# Patient Record
Sex: Male | Born: 1975 | Race: White | Hispanic: No | Marital: Single | Smoking: Current every day smoker
Health system: Southern US, Community
[De-identification: ages and names within clinical notes are randomized; demographics above are authoritative.]

## PROBLEM LIST (undated history)

## (undated) DIAGNOSIS — F419 Anxiety disorder, unspecified: Secondary | ICD-10-CM

## (undated) DIAGNOSIS — S2231XA Fracture of one rib, right side, initial encounter for closed fracture: Secondary | ICD-10-CM

## (undated) DIAGNOSIS — M549 Dorsalgia, unspecified: Secondary | ICD-10-CM

## (undated) DIAGNOSIS — I10 Essential (primary) hypertension: Secondary | ICD-10-CM

## (undated) HISTORY — DX: Fracture of one rib, right side, initial encounter for closed fracture: S22.31XA

---

## 2003-01-15 ENCOUNTER — Emergency Department (HOSPITAL_COMMUNITY): Admission: EM | Admit: 2003-01-15 | Discharge: 2003-01-15 | Payer: Self-pay | Admitting: *Deleted

## 2003-06-19 ENCOUNTER — Ambulatory Visit (HOSPITAL_COMMUNITY): Admission: RE | Admit: 2003-06-19 | Discharge: 2003-06-19 | Payer: Self-pay | Admitting: Internal Medicine

## 2003-06-24 ENCOUNTER — Ambulatory Visit (HOSPITAL_COMMUNITY): Admission: RE | Admit: 2003-06-24 | Discharge: 2003-06-24 | Payer: Self-pay | Admitting: Internal Medicine

## 2003-07-15 ENCOUNTER — Ambulatory Visit (HOSPITAL_COMMUNITY): Admission: RE | Admit: 2003-07-15 | Discharge: 2003-07-15 | Payer: Self-pay | Admitting: Internal Medicine

## 2007-06-13 ENCOUNTER — Inpatient Hospital Stay (HOSPITAL_COMMUNITY): Admission: EM | Admit: 2007-06-13 | Discharge: 2007-06-18 | Payer: Self-pay | Admitting: Emergency Medicine

## 2007-06-18 ENCOUNTER — Ambulatory Visit: Payer: Self-pay | Admitting: Psychiatry

## 2007-06-18 ENCOUNTER — Inpatient Hospital Stay (HOSPITAL_COMMUNITY): Admission: AD | Admit: 2007-06-18 | Discharge: 2007-06-19 | Payer: Self-pay | Admitting: Psychiatry

## 2008-11-25 ENCOUNTER — Emergency Department (HOSPITAL_COMMUNITY): Admission: EM | Admit: 2008-11-25 | Discharge: 2008-11-25 | Payer: Self-pay | Admitting: Emergency Medicine

## 2010-09-20 LAB — CBC
Hemoglobin: 15.7 g/dL (ref 13.0–17.0)
MCHC: 35.4 g/dL (ref 30.0–36.0)
MCV: 99.6 fL (ref 78.0–100.0)
Platelets: 236 10*3/uL (ref 150–400)
RBC: 4.45 MIL/uL (ref 4.22–5.81)
WBC: 6.2 10*3/uL (ref 4.0–10.5)

## 2010-09-20 LAB — DIFFERENTIAL
Basophils Absolute: 0 10*3/uL (ref 0.0–0.1)
Basophils Relative: 0 % (ref 0–1)
Eosinophils Absolute: 0.1 10*3/uL (ref 0.0–0.7)
Eosinophils Relative: 2 % (ref 0–5)
Lymphocytes Relative: 21 % (ref 12–46)
Lymphs Abs: 1.3 10*3/uL (ref 0.7–4.0)
Monocytes Relative: 7 % (ref 3–12)
Neutro Abs: 4.3 10*3/uL (ref 1.7–7.7)
Neutrophils Relative %: 70 % (ref 43–77)

## 2010-09-20 LAB — COMPREHENSIVE METABOLIC PANEL
ALT: 28 U/L (ref 0–53)
AST: 24 U/L (ref 0–37)
Alkaline Phosphatase: 91 U/L (ref 39–117)
CO2: 25 mEq/L (ref 19–32)
Calcium: 9.6 mg/dL (ref 8.4–10.5)
Chloride: 104 mEq/L (ref 96–112)
Creatinine, Ser: 0.89 mg/dL (ref 0.4–1.5)
GFR calc non Af Amer: >60 mL/min (ref >60)
Glucose, Bld: 112 mg/dL — ABNORMAL HIGH (ref 70–99)
Total Bilirubin: 1 mg/dL (ref 0.3–1.2)
Total Protein: 6.4 g/dL (ref 6.0–8.3)

## 2010-09-20 LAB — POCT CARDIAC MARKERS
Myoglobin, poc: 34 ng/mL (ref 12–200)
Troponin i, poc: <0.05 ng/mL (ref 0.00–0.09)

## 2010-10-26 NOTE — H&P (Signed)
NAME:  [name] [name], [name] [name]              ACCOUNT NO.:  [id number]   MEDICAL RECORD NO.:  [id number]          PATIENT TYPE:  INP   LOCATION:  IC03                          FACILITY:  [facility]   PHYSICIAN:  Dorris Singh, DO    DATE OF BIRTH:  12/18/1975   DATE OF ADMISSION:  06/12/2007  DATE OF DISCHARGE:  LH                              HISTORY & PHYSICAL   HISTORY OF PRESENT ILLNESS:  The patient is a 35 year old male who  presented to the emergency room via EMS with the chief complaint of  altered mental status and possible drug overdose.  The patient's history  is that he has family members, particularly his father, who is dying of  cancer and may only have one week to live.  Basically, he has been  taking Xanax which he has been getting off the street to help with  anxiety several times a day.  His girlfriend/fiance states that she has  not seen him actually take any of the medication.  He would leave the  room and actually take some.  He has a history of taking a street Xanax  maybe once a month per her recollection.  When the patient arrived via  EMS, he had a Caveat of 5 because he was unresponsive and had altered  mental status.  While in the ICU, his girlfriend answered most of his  medical questions.   PAST MEDICAL HISTORY:  Unknown.   SOCIAL HISTORY:  As mentioned before in the HPI.   ALLERGIES:  Unknown.   MEDICATIONS:  Currently, other than taking Xanax, dose is unknown at  this point in time.   HOSPITAL COURSE:  While he was in the emergency room, the patient  started to vomit.  He was unable to protect his airway.  Rapid response  and resuscitation except for CPR was implemented.  He was then  intubated, and he came back with a positive toxicology screen of opiates  and benzodiazepines.  Per family, he has had problems with substance  abuse in the past.  He did not improve with the Narcan on route.  He was  then intubated to protect his airway.  He also aspirated.  He  had large  amounts of food in his stomach, and there is a great concern that he may  have aspirated a large amount of this into his lungs due to his  inability of protecting his airway.   PHYSICAL EXAMINATION:  VITAL SIGNS:  Heart rate 100, blood pressure  94/62, respirations on ventilator are 16.  GENERAL:  Well-developed, well-nourished, unresponsive, 35 year old  Caucasian male.  HEENT:  Normocephalic, atraumatic.  Eyes:  Pupils are not reactive.  There is no scleral icterus or conjunctival discharge.  Ears, Nose,  Mouth and Throat are within normal limits.  NECK:  Supple, no lymphadenopathy.  HEART:  Regular rate and rhythm.  No murmurs, rubs or gallops.  RESPIRATIONS:  Currently, the patient is on the ventilator.  ABDOMEN:  Soft, nontender, nondistended.  Bowel sounds present in all  four quadrants.  EXTREMITIES:  Full range of motion.  Pulses in all  four quadrants.  No  edema noted.  NEUROLOGICAL:  Unable to assess.  SKIN:  No visibile rash at this point in time.   STUDIES:  On the tests that were done, he had EKG which showed atrial  fibrillation with rapid ventricular response.  Axis was normal.  PQRS  was within normal limits.  There is no prior EKG.  CT of head study was  somewhat limited with motion artifact.  No intracranial abnormality.  His ABG:  A ph was 7.2, PCO2 68.2, PO2 65, bicarbonate 25.9.  He had two  of these done.  His pH initially was 7.1, so there was improvement in  his next one.  PCO2 80.7, PO2 123, bicarbonate 25.8.  His salicylate  level was less than 4, alcohol level less than 5.  Chemistry is  essentially normal except for his glucose which was 253 and his total  bilirubin which was 1.4.  His acetaminophen level was less than 10.  His  cardiac markers were within normal limits.  His drug screen was positive  for opiates and benzodiazepines.  His urine was within normal limits  except for an elevated glucose of 250.  White count 13.1, hemoglobin   15.5, hematocrit 46.1, platelets 290.   ASSESSMENT/PLAN:  1. Respiratory arrest secondary to benzodiazepines and opiate      overdose.  2. Hyperglycemia.  3. Possible aspiration pneumonia.   PLAN:  Admit the patient to the ICU to the service of [facility].  Will  obtain daily CBC, B-met and ABGs.  Will consult Dr. Juanetta Gosling to consult  and participate regarding possible aspiration pneumonia and respiratory  distress.  Will have the ACT Team come see the patient once he is  extubated.  Will place the patient on patient protocol.  Will also given  him breathing treatments q.4 h around the clock.  Will watch his blood  sugars.  Will keep him n.p.o. and may decide to use supplemental  nutrition if he is on the ventilator longer than 48 hours.  Will  continue to monitor the patient.  The patient currently has a fiance,  but she is not his wife.  His next of kin is his mother.  She is  available to reach by phone if there are any questions that need to be  answered, she is the best person at this point in time to answer them,  and also for any legal decisions that need to be made, she needs to be  contact.  Her number is [phone].      Dorris Singh, DO  Electronically Signed     CB/MEDQ  D:  06/13/2007  T:  06/13/2007  Job:  [id number]

## 2010-10-26 NOTE — Group Therapy Note (Signed)
NAME:  Nie, Ezeriah              ACCOUNT NO.:  [id number]   MEDICAL RECORD NO.:  [id number]          PATIENT TYPE:  INP   LOCATION:  IC03                          FACILITY:  [facility]   PHYSICIAN:  Edward L. Juanetta Gosling, M.D.DATE OF BIRTH:  12/31/1975   DATE OF PROCEDURE:  DATE OF DISCHARGE:                                 PROGRESS NOTE   Mr. Cantave was attempted to be weaned today but it did not work.  He  has no new complaints.  His physical examination shows that his chest is  still with some rhonchi.  His heart is regular.  He has a lot of  secretions.  His blood gas is adequate but he has so much secretions, I  do not think we can safely extubate him today and I have told him that.  So the plan is to continue with his meds and treatments and follow.      Edward L. Juanetta Gosling, M.D.  Electronically Signed     ELH/MEDQ  D:  06/14/2007  T:  06/14/2007  Job:  [id number]

## 2010-10-26 NOTE — Group Therapy Note (Signed)
NAME:  [name], [name]              ACCOUNT NO.:  [id number]   MEDICAL RECORD NO.:  [id number]          PATIENT TYPE:  INP   LOCATION:  IC03                          FACILITY:  [facility]   PHYSICIAN:  Edward L. Juanetta Gosling, M.D.DATE OF BIRTH:  1976-02-14   DATE OF PROCEDURE:  06/15/2007  DATE OF DISCHARGE:                                 PROGRESS NOTE   [name] [name] has done okay.  He is awake.  He still has significant  secretion which may be a problem.  His physical exam otherwise shows  that he is alert.  His O2 saturation is 96%.  Heart rate is 112, blood  pressure 143/72, respirations are 20.  His pupils are reactive.  His  chest with significant rhonchi bilaterally.  His white count is 13,000,  hemoglobin 11.7, platelets 192.  Comprehensive metabolic profile:  BUN  is 10, creatinine is 0.81, albumin is 3.  His blood gas on 40%, 600,  rate of 18, 5 of PEEP, pO2 is 68, pCO2 of 37, pH 7.42.  Chest x-ray  shows improving right lower lobe.   ASSESSMENT:  He has respiratory failure on the basis of a drug overdose  and he has almost certainly aspiration pneumonia.  He is being treated  and may be able to come off the ventilator today.      Edward L. Juanetta Gosling, M.D.  Electronically Signed     ELH/MEDQ  D:  06/15/2007  T:  06/15/2007  Job:  [id number]
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
## 2010-10-26 NOTE — H&P (Signed)
NAME:  [name] [name], [name] [name]              ACCOUNT NO.:  [id number]   MEDICAL RECORD NO.:  [id number]          PATIENT TYPE:  IPS   LOCATION:  0500                          FACILITY:  [facility]   PHYSICIAN:  Geoffery Lyons, M.D.      DATE OF BIRTH:  04-20-1976   DATE OF ADMISSION:  06/18/2007  DATE OF DISCHARGE:  06/19/2007                       PSYCHIATRIC ADMISSION ASSESSMENT   IDENTIFYING INFORMATION:  This is a 35 year old single white male.  This  is an involuntary admission.   HISTORY OF PRESENT ILLNESS:  This patient presented sedated with altered  mental status and respiratory compromise to the emergency room and was  admitted to the medical unit on June 12, 2007 to June 18, 2007.  He reports that he had been at a friend's house, had drank several  beers, developed a headache, took one pain pill, nothing happened and  took a few more pain pills.  Additionally that night he took possibly 3-  4 tablets of Xanax, dose unknown.  Remembers going home with his fiancee  and does not remember anything after that.  He was admitted on June 12, 2007 with respiratory arrest secondary to benzodiazepine and opiate  overdose.  Did require ventilator support and was also diagnosed with  some aspiration pneumonia.  The patient admits that he has been having  some grief over his father but reports that the overdose was entirely  accidental.  Does admit to some casual drug use typically using 2-3  peach colored Xanax per week, usually to get to sleep at night,  drinking beers once or twice a week.  Does not use opiates on a regular  basis.   While hospitalized the patient's father died 06-15-07.  The  patient does endorse quite a bit of grief over his father's death and  has been closely involved in the father's care since the father was  diagnosed with lung cancer in March 2008.  The patient is requesting  discharge to attend his father's funeral today.  Denies any active  suicidal  thoughts.  Denies homicidal thoughts and is willing to consider  counseling and has voiced his intention to remain off substances.   PAST PSYCHIATRIC HISTORY:  First psychiatric inpatient stay.  He has no  prior history of treatment for substance abuse or depression or mood  disorder.  No history of a previous history of blackouts, seizures or  brain injury.  Denies a history of learning disabilities.  Denies any  history of psychotropics.   SOCIAL HISTORY:  Single white male, never married.  No children.  Currently searching for work doing full-time employment.  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  Father deceased June 14, 2022 2009 after being diagnosed with lung cancer in March 2008.  Hospice has  been involved with the family.  The patient has lived at home with his  parents.  His brother also incidentally died a little more than a year  ago due to obesity and cardiac problems.  The patient has no legal  problems.   FAMILY HISTORY:  Denies family  history of mental illness or substance  abuse.   MEDICAL HISTORY:  The patient is currently followed by Dr. Regino Schultze, his  primary care physician, and during hospitalization was seen by Dr. Shaune Pollack, pulmonologist.   MEDICAL PROBLEMS:  Status post benzodiazepine and opiate overdose with  ventilator dependent respiratory failure, aspiration pneumonia.   MEDICATIONS:  Levaquin 750 mg for three more days.   DRUG ALLERGIES:  NONE.   POSITIVE PHYSICAL FINDINGS:  Physical exam done in the emergency room is  noted in the record.  Generally well-nourished, well-developed but at  the time of admission he was an unresponsive 35 year old male.  Notably  urine drug screen was positive for benzodiazepines and opiates.  His  salicylate level was less than 4, alcohol level less than 5 and glucose  was at the time of being checked 253 mg percent which normalized later.  Acetaminophen level was less than 10.  Platelets were  normal at 290,000.  Please refer to the history and physical done by Dr. Dorris Singh at  [facility].  On admission to our unit here he is  fully alert, afebrile with normal vital signs, up and ambulatory and  completely oriented.   MENTAL STATUS EXAM:  A fully alert male, cooperative, completely  oriented.  Affect is appropriate, somewhat blunted.  He is in full  contact with reality.  Cognition fully preserved.  Speech is normal in  pace, tone and amount, production, articulate.  Thinking is linear and  no signs of psychosis or confusion.  Gives a coherent history.  Has been  candid about his social issues with some casual use of Xanax and opiates  along with alcohol.  Mood is neutral.  He has no dangerous thoughts  today.  Regrets what happened.  Is pledging to abstain from illicit  substances.  Requesting to be discharged so that he can attend to his  father's funeral and assist his mother.  We have had a conference with  his mother participating here in person.  She reports that she has never  known him to have any suicidal thoughts.  He has been helpful at home,  never been dangerous in any way.  She would like him home and is in  agreement that she will ensure that he gets to counseling.   AXIS I: Benzodiazepine abuse.  Rule out dependence.  Polysubstance  abuse.  AXIS II: Deferred.  AXIS III: Status post benzodiazepine and opiate overdose, ventilator  dependent respiratory failure resolved and aspiration pneumonia.  AXIS IV: Severe grief issues.  AXIS V: Current 47, past year 39.   PLAN:  To discharge the patient today.  He will go home with his mother  and attend his father's funeral tomorrow.  We are making a referral to  hospice of [facility] for grief counseling and the patient is in  agreement with this plan. He has voiced his commitment to remain  abstinent from street drugs and we have also instructed him to follow up  with Dr. Regino Schultze for  any fever that may occur in the next couple of  weeks.   DISCHARGE MEDICATIONS:  Levaquin 750 mg daily for January 7th and  January 8th and no other medications at this time.   DISCHARGE DIAGNOSIS:  As noted above.      Margaret A. Scott, N.P.      Geoffery Lyons, M.D.  Electronically Signed    MAS/MEDQ  D:  06/19/2007  T:  06/19/2007  Job:  [phone] [id number]
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
## 2010-10-26 NOTE — Discharge Summary (Signed)
NAME:  [name] [name], [name] [name]              ACCOUNT NO.:  [id number]   MEDICAL RECORD NO.:  [id number]          PATIENT TYPE:  INP   LOCATION:  A211                          FACILITY:  [facility]   PHYSICIAN:  Osvaldo Shipper, MD     DATE OF BIRTH:  1976-03-09   DATE OF ADMISSION:  06/13/2007  DATE OF DISCHARGE:  01/05/2009LH                               DISCHARGE SUMMARY   ADDENDUM:  Basically patient was awaiting transfer to a psychiatric  facility to be examined and evaluated by a psychiatrist.  Please review  my dictated discharge summary from yesterday for further details.  Patient continued to do well for the one extra day that he stayed here  at [facility]. This morning his potassium was a little bit low, which we  replaced.  However, I was told by the nurse after the patient went that  this order was never taken off so she was going to attempt to reach the  providers at [facility] to see if they could give him the extra  dose of potassium there.  In any case, he did well, he was more amenable  to being transferred for evaluation.  His mother was also in his room,  and she was also calm and more collected this time when I saw her.  Examination was unremarkable.  He was continuing to saturate 95%, his  lungs were clear, and he continued to be medically stable for discharge.  He was given a prescription for Levaquin 750 mg for three more days.      Osvaldo Shipper, MD  Electronically Signed     GK/MEDQ  D:  06/18/2007  T:  06/19/2007  Job:  [id number]

## 2010-10-26 NOTE — Group Therapy Note (Signed)
NAME:  Mcandrew, Abdikadir              ACCOUNT NO.:  [id number]   MEDICAL RECORD NO.:  [id number]          PATIENT TYPE:  INP   LOCATION:  A211                          FACILITY:  [facility]   PHYSICIAN:  Edward L. Juanetta Gosling, M.D.DATE OF BIRTH:  09-07-1975   DATE OF PROCEDURE:  06/17/2007  DATE OF DISCHARGE:                                 PROGRESS NOTE   ASSESSMENT AND PLAN:  Mr. Jablonsky looks very good and I am going to  plan to sign off at this point.   Thanks for allowing me to see him with you.   Sincerely,      Edward L. Juanetta Gosling, M.D.  Electronically Signed     ELH/MEDQ  D:  06/17/2007  T:  06/17/2007  Job:  [id number]

## 2010-10-29 NOTE — Op Note (Signed)
NAME:  [name] [name], [name] [name]                        ACCOUNT NO.:  [id number]   MEDICAL RECORD NO.:  [id number]                   PATIENT TYPE:  AMB   LOCATION:  DAY                                  FACILITY:  [facility]   PHYSICIAN:  Lionel December, M.D.                 DATE OF BIRTH:  [birth year]   DATE OF PROCEDURE:  06/19/2003  DATE OF DISCHARGE:                                 OPERATIVE REPORT   PROCEDURE:  Total colonoscopy.   ENDOSCOPIST:  Lionel December, M.D.   INDICATIONS:  This patient is a 35 year old Caucasian male with chronic  constipation and left-sided abdominal pain and intermittent hematochezia who  was recently noted to have heme-positive stool.  He also has anorexia and a  25-pound weight loss. I feel that his weight loss is secondary to anxiety  and depression. He did have lab studies through [facility].  His  CBC and TSH were normal.  His ALT was 43 which is slightly elevated.  He is  undergoing diagnostic colonoscopy.   The procedure and risks were reviewed with the patient and informed consent  was obtained.   PREOPERATIVE MEDICATIONS:  Demerol 50 mg IV and Versed 8 mg IV in divided  dose.   FINDINGS:  Procedure performed in endoscopy suite.  The patient's vital  signs and O2 saturation were monitored during the procedure and remained  stable.  The patient was placed in the left lateral recumbent position and  rectal examination was performed.  No external abnormality noted, but on  digital exam he was quite tender, but there was no stricture.   Olympus videoscope was placed in the rectum and advanced under vision into  the sigmoid colon and beyond.  Preparation was satisfactory.  Scope was  passed to the cecum which was identified by appendiceal orifice and the  ileocecal valve.  As the scope was withdrawn, colonic mucosa was carefully  examined and was normal throughout.  Rectal mucosa similarly was normal.   The scope was retroflexed to examine the  anorectal junction and hemorrhoids  were noted below the dentate line, but there was no obvious tear. The  endoscope was straightened and withdrawn.  The patient tolerated the  procedure well.   FINAL DIAGNOSIS:  External hemorrhoids, otherwise normal colonoscopy.   RECOMMENDATIONS:  1. He should continue high fiber diet and Metamucil 1 tablespoonful daily.     He can titrate the dose of MiraLax to maybe 10 gm daily or every other     day.  The goal is for him to have at least 2 or 3 bowel movements a week.  2. Will check his LFTs today along with a CBC and sedimentation rate.  3. I would like for him to make an appointment to see Dr. Regino Schultze to address     the issue of his stress disorder and possibly depression.  I will be  contacting the patient with biopsy results and will plan to see him back     in 6-8 weeks.      ___________________________________________                                            Lionel December, M.D.   NR/MEDQ  D:  06/19/2003  T:  06/19/2003  Job:  [id number]   cc:   Kirk Ruths, M.D.  [address]  [place]  [place] [address]  Fax: [phone]   Barbaraann Barthel, M.D.  [address]  [place]  [place] [address]  Fax: [phone]
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
## 2010-10-29 NOTE — Group Therapy Note (Signed)
NAME:  [name] [name], [name] [name]              ACCOUNT NO.:  [id number]   MEDICAL RECORD NO.:  [id number]          PATIENT TYPE:  INP   LOCATION:  A211                          FACILITY:  [facility]   PHYSICIAN:  Edward L. Juanetta Gosling, M.D.DATE OF BIRTH:  [birth year]   DATE OF PROCEDURE:  06/15/2006  DATE OF DISCHARGE:                                 PROGRESS NOTE   SUBJECTIVE:  [name] [name] was able to be extubated yesterday and seems  to be doing well.  He has no new complaints.  He is still coughing and  still congested.   ASSESSMENT AND PLAN:  The situation with his depression may be getting  worse because his father did die in the last a day or 2.  There are  funeral plans underway, and so forth.  At this point, I think  considering his exam, and so forth, he is okay to move from the  intensive care unit.      Edward L. Juanetta Gosling, M.D.  Electronically Signed     ELH/MEDQ  D:  06/17/2007  T:  06/17/2007  Job:  [id number]

## 2011-03-02 LAB — BLOOD GAS, ARTERIAL
Acid-Base Excess: 0.1
Acid-Base Excess: 0.2
Acid-Base Excess: 0.8
Acid-Base Excess: 1
Bicarbonate: 24
Bicarbonate: 24.3 — ABNORMAL HIGH
Bicarbonate: 25.4 — ABNORMAL HIGH
Bicarbonate: 25.6 — ABNORMAL HIGH
FIO2: 40
FIO2: 40
FIO2: 40
MECHVT: 600
MECHVT: 650
Mode: POSITIVE
O2 Content: 3
O2 Content: 40
O2 Content: 40
O2 Saturation: 93.8
O2 Saturation: 95.1
O2 Saturation: 95.3
O2 Saturation: 96.6
PEEP: 5
PEEP: 5
PEEP: 5
Patient temperature: 37
Patient temperature: 37
Patient temperature: 37
Patient temperature: 37
RATE: 18
RATE: 18
TCO2: 21.7
TCO2: 21.8
TCO2: 22.9
TCO2: 23.2
pCO2 arterial: 37.5
pCO2 arterial: 39.4
pCO2 arterial: 42.7
pCO2 arterial: 45.7 — ABNORMAL HIGH
pH, Arterial: 7.366
pH, Arterial: 7.392
pH, Arterial: 7.407
pH, Arterial: 7.422
pO2, Arterial: 68.1 — ABNORMAL LOW
pO2, Arterial: 73.7 — ABNORMAL LOW
pO2, Arterial: 78.5 — ABNORMAL LOW
pO2, Arterial: 86

## 2011-03-02 LAB — DIFFERENTIAL
Basophils Absolute: 0
Basophils Absolute: 0
Basophils Absolute: 0
Basophils Relative: 0
Basophils Relative: 0
Basophils Relative: 0
Eosinophils Absolute: 0
Eosinophils Absolute: 0
Eosinophils Absolute: 0.2
Eosinophils Relative: 0
Eosinophils Relative: 0
Eosinophils Relative: 2
Lymphocytes Relative: 11 — ABNORMAL LOW
Lymphocytes Relative: 7 — ABNORMAL LOW
Lymphocytes Relative: 9 — ABNORMAL LOW
Lymphs Abs: 1
Lymphs Abs: 1.2
Lymphs Abs: 1.3
Monocytes Absolute: 1
Monocytes Absolute: 1
Monocytes Absolute: 1.1 — ABNORMAL HIGH
Monocytes Relative: 8
Monocytes Relative: 8
Monocytes Relative: 8
Neutro Abs: 10.8 — ABNORMAL HIGH
Neutro Abs: 11.8 — ABNORMAL HIGH
Neutro Abs: 9.2 — ABNORMAL HIGH
Neutrophils Relative %: 79 — ABNORMAL HIGH
Neutrophils Relative %: 83 — ABNORMAL HIGH
Neutrophils Relative %: 85 — ABNORMAL HIGH

## 2011-03-02 LAB — URINALYSIS, ROUTINE W REFLEX MICROSCOPIC
Bilirubin Urine: NEGATIVE
Ketones, ur: NEGATIVE
Protein, ur: NEGATIVE
Urobilinogen, UA: >8 — ABNORMAL HIGH

## 2011-03-02 LAB — BASIC METABOLIC PANEL
BUN: 11
BUN: 5 — ABNORMAL LOW
BUN: 6
BUN: 8
CO2: 25
CO2: 26
CO2: 26
CO2: 27
Calcium: 8.6
Calcium: 8.9
Calcium: 9
Calcium: 9.4
Chloride: 107
Chloride: 109
Chloride: 111
Chloride: 111
Creatinine, Ser: 0.75
Creatinine, Ser: 0.8
Creatinine, Ser: 0.86
Creatinine, Ser: 0.91
GFR calc Af Amer: >60
GFR calc Af Amer: >60
GFR calc Af Amer: >60
GFR calc Af Amer: >60
GFR calc non Af Amer: >60
GFR calc non Af Amer: >60
GFR calc non Af Amer: >60
GFR calc non Af Amer: >60
Glucose, Bld: 102 — ABNORMAL HIGH
Glucose, Bld: 109 — ABNORMAL HIGH
Glucose, Bld: 110 — ABNORMAL HIGH
Glucose, Bld: 114 — ABNORMAL HIGH
Potassium: 3.3 — ABNORMAL LOW
Potassium: 3.4 — ABNORMAL LOW
Potassium: 3.5
Potassium: 3.8
Sodium: 140
Sodium: 141
Sodium: 142
Sodium: 143

## 2011-03-02 LAB — COMPREHENSIVE METABOLIC PANEL
ALT: 21
AST: 18
Albumin: 3 — ABNORMAL LOW
Alkaline Phosphatase: 54
BUN: 10
CO2: 25
Calcium: 8.9
Chloride: 110
Creatinine, Ser: 0.81
GFR calc Af Amer: >60
GFR calc non Af Amer: >60
Glucose, Bld: 104 — ABNORMAL HIGH
Potassium: 3.6
Sodium: 142
Total Bilirubin: 3 — ABNORMAL HIGH
Total Protein: 5.6 — ABNORMAL LOW

## 2011-03-02 LAB — CBC
HCT: 34.5 — ABNORMAL LOW
HCT: 37.2 — ABNORMAL LOW
HCT: 38.2 — ABNORMAL LOW
Hemoglobin: 11.7 — ABNORMAL LOW
Hemoglobin: 12.4 — ABNORMAL LOW
Hemoglobin: 12.9 — ABNORMAL LOW
MCHC: 33.4
MCHC: 33.8
MCHC: 33.9
MCV: 98.1
MCV: 98.2
MCV: 98.3
Platelets: 192
Platelets: 209
Platelets: 231
RBC: 3.52 — ABNORMAL LOW
RBC: 3.78 — ABNORMAL LOW
RBC: 3.89 — ABNORMAL LOW
RDW: 13.3
RDW: 13.5
RDW: 13.5
WBC: 11.8 — ABNORMAL HIGH
WBC: 13 — ABNORMAL HIGH
WBC: 13.9 — ABNORMAL HIGH

## 2011-03-02 LAB — URINE MICROSCOPIC-ADD ON

## 2011-03-18 LAB — COMPREHENSIVE METABOLIC PANEL
ALT: 42
AST: 33
Albumin: 4.3
Alkaline Phosphatase: 63
CO2: 28
Calcium: 8.5
Chloride: 108
Creatinine, Ser: 1.04
Glucose, Bld: 146 — ABNORMAL HIGH
Glucose, Bld: 253 — ABNORMAL HIGH
Potassium: 3.7
Potassium: 4.5
Sodium: 140
Sodium: 140
Total Bilirubin: 1.4 — ABNORMAL HIGH
Total Protein: 5.6 — ABNORMAL LOW
Total Protein: 6.8

## 2011-03-18 LAB — URINE CULTURE
Colony Count: NO GROWTH
Culture: NO GROWTH

## 2011-03-18 LAB — POCT CARDIAC MARKERS
CKMB, poc: 1.2
Myoglobin, poc: 83.3
Operator id: 216221

## 2011-03-18 LAB — BLOOD GAS, ARTERIAL
Acid-Base Excess: 1.1
Acid-base deficit: 0
Acid-base deficit: 1.4
Acid-base deficit: 2.7 — ABNORMAL HIGH
Bicarbonate: 24.1 — ABNORMAL HIGH
Bicarbonate: 25.9 — ABNORMAL HIGH
Bicarbonate: 27.8 — ABNORMAL HIGH
FIO2: 0.7
FIO2: 100
FIO2: 100
FIO2: 100
O2 Saturation: 89.3
O2 Saturation: 99.5
PEEP: 5
PEEP: 5
PEEP: 5
RATE: 12
RATE: 18
TCO2: 21.5
pCO2 arterial: 44.9
pCO2 arterial: 80.7
pO2, Arterial: 122 — ABNORMAL HIGH
pO2, Arterial: 123 — ABNORMAL HIGH
pO2, Arterial: 261 — ABNORMAL HIGH
pO2, Arterial: 65.5 — ABNORMAL LOW

## 2011-03-18 LAB — DIFFERENTIAL
Eosinophils Relative: 3
Lymphocytes Relative: 31
Lymphs Abs: 4.1 — ABNORMAL HIGH
Monocytes Absolute: 1
Monocytes Relative: 8
Neutro Abs: 7.6

## 2011-03-18 LAB — URINALYSIS, ROUTINE W REFLEX MICROSCOPIC
Bilirubin Urine: NEGATIVE
Ketones, ur: NEGATIVE
Nitrite: NEGATIVE
Protein, ur: NEGATIVE
pH: 6.5

## 2011-03-18 LAB — CBC
HCT: 46.1
Hemoglobin: 15.5
MCHC: 33.5
RBC: 4.7

## 2011-03-18 LAB — RAPID URINE DRUG SCREEN, HOSP PERFORMED
Amphetamines: NOT DETECTED
Benzodiazepines: POSITIVE — AB
Cocaine: NOT DETECTED
Opiates: POSITIVE — AB
Tetrahydrocannabinol: NOT DETECTED

## 2011-03-18 LAB — APTT: aPTT: 26

## 2011-03-18 LAB — ACETAMINOPHEN LEVEL: Acetaminophen (Tylenol), Serum: <10 — ABNORMAL LOW

## 2011-04-16 ENCOUNTER — Emergency Department (HOSPITAL_COMMUNITY): Payer: Self-pay

## 2011-04-16 ENCOUNTER — Emergency Department (HOSPITAL_COMMUNITY)
Admission: EM | Admit: 2011-04-16 | Discharge: 2011-04-16 | Disposition: A | Discharge status: Home / Self Care | Payer: Self-pay | Attending: Emergency Medicine | Admitting: Emergency Medicine

## 2011-04-16 ENCOUNTER — Other Ambulatory Visit: Payer: Self-pay

## 2011-04-16 DIAGNOSIS — R110 Nausea: Secondary | ICD-10-CM | POA: Insufficient documentation

## 2011-04-16 DIAGNOSIS — R000 Tachycardia, unspecified: Secondary | ICD-10-CM | POA: Insufficient documentation

## 2011-04-16 DIAGNOSIS — F419 Anxiety disorder, unspecified: Secondary | ICD-10-CM

## 2011-04-16 DIAGNOSIS — R0602 Shortness of breath: Secondary | ICD-10-CM | POA: Insufficient documentation

## 2011-04-16 DIAGNOSIS — R259 Unspecified abnormal involuntary movements: Secondary | ICD-10-CM | POA: Insufficient documentation

## 2011-04-16 DIAGNOSIS — R079 Chest pain, unspecified: Secondary | ICD-10-CM | POA: Insufficient documentation

## 2011-04-16 DIAGNOSIS — I10 Essential (primary) hypertension: Secondary | ICD-10-CM | POA: Insufficient documentation

## 2011-04-16 DIAGNOSIS — F17200 Nicotine dependence, unspecified, uncomplicated: Secondary | ICD-10-CM | POA: Insufficient documentation

## 2011-04-16 DIAGNOSIS — F411 Generalized anxiety disorder: Secondary | ICD-10-CM | POA: Insufficient documentation

## 2011-04-16 HISTORY — DX: Essential (primary) hypertension: I10

## 2011-04-16 HISTORY — DX: Anxiety disorder, unspecified: F41.9

## 2011-04-16 LAB — CBC
HCT: 49.9 % (ref 39.0–52.0)
MCHC: 33.9 g/dL (ref 30.0–36.0)
MCV: 98.2 fL (ref 78.0–100.0)
RDW: 13.6 % (ref 11.5–15.5)
WBC: 6.8 10*3/uL (ref 4.0–10.5)

## 2011-04-16 LAB — BASIC METABOLIC PANEL
BUN: 10 mg/dL (ref 6–23)
Chloride: 99 mEq/L (ref 96–112)
Creatinine, Ser: 0.91 mg/dL (ref 0.50–1.35)
GFR calc Af Amer: >90 mL/min (ref >90)
Glucose, Bld: 121 mg/dL — ABNORMAL HIGH (ref 70–99)

## 2011-04-16 MED ORDER — LORAZEPAM 1 MG PO TABS: 1.0000 mg | ORAL_TABLET | Freq: Three times a day (TID) | ORAL | Status: AC | PRN

## 2011-04-16 MED ORDER — IOHEXOL 300 MG/ML  SOLN
100.0000 mL | Freq: Once | INTRAMUSCULAR | Status: AC | PRN
  Administered 2011-04-16: 100 mL via INTRAVENOUS

## 2011-04-16 MED ORDER — SODIUM CHLORIDE 0.9 % IV BOLUS (SEPSIS)
1000.0000 mL | Freq: Once | INTRAVENOUS | Status: AC
  Administered 2011-04-16: 1000 mL via INTRAVENOUS

## 2011-04-16 NOTE — ED Notes (Signed)
Pt currently denies CP. States his heart was racing earlier and that he could see his heart beat through his shirt. States his brother died of a heart attack close to the same age. NAD at this time. Pt appears anxious. NAD. SO at bedside.

## 2011-04-16 NOTE — ED Provider Notes (Signed)
History   This chart was scribed for Dayton Bailiff, MD by Clarita Crane. The patient was seen in room APA04/APA04 and the patient's care was started at 5:07PM.   CSN: [id number] Arrival date & time: 04/16/2011  4:48 PM   First MD Initiated Contact with Patient 04/16/11 1700      Chief Complaint  Patient presents with  . Chest Pain  . Tachycardia  . Shortness of Breath  . Dizziness  . Nausea  . Hypertension    HPI [name] [name] is a 35 y.o. male who presents to the Emergency Department complaining of constant moderate non-radaiting chest pain with associated SOB, palpitations, dizziness, nausea and tremor of bilateral hands onset several minutes just prior to arrival but currently resolved. Patient reports having h/o anxiety which is currently controlled with Xanax. States he took Xanax 30 mins ago with symptoms gradually improving since. Patient denies elicit drug use today but notes he injected oxycodone into left forearm several days ago. Patient is a current smoker and has a h/o hypertension.  Reports similar sx in past  PCP- [facility]  Past Medical History  Diagnosis Date  . Hypertension   . Anxiety     History reviewed. No pertinent past surgical history.  History reviewed. No pertinent family history.  History  Substance Use Topics  . Smoking status: Current Everyday Smoker -- 0.5 packs/day  . Smokeless tobacco: Not on file  . Alcohol Use: Yes     occ      Review of Systems 10 Systems reviewed and are negative for acute change except as noted in the HPI.  Allergies  Review of patient's allergies indicates no known allergies.  Home Medications   Current Outpatient Rx  Name Route Sig Dispense Refill  . ALPRAZOLAM 1 MG PO TABS Oral Take 1 mg by mouth daily.      . OXYCODONE HCL 5 MG PO TABS Oral Take 5 mg by mouth daily.      Marland Kitchen LORAZEPAM 1 MG PO TABS Oral Take 1 tablet (1 mg total) by mouth 3 (three) times daily as needed for anxiety. 15 tablet 0    BP  144/81  Pulse 71  Temp(Src) 97.3 F (36.3 C) (Oral)  Resp 16  Ht 6' (1.829 m)  Wt 190 lb (86.183 kg)  BMI 25.77 kg/m2  SpO2 100%  Physical Exam  Nursing note and vitals reviewed. Constitutional: He is oriented to person, place, and time. He appears well-developed and well-nourished. No distress.  HENT:  Head: Normocephalic and atraumatic.  Eyes: Conjunctivae and EOM are normal. Pupils are equal, round, and reactive to light.  Neck: Phonation normal. Neck supple. No tracheal deviation present.  Cardiovascular: Normal rate, regular rhythm and normal heart sounds.   Pulmonary/Chest: Effort normal and breath sounds normal. No respiratory distress. He exhibits no bony tenderness.  Abdominal: Soft. Normal appearance. He exhibits no distension.  Musculoskeletal: Normal range of motion. He exhibits no edema.       Bruise noted to left forearm from patient reported injection.   Neurological: He is alert and oriented to person, place, and time. He has normal strength. No sensory deficit.  Skin: Skin is warm, dry and intact.  Psychiatric: He has a normal mood and affect. His behavior is normal.    ED Course  Procedures (including critical care time)  DIAGNOSTIC STUDIES: Oxygen Saturation is 100% on room air, normal by my interpretation.    COORDINATION OF CARE: 6:11PM- Patient informed of current lab and imaging  results and intent to obtain additional imaging to rule out blood clot.    Date: 04/16/2011  Rate: 96  Rhythm: normal sinus rhythm and with sinus arrhythmia  QRS Axis: normal  Intervals: normal  ST/T Wave abnormalities: normal  Conduction Disutrbances:none  Narrative Interpretation:   Old EKG Reviewed: unchanged  Labs Reviewed  BASIC METABOLIC PANEL - Abnormal; Notable for the following:    Glucose, Bld 121 (*)    Calcium 11.0 (*)    All other components within normal limits  D-DIMER, QUANTITATIVE - Abnormal; Notable for the following:    D-Dimer, Quant 1.31 (*)     All other components within normal limits  CBC  POCT I-STAT TROPONIN I   Dg Chest 2 View  04/16/2011  *RADIOLOGY REPORT*  Clinical Data: Chest pain  CHEST - 2 VIEW  Comparison: 06/16/2007  Findings: Artifact overlies the chest.  Heart size is normal. Mediastinal shadows are normal.  Lungs are clear.  No effusions. No significant bony findings.  IMPRESSION: Normal chest  Original Report Authenticated By: Thomasenia Sales, M.D.   Ct Angio Chest W/cm &/or Wo Cm  04/16/2011  *RADIOLOGY REPORT*  Clinical Data:  Shortness of breath.  Positive D-dimer.  CT ANGIOGRAPHY CHEST WITH CONTRAST  Technique:  Multidetector CT imaging of the chest was performed using the standard protocol during bolus administration of intravenous contrast.  Multiplanar CT image reconstructions including MIPs were obtained to evaluate the vascular anatomy.  Contrast: OMNIPAQUE IOHEXOL 300 MG/ML IV SOLN  Comparison:  Chest radiography same day  Findings:  Pulmonary arterial opacification is excellent.  There are no filling defects.  No aortic pathology is seen.  No coronary artery calcification.  No mediastinal or hilar mass or adenopathy. No pleural or pericardial fluid.  No pulmonary infiltrate, mass or collapse.  There are a few small areas of emphysema.  No significant bony finding.  Review of the MIP images confirms the above findings.  IMPRESSION: No pulmonary emboli.  No active chest disease.  Few small areas of emphysema.  Original Report Authenticated By: Thomasenia Sales, M.D.     1. Chest pain   2. Anxiety       MDM  Patient's chest pain and overall symptom complex is consistent with anxiety or panic attack. I'm not concerned about ACS is completely atypical. Therefore a troponin was not sent. He was tachycardic on arrival with the complaint of shortness of breath and chest pain therefore I performed a d-dimer as I could not use the rule out criteria. A d-dimer was elevated therefore he received a CT angioma which was  negative for PE. Remainder of his workup was unremarkable. His symptoms resolved after taking his Xanax at home prior to arrival in the emergency department. Patient remained asymptomatic one emergency department. He has limited access to his primary care physician therefore I will prescribe a limited prescription of Ativan. This      I personally performed the services described in this documentation, which was scribed in my presence. The recorded information has been reviewed and considered.   Dayton Bailiff, MD 04/16/11 [phone]

## 2011-04-16 NOTE — ED Notes (Signed)
MD at bedside. 

## 2011-04-16 NOTE — ED Notes (Signed)
Pt presents with chest pain, SOB, tachycardia, headache, and nausea. Pt states symptoms started yesterday. Resp even and unlabored. NAD at this time.

## 2012-09-13 ENCOUNTER — Encounter (HOSPITAL_COMMUNITY): Payer: Self-pay | Admitting: *Deleted

## 2012-09-13 ENCOUNTER — Emergency Department (HOSPITAL_COMMUNITY)
Admission: EM | Admit: 2012-09-13 | Discharge: 2012-09-13 | Disposition: A | Discharge status: Home / Self Care | Payer: Self-pay | Attending: Emergency Medicine | Admitting: Emergency Medicine

## 2012-09-13 DIAGNOSIS — T401X1A Poisoning by heroin, accidental (unintentional), initial encounter: Secondary | ICD-10-CM

## 2012-09-13 DIAGNOSIS — I10 Essential (primary) hypertension: Secondary | ICD-10-CM | POA: Insufficient documentation

## 2012-09-13 DIAGNOSIS — F17200 Nicotine dependence, unspecified, uncomplicated: Secondary | ICD-10-CM | POA: Insufficient documentation

## 2012-09-13 DIAGNOSIS — T401X4A Poisoning by heroin, undetermined, initial encounter: Secondary | ICD-10-CM | POA: Insufficient documentation

## 2012-09-13 DIAGNOSIS — Z79899 Other long term (current) drug therapy: Secondary | ICD-10-CM | POA: Insufficient documentation

## 2012-09-13 DIAGNOSIS — Y939 Activity, unspecified: Secondary | ICD-10-CM | POA: Insufficient documentation

## 2012-09-13 DIAGNOSIS — Y929 Unspecified place or not applicable: Secondary | ICD-10-CM | POA: Insufficient documentation

## 2012-09-13 DIAGNOSIS — R000 Tachycardia, unspecified: Secondary | ICD-10-CM | POA: Insufficient documentation

## 2012-09-13 DIAGNOSIS — F411 Generalized anxiety disorder: Secondary | ICD-10-CM | POA: Insufficient documentation

## 2012-09-13 MED ORDER — SODIUM CHLORIDE 0.9 % IV BOLUS (SEPSIS)
500.0000 mL | Freq: Once | INTRAVENOUS | Status: AC
  Administered 2012-09-13: 500 mL via INTRAVENOUS

## 2012-09-13 MED ORDER — SODIUM CHLORIDE 0.9 % IV SOLN
INTRAVENOUS | Status: DC
  Administered 2012-09-13: 1000 mL via INTRAVENOUS

## 2012-09-13 NOTE — ED Notes (Signed)
Pt denies SI/HI 
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
## 2012-09-13 NOTE — ED Notes (Signed)
Pt states he has been using heroin today. Pt was initially naganol for ~ 5 min, needing ventilation with BVM. Pt woke up upon insertion of IO. Pt alert and oriented on arrival, able to answer questions appropriately during triage.

## 2012-09-13 NOTE — ED Notes (Signed)
Pt given urinal at this time 

## 2012-09-13 NOTE — ED Notes (Signed)
Pt alert & oriented x4, stable gait. Patient given discharge instructions, paperwork & prescription(s). Patient  instructed to stop at the registration desk to finish any additional paperwork. Patient verbalized understanding. Pt left department w/ no further questions. 

## 2012-09-13 NOTE — ED Notes (Signed)
Pt alert talking w/ family. Pt remains on cardiac monitor w/ NIBP monitoring. NAD noted.

## 2013-05-18 ENCOUNTER — Emergency Department (HOSPITAL_COMMUNITY): Payer: Self-pay

## 2013-05-18 ENCOUNTER — Inpatient Hospital Stay (HOSPITAL_COMMUNITY)
Admission: EM | Admit: 2013-05-18 | Discharge: 2013-05-19 | DRG: 195 | Disposition: A | Discharge status: Home / Self Care | Payer: Self-pay | Attending: Internal Medicine | Admitting: Internal Medicine

## 2013-05-18 ENCOUNTER — Encounter (HOSPITAL_COMMUNITY): Payer: Self-pay | Admitting: Emergency Medicine

## 2013-05-18 DIAGNOSIS — I10 Essential (primary) hypertension: Secondary | ICD-10-CM | POA: Diagnosis present

## 2013-05-18 DIAGNOSIS — R000 Tachycardia, unspecified: Secondary | ICD-10-CM | POA: Diagnosis present

## 2013-05-18 DIAGNOSIS — F17200 Nicotine dependence, unspecified, uncomplicated: Secondary | ICD-10-CM | POA: Diagnosis present

## 2013-05-18 DIAGNOSIS — J189 Pneumonia, unspecified organism: Principal | ICD-10-CM | POA: Diagnosis present

## 2013-05-18 DIAGNOSIS — R0600 Dyspnea, unspecified: Secondary | ICD-10-CM

## 2013-05-18 DIAGNOSIS — J40 Bronchitis, not specified as acute or chronic: Secondary | ICD-10-CM | POA: Diagnosis present

## 2013-05-18 DIAGNOSIS — F411 Generalized anxiety disorder: Secondary | ICD-10-CM | POA: Diagnosis present

## 2013-05-18 DIAGNOSIS — Z720 Tobacco use: Secondary | ICD-10-CM | POA: Diagnosis present

## 2013-05-18 DIAGNOSIS — I498 Other specified cardiac arrhythmias: Secondary | ICD-10-CM | POA: Diagnosis present

## 2013-05-18 LAB — CBC WITH DIFFERENTIAL/PLATELET
Basophils Absolute: 0 10*3/uL (ref 0.0–0.1)
Eosinophils Relative: 0 % (ref 0–5)
HCT: 45.3 % (ref 39.0–52.0)
Hemoglobin: 15.3 g/dL (ref 13.0–17.0)
Lymphocytes Relative: 6 % — ABNORMAL LOW (ref 12–46)
Lymphs Abs: 0.7 10*3/uL (ref 0.7–4.0)
MCV: 99.1 fL (ref 78.0–100.0)
Monocytes Absolute: 0.5 10*3/uL (ref 0.1–1.0)
Monocytes Relative: 4 % (ref 3–12)
RDW: 13.4 % (ref 11.5–15.5)
WBC: 11.7 10*3/uL — ABNORMAL HIGH (ref 4.0–10.5)

## 2013-05-18 LAB — BASIC METABOLIC PANEL
BUN: 6 mg/dL (ref 6–23)
CO2: 27 mEq/L (ref 19–32)
Calcium: 9.8 mg/dL (ref 8.4–10.5)
Chloride: 102 mEq/L (ref 96–112)
Creatinine, Ser: 0.87 mg/dL (ref 0.50–1.35)
Glucose, Bld: 131 mg/dL — ABNORMAL HIGH (ref 70–99)

## 2013-05-18 MED ORDER — ALBUTEROL SULFATE (5 MG/ML) 0.5% IN NEBU
5.0000 mg | INHALATION_SOLUTION | Freq: Once | RESPIRATORY_TRACT | Status: AC
Start: 2013-05-18 — End: 2013-05-18
  Administered 2013-05-18: 5 mg via RESPIRATORY_TRACT
  Filled 2013-05-18: qty 1

## 2013-05-18 MED ORDER — IOHEXOL 350 MG/ML SOLN
100.0000 mL | Freq: Once | INTRAVENOUS | Status: AC | PRN
  Administered 2013-05-18: 100 mL via INTRAVENOUS

## 2013-05-18 MED ORDER — AZITHROMYCIN 250 MG PO TABS
500.0000 mg | ORAL_TABLET | Freq: Once | ORAL | Status: AC
  Administered 2013-05-18: 500 mg via ORAL
  Filled 2013-05-18: qty 2

## 2013-05-18 MED ORDER — DEXTROSE 5 % IV SOLN
1.0000 g | Freq: Once | INTRAVENOUS | Status: AC
  Administered 2013-05-18: 1 g via INTRAVENOUS
  Filled 2013-05-18: qty 10

## 2013-05-18 MED ORDER — IPRATROPIUM BROMIDE 0.02 % IN SOLN
0.5000 mg | Freq: Once | RESPIRATORY_TRACT | Status: AC
  Administered 2013-05-18: 0.5 mg via RESPIRATORY_TRACT
  Filled 2013-05-18: qty 2.5

## 2013-05-18 MED ORDER — SODIUM CHLORIDE 0.9 % IV BOLUS (SEPSIS)
1000.0000 mL | Freq: Once | INTRAVENOUS | Status: AC
  Administered 2013-05-18: 1000 mL via INTRAVENOUS

## 2013-05-18 NOTE — ED Notes (Signed)
Upper resp illness for 2 days, feels sob, chills, fever

## 2013-05-18 NOTE — ED Provider Notes (Signed)
37 year old male who abuses tobacco, history of heroin abuse as well, presents with complaint of shortness of breath and coughing. He states that several days ago he developed sinus congestion and drainage, yesterday to history of nasal congestion and drainage and chest congestion. Today he states that he has been having shortness of breath, significant increased amount of coughing, on exam he has diffuse mild expiratory wheezing, speaks in full sentences but has a mild tachypnea, no focal rales with inspiration, no peripheral edema and a soft abdomen. He is slightly tachycardic to 110 beats per minute, oropharynx is clear and moist but erythematous, nasal passages with swollen turbinates and clear rhinorrhea. He has no focal sinus tenderness, very supple neck with no lymphadenopathy, no fever on vital signs. He will need albuterol nebulizer, chest x-ray, recheck vital signs, Rocephin, Zithromax.  Medical screening examination/treatment/procedure(s) were conducted as a shared visit with non-physician practitioner(s) and myself.  I personally evaluated the patient during the encounter.   CT scan confirms community-acquired pneumonia, patient required admission to the hospital secondary to ongoing tachycardia and dyspnea. Antibiotics given      Vida Roller, MD 05/21/13 0100

## 2013-05-18 NOTE — ED Provider Notes (Signed)
CSN: [id number]     Arrival date & time 05/18/13  2038 History   First MD Initiated Contact with Patient 05/18/13 2053     Chief Complaint  Patient presents with  . Shortness of Breath   (Consider location/radiation/quality/duration/timing/severity/associated sxs/prior Treatment) Patient is a 37 y.o. male presenting with shortness of breath.  Shortness of Breath Associated symptoms: cough, ear pain (with popping/crackling in ears bilaterally), headaches, vomiting and wheezing   Associated symptoms: no abdominal pain, no chest pain, no rash and no sore throat    37 yo male current smoker with 5 pack yr hx presents with shortness of breath x 1 day. Patient admits to 2days of productive cough with yellow sputum, congestion, sinus drainage. Reports subjective fever/chills. Patient taken ibuprofen and Tylenol cold/sinus with little symptom relief. Patient admits to recent sick contact. Denies chest pain but states he is sore from coughing.   Past Medical History  Diagnosis Date  . Hypertension   . Anxiety    History reviewed. No pertinent past surgical history. History reviewed. No pertinent family history. History  Substance Use Topics  . Smoking status: Current Every Day Smoker -- 0.50 packs/day  . Smokeless tobacco: Not on file  . Alcohol Use: Yes     Comment: occ    Review of Systems  HENT: Positive for ear pain (with popping/crackling in ears bilaterally), postnasal drip, rhinorrhea and sinus pressure. Negative for sore throat.   Respiratory: Positive for cough, shortness of breath and wheezing.   Cardiovascular: Negative for chest pain and palpitations.  Gastrointestinal: Positive for nausea and vomiting. Negative for abdominal pain.  Genitourinary: Negative for dysuria, hematuria and flank pain.  Skin: Negative for rash.  Neurological: Positive for headaches.  Psychiatric/Behavioral: The patient is nervous/anxious.     Allergies  Review of patient's allergies indicates no  known allergies.  Home Medications   No current outpatient prescriptions on file. BP 124/74  Pulse 103  Temp(Src) 99 F (37.2 C) (Oral)  Resp 20  Ht 6' (1.829 m)  Wt 180 lb 8.9 oz (81.9 kg)  BMI 24.48 kg/m2  SpO2 93% Physical Exam  Nursing note and vitals reviewed. Constitutional: He is oriented to person, place, and time. He appears well-developed and well-nourished. No distress.  HENT:  Head: Normocephalic and atraumatic.  Right Ear: Ear canal normal. Tympanic membrane is not injected, not erythematous and not bulging. A middle ear effusion (Clear) is present.  Left Ear: Ear canal normal. Tympanic membrane is not injected, not erythematous and not bulging. A middle ear effusion (Purulent) is present.  Nose: Mucosal edema present.  Mouth/Throat: Uvula is midline. Posterior oropharyngeal erythema present. No oropharyngeal exudate or posterior oropharyngeal edema.  Eyes: Conjunctivae and EOM are normal. Pupils are equal, round, and reactive to light.  Neck: Normal range of motion. Neck supple.  Cardiovascular: Regular rhythm.  Tachycardia present.  Exam reveals no gallop and no friction rub.   No murmur heard. Pulmonary/Chest: Tachypnea noted. He has wheezes. He has rales in the left lower field.  Musculoskeletal: Normal range of motion. He exhibits no edema.  Lymphadenopathy:    He has no cervical adenopathy.  Neurological: He is alert and oriented to person, place, and time.  Skin: Skin is warm and dry. He is not diaphoretic.  Psychiatric: His mood appears anxious.    ED Course  Procedures (including critical care time) Labs Review Labs Reviewed  CBC WITH DIFFERENTIAL - Abnormal; Notable for the following:    WBC 11.7 (*)  Neutrophils Relative % 90 (*)    Neutro Abs 10.5 (*)    Lymphocytes Relative 6 (*)    All other components within normal limits  BASIC METABOLIC PANEL - Abnormal; Notable for the following:    Glucose, Bld 131 (*)    All other components within  normal limits  INFLUENZA PANEL BY PCR - Abnormal; Notable for the following:    Influenza A By PCR NEGATIVE (*)    Influenza B By PCR NEGATIVE (*)    H1N1 flu by pcr NEGATIVE (*)    All other components within normal limits  COMPREHENSIVE METABOLIC PANEL - Abnormal; Notable for the following:    Glucose, Bld 114 (*)    Total Bilirubin 1.5 (*)    All other components within normal limits  CBC - Abnormal; Notable for the following:    WBC 12.6 (*)    RBC 4.13 (*)    All other components within normal limits  LEGIONELLA ANTIGEN, URINE  STREP PNEUMONIAE URINARY ANTIGEN  TSH  HIV ANTIBODY (ROUTINE TESTING)   Imaging Review Dg Chest 2 View  05/18/2013   CLINICAL DATA:  Cough and congestion.  EXAM: CHEST  2 VIEW  COMPARISON:  04/16/2011  FINDINGS: The heart size and mediastinal contours are within normal limits. Both lungs are clear. Mild degenerative endplate changes in the thoracic spine.  IMPRESSION: No active cardiopulmonary disease.   Electronically Signed   By: Richarda Overlie M.D.   On: 05/18/2013 21:39   Ct Angio Chest Pe W/cm &/or Wo Cm  05/18/2013   CLINICAL DATA:  Upper respiratory illness for 2 days, shortness of breath and chills and fever. History of deep vein thrombosis and tachycardia.  EXAM: CT ANGIOGRAPHY CHEST WITH CONTRAST  TECHNIQUE: Multidetector CT imaging of the chest was performed using the standard protocol during bolus administration of intravenous contrast. Multiplanar CT image reconstructions including MIPs were obtained to evaluate the vascular anatomy.  CONTRAST:  OMNIPAQUE IOHEXOL 350 MG/ML SOLN  COMPARISON:  Chest radiograph May 18, 2013 and CT of the chest April 16, 2011.  FINDINGS: Suboptimal opacification of the pulmonary arteries may limit evaluation. However, the main pulmonary artery is not enlarged, no definite pulmonary arterial filling defects the level at least the subsegmental branches.  Minimal ground-glass opacities in the left upper lobe,  associated with mild bronchial wall thickening. Patchy ground-glass opacities in the right middle lobe, and left lung base. No pleural effusions. No pulmonary masses. Mild unintended patient motion degrades sensitivity for pulmonary nodules. Tracheobronchial tree is patent, no pneumothorax.  Heart and pericardium are unremarkable. Thoracic aorta is normal course and caliber, unremarkable. No lymphadenopathy by CT size criteria. Thoracic esophagus is unremarkable. Included view of the liver is not suspicious ; mild fatty infiltration about the falciform ligament. 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 Scattered Schmorl's nodes.  Review of the MIP images confirms the above findings.  IMPRESSION: Suboptimal bolus timing with no convincing evidence of pulmonary embolism.  Mild bronchial wall thickening with patchy airspace opacities concerning for bronchitis and superimposed pneumonia.   Electronically Signed   By: Awilda Metro   On: 05/18/2013 23:42    EKG Interpretation    Date/Time:    Ventricular Rate:    PR Interval:    QRS Duration:   QT Interval:    QTC Calculation:   R Axis:     Text Interpretation:              MDM   1.  Dyspnea   2. Tachycardia   3. CAP (community acquired pneumonia)   4. Sinus tachycardia   5. Tobacco abuse    Xray negative for any acute abnormalites. Lung sounds improved after duoneb tx, though patient still has mild diffuse expiratory wheeze, no rales. Patient afebrile on exam. Patient given fluids and pain treated in ED. Patient remains tachycardic from 117-131 during his stay in ED, patient denies drug use. Mild leukocytosis at 11.7, Labs otherwise normal.   Patient with hx of positive D-Dimer in past. Will get Chest CT angio to rule out PE due to patient's consistent tachycardia.   CT shows bronchial thickening consistent with bronchitis in addition to overlying pneumonia. CT suboptimal for PE study, no apparent PE noted. Patient  started on IV antibiotics for CAP. Patient admitted to hospitalist group for observation due to his unstable vital signs. Discussed results and plan with patient/spouse. Both agree with plan. Patient discussed with Dr. Eber Hong.   Meds given in ED:  Medications  ALPRAZolam (XANAX) tablet 0.5 mg (not administered)  nicotine (NICODERM CQ - dosed in mg/24 hours) patch 14 mg (not administered)  enoxaparin (LOVENOX) injection 40 mg (40 mg Subcutaneous Given 05/19/13 1054)  sodium chloride 0.9 % injection 3 mL (3 mLs Intravenous Not Given 05/19/13 0145)  0.9 %  sodium chloride infusion ( Intravenous New Bag/Given 05/19/13 0155)  acetaminophen (TYLENOL) tablet 650 mg (650 mg Oral Given 05/19/13 1610)    Or  acetaminophen (TYLENOL) suppository 650 mg ( Rectal See Alternative 05/19/13 9604)  ondansetron (ZOFRAN) tablet 4 mg (not administered)    Or  ondansetron (ZOFRAN) injection 4 mg (not administered)  levalbuterol (XOPENEX) nebulizer solution 0.63 mg (not administered)  cefTRIAXone (ROCEPHIN) 1 g in dextrose 5 % 50 mL IVPB (not administered)  azithromycin (ZITHROMAX) tablet 500 mg (not administered)  ipratropium (ATROVENT) nebulizer solution 0.5 mg (0.5 mg Nebulization Given 05/18/13 2134)    And  albuterol (PROVENTIL) (5 MG/ML) 0.5% nebulizer solution 5 mg (5 mg Nebulization Given 05/18/13 2134)  sodium chloride 0.9 % bolus 1,000 mL (0 mLs Intravenous Stopped 05/19/13 0043)  cefTRIAXone (ROCEPHIN) 1 g in dextrose 5 % 50 mL IVPB (0 g Intravenous Stopped 05/18/13 2345)  azithromycin (ZITHROMAX) tablet 500 mg (500 mg Oral Given 05/18/13 2241)  iohexol (OMNIPAQUE) 350 MG/ML injection 100 mL (100 mLs Intravenous Contrast Given 05/18/13 2330)  acetaminophen (TYLENOL) tablet 1,000 mg (1,000 mg Oral Given 05/19/13 0040)  LORazepam (ATIVAN) tablet 0.5 mg (0.5 mg Oral Given 05/19/13 0041)    Current Discharge Medication List       Rudene Anda, PA-C 05/19/13 1129

## 2013-05-19 ENCOUNTER — Encounter (HOSPITAL_COMMUNITY): Payer: Self-pay | Admitting: Internal Medicine

## 2013-05-19 DIAGNOSIS — F17200 Nicotine dependence, unspecified, uncomplicated: Secondary | ICD-10-CM

## 2013-05-19 DIAGNOSIS — Z720 Tobacco use: Secondary | ICD-10-CM | POA: Diagnosis present

## 2013-05-19 DIAGNOSIS — I498 Other specified cardiac arrhythmias: Secondary | ICD-10-CM

## 2013-05-19 DIAGNOSIS — R000 Tachycardia, unspecified: Secondary | ICD-10-CM | POA: Diagnosis present

## 2013-05-19 DIAGNOSIS — R0609 Other forms of dyspnea: Secondary | ICD-10-CM

## 2013-05-19 DIAGNOSIS — J189 Pneumonia, unspecified organism: Secondary | ICD-10-CM | POA: Diagnosis present

## 2013-05-19 LAB — CBC
HCT: 40.9 % (ref 39.0–52.0)
Hemoglobin: 13.8 g/dL (ref 13.0–17.0)
MCH: 33.4 pg (ref 26.0–34.0)
MCV: 99 fL (ref 78.0–100.0)
RDW: 13.3 % (ref 11.5–15.5)
WBC: 12.6 10*3/uL — ABNORMAL HIGH (ref 4.0–10.5)

## 2013-05-19 LAB — COMPREHENSIVE METABOLIC PANEL
AST: 19 U/L (ref 0–37)
Albumin: 3.9 g/dL (ref 3.5–5.2)
Alkaline Phosphatase: 84 U/L (ref 39–117)
BUN: 6 mg/dL (ref 6–23)
Calcium: 9.4 mg/dL (ref 8.4–10.5)
GFR calc Af Amer: >90 mL/min (ref >90)
Glucose, Bld: 114 mg/dL — ABNORMAL HIGH (ref 70–99)
Potassium: 3.7 mEq/L (ref 3.5–5.1)
Sodium: 143 mEq/L (ref 135–145)
Total Protein: 6.8 g/dL (ref 6.0–8.3)

## 2013-05-19 LAB — TSH: TSH: 0.497 u[IU]/mL (ref 0.350–4.500)

## 2013-05-19 LAB — STREP PNEUMONIAE URINARY ANTIGEN: Strep Pneumo Urinary Antigen: NEGATIVE

## 2013-05-19 MED ORDER — ONDANSETRON HCL 4 MG PO TABS: 4.0000 mg | ORAL_TABLET | Freq: Four times a day (QID) | ORAL | Status: DC | PRN

## 2013-05-19 MED ORDER — ENOXAPARIN SODIUM 40 MG/0.4ML ~~LOC~~ SOLN
40.0000 mg | SUBCUTANEOUS | Status: DC
  Administered 2013-05-19: 40 mg via SUBCUTANEOUS
  Filled 2013-05-19: qty 0.4

## 2013-05-19 MED ORDER — AZITHROMYCIN 250 MG PO TABS: 500.0000 mg | ORAL_TABLET | ORAL | Status: DC

## 2013-05-19 MED ORDER — SODIUM CHLORIDE 0.9 % IJ SOLN: 3.0000 mL | Freq: Two times a day (BID) | INTRAMUSCULAR | Status: DC

## 2013-05-19 MED ORDER — DEXTROSE 5 % IV SOLN
1.0000 g | INTRAVENOUS | Status: DC
  Filled 2013-05-19: qty 10

## 2013-05-19 MED ORDER — ONDANSETRON HCL 4 MG/2ML IJ SOLN: 4.0000 mg | Freq: Four times a day (QID) | INTRAMUSCULAR | Status: DC | PRN

## 2013-05-19 MED ORDER — ACETAMINOPHEN 650 MG RE SUPP: 650.0000 mg | Freq: Four times a day (QID) | RECTAL | Status: DC | PRN

## 2013-05-19 MED ORDER — SODIUM CHLORIDE 0.9 % IV SOLN
INTRAVENOUS | Status: AC
  Administered 2013-05-19: 02:00:00 via INTRAVENOUS

## 2013-05-19 MED ORDER — ALPRAZOLAM 0.5 MG PO TABS: 0.5000 mg | ORAL_TABLET | Freq: Three times a day (TID) | ORAL | Status: DC | PRN

## 2013-05-19 MED ORDER — LEVALBUTEROL HCL 0.63 MG/3ML IN NEBU: 0.6300 mg | INHALATION_SOLUTION | Freq: Four times a day (QID) | RESPIRATORY_TRACT | Status: DC | PRN

## 2013-05-19 MED ORDER — LORAZEPAM 1 MG PO TABS
0.5000 mg | ORAL_TABLET | Freq: Once | ORAL | Status: AC
  Administered 2013-05-19: 0.5 mg via ORAL
  Filled 2013-05-19: qty 1

## 2013-05-19 MED ORDER — ACETAMINOPHEN 325 MG PO TABS
650.0000 mg | ORAL_TABLET | Freq: Four times a day (QID) | ORAL | Status: DC | PRN
  Administered 2013-05-19: 650 mg via ORAL
  Filled 2013-05-19: qty 2

## 2013-05-19 MED ORDER — LORAZEPAM 1 MG PO TABS: 1.0000 mg | ORAL_TABLET | Freq: Once | ORAL | Status: DC

## 2013-05-19 MED ORDER — ACETAMINOPHEN 500 MG PO TABS
1000.0000 mg | ORAL_TABLET | Freq: Once | ORAL | Status: AC
  Administered 2013-05-19: 1000 mg via ORAL
  Filled 2013-05-19: qty 2

## 2013-05-19 MED ORDER — NICOTINE 14 MG/24HR TD PT24: 14.0000 mg | MEDICATED_PATCH | Freq: Every day | TRANSDERMAL | Status: DC

## 2013-05-19 MED ORDER — LEVOFLOXACIN 750 MG PO TABS: 750.0000 mg | ORAL_TABLET | Freq: Every day | ORAL | Status: DC

## 2013-05-19 NOTE — Progress Notes (Signed)
AVS reviewed with patient.  Prescription provided to patient.  Patient verbalized understanding of physician follow-up visit, discharge instructions and medications.  Patient's IV removed.  Site WNL.  Patient reports all belongings intact and in possession at time of discharge.  Patient transported by NT via w/c to main entrance for discharge.  Patient stable at time of discharge.

## 2013-05-19 NOTE — H&P (Signed)
[facility] History and Physical  [name] [name] QIO:[id number] DOB: [birth year] DOA: 05/18/2013   PCP: Doesn't have a PCP Specialists: None  Chief Complaint: Shortness of breath, cold like symptoms, and chills for the last 3 days  HPI: [name] [name] is a 37 y.o. 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 He had sinus drainage and then for the last 2 days he has noted shortness of breath more so with exertion. He's felt hot and states that he could have had a fever although he did not check his temperature. He had chills. He had cough with yellowish expectoration. Denies any blood in the sputum. And he would have sharp, pleuritic pain whenever he would cough. Denies any sick contacts. No recent travel. Has not received a flu shot this year. No nausea, vomiting, or diarrhea. His symptoms were not improving and so, he decided to come in to the hospital. He was found to be quite tachycardic in the emergency department.  Home Medications: Prior to Admission medications   Medication Sig Start Date End Date Taking? Authorizing Provider  ALPRAZolam Prudy Feeler) 1 MG tablet Take 1 mg by mouth 3 (three) times daily.    Yes Historical Provider, MD  oxyCODONE (OXY IR/ROXICODONE) 5 MG immediate release tablet Take 5 mg by mouth 3 (three) times daily.    Yes Historical Provider, MD    Allergies: No Known Allergies  Past Medical History: Past Medical History  Diagnosis Date  . Hypertension   . Anxiety     Denies any surgeries in the past.  Social History: Lives in [place]. Smokes one pack of cigarettes on a daily basis. No alcohol use. No illicit drug use currently. 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 He is currently unemployed. Independent with daily activities.  Family History: Denies any health problems in his family  Review of Systems - History  obtained from the patient General ROS: positive for  - chills and fatigue Psychological ROS: negative Ophthalmic ROS: negative ENT ROS: positive for - sinus drainage Allergy and Immunology ROS: negative Hematological and Lymphatic ROS: negative Endocrine ROS: negative Respiratory ROS: as in hpi Cardiovascular ROS: as in hpi Gastrointestinal ROS: no abdominal pain, change in bowel habits, or black or bloody stools Genito-Urinary ROS: no dysuria, trouble voiding, or hematuria Musculoskeletal ROS: negative Neurological ROS: no TIA or stroke symptoms Dermatological ROS: negative  Physical Examination  Filed Vitals:   05/18/13 2046 05/18/13 2140 05/18/13 2144 05/18/13 2200  BP: 145/90   131/84  Pulse: 131   127  Temp: 99.7 F (37.6 C)     TempSrc: Oral     Resp: 36     Height: 6' (1.829 m)     Weight: 81.647 kg (180 lb)     SpO2: 93% 94% 94% 94%    General appearance: alert, cooperative, appears stated age and no distress Head: Normocephalic, without obvious abnormality, atraumatic Eyes: conjunctivae/corneas clear. PERRL, EOM's intact Throat: Dry Mucus membranes Neck: no adenopathy, no carotid bruit, no JVD, supple, symmetrical, trachea midline and thyroid not enlarged, symmetric, no tenderness/mass/nodules Back: symmetric, no curvature. ROM normal. No CVA tenderness. Resp: Coarse breath sounds bilaterally, with diminished air entry at the bases. No wheezing. No definite crackles. Cardio: S1-S2 is tachycardic. Regular. No S3, S4. No rubs, murmurs, or bruits. No pedal edema. No JVD GI: soft, non-tender; bowel sounds  normal; no masses,  no organomegaly Extremities: extremities normal, atraumatic, no cyanosis or edema Pulses: 2+ and symmetric Skin: Skin color, texture, turgor normal. No rashes or lesions Neurologic: Alert and oriented x 3. No focal deficits.  Laboratory Data: Results for orders placed during the hospital encounter of 05/18/13 (from the past 48 hour(s))  CBC  WITH DIFFERENTIAL     Status: Abnormal   Collection Time    05/18/13  9:29 PM      Result Value Range   WBC 11.7 (*) 4.0 - 10.5 K/uL   RBC 4.57  4.22 - 5.81 MIL/uL   Hemoglobin 15.3  13.0 - 17.0 g/dL   HCT 16.1  09.6 - 04.5 %   MCV 99.1  78.0 - 100.0 fL   MCH 33.5  26.0 - 34.0 pg   MCHC 33.8  30.0 - 36.0 g/dL   RDW 40.9  81.1 - 91.4 %   Platelets 193  150 - 400 K/uL   Neutrophils Relative % 90 (*) 43 - 77 %   Neutro Abs 10.5 (*) 1.7 - 7.7 K/uL   Lymphocytes Relative 6 (*) 12 - 46 %   Lymphs Abs 0.7  0.7 - 4.0 K/uL   Monocytes Relative 4  3 - 12 %   Monocytes Absolute 0.5  0.1 - 1.0 K/uL   Eosinophils Relative 0  0 - 5 %   Eosinophils Absolute 0.0  0.0 - 0.7 K/uL   Basophils Relative 0  0 - 1 %   Basophils Absolute 0.0  0.0 - 0.1 K/uL  BASIC METABOLIC PANEL     Status: Abnormal   Collection Time    05/18/13  9:29 PM      Result Value Range   Sodium 141  135 - 145 mEq/L   Potassium 3.7  3.5 - 5.1 mEq/L   Chloride 102  96 - 112 mEq/L   CO2 27  19 - 32 mEq/L   Glucose, Bld 131 (*) 70 - 99 mg/dL   BUN 6  6 - 23 mg/dL   Creatinine, Ser 7.82  0.50 - 1.35 mg/dL   Calcium 9.8  8.4 - 95.6 mg/dL   GFR calc non Af Amer >90  >90 mL/min   GFR calc Af Amer >90  >90 mL/min   Comment: (NOTE)     The eGFR has been calculated using the CKD EPI equation.     This calculation has not been validated in all clinical situations.     eGFR's persistently <90 mL/min signify possible Chronic Kidney     Disease.    Radiology Reports: Dg Chest 2 View  05/18/2013   CLINICAL DATA:  Cough and congestion.  EXAM: CHEST  2 VIEW  COMPARISON:  04/16/2011  FINDINGS: The heart size and mediastinal contours are within normal limits. Both lungs are clear. Mild degenerative endplate changes in the thoracic spine.  IMPRESSION: No active cardiopulmonary disease.   Electronically Signed   By: Richarda Overlie M.D.   On: 05/18/2013 21:39   Ct Angio Chest Pe W/cm &/or Wo Cm  05/18/2013   CLINICAL DATA:  Upper respiratory  illness for 2 days, shortness of breath and chills and fever. History of deep vein thrombosis and tachycardia.  EXAM: CT ANGIOGRAPHY CHEST WITH CONTRAST  TECHNIQUE: Multidetector CT imaging of the chest was performed using the standard protocol during bolus administration of intravenous contrast. Multiplanar CT image reconstructions including MIPs were obtained to evaluate the vascular anatomy.  CONTRAST:  OMNIPAQUE IOHEXOL 350  MG/ML SOLN  COMPARISON:  Chest radiograph May 18, 2013 and CT of the chest April 16, 2011.  FINDINGS: Suboptimal opacification of the pulmonary arteries may limit evaluation. However, the main pulmonary artery is not enlarged, no definite pulmonary arterial filling defects the level at least the subsegmental branches.  Minimal ground-glass opacities in the left upper lobe, associated with mild bronchial wall thickening. Patchy ground-glass opacities in the right middle lobe, and left lung base. No pleural effusions. No pulmonary masses. Mild unintended patient motion degrades sensitivity for pulmonary nodules. Tracheobronchial tree is patent, no pneumothorax.  Heart and pericardium are unremarkable. Thoracic aorta is normal course and caliber, unremarkable. No lymphadenopathy by CT size criteria. Thoracic esophagus is unremarkable. Included view of the liver is not suspicious ; mild fatty infiltration about the falciform ligament. 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 Scattered Schmorl's nodes.  Review of the MIP images confirms the above findings.  IMPRESSION: Suboptimal bolus timing with no convincing evidence of pulmonary embolism.  Mild bronchial wall thickening with patchy airspace opacities concerning for bronchitis and superimposed pneumonia.   Electronically Signed   By: Awilda Metro   On: 05/18/2013 23:42    Electrocardiogram: Pending  Problem List  Principal Problem:   CAP (community acquired pneumonia) Active Problems:   Sinus  tachycardia   Assessment: This is a 37 year old, Caucasian male who presents with three-day history of chills, shortness of breath. Chest x-ray did not show anything concerning, but CT scan raised the possibility of pneumonia. This will considered community acquired pneumonia. Influenza is another possibility  Plan: #1 community-acquired pneumonia: He'll be treated with ceftriaxone and azithromycin. Nebulizer treatments as needed. He's not really wheezing at this time.  #2 sinus tachycardia: Most likely due to fever and shortness of breath. We will get an EKG. We'll check TSH level. Give IV fluids. Heart rate should improve as his respiratory status improves.  #3 tobacco abuse: Counseling was provided. Nicotine patch will be prescribed.   DVT Prophylaxis: Lovenox Code Status: Full code Family Communication: Discussed with the patient  Disposition Plan: Observe on telemetry   Further management decisions will depend on results of further testing and patient's response to treatment.  [facility]  [facility] Pager [phone]  If 7PM-7AM, please contact night-coverage [email or web address] Password [email or web address]  05/19/2013, 1:08 AM

## 2013-05-19 NOTE — Discharge Summary (Signed)
Physician Discharge Summary  [name] NWG:[id number] DOB: 07/02/1975 DOA: 05/18/2013  PCP: No primary provider on file.  Admit date: 05/18/2013 Discharge date: 05/19/2013  Time spent: 45 minutes  Recommendations for Outpatient Follow-up:  -Will be discharged home today. -Advised to follow up with his PCP in 2 weeks.   Discharge Diagnoses:  Principal Problem:   CAP (community acquired pneumonia) Active Problems:   Sinus tachycardia   Tobacco abuse   Discharge Condition: Stable and improved  Filed Weights   05/18/13 2046 05/19/13 0139 05/19/13 0147  Weight: 81.647 kg (180 lb) 81.647 kg (180 lb) 81.9 kg (180 lb 8.9 oz)    History of present illness:  [name] [name] is a 37 y.o. 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 He had sinus drainage and then for the last 2 days he has noted shortness of breath more so with exertion. He's felt hot and states that he could have had a fever although he did not check his temperature. He had chills. He had cough with yellowish expectoration. Denies any blood in the sputum. And he would have sharp, pleuritic pain whenever he would cough. Denies any sick contacts. No recent travel. Has not received a flu shot this year. No nausea, vomiting, or diarrhea. His symptoms were not improving and so, he decided to come in to the hospital. He was found to be quite tachycardic in the emergency department. We were asked to admit him for further evaluation and management.   Hospital Course:   CAP -Not hypoxic. -Tolerating POs. -No nausea. -Has been afebrile. -Will DC home on 10 days of Levaquin.  Sinus Tachycardia -Resolved. -Suspect related to ongoing PNA and decreased PO intake leading to volume contraction.  Tobacco/Drug Abuse -Counseled on cessation.  Procedures:  None    Consultations:  None  Discharge Instructions  Discharge Orders   Future Orders Complete By Expires   Discontinue IV  As directed    Increase activity slowly  As directed        Medication List    STOP taking these medications       oxyCODONE 5 MG immediate release tablet  Commonly known as:  Oxy IR/ROXICODONE      TAKE these medications       ALPRAZolam 1 MG tablet  Commonly known as:  XANAX  Take 1 mg by mouth 3 (three) times daily.     levofloxacin 750 MG tablet  Commonly known as:  LEVAQUIN  Take 1 tablet (750 mg total) by mouth daily. For 10 days       No Known Allergies     Follow-up Information   Schedule an appointment as soon as possible for a visit in 2 weeks to follow up. (With your regular doctor)        The results of significant diagnostics from this hospitalization (including imaging, microbiology, ancillary and laboratory) are listed below for reference.    Significant Diagnostic Studies: Dg Chest 2 View  05/18/2013   CLINICAL DATA:  Cough and congestion.  EXAM: CHEST  2 VIEW  COMPARISON:  04/16/2011  FINDINGS: The heart size and mediastinal contours are within normal limits. Both lungs are clear. Mild degenerative endplate changes in the thoracic spine.  IMPRESSION: No active cardiopulmonary disease.   Electronically Signed   By: Richarda Overlie M.D.   On:  05/18/2013 21:39   Ct Angio Chest Pe W/cm &/or Wo Cm  05/18/2013   CLINICAL DATA:  Upper respiratory illness for 2 days, shortness of breath and chills and fever. History of deep vein thrombosis and tachycardia.  EXAM: CT ANGIOGRAPHY CHEST WITH CONTRAST  TECHNIQUE: Multidetector CT imaging of the chest was performed using the standard protocol during bolus administration of intravenous contrast. Multiplanar CT image reconstructions including MIPs were obtained to evaluate the vascular anatomy.  CONTRAST:  OMNIPAQUE IOHEXOL 350 MG/ML SOLN  COMPARISON:  Chest radiograph May 18, 2013 and CT of  the chest April 16, 2011.  FINDINGS: Suboptimal opacification of the pulmonary arteries may limit evaluation. However, the main pulmonary artery is not enlarged, no definite pulmonary arterial filling defects the level at least the subsegmental branches.  Minimal ground-glass opacities in the left upper lobe, associated with mild bronchial wall thickening. Patchy ground-glass opacities in the right middle lobe, and left lung base. No pleural effusions. No pulmonary masses. Mild unintended patient motion degrades sensitivity for pulmonary nodules. Tracheobronchial tree is patent, no pneumothorax.  Heart and pericardium are unremarkable. Thoracic aorta is normal course and caliber, unremarkable. No lymphadenopathy by CT size criteria. Thoracic esophagus is unremarkable. Included view of the liver is not suspicious ; mild fatty infiltration about the falciform ligament. 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 Scattered Schmorl's nodes.  Review of the MIP images confirms the above findings.  IMPRESSION: Suboptimal bolus timing with no convincing evidence of pulmonary embolism.  Mild bronchial wall thickening with patchy airspace opacities concerning for bronchitis and superimposed pneumonia.   Electronically Signed   By: Awilda Metro   On: 05/18/2013 23:42    Microbiology: No results found for this or any previous visit (from the past 240 hour(s)).   Labs: Basic Metabolic Panel:  Recent Labs Lab 05/18/13 2129 05/19/13 0536  NA 141 143  K 3.7 3.7  CL 102 106  CO2 27 24  GLUCOSE 131* 114*  BUN 6 6  CREATININE 0.87 0.83  CALCIUM 9.8 9.4   Liver Function Tests:  Recent Labs Lab 05/19/13 0536  AST 19  ALT 16  ALKPHOS 84  BILITOT 1.5*  PROT 6.8  ALBUMIN 3.9   No results found for this basename: LIPASE, AMYLASE,  in the last 168 hours No results found for this basename: AMMONIA,  in the last 168 hours CBC:  Recent Labs Lab 05/18/13 2129 05/19/13 0536  WBC 11.7*  12.6*  NEUTROABS 10.5*  --   HGB 15.3 13.8  HCT 45.3 40.9  MCV 99.1 99.0  PLT 193 174   Cardiac Enzymes: No results found for this basename: CKTOTAL, CKMB, CKMBINDEX, TROPONINI,  in the last 168 hours BNP: BNP (last 3 results) No results found for this basename: PROBNP,  in the last 8760 hours CBG: No results found for this basename: GLUCAP,  in the last 168 hours     Signed:  Chaya Jan  [facility] Pager: [phone] 05/19/2013, 3:39 PM

## 2013-05-19 NOTE — Progress Notes (Signed)
Utilization Review completed.  

## 2013-05-20 LAB — LEGIONELLA ANTIGEN, URINE: Legionella Antigen, Urine: NEGATIVE

## 2013-05-21 NOTE — ED Provider Notes (Signed)
37 year old male who abuses tobacco, history of heroin abuse as well, presents with complaint of shortness of breath and coughing. He states that several days ago he developed sinus congestion and drainage, yesterday to history of nasal congestion and drainage and chest congestion. Today he states that he has been having shortness of breath, significant increased amount of coughing, on exam he has diffuse mild expiratory wheezing, speaks in full sentences but has a mild tachypnea, no focal rales with inspiration, no peripheral edema and a soft abdomen. He is slightly tachycardic to 110 beats per minute, oropharynx is clear and moist but erythematous, nasal passages with swollen turbinates and clear rhinorrhea. He has no focal sinus tenderness, very supple neck with no lymphadenopathy, no fever on vital signs. He will need albuterol nebulizer, chest x-ray, recheck vital signs, Zithromax.   The patient remained tachycardic, required admission to the hospital.  Medical screening examination/treatment/procedure(s) were conducted as a shared visit with non-physician practitioner(s) and myself. I personally evaluated the patient during the encounter.     Vida Roller, MD 05/21/13 0100

## 2014-01-16 ENCOUNTER — Emergency Department (HOSPITAL_COMMUNITY): Payer: Self-pay

## 2014-01-16 ENCOUNTER — Encounter (HOSPITAL_COMMUNITY): Payer: Self-pay | Admitting: Emergency Medicine

## 2014-01-16 ENCOUNTER — Emergency Department (HOSPITAL_COMMUNITY)
Admission: EM | Admit: 2014-01-16 | Discharge: 2014-01-16 | Disposition: A | Discharge status: Home / Self Care | Payer: Self-pay | Attending: Emergency Medicine | Admitting: Emergency Medicine

## 2014-01-16 DIAGNOSIS — M545 Low back pain, unspecified: Secondary | ICD-10-CM | POA: Insufficient documentation

## 2014-01-16 DIAGNOSIS — M549 Dorsalgia, unspecified: Secondary | ICD-10-CM | POA: Insufficient documentation

## 2014-01-16 DIAGNOSIS — R509 Fever, unspecified: Secondary | ICD-10-CM | POA: Insufficient documentation

## 2014-01-16 DIAGNOSIS — F17200 Nicotine dependence, unspecified, uncomplicated: Secondary | ICD-10-CM | POA: Insufficient documentation

## 2014-01-16 DIAGNOSIS — F411 Generalized anxiety disorder: Secondary | ICD-10-CM | POA: Insufficient documentation

## 2014-01-16 DIAGNOSIS — R339 Retention of urine, unspecified: Secondary | ICD-10-CM | POA: Insufficient documentation

## 2014-01-16 DIAGNOSIS — I10 Essential (primary) hypertension: Secondary | ICD-10-CM | POA: Insufficient documentation

## 2014-01-16 DIAGNOSIS — Z79899 Other long term (current) drug therapy: Secondary | ICD-10-CM | POA: Insufficient documentation

## 2014-01-16 LAB — CBC WITH DIFFERENTIAL/PLATELET
BASOS PCT: 0 % (ref 0–1)
Basophils Absolute: 0 10*3/uL (ref 0.0–0.1)
EOS ABS: 0 10*3/uL (ref 0.0–0.7)
Eosinophils Relative: 0 % (ref 0–5)
HCT: 43.2 % (ref 39.0–52.0)
Hemoglobin: 14.7 g/dL (ref 13.0–17.0)
Lymphocytes Relative: 9 % — ABNORMAL LOW (ref 12–46)
Lymphs Abs: 0.8 10*3/uL (ref 0.7–4.0)
MCH: 33.4 pg (ref 26.0–34.0)
MCHC: 34 g/dL (ref 30.0–36.0)
MCV: 98.2 fL (ref 78.0–100.0)
Monocytes Absolute: 1.2 10*3/uL — ABNORMAL HIGH (ref 0.1–1.0)
Monocytes Relative: 12 % (ref 3–12)
NEUTROS PCT: 79 % — AB (ref 43–77)
Neutro Abs: 7.6 10*3/uL (ref 1.7–7.7)
Platelets: 199 10*3/uL (ref 150–400)
RBC: 4.4 MIL/uL (ref 4.22–5.81)
RDW: 13.1 % (ref 11.5–15.5)
WBC: 9.6 10*3/uL (ref 4.0–10.5)

## 2014-01-16 LAB — URINALYSIS, ROUTINE W REFLEX MICROSCOPIC
Bilirubin Urine: NEGATIVE
Glucose, UA: NEGATIVE mg/dL
Hgb urine dipstick: NEGATIVE
KETONES UR: 15 mg/dL — AB
Leukocytes, UA: NEGATIVE
NITRITE: NEGATIVE
PROTEIN: NEGATIVE mg/dL
Specific Gravity, Urine: 1.01 (ref 1.005–1.030)
Urobilinogen, UA: 0.2 mg/dL (ref 0.0–1.0)
pH: 6.5 (ref 5.0–8.0)

## 2014-01-16 LAB — BASIC METABOLIC PANEL
Anion gap: 11 (ref 5–15)
BUN: 6 mg/dL (ref 6–23)
CO2: 27 mEq/L (ref 19–32)
Calcium: 9.4 mg/dL (ref 8.4–10.5)
Chloride: 99 mEq/L (ref 96–112)
Creatinine, Ser: 0.84 mg/dL (ref 0.50–1.35)
Glucose, Bld: 181 mg/dL — ABNORMAL HIGH (ref 70–99)
Potassium: 4.2 mEq/L (ref 3.7–5.3)
SODIUM: 137 meq/L (ref 137–147)

## 2014-01-16 LAB — LACTIC ACID, PLASMA: LACTIC ACID, VENOUS: 0.9 mmol/L (ref 0.5–2.2)

## 2014-01-16 MED ORDER — MORPHINE SULFATE 4 MG/ML IJ SOLN
4.0000 mg | Freq: Once | INTRAMUSCULAR | Status: AC
  Administered 2014-01-16: 4 mg via INTRAVENOUS
  Filled 2014-01-16: qty 1

## 2014-01-16 MED ORDER — DIAZEPAM 5 MG PO TABS: 5.0000 mg | ORAL_TABLET | Freq: Three times a day (TID) | ORAL | Status: DC | PRN

## 2014-01-16 MED ORDER — KETOROLAC TROMETHAMINE 30 MG/ML IJ SOLN
30.0000 mg | Freq: Once | INTRAMUSCULAR | Status: AC
  Administered 2014-01-16: 30 mg via INTRAVENOUS
  Filled 2014-01-16: qty 1

## 2014-01-16 MED ORDER — HYDROMORPHONE HCL PF 1 MG/ML IJ SOLN
1.0000 mg | Freq: Once | INTRAMUSCULAR | Status: AC
  Administered 2014-01-16: 1 mg via INTRAVENOUS
  Filled 2014-01-16: qty 1

## 2014-01-16 MED ORDER — OXYCODONE-ACETAMINOPHEN 5-325 MG PO TABS: 1.0000 | ORAL_TABLET | Freq: Four times a day (QID) | ORAL | Status: DC | PRN

## 2014-01-16 MED ORDER — ACETAMINOPHEN 500 MG PO TABS
1000.0000 mg | ORAL_TABLET | Freq: Once | ORAL | Status: AC
  Administered 2014-01-16: 1000 mg via ORAL
  Filled 2014-01-16: qty 2

## 2014-01-16 MED ORDER — IBUPROFEN 800 MG PO TABS: 800.0000 mg | ORAL_TABLET | Freq: Three times a day (TID) | ORAL | Status: DC

## 2014-01-16 MED ORDER — DOXYCYCLINE HYCLATE 50 MG PO CAPS: 50.0000 mg | ORAL_CAPSULE | Freq: Two times a day (BID) | ORAL | Status: DC

## 2014-01-16 MED ORDER — SODIUM CHLORIDE 0.9 % IJ SOLN
INTRAMUSCULAR | Status: AC
  Filled 2014-01-16: qty 60

## 2014-01-16 MED ORDER — GADOBENATE DIMEGLUMINE 529 MG/ML IV SOLN
15.0000 mL | Freq: Once | INTRAVENOUS | Status: AC | PRN
  Administered 2014-01-16: 15 mL via INTRAVENOUS

## 2014-01-16 MED ORDER — SODIUM CHLORIDE 0.9 % IV BOLUS (SEPSIS)
1000.0000 mL | Freq: Once | INTRAVENOUS | Status: AC
  Administered 2014-01-16: 1000 mL via INTRAVENOUS

## 2014-01-16 MED ORDER — ONDANSETRON HCL 4 MG/2ML IJ SOLN
4.0000 mg | Freq: Once | INTRAMUSCULAR | Status: AC
  Administered 2014-01-16: 4 mg via INTRAVENOUS
  Filled 2014-01-16: qty 2

## 2014-01-16 NOTE — ED Notes (Signed)
Pt aware we are waiting on blood cultures to be drawn prior to d/c

## 2014-01-16 NOTE — ED Notes (Signed)
Pt remains out of room in mri

## 2014-01-16 NOTE — Discharge Instructions (Signed)
Lumbosacral Strain Lumbosacral strain is a strain of any of the parts that make up your lumbosacral vertebrae. Your lumbosacral vertebrae are the bones that make up the lower third of your backbone. Your lumbosacral vertebrae are held together by muscles and tough, fibrous tissue (ligaments).  CAUSES  A sudden blow to your back can cause lumbosacral strain. Also, anything that causes an excessive stretch of the muscles in the low back can cause this strain. This is typically seen when people exert themselves strenuously, fall, lift heavy objects, bend, or crouch repeatedly. RISK FACTORS  Physically demanding work.  Participation in pushing or pulling sports or sports that require a sudden twist of the back (tennis, golf, baseball).  Weight lifting.  Excessive lower back curvature.  Forward-tilted pelvis.  Weak back or abdominal muscles or both.  Tight hamstrings. SIGNS AND SYMPTOMS  Lumbosacral strain may cause pain in the area of your injury or pain that moves (radiates) down your leg.  DIAGNOSIS Your health care provider can often diagnose lumbosacral strain through a physical exam. In some cases, you may need tests such as X-ray exams.  TREATMENT  Treatment for your lower back injury depends on many factors that your clinician will have to evaluate. However, most treatment will include the use of anti-inflammatory medicines. HOME CARE INSTRUCTIONS   Avoid hard physical activities (tennis, racquetball, waterskiing) if you are not in proper physical condition for it. This may aggravate or create problems.  If you have a back problem, avoid sports requiring sudden body movements. Swimming and walking are generally safer activities.  Maintain good posture.  Maintain a healthy weight.  For acute conditions, you may put ice on the injured area.  Put ice in a plastic bag.  Place a towel between your skin and the bag.  Leave the ice on for 20 minutes, 2-3 times a day.  When the  low back starts healing, stretching and strengthening exercises may be recommended. SEEK MEDICAL CARE IF:  Your back pain is getting worse.  You experience severe back pain not relieved with medicines. SEEK IMMEDIATE MEDICAL CARE IF:   You have numbness, tingling, weakness, or problems with the use of your arms or legs.  There is a change in bowel or bladder control.  You have increasing pain in any area of the body, including your belly (abdomen).  You notice shortness of breath, dizziness, or feel faint.  You feel sick to your stomach (nauseous), are throwing up (vomiting), or become sweaty.  You notice discoloration of your toes or legs, or your feet get very cold. MAKE SURE YOU:   Understand these instructions.  Will watch your condition.  Will get help right away if you are not doing well or get worse. Document Released: 03/09/2005 Document Revised: 06/04/2013 Document Reviewed: 01/16/2013 [facility] Patient Information 2015 [place], [place]. This information is not intended to replace advice given to you by your health care provider. Make sure you discuss any questions you have with your health care provider.    Acute Urinary Retention Acute urinary retention is the temporary inability to urinate. This is a common problem in older men. As men age their prostates become larger and block the flow of urine from the bladder. This is usually a problem that has come on gradually.  HOME CARE INSTRUCTIONS If you are sent home with a Foley catheter and a drainage system, you will need to discuss the best course of action with your health care provider. While the catheter is in,  maintain a good intake of fluids. Keep the drainage bag emptied and lower than your catheter. This is so that contaminated urine will not flow back into your bladder, which could lead to a urinary tract infection. There are two main types of drainage bags. One is a large bag that usually is used at night. It has  a good capacity that will allow you to sleep through the night without having to empty it. The second type is called a leg bag. It has a smaller capacity, so it needs to be emptied more frequently. However, the main advantage is that it can be attached by a leg strap and can go underneath your clothing, allowing you the freedom to move about or leave your home. Only take over-the-counter or prescription medicines for pain, discomfort, or fever as directed by your health care provider.  SEEK MEDICAL CARE IF:  You develop a low-grade fever.  You experience spasms or leakage of urine with the spasms. SEEK IMMEDIATE MEDICAL CARE IF:   You develop chills or fever.  Your catheter stops draining urine.  Your catheter falls out.  You start to develop increased bleeding that does not respond to rest and increased fluid intake. MAKE SURE YOU:  Understand these instructions.  Will watch your condition.  Will get help right away if you are not doing well or get worse. Document Released: 09/05/2000 Document Revised: 06/04/2013 Document Reviewed: 11/08/2012 [facility] Patient Information 2015 [place], [place]. This information is not intended to replace advice given to you by your health care provider. Make sure you discuss any questions you have with your health care provider.   Tick Bite Information Ticks are insects that attach themselves to the skin and draw blood for food. There are various types of ticks. Common types include wood ticks and deer ticks. Most ticks live in shrubs and grassy areas. Ticks can climb onto your body when you make contact with leaves or grass where the tick is waiting. The most common places on the body for ticks to attach themselves are the scalp, neck, armpits, waist, and groin. Most tick bites are harmless, but sometimes ticks carry germs that cause diseases. These germs can be spread to a person during the tick's feeding process. The chance of a disease spreading  through a tick bite depends on:   The type of tick.  Time of year.   How long the tick is attached.   Geographic location.  HOW CAN YOU PREVENT TICK BITES? Take these steps to help prevent tick bites when you are outdoors:  Wear protective clothing. Long sleeves and long pants are best.   Wear white clothes so you can see ticks more easily.  Tuck your pant legs into your socks.   If walking on a trail, stay in the middle of the trail to avoid brushing against bushes.  Avoid walking through areas with long grass.  Put insect repellent on all exposed skin and along boot tops, pant legs, and sleeve cuffs.   Check clothing, hair, and skin repeatedly and before going inside.   Brush off any ticks that are not attached.  Take a shower or bath as soon as possible after being outdoors.  WHAT IS THE PROPER WAY TO REMOVE A TICK? Ticks should be removed as soon as possible to help prevent diseases caused by tick bites. 1. If latex gloves are available, put them on before trying to remove a tick.  2. Using fine-point tweezers, grasp the tick as close  to the skin as possible. You may also use curved forceps or a tick removal tool. Grasp the tick as close to its head as possible. Avoid grasping the tick on its body. 3. Pull gently with steady upward pressure until the tick lets go. Do not twist the tick or jerk it suddenly. This may break off the tick's head or mouth parts. 4. Do not squeeze or crush the tick's body. This could force disease-carrying fluids from the tick into your body.  5. After the tick is removed, wash the bite area and your hands with soap and water or other disinfectant such as alcohol. 6. Apply a small amount of antiseptic cream or ointment to the bite site.  7. Wash and disinfect any instruments that were used.  Do not try to remove a tick by applying a hot match, petroleum jelly, or fingernail polish to the tick. These methods do not work and may  increase the chances of disease being spread from the tick bite.  WHEN SHOULD YOU SEEK MEDICAL CARE? Contact your health care provider if you are unable to remove a tick from your skin or if a part of the tick breaks off and is stuck in the skin.  After a tick bite, you need to be aware of signs and symptoms that could be related to diseases spread by ticks. Contact your health care provider if you develop any of the following in the days or weeks after the tick bite:  Unexplained fever.  Rash. A circular rash that appears days or weeks after the tick bite may indicate the possibility of Lyme disease. The rash may resemble a target with a bull's-eye and may occur at a different part of your body than the tick bite.  Redness and swelling in the area of the tick bite.   Tender, swollen lymph glands.   Diarrhea.   Weight loss.   Cough.   Fatigue.   Muscle, joint, or bone pain.   Abdominal pain.   Headache.   Lethargy or a change in your level of consciousness.  Difficulty walking or moving your legs.   Numbness in the legs.   Paralysis.  Shortness of breath.   Confusion.   Repeated vomiting.  Document Released: 05/27/2000 Document Revised: 03/20/2013 Document Reviewed: 11/07/2012 [facility] Patient Information 2015 [place]. This information is not intended to replace advice given to you by your health care provider. Make sure you discuss any questions you have with your health care provider.

## 2014-01-16 NOTE — ED Notes (Signed)
Pt states lower back pain along with bilateral flank pain. Pt states he has been running a fever. Pt is concerned of kidney stones but states he has no history of such. Pt states pain is shooting down right leg, into right knee. NAD>

## 2014-01-16 NOTE — ED Provider Notes (Addendum)
TIME SEEN: 1:40 PM  CHIEF COMPLAINT: Lower back pain, fever, difficulty urinating  HPI: Patient is a 38 y.o. M with history of hypertension and anxiety who presents to the emergency department with 3 days of lower back pain, fever of 100.6, difficulty urinating. He denies any history of injury to his back. Denies any numbness, tingling or focal weakness. He states he does have "aching" in his right knee. No bowel or bladder incontinence. He denies IV drug use but hesitates when I asked this question; review of his records from 2014 show a history of heroin abuse. Denies a history of back surgeries or epidural injections. He states that he feels he may have a kidney stone but has no flank pain on exam and denies a history of prior renal stone.  Denies headache, neck pain, cough, abdominal pain, vomiting or diarrhea, rash. No tick bite. No sick contacts or recent travel. Denies a history of sexually transmitted diseases. He is sexually active with one partner. No testicular pain or swelling, scrotal masses, penile discharge.  denies dysuria, hematuria, urinary urgency or frequency. States he has had a prior urinary tract infection but states this feels different.  ROS: See HPI Constitutional: no fever  Eyes: no drainage  ENT: no runny nose   Cardiovascular:  no chest pain  Resp: no SOB  GI: no vomiting GU: no dysuria Integumentary: no rash  Allergy: no hives  Musculoskeletal: no leg swelling  Neurological: no slurred speech ROS otherwise negative  PAST MEDICAL HISTORY/PAST SURGICAL HISTORY:  Past Medical History  Diagnosis Date  . Hypertension   . Anxiety     MEDICATIONS:  Prior to Admission medications   Medication Sig Start Date End Date Taking? Authorizing Provider  ALPRAZolam Prudy Feeler) 1 MG tablet Take 1 mg by mouth 3 (three) times daily.    Yes Historical Provider, MD  Oxycodone-Acetaminophen (PERCOCET PO) Take 1 tablet by mouth as needed (pain).   Yes Historical Provider, MD     ALLERGIES:  No Known Allergies  SOCIAL HISTORY:  History  Substance Use Topics  . Smoking status: Current Every Day Smoker -- 0.50 packs/day  . Smokeless tobacco: Not on file  . Alcohol Use: Yes     Comment: occ    FAMILY HISTORY: No family history on file.  EXAM: BP 137/84  Pulse 106  Temp(Src) 99.2 F (37.3 C)  Resp 18  Ht 6' (1.829 m)  Wt 180 lb (81.647 kg)  BMI 24.41 kg/m2  SpO2 98% CONSTITUTIONAL: Alert and oriented and responds appropriately to questions. Well-appearing; well-nourished HEAD: Normocephalic EYES: Conjunctivae clear, PERRL ENT: normal nose; no rhinorrhea; moist mucous membranes; pharynx without lesions noted NECK: Supple, no meningismus, no LAD  CARD: RRR; S1 and S2 appreciated; no murmurs, no clicks, no rubs, no gallops RESP: Normal chest excursion without splinting or tachypnea; breath sounds clear and equal bilaterally; no wheezes, no rhonchi, no rales,  ABD/GI: Normal bowel sounds; non-distended; soft, non-tender, no rebound, no guarding GU: No penile discharge, no testicular pain or swelling, no scrotal masses, no perineal erythema or warmth or induration or crepitus RECTAL:  Normal rectal tone, no gross blood or melena, soft stool in the rectal vault, nontender and nondistended prostate BACK:  The back appears normal and is tender to palpation in the lumbar spine with midline spinal tenderness, no CVA tenderness, no lesions EXT: Normal ROM in all joints; non-tender to palpation; no edema; normal capillary refill; no cyanosis    SKIN: Normal color for age and race;  warm, no rash or lesions; no areas of cellulitis or abscess NEURO: Moves all extremities equally, sensation to light touch intact diffusely, normal gait, 2+ bilateral patellar and Achilles deep tendon reflexes, no clonus, normal Babinski, negative straight leg raise PSYCH: The patient's mood and manner are appropriate. Grooming and personal hygiene are appropriate.  MEDICAL DECISION  MAKING: Patient here with urinary retention, fever and lower back pain. Concern for possible cauda equina, epidural abscess, discitis, UTI. Will obtain labs, urine, postvoid residual and give pain and nausea medicine. I feel he would need an MRI of his lumbar spine.  ED PROGRESS: Patient's labs are unremarkable. No leukocytosis. Patient was able to urinate a small amount but has a postvoid residual greater than 300 mL's. [address] Foley catheter. Urine MRI pending.   3:45 PM  Labs are unremarkable. Urine shows no hematuria or sign of infection. MRI pending.   4:47 PM  Pt's MRI shows a small L4-L5 disc protrusion which contacts the left L5 nerve root without evidence of significant mass effect. There is no sign of cauda equina or epidural abscess or discitis.  Discussed again with Dr. Mosetta PuttGrady with radiology.  On reevaluation, patient is still having back pain but denies any current radiation of his pain is still neurologically intact. He has a Foley catheter in place. He denies again any headache, neck pain or neck stiffness, cough, abdominal pain or dysuria, chest pain or shortness of breath. He has chronic diarrhea that is unchanged. He states he is sexually active with one partner for the past 8 years and has never had a STD. He is not concerned that he has been exposed to any STDs. He states he did walk through the woods with his son last week but has no known tick bite. His GU and rectal exam are normal. He has a history of prior drug abuse but denies this currently. No chest pain or shortness of breath. Unlikely endocarditis. Blood cultures are pending. We'll discharge home with prescription for doxycycline given fever with possible tick exposure. Will also discharge him with pain medication for his back pain. Suspect his back pain may be secondary to muscle strain, spasm. Less likely radiculopathy. We'll discharge him with Foley catheter in place and give urology followup for urinary retention. Have  discussed strict return precautions. Patient verbalizes understanding and is comfortable with plan.  Layla MawKristen N Lavi Sheehan, DO 01/16/14 1535  Tuesday Terlecki N Fuad Forget, DO 01/16/14 1650  Layla MawKristen N Danielly Ackerley, DO 01/16/14 1655  Keerthana Vanrossum N Troyce Gieske, DO 01/16/14 1701

## 2014-01-16 NOTE — ED Notes (Signed)
Blood cutlures drawn by tech prior to d/c

## 2014-01-17 ENCOUNTER — Telehealth (HOSPITAL_BASED_OUTPATIENT_CLINIC_OR_DEPARTMENT_OTHER): Payer: Self-pay | Admitting: Emergency Medicine

## 2014-01-17 ENCOUNTER — Telehealth (HOSPITAL_COMMUNITY): Payer: Self-pay

## 2014-01-18 ENCOUNTER — Telehealth (HOSPITAL_BASED_OUTPATIENT_CLINIC_OR_DEPARTMENT_OTHER): Payer: Self-pay | Admitting: Emergency Medicine

## 2014-01-18 ENCOUNTER — Encounter (HOSPITAL_COMMUNITY): Payer: Self-pay | Admitting: Emergency Medicine

## 2014-01-18 ENCOUNTER — Inpatient Hospital Stay (HOSPITAL_COMMUNITY)
Admission: EM | Admit: 2014-01-18 | Discharge: 2014-01-21 | DRG: 872 | Disposition: A | Discharge status: Home Health | Payer: Self-pay | Attending: Internal Medicine | Admitting: Internal Medicine

## 2014-01-18 DIAGNOSIS — N320 Bladder-neck obstruction: Secondary | ICD-10-CM | POA: Diagnosis present

## 2014-01-18 DIAGNOSIS — A4901 Methicillin susceptible Staphylococcus aureus infection, unspecified site: Secondary | ICD-10-CM | POA: Diagnosis present

## 2014-01-18 DIAGNOSIS — R509 Fever, unspecified: Secondary | ICD-10-CM

## 2014-01-18 DIAGNOSIS — K5900 Constipation, unspecified: Secondary | ICD-10-CM | POA: Diagnosis present

## 2014-01-18 DIAGNOSIS — F17200 Nicotine dependence, unspecified, uncomplicated: Secondary | ICD-10-CM | POA: Diagnosis present

## 2014-01-18 DIAGNOSIS — F1110 Opioid abuse, uncomplicated: Secondary | ICD-10-CM

## 2014-01-18 DIAGNOSIS — R7881 Bacteremia: Principal | ICD-10-CM

## 2014-01-18 DIAGNOSIS — B9561 Methicillin susceptible Staphylococcus aureus infection as the cause of diseases classified elsewhere: Secondary | ICD-10-CM

## 2014-01-18 DIAGNOSIS — I10 Essential (primary) hypertension: Secondary | ICD-10-CM | POA: Diagnosis present

## 2014-01-18 DIAGNOSIS — Z720 Tobacco use: Secondary | ICD-10-CM

## 2014-01-18 LAB — CREATININE, SERUM
CREATININE: 0.73 mg/dL (ref 0.50–1.35)
GFR calc Af Amer: >90 mL/min (ref >90)
GFR calc non Af Amer: >90 mL/min (ref >90)

## 2014-01-18 LAB — CBC
HCT: 43 % (ref 39.0–52.0)
HEMOGLOBIN: 14.7 g/dL (ref 13.0–17.0)
MCH: 33.1 pg (ref 26.0–34.0)
MCHC: 34.2 g/dL (ref 30.0–36.0)
MCV: 96.8 fL (ref 78.0–100.0)
Platelets: 281 10*3/uL (ref 150–400)
RBC: 4.44 MIL/uL (ref 4.22–5.81)
RDW: 13 % (ref 11.5–15.5)
WBC: 8.6 10*3/uL (ref 4.0–10.5)

## 2014-01-18 MED ORDER — CEFAZOLIN SODIUM-DEXTROSE 2-3 GM-% IV SOLR
INTRAVENOUS | Status: AC
  Filled 2014-01-18: qty 100

## 2014-01-18 MED ORDER — MORPHINE SULFATE 2 MG/ML IJ SOLN
2.0000 mg | INTRAMUSCULAR | Status: DC | PRN
Start: 2014-01-18 — End: 2014-01-18
  Administered 2014-01-18 (×2): 2 mg via INTRAVENOUS
  Filled 2014-01-18 (×2): qty 1

## 2014-01-18 MED ORDER — PNEUMOCOCCAL VAC POLYVALENT 25 MCG/0.5ML IJ INJ
0.5000 mL | INJECTION | INTRAMUSCULAR | Status: AC
  Administered 2014-01-19: 0.5 mL via INTRAMUSCULAR
  Filled 2014-01-18: qty 0.5

## 2014-01-18 MED ORDER — HEPARIN SODIUM (PORCINE) 5000 UNIT/ML IJ SOLN
5000.0000 [IU] | Freq: Three times a day (TID) | INTRAMUSCULAR | Status: DC
Start: 2014-01-18 — End: 2014-01-21
  Administered 2014-01-18 – 2014-01-21 (×10): 5000 [IU] via SUBCUTANEOUS
  Filled 2014-01-18 (×10): qty 1

## 2014-01-18 MED ORDER — ALPRAZOLAM 1 MG PO TABS
1.0000 mg | ORAL_TABLET | Freq: Three times a day (TID) | ORAL | Status: DC
  Administered 2014-01-18 – 2014-01-21 (×9): 1 mg via ORAL
  Filled 2014-01-18 (×9): qty 1

## 2014-01-18 MED ORDER — ACETAMINOPHEN 650 MG RE SUPP: 650.0000 mg | Freq: Four times a day (QID) | RECTAL | Status: DC | PRN

## 2014-01-18 MED ORDER — VANCOMYCIN HCL IN DEXTROSE 1-5 GM/200ML-% IV SOLN
1000.0000 mg | Freq: Three times a day (TID) | INTRAVENOUS | Status: DC
  Administered 2014-01-18 – 2014-01-19 (×3): 1000 mg via INTRAVENOUS
  Filled 2014-01-18 (×8): qty 200

## 2014-01-18 MED ORDER — ONDANSETRON HCL 4 MG/2ML IJ SOLN: 4.0000 mg | Freq: Four times a day (QID) | INTRAMUSCULAR | Status: DC | PRN

## 2014-01-18 MED ORDER — HYDROMORPHONE HCL PF 1 MG/ML IJ SOLN
0.5000 mg | INTRAMUSCULAR | Status: DC | PRN
  Administered 2014-01-18 – 2014-01-21 (×14): 1 mg via INTRAVENOUS
  Filled 2014-01-18 (×14): qty 1

## 2014-01-18 MED ORDER — CEFAZOLIN SODIUM-DEXTROSE 2-3 GM-% IV SOLR
INTRAVENOUS | Status: AC
  Filled 2014-01-18: qty 50

## 2014-01-18 MED ORDER — ONDANSETRON HCL 4 MG PO TABS: 4.0000 mg | ORAL_TABLET | Freq: Four times a day (QID) | ORAL | Status: DC | PRN

## 2014-01-18 MED ORDER — ASPIRIN EC 81 MG PO TBEC
81.0000 mg | DELAYED_RELEASE_TABLET | Freq: Every day | ORAL | Status: DC
  Administered 2014-01-18 – 2014-01-21 (×4): 81 mg via ORAL
  Filled 2014-01-18 (×4): qty 1

## 2014-01-18 MED ORDER — VANCOMYCIN HCL IN DEXTROSE 1-5 GM/200ML-% IV SOLN: 1000.0000 mg | Freq: Once | INTRAVENOUS | Status: DC

## 2014-01-18 MED ORDER — METAXALONE 800 MG PO TABS
400.0000 mg | ORAL_TABLET | Freq: Three times a day (TID) | ORAL | Status: DC
  Administered 2014-01-18 – 2014-01-20 (×8): 400 mg via ORAL
  Filled 2014-01-18 (×8): qty 1

## 2014-01-18 MED ORDER — ACETAMINOPHEN 325 MG PO TABS: 650.0000 mg | ORAL_TABLET | Freq: Four times a day (QID) | ORAL | Status: DC | PRN

## 2014-01-18 MED ORDER — CEFAZOLIN SODIUM-DEXTROSE 2-3 GM-% IV SOLR
2.0000 g | Freq: Three times a day (TID) | INTRAVENOUS | Status: DC
  Administered 2014-01-18 – 2014-01-21 (×9): 2 g via INTRAVENOUS
  Filled 2014-01-18 (×13): qty 50

## 2014-01-18 NOTE — Telephone Encounter (Signed)
Lab called positive blood cultures staph aureus in all four bottles. Dr Elesa MassedWard notified who instructed RN to call patient and have them return ASAP. Attempt to contact patient, left voicemail message to urgently call flow managers #

## 2014-01-18 NOTE — ED Notes (Signed)
Pt was called to return to ED for positive blood cultures that were drawn on Thursday.

## 2014-01-18 NOTE — Telephone Encounter (Signed)
Patient returned call, ID verified. Notified of blood cultures now positive for staph aureus. Pt confirmed speaking with Richarda Osmondoni F. RN yesterday regarding positive blood cultures and need to return to ED, but states he hasn't seen MD or returned to ED. Instructed patient to return to ED and stressed importance. Patient verbalized understanding.

## 2014-01-18 NOTE — Progress Notes (Signed)
Pt. Requested to have foley catheter removed.  Foley was removed with no difficulty.  Patient instructed to save urine so we can measure.  Will continue to monitor patient.

## 2014-01-18 NOTE — H&P (Addendum)
[facility] History and Physical  [name] [name]:[id number] DOB: 1976/04/07 DOA: 01/18/2014  Referring physician: Emergency Department PCP: No primary provider on file.  Specialists:   Chief Complaint: Back Pain  HPI: [name] [name] is a 38 y.o. male  Who presents to the ED for follow up for positive blood cultures. Pt initially presented to ED two days prior to admit with complaints of acute lower back pain and fevers. Pan cultures were obtained at that time. MRI of the lower back revealed a small L4-5 disc protrusion with no evidence of infection. Pt was discharged home. Blood cultures later returned pos for staph aureus, and the patient was asked to return to ED. In ED, pt was afebrile w/o leukocytosis. On further questioning, pt initially denied any illicit drug use, however pt eventually admitted to IV heroin, last use 10 days ago. Pt was started on empiric vanc and the hospitalist consuted for admission.  Review of Systems:   Per above, the remainder of the 10pt ros reviewed and are neg  Past Medical History  Diagnosis Date  . Hypertension   . Anxiety    History reviewed. No pertinent past surgical history. Social History:  reports that he has been smoking.  He does not have any smokeless tobacco history on file. He reports that he drinks alcohol. He reports that he does not use illicit drugs.  where does patient live--home, ALF, SNF? and with whom if at home?  Can patient participate in ADLs?  No Known Allergies  History reviewed. No pertinent family history. reviewed and is noncontributory to this particular case (be sure to complete)  Prior to Admission medications   Medication Sig Start Date End Date Taking? Authorizing Provider  ALPRAZolam Prudy Feeler) 1 MG tablet Take 1 mg by mouth 3 (three) times daily.    Yes Historical Provider, MD  aspirin EC 81 MG tablet Take 81 mg by mouth daily.   Yes Historical Provider, MD  ibuprofen (ADVIL,MOTRIN) 800 MG tablet Take 1  tablet (800 mg total) by mouth 3 (three) times daily. 01/16/14  Yes Kristen N Ward, DO  oxyCODONE-acetaminophen (PERCOCET/ROXICET) 5-325 MG per tablet Take 1-2 tablets by mouth every 6 (six) hours as needed. 01/16/14  Yes Layla Maw Ward, DO   Physical Exam: Filed Vitals:   01/18/14 1331  BP: 141/80  Pulse: 86  Temp: 97.9 F (36.6 C)  TempSrc: Oral  Height: 6' (1.829 m)  Weight: 81.647 kg (180 lb)  SpO2: 99%     General:  Awake, in nad  Eyes: PERRL B  ENT: membranes moist, dentition poor, no oral lesions  Neck: supple, trachea midline  Cardiovascular: regular, no murmurs, s1, s2  Respiratory: normal resp effort, no wheezing  Abdomen: soft, nondistended  Skin: normal skin turgor, no abnormal skin lesions seen  Musculoskeletal: perfused, no clubbing  Psychiatric: mood/affect normal// no auditory/visual hallucinations  Neurologic: cn2-12 grossly intact, strength/sensation intact  Labs on Admission:  Basic Metabolic Panel:  Recent Labs Lab 01/16/14 1341 01/18/14 1345  NA 137  --   K 4.2  --   CL 99  --   CO2 27  --   GLUCOSE 181*  --   BUN 6  --   CREATININE 0.84 0.73  CALCIUM 9.4  --    Liver Function Tests: No results found for this basename: AST, ALT, ALKPHOS, BILITOT, PROT, ALBUMIN,  in the last 168 hours No results found for this basename: LIPASE, AMYLASE,  in the last 168 hours No results  found for this basename: AMMONIA,  in the last 168 hours CBC:  Recent Labs Lab 01/16/14 1341 01/18/14 1345  WBC 9.6 8.6  NEUTROABS 7.6  --   HGB 14.7 14.7  HCT 43.2 43.0  MCV 98.2 96.8  PLT 199 281   Cardiac Enzymes: No results found for this basename: CKTOTAL, CKMB, CKMBINDEX, TROPONINI,  in the last 168 hours  BNP (last 3 results) No results found for this basename: PROBNP,  in the last 8760 hours CBG: No results found for this basename: GLUCAP,  in the last 168 hours  Radiological Exams on Admission: Mr Lumbar Spine W Wo Contrast  01/16/2014   CLINICAL  DATA:  Fever, low back pain, and urinary retention.  EXAM: MRI LUMBAR SPINE WITHOUT AND WITH CONTRAST  TECHNIQUE: Multiplanar and multiecho pulse sequences of the lumbar spine were obtained without and with intravenous contrast.  CONTRAST:  15mL MULTIHANCE GADOBENATE DIMEGLUMINE 529 MG/ML IV SOLN  COMPARISON:  None.  FINDINGS: There is transitional lumbosacral anatomy. For this dictation, the lowest well-formed intervertebral disc space will be labeled L5-S1, with a right-sided assimilation joint present at this level.  Vertebral alignment is normal. Vertebral body heights are preserved. Vertebral bone marrow signal is within normal limits. Disc desiccation and mild disc space narrowing are present at L4-5. Other intervertebral discs are well hydrated and maintained in height. The conus medullaris is normal in signal and terminates at the superior aspect of L1. No epidural abscess or other fluid collection is seen. Paraspinal soft tissues are unremarkable.  L1-2 through L3-4:  Negative.  L4-5: Small left central disc protrusion results in narrowing of the left-greater-than-right lateral recesses and contacts the left L5 nerve root in the lateral recess. There is a small amount of enhancement at the left aspect of the herniated disc. No spinal canal or neural foraminal stenosis. Mild facet arthrosis.  L5-S1:  Negative.  IMPRESSION: Small L4-5 disc protrusion, which contacts the left L5 nerve root in the lateral recess without evidence of significant mass effect. No evidence of infection in the lumbar spine.   Electronically Signed   By: Sebastian AcheAllen  Grady   On: 01/16/2014 16:14     Assessment/Plan Principal Problem:   Bacteremia Active Problems:   Tobacco abuse   Fever   Staphylococcus aureus bacteremia  1. Staph bacteremia 1. Will cont on empiric vancomycin 2. With recent fevers and admitted IV drug use, endocarditis is a concern 3. Will obtain 2d echo, may ultimately require TTE 4. Admit to  med-surg 2. Back pain 1. No signs of infection on recent MRI 2. Disc protrusion noted on MRI instead 3. Per sig other at bedside, the patient engaged in vigorous sexual activity roughly five hours prior to onset of acute back pain 4. Sx improved with heat, per pt 5. Will cont with analgesics and will try muscle relaxant 3. Tobacco abuse 1. Cessation done at bedside 2. Pt declines nicotine patch at this time 4. Heroin abuse 1. Addressed at bedside 2. Pt knows to avoid in future 5. DVT prophylaxis 1. Heparin subQ  Code Status: Full (must indicate code status--if unknown or must be presumed, indicate so) Family Communication: Pt in room (indicate person spoken with, if applicable, with phone number if by telephone) Disposition Plan: Pending (indicate anticipated LOS)  Time spent: 40min  Lyon Dumont K [facility] Pager [phone]  If 7PM-7AM, please contact night-coverage [email or web address] Password [email or web address] 01/18/2014, 2:42 PM

## 2014-01-18 NOTE — ED Notes (Signed)
Dr. Rhona Leavenshiu paged to [phone]

## 2014-01-18 NOTE — Progress Notes (Addendum)
ANTIBIOTIC CONSULT NOTE - INITIAL  Pharmacy Consult for Vancomycin & Cefazolin  Indication: Bacteremia  No Known Allergies  Patient Measurements: Height: 6' (182.9 cm) Weight: 180 lb (81.647 kg) IBW/kg (Calculated) : 77.6 Adjusted Body Weight:   Vital Signs: Temp: 97.9 F (36.6 C) (08/08 1331) Temp src: Oral (08/08 1331) BP: 141/80 mmHg (08/08 1331) Pulse Rate: 86 (08/08 1331) Intake/Output from previous day:   Intake/Output from this shift:    Labs:  Recent Labs  01/16/14 1341  WBC 9.6  HGB 14.7  PLT 199  CREATININE 0.84   Estimated Creatinine Clearance: 133.4 ml/min (by C-G formula based on Cr of 0.84). No results found for this basename: VANCOTROUGH, Leodis BinetVANCOPEAK, VANCORANDOM, GENTTROUGH, GENTPEAK, GENTRANDOM, TOBRATROUGH, TOBRAPEAK, TOBRARND, AMIKACINPEAK, AMIKACINTROU, AMIKACIN,  in the last 72 hours   Microbiology: Recent Results (from the past 720 hour(s))  CULTURE, BLOOD (ROUTINE X 2)     Status: None   Collection Time    01/16/14  5:33 PM      Result Value Ref Range Status   Specimen Description BLOOD RIGHT ARM   Final   Special Requests BOTTLES DRAWN AEROBIC AND ANAEROBIC [facility]   Final   Culture  Setup Time     Final   Value: 01/17/2014 20:08     Performed at [facility]   Culture     Final   Value: STAPHYLOCOCCUS AUREUS     Note: RIFAMPIN AND GENTAMICIN SHOULD NOT BE USED AS SINGLE DRUGS FOR TREATMENT OF STAPH INFECTIONS.     Note: Gram Stain Report Called to,Read Back By and Verified With: L MILLER AT 1410 ON 01/17/14 BY S VANHOORNE     Performed at [facility]   Report Status PENDING   Incomplete  CULTURE, BLOOD (ROUTINE X 2)     Status: None   Collection Time    01/16/14  5:38 PM      Result Value Ref Range Status   Specimen Description BLOOD LEFT ARM   Final   Special Requests BOTTLES DRAWN AEROBIC AND ANAEROBIC [facility]7CC   Final   Culture  Setup Time     Final   Value: 01/17/2014 15:17     Performed at [facility]   Culture      Final   Value: STAPHYLOCOCCUS AUREUS     Note: CRITICAL RESULT CALLED TO, READ BACK BY AND VERIFIED WITH: SHANNON GAMMONS 01/18/14 @ 9:48AM BY RUSCOE A.     Performed at [facility]   Report Status PENDING   Incomplete    Medical History: Past Medical History  Diagnosis Date  . Hypertension   . Anxiety     Medications:  Scheduled:  . heparin  5,000 Units Subcutaneous 3 times per day   Assessment: Patient returns to ED due to positive blood culture, staph aureus Pharmacy consult for Vancomycin Excellent renal function  Goal of Therapy:  Vancomycin trough level 15-20 mcg/ml  Plan:  Cefazolin 2 GM IV every 8 hours Vancomycin 1 GM IV every 8 hours Vancomycin trough at steady state Monitor renal function Labs per protocol  [name] [name], [name] [name] 01/18/2014,2:19 PM

## 2014-01-18 NOTE — ED Provider Notes (Signed)
CSN: [id number]     Arrival date & time 01/18/14  1320 History  This chart was scribed for Geoffery Lyonsouglas Therron Sells, MD by Leone PayorSonum Patel, ED Scribe. This patient was seen in room APA04/APA04 and the patient's care was started 1:54 PM.    Chief Complaint  Patient presents with  . Follow-up    The history is provided by the patient. No language interpreter was used.    HPI Comments: [name] [name] is a 38 y.o. male who presents to the Emergency Department for follow up after positive blood cultures that were drawn 2 days ago. He was seen on 8/6 for constant, unchanged lower back pain that radiates to the right knee. He had an MRI performed at that time which showed a herniated disc. Patient states he has been taking ibuprofen; last dose this morning. He denies recent dental work. He denies fever, difficulty ambulating.    Past Medical History  Diagnosis Date  . Hypertension   . Anxiety    History reviewed. No pertinent past surgical history. History reviewed. No pertinent family history. History  Substance Use Topics  . Smoking status: Current Every Day Smoker -- 0.50 packs/day  . Smokeless tobacco: Not on file  . Alcohol Use: Yes     Comment: occ    Review of Systems  A complete 10 system review of systems was obtained and all systems are negative except as noted in the HPI and PMH.    Allergies  Review of patient's allergies indicates no known allergies.  Home Medications   Prior to Admission medications   Medication Sig Start Date End Date Taking? Authorizing Provider  ALPRAZolam Prudy Feeler(XANAX) 1 MG tablet Take 1 mg by mouth 3 (three) times daily.     Historical Provider, MD  diazepam (VALIUM) 5 MG tablet Take 1 tablet (5 mg total) by mouth every 8 (eight) hours as needed for muscle spasms. 01/16/14   Kristen N Ward, DO  doxycycline (VIBRAMYCIN) 50 MG capsule Take 1 capsule (50 mg total) by mouth 2 (two) times daily. 01/16/14   Kristen N Ward, DO  ibuprofen (ADVIL,MOTRIN) 800 MG tablet Take 1  tablet (800 mg total) by mouth 3 (three) times daily. 01/16/14   Kristen N Ward, DO  Oxycodone-Acetaminophen (PERCOCET PO) Take 1 tablet by mouth as needed (pain).    Historical Provider, MD  oxyCODONE-acetaminophen (PERCOCET/ROXICET) 5-325 MG per tablet Take 1-2 tablets by mouth every 6 (six) hours as needed. 01/16/14   Kristen N Ward, DO   BP 141/80  Pulse 86  Temp(Src) 97.9 F (36.6 C) (Oral)  Ht 6' (1.829 m)  Wt 180 lb (81.647 kg)  BMI 24.41 kg/m2  SpO2 99% Physical Exam  Nursing note and vitals reviewed. Constitutional: He is oriented to person, place, and time. He appears well-developed and well-nourished.  HENT:  Head: Normocephalic and atraumatic.  Cardiovascular: Normal rate, regular rhythm and normal heart sounds.   Pulmonary/Chest: Effort normal and breath sounds normal. No respiratory distress. He has no wheezes. He has no rales.  Abdominal: Soft. He exhibits no distension. There is no tenderness.  Neurological: He is alert and oriented to person, place, and time. He has normal strength. No cranial nerve deficit. He exhibits normal muscle tone. Coordination normal.  Skin: Skin is warm and dry.  Psychiatric: He has a normal mood and affect.    ED Course  Procedures (including critical care time)  DIAGNOSTIC STUDIES: Oxygen Saturation is 99% on RA, normal by my interpretation.    COORDINATION  OF CARE: 1:59 PM Discussed treatment plan with pt at bedside and pt agreed to plan.   Labs Review Labs Reviewed - No data to display  Imaging Review Mr Lumbar Spine W Wo Contrast  01/16/2014   CLINICAL DATA:  Fever, low back pain, and urinary retention.  EXAM: MRI LUMBAR SPINE WITHOUT AND WITH CONTRAST  TECHNIQUE: Multiplanar and multiecho pulse sequences of the lumbar spine were obtained without and with intravenous contrast.  CONTRAST:  15mL MULTIHANCE GADOBENATE DIMEGLUMINE 529 MG/ML IV SOLN  COMPARISON:  None.  FINDINGS: There is transitional lumbosacral anatomy. For this  dictation, the lowest well-formed intervertebral disc space will be labeled L5-S1, with a right-sided assimilation joint present at this level.  Vertebral alignment is normal. Vertebral body heights are preserved. Vertebral bone marrow signal is within normal limits. Disc desiccation and mild disc space narrowing are present at L4-5. Other intervertebral discs are well hydrated and maintained in height. The conus medullaris is normal in signal and terminates at the superior aspect of L1. No epidural abscess or other fluid collection is seen. Paraspinal soft tissues are unremarkable.  L1-2 through L3-4:  Negative.  L4-5: Small left central disc protrusion results in narrowing of the left-greater-than-right lateral recesses and contacts the left L5 nerve root in the lateral recess. There is a small amount of enhancement at the left aspect of the herniated disc. No spinal canal or neural foraminal stenosis. Mild facet arthrosis.  L5-S1:  Negative.  IMPRESSION: Small L4-5 disc protrusion, which contacts the left L5 nerve root in the lateral recess without evidence of significant mass effect. No evidence of infection in the lumbar spine.   Electronically Signed   By: Sebastian Ache   On: 01/16/2014 16:14     EKG Interpretation None      MDM   Final diagnoses:  None    Patient presents at the request of the flow manager do to positive blood cultures. He was seen for fever and back pain 2 days ago and had workup performed. His MRI was negative. His blood cultures apparently grew staph aureus. From reviewing his chart, it appears as though he has a history of heroin abuse and I suspect is positive blood cultures are related to IV drug use. I've spoken with Dr. Rhona Leavens from the hospitalist service who agrees to admit the patient.  I personally performed the services described in this documentation, which was scribed in my presence. The recorded information has been reviewed and is accurate.       Geoffery Lyons, MD 01/18/14 [phone]

## 2014-01-18 NOTE — ED Notes (Signed)
Patient would like some pain medicine RN aware.

## 2014-01-19 DIAGNOSIS — I517 Cardiomegaly: Secondary | ICD-10-CM

## 2014-01-19 LAB — COMPREHENSIVE METABOLIC PANEL
ALBUMIN: 3.5 g/dL (ref 3.5–5.2)
ALK PHOS: 142 U/L — AB (ref 39–117)
ALT: 13 U/L (ref 0–53)
ANION GAP: 14 (ref 5–15)
AST: 15 U/L (ref 0–37)
BUN: 7 mg/dL (ref 6–23)
CHLORIDE: 100 meq/L (ref 96–112)
CO2: 25 mEq/L (ref 19–32)
Calcium: 9.2 mg/dL (ref 8.4–10.5)
Creatinine, Ser: 0.65 mg/dL (ref 0.50–1.35)
GFR calc Af Amer: >90 mL/min (ref >90)
GFR calc non Af Amer: >90 mL/min (ref >90)
Glucose, Bld: 108 mg/dL — ABNORMAL HIGH (ref 70–99)
POTASSIUM: 3.5 meq/L — AB (ref 3.7–5.3)
Sodium: 139 mEq/L (ref 137–147)
Total Bilirubin: 0.8 mg/dL (ref 0.3–1.2)
Total Protein: 7 g/dL (ref 6.0–8.3)

## 2014-01-19 LAB — CULTURE, BLOOD (ROUTINE X 2)

## 2014-01-19 LAB — RAPID URINE DRUG SCREEN, HOSP PERFORMED
AMPHETAMINES: NOT DETECTED
BARBITURATES: NOT DETECTED
Benzodiazepines: POSITIVE — AB
COCAINE: NOT DETECTED
OPIATES: POSITIVE — AB
Tetrahydrocannabinol: NOT DETECTED

## 2014-01-19 LAB — CBC
HCT: 38 % — ABNORMAL LOW (ref 39.0–52.0)
HEMOGLOBIN: 12.7 g/dL — AB (ref 13.0–17.0)
MCH: 32.3 pg (ref 26.0–34.0)
MCHC: 33.4 g/dL (ref 30.0–36.0)
MCV: 96.7 fL (ref 78.0–100.0)
Platelets: 274 10*3/uL (ref 150–400)
RBC: 3.93 MIL/uL — ABNORMAL LOW (ref 4.22–5.81)
RDW: 12.8 % (ref 11.5–15.5)
WBC: 7.6 10*3/uL (ref 4.0–10.5)

## 2014-01-19 LAB — VANCOMYCIN, TROUGH: Vancomycin Tr: 7.5 ug/mL — ABNORMAL LOW (ref 10.0–20.0)

## 2014-01-19 LAB — HIV ANTIBODY (ROUTINE TESTING W REFLEX): HIV 1&2 Ab, 4th Generation: NONREACTIVE

## 2014-01-19 MED ORDER — VANCOMYCIN HCL 10 G IV SOLR
1250.0000 mg | Freq: Three times a day (TID) | INTRAVENOUS | Status: DC
  Administered 2014-01-19: 1250 mg via INTRAVENOUS
  Filled 2014-01-19 (×3): qty 1250

## 2014-01-19 MED ORDER — KETOROLAC TROMETHAMINE 15 MG/ML IJ SOLN
15.0000 mg | Freq: Four times a day (QID) | INTRAMUSCULAR | Status: DC | PRN
  Administered 2014-01-19: 15 mg via INTRAVENOUS
  Filled 2014-01-19: qty 1

## 2014-01-19 MED ORDER — TAMSULOSIN HCL 0.4 MG PO CAPS
0.4000 mg | ORAL_CAPSULE | Freq: Every day | ORAL | Status: DC
  Administered 2014-01-19 – 2014-01-21 (×3): 0.4 mg via ORAL
  Filled 2014-01-19 (×3): qty 1

## 2014-01-19 MED ORDER — MAGNESIUM HYDROXIDE 400 MG/5ML PO SUSP
960.0000 mL | Freq: Once | ORAL | Status: DC
  Filled 2014-01-19: qty 240

## 2014-01-19 MED ORDER — SORBITOL 70 % SOLN
30.0000 mL | Status: AC
  Administered 2014-01-19: 30 mL via ORAL
  Filled 2014-01-19: qty 30

## 2014-01-19 MED ORDER — BISACODYL 10 MG RE SUPP
10.0000 mg | Freq: Once | RECTAL | Status: AC
  Administered 2014-01-19: 10 mg via RECTAL
  Filled 2014-01-19: qty 1

## 2014-01-19 MED ORDER — LACTULOSE 10 GM/15ML PO SOLN
30.0000 g | ORAL | Status: AC
  Administered 2014-01-19: 30 g via ORAL
  Filled 2014-01-19: qty 60

## 2014-01-19 MED ORDER — POLYETHYLENE GLYCOL 3350 17 G PO PACK
17.0000 g | PACK | Freq: Every day | ORAL | Status: DC
  Administered 2014-01-19: 17 g via ORAL
  Filled 2014-01-19 (×2): qty 1

## 2014-01-19 NOTE — Progress Notes (Signed)
Utilization review Completed Oshua Mcconaha RN BSN   

## 2014-01-19 NOTE — Progress Notes (Signed)
[facility] PROGRESS NOTE  [name] LimboDoyle T Burgeson ZOX:[id number] DOB: 09-12-1975 DOA: 01/18/2014 PCP: No primary provider on file.  Assessment/Plan:  1. Staph bacteremia  1. Cont on empiric vancomycin 2. With recent fevers and admitted IV drug use, endocarditis is a strong concern 3. 2d echo done this AM, follow results 4. Likely will require TTE 2. Back pain  1. No signs of infection on recent MRI 2. Disc protrusion noted on MRI instead 3. Per sig other at bedside, the patient engaged in vigorous sexual activity roughly five hours prior to onset of acute back pain 4. Sx improved with heat, per pt 5. Cont with analgesics and will try muscle relaxant 3. Tobacco abuse  1. Cessation done at bedside 2. Pt declines nicotine patch at this time 4. Heroin abuse  1. Addressed at bedside 2. Pt knows to avoid in future 5. DVT prophylaxis  1. Heparin subQ 6. Constipation 1. No BM in 4-5 days 2. Will order daily miralax with cathartics as needed  Code Status: Full Family Communication: Pt and significant other in room Disposition Plan: Pending   Consultants:    Procedures:    Antibiotics:  Vancomycin 8/8>>>  HPI/Subjective: Reports marked low back pain  Objective: Filed Vitals:   01/18/14 1511 01/18/14 1551 01/18/14 2110 01/19/14 0542  BP: 140/90 132/84 129/84 135/96  Pulse: 73 84 80 91  Temp:  98 F (36.7 C) 98.4 F (36.9 C) 99.5 F (37.5 C)  TempSrc:  Oral Oral Oral  Resp: 14 16 18 16   Height:  6' (1.829 m)    Weight:  77.792 kg (171 lb 8 oz)    SpO2: 98% 100% 100% 99%    Intake/Output Summary (Last 24 hours) at 01/19/14 0936 Last data filed at 01/19/14 0600  Gross per 24 hour  Intake   1540 ml  Output    825 ml  Net    715 ml   Filed Weights   01/18/14 1331 01/18/14 1551  Weight: 81.647 kg (180 lb) 77.792 kg (171 lb 8 oz)    Exam:   General:  Awake, in nad  Cardiovascular: regular, s1, s2  Respiratory: normal resp effort, no  wheezing  Abdomen: soft, nondistended  Musculoskeletal: perfused, no clubbing   Data Reviewed: Basic Metabolic Panel:  Recent Labs Lab 01/16/14 1341 01/18/14 1345 01/19/14 0545  NA 137  --  139  K 4.2  --  3.5*  CL 99  --  100  CO2 27  --  25  GLUCOSE 181*  --  108*  BUN 6  --  7  CREATININE 0.84 0.73 0.65  CALCIUM 9.4  --  9.2   Liver Function Tests:  Recent Labs Lab 01/19/14 0545  AST 15  ALT 13  ALKPHOS 142*  BILITOT 0.8  PROT 7.0  ALBUMIN 3.5   No results found for this basename: LIPASE, AMYLASE,  in the last 168 hours No results found for this basename: AMMONIA,  in the last 168 hours CBC:  Recent Labs Lab 01/16/14 1341 01/18/14 1345 01/19/14 0545  WBC 9.6 8.6 7.6  NEUTROABS 7.6  --   --   HGB 14.7 14.7 12.7*  HCT 43.2 43.0 38.0*  MCV 98.2 96.8 96.7  PLT 199 281 274   Cardiac Enzymes: No results found for this basename: CKTOTAL, CKMB, CKMBINDEX, TROPONINI,  in the last 168 hours BNP (last 3 results) No results found for this basename: PROBNP,  in the last 8760 hours CBG: No results found for this  basename: GLUCAP,  in the last 168 hours  Recent Results (from the past 240 hour(s))  CULTURE, BLOOD (ROUTINE X 2)     Status: None   Collection Time    01/16/14  5:33 PM      Result Value Ref Range Status   Specimen Description BLOOD RIGHT ARM   Final   Special Requests BOTTLES DRAWN AEROBIC AND ANAEROBIC [facility]   Final   Culture  Setup Time     Final   Value: 01/17/2014 20:08     Performed at [facility]   Culture     Final   Value: STAPHYLOCOCCUS AUREUS     Note: RIFAMPIN AND GENTAMICIN SHOULD NOT BE USED AS SINGLE DRUGS FOR TREATMENT OF STAPH INFECTIONS.     Note: Gram Stain Report Called to,Read Back By and Verified With: L MILLER AT 1410 ON 01/17/14 BY S VANHOORNE     Performed at [facility]   Report Status PENDING   Incomplete  CULTURE, BLOOD (ROUTINE X 2)     Status: None   Collection Time    01/16/14  5:38 PM      Result  Value Ref Range Status   Specimen Description BLOOD LEFT ARM   Final   Special Requests BOTTLES DRAWN AEROBIC AND ANAEROBIC [facility]   Final   Culture  Setup Time     Final   Value: 01/17/2014 15:17     Performed at [facility]   Culture     Final   Value: STAPHYLOCOCCUS AUREUS     Note: CRITICAL RESULT CALLED TO, READ BACK BY AND VERIFIED WITH: SHANNON GAMMONS 01/18/14 @ 9:48AM BY RUSCOE A.     Performed at [facility]   Report Status PENDING   Incomplete     Studies: No results found.  Scheduled Meds: . ALPRAZolam  1 mg Oral TID  . aspirin EC  81 mg Oral Daily  .  ceFAZolin (ANCEF) IV  2 g Intravenous Q8H  . heparin  5,000 Units Subcutaneous 3 times per day  . metaxalone  400 mg Oral TID  . pneumococcal 23 valent vaccine  0.5 mL Intramuscular Tomorrow-1000  . tamsulosin  0.4 mg Oral Daily  . vancomycin  1,000 mg Intravenous Q8H   Continuous Infusions:   Principal Problem:   Bacteremia Active Problems:   Tobacco abuse   Fever   Staphylococcus aureus bacteremia  Time spent:  CHIU, STEPHEN K  [facility] Pager [phone]. If 7PM-7AM, please contact night-coverage at [email or web address], password [facility] 01/19/2014, 9:36 AM  LOS: 1 day

## 2014-01-19 NOTE — Consult Note (Signed)
    [facility]        [place] Antimicrobial Management Team Staphylococcus aureus bacteremia   Staphylococcus aureus bacteremia (SAB) is associated with a high rate of complications and mortality.  Specific aspects of clinical management are critical to optimizing the outcome of patients with SAB.  Therefore, the [facility] Antimicrobial Management Team [facility]) has initiated an intervention aimed at improving the management of SAB at [facility].  To do so, Infectious Diseases physicians are providing an evidence-based consult for the management of all patients with SAB.     Yes No Comments  Perform follow-up blood cultures (even if the patient is afebrile) to ensure clearance of bacteremia [x]  []  REPEAT BLOOD CULTURES ORDERED  Remove vascular catheter and obtain follow-up blood cultures after the removal of the catheter []  []  DO NOT PLACE PICC AND NOT SURE GOOD IDEA REGARDLESS LONGTERM GIVEN IVDU HX  Perform echocardiography to evaluate for endocarditis (transthoracic ECHO is 40-50% sensitive, TEE is > 90% sensitive) []  []  Please keep in mind, that neither test can definitively EXCLUDE endocarditis, and that should clinical suspicion remain high for endocarditis the patient should then still be treated with an "endocarditis" duration of therapy = 6 weeks   NEED TEE  Consult electrophysiologist to evaluate implanted cardiac device (pacemaker, ICD) []  []  NA  Ensure source control []  []  Have all abscesses been drained effectively? Have deep seeded infections (septic joints or osteomyelitis) had appropriate surgical debridement?  Investigate for "metastatic" sites of infection []  []  Does the patient have ANY symptom or physical exam finding that would suggest a deeper infection (back or neck pain that may be suggestive of vertebral osteomyelitis or epidural abscess, muscle pain that could be a symptom of pyomyositis)?  Keep in mind that for deep seeded infections MRI  imaging with contrast is preferred rather than other often insensitive tests such as plain x-rays, especially early in a patient's presentation. MRI SPINE WAS WITHOUT DISKITIS  Change antibiotic therapy to ANCEF 2 GRAMS IV Q 8 HOURS []  []  Beta-lactam antibiotics are preferred for MSSA due to higher cure rates.   If on Vancomycin, goal trough should be 15 - 20 mcg/mL  Estimated duration of IV antibiotic therapy:    4-6 WEEKS IN SNF. DEPENDING UPON TEE, MAY ALSO CONSIDER LONGER ACTING DRUG SUCH AS ORITOVANCIN THOUHT NOT STUDIED FOR ENDOCARDITIS OR BACTEREMIA    []  []  Consult case management for probably prolonged outpatient IV antibiotic therapy   CASE DISCUSSED WITH DR. Rhona LeavensHIU

## 2014-01-19 NOTE — Progress Notes (Signed)
Echo Lab  2D Echocardiogram completed.  Lilliane Sposito L Richmond Coldren, RDCS 01/19/2014 8:13 AM

## 2014-01-19 NOTE — Progress Notes (Signed)
ED/CM noted patient did not have health insurance and/or PCP listed in the computer.  Patient was given the [facility] resources handout with information on the clinics, food pantries, and the handout for new health insurance sign-up.  Also provided drug discount card.  Will need to see a CM once ready for DC from unit [facility]. Patient expressed appreciation for information received.

## 2014-01-19 NOTE — Progress Notes (Signed)
ANTIBIOTIC CONSULT NOTE  Pharmacy Consult for Vancomycin & Cefazolin  Indication: Bacteremia  No Known Allergies  Patient Measurements: Height: 6' (182.9 cm) Weight: 171 lb 8 oz (77.792 kg) IBW/kg (Calculated) : 77.6 Adjusted Body Weight:   Vital Signs: Temp: 99.8 F (37.7 C) (08/09 1414) Temp src: Oral (08/09 1414) BP: 136/83 mmHg (08/09 1414) Pulse Rate: 91 (08/09 1414) Intake/Output from previous day: 08/08 0701 - 08/09 0700 In: 1540 [P.O.:840; IV Piggyback:700] Out: 825 [Urine:825] Intake/Output from this shift:    Labs:  Recent Labs  01/18/14 1345 01/19/14 0545  WBC 8.6 7.6  HGB 14.7 12.7*  PLT 281 274  CREATININE 0.73 0.65   Estimated Creatinine Clearance: 140.1 ml/min (by C-G formula based on Cr of 0.65).  Recent Labs  01/19/14 1343  VANCOTROUGH 7.5*     Microbiology: Recent Results (from the past 720 hour(s))  CULTURE, BLOOD (ROUTINE X 2)     Status: None   Collection Time    01/16/14  5:33 PM      Result Value Ref Range Status   Specimen Description BLOOD RIGHT ARM   Final   Special Requests BOTTLES DRAWN AEROBIC AND ANAEROBIC [facility]   Final   Culture  Setup Time     Final   Value: 01/17/2014 20:08     Performed at [facility]   Culture     Final   Value: STAPHYLOCOCCUS AUREUS     Note: RIFAMPIN AND GENTAMICIN SHOULD NOT BE USED AS SINGLE DRUGS FOR TREATMENT OF STAPH INFECTIONS. This organism is presumed to be Clindamycin resistant based on detection of inducible Clindamycin resistance.     Note: Gram Stain Report Called to,Read Back By and Verified With: L MILLER AT 1410 ON 01/17/14 BY S VANHOORNE     Performed at [facility]   Report Status 01/19/2014 FINAL   Final   Organism ID, Bacteria STAPHYLOCOCCUS AUREUS   Final  CULTURE, BLOOD (ROUTINE X 2)     Status: None   Collection Time    01/16/14  5:38 PM      Result Value Ref Range Status   Specimen Description BLOOD LEFT ARM   Final   Special Requests BOTTLES DRAWN AEROBIC  AND ANAEROBIC [facility]   Final   Culture  Setup Time     Final   Value: 01/17/2014 15:17     Performed at [facility]   Culture     Final   Value: STAPHYLOCOCCUS AUREUS     Note: SUSCEPTIBILITIES PERFORMED ON PREVIOUS CULTURE WITHIN THE LAST 5 DAYS.     Note: CRITICAL RESULT CALLED TO, READ BACK BY AND VERIFIED WITH: SHANNON GAMMONS 01/18/14 @ 9:48AM BY RUSCOE A.     Performed at [facility]   Report Status 01/19/2014 FINAL   Final    Medical History: Past Medical History  Diagnosis Date  . Hypertension   . Anxiety     Medications:  Scheduled:  . ALPRAZolam  1 mg Oral TID  . aspirin EC  81 mg Oral Daily  .  ceFAZolin (ANCEF) IV  2 g Intravenous Q8H  . heparin  5,000 Units Subcutaneous 3 times per day  . metaxalone  400 mg Oral TID  . polyethylene glycol  17 g Oral Daily  . tamsulosin  0.4 mg Oral Daily  . vancomycin  1,250 mg Intravenous Q8H   Assessment: Patient returns to ED due to positive blood culture, staph aureus Pharmacy consult for Vancomycin Excellent renal function Vancomycin  trough below goal  Goal of Therapy:  Vancomycin trough level 15-20 mcg/ml  Plan:  Cefazolin 2 GM IV every 8 hours Increase Vancomycin to 1250 mg IV every 8 hours Vancomycin trough at steady state Monitor renal function Labs per protocol  Raquel JamesPittman, Waino Mounsey Bennett 01/19/2014,2:26 PM

## 2014-01-20 LAB — CBC
HCT: 38.8 % — ABNORMAL LOW (ref 39.0–52.0)
Hemoglobin: 12.9 g/dL — ABNORMAL LOW (ref 13.0–17.0)
MCH: 32.3 pg (ref 26.0–34.0)
MCHC: 33.2 g/dL (ref 30.0–36.0)
MCV: 97 fL (ref 78.0–100.0)
PLATELETS: 258 10*3/uL (ref 150–400)
RBC: 4 MIL/uL — AB (ref 4.22–5.81)
RDW: 12.9 % (ref 11.5–15.5)
WBC: 7.1 10*3/uL (ref 4.0–10.5)

## 2014-01-20 NOTE — Progress Notes (Addendum)
Nutrition Brief Note  Patient identified on the Malnutrition Screening Tool (MST) Report  Wt Readings from Last 15 Encounters:  01/18/14 171 lb 8 oz (77.792 kg)  01/16/14 180 lb (81.647 kg)  05/19/13 180 lb 8.9 oz (81.9 kg)  09/13/12 190 lb (86.183 kg)  04/16/11 190 lb (86.183 kg)   Bernerd LimboDoyle T Feinberg is a 38 y.o. male who presents to the ED for follow up for positive blood cultures. Pt initially presented to ED two days prior to admit with complaints of acute lower back pain and fevers. Pan cultures were obtained at that time. MRI of the lower back revealed a small L4-5 disc protrusion with no evidence of infection. Pt was discharged home. Blood cultures later returned pos for staph aureus, and the patient was asked to return to ED. In ED, pt was afebrile w/o leukocytosis. On further questioning, pt initially denied any illicit drug use, however pt eventually admitted to IV heroin, last use 10 days ago. Pt was started on empiric vanc and the hospitalist consuted for admission.  Spoke with pt and significant other at bedside. Pt reports UBW around 180#. He suspects weight loss is from lack of air conditioning. He reports that [facility] and he has been without air conditioning due to inability to afford repairs.   He reports his appetite has been coming back, due to recent BM. Pt reports he was constipated PTA and complained of early satiety due to this. Additionally, he reports he hurt his back last week and experienced decreased appetite due to pain, but continued to eat well ("I forced myself to eat to make her (significant other) happy"). Home diet is regular, which consists of proteins and a lot of fresh garden vegetables, due to having a garden on their property. Pt reports he generally eats 3 meals per day, but considers himself a "night owl" and does not eat meals at traditional times.   Briefly reviewed components of a healthy diet (which includes vegetables,  fruits, lean proteins, low fat dairy, and whole grains most often. Also encouraged pt to continue to eat well to support healing process. Pt and significant other very grateful for RD visit.   Body mass index is 23.25 kg/(m^2). Patient meets criteria for normal weight range based on current BMI.   Current diet order is regular, patient is consuming approximately 100% of meals at this time. Labs and medications reviewed.   No nutrition interventions warranted at this time. If nutrition issues arise, please consult RD.   Shakema Surita A. Mayford KnifeWilliams, RD, LDN Pager: [phone]

## 2014-01-20 NOTE — Care Management Note (Addendum)
    Page 1 of 2   01/23/2014     7:55:15 AM CARE MANAGEMENT NOTE 01/23/2014  Patient:  [name] [name],[name] [name]   Account Number:  [id number]  Date Initiated:  01/20/2014  Documentation initiated by:  Anibal HendersonBOLDEN,GENEVA  Subjective/Objective Assessment:   Admitted with sepsis. Pt is from home with sign. other, and will return home at D/C.     Action/Plan:   Pt recommended rolling walker, and HHPT, but pt unsure that he can pay for this, but willing for CM to find [facility] who will [facility] this under the indegent plan- will check on this for the pt   Anticipated DC Date:  01/21/2014   Anticipated DC Plan:  HOME/SELF CARE      DC Planning Services  CM consult  MATCH Program      [facility]  HOME HEALTH   Choice offered to / List presented to:  C-1 Patient        HH arranged  HH-2 PT      HH agency  [facility]   Status of service:  Completed, signed off Medicare Important Message given?   (If response is "NO", the following Medicare IM given date fields will be blank) Date Medicare IM given:   Medicare IM given by:   Date Additional Medicare IM given:   Additional Medicare IM given by:    Discharge Disposition:  HOME W HOME HEALTH SERVICES  Per UR Regulation:  Reviewed for med. necessity/level of care/duration of stay  If discussed at Long Length of Stay Meetings, dates discussed:    Comments:  01/23/14 0750 [name], RN BSN CM Late entry for 01/22/14 @ 1230: Pt [facility]/5 [facility]ox. Pt called floor RN ([name] and notified RN that pt was unable to get medication due to cost even with voucher. CM spoke with Dr. Kerry HoughMemon and explained case. MD called ID to determine if another po AB could be used to treat pt that was cheaper than zyvox. ID recommended that pt return to hospital and have PICC inserted for IV AB. MD called pt at home with number pt left with floor RN and explained to pt what treatment needed to happen. Pt  was instructed to hospital. Pt verbalized understanding per MD. CSW is aware that pt man need SNF to ensure compliance with medication and to abstain from drug use until IV AB completed and PICC removed.  01/21/14 1540 Arlyss Queenammy Corri Delapaz, RN BSN CM Pt [facility] (pt does not have insurance). [name] [facility] is aware and will collect pts information from the chart. HH services to start within 48 hours of discharge. No DME needs noted. Pt given MATCH voucher for medication assistance. [facility] and appt documented on AVS. Pt and pts nurse aware of discharge arrangements.  01/20/14 1130 Anibal HendersonGeneva Bolden RN/CM

## 2014-01-20 NOTE — Progress Notes (Signed)
Pt informed me about 1400 that he had fallen in the bathroom; fall was not witnessed. Pt was unaware as to the time he fell, he just knew it was earlier in the day. He said he fell right after the floor had been mopped even though he was wearing slipper socks at the time. MD paged and responded. Pt Denies any injury; VS WNL; pt made a high fall risk, bed alarm currently being utilized. Continue to monitor.

## 2014-01-20 NOTE — Evaluation (Signed)
Physical Therapy Evaluation Patient Details Name: [name] [name] MRN: [id number] DOB: [birth year] Today's Date: 01/20/2014   History of Present Illness  Pt is a 38 year old male Who presents to the ED for follow up for positive blood cultures. Pt initially presented to ED two days prior to admit with complaints of acute lower back pain and fevers. Pan cultures were obtained at that time. MRI of the lower back revealed a small L4-5 disc protrusion with no evidence of infection. Pt was discharged home. Blood cultures later returned pos for staph aureus, and the patient was asked to return to ED. In ED, pt was afebrile w/o leukocytosis. On further questioning, pt initially denied any illicit drug use, however pt eventually admitted to IV heroin, last use 10 days ago. Pt was started on empiric vanc and the hospitalist consuted for admission.  Clinical Impression  Pt presents to PT for assessment of functional mobility skills.   Pt reports onset of back pain over the past couple of days with MRI revealing L4/5 disc protrusion.  Pt reports inability to stand upright secondary to back pain.  No complaints of radicular pain into (B) legs at this time, however pt reports some instances of pain down into his legs; current pain score 9/10 in the low back at this time.  During evaluation, pt was able to complete transfers with min assist/min guard and use of std cane, and was able to amb 150 feet with min guard and std cane.  Noted flexed trunk during gait with decreased step length/cadence secondary to complaints of low back pain.  Gait transitioned to RW with improvements in upright posturing.  Recommend continued PT to address strengthening, pain management, and activity tolerance for improved functional mobility skills, with transition to HHPT at discharge for back/core strengthening secondary to LBP.  Pt would benefit from use of RW for improved gait patterning.      Follow Up Recommendations Home health PT     Equipment Recommendations  Rolling walker with 5" wheels       Precautions / Restrictions Precautions Precautions: Fall Restrictions Weight Bearing Restrictions: No      Mobility  Bed Mobility               General bed mobility comments: Not assessed as pt up in chair prior to PT evaluation  Transfers Overall transfer level: Needs assistance Equipment used: Rolling walker (2 wheeled) Transfers: Sit to/from [employer] Sit to Stand: Min assist Stand pivot transfers: Min guard          Ambulation/Gait Ambulation/Gait assistance: Min guard Ambulation Distance (Feet): 150 Feet Assistive device: Rolling walker (2 wheeled);Straight cane Gait Pattern/deviations: Trunk flexed   Gait velocity interpretation: Below normal speed for age/gender General Gait Details: Gait assessed with std cane for 120 feet, and RW for 30 feet. Noted improved posture/upright standing with gait on RW.       Balance Overall balance assessment: No apparent balance deficits (not formally assessed)                                           Pertinent Vitals/Pain Pain Assessment: 0-10 Pain Score: 9  Pain Location: Low back pain, no complaints of pain into LE at this time (though pt reports radicular pain earlier) Pain Descriptors / Indicators: Tightness;Aching Pain Intervention(s): Patient requesting pain meds-RN notified;Repositioned    Home Living  Family/patient expects to be discharged to:: Private residence Living Arrangements: Spouse/significant other;Parent;Children (Lives with wife, son, mother) Available Help at Discharge: Family;Available 24 hours/day Type of Home: House Home Access: Stairs to enter Entrance Stairs-Rails: None Entrance Stairs-Number of Steps: 1 Home Layout: One level Home Equipment: Grab bars - tub/shower (Walking stick) Additional Comments: Tub shower    Prior Function Level of Independence: Independent                Hand Dominance   Dominant Hand: Right (Rt for writing, Lt for reaching/throwing)    Extremity/Trunk Assessment               Lower Extremity Assessment: Generalized weakness;RLE deficits/detail;LLE deficits/detail RLE Deficits / Details: Pain with hip flexion during MMT testing LLE Deficits / Details: Pain with hip flexion during MMT testing  Cervical / Trunk Assessment: Other exceptions  Communication   Communication: No difficulties  Cognition Arousal/Alertness: Awake/alert Behavior During Therapy: WFL for tasks assessed/performed Overall Cognitive Status: Within Functional Limits for tasks assessed                               Assessment/Plan    PT Assessment Patient needs continued PT services  PT Diagnosis Difficulty walking;Acute pain;Generalized weakness   PT Problem List Decreased strength;Decreased knowledge of use of DME;Decreased activity tolerance;Decreased safety awareness;Pain;Decreased mobility  PT Treatment Interventions Balance training;DME instruction;Gait training;Neuromuscular re-education;Functional mobility training;Therapeutic activities;Therapeutic exercise;Patient/family education   PT Goals (Current goals can be found in the Care Plan section) Acute Rehab PT Goals Patient Stated Goal: walking with straight posture PT Goal Formulation: With patient/family Time For Goal Achievement: 02/03/14 Potential to Achieve Goals: Good    Frequency Min 3X/week    End of Session Equipment Utilized During Treatment: Gait belt Activity Tolerance: Patient limited by pain Patient left: in chair;with family/visitor present           Time: [id number] PT Time Calculation (min): 34 min   Charges:   PT Evaluation $Initial PT Evaluation Tier I: 1 Procedure      Carianna Lague 01/20/2014, 12:32 PM

## 2014-01-20 NOTE — Progress Notes (Addendum)
[facility] PROGRESS NOTE  [name] [name] PPI:[id number] DOB: Apr 16, 1976 DOA: 01/18/2014 PCP: No primary provider on file.  Off Service Summary: 38yo with a hx of IV drug abuse who presents to the hospital after being found to have 2/2 staph aureus bacteremia. ID is following. Currently on ancef per ID. TTE was unremarkable. TEE for 8/11.  Assessment/Plan: 1. Staph bacteremia  1. Initially on empiric vancomycin 2. Appreciate ID input 3. ID recs for transition to Ancef 4. Repeat blood cx pending (antibiotics were already initiated prior to repeat blood cultures) 5. With recent fevers and admitted IV drug use, endocarditis is a strong concern 6. 2d echo done with no evidence of vegetations 7. Consulted Cardiology for TEE 2. Back pain  1. No signs of paraspinal infection on recent lumbar MRI 2. Disc protrusion noted on MRI instead 3. Per sig other at bedside, the patient engaged in vigorous sexual activity roughly five hours prior to onset of acute back pain 4. Sx improved with heat, per pt 5. Cont with analgesics and will try muscle relaxant 3. Tobacco abuse  1. Cessation done at bedside 2. Pt declines nicotine patch at this time 4. Heroin abuse  1. Addressed at bedside 2. Pt knows to avoid in future 5. DVT prophylaxis  1. Heparin subQ 6. Constipation 1. Ordered daily miralax  2. Cont with cathartics as needed 3. Large multiple BM noted overnight  Code Status: Full Family Communication: Pt in room Disposition Plan: Pending   Consultants:  Cardiology  ID  Procedures:  TTE 8/9  TEE pending  Antibiotics:  Vancomycin 8/8>>>8/9  Ancef 8/9>>>  HPI/Subjective: Large BM overnight with cathartics and enemas. No other issues overnight  Objective: Filed Vitals:   01/19/14 0542 01/19/14 1414 01/19/14 2122 01/20/14 0510  BP: 135/96 136/83 132/93 126/75  Pulse: 91 91 101 81  Temp: 99.5 F (37.5 C) 99.8 F (37.7 C) 99.1 F (37.3 C) 98.6 F (37 C)  TempSrc:  Oral Oral Oral Oral  Resp: 16 16 18 16   Height:      Weight:      SpO2: 99% 98% 98% 98%    Intake/Output Summary (Last 24 hours) at 01/20/14 0907 Last data filed at 01/20/14 0500  Gross per 24 hour  Intake    150 ml  Output   2625 ml  Net  -2475 ml   Filed Weights   01/18/14 1331 01/18/14 1551  Weight: 81.647 kg (180 lb) 77.792 kg (171 lb 8 oz)    Exam:   General:  Awake, in nad  Cardiovascular: regular, s1, s2  Respiratory: normal resp effort, no wheezing  Abdomen: soft, nondistended  Musculoskeletal: perfused, no clubbing   Data Reviewed: Basic Metabolic Panel:  Recent Labs Lab 01/16/14 1341 01/18/14 1345 01/19/14 0545  NA 137  --  139  K 4.2  --  3.5*  CL 99  --  100  CO2 27  --  25  GLUCOSE 181*  --  108*  BUN 6  --  7  CREATININE 0.84 0.73 0.65  CALCIUM 9.4  --  9.2   Liver Function Tests:  Recent Labs Lab 01/19/14 0545  AST 15  ALT 13  ALKPHOS 142*  BILITOT 0.8  PROT 7.0  ALBUMIN 3.5   No results found for this basename: LIPASE, AMYLASE,  in the last 168 hours No results found for this basename: AMMONIA,  in the last 168 hours CBC:  Recent Labs Lab 01/16/14 1341 01/18/14 1345 01/19/14 0545 01/20/14 0630  WBC 9.6 8.6 7.6 7.1  NEUTROABS 7.6  --   --   --   HGB 14.7 14.7 12.7* 12.9*  HCT 43.2 43.0 38.0* 38.8*  MCV 98.2 96.8 96.7 97.0  PLT 199 281 274 258   Cardiac Enzymes: No results found for this basename: CKTOTAL, CKMB, CKMBINDEX, TROPONINI,  in the last 168 hours BNP (last 3 results) No results found for this basename: PROBNP,  in the last 8760 hours CBG: No results found for this basename: GLUCAP,  in the last 168 hours  Recent Results (from the past 240 hour(s))  CULTURE, BLOOD (ROUTINE X 2)     Status: None   Collection Time    01/16/14  5:33 PM      Result Value Ref Range Status   Specimen Description BLOOD RIGHT ARM   Final   Special Requests BOTTLES DRAWN AEROBIC AND ANAEROBIC [facility]   Final   Culture  Setup Time      Final   Value: 01/17/2014 20:08     Performed at [facility]   Culture     Final   Value: STAPHYLOCOCCUS AUREUS     Note: RIFAMPIN AND GENTAMICIN SHOULD NOT BE USED AS SINGLE DRUGS FOR TREATMENT OF STAPH INFECTIONS. This organism is presumed to be Clindamycin resistant based on detection of inducible Clindamycin resistance.     Note: Gram Stain Report Called to,Read Back By and Verified With: L MILLER AT 1410 ON 01/17/14 BY S VANHOORNE     Performed at [facility]   Report Status 01/19/2014 FINAL   Final   Organism ID, Bacteria STAPHYLOCOCCUS AUREUS   Final  CULTURE, BLOOD (ROUTINE X 2)     Status: None   Collection Time    01/16/14  5:38 PM      Result Value Ref Range Status   Specimen Description BLOOD LEFT ARM   Final   Special Requests BOTTLES DRAWN AEROBIC AND ANAEROBIC [facility]   Final   Culture  Setup Time     Final   Value: 01/17/2014 15:17     Performed at [facility]   Culture     Final   Value: STAPHYLOCOCCUS AUREUS     Note: SUSCEPTIBILITIES PERFORMED ON PREVIOUS CULTURE WITHIN THE LAST 5 DAYS.     Note: CRITICAL RESULT CALLED TO, READ BACK BY AND VERIFIED WITH: SHANNON GAMMONS 01/18/14 @ 9:48AM BY RUSCOE A.     Performed at [facility]   Report Status 01/19/2014 FINAL   Final  CULTURE, BLOOD (ROUTINE X 2)     Status: None   Collection Time    01/19/14  5:51 PM      Result Value Ref Range Status   Specimen Description Blood LEFT ANTECUBITAL   Final   Special Requests BOTTLES DRAWN AEROBIC AND ANAEROBIC 7CC   Final   Culture PENDING   Incomplete   Report Status PENDING   Incomplete  CULTURE, BLOOD (ROUTINE X 2)     Status: None   Collection Time    01/19/14  5:59 PM      Result Value Ref Range Status   Specimen Description Blood BLOOD LEFT HAND   Final   Special Requests BOTTLES DRAWN AEROBIC AND ANAEROBIC 6CC   Final   Culture PENDING   Incomplete   Report Status PENDING   Incomplete     Studies: No results found.  Scheduled  Meds: . ALPRAZolam  1 mg Oral TID  . aspirin EC  81  mg Oral Daily  .  ceFAZolin (ANCEF) IV  2 g Intravenous Q8H  . heparin  5,000 Units Subcutaneous 3 times per day  . metaxalone  400 mg Oral TID  . polyethylene glycol  17 g Oral Daily  . sorbitol, milk of mag, mineral oil, glycerin (SMOG) enema  960 mL Rectal Once  . tamsulosin  0.4 mg Oral Daily   Continuous Infusions:   Principal Problem:   Bacteremia Active Problems:   Tobacco abuse   Fever   Staphylococcus aureus bacteremia  Time spent: 35min  CHIU, STEPHEN K  [facility] Pager [phone]. If 7PM-7AM, please contact night-coverage at [email or web address], password [email or web address] 01/20/2014, 9:07 AM  LOS: 2 days

## 2014-01-20 NOTE — Progress Notes (Addendum)
Patient ID: [name] [name], male   DOB: 05-28-1976, 38 y.o.   MRN: [id number]   Request for TEE received. Patient is 38 yo male with history of IV drug abuse admitted with fevers and bacteremia, cultures + for staph aureus. TTE with normal LVEF 60-65%, no significant valvular pathology noted. With staph bacteremia in IV drug user high risk of endocarditis that may not be visualized with TTE, TEE with high sensitivity. Will discuss with endoscopy if times available for today, if not plan for tomorrow. Keep NPO for now.   Dina RichJonathan Clarity Ciszek MD   Addendum 1007 AM  Patient history of opiate abuse, will need anesthesia to help with his sedation for procedure. Will find out their availability possibly today or tomorrow.   Dina RichJonathan Chlora Mcbain MD   Addendum [phone] Anesthesia availability is 1030AM tomorrow, plan for procedure at that time. Ok to resume diet today from our standpoint, make NPO at midnight.    Dina RichJonathan Aara Jacquot MD

## 2014-01-20 NOTE — Progress Notes (Signed)
    [facility]       [place] [facility] Staphylococcus aureus bacteremia   Staphylococcus aureus bacteremia (SAB) is associated with a high rate of complications and mortality.  Specific aspects of clinical management are critical to optimizing the outcome of patients with SAB.  Therefore, the [facility] Team [facility]) has initiated an intervention aimed at improving the management of SAB at [facility].  To do so, Infectious Diseases physicians are providing an evidence-based consult for the management of all patients with SAB.     Yes No Comments  Perform follow-up blood cultures (even if the patient is afebrile) to ensure clearance of bacteremia [x]  []    Remove vascular catheter and obtain follow-up blood cultures after the removal of the catheter []  []  DO NOT PLACE PICC AND NOT SURE GOOD IDEA REGARDLESS LONGTERM GIVEN IVDU HX    Perform echocardiography to evaluate for endocarditis (transthoracic ECHO is 40-50% sensitive, TEE is > 90% sensitive) []  []  Please keep in mind, that neither test can definitively EXCLUDE endocarditis, and that should clinical suspicion remain high for endocarditis the patient should then still be treated with an "endocarditis" duration of therapy = 6 weeks  TEE pending:  Consult electrophysiologist to evaluate implanted cardiac device (pacemaker, ICD) []  []    Ensure source control []  []  Have all abscesses been drained effectively? Have deep seeded infections (septic joints or osteomyelitis) had appropriate surgical debridement?  Investigate for "metastatic" sites of infection []  []  Does the patient have ANY symptom or physical exam finding that would suggest a deeper infection (back or neck pain that may be suggestive of vertebral osteomyelitis or epidural abscess, muscle pain that could be a symptom of pyomyositis)?  Keep in mind that for deep seeded infections MRI imaging with contrast is  preferred rather than other often insensitive tests such as plain x-rays, especially early in a patient's presentation. MRI SPINE WAS WITHOUT DISKITIS    Change antibiotic therapy to ANCEF 2 GRAMS IV Q 8 HOURS   [x]  []  Beta-lactam antibiotics are preferred for MSSA due to higher cure rates.   If on Vancomycin, goal trough should be 15 - 20 mcg/mL  Estimated duration of IV antibiotic therapy:  4-6 WEEKS IN SNF. DEPENDING UPON TEE, MAY ALSO CONSIDER LONGER ACTING DRUG SUCH AS ORITOVANCIN THOUHT NOT STUDIED FOR ENDOCARDITIS OR BACTEREMIA   []  []  Consult case management for probably prolonged outpatient IV antibiotic therapy

## 2014-01-21 ENCOUNTER — Inpatient Hospital Stay (HOSPITAL_COMMUNITY): Payer: Self-pay | Admitting: Anesthesiology

## 2014-01-21 ENCOUNTER — Encounter (HOSPITAL_COMMUNITY)
Admission: EM | Disposition: A | Discharge status: Home Health | Payer: Self-pay | Source: Home / Self Care | Attending: Internal Medicine

## 2014-01-21 ENCOUNTER — Inpatient Hospital Stay (HOSPITAL_COMMUNITY): Payer: Self-pay

## 2014-01-21 ENCOUNTER — Encounter (HOSPITAL_COMMUNITY): Payer: Self-pay | Admitting: *Deleted

## 2014-01-21 ENCOUNTER — Encounter (HOSPITAL_COMMUNITY): Payer: Self-pay | Admitting: Anesthesiology

## 2014-01-21 DIAGNOSIS — R7881 Bacteremia: Secondary | ICD-10-CM

## 2014-01-21 HISTORY — PX: TEE WITHOUT CARDIOVERSION: SHX5443

## 2014-01-21 LAB — CBC
HCT: 37.3 % — ABNORMAL LOW (ref 39.0–52.0)
HEMOGLOBIN: 12.4 g/dL — AB (ref 13.0–17.0)
MCH: 32 pg (ref 26.0–34.0)
MCHC: 33.2 g/dL (ref 30.0–36.0)
MCV: 96.4 fL (ref 78.0–100.0)
Platelets: 287 10*3/uL (ref 150–400)
RBC: 3.87 MIL/uL — ABNORMAL LOW (ref 4.22–5.81)
RDW: 12.9 % (ref 11.5–15.5)
WBC: 6.6 10*3/uL (ref 4.0–10.5)

## 2014-01-21 LAB — COMPREHENSIVE METABOLIC PANEL
ALT: 9 U/L (ref 0–53)
ANION GAP: 13 (ref 5–15)
AST: 19 U/L (ref 0–37)
Albumin: 3.3 g/dL — ABNORMAL LOW (ref 3.5–5.2)
Alkaline Phosphatase: 133 U/L — ABNORMAL HIGH (ref 39–117)
BILIRUBIN TOTAL: 0.4 mg/dL (ref 0.3–1.2)
BUN: 11 mg/dL (ref 6–23)
CHLORIDE: 103 meq/L (ref 96–112)
CO2: 26 mEq/L (ref 19–32)
Calcium: 9.3 mg/dL (ref 8.4–10.5)
Creatinine, Ser: 0.71 mg/dL (ref 0.50–1.35)
GFR calc non Af Amer: >90 mL/min (ref >90)
GLUCOSE: 116 mg/dL — AB (ref 70–99)
POTASSIUM: 4 meq/L (ref 3.7–5.3)
Sodium: 142 mEq/L (ref 137–147)
Total Protein: 7.2 g/dL (ref 6.0–8.3)

## 2014-01-21 SURGERY — ECHOCARDIOGRAM, TRANSESOPHAGEAL
Anesthesia: Monitor Anesthesia Care

## 2014-01-21 MED ORDER — SODIUM CHLORIDE 0.9 % IJ SOLN
INTRAMUSCULAR | Status: AC
Start: 2014-01-21 — End: 2014-01-21
  Filled 2014-01-21: qty 10

## 2014-01-21 MED ORDER — PROPOFOL INFUSION 10 MG/ML OPTIME
INTRAVENOUS | Status: DC | PRN
  Administered 2014-01-21: 11:00:00 via INTRAVENOUS
  Administered 2014-01-21: 125 ug/kg/min via INTRAVENOUS

## 2014-01-21 MED ORDER — MIDAZOLAM HCL 2 MG/2ML IJ SOLN
INTRAMUSCULAR | Status: AC
  Filled 2014-01-21: qty 2

## 2014-01-21 MED ORDER — OXYCODONE-ACETAMINOPHEN 5-325 MG PO TABS: 1.0000 | ORAL_TABLET | Freq: Four times a day (QID) | ORAL | Status: DC | PRN

## 2014-01-21 MED ORDER — SUCCINYLCHOLINE CHLORIDE 20 MG/ML IJ SOLN
INTRAMUSCULAR | Status: AC
  Filled 2014-01-21: qty 1

## 2014-01-21 MED ORDER — MIDAZOLAM HCL 2 MG/2ML IJ SOLN
1.0000 mg | INTRAMUSCULAR | Status: DC | PRN
  Administered 2014-01-21 (×2): 2 mg via INTRAVENOUS
  Filled 2014-01-21 (×2): qty 2

## 2014-01-21 MED ORDER — ONDANSETRON HCL 4 MG/2ML IJ SOLN: 4.0000 mg | Freq: Once | INTRAMUSCULAR | Status: DC | PRN

## 2014-01-21 MED ORDER — PROPOFOL 10 MG/ML IV BOLUS
INTRAVENOUS | Status: AC
  Filled 2014-01-21: qty 20

## 2014-01-21 MED ORDER — FENTANYL CITRATE 0.05 MG/ML IJ SOLN
25.0000 ug | INTRAMUSCULAR | Status: AC
  Administered 2014-01-21 (×2): 25 ug via INTRAVENOUS
  Filled 2014-01-21: qty 2

## 2014-01-21 MED ORDER — SODIUM CHLORIDE 0.9 % IV SOLN
INTRAVENOUS | Status: DC
  Administered 2014-01-21: 05:00:00 via INTRAVENOUS

## 2014-01-21 MED ORDER — FENTANYL CITRATE 0.05 MG/ML IJ SOLN
INTRAMUSCULAR | Status: DC | PRN
Start: 2014-01-21 — End: 2014-01-21
  Administered 2014-01-21 (×4): 50 ug via INTRAVENOUS

## 2014-01-21 MED ORDER — FENTANYL CITRATE 0.05 MG/ML IJ SOLN
INTRAMUSCULAR | Status: AC
  Filled 2014-01-21: qty 2

## 2014-01-21 MED ORDER — SODIUM CHLORIDE BACTERIOSTATIC 0.9 % IJ SOLN
INTRAMUSCULAR | Status: AC
  Filled 2014-01-21: qty 20

## 2014-01-21 MED ORDER — LACTATED RINGERS IV SOLN
INTRAVENOUS | Status: DC
  Administered 2014-01-21: 10:00:00 via INTRAVENOUS

## 2014-01-21 MED ORDER — LACTATED RINGERS IV SOLN
INTRAVENOUS | Status: DC | PRN
  Administered 2014-01-21: 10:00:00 via INTRAVENOUS

## 2014-01-21 MED ORDER — EPHEDRINE SULFATE 50 MG/ML IJ SOLN
INTRAMUSCULAR | Status: AC
  Filled 2014-01-21: qty 1

## 2014-01-21 MED ORDER — FENTANYL CITRATE 0.05 MG/ML IJ SOLN: 25.0000 ug | INTRAMUSCULAR | Status: DC | PRN

## 2014-01-21 MED ORDER — LIDOCAINE VISCOUS 2 % MT SOLN
15.0000 mL | Freq: Once | OROMUCOSAL | Status: AC
  Administered 2014-01-21: 5 mL via OROMUCOSAL

## 2014-01-21 MED ORDER — LINEZOLID 600 MG PO TABS: 600.0000 mg | ORAL_TABLET | Freq: Two times a day (BID) | ORAL | Status: DC

## 2014-01-21 MED ORDER — MIDAZOLAM HCL 5 MG/5ML IJ SOLN
INTRAMUSCULAR | Status: DC | PRN
  Administered 2014-01-21: 2 mg via INTRAVENOUS
  Administered 2014-01-21 (×2): 1 mg via INTRAVENOUS

## 2014-01-21 MED ORDER — LIDOCAINE HCL (PF) 1 % IJ SOLN
INTRAMUSCULAR | Status: AC
  Filled 2014-01-21: qty 5

## 2014-01-21 NOTE — H&P (Signed)
Procedure H&P  Admission H&P from admitting team reviewed as indicated below. 38 yo male hx of IV drug abuse admitted with fevers, found to have staph bacteremia. Plan for TEE today. Will ask anesthesia to help with sedation given his history of opiate abuse.    Dina Rich MD  Admission H&P  Chief Complaint: Back Pain  HPI: [name] [name] is a 38 y.o. male  Who presents to the ED for follow up for positive blood cultures. Pt initially presented to ED two days prior to admit with complaints of acute lower back pain and fevers. Pan cultures were obtained at that time. MRI of the lower back revealed a small L4-5 disc protrusion with no evidence of infection. Pt was discharged home. Blood cultures later returned pos for staph aureus, and the patient was asked to return to ED. In ED, pt was afebrile w/o leukocytosis. On further questioning, pt initially denied any illicit drug use, however pt eventually admitted to IV heroin, last use 10 days ago. Pt was started on empiric vanc and the hospitalist consuted for admission.  Review of Systems:  Per above, the remainder of the 10pt ros reviewed and are neg  Past Medical History   Diagnosis  Date   .  Hypertension    .  Anxiety    History reviewed. No pertinent past surgical history.  Social History: reports that he has been smoking. He does not have any smokeless tobacco history on file. He reports that he drinks alcohol. He reports that he does not use illicit drugs.  where does patient live--home, ALF, SNF? and with whom if at home?  Can patient participate in ADLs?  No Known Allergies  History reviewed. No pertinent family history. reviewed and is noncontributory to this particular case (be sure to complete)  Prior to Admission medications   Medication  Sig  Start Date  End Date  Taking?  Authorizing Provider   ALPRAZolam Prudy Feeler) 1 MG tablet  Take 1 mg by mouth 3 (three) times daily.    Yes  Historical Provider, MD   aspirin EC 81 MG  tablet  Take 81 mg by mouth daily.    Yes  Historical Provider, MD   ibuprofen (ADVIL,MOTRIN) 800 MG tablet  Take 1 tablet (800 mg total) by mouth 3 (three) times daily.  01/16/14   Yes  Kristen N Ward, DO   oxyCODONE-acetaminophen (PERCOCET/ROXICET) 5-325 MG per tablet  Take 1-2 tablets by mouth every 6 (six) hours as needed.  01/16/14   Yes  Layla Maw Ward, DO   Physical Exam:  Filed Vitals:    01/18/14 1331   BP:  141/80   Pulse:  86   Temp:  97.9 F (36.6 C)   TempSrc:  Oral   Height:  6' (1.829 m)   Weight:  81.647 kg (180 lb)   SpO2:  99%   General: Awake, in nad  Eyes: PERRL B  ENT: membranes moist, dentition poor, no oral lesions  Neck: supple, trachea midline  Cardiovascular: regular, no murmurs, s1, s2  Respiratory: normal resp effort, no wheezing  Abdomen: soft, nondistended  Skin: normal skin turgor, no abnormal skin lesions seen  Musculoskeletal: perfused, no clubbing  Psychiatric: mood/affect normal// no auditory/visual hallucinations  Neurologic: cn2-12 grossly intact, strength/sensation intact Labs on Admission:  Basic Metabolic Panel:  Recent Labs  Lab  01/16/14 1341  01/18/14 1345   NA  137  --   K  4.2  --   CL  99  --   CO2  27  --   GLUCOSE  181*  --   BUN  6  --   CREATININE  0.84  0.73   CALCIUM  9.4  --   Liver Function Tests:  No results found for this basename: AST, ALT, ALKPHOS, BILITOT, PROT, ALBUMIN, in the last 168 hours  No results found for this basename: LIPASE, AMYLASE, in the last 168 hours  No results found for this basename: AMMONIA, in the last 168 hours  CBC:  Recent Labs  Lab  01/16/14 1341  01/18/14 1345   WBC  9.6  8.6   NEUTROABS  7.6  --   HGB  14.7  14.7   HCT  43.2  43.0   MCV  98.2  96.8   PLT  199  281   Cardiac Enzymes:  No results found for this basename: CKTOTAL, CKMB, CKMBINDEX, TROPONINI, in the last 168 hours  BNP (last 3 results)  No results found for this basename: PROBNP, in the last 8760 hours  CBG:  No  results found for this basename: GLUCAP, in the last 168 hours  Radiological Exams on Admission:  Mr Lumbar Spine W Wo Contrast  01/16/2014 CLINICAL DATA: Fever, low back pain, and urinary retention. EXAM: MRI LUMBAR SPINE WITHOUT AND WITH CONTRAST TECHNIQUE: Multiplanar and multiecho pulse sequences of the lumbar spine were obtained without and with intravenous contrast. CONTRAST: 15mL MULTIHANCE GADOBENATE DIMEGLUMINE 529 MG/ML IV SOLN COMPARISON: None. FINDINGS: There is transitional lumbosacral anatomy. For this dictation, the lowest well-formed intervertebral disc space will be labeled L5-S1, with a right-sided assimilation joint present at this level. Vertebral alignment is normal. Vertebral body heights are preserved. Vertebral bone marrow signal is within normal limits. Disc desiccation and mild disc space narrowing are present at L4-5. Other intervertebral discs are well hydrated and maintained in height. The conus medullaris is normal in signal and terminates at the superior aspect of L1. No epidural abscess or other fluid collection is seen. Paraspinal soft tissues are unremarkable. L1-2 through L3-4: Negative. L4-5: Small left central disc protrusion results in narrowing of the left-greater-than-right lateral recesses and contacts the left L5 nerve root in the lateral recess. There is a small amount of enhancement at the left aspect of the herniated disc. No spinal canal or neural foraminal stenosis. Mild facet arthrosis. L5-S1: Negative. IMPRESSION: Small L4-5 disc protrusion, which contacts the left L5 nerve root in the lateral recess without evidence of significant mass effect. No evidence of infection in the lumbar spine. Electronically Signed By: Sebastian AcheAllen Grady On: 01/16/2014 16:14  Assessment/Plan  Principal Problem:  Bacteremia  Active Problems:  Tobacco abuse  Fever  Staphylococcus aureus bacteremia  1. Staph bacteremia  1. Will cont on empiric vancomycin 2. With recent fevers and  admitted IV drug use, endocarditis is a concern 3. Will obtain 2d echo, may ultimately require TTE 4. Admit to med-surg 2. Back pain  1. No signs of infection on recent MRI 2. Disc protrusion noted on MRI instead 3. Per sig other at bedside, the patient engaged in vigorous sexual activity roughly five hours prior to onset of acute back pain 4. Sx improved with heat, per pt 5. Will cont with analgesics and will try muscle relaxant 3. Tobacco abuse  1. Cessation done at bedside 2. Pt declines nicotine patch at this time 4. Heroin abuse  1. Addressed at bedside 2. Pt knows to avoid in future 5. DVT prophylaxis  1. Heparin subQ  Code Status: Full (must indicate code status--if unknown or must be presumed, indicate so)  Family Communication: Pt in room (indicate person spoken with, if applicable, with phone number if by telephone)  Disposition Plan: Pending (indicate anticipated LOS)  Time spent:  CHIU, STEPHEN K  [facility]  Pager [phone]  If 7PM-7AM, please contact night-coverage  [email or web address]  Password [email or web address]  01/18/2014, 2:42 PM

## 2014-01-21 NOTE — Transfer of Care (Signed)
Immediate Anesthesia Transfer of Care Note  Patient: [name] [name]  Procedure(s) Performed: Procedure(s): TRANSESOPHAGEAL ECHOCARDIOGRAM (TEE) with propofol (N/A)  Patient Location: PACU  Anesthesia Type:MAC  Level of Consciousness: awake, alert , oriented and patient cooperative  Airway & Oxygen Therapy: Patient Spontanous Breathing  Post-op Assessment: Report given to PACU RN and Post -op Vital signs reviewed and stable  Post vital signs: Reviewed and stable  Complications: No apparent anesthesia complications

## 2014-01-21 NOTE — Anesthesia Preprocedure Evaluation (Signed)
Anesthesia Evaluation  Patient identified by MRN, date of birth, ID band Patient awake    Reviewed: Allergy & Precautions, H&P , NPO status , Patient's Chart, lab work & pertinent test results  Airway Mallampati: II TM Distance: >3 FB     Dental  (+) Poor Dentition, Chipped, Missing, Dental Advisory Given   Pulmonary pneumonia -, Current Smoker,  breath sounds clear to auscultation        Cardiovascular hypertension, Rhythm:Regular Rate:Normal     Neuro/Psych PSYCHIATRIC DISORDERS Anxiety    GI/Hepatic (+)     substance abuse  IV drug use,   Endo/Other    Renal/GU      Musculoskeletal   Abdominal   Peds  Hematology   Anesthesia Other Findings Bacteremia probably from IV drug use.  Reproductive/Obstetrics                           Anesthesia Physical Anesthesia Plan  ASA: III  Anesthesia Plan: MAC   Post-op Pain Management:    Induction: Intravenous  Airway Management Planned: Simple Face Mask  Additional Equipment:   Intra-op Plan:   Post-operative Plan:   Informed Consent: I have reviewed the patients History and Physical, chart, labs and discussed the procedure including the risks, benefits and alternatives for the proposed anesthesia with the patient or authorized representative who has indicated his/her understanding and acceptance.     Plan Discussed with:   Anesthesia Plan Comments:         Anesthesia Quick Evaluation

## 2014-01-21 NOTE — Procedures (Signed)
Procedure: TEE Indication: Bacteremia, evaluate for endocarditis Performing physician: Dr Dina RichJonathan Keyara Ent   Patient was brought to the procedure suite after appropriate consent was obtained. The oropharynx was anesthesized with viscious 2% lidocaine and cetacaine spray. Bite block put in place. Sedation was managed by anesthesiology, please refer to there procedure note. Once adequate sedation was achieved, the TEE probe was intubated into the esophagus without difficulty. The patient tolerated the procedure without complications.   Conclusions Please see full TEE report for specifics. Overall he has normal valves with no evidence of dysfunction or endocarditis. Normal LV systolic function.    Dina RichJonathan Damareon Lanni MD

## 2014-01-21 NOTE — Addendum Note (Signed)
Addendum created 01/21/14 1432 by Earleen NewportAmy A Hilman Kissling, CRNA   Modules edited: Charges VN

## 2014-01-21 NOTE — Progress Notes (Signed)
[facility] PROGRESS NOTE  [name] [name] ZOX:[id number] DOB: 1975-08-28 DOA: 01/18/2014 PCP: No primary provider on file.  Off Service Summary: 38yo with a hx of IV drug abuse who presents to the hospital after being found to have 2/2 staph aureus bacteremia. ID is following. Currently on ancef per ID. TTE was unremarkable. TEE for 8/11.  Assessment/Plan: 1. Staph bacteremia  1. On admission, pt was initially started on empiric vancomycin 2. Appreciate ID input 3. ID recs for transition to Ancef 4. Repeat blood cx pending (antibiotics were already initiated prior to repeat blood cultures) 5. With recent fevers and admitted IV drug use, endocarditis is a strong concern 6. 2d echo done with no evidence of vegetations 7. TEE on 8/11 with prelim results of no vegetations or evidence of endocarditis 2. Back pain  1. No signs of paraspinal infection on recent lumbar MRI 2. Disc protrusion noted on MRI instead 3. Per sig other at bedside, the patient engaged in vigorous sexual activity roughly five hours prior to onset of acute back pain 4. Sx improved with heat, per pt 5. Cont with analgesics and will try muscle relaxant 3. Tobacco abuse  1. Cessation done at bedside 2. Pt declines nicotine patch at this time 4. Heroin abuse  1. Addressed at bedside 2. Pt knows to avoid in future 5. DVT prophylaxis  1. Heparin subQ 6. Constipation 1. Ordered daily miralax  2. Cont with cathartics as needed 3. Large multiple BM noted overnight  Code Status: Full Family Communication: Pt and family in room Disposition Plan: Pending   Consultants:  Cardiology  ID  Procedures:  TTE 8/9  TEE pending  Antibiotics:  Vancomycin 8/8>>>8/9  Ancef 8/9>>>  HPI/Subjective: Pt eager to go home. No acute events noted overnight  Objective: Filed Vitals:   01/20/14 0510 01/20/14 1438 01/20/14 2047 01/21/14 0643  BP: 126/75 133/84 123/75 119/78  Pulse: 81 101 94 91  Temp: 98.6 F (37  C) 98.4 F (36.9 C) 99 F (37.2 C) 99.1 F (37.3 C)  TempSrc: Oral Oral Oral Oral  Resp: 16 18 20 20   Height:      Weight:      SpO2: 98% 98% 97% 97%    Intake/Output Summary (Last 24 hours) at 01/21/14 0828 Last data filed at 01/21/14 0500  Gross per 24 hour  Intake     50 ml  Output   2900 ml  Net  -2850 ml   Filed Weights   01/18/14 1331 01/18/14 1551  Weight: 81.647 kg (180 lb) 77.792 kg (171 lb 8 oz)    Exam:   General:  Awake, in nad  Cardiovascular: regular, s1, s2  Respiratory: normal resp effort, no wheezing  Abdomen: soft, nondistended  Musculoskeletal: perfused, no clubbing   Data Reviewed: Basic Metabolic Panel:  Recent Labs Lab 01/16/14 1341 01/18/14 1345 01/19/14 0545 01/21/14 0538  NA 137  --  139 142  K 4.2  --  3.5* 4.0  CL 99  --  100 103  CO2 27  --  25 26  GLUCOSE 181*  --  108* 116*  BUN 6  --  7 11  CREATININE 0.84 0.73 0.65 0.71  CALCIUM 9.4  --  9.2 9.3   Liver Function Tests:  Recent Labs Lab 01/19/14 0545 01/21/14 0538  AST 15 19  ALT 13 9  ALKPHOS 142* 133*  BILITOT 0.8 0.4  PROT 7.0 7.2  ALBUMIN 3.5 3.3*   No results found for this basename:  LIPASE, AMYLASE,  in the last 168 hours No results found for this basename: AMMONIA,  in the last 168 hours CBC:  Recent Labs Lab 01/16/14 1341 01/18/14 1345 01/19/14 0545 01/20/14 0613 01/21/14 0538  WBC 9.6 8.6 7.6 7.1 6.6  NEUTROABS 7.6  --   --   --   --   HGB 14.7 14.7 12.7* 12.9* 12.4*  HCT 43.2 43.0 38.0* 38.8* 37.3*  MCV 98.2 96.8 96.7 97.0 96.4  PLT 199 281 274 258 287   Cardiac Enzymes: No results found for this basename: CKTOTAL, CKMB, CKMBINDEX, TROPONINI,  in the last 168 hours BNP (last 3 results) No results found for this basename: PROBNP,  in the last 8760 hours CBG: No results found for this basename: GLUCAP,  in the last 168 hours  Recent Results (from the past 240 hour(s))  CULTURE, BLOOD (ROUTINE X 2)     Status: None   Collection Time     01/16/14  5:33 PM      Result Value Ref Range Status   Specimen Description BLOOD RIGHT ARM   Final   Special Requests BOTTLES DRAWN AEROBIC AND ANAEROBIC [facility]   Final   Culture  Setup Time     Final   Value: 01/17/2014 20:08     Performed at [facility]   Culture     Final   Value: STAPHYLOCOCCUS AUREUS     Note: RIFAMPIN AND GENTAMICIN SHOULD NOT BE USED AS SINGLE DRUGS FOR TREATMENT OF STAPH INFECTIONS. This organism is presumed to be Clindamycin resistant based on detection of inducible Clindamycin resistance.     Note: Gram Stain Report Called to,Read Back By and Verified With: L MILLER AT 1410 ON 01/17/14 BY S VANHOORNE     Performed at [facility]   Report Status 01/19/2014 FINAL   Final   Organism ID, Bacteria STAPHYLOCOCCUS AUREUS   Final  CULTURE, BLOOD (ROUTINE X 2)     Status: None   Collection Time    01/16/14  5:38 PM      Result Value Ref Range Status   Specimen Description BLOOD LEFT ARM   Final   Special Requests BOTTLES DRAWN AEROBIC AND ANAEROBIC [facility]7CC   Final   Culture  Setup Time     Final   Value: 01/17/2014 15:17     Performed at [facility]   Culture     Final   Value: STAPHYLOCOCCUS AUREUS     Note: SUSCEPTIBILITIES PERFORMED ON PREVIOUS CULTURE WITHIN THE LAST 5 DAYS.     Note: CRITICAL RESULT CALLED TO, READ BACK BY AND VERIFIED WITH: SHANNON GAMMONS 01/18/14 @ 9:48AM BY RUSCOE A.     Performed at [facility]   Report Status 01/19/2014 FINAL   Final  CULTURE, BLOOD (ROUTINE X 2)     Status: None   Collection Time    01/19/14  5:51 PM      Result Value Ref Range Status   Specimen Description Blood LEFT ANTECUBITAL   Final   Special Requests BOTTLES DRAWN AEROBIC AND ANAEROBIC 7CC   Final   Culture NO GROWTH 1 DAY   Final   Report Status PENDING   Incomplete  CULTURE, BLOOD (ROUTINE X 2)     Status: None   Collection Time    01/19/14  5:59 PM      Result Value Ref Range Status   Specimen Description Blood BLOOD LEFT HAND    Final   Special Requests  BOTTLES DRAWN AEROBIC AND ANAEROBIC 6CC   Final   Culture NO GROWTH 1 DAY   Final   Report Status PENDING   Incomplete     Studies: No results found.  Scheduled Meds: . ALPRAZolam  1 mg Oral TID  . aspirin EC  81 mg Oral Daily  .  ceFAZolin (ANCEF) IV  2 g Intravenous Q8H  . heparin  5,000 Units Subcutaneous 3 times per day  . metaxalone  400 mg Oral TID  . polyethylene glycol  17 g Oral Daily  . sodium chloride bacteriostatic      . sorbitol, milk of mag, mineral oil, glycerin (SMOG) enema  960 mL Rectal Once  . tamsulosin  0.4 mg Oral Daily   Continuous Infusions: . sodium chloride 20 mL/hr at 01/21/14 4098    Principal Problem:   Bacteremia Active Problems:   Tobacco abuse   Fever   Staphylococcus aureus bacteremia  Time spent:  CHIU, STEPHEN K  [facility] Pager [phone]. If 7PM-7AM, please contact night-coverage at [email or web address], password [facility] 01/21/2014, 8:28 AM  LOS: 3 days

## 2014-01-21 NOTE — Discharge Summary (Signed)
Physician Discharge Summary  [name] [name] ZOX:[id number] DOB: 14-Jan-1976 DOA: 01/18/2014  PCP: No primary provider on file.  Admit date: 01/18/2014 Discharge date: 01/21/2014  Time spent: 35 minutes  Recommendations for Outpatient Follow-up:  1. Follow up with PCP in 1-2 weeks 2. Follow up with Urology as originally planned through ED  Discharge Diagnoses:  Principal Problem:   Bacteremia Active Problems:   Tobacco abuse   Fever   Staphylococcus aureus bacteremia   Discharge Condition: Stable  Diet recommendation: Regular  Filed Weights   01/18/14 1331 01/18/14 1551  Weight: 81.647 kg (180 lb) 77.792 kg (171 lb 8 oz)    History of present illness:  See admit h and p from 8/8 for details. Briefly, pt is a 38yo IV drug abuser who presents to the ED with staph bacteremia. Pt was admitted for further work up.  Hospital Course:  Staph bacteremia  1. On admission, pt was initially started on empiric vancomycin 2. Appreciated ID input 3. ID recs for transition to Ancef 4. Repeat blood cx obtained 8/9 were neg for growth thus far 5. 2d echo done with no evidence of vegetations 6. TEE on 8/11 with no vegetations or evidence of endocarditis 7. Discussed case with ID. Per ID, recs for 2 weeks of zyvox starting after neg blood cx (8/9) Back pain  1. No signs of paraspinal infection on recent lumbar MRI 2. Disc protrusion noted on MRI instead 3. Per sig other at bedside, the patient engaged in vigorous sexual activity roughly five hours prior to onset of acute back pain 4. Sx improved with heat, per pt 5. Cont with analgesics 6. PT with recs for home health PT Tobacco abuse  1. Cessation done at bedside 2. Pt declined nicotine patch Heroin abuse  1. Addressed at bedside 2. Pt knows to avoid in future DVT prophylaxis  1. Heparin subQ Constipation  1. Ordered daily miralax  2. Large BM noted with cathartics and enemas Bladder Outlet Obstruction       1.   This was  addressed at recent ED visit       2.    Pt is already scheduled to follow up with Urology as an outpatient       3.    Will be discharged with foley cath for now until seen by Urology as outpt   Procedures:  TTE  TEE  Consultations:  Cardiology  ID  Discharge Exam: Filed Vitals:   01/21/14 0947 01/21/14 1030 01/21/14 1130 01/21/14 1145  BP: 143/107 145/86 154/88 142/92  Pulse: 89 95 86 93  Temp: 99.2 F (37.3 C)  98 F (36.7 C)   TempSrc: Oral     Resp: 14 16 18 14   Height:      Weight:      SpO2: 96% 100% 100% 97%    General: Awake, in nad Cardiovascular: regular, s1, s2 Respiratory: normal resp effort, no wheezing  Discharge Instructions     Medication List         ALPRAZolam 1 MG tablet  Commonly known as:  XANAX  Take 1 mg by mouth 3 (three) times daily.     aspirin EC 81 MG tablet  Take 81 mg by mouth daily.     ibuprofen 800 MG tablet  Commonly known as:  ADVIL,MOTRIN  Take 1 tablet (800 mg total) by mouth 3 (three) times daily.     linezolid 600 MG tablet  Commonly known as:  ZYVOX  Take 1 tablet (  600 mg total) by mouth 2 (two) times daily.     oxyCODONE-acetaminophen 5-325 MG per tablet  Commonly known as:  PERCOCET/ROXICET  Take 1-2 tablets by mouth every 6 (six) hours as needed.       No Known Allergies Follow-up Information   Follow up with Follow up with Urology as originally planned.       The results of significant diagnostics from this hospitalization (including imaging, microbiology, ancillary and laboratory) are listed below for reference.    Significant Diagnostic Studies: Mr Lumbar Spine W Wo Contrast  01/16/2014   CLINICAL DATA:  Fever, low back pain, and urinary retention.  EXAM: MRI LUMBAR SPINE WITHOUT AND WITH CONTRAST  TECHNIQUE: Multiplanar and multiecho pulse sequences of the lumbar spine were obtained without and with intravenous contrast.  CONTRAST:  15mL MULTIHANCE GADOBENATE DIMEGLUMINE 529 MG/ML IV SOLN   COMPARISON:  None.  FINDINGS: There is transitional lumbosacral anatomy. For this dictation, the lowest well-formed intervertebral disc space will be labeled L5-S1, with a right-sided assimilation joint present at this level.  Vertebral alignment is normal. Vertebral body heights are preserved. Vertebral bone marrow signal is within normal limits. Disc desiccation and mild disc space narrowing are present at L4-5. Other intervertebral discs are well hydrated and maintained in height. The conus medullaris is normal in signal and terminates at the superior aspect of L1. No epidural abscess or other fluid collection is seen. Paraspinal soft tissues are unremarkable.  L1-2 through L3-4:  Negative.  L4-5: Small left central disc protrusion results in narrowing of the left-greater-than-right lateral recesses and contacts the left L5 nerve root in the lateral recess. There is a small amount of enhancement at the left aspect of the herniated disc. No spinal canal or neural foraminal stenosis. Mild facet arthrosis.  L5-S1:  Negative.  IMPRESSION: Small L4-5 disc protrusion, which contacts the left L5 nerve root in the lateral recess without evidence of significant mass effect. No evidence of infection in the lumbar spine.   Electronically Signed   By: Sebastian Ache   On: 01/16/2014 16:14    Microbiology: Recent Results (from the past 240 hour(s))  CULTURE, BLOOD (ROUTINE X 2)     Status: None   Collection Time    01/16/14  5:33 PM      Result Value Ref Range Status   Specimen Description BLOOD RIGHT ARM   Final   Special Requests BOTTLES DRAWN AEROBIC AND ANAEROBIC [facility]   Final   Culture  Setup Time     Final   Value: 01/17/2014 20:08     Performed at [facility]   Culture     Final   Value: STAPHYLOCOCCUS AUREUS     Note: RIFAMPIN AND GENTAMICIN SHOULD NOT BE USED AS SINGLE DRUGS FOR TREATMENT OF STAPH INFECTIONS. This organism is presumed to be Clindamycin resistant based on detection of inducible  Clindamycin resistance.     Note: Gram Stain Report Called to,Read Back By and Verified With: L MILLER AT 1410 ON 01/17/14 BY S VANHOORNE     Performed at [facility]   Report Status 01/19/2014 FINAL   Final   Organism ID, Bacteria STAPHYLOCOCCUS AUREUS   Final  CULTURE, BLOOD (ROUTINE X 2)     Status: None   Collection Time    01/16/14  5:38 PM      Result Value Ref Range Status   Specimen Description BLOOD LEFT ARM   Final   Special Requests BOTTLES DRAWN AEROBIC  AND ANAEROBIC [facility]   Final   Culture  Setup Time     Final   Value: 01/17/2014 15:17     Performed at [facility]   Culture     Final   Value: STAPHYLOCOCCUS AUREUS     Note: SUSCEPTIBILITIES PERFORMED ON PREVIOUS CULTURE WITHIN THE LAST 5 DAYS.     Note: CRITICAL RESULT CALLED TO, READ BACK BY AND VERIFIED WITH: SHANNON GAMMONS 01/18/14 @ 9:48AM BY RUSCOE A.     Performed at [facility]   Report Status 01/19/2014 FINAL   Final  CULTURE, BLOOD (ROUTINE X 2)     Status: None   Collection Time    01/19/14  5:51 PM      Result Value Ref Range Status   Specimen Description BLOOD LEFT ANTECUBITAL   Final   Special Requests BOTTLES DRAWN AEROBIC AND ANAEROBIC 7CC   Final   Culture NO GROWTH 2 DAYS   Final   Report Status PENDING   Incomplete  CULTURE, BLOOD (ROUTINE X 2)     Status: None   Collection Time    01/19/14  5:59 PM      Result Value Ref Range Status   Specimen Description BLOOD LEFT HAND   Final   Special Requests BOTTLES DRAWN AEROBIC AND ANAEROBIC 6CC   Final   Culture NO GROWTH 2 DAYS   Final   Report Status PENDING   Incomplete     Labs: Basic Metabolic Panel:  Recent Labs Lab 01/16/14 1341 01/18/14 1345 01/19/14 0545 01/21/14 0538  NA 137  --  139 142  K 4.2  --  3.5* 4.0  CL 99  --  100 103  CO2 27  --  25 26  GLUCOSE 181*  --  108* 116*  BUN 6  --  7 11  CREATININE 0.84 0.73 0.65 0.71  CALCIUM 9.4  --  9.2 9.3   Liver Function Tests:  Recent Labs Lab  01/19/14 0545 01/21/14 0538  AST 15 19  ALT 13 9  ALKPHOS 142* 133*  BILITOT 0.8 0.4  PROT 7.0 7.2  ALBUMIN 3.5 3.3*   No results found for this basename: LIPASE, AMYLASE,  in the last 168 hours No results found for this basename: AMMONIA,  in the last 168 hours CBC:  Recent Labs Lab 01/16/14 1341 01/18/14 1345 01/19/14 0545 01/20/14 0613 01/21/14 0538  WBC 9.6 8.6 7.6 7.1 6.6  NEUTROABS 7.6  --   --   --   --   HGB 14.7 14.7 12.7* 12.9* 12.4*  HCT 43.2 43.0 38.0* 38.8* 37.3*  MCV 98.2 96.8 96.7 97.0 96.4  PLT 199 281 274 258 287   Cardiac Enzymes: No results found for this basename: CKTOTAL, CKMB, CKMBINDEX, TROPONINI,  in the last 168 hours BNP: BNP (last 3 results) No results found for this basename: PROBNP,  in the last 8760 hours CBG: No results found for this basename: GLUCAP,  in the last 168 hours  Signed:  CHIU, STEPHEN K  [facility] 01/21/2014, 2:57 PM

## 2014-01-21 NOTE — Anesthesia Postprocedure Evaluation (Signed)
  Anesthesia Post-op Note  Patient: Bernerd LimboDoyle T Masi  Procedure(s) Performed: Procedure(s): TRANSESOPHAGEAL ECHOCARDIOGRAM (TEE) with propofol (N/A)  Patient Location: PACU  Anesthesia Type:MAC  Level of Consciousness: awake, alert , oriented and patient cooperative  Airway and Oxygen Therapy: Patient Spontanous Breathing and Patient connected to face mask oxygen  Post-op Pain: none  Post-op Assessment: Post-op Vital signs reviewed, Patient's Cardiovascular Status Stable, Respiratory Function Stable, Patent Airway, No signs of Nausea or vomiting and Pain level controlled  Post-op Vital Signs: Reviewed and stable  Last Vitals:  Filed Vitals:   01/21/14 1030  BP: 145/86  Pulse: 95  Temp:   Resp: 16    Complications: No apparent anesthesia complications

## 2014-01-21 NOTE — Progress Notes (Signed)
  Echocardiogram Echocardiogram Transesophageal has been performed.  Manroop Jakubowicz 01/21/2014, 11:43 AM

## 2014-01-21 NOTE — Progress Notes (Signed)
Patient's IV was removed and was clean, dry, and intact at removal.  Patient's urinary catheter remained in place per order and standard drainage bag was replaced with a leg bag.  Patient was instructed on how to empty and clean bag.  Patient received discharge instructions and was able to teach back about importance for follow-up appointment and medications.  Patient was escorted to Emergency Room waiting area, via wheel chair by nurse, where he met his family member.  Patient requested to be taken to Emergency room to wait on his family.

## 2014-01-21 NOTE — Progress Notes (Signed)
    [facility]       [place] [facility] Management Team Staphylococcus aureus bacteremia   Staphylococcus aureus bacteremia (SAB) is associated with a high rate of complications and mortality.  Specific aspects of clinical management are critical to optimizing the outcome of patients with SAB.  Therefore, the [facility] Management Team [facility]) has initiated an intervention aimed at improving the management of SAB at [facility].  To do so, Infectious Diseases physicians are providing an evidence-based consult for the management of all patients with SAB.     Yes No Comments  Perform follow-up blood cultures (even if the patient is afebrile) to ensure clearance of bacteremia [x]  []  BLOOD CULTURES NO GROWTH TO DATE FROM 01/19/14  Remove vascular catheter and obtain follow-up blood cultures after the removal of the catheter []  []  NA  Perform echocardiography to evaluate for endocarditis (transthoracic ECHO is 40-50% sensitive, TEE is > 90% sensitive) [x]  []  Please keep in mind, that neither test can definitively EXCLUDE endocarditis, and that should clinical suspicion remain high for endocarditis the patient should then still be treated with an "endocarditis" duration of therapy = 6 weeks  TEE CLEAN GREAT THANKS TO CARDIOLOGY  Consult electrophysiologist to evaluate implanted cardiac device (pacemaker, ICD) []  []   NA  Ensure source control [x]  []  Have all abscesses been drained effectively? Have deep seeded infections (septic joints or osteomyelitis) had appropriate surgical debridement?  Investigate for "metastatic" sites of infection []  []  Does the patient have ANY symptom or physical exam finding that would suggest a deeper infection (back or neck pain that may be suggestive of vertebral osteomyelitis or epidural abscess, muscle pain that could be a symptom of pyomyositis)?  Keep in mind that for deep seeded infections MRI imaging with contrast is  preferred rather than other often insensitive tests such as plain x-rays, especially early in a patient's presentation.  Change antibiotic therapy to ANCEF 2 GRAMS IV Q 8 BUT BECAUSE NOT A GOOD CANDIDATE FOR IV THERAPY AT HOME AND WANTS TO GO HOME INSTEAD OF TO SNF WITH IV ABX WOULD FINISH HIS COURSE WITH ORAL ZYVOX 600MG  Q 12 HOURS []  []  Beta-lactam antibiotics are preferred for MSSA due to higher cure rates.   If on Vancomycin, goal trough should be 15 - 20 mcg/mL  Estimated duration of IV antibiotic therapy:  NEEDS THERAPY THRU AUGUST 22ND [x]  []  Consult case management for probably prolonged outpatient IV antibiotic therapy

## 2014-01-22 ENCOUNTER — Observation Stay (HOSPITAL_COMMUNITY): Admission: AD | Admit: 2014-01-22 | Payer: MEDICAID | Source: Ambulatory Visit | Admitting: Internal Medicine

## 2014-01-22 ENCOUNTER — Encounter (HOSPITAL_COMMUNITY): Payer: Self-pay | Admitting: Cardiology

## 2014-01-22 NOTE — Progress Notes (Signed)
This patient was discharged from any hospital on 8/11 after being treated for MSSA bacteremia. I was*U. his chart it issues with his medications. The patient has a history of intravenous drug abuse with last use of intravenous heroin approximately 10 days prior to his admission per chart. He was found to have MSSA bacteremia. TEE was done which did not show any signs of vegetations. MRI of his lumbar spine was done which did not show any evidence of discitis. Plan was to treat for 2 weeks of antibiotics. It was not felt that he would be a good candidate for home intravenous antibiotics via PICC line his history of drug use. At that time, case was discussed with Dr. Algis LimingVanDam on call for infectious disease and recommendations were to discharge the patient on oral linezolid. The patient received a voucher to assist in obtaining his medications since he does not have any insurance. Due to the cost of linezolid, this would not be covered by medication voucher. I discussed the case with infectious disease on call, Dr. Luciana Axeomer, who recommended that the patient will be better served with a 2 week course of intravenous antibiotics. It was recommended that he would likely need readmission, placement of PICC line, and likely discharge to a skilled nursing facility to continue his antibiotic course. I did call the patient at home and explained to him the importance of treatment with intravenous antibiotics for his MSSA bacteremia. I also described the potential risks of under treating/not treating this infection. The patient reported that he would return to the hospital for direct admission and placement of PICC line. The patient did not come to the hospital for direct admission. Multiple attempts were made to contact the patient at home via phone by calling all available phone numbers on chart. I have left a voice for the patient asking him to call nursing unit. At this time, it does not appear that we are able to reach the  patient. We'll be available to treat the patient if he decides to come to the hospital.  [facility],[name] Pavao

## 2014-01-22 NOTE — Progress Notes (Signed)
Ur review complete.  

## 2014-01-23 ENCOUNTER — Encounter (HOSPITAL_COMMUNITY): Payer: Self-pay | Admitting: Emergency Medicine

## 2014-01-23 ENCOUNTER — Inpatient Hospital Stay (HOSPITAL_COMMUNITY)
Admission: EM | Admit: 2014-01-23 | Discharge: 2014-01-24 | DRG: 872 | Disposition: A | Discharge status: Home / Self Care | Payer: Self-pay | Attending: Internal Medicine | Admitting: Internal Medicine

## 2014-01-23 DIAGNOSIS — N320 Bladder-neck obstruction: Secondary | ICD-10-CM | POA: Diagnosis present

## 2014-01-23 DIAGNOSIS — A4901 Methicillin susceptible Staphylococcus aureus infection, unspecified site: Secondary | ICD-10-CM | POA: Diagnosis present

## 2014-01-23 DIAGNOSIS — R7881 Bacteremia: Principal | ICD-10-CM | POA: Diagnosis present

## 2014-01-23 DIAGNOSIS — F17200 Nicotine dependence, unspecified, uncomplicated: Secondary | ICD-10-CM | POA: Diagnosis present

## 2014-01-23 DIAGNOSIS — Z720 Tobacco use: Secondary | ICD-10-CM | POA: Diagnosis present

## 2014-01-23 DIAGNOSIS — B9561 Methicillin susceptible Staphylococcus aureus infection as the cause of diseases classified elsewhere: Secondary | ICD-10-CM | POA: Diagnosis present

## 2014-01-23 DIAGNOSIS — I10 Essential (primary) hypertension: Secondary | ICD-10-CM | POA: Diagnosis present

## 2014-01-23 LAB — COMPREHENSIVE METABOLIC PANEL
ALBUMIN: 3.8 g/dL (ref 3.5–5.2)
ALK PHOS: 110 U/L (ref 39–117)
ALT: 7 U/L (ref 0–53)
ANION GAP: 14 (ref 5–15)
AST: 13 U/L (ref 0–37)
BUN: 7 mg/dL (ref 6–23)
CALCIUM: 9.5 mg/dL (ref 8.4–10.5)
CO2: 28 mEq/L (ref 19–32)
CREATININE: 0.76 mg/dL (ref 0.50–1.35)
Chloride: 99 mEq/L (ref 96–112)
GFR calc non Af Amer: >90 mL/min (ref >90)
GLUCOSE: 91 mg/dL (ref 70–99)
POTASSIUM: 3.6 meq/L — AB (ref 3.7–5.3)
Sodium: 141 mEq/L (ref 137–147)
Total Bilirubin: 0.5 mg/dL (ref 0.3–1.2)
Total Protein: 7.8 g/dL (ref 6.0–8.3)

## 2014-01-23 LAB — URINALYSIS, ROUTINE W REFLEX MICROSCOPIC
BILIRUBIN URINE: NEGATIVE
Glucose, UA: NEGATIVE mg/dL
Ketones, ur: NEGATIVE mg/dL
Leukocytes, UA: NEGATIVE
Nitrite: NEGATIVE
Protein, ur: NEGATIVE mg/dL
Specific Gravity, Urine: <1.005 — ABNORMAL LOW (ref 1.005–1.030)
UROBILINOGEN UA: 0.2 mg/dL (ref 0.0–1.0)
pH: 5.5 (ref 5.0–8.0)

## 2014-01-23 LAB — CBC WITH DIFFERENTIAL/PLATELET
BASOS PCT: 0 % (ref 0–1)
Basophils Absolute: 0 10*3/uL (ref 0.0–0.1)
EOS ABS: 0.1 10*3/uL (ref 0.0–0.7)
Eosinophils Relative: 1 % (ref 0–5)
HCT: 38.5 % — ABNORMAL LOW (ref 39.0–52.0)
Hemoglobin: 13 g/dL (ref 13.0–17.0)
LYMPHS ABS: 1.1 10*3/uL (ref 0.7–4.0)
Lymphocytes Relative: 18 % (ref 12–46)
MCH: 32.7 pg (ref 26.0–34.0)
MCHC: 33.8 g/dL (ref 30.0–36.0)
MCV: 97 fL (ref 78.0–100.0)
MONO ABS: 0.8 10*3/uL (ref 0.1–1.0)
Monocytes Relative: 13 % — ABNORMAL HIGH (ref 3–12)
NEUTROS PCT: 68 % (ref 43–77)
Neutro Abs: 4.3 10*3/uL (ref 1.7–7.7)
Platelets: 397 10*3/uL (ref 150–400)
RBC: 3.97 MIL/uL — ABNORMAL LOW (ref 4.22–5.81)
RDW: 12.6 % (ref 11.5–15.5)
WBC: 6.3 10*3/uL (ref 4.0–10.5)

## 2014-01-23 LAB — URINE MICROSCOPIC-ADD ON

## 2014-01-23 MED ORDER — ADULT MULTIVITAMIN W/MINERALS CH
1.0000 | ORAL_TABLET | Freq: Every day | ORAL | Status: DC
  Administered 2014-01-24: 1 via ORAL
  Filled 2014-01-23: qty 1

## 2014-01-23 MED ORDER — ACETAMINOPHEN 325 MG PO TABS: 650.0000 mg | ORAL_TABLET | Freq: Four times a day (QID) | ORAL | Status: DC | PRN

## 2014-01-23 MED ORDER — HEPARIN SODIUM (PORCINE) 5000 UNIT/ML IJ SOLN: 5000.0000 [IU] | Freq: Three times a day (TID) | INTRAMUSCULAR | Status: DC

## 2014-01-23 MED ORDER — ONDANSETRON HCL 4 MG PO TABS: 4.0000 mg | ORAL_TABLET | Freq: Four times a day (QID) | ORAL | Status: DC | PRN

## 2014-01-23 MED ORDER — VITAMIN B-1 100 MG PO TABS
100.0000 mg | ORAL_TABLET | Freq: Every day | ORAL | Status: DC
  Administered 2014-01-24: 100 mg via ORAL
  Filled 2014-01-23: qty 1

## 2014-01-23 MED ORDER — ACETAMINOPHEN 650 MG RE SUPP: 650.0000 mg | Freq: Four times a day (QID) | RECTAL | Status: DC | PRN

## 2014-01-23 MED ORDER — ALPRAZOLAM 1 MG PO TABS
1.0000 mg | ORAL_TABLET | Freq: Three times a day (TID) | ORAL | Status: DC
  Administered 2014-01-23 – 2014-01-24 (×2): 1 mg via ORAL
  Filled 2014-01-23 (×2): qty 1

## 2014-01-23 MED ORDER — ASPIRIN EC 81 MG PO TBEC
81.0000 mg | DELAYED_RELEASE_TABLET | Freq: Every day | ORAL | Status: DC
  Administered 2014-01-24: 81 mg via ORAL
  Filled 2014-01-23: qty 1

## 2014-01-23 MED ORDER — VANCOMYCIN HCL 10 G IV SOLR
1250.0000 mg | Freq: Once | INTRAVENOUS | Status: AC
  Administered 2014-01-24: 1250 mg via INTRAVENOUS
  Filled 2014-01-23: qty 1250

## 2014-01-23 MED ORDER — ZOLPIDEM TARTRATE 5 MG PO TABS: 5.0000 mg | ORAL_TABLET | Freq: Every evening | ORAL | Status: DC | PRN

## 2014-01-23 MED ORDER — OXYCODONE-ACETAMINOPHEN 5-325 MG PO TABS
1.0000 | ORAL_TABLET | Freq: Four times a day (QID) | ORAL | Status: DC | PRN
  Administered 2014-01-23 – 2014-01-24 (×2): 2 via ORAL
  Filled 2014-01-23 (×2): qty 2

## 2014-01-23 MED ORDER — ONDANSETRON HCL 4 MG/2ML IJ SOLN: 4.0000 mg | Freq: Four times a day (QID) | INTRAMUSCULAR | Status: DC | PRN

## 2014-01-23 MED ORDER — FOLIC ACID 1 MG PO TABS
1.0000 mg | ORAL_TABLET | Freq: Every day | ORAL | Status: DC
  Administered 2014-01-24: 1 mg via ORAL
  Filled 2014-01-23: qty 1

## 2014-01-23 MED ORDER — SODIUM CHLORIDE 0.9 % IV SOLN
INTRAVENOUS | Status: DC
  Administered 2014-01-23: 23:00:00 via INTRAVENOUS

## 2014-01-23 NOTE — ED Provider Notes (Signed)
CSN: [id number]     Arrival date & time 01/23/14  1745 History   First MD Initiated Contact with Patient 01/23/14 1823   This chart was scribed for [name] Lennert, MD by Gwenevere Abbot, ED scribe. This patient was seen in room APA12/APA12 and the patient's care was started at 6:30 PM.    Chief Complaint  Patient presents with  . Blood Infection   The history is provided by the patient. No language interpreter was used.   HPI Comments:  TREV BOLEY is a 38 y.o. male who presents to the Emergency Department complaining of difficulty urinating. Pt states that his catheter cord was pulled and now his urine is darker in color. Pt also reports that he noticed blood in his urine. Pt states that he was recently discharged form the hospital and was supposed to take antibiotic for staph infection of the blood, but he was unable to afford it. Pt reports that he was advised to return to hospital for admittance.    Past Medical History  Diagnosis Date  . Hypertension   . Anxiety    Past Surgical History  Procedure Laterality Date  . Tee without cardioversion N/A 01/21/2014    Procedure: TRANSESOPHAGEAL ECHOCARDIOGRAM (TEE) with propofol;  Surgeon: Antoine Poche, MD;  Location: [facility];  Service: Endoscopy;  Laterality: N/A;   History reviewed. No pertinent family history. History  Substance Use Topics  . Smoking status: Current Every Day Smoker -- 0.50 packs/day  . Smokeless tobacco: Not on file  . Alcohol Use: Yes     Comment: occ    Review of Systems  Constitutional: Negative for appetite change and fatigue.  HENT: Negative for congestion, ear discharge and sinus pressure.   Eyes: Negative for discharge.  Respiratory: Negative for cough.   Cardiovascular: Negative for chest pain.  Gastrointestinal: Negative for abdominal pain and diarrhea.  Genitourinary: Negative for frequency and hematuria.  Musculoskeletal: Negative for back pain.  Skin: Negative for rash.  Neurological:  Negative for seizures and headaches.  Psychiatric/Behavioral: Negative for hallucinations.      Allergies  Review of patient's allergies indicates no known allergies.  Home Medications   Prior to Admission medications   Medication Sig Start Date End Date Taking? Authorizing Provider  ALPRAZolam Prudy Feeler) 1 MG tablet Take 1 mg by mouth 3 (three) times daily.    Yes Historical Provider, MD  aspirin EC 81 MG tablet Take 81 mg by mouth daily.   Yes Historical Provider, MD  ibuprofen (ADVIL,MOTRIN) 800 MG tablet Take 1 tablet (800 mg total) by mouth 3 (three) times daily. 01/16/14  Yes Kristen N Ward, DO  ibuprofen (ADVIL,MOTRIN) 800 MG tablet Take 400-800 mg by mouth every 8 (eight) hours as needed.   Yes Historical Provider, MD  oxyCODONE-acetaminophen (PERCOCET/ROXICET) 5-325 MG per tablet Take 1-2 tablets by mouth every 6 (six) hours as needed. 01/21/14  Yes Jerald Kief, MD  linezolid (ZYVOX) 600 MG tablet Take 1 tablet (600 mg total) by mouth 2 (two) times daily. 01/21/14   Jerald Kief, MD   BP 130/89  Pulse 105  Temp(Src) 98.4 F (36.9 C) (Oral)  Resp 20  Ht 6' (1.829 m)  Wt 180 lb (81.647 kg)  BMI 24.41 kg/m2  SpO2 99% Physical Exam  Constitutional: He is oriented to person, place, and time. He appears well-developed.  HENT:  Head: Normocephalic.  Eyes: Conjunctivae and EOM are normal. No scleral icterus.  Neck: Neck supple. No thyromegaly present.  Cardiovascular: Normal rate and regular rhythm.  Exam reveals no gallop and no friction rub.   No murmur heard. Pulmonary/Chest: No stridor. He has no wheezes. He has no rales. He exhibits no tenderness.  Abdominal: He exhibits no distension. There is no tenderness. There is no rebound.  Genitourinary:  Folley catheter  Musculoskeletal: Normal range of motion. He exhibits no edema.  Lymphadenopathy:    He has no cervical adenopathy.  Neurological: He is oriented to person, place, and time. He exhibits normal muscle tone.  Coordination normal.  Skin: No rash noted. No erythema.  Psychiatric: He has a normal mood and affect. His behavior is normal.    ED Course  Procedures DIAGNOSTIC STUDIES: Oxygen Saturation is 99% on RA, normal by my interpretation.  COORDINATION OF CARE: 6:35 PM-Discussed treatment plan with pt at bedside and pt agreed to plan.  Labs Review Labs Reviewed  CBC WITH DIFFERENTIAL - Abnormal; Notable for the following:    RBC 3.97 (*)    HCT 38.5 (*)    Monocytes Relative 13 (*)    All other components within normal limits  COMPREHENSIVE METABOLIC PANEL - Abnormal; Notable for the following:    Potassium 3.6 (*)    All other components within normal limits  URINALYSIS, ROUTINE W REFLEX MICROSCOPIC    Imaging Review No results found.   EKG Interpretation None      MDM   Final diagnoses:  None    The chart was scribed for me under my direct supervision.  I personally performed the history, physical, and medical decision making and all procedures in the evaluation of this patient.[name] [name].    Madalene Mickler L Cidney Kirkwood, MD 01/23/14 2121

## 2014-01-23 NOTE — H&P (Signed)
[facility] History and Physical  [name] LimboDoyle T Quillin WUJ:[id number] DOB: 11-Sep-1975 DOA: 01/23/2014  Referring physician: Bethann BerkshireJoseph Zammit, MD PCP: No primary provider on file.   Chief Complaint: Bacteremia  HPI: [name] LimboDoyle T Bedingfield is a 38 y.o. male presents as a direct admit for bacteremia. He was recently in the hospital with a MSSA bactermia and was discharged home with instructions to get a script for zyvox filled. Unfortunately he was not able to afford the medication and therefore did not get it filled. He was told to come back to the hospital for admission and placement of a PICC line. It is planned that he will be transferred to SNF for ongoing iV antibiotics.   Review of Systems:  Constitutional:  No weight loss, night sweats, Fevers, chills HEENT:  No headaches  Cardio-vascular:  No chest pain, Orthopnea, palpitations  GI:  No heartburn, indigestion, abdominal pain, nausea, vomiting, diarrhea Resp:  No shortness of breath with exertion or at rest. No excess mucus, No wheezing  Skin:  no rash or lesions.  GU:  no dysuria, change in color of urine Musculoskeletal:  No joint pain or swelling.  Psych:  No change in mood or affect  Past Medical History  Diagnosis Date  . Hypertension   . Anxiety    Past Surgical History  Procedure Laterality Date  . Tee without cardioversion N/A 01/21/2014    Procedure: TRANSESOPHAGEAL ECHOCARDIOGRAM (TEE) with propofol;  Surgeon: Antoine PocheJonathan F Branch, MD;  Location: [facility];  Service: Endoscopy;  Laterality: N/A;   Social History:  reports that he has been smoking.  He does not have any smokeless tobacco history on file. He reports that he drinks alcohol. He reports that he does not use illicit drugs.  No Known Allergies  History reviewed. No pertinent family history.   Prior to Admission medications   Medication Sig Start Date End Date Taking? Authorizing Provider  ALPRAZolam Prudy Feeler(XANAX) 1 MG tablet Take 1 mg by mouth 3 (three) times  daily.    Yes Historical Provider, MD  aspirin EC 81 MG tablet Take 81 mg by mouth daily.   Yes Historical Provider, MD  ibuprofen (ADVIL,MOTRIN) 800 MG tablet Take 1 tablet (800 mg total) by mouth 3 (three) times daily. 01/16/14  Yes Kristen N Ward, DO  ibuprofen (ADVIL,MOTRIN) 800 MG tablet Take 400-800 mg by mouth every 8 (eight) hours as needed.   Yes Historical Provider, MD  oxyCODONE-acetaminophen (PERCOCET/ROXICET) 5-325 MG per tablet Take 1-2 tablets by mouth every 6 (six) hours as needed. 01/21/14  Yes Jerald KiefStephen K Chiu, MD  linezolid (ZYVOX) 600 MG tablet Take 1 tablet (600 mg total) by mouth 2 (two) times daily. 01/21/14   Jerald KiefStephen K Chiu, MD   Physical Exam: Filed Vitals:   01/23/14 1755  BP: 130/89  Pulse: 105  Temp: 98.4 F (36.9 C)  TempSrc: Oral  Resp: 20  Height: 6' (1.829 m)  Weight: 81.647 kg (180 lb)  SpO2: 99%    Wt Readings from Last 3 Encounters:  01/23/14 81.647 kg (180 lb)  01/18/14 77.792 kg (171 lb 8 oz)  01/18/14 77.792 kg (171 lb 8 oz)    General:  Appears calm and comfortable Eyes: PERRL, normal lids, irises & conjunctiva ENT: grossly normal hearing, extremely poor oral hygiene Neck: no LAD, masses or thyromegaly Cardiovascular: RRR, no m/r/g. No LE edema. Respiratory: CTA bilaterally, no w/r/r. Normal respiratory effort. Abdomen: soft, ntnd Skin: no rash or induration seen on limited exam Musculoskeletal: grossly normal tone BUE/BLE  Psychiatric: grossly normal mood and affect, speech fluent and appropriate Neurologic: grossly non-focal.          Labs on Admission:  Basic Metabolic Panel:  Recent Labs Lab 01/18/14 1345 01/19/14 0545 01/21/14 0538 01/23/14 1858  NA  --  139 142 141  K  --  3.5* 4.0 3.6*  CL  --  100 103 99  CO2  --  25 26 28   GLUCOSE  --  108* 116* 91  BUN  --  7 11 7   CREATININE 0.73 0.65 0.71 0.76  CALCIUM  --  9.2 9.3 9.5   Liver Function Tests:  Recent Labs Lab 01/19/14 0545 01/21/14 0538 01/23/14 1858  AST 15  19 13   ALT 13 9 7   ALKPHOS 142* 133* 110  BILITOT 0.8 0.4 0.5  PROT 7.0 7.2 7.8  ALBUMIN 3.5 3.3* 3.8   No results found for this basename: LIPASE, AMYLASE,  in the last 168 hours No results found for this basename: AMMONIA,  in the last 168 hours CBC:  Recent Labs Lab 01/18/14 1345 01/19/14 0545 01/20/14 0613 01/21/14 0538 01/23/14 1858  WBC 8.6 7.6 7.1 6.6 6.3  NEUTROABS  --   --   --   --  4.3  HGB 14.7 12.7* 12.9* 12.4* 13.0  HCT 43.0 38.0* 38.8* 37.3* 38.5*  MCV 96.8 96.7 97.0 96.4 97.0  PLT 281 274 258 287 397   Cardiac Enzymes: No results found for this basename: CKTOTAL, CKMB, CKMBINDEX, TROPONINI,  in the last 168 hours  BNP (last 3 results) No results found for this basename: PROBNP,  in the last 8760 hours CBG: No results found for this basename: GLUCAP,  in the last 168 hours  Radiological Exams on Admission: No results found.   Assessment/Plan Active Problems:   Tobacco abuse   Staphylococcus aureus bacteremia   Staph aureus infection   1. MSSA Bacteremia -he was not able to get his medications filled due to cost -will admit for IV vancocin -he will need to be placed for ongoing treatment -has history of IVDU and therefore is high risk for outpatient IV therapy -will need PICC line and then hopefully be discharged  2. Tobacco Use -patient was counseled on tobacco cessation  Code Status: Full Code (must indicate code status--if unknown or must be presumed, indicate so) DVT Prophylaxis:Heparin Family Communication: Wife (indicate person spoken with, if applicable, with phone number if by telephone) Disposition Plan: SNF (indicate anticipated LOS)  Time spent:  [facility] Pager [phone]  **Disclaimer: This note may have been dictated with voice recognition software. Similar sounding words can inadvertently be transcribed and this note may contain transcription errors which may not have been corrected upon  publication of note.**

## 2014-01-23 NOTE — ED Notes (Addendum)
Pt reports was released from hospital with abx px for staph infection x2 days ago. Pt reports could not afford px and reports that is pending for a direct admit but reports could not wait any longer. Pt reports intermittent fevers, malasie. Pt also reports has foley that was placed during admission and reports urine was clear x2 days ago. Urine in drainage bag in triage is tea colored. Pt alert and oriented, skin pale.

## 2014-01-23 NOTE — Progress Notes (Signed)
ANTIBIOTIC CONSULT NOTE-Preliminary  Pharmacy Consult for Vancomycin Indication: Bacteremia  No Known Allergies  Patient Measurements: Height: 6' (182.9 cm) Weight: 180 lb (81.647 kg) IBW/kg (Calculated) : 77.6   Vital Signs: Temp: 98.7 F (37.1 C) (08/13 2300) Temp src: Oral (08/13 2300) BP: 141/80 mmHg (08/13 2300) Pulse Rate: 85 (08/13 2300)  Labs:  Recent Labs  01/21/14 0538 01/23/14 1858  WBC 6.6 6.3  HGB 12.4* 13.0  PLT 287 397  CREATININE 0.71 0.76    Estimated Creatinine Clearance: 140.1 ml/min (by C-G formula based on Cr of 0.76).  No results found for this basename: VANCOTROUGH, [name] [name], VANCORANDOM, GENTTROUGH, GENTPEAK, GENTRANDOM, TOBRATROUGH, TOBRAPEAK, TOBRARND, AMIKACINPEAK, AMIKACINTROU, AMIKACIN,  in the last 72 hours   Microbiology: Recent Results (from the past 720 hour(s))  CULTURE, BLOOD (ROUTINE X 2)     Status: None   Collection Time    01/16/14  5:33 PM      Result Value Ref Range Status   Specimen Description BLOOD RIGHT ARM   Final   Special Requests BOTTLES DRAWN AEROBIC AND ANAEROBIC [facility]   Final   Culture  Setup Time     Final   Value: 01/17/2014 20:08     Performed at [facility]   Culture     Final   Value: STAPHYLOCOCCUS AUREUS     Note: RIFAMPIN AND GENTAMICIN SHOULD NOT BE USED AS SINGLE DRUGS FOR TREATMENT OF STAPH INFECTIONS. This organism is presumed to be Clindamycin resistant based on detection of inducible Clindamycin resistance.     Note: Gram Stain Report Called to,Read Back By and Verified With: L MILLER AT 1410 ON 01/17/14 BY S VANHOORNE     Performed at [facility]   Report Status 01/19/2014 FINAL   Final   Organism ID, Bacteria STAPHYLOCOCCUS AUREUS   Final  CULTURE, BLOOD (ROUTINE X 2)     Status: None   Collection Time    01/16/14  5:38 PM      Result Value Ref Range Status   Specimen Description BLOOD LEFT ARM   Final   Special Requests BOTTLES DRAWN AEROBIC AND ANAEROBIC [facility]   Final   Culture  Setup Time     Final   Value: 01/17/2014 15:17     Performed at [facility]   Culture     Final   Value: STAPHYLOCOCCUS AUREUS     Note: SUSCEPTIBILITIES PERFORMED ON PREVIOUS CULTURE WITHIN THE LAST 5 DAYS.     Note: CRITICAL RESULT CALLED TO, READ BACK BY AND VERIFIED WITH: SHANNON GAMMONS 01/18/14 @ 9:48AM BY RUSCOE A.     Performed at [facility]   Report Status 01/19/2014 FINAL   Final  CULTURE, BLOOD (ROUTINE X 2)     Status: None   Collection Time    01/19/14  5:51 PM      Result Value Ref Range Status   Specimen Description BLOOD LEFT ANTECUBITAL   Final   Special Requests BOTTLES DRAWN AEROBIC AND ANAEROBIC 7CC   Final   Culture NO GROWTH 4 DAYS   Final   Report Status PENDING   Incomplete  CULTURE, BLOOD (ROUTINE X 2)     Status: None   Collection Time    01/19/14  5:59 PM      Result Value Ref Range Status   Specimen Description BLOOD LEFT HAND   Final   Special Requests BOTTLES DRAWN AEROBIC AND ANAEROBIC 6CC   Final   Culture NO GROWTH 4  DAYS   Final   Report Status PENDING   Incomplete    Medical History: Past Medical History  Diagnosis Date  . Hypertension   . Anxiety     Medications:   Assessment: 38 yo male admitted for IV Vancomycin for Staph aureus bacteremia. Pt recently hospitalized and received Vancomycin 8/9-8/11 per pharmacist dosing protocol. Will restart Vancomycin with previous recommended dose.    Goal of Therapy:  Vancomycin troughs 15-20 mcg/ml  Plan:  Preliminary review of pertinent patient information completed.  Protocol will be initiated with a one-time dose of Vancomycin 1250 mg IV.  [name] HawkingAnnie [name] clinical pharmacist will complete review during morning rounds to assess patient and finalize treatment regimen.  [name] [name], [name] [name], [facility] 01/23/2014,11:43 PM

## 2014-01-24 ENCOUNTER — Encounter (HOSPITAL_COMMUNITY): Admit: 2014-01-24 | Payer: Self-pay

## 2014-01-24 ENCOUNTER — Encounter (HOSPITAL_COMMUNITY): Payer: Self-pay | Admitting: *Deleted

## 2014-01-24 DIAGNOSIS — A4901 Methicillin susceptible Staphylococcus aureus infection, unspecified site: Secondary | ICD-10-CM

## 2014-01-24 LAB — COMPREHENSIVE METABOLIC PANEL
ALBUMIN: 3.2 g/dL — AB (ref 3.5–5.2)
ALT: 7 U/L (ref 0–53)
AST: 11 U/L (ref 0–37)
Alkaline Phosphatase: 95 U/L (ref 39–117)
Anion gap: 10 (ref 5–15)
BUN: 9 mg/dL (ref 6–23)
CALCIUM: 9.5 mg/dL (ref 8.4–10.5)
CO2: 31 mEq/L (ref 19–32)
CREATININE: 0.82 mg/dL (ref 0.50–1.35)
Chloride: 102 mEq/L (ref 96–112)
GFR calc Af Amer: >90 mL/min (ref >90)
GFR calc non Af Amer: >90 mL/min (ref >90)
GLUCOSE: 105 mg/dL — AB (ref 70–99)
Potassium: 3.8 mEq/L (ref 3.7–5.3)
Sodium: 143 mEq/L (ref 137–147)
TOTAL PROTEIN: 6.9 g/dL (ref 6.0–8.3)
Total Bilirubin: 0.5 mg/dL (ref 0.3–1.2)

## 2014-01-24 LAB — CBC
HEMATOCRIT: 39 % (ref 39.0–52.0)
HEMOGLOBIN: 12.8 g/dL — AB (ref 13.0–17.0)
MCH: 32.3 pg (ref 26.0–34.0)
MCHC: 32.8 g/dL (ref 30.0–36.0)
MCV: 98.5 fL (ref 78.0–100.0)
Platelets: 370 10*3/uL (ref 150–400)
RBC: 3.96 MIL/uL — ABNORMAL LOW (ref 4.22–5.81)
RDW: 12.8 % (ref 11.5–15.5)
WBC: 5.7 10*3/uL (ref 4.0–10.5)

## 2014-01-24 LAB — CULTURE, BLOOD (ROUTINE X 2)
Culture: NO GROWTH
Culture: NO GROWTH

## 2014-01-24 LAB — TSH: TSH: 1.2 u[IU]/mL (ref 0.350–4.500)

## 2014-01-24 MED ORDER — TAMSULOSIN HCL 0.4 MG PO CAPS: 0.4000 mg | ORAL_CAPSULE | Freq: Every day | ORAL | Status: DC

## 2014-01-24 MED ORDER — VANCOMYCIN HCL 10 G IV SOLR: 1500.0000 mg | Freq: Two times a day (BID) | INTRAVENOUS | Status: DC

## 2014-01-24 MED ORDER — OXYCODONE-ACETAMINOPHEN 5-325 MG PO TABS: 1.0000 | ORAL_TABLET | Freq: Four times a day (QID) | ORAL | Status: DC | PRN

## 2014-01-24 MED ORDER — VANCOMYCIN HCL 10 G IV SOLR
1250.0000 mg | Freq: Three times a day (TID) | INTRAVENOUS | Status: DC
  Administered 2014-01-24: 1250 mg via INTRAVENOUS
  Filled 2014-01-24 (×10): qty 1250

## 2014-01-24 NOTE — Discharge Instructions (Signed)
Bacteremia °Bacteremia occurs when bacteria get in your blood. Normal blood does not usually have bacteria. Bacteremia is one way infections can spread from one part of the body to another. °CAUSES  °· Causes may include anything that allows bacteria to get into the body. Examples are: °¨ Catheters. °¨ Intravenous (IV) access tubes. °¨ Cuts or scrapes of the skin. °· Temporary bacteremia may occur during dental procedures, while brushing your teeth, or during a bowel movement. This rarely causes any symptoms or medical problems. °· Bacteria may also get in the bloodstream as a complication of a bacterial infection elsewhere. This includes infected wounds and bacterial infections of the: °¨ Lungs (pneumonia). °¨ Kidneys (pyelonephritis). °¨ Intestines (enteritis, colitis). °¨ Organs in the abdomen (appendicitis, cholecystitis, diverticulitis). °SYMPTOMS  °The body is usually able to clear small numbers of bacteria out of the blood quickly. Brief bacteremia usually does not cause problems.  °· Problems can occur if the bacteria start to grow in number or spread to other parts of the body. If the bacteria start growing, you may develop: °¨ Chills. °¨ Fever. °¨ Nausea. °¨ Vomiting. °¨ Sweating. °¨ Lightheadedness and low blood pressure. °¨ Pain. °· If bacteria start to grow in the linings around the brain, it is called meningitis. This can cause severe headaches, many other problems, and even death. °· If bacteria start to grow in a joint, it causes arthritis with painful joints. If bacteria start to grow in a bone, it is called osteomyelitis. °· Bacteria from the blood can also cause sores (abscesses) in many organs, such as the muscle, liver, spleen, lungs, brain, and kidneys. °DIAGNOSIS  °· This condition is diagnosed by cultures of the blood. °· Cultures may also be taken from other parts of the body that are thought to be causing the bacteremia. A small piece of tissue, fluid, or other product of the body is  sampled. The sample is then put on a growth plate to see if any bacteria grows. °· Other lab tests may be done and the results may be abnormal. °TREATMENT  °Treatment requires a stay in the hospital. You will be given antibiotic medicine through an IV access tube. °PREVENTION  °People with an increased risk of developing bacteremia or complications may be given antibiotics before certain procedures. Examples are: °· A person with a heart murmur or artificial heart valve, before having his or her teeth cleaned. °· Before having a surgical or other invasive procedure. °· Before having a bowel procedure. °Document Released: 03/13/2006 Document Revised: 08/22/2011 Document Reviewed: 12/23/2010 °ExitCare® Patient Information ©2015 [employer]. This information is not intended to replace advice given to you by your health care provider. Make sure you discuss any questions you have with your health care provider. ° °

## 2014-01-24 NOTE — Progress Notes (Signed)
ANTIBIOTIC CONSULT NOTE  Pharmacy Consult for Vancomycin  Indication: Bacteremia  No Known Allergies  Patient Measurements: Height: 6' (182.9 cm) Weight: 180 lb (81.647 kg) IBW/kg (Calculated) : 77.6  Vital Signs: Temp: 97.8 F (36.6 C) (08/14 0616) Temp src: Oral (08/14 0616) BP: 110/64 mmHg (08/14 0616) Pulse Rate: 64 (08/14 0616) Intake/Output from previous day: 08/13 0701 - 08/14 0700 In: -  Out: 525 [Urine:525] Intake/Output from this shift:    Labs:  Recent Labs  01/23/14 1858 01/24/14 0627  WBC 6.3 5.7  HGB 13.0 12.8*  PLT 397 370  CREATININE 0.76 0.82   Estimated Creatinine Clearance: 136.7 ml/min (by C-G formula based on Cr of 0.82). No results found for this basename: VANCOTROUGH, Leodis BinetVANCOPEAK, VANCORANDOM, GENTTROUGH, GENTPEAK, GENTRANDOM, TOBRATROUGH, TOBRAPEAK, TOBRARND, AMIKACINPEAK, AMIKACINTROU, AMIKACIN,  in the last 72 hours   Microbiology: Recent Results (from the past 720 hour(s))  CULTURE, BLOOD (ROUTINE X 2)     Status: None   Collection Time    01/16/14  5:33 PM      Result Value Ref Range Status   Specimen Description BLOOD RIGHT ARM   Final   Special Requests BOTTLES DRAWN AEROBIC AND ANAEROBIC [facility]   Final   Culture  Setup Time     Final   Value: 01/17/2014 20:08     [facility]   Culture     Final   Value: STAPHYLOCOCCUS AUREUS     Note: RIFAMPIN AND GENTAMICIN SHOULD NOT BE USED AS SINGLE DRUGS FOR TREATMENT OF STAPH INFECTIONS. This organism is presumed to be Clindamycin resistant based on detection of inducible Clindamycin resistance.     Note: Gram Stain Report Called to,Read Back By and Verified With: L MILLER AT 1410 ON 01/17/14 BY S VANHOORNE     [facility]   Report Status 01/19/2014 FINAL   Final   Organism ID, Bacteria STAPHYLOCOCCUS AUREUS   Final  CULTURE, BLOOD (ROUTINE X 2)     Status: None   Collection Time    01/16/14  5:38 PM      Result Value Ref Range Status   Specimen  Description BLOOD LEFT ARM   Final   Special Requests BOTTLES DRAWN AEROBIC AND ANAEROBIC [facility]   Final   Culture  Setup Time     Final   Value: 01/17/2014 15:17     [facility]   Culture     Final   Value: STAPHYLOCOCCUS AUREUS     Note: SUSCEPTIBILITIES PERFORMED ON PREVIOUS CULTURE WITHIN THE LAST 5 DAYS.     Note: CRITICAL RESULT CALLED TO, READ BACK BY AND VERIFIED WITH: SHANNON GAMMONS 01/18/14 @ 9:48AM BY RUSCOE A.     [facility]   Report Status 01/19/2014 FINAL   Final  CULTURE, BLOOD (ROUTINE X 2)     Status: None   Collection Time    01/19/14  5:51 PM      Result Value Ref Range Status   Specimen Description BLOOD LEFT ANTECUBITAL   Final   Special Requests BOTTLES DRAWN AEROBIC AND ANAEROBIC 7[facility]   Final   Culture NO GROWTH 4 DAYS   Final   Report Status PENDING   Incomplete  CULTURE, BLOOD (ROUTINE X 2)     Status: None   Collection Time    01/19/14  5:59 PM      Result Value Ref Range Status   Specimen Description BLOOD LEFT HAND  Final   Special Requests BOTTLES DRAWN AEROBIC AND ANAEROBIC 6CC   Final   Culture NO GROWTH 4 DAYS   Final   Report Status PENDING   Incomplete   Medical History: Past Medical History  Diagnosis Date  . Hypertension   . Anxiety    Medications:  Scheduled:  . ALPRAZolam  1 mg Oral TID  . aspirin EC  81 mg Oral Daily  . folic acid  1 mg Oral Daily  . heparin  5,000 Units Subcutaneous 3 times per day  . multivitamin with minerals  1 tablet Oral Daily  . thiamine  100 mg Oral Daily  . vancomycin  1,250 mg Intravenous Q8H   Assessment: Patient returns to ED due to positive blood culture, staph aureus.  Pharmacy consult for Vancomycin Excellent renal function.  Pt was discharged with Zyvox to finish up abx therapy but could not afford it and was readmitted. ID note reviewed from 01/21/14:   Change antibiotic therapy to ANCEF 2 GRAMS IV Q 8 BUT BECAUSE NOT A GOOD CANDIDATE FOR IV THERAPY AT HOME  AND WANTS TO GO HOME INSTEAD OF TO SNF WITH IV ABX WOULD FINISH HIS COURSE WITH ORAL ZYVOX 600MG  Q 12 HOURS   Goal of Therapy:  Vancomycin trough level 15-20 mcg/ml  Plan:  Vancomycin 1250 mg IV every 8 hours for now Vancomycin trough at steady state Consider d/c Vanc and resume Ancef or reconsult ID Monitor renal function, progress, and cultures Labs per protocol  Valrie Hart A 01/24/2014,7:55 AM

## 2014-01-24 NOTE — Care Management Note (Signed)
    Page 1 of 1   01/24/2014     4:38:46 PM CARE MANAGEMENT NOTE 01/24/2014  Patient:  [name] [name],[name] [name]   Account Number:  [id number]  Date Initiated:  01/24/2014  Documentation initiated by:  Anibal HendersonBOLDEN,Angeline Trick  Subjective/Objective Assessment:   Admitted with bacteremia. Pt was D/C home with a voucher for po ABX, but was $4000, still, and pt could not afford this. He has come back in for IV per ID recommendation- pt refused SNF with a PICC so will do Out pt with a peripherial IV     Action/Plan:   each time---Set up with out patient clinic for IV Vancomycin BID through 8/22--8am and 7 pm and pt and girlfriend aware   Anticipated DC Date:  01/24/2014   Anticipated DC Plan:  HOME/SELF CARE  In-house referral  Clinical Social Worker      DC Planning Services  CM consult      Choice offered to / List presented to:             Status of service:  Completed, signed off Medicare Important Message given?   (If response is "NO", the following Medicare IM given date fields will be blank) Date Medicare IM given:   Medicare IM given by:   Date Additional Medicare IM given:   Additional Medicare IM given by:    Discharge Disposition:  HOME/SELF CARE  Per UR Regulation:  Reviewed for med. necessity/level of care/duration of stay  If discussed at Long Length of Stay Meetings, dates discussed:    Comments:  01/24/14 1300 Anibal HendersonGeneva Mellonie Guess RN/CM Spoke with Selena BattenKim in Day Surgery and IV ABX set up, and pt  and girlfriend made aware of times and place to come for medication by Selena BattenKim and by this CM

## 2014-01-24 NOTE — Discharge Summary (Signed)
Physician Discharge Summary  [name] [name]:[id number] DOB: 1975/08/25 DOA: 01/23/2014  PCP: No primary provider on file.  Admit date: 01/23/2014 Discharge date: 01/24/2014  Time spent: 40 minutes  Recommendations for Outpatient Follow-up:  1. Patient will return to [facility] twice a day to receive IV vancomycin through 8/22 2. He'll followup with urology as previously scheduled to evaluate for bladder outlet obstruction. Foley catheter will be kept in place  Discharge Diagnoses:  Active Problems:   Tobacco abuse   Staphylococcus aureus bacteremia   Staph aureus infection  bladder outlet obstruction Low back pain  Discharge Condition: stable  Diet recommendation: regular diet  Filed Weights   01/23/14 1755  Weight: 81.647 kg (180 lb)    History of present illness and hospital course:  This is a 38 year old patient with a history of intravenous drug abuse who initially presented to the hospital on his last admission with complaints of back pain. He was found to have fevers and blood cultures were positive for methicillin sensitive Staphylococcus aureus. He was started on appropriate antibiotics. MRI lumbar spine did not indicate any signs of infection. He also underwent transthoracic echocardiogram as well as transesophageal echocardiogram which did not show any signs of endocarditis. He also had repeat cultures drawn during that visit that showed no growth. With his history of drug abuse, he was not felt to be a safe candidate to be discharged with a PICC line in place for outpatient antibiotics. Initially plans were to do outpatient oral linezolid. Unfortunately this was not covered by the medication voucher that he received due to cost. Case was discussed with infectious disease and recommendations were for the patient to be readmitted to arrange outpatient intravenous antibiotics. Patient was readmitted to the hospital and was initially offered PICC line placement with  transfer to nursing home to complete his course of antibiotics. Patient declined nursing home placement and wish to pursue his antibiotics as an outpatient. Arrangements have been made for the patient to return to [facility] twice a day and receive vancomycin which should appropriately cover his staphylococcal infection. Arrangements have been made for him to receive antibiotics through 8/22. The patient also has Foley catheter which was placed in the emergency room on his previous visit. It was felt that he may have bladder outlet obstruction. He was started on Flomax. He already has an appointment set up with outpatient urology.  Procedures:    Consultations:    Discharge Exam: Filed Vitals:   01/24/14 0616  BP: 110/64  Pulse: 64  Temp: 97.8 F (36.6 C)  Resp: 18    General: NAD Cardiovascular: S1, S2 RRR Respiratory: CTA B  Discharge Instructions You were cared for by a hospitalist during your hospital stay. If you have any questions about your discharge medications or the care you received while you were in the hospital after you are discharged, you can call the unit and asked to speak with the hospitalist on call if the hospitalist that took care of you is not available. Once you are discharged, your primary care physician will handle any further medical issues. Please note that NO REFILLS for any discharge medications will be authorized once you are discharged, as it is imperative that you return to your primary care physician (or establish a relationship with a primary care physician if you do not have one) for your aftercare needs so that they can reassess your need for medications and monitor your lab values.  Discharge Instructions  Call MD for:  redness, tenderness, or signs of infection (pain, swelling, redness, odor or green/yellow discharge around incision site)    Complete by:  As directed      Call MD for:  temperature >100.4    Complete by:  As directed       Diet general    Complete by:  As directed      Increase activity slowly    Complete by:  As directed             Medication List    STOP taking these medications       linezolid 600 MG tablet  Commonly known as:  ZYVOX      TAKE these medications       ALPRAZolam 1 MG tablet  Commonly known as:  XANAX  Take 1 mg by mouth 3 (three) times daily.     aspirin EC 81 MG tablet  Take 81 mg by mouth daily.     ibuprofen 800 MG tablet  Commonly known as:  ADVIL,MOTRIN  Take 400-800 mg by mouth every 8 (eight) hours as needed.     ibuprofen 800 MG tablet  Commonly known as:  ADVIL,MOTRIN  Take 1 tablet (800 mg total) by mouth 3 (three) times daily.     oxyCODONE-acetaminophen 5-325 MG per tablet  Commonly known as:  PERCOCET/ROXICET  Take 1-2 tablets by mouth every 6 (six) hours as needed.     tamsulosin 0.4 MG Caps capsule  Commonly known as:  FLOMAX  Take 1 capsule (0.4 mg total) by mouth daily after supper.     vancomycin 1,500 mg in sodium chloride 0.9 % 250 mL  Inject 1,500 mg into the vein every 12 (twelve) hours. Until 02/02/14       No Known Allergies     Follow-up Information   Follow up with follow up with urology as scheduled for foley catheter.      Follow up with [facility] a day for antibiotics until 8/23.       The results of significant diagnostics from this hospitalization (including imaging, microbiology, ancillary and laboratory) are listed below for reference.    Significant Diagnostic Studies: Mr Lumbar Spine W Wo Contrast  01/16/2014   CLINICAL DATA:  Fever, low back pain, and urinary retention.  EXAM: MRI LUMBAR SPINE WITHOUT AND WITH CONTRAST  TECHNIQUE: Multiplanar and multiecho pulse sequences of the lumbar spine were obtained without and with intravenous contrast.  CONTRAST:  15mL MULTIHANCE GADOBENATE DIMEGLUMINE 529 MG/ML IV SOLN  COMPARISON:  None.  FINDINGS: There is transitional lumbosacral anatomy. For this  dictation, the lowest well-formed intervertebral disc space will be labeled L5-S1, with a right-sided assimilation joint present at this level.  Vertebral alignment is normal. Vertebral body heights are preserved. Vertebral bone marrow signal is within normal limits. Disc desiccation and mild disc space narrowing are present at L4-5. Other intervertebral discs are well hydrated and maintained in height. The conus medullaris is normal in signal and terminates at the superior aspect of L1. No epidural abscess or other fluid collection is seen. Paraspinal soft tissues are unremarkable.  L1-2 through L3-4:  Negative.  L4-5: Small left central disc protrusion results in narrowing of the left-greater-than-right lateral recesses and contacts the left L5 nerve root in the lateral recess. There is a small amount of enhancement at the left aspect of the herniated disc. No spinal canal or neural foraminal stenosis. Mild facet arthrosis.  L5-S1:  Negative.  IMPRESSION: Small L4-5 disc protrusion, which contacts the left L5 nerve root in the lateral recess without evidence of significant mass effect. No evidence of infection in the lumbar spine.   Electronically Signed   By: Sebastian Ache   On: 01/16/2014 16:14    Microbiology: Recent Results (from the past 240 hour(s))  CULTURE, BLOOD (ROUTINE X 2)     Status: None   Collection Time    01/16/14  5:33 PM      Result Value Ref Range Status   Specimen Description BLOOD RIGHT ARM   Final   Special Requests BOTTLES DRAWN AEROBIC AND ANAEROBIC [facility]   Final   Culture  Setup Time     Final   Value: 01/17/2014 20:08     Performed at [facility]   Culture     Final   Value: STAPHYLOCOCCUS AUREUS     Note: RIFAMPIN AND GENTAMICIN SHOULD NOT BE USED AS SINGLE DRUGS FOR TREATMENT OF STAPH INFECTIONS. This organism is presumed to be Clindamycin resistant based on detection of inducible Clindamycin resistance.     Note: Gram Stain Report Called to,Read Back By and  Verified With: L MILLER AT 1410 ON 01/17/14 BY S VANHOORNE     Performed at [facility]   Report Status 01/19/2014 FINAL   Final   Organism ID, Bacteria STAPHYLOCOCCUS AUREUS   Final  CULTURE, BLOOD (ROUTINE X 2)     Status: None   Collection Time    01/16/14  5:38 PM      Result Value Ref Range Status   Specimen Description BLOOD LEFT ARM   Final   Special Requests BOTTLES DRAWN AEROBIC AND ANAEROBIC [facility] [address]   Final   Culture  Setup Time     Final   Value: 01/17/2014 15:17     Performed at [facility]   Culture     Final   Value: STAPHYLOCOCCUS AUREUS     Note: SUSCEPTIBILITIES PERFORMED ON PREVIOUS CULTURE WITHIN THE LAST 5 DAYS.     Note: CRITICAL RESULT CALLED TO, READ BACK BY AND VERIFIED WITH: SHANNON GAMMONS 01/18/14 @ 9:48AM BY RUSCOE A.     Performed at [facility]   Report Status 01/19/2014 FINAL   Final  CULTURE, BLOOD (ROUTINE X 2)     Status: None   Collection Time    01/19/14  5:51 PM      Result Value Ref Range Status   Specimen Description BLOOD LEFT ANTECUBITAL   Final   Special Requests BOTTLES DRAWN AEROBIC AND ANAEROBIC 7CC   Final   Culture NO GROWTH 5 DAYS   Final   Report Status 01/24/2014 FINAL   Final  CULTURE, BLOOD (ROUTINE X 2)     Status: None   Collection Time    01/19/14  5:59 PM      Result Value Ref Range Status   Specimen Description BLOOD LEFT HAND   Final   Special Requests BOTTLES DRAWN AEROBIC AND ANAEROBIC 6CC   Final   Culture NO GROWTH 5 DAYS   Final   Report Status 01/24/2014 FINAL   Final     Labs: Basic Metabolic Panel:  Recent Labs Lab 01/18/14 1345 01/19/14 0545 01/21/14 0538 01/23/14 1858 01/24/14 0627  NA  --  139 142 141 143  K  --  3.5* 4.0 3.6* 3.8  CL  --  100 103 99 102  CO2  --  25 26 28 31   GLUCOSE  --  108* 116* 91 105*  BUN  --  7 11 7 9   CREATININE 0.73 0.65 0.71 0.76 0.82  CALCIUM  --  9.2 9.3 9.5 9.5   Liver Function Tests:  Recent Labs Lab 01/19/14 0545 01/21/14 0538  01/23/14 1858 01/24/14 0627  AST 15 19 13 11   ALT 13 9 7 7   ALKPHOS 142* 133* 110 95  BILITOT 0.8 0.4 0.5 0.5  PROT 7.0 7.2 7.8 6.9  ALBUMIN 3.5 3.3* 3.8 3.2*   No results found for this basename: LIPASE, AMYLASE,  in the last 168 hours No results found for this basename: AMMONIA,  in the last 168 hours CBC:  Recent Labs Lab 01/19/14 0545 01/20/14 0613 01/21/14 0538 01/23/14 1858 01/24/14 0627  WBC 7.6 7.1 6.6 6.3 5.7  NEUTROABS  --   --   --  4.3  --   HGB 12.7* 12.9* 12.4* 13.0 12.8*  HCT 38.0* 38.8* 37.3* 38.5* 39.0  MCV 96.7 97.0 96.4 97.0 98.5  PLT 274 258 287 397 370   Cardiac Enzymes: No results found for this basename: CKTOTAL, CKMB, CKMBINDEX, TROPONINI,  in the last 168 hours BNP: BNP (last 3 results) No results found for this basename: PROBNP,  in the last 8760 hours CBG: No results found for this basename: GLUCAP,  in the last 168 hours     Signed:  MEMON,JEHANZEB  [facility] 01/24/2014, 5:54 PM

## 2014-01-24 NOTE — Care Management Utilization Note (Signed)
UR completed 

## 2014-01-24 NOTE — Clinical Social Work Note (Signed)
Pt referred for SNF as PICC line was recommended for IV antibiotics. Pt has admitted to IV drug use. CSW explained need for SNF until antibiotics completed. Pt adamantly refuses to consider this option and states that he had told MD this as well. CSW tried to initiate conversation about substance use, but pt refused to discuss this as well. He states he is happy to come to hospital however many times he needs to in order to complete antibiotics, but will not go to nursing facility. CSW notified MD and CM and will sign off but can be reconsulted if he changes his mind.  Derenda FennelKara Samuell Knoble, [place]CS[place] [phone]

## 2014-01-25 ENCOUNTER — Encounter (HOSPITAL_COMMUNITY)
Admit: 2014-01-25 | Discharge: 2014-01-25 | Disposition: A | Discharge status: Home / Self Care | Payer: Self-pay | Attending: Internal Medicine | Admitting: Internal Medicine

## 2014-01-25 ENCOUNTER — Encounter (HOSPITAL_COMMUNITY)
Admit: 2014-01-25 | Discharge: 2014-01-25 | Disposition: A | Discharge status: Home / Self Care | Payer: Self-pay | Source: Ambulatory Visit | Attending: Internal Medicine | Admitting: Internal Medicine

## 2014-01-25 DIAGNOSIS — R7881 Bacteremia: Secondary | ICD-10-CM | POA: Insufficient documentation

## 2014-01-25 DIAGNOSIS — A4901 Methicillin susceptible Staphylococcus aureus infection, unspecified site: Secondary | ICD-10-CM | POA: Insufficient documentation

## 2014-01-25 MED ORDER — VANCOMYCIN HCL 10 G IV SOLR
1500.0000 mg | Freq: Once | INTRAVENOUS | Status: AC
  Administered 2014-01-25: 1500 mg via INTRAVENOUS
  Filled 2014-01-25: qty 1500

## 2014-01-25 MED ORDER — VANCOMYCIN HCL 10 G IV SOLR
1500.0000 mg | Freq: Two times a day (BID) | INTRAVENOUS | Status: DC
  Administered 2014-01-25: 1500 mg via INTRAVENOUS
  Filled 2014-01-25 (×3): qty 1500

## 2014-01-25 NOTE — Progress Notes (Signed)
Patient finished IV antibiotic at 2020. Vitals were stable and patient left with girlfriend after IV was removed.

## 2014-01-25 NOTE — Progress Notes (Addendum)
Pt arrived at facility with family to receive 1500mg  of vancomycin via intervenous infusion. Pt is alert and oriented upon arrival with no complaints other than lower back pain from previous injury. VS on arrival are BP: 125/87, HR: 82, Temp: 99.4, and SPO2: 98% on RA. IV accessed in pt's right anterior forearm. Pt tolerated access well and now has a #22G IV, good blood return was noted, and IV flushes well. Pt identified appropriately by name and DOB.Vancomycin infusion started.   1035: Pt dislodged IV while attempting to use the bathroom. I attempted to gain IV access and was unsuccessful times 1 attempt. Administrative supervisor was called to gain IV access.  1055: After two attempts, AC was able to gain a #22G IV in pt's left antecubital. Pt tolerated IV access well, restated  vancomycin infusion.   1120: IV infusion completed. IV removed with tip intact. Site clean and dry. VS at end of infusion are BP: 138/85, HR: 90, temp: 98.4, SPO2: 100 on RA. Pt tolerated infusion well and has no new complaints at this time. Pt still alert and oriented. He elected to ambulate outside and refused staff assistance in leaving.

## 2014-01-25 NOTE — Progress Notes (Signed)
Pt arrived to hospital to receive 1500mg  of IV vancomycin. Pt is alert and oriented. VS upon arrival are BP 124/81, HR 67, temp 98.1, SPO2 98% on RA. IV access obtained in left hand. Pt tolerated procedure well, he now has a #22G. Pt identified with name and date of birth. Vancomycin infusion begun. Pt has no complaints at this time.

## 2014-01-26 ENCOUNTER — Encounter (HOSPITAL_COMMUNITY)
Admit: 2014-01-26 | Discharge: 2014-01-26 | Disposition: A | Discharge status: Home / Self Care | Payer: Self-pay | Attending: Internal Medicine | Admitting: Internal Medicine

## 2014-01-26 ENCOUNTER — Encounter (HOSPITAL_COMMUNITY): Payer: Self-pay

## 2014-01-26 NOTE — Progress Notes (Addendum)
Pt came to unit to receive dose of 1500mg  of IV vancomycin. Pt is alert and oriented upon arrival with no complaints. VS are stable BP 136/86, HR 71, Temp 98.4, SPO2 98% on RA. [facility] with [facility] #22G needle. Pt tolerated IV access well. Vancomycin infusion began at 0735. Will continue to monitor.   0940: Infusion complete. Pt has no new complaints. VS are stable; BP 144/96, HR 71, temp 97.9, SPO2 100% on RA. IV removed, pt tolerated removal well. Site is clean and dry. Pt elected to ambulate outside of the hospital without staff assistance. Pt alert and oriented.

## 2014-01-27 ENCOUNTER — Encounter (HOSPITAL_COMMUNITY)
Admit: 2014-01-27 | Discharge: 2014-01-27 | Disposition: A | Discharge status: Home / Self Care | Payer: Self-pay | Attending: Internal Medicine | Admitting: Internal Medicine

## 2014-01-27 MED ORDER — VANCOMYCIN HCL 10 G IV SOLR
1500.0000 mg | Freq: Two times a day (BID) | INTRAVENOUS | Status: DC
  Administered 2014-01-27 (×2): 1500 mg via INTRAVENOUS
  Filled 2014-01-27 (×2): qty 1500

## 2014-01-27 MED ORDER — VANCOMYCIN HCL 10 G IV SOLR
1500.0000 mg | Freq: Two times a day (BID) | INTRAVENOUS | Status: DC
  Filled 2014-01-27 (×2): qty 1500

## 2014-01-27 MED ORDER — SODIUM CHLORIDE 0.9 % IV SOLN
Freq: Once | INTRAVENOUS | Status: AC
  Administered 2014-01-27: 08:00:00 via INTRAVENOUS

## 2014-01-28 ENCOUNTER — Encounter (HOSPITAL_COMMUNITY)
Admit: 2014-01-28 | Discharge: 2014-01-28 | Disposition: A | Discharge status: Home / Self Care | Payer: Self-pay | Attending: Internal Medicine | Admitting: Internal Medicine

## 2014-01-28 LAB — VANCOMYCIN, TROUGH: VANCOMYCIN TR: 10.7 ug/mL (ref 10.0–20.0)

## 2014-01-28 MED ORDER — VANCOMYCIN HCL 10 G IV SOLR
1500.0000 mg | Freq: Two times a day (BID) | INTRAVENOUS | Status: DC
  Administered 2014-01-28: 1500 mg via INTRAVENOUS
  Filled 2014-01-28: qty 1500

## 2014-01-28 MED ORDER — SODIUM CHLORIDE 0.9 % IV SOLN: INTRAVENOUS | Status: DC

## 2014-01-28 NOTE — Progress Notes (Addendum)
Pt completed am dose vancomycin. Questioning need of urinary catheter. States he has no money to see urologist as ordered. Instructed to return to ED to ask if catheter can be removed.  Vancomycin trough results faxed to Dr Kerry HoughMemon.

## 2014-01-29 ENCOUNTER — Encounter (HOSPITAL_COMMUNITY)
Admit: 2014-01-29 | Discharge: 2014-01-29 | Disposition: A | Discharge status: Home / Self Care | Payer: Self-pay | Attending: Internal Medicine | Admitting: Internal Medicine

## 2014-01-29 MED ORDER — VANCOMYCIN HCL 10 G IV SOLR
1500.0000 mg | Freq: Once | INTRAVENOUS | Status: DC
  Administered 2014-01-29: 1500 mg via INTRAVENOUS
  Filled 2014-01-29: qty 1500

## 2014-01-29 MED ORDER — VANCOMYCIN HCL 500 MG IV SOLR
250.0000 mg | Freq: Once | INTRAVENOUS | Status: DC
  Filled 2014-01-29: qty 250

## 2014-01-29 MED ORDER — SODIUM CHLORIDE 0.9 % IV SOLN
INTRAVENOUS | Status: DC
  Administered 2014-01-29: 07:00:00 via INTRAVENOUS

## 2014-01-29 MED ORDER — VANCOMYCIN HCL 10 G IV SOLR
1500.0000 mg | Freq: Once | INTRAVENOUS | Status: DC
  Filled 2014-01-29: qty 1500

## 2014-01-29 MED ORDER — VANCOMYCIN HCL 10 G IV SOLR
1750.0000 mg | Freq: Two times a day (BID) | INTRAVENOUS | Status: DC
  Filled 2014-01-29 (×2): qty 1750

## 2014-01-29 MED FILL — Vancomycin HCl For IV Soln 10 GM (Base Equivalent): INTRAVENOUS | Qty: 1500 | Status: CN

## 2014-01-29 MED FILL — Vancomycin HCl For IV Soln 10 GM (Base Equivalent): INTRAVENOUS | Qty: 250 | Status: AC

## 2014-01-29 NOTE — Progress Notes (Signed)
ANTIBIOTIC CONSULT NOTE - FOLLOW UP  Pharmacy Consult for Vancomycin Indication: Bacteremia  No Known Allergies  Patient Measurements:   Adjusted Body Weight: 86Kg  Vital Signs:   Intake/Output from previous day:   Intake/Output from this shift:    Labs: No results found for this basename: WBC, HGB, PLT, LABCREA, CREATININE,  in the last 72 hours The CrCl is unknown because both a height and weight (above a minimum accepted value) are required for this calculation.  Recent Labs  01/28/14 0725  VANCOTROUGH 10.7    Microbiology: Recent Results (from the past 720 hour(s))  CULTURE, BLOOD (ROUTINE X 2)     Status: None   Collection Time    01/16/14  5:33 PM      Result Value Ref Range Status   Specimen Description BLOOD RIGHT ARM   Final   Special Requests BOTTLES DRAWN AEROBIC AND ANAEROBIC [facility]   Final   Culture  Setup Time     Final   Value: 01/17/2014 20:08     Performed at [facility]   Culture     Final   Value: STAPHYLOCOCCUS AUREUS     Note: RIFAMPIN AND GENTAMICIN SHOULD NOT BE USED AS SINGLE DRUGS FOR TREATMENT OF STAPH INFECTIONS. This organism is presumed to be Clindamycin resistant based on detection of inducible Clindamycin resistance.     Note: Gram Stain Report Called to,Read Back By and Verified With: L MILLER AT 1410 ON 01/17/14 BY S VANHOORNE     Performed at [facility]   Report Status 01/19/2014 FINAL   Final   Organism ID, Bacteria STAPHYLOCOCCUS AUREUS   Final  CULTURE, BLOOD (ROUTINE X 2)     Status: None   Collection Time    01/16/14  5:38 PM      Result Value Ref Range Status   Specimen Description BLOOD LEFT ARM   Final   Special Requests BOTTLES DRAWN AEROBIC AND ANAEROBIC [facility]   Final   Culture  Setup Time     Final   Value: 01/17/2014 15:17     Performed at [facility]   Culture     Final   Value: STAPHYLOCOCCUS AUREUS     Note: SUSCEPTIBILITIES PERFORMED ON PREVIOUS CULTURE WITHIN THE LAST 5 DAYS.     Note:  CRITICAL RESULT CALLED TO, READ BACK BY AND VERIFIED WITH: SHANNON GAMMONS 01/18/14 @ 9:48AM BY RUSCOE A.     Performed at [facility]   Report Status 01/19/2014 FINAL   Final  CULTURE, BLOOD (ROUTINE X 2)     Status: None   Collection Time    01/19/14  5:51 PM      Result Value Ref Range Status   Specimen Description BLOOD LEFT ANTECUBITAL   Final   Special Requests BOTTLES DRAWN AEROBIC AND ANAEROBIC 7CC   Final   Culture NO GROWTH 5 DAYS   Final   Report Status 01/24/2014 FINAL   Final  CULTURE, BLOOD (ROUTINE X 2)     Status: None   Collection Time    01/19/14  5:59 PM      Result Value Ref Range Status   Specimen Description BLOOD LEFT HAND   Final   Special Requests BOTTLES DRAWN AEROBIC AND ANAEROBIC 6CC   Final   Culture NO GROWTH 5 DAYS   Final   Report Status 01/24/2014 FINAL   Final    Anti-infectives   Start     Dose/Rate Route Frequency Ordered Stop  01/29/14 1800  vancomycin (VANCOCIN) 1,750 mg in sodium chloride 0.9 % 500 mL IVPB     1,750 mg 250 mL/hr over 120 Minutes Intravenous Every 12 hours 01/29/14 0748 02/02/14 0559   01/29/14 0900  vancomycin (VANCOCIN) 250 mg in sodium chloride 0.9 % 100 mL IVPB     250 mg 100 mL/hr over 60 Minutes Intravenous  Once 01/29/14 0747     01/29/14 0745  vancomycin (VANCOCIN) 1,500 mg in sodium chloride 0.9 % 500 mL IVPB  Status:  Discontinued     1,500 mg 250 mL/hr over 120 Minutes Intravenous  Once 01/29/14 0737 01/29/14 0744     Assessment: [name] [name] being treated as outpatient with Vancomycin for bacteremia.  Trough level is below target.  Pt has good renal fxn and excellent Vancomycin clearance  Goal of Therapy:  Vancomycin trough level 15-20 mcg/ml Eradicate infection.  Plan:  Increase Vancomycin to 1750mg  IV q12hrs starting today (Vancomycin scheduled to end on 8/22 and no additional levels needed)  Margo AyeHall, Alaja Goldinger A 01/29/2014,7:49 AM

## 2014-01-30 ENCOUNTER — Encounter (HOSPITAL_COMMUNITY)
Admit: 2014-01-30 | Discharge: 2014-01-30 | Disposition: A | Discharge status: Home / Self Care | Payer: Self-pay | Attending: Internal Medicine | Admitting: Internal Medicine

## 2014-01-30 MED ORDER — VANCOMYCIN HCL 10 G IV SOLR
1750.0000 mg | Freq: Once | INTRAVENOUS | Status: AC
  Administered 2014-01-30: 1750 mg via INTRAVENOUS
  Filled 2014-01-30 (×3): qty 1750

## 2014-01-30 MED FILL — Vancomycin HCl For IV Soln 10 GM (Base Equivalent): INTRAVENOUS | Qty: 1750 | Status: AC

## 2014-01-30 NOTE — Progress Notes (Signed)
Consulted Dr. Irene LimboGoodrich regarding continuation/necessity of urinary catheter. Order received to discontinue foley. If pt is unable to void on his own then [facility] or be [facility] when he comes back for his infusion this evening. Pt verbalized understanding. Pt voided 300 cc of pink tinged urine without difficulty but with a burning sensation. Pt called 2 hours after discharge and reports no difficulty but burning with urination.

## 2014-01-31 ENCOUNTER — Encounter (HOSPITAL_COMMUNITY)
Admit: 2014-01-31 | Discharge: 2014-01-31 | Disposition: A | Discharge status: Home / Self Care | Payer: Self-pay | Attending: Internal Medicine | Admitting: Internal Medicine

## 2014-01-31 ENCOUNTER — Emergency Department (HOSPITAL_COMMUNITY)
Admission: EM | Admit: 2014-01-31 | Discharge: 2014-01-31 | Disposition: A | Discharge status: Home / Self Care | Payer: MEDICAID | Attending: Emergency Medicine | Admitting: Emergency Medicine

## 2014-01-31 ENCOUNTER — Encounter (HOSPITAL_COMMUNITY): Payer: Self-pay | Admitting: Emergency Medicine

## 2014-01-31 DIAGNOSIS — M5430 Sciatica, unspecified side: Secondary | ICD-10-CM | POA: Insufficient documentation

## 2014-01-31 DIAGNOSIS — Z7982 Long term (current) use of aspirin: Secondary | ICD-10-CM | POA: Insufficient documentation

## 2014-01-31 DIAGNOSIS — Z792 Long term (current) use of antibiotics: Secondary | ICD-10-CM | POA: Insufficient documentation

## 2014-01-31 DIAGNOSIS — M545 Low back pain, unspecified: Secondary | ICD-10-CM | POA: Insufficient documentation

## 2014-01-31 DIAGNOSIS — F17200 Nicotine dependence, unspecified, uncomplicated: Secondary | ICD-10-CM | POA: Insufficient documentation

## 2014-01-31 DIAGNOSIS — M5441 Lumbago with sciatica, right side: Secondary | ICD-10-CM

## 2014-01-31 DIAGNOSIS — Z79899 Other long term (current) drug therapy: Secondary | ICD-10-CM | POA: Insufficient documentation

## 2014-01-31 DIAGNOSIS — I10 Essential (primary) hypertension: Secondary | ICD-10-CM | POA: Insufficient documentation

## 2014-01-31 DIAGNOSIS — R000 Tachycardia, unspecified: Secondary | ICD-10-CM | POA: Insufficient documentation

## 2014-01-31 DIAGNOSIS — F411 Generalized anxiety disorder: Secondary | ICD-10-CM | POA: Insufficient documentation

## 2014-01-31 DIAGNOSIS — Z791 Long term (current) use of non-steroidal anti-inflammatories (NSAID): Secondary | ICD-10-CM | POA: Insufficient documentation

## 2014-01-31 MED ORDER — VANCOMYCIN HCL 10 G IV SOLR
1750.0000 mg | Freq: Once | INTRAVENOUS | Status: AC
  Administered 2014-01-31: 1750 mg via INTRAVENOUS
  Filled 2014-01-31: qty 1750

## 2014-01-31 MED ORDER — OXYCODONE-ACETAMINOPHEN 5-325 MG PO TABS: 1.0000 | ORAL_TABLET | ORAL | Status: DC | PRN

## 2014-01-31 MED ORDER — ONDANSETRON 8 MG PO TBDP
8.0000 mg | ORAL_TABLET | Freq: Once | ORAL | Status: AC
  Administered 2014-01-31: 8 mg via ORAL
  Filled 2014-01-31: qty 1

## 2014-01-31 MED ORDER — OXYCODONE-ACETAMINOPHEN 5-325 MG PO TABS
2.0000 | ORAL_TABLET | Freq: Once | ORAL | Status: AC
  Administered 2014-01-31: 2 via ORAL
  Filled 2014-01-31: qty 2

## 2014-01-31 NOTE — ED Notes (Signed)
Hx of chronic back pain - picked up son yesterday and had worsening lower back pain since.

## 2014-01-31 NOTE — Discharge Instructions (Signed)
Back Pain, Adult °Back pain is very common. The pain often gets better over time. The cause of back pain is usually not dangerous. Most people can learn to manage their back pain on their own.  °HOME CARE  °· Stay active. Start with short walks on flat ground if you can. Try to walk farther each day. °· Do not sit, drive, or stand in one place for more than 30 minutes. Do not stay in bed. °· Do not avoid exercise or work. Activity can help your back heal faster. °· Be careful when you bend or lift an object. Bend at your knees, keep the object close to you, and do not twist. °· Sleep on a firm mattress. Lie on your side, and bend your knees. If you lie on your back, put a pillow under your knees. °· Only take medicines as told by your doctor. °· Put ice on the injured area. °¨ Put ice in a plastic bag. °¨ Place a towel between your skin and the bag. °¨ Leave the ice on for 15-20 minutes, 03-04 times a day for the first 2 to 3 days. After that, you can switch between ice and heat packs. °· Ask your doctor about back exercises or massage. °· Avoid feeling anxious or stressed. Find good ways to deal with stress, such as exercise. °GET HELP RIGHT AWAY IF:  °· Your pain does not go away with rest or medicine. °· Your pain does not go away in 1 week. °· You have new problems. °· You do not feel well. °· The pain spreads into your legs. °· You cannot control when you poop (bowel movement) or pee (urinate). °· Your arms or legs feel weak or lose feeling (numbness). °· You feel sick to your stomach (nauseous) or throw up (vomit). °· You have belly (abdominal) pain. °· You feel like you may pass out (faint). °MAKE SURE YOU:  °· Understand these instructions. °· Will watch your condition. °· Will get help right away if you are not doing well or get worse. °Document Released: 11/16/2007 Document Revised: 08/22/2011 Document Reviewed: 10/01/2013 °ExitCare® Patient Information ©2015 [employer]. This information is not intended  to replace advice given to you by your health care provider. Make sure you discuss any questions you have with your health care provider. ° ° °Emergency Department Resource Guide °1) Find a Doctor and Pay Out of Pocket °Although you won't have to find out who is covered by your insurance plan, it is a good idea to ask around and get recommendations. You will then need to call the office and see if the doctor you have chosen will accept you as a new patient and what types of options they offer for patients who are self-pay. Some doctors offer discounts or will set up payment plans for their patients who do not have insurance, but you will need to ask so you aren't surprised when you get to your appointment. ° °2) Contact Your Local Health Department °Not all health departments have doctors that can see patients for sick visits, but many do, so it is worth a call to see if yours does. If you don't know where your local health department is, you can check in your phone book. The CDC also has a tool to help you locate your state's health department, and many state websites also have listings of all of their local health departments. ° °3) Find a Walk-in Clinic °If your illness is not likely to be   very severe or complicated, you may want to try a walk in clinic. These are popping up all over the country in pharmacies, drugstores, and shopping centers. They're usually staffed by nurse practitioners or physician assistants that have been trained to treat common illnesses and complaints. They're usually fairly quick and inexpensive. However, if you have serious medical issues or chronic medical problems, these are probably not your best option. ° °No Primary Care Doctor: °- Call [facility] at  832-8000 - they can help you locate a primary care doctor that  accepts your insurance, provides certain services, etc. °- Physician Referral Service- 1-800-533-3463 ° °Chronic Pain Problems: °Organization         Address  Phone    Notes  °[facility]  (336) 297-2271 Patients need to be referred by their primary care doctor.  ° °Medication Assistance: °Organization         Address  Phone   Notes  °[place] [facility] [address] °[place], [place] [address] (336) 641-8030 --Must be a resident of [place] °-- Must have NO insurance coverage whatsoever (no Medicaid/ Medicare, etc.) °-- The pt. MUST have a primary care doctor that directs their care regularly and follows them in the community °  °[facility]  (866) 331-1348   °[employer]  (888) 892-1162   ° °Agencies that provide inexpensive medical care: °Organization         Address  Phone   Notes  °[facility]  (336) 832-8035   °[facility]    (336) 832-7272   °[facility] [address] °[place], [place] [address] (336) 832-4777   °[facility] [address], °[place] (336) 271-4999   °[facility]    (336) 373-0678   °[facility]    (336) 272-1050   °[facility] ° [address], [place] Phone:  (336) 832-4444, Fax:  (336) 832-4440 Hours of Operation:  9 am - 6 pm, M-F.  Also accepts Medicaid/Medicare and self-pay.  °[facility] ° [address], [place] Phone: (336) 832-3150, Fax: (336) 832-3151. Hours of Operation:  8:30 am - 5:30 pm, M-F.  Also accepts Medicaid and self-pay.  °[facility] [place] [address], [place] Phone: (336) 878-6027   °[facility] [address], [place], [place] (336)723-1848, Ext. 123 Mondays & Thursdays: 7-9 AM.  First 15 patients are seen on a first come, first serve basis. °  ° °Medicaid-accepting [place] Providers: ° °Organization         Address  Phone   Notes  °[facility] [address], [place] (336) 641-2100 Also accepts self-pay patients.  °[facility]  [address], [place] ° (336) 856-9996   °[facility] [address], [place] (336) 288-8857   °[facility] [address] [place] [address], [place] (336) 299-7000   °Veita Bland [address], [place]  ° (336) 373-1557 Only accepts [place] Access Medicaid patients after they have their name applied to their card.  ° °Self-Pay (no insurance) in [place]: ° °Organization         Address  Phone   Notes  °Sickle Cell Patients, [facility] [address],  (336) 832-1970   °[facility]   [address], [place] (336) 832-4400   °[facility] ° [address], [place] (336) 992-4800   °[facility]/Dr. Osei-Bonsu ° [address] [place] [address], [place] or [address], [place] (336) 841-8500 Phone number for both [place] and [place] locations is the same.  °[facility] [address], [place] (336) 299-0000   °[facility] [address] [place] [address], [place] or [address] (336) 852-7530 °(336) 878-2260   °[facility] [address], [place] (336) 350-1642, phone; (336) 294-5005, fax Sees patients 1st and 3rd Saturday of every month.  Must not qualify for public or private insurance (i.e. Medicaid, Medicare, [place] Health Choice, Veterans' Benefits) • Household income should be no more than 200% of the poverty level •The clinic cannot treat you if you are pregnant or think you are pregnant • Sexually transmitted diseases are not treated at the clinic.  ° ° °Dental Care: °Organization         Address  Phone  Notes  °[facility] [address], [place] (336) 641-6152 Accepts children up to age 21 who are enrolled in Medicaid or [place] Health Choice; pregnant women with a Medicaid card; and children who have  applied for Medicaid or [place] Health Choice, but were declined, whose parents can pay a reduced fee at time of service.  °[facility] [place]  [address], [place] (336) 641-7733 Accepts children up to age 21 who are enrolled in Medicaid or [place] Health Choice; pregnant women with a Medicaid card; and children who have applied for Medicaid or [place] Health Choice, but were declined, whose parents can pay a reduced fee at time of service.  °[facility] ° [address], [place] (336) 641-4533 Patients are seen by appointment only. Walk-ins are not accepted. [facility] will see patients 18 years of age and older. °Monday - Tuesday (8am-5pm) °Most Wednesdays (8:30-5pm) °$30 per visit, cash only  °[facility] ° [address], [place] (336) 641-4533 Patients are seen by appointment only. Walk-ins are not accepted. [facility] will see patients 18 years of age and older. °One Wednesday Evening (Monthly: Volunteer Based).  $30 per visit, cash only  °[facility]  (919) 537-3737 for adults; Children under age 4, call Graduate Pediatric Dentistry at (919) [phone]. Children aged 4-14, please call (919) [phone] to request a pediatric application. ° Dental services are provided in all areas of dental care including fillings, crowns and bridges, complete and partial dentures, implants, gum treatment, root canals, and extractions. Preventive care is also provided. Treatment is provided to both adults and children. °Patients are selected via a lottery and there is often a waiting list. °  °[facility] [address], °[place] ° (336) 763-8833 [email or web address] °  °[facility] [address], [place], [place] (336)723-1848, Ext. 123 Second and Fourth Thursday of each month, opens at 6:30 AM; Clinic ends at 9 AM.  Patients are seen on a first-come first-served basis, and a  limited number are seen during each clinic.  ° °[facility] ° [address], [place], [place] (336) 723-7904   Eligibility Requirements °You must have lived in [place], [place], or [place] counties for at least the last three months. °    You cannot be eligible for state or federal sponsored healthcare insurance, including Veterans Administration, Medicaid, or Medicare. °  You generally cannot be eligible for healthcare insurance through your employer.  °  How to apply: °Eligibility screenings are held every Tuesday and Wednesday afternoon from 1:00 pm until 4:00 pm. You do not need an appointment for the interview!  °[facility] [address], [place], [place] 336-631-2330   °[facility]  336-342-8273   °[facility]  336-703-3100   °[facility]  336-570-6415   ° °Behavioral Health Resources in the Community: °Intensive Outpatient Programs °Organization         Address  Phone  Notes  °[facility] [address], [place], [place] 336-878-6098   °[facility] [address], [place], [place] 336-832-9800   °[facility] [address], [place], [place] ° 336-882-2125   °[facility] [address],  °[place], [place] 1-800-853-5163 or 336-641-4981   °Substance Abuse Resources °Organization         Address  Phone  Notes  °[facility]  336-882-2125   °[facility]  336-784-9470   °[facility]  336-285-9073   °[facility]  336-845-3988   °Residential & Outpatient Substance Abuse Program  1-800-659-3381   °Psychological Services °Organization         Address  Phone  Notes  °[facility]  336- 832-9600   °[facility]  336- 378-7881   °[facility] [address], [place] 1-800-853-5163 or 336-641-4981   ° °Mobile Crisis Teams °Organization          Address  Phone  Notes  °[facility], Mobile Crisis Care Unit  1-877-626-1772   °Assertive °[facility] ° [address] [place], [place] 336-834-9664   °Sharon DeEsch [address] °[place] 336-554-5454   ° °Self-Help/Support Groups °Organization         Address  Phone             Notes  °[facility] - variety of support groups  336- 373-1402 Call for more information  °Narcotics Anonymous (NA), [facility] [address], °[place]  2 meetings at this location  ° °Residential Treatment Programs °Organization         Address  Phone  Notes  °[facility] [address],    °[place]  1-866-801-8205   °[facility] ° [address], [place], [place] 704-293-8524   °[facility] [address], [place] 336-845-3988 Admissions: 8am-3pm M-F  °[facility] [address]    °[place], [place] 336-841-1104   °[facility] [address], [place], [place] 336-379-7146   °[facility] [address]  °[place], [place] 336-285-9073   °[facility] Intensive Outpatient [address], [place], [place] 336-852-3033   °[facility] [address]  °[place], [place] 1-877-615-2722 or 336-784-9470   °[facility] [address] [place],  336-227-7417 Accepts Medicaid  °[facility] [address]  °  1-800-659-3381 Substance Abuse/Addiction Treatment  ° °[place] Behavioral Health Resources °Organization         Address  Phone  Notes  °[facility]  (888) 581-9988   °Julie Brannon, PhD [address] [place], [place]   (336) 349-5553 or (336) 951-0000   °[facility]   [address] °[place], [place] (336) 349-4454   °[facility] [address], [place], [place] (336) 342-8316 Insurance/Medicaid/sponsorship  through [facility]  °[facility] [address]                                    [place], [place] (336) 342-8316 Therapy/tele-psych/case  °[facility] [address]  ° [place], [place] (336) 349-2233    °Dr. Arfeen  (336) 349-4544   °[facility] [place]  [employer] [place] [facility] 1) [address], [place] °2) [address], [place] °3)  [address], [place] (336) 349-3220 °(336) 342-7768 ° °(336) 342-8140   °[place] [facility] (336) 342-1394 or (336) 342-3537 (After Hours)    ° ° ° ° °

## 2014-01-31 NOTE — ED Provider Notes (Signed)
CSN: [id number]     Arrival date & time 01/31/14  1023 History   First MD Initiated Contact with Patient 01/31/14 1053     Chief Complaint  Patient presents with  . Back Pain     (Consider location/radiation/quality/duration/timing/severity/associated sxs/prior Treatment) HPI Comments: Patient is a 38 year old male history of hypertension and anxiety who presents to the emergency department today with worsening low back pain. He reports that he picked up his son yesterday and immediately felt a significant amount of pain. The pain radiates down his right leg. He describes the pain as constant. Walking makes his pain worse. He has run out of his home pain medicines. He had a recent admission for bacteremia. He initially came in with back pain and urinary retention. An MRI was done which shows small disc protrusion which contacts the left L5 nerve root without evidence of significant mass effect.  No cauda equina, discitis, epidural abscess. during this admission a Foley catheter was placed for his urinary retention. The catheter was removed yesterday. He has been receiving doses of vancomycin twice daily since he's been released from the hospital. He has been compliant with this. He has history of IV drug abuse. He denies any current urinary retention, bowel or bladder incontinence. He is not currently using IV drugs. No fevers. He otherwise feels well.  Patient is a 38 y.o. male presenting with back pain. The history is provided by the patient. No language interpreter was used.  Back Pain Associated symptoms: no abdominal pain, no chest pain, no dysuria and no fever     Past Medical History  Diagnosis Date  . Hypertension   . Anxiety    Past Surgical History  Procedure Laterality Date  . Tee without cardioversion N/A 01/21/2014    Procedure: TRANSESOPHAGEAL ECHOCARDIOGRAM (TEE) with propofol;  Surgeon: Antoine Poche, MD;  Location: [facility];  Service: Endoscopy;  Laterality: N/A;   No  family history on file. History  Substance Use Topics  . Smoking status: Current Every Day Smoker -- 0.50 packs/day    Types: Cigarettes  . Smokeless tobacco: Not on file  . Alcohol Use: Yes     Comment: occ    Review of Systems  Constitutional: Negative for fever and chills.  Respiratory: Negative for shortness of breath.   Cardiovascular: Negative for chest pain.  Gastrointestinal: Negative for nausea, vomiting and abdominal pain.  Genitourinary: Negative for dysuria and frequency.  Musculoskeletal: Positive for back pain and gait problem.  All other systems reviewed and are negative.     Allergies  Review of patient's allergies indicates no known allergies.  Home Medications   Prior to Admission medications   Medication Sig Start Date End Date Taking? Authorizing Provider  ALPRAZolam Prudy Feeler) 1 MG tablet Take 1 mg by mouth 3 (three) times daily.    Yes Historical Provider, MD  aspirin EC 81 MG tablet Take 81 mg by mouth daily.   Yes Historical Provider, MD  ibuprofen (ADVIL,MOTRIN) 800 MG tablet Take 400-800 mg by mouth every 8 (eight) hours as needed.   Yes Historical Provider, MD  oxyCODONE-acetaminophen (PERCOCET/ROXICET) 5-325 MG per tablet Take 1-2 tablets by mouth every 6 (six) hours as needed. 01/24/14  Yes Erick Blinks, MD  tamsulosin (FLOMAX) 0.4 MG CAPS capsule Take 1 capsule (0.4 mg total) by mouth daily after supper. 01/24/14  Yes Erick Blinks, MD  vancomycin 1,500 mg in sodium chloride 0.9 % 250 mL Inject 1,500 mg into the vein every 12 (twelve) hours.  Until 02/02/14 01/24/14  Yes Erick BlinksJehanzeb Memon, MD   BP 166/106  Pulse 109  Temp(Src) 97.7 F (36.5 C) (Oral)  Resp 16  Ht 6' (1.829 m)  Wt 180 lb (81.647 kg)  BMI 24.41 kg/m2  SpO2 100% Physical Exam  Nursing note and vitals reviewed. Constitutional: He is oriented to person, place, and time. He appears well-developed and well-nourished. He appears distressed.  Patient in fetal position on the bed. This is  position of comfort  HENT:  Head: Normocephalic and atraumatic.  Right Ear: External ear normal.  Left Ear: External ear normal.  Nose: Nose normal.  Eyes: Conjunctivae are normal.  Neck: Normal range of motion. No tracheal deviation present.  Cardiovascular: Regular rhythm, normal heart sounds, intact distal pulses and normal pulses.  Tachycardia present.   Pulses:      Radial pulses are 2+ on the right side, and 2+ on the left side.       Posterior tibial pulses are 2+ on the right side, and 2+ on the left side.  Pulmonary/Chest: Effort normal and breath sounds normal. No stridor.  Abdominal: Soft. He exhibits no distension. There is no tenderness.  Musculoskeletal: Normal range of motion.       Back:  Tenderness to palpation of her lumbar spine. No deformities or step-offs.  Neurological: He is alert and oriented to person, place, and time.  Skin: Skin is warm and dry. He is not diaphoretic.  Psychiatric: He has a normal mood and affect. His behavior is normal.    ED Course  Procedures (including critical care time) Labs Review Labs Reviewed - No data to display  Imaging Review No results found.   EKG Interpretation None      MDM   Final diagnoses:  Midline low back pain with right-sided sciatica    Patient is to emergency department for evaluation of low back pain which began when he was lifting up his sinus. Patient is currently on vancomycin twice daily for which he comes to the hospital to receive a given history of IV drug abuse. He has had a recent MRI which shows still a word-L5 disc protrusion which contacts the left L5 nerve root without evidence of significant mass effect. Currently no bowel or bladder incontinence, no fevers. He does not use IV drugs currently. I do not believe the patient has a discitis, epidural abscess, cauda equina. Patient is out of his narcotic pain medication. He has a follow up appoint with a primary care physician next Friday. I will  give patient a small course of Percocet. Discussed reasons to come back to emergency department immediately. Vital signs stable for discharge. Discussed case with Dr. Estell HarpinZammit who agrees with plan. Patient / Family / Caregiver informed of clinical course, understand medical decision-making process, and agree with plan.    Mora BellmanHannah S Ocie Tino, PA-C 01/31/14 [phone]

## 2014-02-01 ENCOUNTER — Encounter (HOSPITAL_COMMUNITY): Payer: Self-pay

## 2014-02-03 NOTE — ED Provider Notes (Signed)
Medical screening examination/treatment/procedure(s) were performed by non-physician practitioner and as supervising physician I was immediately available for consultation/collaboration.   EKG Interpretation None        Gloriajean Okun L Enrica Corliss, MD 02/03/14 1703 

## 2014-02-11 MED FILL — Vancomycin HCl For IV Soln 10 GM (Base Equivalent): INTRAVENOUS | Qty: 1750 | Status: AC

## 2014-02-11 MED FILL — Vancomycin HCl For IV Soln 10 GM (Base Equivalent): INTRAVENOUS | Qty: 1500 | Status: AC

## 2014-02-13 ENCOUNTER — Emergency Department (HOSPITAL_COMMUNITY)
Admission: EM | Admit: 2014-02-13 | Discharge: 2014-02-13 | Disposition: A | Discharge status: Home / Self Care | Payer: MEDICAID | Attending: Emergency Medicine | Admitting: Emergency Medicine

## 2014-02-13 ENCOUNTER — Encounter (HOSPITAL_COMMUNITY): Payer: Self-pay | Admitting: Emergency Medicine

## 2014-02-13 DIAGNOSIS — F17200 Nicotine dependence, unspecified, uncomplicated: Secondary | ICD-10-CM | POA: Insufficient documentation

## 2014-02-13 DIAGNOSIS — IMO0002 Reserved for concepts with insufficient information to code with codable children: Secondary | ICD-10-CM | POA: Insufficient documentation

## 2014-02-13 DIAGNOSIS — M545 Low back pain, unspecified: Secondary | ICD-10-CM | POA: Insufficient documentation

## 2014-02-13 DIAGNOSIS — M5416 Radiculopathy, lumbar region: Secondary | ICD-10-CM

## 2014-02-13 DIAGNOSIS — I10 Essential (primary) hypertension: Secondary | ICD-10-CM | POA: Insufficient documentation

## 2014-02-13 DIAGNOSIS — Z79899 Other long term (current) drug therapy: Secondary | ICD-10-CM | POA: Insufficient documentation

## 2014-02-13 DIAGNOSIS — F411 Generalized anxiety disorder: Secondary | ICD-10-CM | POA: Insufficient documentation

## 2014-02-13 DIAGNOSIS — Z7982 Long term (current) use of aspirin: Secondary | ICD-10-CM | POA: Insufficient documentation

## 2014-02-13 MED ORDER — OXYCODONE-ACETAMINOPHEN 5-325 MG PO TABS
2.0000 | ORAL_TABLET | Freq: Once | ORAL | Status: AC
  Administered 2014-02-13: 2 via ORAL
  Filled 2014-02-13: qty 2

## 2014-02-13 MED ORDER — OXYCODONE-ACETAMINOPHEN 5-325 MG PO TABS: 1.0000 | ORAL_TABLET | ORAL | Status: DC | PRN

## 2014-02-13 MED ORDER — CYCLOBENZAPRINE HCL 10 MG PO TABS: 10.0000 mg | ORAL_TABLET | Freq: Three times a day (TID) | ORAL | Status: DC | PRN

## 2014-02-13 MED ORDER — CYCLOBENZAPRINE HCL 10 MG PO TABS
10.0000 mg | ORAL_TABLET | Freq: Once | ORAL | Status: AC
  Administered 2014-02-13: 10 mg via ORAL
  Filled 2014-02-13: qty 1

## 2014-02-13 NOTE — Discharge Instructions (Signed)

## 2014-02-13 NOTE — ED Notes (Signed)
C/o chronic back pain from herniated disc. Reports pain is worse than normal and PCP called and had to reschedule appt until Sept 11th

## 2014-02-14 NOTE — ED Provider Notes (Signed)
CSN: [id number]     Arrival date & time 02/13/14  1454 History   First MD Initiated Contact with Patient 02/13/14 1522     Chief Complaint  Patient presents with  . Back Pain   [name] [name] is a 38 y.o. male with h/o previous IV drug use,who presents to the Emergency Department complaining of persistent low back pain and pain radiating into the right leg.  He was seen here in August initially for back pain and later found to have fevers and positive blood cultures and was admitted for MSSA bacteremia.  MRI of his lumbar spine did not show evidence of diskitis or abscess. Cardiac echos did not show evidence of endocarditis. He completed outpatient IV antibiotic therapy on 02/01/14 and has upcoming appt with urology for possible bladder outlet obstruction.  He also states that he has appt with PMD scheduled for sept 11 and states the pain to his back is chronic.  He denies any fever, chills, vomiting, incontinence of bladder or bowel, dysuria, numbness or weakness of his LE's.    (Consider location/radiation/quality/duration/timing/severity/associated sxs/prior Treatment) HPI  Past Medical History  Diagnosis Date  . Hypertension   . Anxiety    Past Surgical History  Procedure Laterality Date  . Tee without cardioversion N/A 01/21/2014    Procedure: TRANSESOPHAGEAL ECHOCARDIOGRAM (TEE) with propofol;  Surgeon: Antoine Poche, MD;  Location: [facility];  Service: Endoscopy;  Laterality: N/A;   No family history on file. History  Substance Use Topics  . Smoking status: Current Every Day Smoker -- 0.50 packs/day    Types: Cigarettes  . Smokeless tobacco: Not on file  . Alcohol Use: Yes     Comment: occ    Review of Systems  Constitutional: Negative for fever, chills, activity change and appetite change.  Respiratory: Negative for shortness of breath.   Gastrointestinal: Negative for vomiting, abdominal pain and constipation.  Genitourinary: Negative for dysuria, hematuria, flank pain,  decreased urine volume and difficulty urinating.       No perineal numbness or incontinence of urine or feces  Musculoskeletal: Positive for back pain. Negative for joint swelling, neck pain and neck stiffness.  Skin: Negative for color change, rash and wound.  Neurological: Negative for dizziness, weakness and numbness.  All other systems reviewed and are negative.     Allergies  Review of patient's allergies indicates no known allergies.  Home Medications   Prior to Admission medications   Medication Sig Start Date End Date Taking? Authorizing Provider  ALPRAZolam Prudy Feeler) 1 MG tablet Take 1 mg by mouth 3 (three) times daily.    Yes Historical Provider, MD  aspirin EC 81 MG tablet Take 81 mg by mouth daily.   Yes Historical Provider, MD  ibuprofen (ADVIL,MOTRIN) 800 MG tablet Take 400-800 mg by mouth every 8 (eight) hours as needed (pain).    Yes Historical Provider, MD  cyclobenzaprine (FLEXERIL) 10 MG tablet Take 1 tablet (10 mg total) by mouth 3 (three) times daily as needed. 02/13/14   Jazzalynn Rhudy L. Claudia Greenley, PA-C  oxyCODONE-acetaminophen (PERCOCET/ROXICET) 5-325 MG per tablet Take 1 tablet by mouth every 4 (four) hours as needed. 02/13/14   Nyzir Dubois L. Hilliard Borges, PA-C   BP 152/110  Pulse 101  Temp(Src) 98.2 F (36.8 C) (Oral)  Resp 16  SpO2 99% Physical Exam  Nursing note and vitals reviewed. Constitutional: He is oriented to person, place, and time. He appears well-developed and well-nourished. No distress.  HENT:  Head: Normocephalic and atraumatic.  Neck:  Normal range of motion. Neck supple.  Cardiovascular: Normal rate, regular rhythm, normal heart sounds and intact distal pulses.   No murmur heard. Pulmonary/Chest: Effort normal and breath sounds normal. No respiratory distress. He exhibits no tenderness.  Abdominal: Soft. He exhibits no distension and no mass. There is no tenderness. There is no rebound and no guarding.  Musculoskeletal: He exhibits tenderness. He exhibits no  edema.       Lumbar back: He exhibits tenderness and pain. He exhibits normal range of motion, no swelling, no deformity, no laceration and normal pulse.  ttp of the lower lumbar spine and right paraspinal muscles.    DP pulses are brisk and symmetrical.  Distal sensation intact.  Hip Flexors/Extensors are intact.  Pt has 5/5 strength against resistance of bilateral lower extremities.     Neurological: He is alert and oriented to person, place, and time. He has normal strength. No sensory deficit. He exhibits normal muscle tone. Coordination and gait normal.  Reflex Scores:      Patellar reflexes are 2+ on the right side and 2+ on the left side.      Achilles reflexes are 2+ on the right side and 2+ on the left side. Skin: Skin is warm and dry. No rash noted.  Psychiatric: He has a normal mood and affect. Thought content normal.    ED Course  Procedures (including critical care time) Labs Review Labs Reviewed - No data to display  Imaging Review No results found.   EKG Interpretation None      MDM   Final diagnoses:  Right lumbar radiculopathy   Previous charts reviewed.    Pt is well appearing , non-toxic.  Ambulates with steady gait.  No focal neuro deficits or concerning sx's for emergent neurological or infectious process.  Patient has completed outpatient course of IV vancomycin., denies continued IV drug use.   Reports having upcoming appt with neurosurgeon.  He denies any symptoms at this time other than low back pain and right radicular pain.  VSS.  Advised that he will need further f/u with his PMD, short course of pain medication and flexeril prescribed.  He appears stable for d/c and agrees to care plan.        Kasi Lasky L. Trisha Mangle, PA-C 02/14/14 2149

## 2014-02-16 NOTE — ED Provider Notes (Signed)
Medical screening examination/treatment/procedure(s) were performed by non-physician practitioner and as supervising physician I was immediately available for consultation/collaboration.   EKG Interpretation None        Samuel Jester, DO 02/16/14 1647

## 2014-09-19 ENCOUNTER — Emergency Department (HOSPITAL_COMMUNITY)
Admission: EM | Admit: 2014-09-19 | Discharge: 2014-09-19 | Disposition: A | Discharge status: Home / Self Care | Payer: Self-pay | Attending: Emergency Medicine | Admitting: Emergency Medicine

## 2014-09-19 ENCOUNTER — Encounter (HOSPITAL_COMMUNITY): Payer: Self-pay | Admitting: *Deleted

## 2014-09-19 DIAGNOSIS — I10 Essential (primary) hypertension: Secondary | ICD-10-CM | POA: Insufficient documentation

## 2014-09-19 DIAGNOSIS — Z7982 Long term (current) use of aspirin: Secondary | ICD-10-CM | POA: Insufficient documentation

## 2014-09-19 DIAGNOSIS — M79604 Pain in right leg: Secondary | ICD-10-CM

## 2014-09-19 DIAGNOSIS — M545 Low back pain: Secondary | ICD-10-CM | POA: Insufficient documentation

## 2014-09-19 DIAGNOSIS — F419 Anxiety disorder, unspecified: Secondary | ICD-10-CM | POA: Insufficient documentation

## 2014-09-19 DIAGNOSIS — Z720 Tobacco use: Secondary | ICD-10-CM | POA: Insufficient documentation

## 2014-09-19 DIAGNOSIS — Z79899 Other long term (current) drug therapy: Secondary | ICD-10-CM | POA: Insufficient documentation

## 2014-09-19 HISTORY — DX: Dorsalgia, unspecified: M54.9

## 2014-09-19 MED ORDER — OXYCODONE-ACETAMINOPHEN 5-325 MG PO TABS: 1.0000 | ORAL_TABLET | ORAL | Status: DC | PRN

## 2014-09-19 MED ORDER — CYCLOBENZAPRINE HCL 10 MG PO TABS: 10.0000 mg | ORAL_TABLET | Freq: Two times a day (BID) | ORAL | Status: DC | PRN

## 2014-09-19 MED ORDER — CYCLOBENZAPRINE HCL 10 MG PO TABS
10.0000 mg | ORAL_TABLET | Freq: Once | ORAL | Status: AC
  Administered 2014-09-19: 10 mg via ORAL
  Filled 2014-09-19: qty 1

## 2014-09-19 MED ORDER — IBUPROFEN 800 MG PO TABS: 800.0000 mg | ORAL_TABLET | Freq: Three times a day (TID) | ORAL | Status: DC

## 2014-09-19 MED ORDER — OXYCODONE-ACETAMINOPHEN 5-325 MG PO TABS
1.0000 | ORAL_TABLET | Freq: Once | ORAL | Status: AC
  Administered 2014-09-19: 1 via ORAL
  Filled 2014-09-19: qty 1

## 2014-09-19 NOTE — ED Provider Notes (Signed)
CSN: [id number]     Arrival date & time 09/19/14  1758 History   First MD Initiated Contact with Patient 09/19/14 1812     Chief Complaint  Patient presents with  . Back Pain     (Consider location/radiation/quality/duration/timing/severity/associated sxs/prior Treatment) HPI   [name] [name] is a 39 y.o. male with history of recurrent low back pain, presents to the Emergency Department complaining of worsening low back pain for 3-4 days.  He states that his lower back began hurting after doing yard work.  He describes sharp pains to his back that is worse with certain movements.  Pt improves slightly at rest. He states he has been taking ASA without relief.  He denies abd pain, numbness or weakness of the lower extremities, urine or bowel changes.   Past Medical History  Diagnosis Date  . Hypertension   . Anxiety   . Back pain    Past Surgical History  Procedure Laterality Date  . Tee without cardioversion N/A 01/21/2014    Procedure: TRANSESOPHAGEAL ECHOCARDIOGRAM (TEE) with propofol;  Surgeon: Antoine PocheJonathan F Branch, MD;  Location: [facility];  Service: Endoscopy;  Laterality: N/A;   History reviewed. No pertinent family history. History  Substance Use Topics  . Smoking status: Current Every Day Smoker -- 0.50 packs/day    Types: Cigarettes  . Smokeless tobacco: Not on file  . Alcohol Use: Yes     Comment: occ    Review of Systems  Constitutional: Negative for fever.  Respiratory: Negative for shortness of breath.   Gastrointestinal: Negative for vomiting, abdominal pain and constipation.  Genitourinary: Negative for dysuria, hematuria, flank pain, decreased urine volume and difficulty urinating.  Musculoskeletal: Positive for back pain. Negative for joint swelling.  Skin: Negative for rash.  Neurological: Negative for weakness and numbness.  All other systems reviewed and are negative.     Allergies  Review of patient's allergies indicates no known allergies.  Home  Medications   Prior to Admission medications   Medication Sig Start Date End Date Taking? Authorizing Provider  ALPRAZolam Prudy Feeler(XANAX) 1 MG tablet Take 1 mg by mouth 3 (three) times daily.     Historical Provider, MD  aspirin EC 81 MG tablet Take 81 mg by mouth daily.    Historical Provider, MD  cyclobenzaprine (FLEXERIL) 10 MG tablet Take 1 tablet (10 mg total) by mouth 3 (three) times daily as needed. 02/13/14   Tammi Aryn Safran, PA-C  ibuprofen (ADVIL,MOTRIN) 800 MG tablet Take 400-800 mg by mouth every 8 (eight) hours as needed (pain).     Historical Provider, MD  oxyCODONE-acetaminophen (PERCOCET/ROXICET) 5-325 MG per tablet Take 1 tablet by mouth every 4 (four) hours as needed. 02/13/14   Tammi Mireyah Chervenak, PA-C   BP 150/87 mmHg  Pulse 117  Temp(Src) 98.6 F (37 C) (Oral)  Resp 20  Ht 6' (1.829 m)  Wt 190 lb (86.183 kg)  BMI 25.76 kg/m2  SpO2 100% Physical Exam  Constitutional: He is oriented to person, place, and time. He appears well-developed and well-nourished. No distress.  HENT:  Head: Normocephalic and atraumatic.  Neck: Normal range of motion. Neck supple.  Cardiovascular: Normal rate, regular rhythm, normal heart sounds and intact distal pulses.   No murmur heard. Pulmonary/Chest: Effort normal and breath sounds normal. No respiratory distress.  Abdominal: Soft. He exhibits no distension. There is no tenderness.  Musculoskeletal: He exhibits tenderness. He exhibits no edema.       Lumbar back: He exhibits tenderness and pain. He exhibits  normal range of motion, no swelling, no deformity, no laceration and normal pulse.  Diffuse ttp of the lower lumbar spine and paraspinal muscles. DP pulses are brisk and symmetrical.  Distal sensation intact.  Hip Flexors/Extensors are intact.  Pt has 5/5 strength against resistance of bilateral lower extremities.     Neurological: He is alert and oriented to person, place, and time. He has normal strength. No sensory deficit. He exhibits normal  muscle tone. Coordination and gait normal.  Reflex Scores:      Patellar reflexes are 2+ on the right side and 2+ on the left side.      Achilles reflexes are 2+ on the right side and 2+ on the left side. Skin: Skin is warm and dry. No rash noted.  Nursing note and vitals reviewed.   ED Course  Procedures (including critical care time) Labs Review Labs Reviewed - No data to display  Imaging Review No results found.   EKG Interpretation None      MDM   Final diagnoses:  Lumbar pain with radiation down both legs     Patient reviewed on the [facility] narcotics database. No recent prescriptions filed  Pt is ambulatory, no focal neuro deficits, no concerning sx's for emergent neurological or infectious process.  Pt given referral info and agrees to establish PMD care.  Appears stable for d/c  Severiano Gilbert, PA-C 09/21/14 1620  Lorre Nick, MD 09/22/14 [phone]

## 2014-09-19 NOTE — ED Notes (Signed)
Low back pain  Since doing yard work 3-4 days ago.

## 2014-09-19 NOTE — Discharge Instructions (Signed)

## 2014-09-19 NOTE — ED Notes (Signed)
Patient given discharge instruction, verbalized understand. Patient ambulatory out of the department.  

## 2015-01-26 ENCOUNTER — Encounter: Payer: Self-pay | Admitting: Thoracic Surgery (Cardiothoracic Vascular Surgery)

## 2015-01-26 ENCOUNTER — Telehealth: Payer: Self-pay | Admitting: Thoracic Surgery (Cardiothoracic Vascular Surgery)

## 2015-01-26 ENCOUNTER — Emergency Department (HOSPITAL_COMMUNITY): Payer: Self-pay

## 2015-01-26 ENCOUNTER — Observation Stay (HOSPITAL_COMMUNITY)
Admission: EM | Admit: 2015-01-26 | Discharge: 2015-01-27 | Disposition: A | Discharge status: Home / Self Care | Payer: Self-pay | Attending: Internal Medicine | Admitting: Internal Medicine

## 2015-01-26 ENCOUNTER — Encounter (HOSPITAL_COMMUNITY): Payer: Self-pay | Admitting: Emergency Medicine

## 2015-01-26 DIAGNOSIS — S2231XA Fracture of one rib, right side, initial encounter for closed fracture: Secondary | ICD-10-CM

## 2015-01-26 DIAGNOSIS — X58XXXA Exposure to other specified factors, initial encounter: Secondary | ICD-10-CM | POA: Insufficient documentation

## 2015-01-26 DIAGNOSIS — Y999 Unspecified external cause status: Secondary | ICD-10-CM | POA: Insufficient documentation

## 2015-01-26 DIAGNOSIS — F419 Anxiety disorder, unspecified: Secondary | ICD-10-CM | POA: Insufficient documentation

## 2015-01-26 DIAGNOSIS — R112 Nausea with vomiting, unspecified: Secondary | ICD-10-CM | POA: Insufficient documentation

## 2015-01-26 DIAGNOSIS — Z79899 Other long term (current) drug therapy: Secondary | ICD-10-CM | POA: Insufficient documentation

## 2015-01-26 DIAGNOSIS — S20211A Contusion of right front wall of thorax, initial encounter: Principal | ICD-10-CM | POA: Insufficient documentation

## 2015-01-26 DIAGNOSIS — Z720 Tobacco use: Secondary | ICD-10-CM | POA: Diagnosis present

## 2015-01-26 DIAGNOSIS — Y929 Unspecified place or not applicable: Secondary | ICD-10-CM | POA: Insufficient documentation

## 2015-01-26 DIAGNOSIS — I10 Essential (primary) hypertension: Secondary | ICD-10-CM | POA: Insufficient documentation

## 2015-01-26 DIAGNOSIS — Y939 Activity, unspecified: Secondary | ICD-10-CM | POA: Insufficient documentation

## 2015-01-26 DIAGNOSIS — S20219A Contusion of unspecified front wall of thorax, initial encounter: Secondary | ICD-10-CM | POA: Diagnosis present

## 2015-01-26 HISTORY — DX: Fracture of one rib, right side, initial encounter for closed fracture: S22.31XA

## 2015-01-26 LAB — COMPREHENSIVE METABOLIC PANEL
ALBUMIN: 4.1 g/dL (ref 3.5–5.0)
ALK PHOS: 93 U/L (ref 38–126)
ALT: 8 U/L — ABNORMAL LOW (ref 17–63)
AST: 13 U/L — AB (ref 15–41)
Anion gap: 8 (ref 5–15)
BILIRUBIN TOTAL: 0.7 mg/dL (ref 0.3–1.2)
BUN: 7 mg/dL (ref 6–20)
CALCIUM: 9.1 mg/dL (ref 8.9–10.3)
CO2: 27 mmol/L (ref 22–32)
Chloride: 102 mmol/L (ref 101–111)
Creatinine, Ser: 0.82 mg/dL (ref 0.61–1.24)
GFR calc Af Amer: >60 mL/min (ref >60)
GFR calc non Af Amer: >60 mL/min (ref >60)
GLUCOSE: 105 mg/dL — AB (ref 65–99)
Potassium: 4.3 mmol/L (ref 3.5–5.1)
SODIUM: 137 mmol/L (ref 135–145)
TOTAL PROTEIN: 7.8 g/dL (ref 6.5–8.1)

## 2015-01-26 LAB — CBC WITH DIFFERENTIAL/PLATELET
BASOS PCT: 0 % (ref 0–1)
Basophils Absolute: 0 10*3/uL (ref 0.0–0.1)
Eosinophils Absolute: 0.1 10*3/uL (ref 0.0–0.7)
Eosinophils Relative: 1 % (ref 0–5)
HCT: 39.2 % (ref 39.0–52.0)
Hemoglobin: 13.1 g/dL (ref 13.0–17.0)
LYMPHS ABS: 1.3 10*3/uL (ref 0.7–4.0)
Lymphocytes Relative: 16 % (ref 12–46)
MCH: 32.2 pg (ref 26.0–34.0)
MCHC: 33.4 g/dL (ref 30.0–36.0)
MCV: 96.3 fL (ref 78.0–100.0)
MONO ABS: 0.9 10*3/uL (ref 0.1–1.0)
Monocytes Relative: 11 % (ref 3–12)
Neutro Abs: 5.8 10*3/uL (ref 1.7–7.7)
Neutrophils Relative %: 72 % (ref 43–77)
Platelets: 378 10*3/uL (ref 150–400)
RBC: 4.07 MIL/uL — ABNORMAL LOW (ref 4.22–5.81)
RDW: 12.8 % (ref 11.5–15.5)
WBC: 8.1 10*3/uL (ref 4.0–10.5)

## 2015-01-26 LAB — LIPASE, BLOOD: Lipase: 16 U/L — ABNORMAL LOW (ref 22–51)

## 2015-01-26 MED ORDER — MORPHINE SULFATE (PF) 2 MG/ML IV SOLN
2.0000 mg | INTRAVENOUS | Status: DC | PRN
  Administered 2015-01-27 (×3): 4 mg via INTRAVENOUS
  Filled 2015-01-26 (×3): qty 2

## 2015-01-26 MED ORDER — OXYCODONE HCL 5 MG PO TABS
5.0000 mg | ORAL_TABLET | ORAL | Status: DC | PRN
  Administered 2015-01-27: 5 mg via ORAL
  Filled 2015-01-26: qty 1

## 2015-01-26 MED ORDER — SODIUM CHLORIDE 0.9 % IV SOLN
INTRAVENOUS | Status: DC
  Administered 2015-01-26: 21:00:00 via INTRAVENOUS

## 2015-01-26 MED ORDER — ALBUTEROL SULFATE (2.5 MG/3ML) 0.083% IN NEBU
2.5000 mg | INHALATION_SOLUTION | RESPIRATORY_TRACT | Status: DC | PRN
Start: 2015-01-26 — End: 2015-01-27

## 2015-01-26 MED ORDER — SODIUM CHLORIDE 0.9 % IJ SOLN
3.0000 mL | Freq: Two times a day (BID) | INTRAMUSCULAR | Status: DC
  Administered 2015-01-26: 3 mL via INTRAVENOUS

## 2015-01-26 MED ORDER — SODIUM CHLORIDE 0.9 % IV BOLUS (SEPSIS)
1000.0000 mL | Freq: Once | INTRAVENOUS | Status: AC
  Administered 2015-01-26: 1000 mL via INTRAVENOUS

## 2015-01-26 MED ORDER — GUAIFENESIN ER 600 MG PO TB12
1200.0000 mg | ORAL_TABLET | Freq: Two times a day (BID) | ORAL | Status: DC
  Administered 2015-01-26 – 2015-01-27 (×2): 1200 mg via ORAL
  Filled 2015-01-26 (×2): qty 2

## 2015-01-26 MED ORDER — HYDROMORPHONE HCL 1 MG/ML IJ SOLN
1.0000 mg | INTRAMUSCULAR | Status: AC | PRN
  Administered 2015-01-26 – 2015-01-27 (×2): 1 mg via INTRAVENOUS
  Filled 2015-01-26 (×3): qty 1

## 2015-01-26 MED ORDER — FENTANYL CITRATE (PF) 100 MCG/2ML IJ SOLN
100.0000 ug | Freq: Once | INTRAMUSCULAR | Status: AC
  Administered 2015-01-26: 100 ug via INTRAVENOUS
  Filled 2015-01-26: qty 2

## 2015-01-26 MED ORDER — HYDROMORPHONE HCL 1 MG/ML IJ SOLN
1.0000 mg | Freq: Once | INTRAMUSCULAR | Status: AC
  Administered 2015-01-26: 1 mg via INTRAVENOUS
  Filled 2015-01-26: qty 1

## 2015-01-26 MED ORDER — IOHEXOL 300 MG/ML  SOLN
100.0000 mL | Freq: Once | INTRAMUSCULAR | Status: AC | PRN
  Administered 2015-01-26: 100 mL via INTRAVENOUS

## 2015-01-26 MED ORDER — IOHEXOL 300 MG/ML  SOLN
25.0000 mL | Freq: Once | INTRAMUSCULAR | Status: AC | PRN
  Administered 2015-01-26: 25 mL via ORAL

## 2015-01-26 MED ORDER — ALPRAZOLAM 1 MG PO TABS
1.0000 mg | ORAL_TABLET | Freq: Three times a day (TID) | ORAL | Status: DC | PRN
  Administered 2015-01-26 – 2015-01-27 (×2): 1 mg via ORAL
  Filled 2015-01-26 (×2): qty 1

## 2015-01-26 MED ORDER — ONDANSETRON HCL 4 MG/2ML IJ SOLN: 4.0000 mg | Freq: Three times a day (TID) | INTRAMUSCULAR | Status: AC | PRN

## 2015-01-26 MED ORDER — SODIUM CHLORIDE 0.9 % IV SOLN: INTRAVENOUS | Status: AC

## 2015-01-26 MED ORDER — ONDANSETRON HCL 4 MG/2ML IJ SOLN
4.0000 mg | Freq: Once | INTRAMUSCULAR | Status: AC
  Administered 2015-01-26: 4 mg via INTRAVENOUS
  Filled 2015-01-26: qty 2

## 2015-01-26 NOTE — Discharge Instructions (Signed)
Please do not drive or carry heavy objects or do strenuous activity till cleared by your physician.

## 2015-01-26 NOTE — Telephone Encounter (Signed)
Received telephone call from [facility] emergency department regarding patient w/ history of blunt trauma to right chest approximately 5 days ago - presented to ED with symptoms of pain w/out shortness of breath.  CT abdomen images reviewed demonstrating fracture at junction of right 6th rib with 6th costal cartilage and surrounding hematoma in the anterior chest wall.  No obvious complicating features.  After discussing this patient's clinical condition via telephone I do not recommend transfer to [facility] for further management.  Isolated rib fractures seldom require surgical intervention in the absence of significant complicating features such as hemopneumothorax or flail chest.  I recommend pain control and incentive spirometry.  The patient should be on limited physical activity with strict precautions tpo avoid any lifting, straining, or strenuous activity.  The location of this fracture might be associated with a potential for delayed healing.  Follow up imaging for the surrounding hematoma could be considered in 1-2 weeks but may not be necessary if there is no increase swelling or other concerning symptoms.  Purcell Nails, MD 01/26/2015 8:12 PM

## 2015-01-26 NOTE — H&P (Signed)
Patient Demographics  [name] [name], is a 39 y.o. male  MRN: [id number]   DOB - 08-28-1975  Admit Date - 01/26/2015  Outpatient Primary MD for the patient is Kirk Ruths, MD   With History of -  Past Medical History  Diagnosis Date  . Hypertension   . Anxiety   . Back pain   . Fracture of rib of right side 01/26/2015      Past Surgical History  Procedure Laterality Date  . Tee without cardioversion N/A 01/21/2014    Procedure: TRANSESOPHAGEAL ECHOCARDIOGRAM (TEE) with propofol;  Surgeon: Antoine Poche, MD;  Location: [facility];  Service: Endoscopy;  Laterality: N/A;    in for   Chief Complaint  Patient presents with  . Abdominal Pain     HPI  [name] [name]  is a 39 y.o. male, with past medical history of hypertension, this is a bacteremia in the past, presents with complaints of right sided chest, right upper quadrant pain, patient reports symptoms going on for the last 5 days, report that started after he was playing with his nephew, he hit the edge of a dresser in the right chest area, reports initially no significant pain, but pain became progressive, could not tolerate over the last 2 days, which prompted him to come to the ED, CT abdomen and pelvis showing broken sixth rib, with fluid collection(hematoma surrounding the rib), patient's hemoglobin has been stable, has significant pain and required multiple IV doses of pain medication, so hospitalist requested to admit for further management.    Review of Systems    In addition to the HPI above, No Fever-chills, No Headache, No changes with Vision or hearing, No problems swallowing food or Liquids, And planes of musculature skeletal Chest pain, Cough or Shortness of Breath, Complains of right upper quadrant, right muscle skeletal chest pain, No Nausea or Vommitting, Bowel movements are regular, No Blood in stool or Urine, No dysuria, No new skin rashes or bruises, No new joints pains-aches,  No new  weakness, tingling, numbness in any extremity, No recent weight gain or loss, No polyuria, polydypsia or polyphagia, No significant Mental Stressors.  A full 10 point Review of Systems was done, except as stated above, all other Review of Systems were negative.   Social History Social History  Substance Use Topics  . Smoking status: Current Every Day Smoker -- 0.50 packs/day    Types: Cigarettes  . Smokeless tobacco: Not on file  . Alcohol Use: Yes     Comment: rare     Family History History reviewed. No pertinent family history. Family history of bleeding disorder or pneumothorax.  Prior to Admission medications   Medication Sig Start Date End Date Taking? Authorizing Provider  ALPRAZolam Prudy Feeler) 1 MG tablet Take 1 mg by mouth 3 (three) times daily.    Yes Historical Provider, MD  aspirin EC 81 MG tablet Take 81 mg by mouth daily.   Yes Historical Provider, MD  oxyCODONE-acetaminophen (PERCOCET/ROXICET) 5-325 MG per tablet Take 1 tablet by mouth every 4 (four) hours as needed. 09/19/14  Yes Tammy Triplett, PA-C  cyclobenzaprine (FLEXERIL) 10 MG tablet Take 1 tablet (10 mg total) by mouth 2 (two) times daily as needed for muscle spasms. Patient not taking: Reported on 01/26/2015 09/19/14   Tammy Triplett, PA-C  ibuprofen (ADVIL,MOTRIN) 800 MG tablet Take 1 tablet (800 mg total) by mouth 3 (three) times daily. Patient not taking: Reported on 01/26/2015 09/19/14   Pauline Aus, PA-C  No Known Allergies  Physical Exam  Vitals  Blood pressure 128/86, pulse 92, temperature 98.3 F (36.8 C), temperature source Oral, resp. rate 18, height 6' (1.829 m), weight 81.647 kg (180 lb), SpO2 98 %.   1. General well-nourished male lying in bed in NAD,    2. Normal affect and insight, Not Suicidal or Homicidal, Awake Alert, Oriented X 3.  3. No F.N deficits, ALL C.Nerves Intact, Strength 5/5 all 4 extremities, Sensation intact all 4 extremities, Plantars down going.  4. Ears and Eyes  appear Normal, Conjunctivae clear, PERRLA. Moist Oral Mucosa.  5. Supple Neck, No JVD, No cervical lymphadenopathy appriciated, No Carotid Bruits.  6. Symmetrical Chest wall movement, Good air movement bilaterally, CTAB.  7. RRR, No Gallops, Rubs or Murmurs, No Parasternal Heave.  8. Positive Bowel Sounds, Abdomen Soft, [place] tenderness to palpation in right lower (rib cage) chest area No organomegaly appriciated,No rebound -guarding or rigidity.  9.  No Cyanosis, Normal Skin Turgor, No Skin Rash or Bruise.  10. Good muscle tone,  joints appear normal , no effusions, Normal ROM.  11. No Palpable Lymph Nodes in Neck or Axillae    Data Review  CBC  Recent Labs Lab 01/26/15 1700  WBC 8.1  HGB 13.1  HCT 39.2  PLT 378  MCV 96.3  MCH 32.2  MCHC 33.4  RDW 12.8  LYMPHSABS 1.3  MONOABS 0.9  EOSABS 0.1  BASOSABS 0.0   ------------------------------------------------------------------------------------------------------------------  Chemistries   Recent Labs Lab 01/26/15 1700  NA 137  K 4.3  CL 102  CO2 27  GLUCOSE 105*  BUN 7  CREATININE 0.82  CALCIUM 9.1  AST 13*  ALT 8*  ALKPHOS 93  BILITOT 0.7   ------------------------------------------------------------------------------------------------------------------ estimated creatinine clearance is 135.4 mL/min (by C-G formula based on Cr of 0.82). ------------------------------------------------------------------------------------------------------------------ No results for input(s): TSH, T4TOTAL, T3FREE, THYROIDAB in the last 72 hours.  Invalid input(s): FREET3   Coagulation profile No results for input(s): INR, PROTIME in the last 168 hours. ------------------------------------------------------------------------------------------------------------------- No results for input(s): DDIMER in the last 72  hours. -------------------------------------------------------------------------------------------------------------------  Cardiac Enzymes No results for input(s): CKMB, TROPONINI, MYOGLOBIN in the last 168 hours.  Invalid input(s): CK ------------------------------------------------------------------------------------------------------------------ Invalid input(s): POCBNP   ---------------------------------------------------------------------------------------------------------------  Urinalysis    Component Value Date/Time   COLORURINE YELLOW 01/23/2014 2043   APPEARANCEUR CLEAR 01/23/2014 2043   LABSPEC <1.005* 01/23/2014 2043   PHURINE 5.5 01/23/2014 2043   GLUCOSEU NEGATIVE 01/23/2014 2043   HGBUR LARGE* 01/23/2014 2043   BILIRUBINUR NEGATIVE 01/23/2014 2043   KETONESUR NEGATIVE 01/23/2014 2043   PROTEINUR NEGATIVE 01/23/2014 2043   UROBILINOGEN 0.2 01/23/2014 2043   NITRITE NEGATIVE 01/23/2014 2043   LEUKOCYTESUR NEGATIVE 01/23/2014 2043    ----------------------------------------------------------------------------------------------------------------  Imaging results:   Ct Abdomen Pelvis W Contrast  01/26/2015   CLINICAL DATA:  Acute onset of right upper quadrant abdominal pain and swelling. Nausea and dry heaves. Initial encounter. Edit  EXAM: CT ABDOMEN AND PELVIS WITH CONTRAST  TECHNIQUE: Multidetector CT imaging of the abdomen and pelvis was performed using the standard protocol following bolus administration of intravenous contrast.  CONTRAST:  OMNIPAQUE IOHEXOL 300 MG/ML  SOLN  COMPARISON:  MRI of the lumbar spine performed 01/16/2014  FINDINGS: A trace right pleural effusion is noted.  There is an apparent unusual collection of relatively high attenuation material measuring 11.1 x 8.3 x 5.1 cm at the right upper quadrant anterior abdominal wall anterior to the hepatic dome, thought to reflect blood surrounding a fracture of  the right sixth chondral cartilage.  No acute contrast extravasation is seen. There is a defect at the right sixth chondral cartilage. Would monitor closely to ensure that this decreases in size over time, to exclude an underlying pathologic process.  Trace associated blood tracks into the epicardial fat pad and directly anterior to the liver. Trace blood extends inferiorly along the musculature of the right anterior abdominal wall.  The liver and spleen are unremarkable in appearance. The gallbladder is within normal limits. The pancreas and adrenal glands are unremarkable.  Scattered bilateral renal cysts are seen, measuring up to 1.4 cm in size. The kidneys are otherwise unremarkable. There is no evidence of hydronephrosis. No renal or ureteral stones are seen.  No free fluid is identified. The small bowel is unremarkable in appearance. The stomach is within normal limits. No acute vascular abnormalities are seen. Minimal calcification is noted along the right common iliac artery.  The appendix is normal in caliber and contains air, without evidence of appendicitis. The colon is unremarkable in appearance.  The bladder is moderately distended and grossly unremarkable. The prostate remains normal in size. No inguinal lymphadenopathy is seen.  No acute osseous abnormalities are identified.  IMPRESSION: 1. Unusual collection of relatively high attenuation material measuring 11.1 x 8.3 x 5.1 cm at the right upper quadrant anterior abdominal wall, anterior to the hepatic dome, thought to reflect blood surrounding a fracture of the right sixth chondral cartilage. No acute contrast extravasation seen. Associated defect at the right chondral cartilage. Would monitor closely to ensure that this collection decreases in size over time, to exclude an underlying pathologic process. 2. Trace associated fluid tracks into the epicardial fat pad and directly anterior to the liver, and inferiorly along the musculature of the right anterior abdominal wall. 3.  Scattered bilateral renal cysts seen. These results were called by telephone at the time of interpretation on 01/26/2015 at 6:12 pm to Dr. Chilton Si, who verbally acknowledged these results.   Electronically Signed   By: Roanna Raider M.D.   On: 01/26/2015 18:12      Assessment & Plan  Principal Problem:   Chest wall hematoma Active Problems:   Tobacco abuse   Fracture of rib of right side    Chest wall hematoma/right rib fracture - This is traumatic rib cage fracture, as he hit dresser corner, case was discussed with details with Dr. Cornelius Moras , [facility] surgery [facility], this is related to chest wall hematoma from traumatic rib fracture, event happened 5 days ago, so should be stabilizing now as per CT surgery, plan is for pain control, incentive spirometry, and continue managing as rib fracture, no indication for repeat imaging during hospital stay, will hold aspirin, continue incentive spirometry, when necessary pain medication, icy hot patch, Dr. Cornelius Moras recommended no driving, no heavy lifting, no strenuous activity for next 3 month, can follow with Dr. Barry Dienes as an outpatient within 1 week from discharge.  Tobacco abuse - Counseled   DVT Prophylaxis  SCDs   AM Labs Ordered, also please review Full Orders  Family Communication: Admission, patients condition and plan of care including tests being ordered have been discussed with the patient  who indicate understanding and agree with the plan and Code Status.  Code Status full  Likely DC to home  Condition GUARDED    Time spent in minutes : 55 minutes    Lorriane Dehart M.D on 01/26/2015 at 8:05 PM  Between 7am to 7pm - Pager - [phone]  After 7pm go to [email or web address] - password [email or web address]  And look for the night coverage person covering me after hours  [facility] Office  [phone]

## 2015-01-26 NOTE — ED Notes (Signed)
Pt c/o right upper abd pain with n/dry heaves x 5 days. Pt reports it feels swollen. Denies injury.

## 2015-01-27 DIAGNOSIS — Z720 Tobacco use: Secondary | ICD-10-CM

## 2015-01-27 DIAGNOSIS — S20211D Contusion of right front wall of thorax, subsequent encounter: Secondary | ICD-10-CM

## 2015-01-27 DIAGNOSIS — S2231XD Fracture of one rib, right side, subsequent encounter for fracture with routine healing: Secondary | ICD-10-CM

## 2015-01-27 LAB — PROTIME-INR
INR: 1.09 (ref 0.00–1.49)
Prothrombin Time: 14.3 seconds (ref 11.6–15.2)

## 2015-01-27 LAB — COMPREHENSIVE METABOLIC PANEL
ALT: 7 U/L — AB (ref 17–63)
AST: 8 U/L — AB (ref 15–41)
Albumin: 3.3 g/dL — ABNORMAL LOW (ref 3.5–5.0)
Alkaline Phosphatase: 78 U/L (ref 38–126)
Anion gap: 5 (ref 5–15)
BILIRUBIN TOTAL: 0.6 mg/dL (ref 0.3–1.2)
BUN: 6 mg/dL (ref 6–20)
CHLORIDE: 105 mmol/L (ref 101–111)
CO2: 29 mmol/L (ref 22–32)
CREATININE: 0.79 mg/dL (ref 0.61–1.24)
Calcium: 8.7 mg/dL — ABNORMAL LOW (ref 8.9–10.3)
GFR calc Af Amer: >60 mL/min (ref >60)
GLUCOSE: 107 mg/dL — AB (ref 65–99)
Potassium: 4.4 mmol/L (ref 3.5–5.1)
Sodium: 139 mmol/L (ref 135–145)
Total Protein: 6.6 g/dL (ref 6.5–8.1)

## 2015-01-27 LAB — CBC
HCT: 36.6 % — ABNORMAL LOW (ref 39.0–52.0)
Hemoglobin: 11.8 g/dL — ABNORMAL LOW (ref 13.0–17.0)
MCH: 31.6 pg (ref 26.0–34.0)
MCHC: 32.2 g/dL (ref 30.0–36.0)
MCV: 97.9 fL (ref 78.0–100.0)
PLATELETS: 319 10*3/uL (ref 150–400)
RBC: 3.74 MIL/uL — ABNORMAL LOW (ref 4.22–5.81)
RDW: 13 % (ref 11.5–15.5)
WBC: 7.1 10*3/uL (ref 4.0–10.5)

## 2015-01-27 MED ORDER — GUAIFENESIN ER 600 MG PO TB12: 600.0000 mg | ORAL_TABLET | Freq: Two times a day (BID) | ORAL | Status: DC

## 2015-01-27 MED ORDER — OXYCODONE-ACETAMINOPHEN 5-325 MG PO TABS: 1.0000 | ORAL_TABLET | ORAL | Status: DC | PRN

## 2015-01-27 NOTE — Progress Notes (Signed)
Discharge instructions given to patient. Patient discharged home.

## 2015-01-27 NOTE — Care Management Note (Signed)
Case Management Note  Patient Details  Name: MAMOUDOU MULVEHILL MRN: [id number] Date of Birth: 1975/09/07  Subjective/Objective:                  Pt admitted from home with CP. Pt lives with significant other and will return home at discharge. Pt is independent with ADL's.   Action/Plan: Financial counselor is aware of pts self pay status. No CM needs noted.  Expected Discharge Date:   01/27/15               Expected Discharge Plan:  Home/Self Care  In-House Referral:  Financial Counselor  Discharge planning Services  CM Consult  Post Acute Care Choice:  NA Choice offered to:  NA  DME Arranged:    DME Agency:     HH Arranged:    HH Agency:     Status of Service:  Completed, signed off  Medicare Important Message Given:    Date Medicare IM Given:    Medicare IM give by:    Date Additional Medicare IM Given:    Additional Medicare Important Message give by:     If discussed at Long Length of Stay Meetings, dates discussed:    Additional Comments:  Cheryl Flash, RN 01/27/2015, 10:50 AM
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
## 2015-02-02 ENCOUNTER — Emergency Department (HOSPITAL_COMMUNITY): Payer: Self-pay

## 2015-02-02 ENCOUNTER — Emergency Department (HOSPITAL_COMMUNITY)
Admission: EM | Admit: 2015-02-02 | Discharge: 2015-02-02 | Disposition: A | Discharge status: Home / Self Care | Payer: Self-pay | Attending: Emergency Medicine | Admitting: Emergency Medicine

## 2015-02-02 ENCOUNTER — Encounter (HOSPITAL_COMMUNITY): Payer: Self-pay | Admitting: *Deleted

## 2015-02-02 DIAGNOSIS — Z720 Tobacco use: Secondary | ICD-10-CM | POA: Insufficient documentation

## 2015-02-02 DIAGNOSIS — I10 Essential (primary) hypertension: Secondary | ICD-10-CM | POA: Insufficient documentation

## 2015-02-02 DIAGNOSIS — Z79899 Other long term (current) drug therapy: Secondary | ICD-10-CM | POA: Insufficient documentation

## 2015-02-02 DIAGNOSIS — F419 Anxiety disorder, unspecified: Secondary | ICD-10-CM | POA: Insufficient documentation

## 2015-02-02 DIAGNOSIS — S20211D Contusion of right front wall of thorax, subsequent encounter: Secondary | ICD-10-CM | POA: Insufficient documentation

## 2015-02-02 DIAGNOSIS — S2231XD Fracture of one rib, right side, subsequent encounter for fracture with routine healing: Secondary | ICD-10-CM | POA: Insufficient documentation

## 2015-02-02 DIAGNOSIS — W51XXXD Accidental striking against or bumped into by another person, subsequent encounter: Secondary | ICD-10-CM | POA: Insufficient documentation

## 2015-02-02 LAB — BASIC METABOLIC PANEL
Anion gap: 9 (ref 5–15)
BUN: <5 mg/dL — ABNORMAL LOW (ref 6–20)
CO2: 27 mmol/L (ref 22–32)
Calcium: 9.6 mg/dL (ref 8.9–10.3)
Chloride: 105 mmol/L (ref 101–111)
Creatinine, Ser: 0.8 mg/dL (ref 0.61–1.24)
GFR calc Af Amer: >60 mL/min (ref >60)
GFR calc non Af Amer: >60 mL/min (ref >60)
Glucose, Bld: 106 mg/dL — ABNORMAL HIGH (ref 65–99)
Potassium: 4 mmol/L (ref 3.5–5.1)
Sodium: 141 mmol/L (ref 135–145)

## 2015-02-02 LAB — CBC WITH DIFFERENTIAL/PLATELET
Basophils Absolute: 0 10*3/uL (ref 0.0–0.1)
Basophils Relative: 0 % (ref 0–1)
Eosinophils Absolute: 0.1 10*3/uL (ref 0.0–0.7)
Eosinophils Relative: 1 % (ref 0–5)
HCT: 42 % (ref 39.0–52.0)
Hemoglobin: 13.9 g/dL (ref 13.0–17.0)
Lymphocytes Relative: 18 % (ref 12–46)
Lymphs Abs: 1.6 10*3/uL (ref 0.7–4.0)
MCH: 31.7 pg (ref 26.0–34.0)
MCHC: 33.1 g/dL (ref 30.0–36.0)
MCV: 95.9 fL (ref 78.0–100.0)
Monocytes Absolute: 0.7 10*3/uL (ref 0.1–1.0)
Monocytes Relative: 8 % (ref 3–12)
Neutro Abs: 6.4 10*3/uL (ref 1.7–7.7)
Neutrophils Relative %: 73 % (ref 43–77)
Platelets: 424 10*3/uL — ABNORMAL HIGH (ref 150–400)
RBC: 4.38 MIL/uL (ref 4.22–5.81)
RDW: 12.7 % (ref 11.5–15.5)
WBC: 8.8 10*3/uL (ref 4.0–10.5)

## 2015-02-02 MED ORDER — HYDROMORPHONE HCL 1 MG/ML IJ SOLN
1.0000 mg | Freq: Once | INTRAMUSCULAR | Status: AC
  Administered 2015-02-02: 1 mg via INTRAVENOUS
  Filled 2015-02-02: qty 1

## 2015-02-02 MED ORDER — IOHEXOL 300 MG/ML  SOLN
100.0000 mL | Freq: Once | INTRAMUSCULAR | Status: AC | PRN
  Administered 2015-02-02: 100 mL via INTRAVENOUS

## 2015-02-02 MED ORDER — OXYCODONE-ACETAMINOPHEN 5-325 MG PO TABS: 1.0000 | ORAL_TABLET | ORAL | Status: DC | PRN

## 2015-02-02 MED ORDER — OXYCODONE-ACETAMINOPHEN 5-325 MG PO TABS
2.0000 | ORAL_TABLET | Freq: Once | ORAL | Status: DC
  Filled 2015-02-02: qty 2

## 2015-02-02 MED ORDER — SODIUM CHLORIDE 0.9 % IV BOLUS (SEPSIS)
1000.0000 mL | Freq: Once | INTRAVENOUS | Status: AC
  Administered 2015-02-02: 1000 mL via INTRAVENOUS

## 2015-02-02 NOTE — Discharge Instructions (Signed)
Blunt Chest Trauma °Blunt chest trauma is an injury caused by a blow to the chest. These chest injuries can be very painful. Blunt chest trauma often results in bruised or broken (fractured) ribs. Most cases of bruised and fractured ribs from blunt chest traumas get better after 1 to 3 weeks of rest and pain medicine. Often, the soft tissue in the chest wall is also injured, causing pain and bruising. Internal organs, such as the heart and lungs, may also be injured. Blunt chest trauma can lead to serious medical problems. This injury requires immediate medical care. °CAUSES  °· Motor vehicle collisions. °· Falls. °· Physical violence. °· Sports injuries. °SYMPTOMS  °· Chest pain. The pain may be worse when you move or breathe deeply. °· Shortness of breath. °· Lightheadedness. °· Bruising. °· Tenderness. °· Swelling. °DIAGNOSIS  °Your caregiver will do a physical exam. X-rays may be taken to look for fractures. However, minor rib fractures may not show up on X-rays until a few days after the injury. If a more serious injury is suspected, further imaging tests may be done. This may include ultrasounds, computed tomography (CT) scans, or magnetic resonance imaging (MRI). °TREATMENT  °Treatment depends on the severity of your injury. Your caregiver may prescribe pain medicines and deep breathing exercises. °HOME CARE INSTRUCTIONS °· Limit your activities until you can move around without much pain. °· Do not do any strenuous work until your injury is healed. °· Put ice on the injured area. °¨ Put ice in a plastic bag. °¨ Place a towel between your skin and the bag. °¨ Leave the ice on for 15-20 minutes, 03-04 times a day. °· You may wear a rib belt as directed by your caregiver to reduce pain. °· Practice deep breathing as directed by your caregiver to keep your lungs clear. °· Only take over-the-counter or prescription medicines for pain, fever, or discomfort as directed by your caregiver. °SEEK IMMEDIATE MEDICAL  CARE IF:  °· You have increasing pain or shortness of breath. °· You cough up blood. °· You have nausea, vomiting, or abdominal pain. °· You have a fever. °· You feel dizzy, weak, or you faint. °MAKE SURE YOU: °· Understand these instructions. °· Will watch your condition. °· Will get help right away if you are not doing well or get worse. °Document Released: 07/07/2004 Document Revised: 08/22/2011 Document Reviewed: 03/16/2011 °[employer]® Patient Information ©2015 [employer]. This information is not intended to replace advice given to you by your health care provider. Make sure you discuss any questions you have with your health care provider. ° ° °

## 2015-02-02 NOTE — ED Notes (Addendum)
Patient reports right "broken rib", seen here for same last Saturday, c/o pain increases.
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
## 2015-02-08 NOTE — ED Provider Notes (Signed)
CSN: [id number]     Arrival date & time 01/26/15  1450 History   First MD Initiated Contact with Patient 01/26/15 1651     Chief Complaint  Patient presents with  . Abdominal Pain      HPI Pt c/o right upper abd pain with n/dry heaves x 5 days. Pt reports it feels swollen. Denies injury.  Past Medical History  Diagnosis Date  . Hypertension   . Anxiety   . Back pain   . Fracture of rib of right side 01/26/2015   Past Surgical History  Procedure Laterality Date  . Tee without cardioversion N/A 01/21/2014    Procedure: TRANSESOPHAGEAL ECHOCARDIOGRAM (TEE) with propofol;  Surgeon: Antoine Poche, MD;  Location: [facility];  Service: Endoscopy;  Laterality: N/A;   History reviewed. No pertinent family history. Social History  Substance Use Topics  . Smoking status: Current Every Day Smoker -- 0.50 packs/day    Types: Cigarettes  . Smokeless tobacco: None  . Alcohol Use: Yes     Comment: rare    Review of Systems  Constitutional: Negative for fever, chills and unexpected weight change.  Respiratory: Positive for chest tightness.   Gastrointestinal: Positive for nausea and vomiting.  All other systems reviewed and are negative.     Allergies  Review of patient's allergies indicates no known allergies.  Home Medications   Prior to Admission medications   Medication Sig Start Date End Date Taking? Authorizing Provider  ALPRAZolam Prudy Feeler) 1 MG tablet Take 1 mg by mouth 3 (three) times daily.    Yes Historical Provider, MD  acetaminophen (TYLENOL) 500 MG tablet Take 500 mg by mouth every 4 (four) hours as needed for mild pain or moderate pain.    Historical Provider, MD  guaiFENesin (MUCINEX) 600 MG 12 hr tablet Take 1 tablet (600 mg total) by mouth 2 (two) times daily. 01/27/15   Erick Blinks, MD  oxyCODONE-acetaminophen (PERCOCET/ROXICET) 5-325 MG per tablet Take 1-2 tablets by mouth every 4 (four) hours as needed. 02/02/15   Raeford Razor, MD   BP 122/67 mmHg  Pulse 77   Temp(Src) 98.7 F (37.1 C) (Oral)  Resp 18  Ht 6' (1.829 m)  Wt 175 lb (79.379 kg)  BMI 23.73 kg/m2  SpO2 97% Physical Exam  Constitutional: He is oriented to person, place, and time. He appears well-developed and well-nourished. No distress.  HENT:  Head: Normocephalic and atraumatic.  Eyes: Pupils are equal, round, and reactive to light.  Neck: Normal range of motion.  Cardiovascular: Normal rate and intact distal pulses.   Pulmonary/Chest: No respiratory distress.  Abdominal: Normal appearance. He exhibits no distension. There is tenderness.    Musculoskeletal: Normal range of motion.  Neurological: He is alert and oriented to person, place, and time. No cranial nerve deficit.  Skin: Skin is warm and dry. No rash noted.  Psychiatric: He has a normal mood and affect. His behavior is normal.  Nursing note and vitals reviewed.   ED Course  Procedures (including critical care time) Labs Review   Imaging Review No results found. I have personally reviewed and evaluated these images and lab results as part of my medical decision-making.   EKG Interpretation None       Results for orders placed or performed during the hospital encounter of 01/26/15  Comprehensive metabolic panel  Result Value Ref Range   Sodium 137 135 - 145 mmol/L   Potassium 4.3 3.5 - 5.1 mmol/L   Chloride 102 101 - 111  mmol/L   CO2 27 22 - 32 mmol/L   Glucose, Bld 105 (H) 65 - 99 mg/dL   BUN 7 6 - 20 mg/dL   Creatinine, Ser 1.61 0.61 - 1.24 mg/dL   Calcium 9.1 8.9 - 09.6 mg/dL   Total Protein 7.8 6.5 - 8.1 g/dL   Albumin 4.1 3.5 - 5.0 g/dL   AST 13 (L) 15 - 41 U/L   ALT 8 (L) 17 - 63 U/L   Alkaline Phosphatase 93 38 - 126 U/L   Total Bilirubin 0.7 0.3 - 1.2 mg/dL   GFR calc non Af Amer >60 >60 mL/min   GFR calc Af Amer >60 >60 mL/min   Anion gap 8 5 - 15  Lipase, blood  Result Value Ref Range   Lipase 16 (L) 22 - 51 U/L  CBC with Differential/Platelet  Result Value Ref Range   WBC 8.1  4.0 - 10.5 K/uL   RBC 4.07 (L) 4.22 - 5.81 MIL/uL   Hemoglobin 13.1 13.0 - 17.0 g/dL   HCT 04.5 40.9 - 81.1 %   MCV 96.3 78.0 - 100.0 fL   MCH 32.2 26.0 - 34.0 pg   MCHC 33.4 30.0 - 36.0 g/dL   RDW 91.4 78.2 - 95.6 %   Platelets 378 150 - 400 K/uL   Neutrophils Relative % 72 43 - 77 %   Neutro Abs 5.8 1.7 - 7.7 K/uL   Lymphocytes Relative 16 12 - 46 %   Lymphs Abs 1.3 0.7 - 4.0 K/uL   Monocytes Relative 11 3 - 12 %   Monocytes Absolute 0.9 0.1 - 1.0 K/uL   Eosinophils Relative 1 0 - 5 %   Eosinophils Absolute 0.1 0.0 - 0.7 K/uL   Basophils Relative 0 0 - 1 %   Basophils Absolute 0.0 0.0 - 0.1 K/uL  Comprehensive metabolic panel  Result Value Ref Range   Sodium 139 135 - 145 mmol/L   Potassium 4.4 3.5 - 5.1 mmol/L   Chloride 105 101 - 111 mmol/L   CO2 29 22 - 32 mmol/L   Glucose, Bld 107 (H) 65 - 99 mg/dL   BUN 6 6 - 20 mg/dL   Creatinine, Ser 2.13 0.61 - 1.24 mg/dL   Calcium 8.7 (L) 8.9 - 10.3 mg/dL   Total Protein 6.6 6.5 - 8.1 g/dL   Albumin 3.3 (L) 3.5 - 5.0 g/dL   AST 8 (L) 15 - 41 U/L   ALT 7 (L) 17 - 63 U/L   Alkaline Phosphatase 78 38 - 126 U/L   Total Bilirubin 0.6 0.3 - 1.2 mg/dL   GFR calc non Af Amer >60 >60 mL/min   GFR calc Af Amer >60 >60 mL/min   Anion gap 5 5 - 15  CBC  Result Value Ref Range   WBC 7.1 4.0 - 10.5 K/uL   RBC 3.74 (L) 4.22 - 5.81 MIL/uL   Hemoglobin 11.8 (L) 13.0 - 17.0 g/dL   HCT 08.6 (L) 57.8 - 46.9 %   MCV 97.9 78.0 - 100.0 fL   MCH 31.6 26.0 - 34.0 pg   MCHC 32.2 30.0 - 36.0 g/dL   RDW 62.9 52.8 - 41.3 %   Platelets 319 150 - 400 K/uL  Protime-INR  Result Value Ref Range   Prothrombin Time 14.3 11.6 - 15.2 seconds   INR 1.09 0.00 - 1.49   Ct Abdomen Pelvis W Contrast  02/02/2015   CLINICAL DATA:  Sudden onset right rib page status post trauma  to right ribs.  EXAM: CT ABDOMEN AND PELVIS WITH CONTRAST  TECHNIQUE: Multidetector CT imaging of the abdomen and pelvis was performed using the standard protocol following bolus  administration of intravenous contrast.  CONTRAST:  OMNIPAQUE IOHEXOL 300 MG/ML  SOLN  COMPARISON:  01/26/2015  FINDINGS: Lower chest: No pleural effusion identified. The lung bases appear clear. Again identified is an ovoid intermediate to high attenuation mass measuring 5 x 10 cm, image 12/ series 2. This appears centered around the right 6 chondral cartilage anteriorly. This is not significantly changed when compared with study from 01/26/2015. Again noted is a defect involving the right 6 chondral cartilage a, image 11/series 2. No associated rib fracture.  Hepatobiliary: There is no focal liver abnormality. The gallbladder appears normal. No biliary dilatation.  Pancreas: Normal appearance of the pancreas.  Spleen: The spleen is on unremarkable.  Adrenals/Urinary Tract: Normal appearance of the adrenal glands. Small bilateral renal cysts are noted. The urinary bladder appears normal.  Stomach/Bowel: The stomach is within normal limits. The small bowel loops have a normal course and caliber. No obstruction. Normal appearance of the colon. The appendix is visualized and appears normal.  Vascular/Lymphatic: Calcified atherosclerotic disease involves the abdominal aorta. No aneurysm. No enlarged retroperitoneal or mesenteric adenopathy. No enlarged pelvic or inguinal lymph nodes.  Reproductive: Prostate gland and seminal vesicles are negative.  Other: There is no ascites or focal fluid collections within the abdomen or pelvis.  Musculoskeletal: No acute bone abnormality identified. No rib fracture noted.  IMPRESSION: 1. No significant change in the size of ovoid intermediate to high attenuation mass within the ventral abdominal wall overlying the right upper quadrant. This appears centered around the right 6th chondral cartilage and there is associated chondral cartilage defect. In the acute setting this may represent a hematoma. Underlying mass cannot be excluded. If this does not resolve then further  imaging with contrast enhanced MRI would be recommended to assess for underlying mass. 2. Bilateral renal cysts.   Electronically Signed   By: Signa Kell M.D.   On: 02/02/2015 17:50   Ct Abdomen Pelvis W Contrast  01/26/2015   CLINICAL DATA:  Acute onset of right upper quadrant abdominal pain and swelling. Nausea and dry heaves. Initial encounter. Edit  EXAM: CT ABDOMEN AND PELVIS WITH CONTRAST  TECHNIQUE: Multidetector CT imaging of the abdomen and pelvis was performed using the standard protocol following bolus administration of intravenous contrast.  CONTRAST:  OMNIPAQUE IOHEXOL 300 MG/ML  SOLN  COMPARISON:  MRI of the lumbar spine performed 01/16/2014  FINDINGS: A trace right pleural effusion is noted.  There is an apparent unusual collection of relatively high attenuation material measuring 11.1 x 8.3 x 5.1 cm at the right upper quadrant anterior abdominal wall anterior to the hepatic dome, thought to reflect blood surrounding a fracture of the right sixth chondral cartilage. No acute contrast extravasation is seen. There is a defect at the right sixth chondral cartilage. Would monitor closely to ensure that this decreases in size over time, to exclude an underlying pathologic process.  Trace associated blood tracks into the epicardial fat pad and directly anterior to the liver. Trace blood extends inferiorly along the musculature of the right anterior abdominal wall.  The liver and spleen are unremarkable in appearance. The gallbladder is within normal limits. The pancreas and adrenal glands are unremarkable.  Scattered bilateral renal cysts are seen, measuring up to 1.4 cm in size. The kidneys are otherwise unremarkable. There is no evidence  of hydronephrosis. No renal or ureteral stones are seen.  No free fluid is identified. The small bowel is unremarkable in appearance. The stomach is within normal limits. No acute vascular abnormalities are seen. Minimal calcification is noted along the right  common iliac artery.  The appendix is normal in caliber and contains air, without evidence of appendicitis. The colon is unremarkable in appearance.  The bladder is moderately distended and grossly unremarkable. The prostate remains normal in size. No inguinal lymphadenopathy is seen.  No acute osseous abnormalities are identified.  IMPRESSION: 1. Unusual collection of relatively high attenuation material measuring 11.1 x 8.3 x 5.1 cm at the right upper quadrant anterior abdominal wall, anterior to the hepatic dome, thought to reflect blood surrounding a fracture of the right sixth chondral cartilage. No acute contrast extravasation seen. Associated defect at the right chondral cartilage. Would monitor closely to ensure that this collection decreases in size over time, to exclude an underlying pathologic process. 2. Trace associated fluid tracks into the epicardial fat pad and directly anterior to the liver, and inferiorly along the musculature of the right anterior abdominal wall. 3. Scattered bilateral renal cysts seen. These results were called by telephone at the time of interpretation on 01/26/2015 at 6:12 pm to Dr. Chilton Si, who verbally acknowledged these results.   Electronically Signed   By: Roanna Raider M.D.   On: 01/26/2015 18:12      MDM   Final diagnoses:  Chest wall hematoma, right, initial encounter        Nelva Nay, MD 02/08/15 [phone]

## 2015-03-02 ENCOUNTER — Emergency Department (HOSPITAL_COMMUNITY)
Admission: EM | Admit: 2015-03-02 | Discharge: 2015-03-02 | Disposition: A | Discharge status: Home / Self Care | Payer: Self-pay | Attending: Emergency Medicine | Admitting: Emergency Medicine

## 2015-03-02 ENCOUNTER — Encounter (HOSPITAL_COMMUNITY): Payer: Self-pay

## 2015-03-02 DIAGNOSIS — Z8781 Personal history of (healed) traumatic fracture: Secondary | ICD-10-CM | POA: Insufficient documentation

## 2015-03-02 DIAGNOSIS — Z720 Tobacco use: Secondary | ICD-10-CM | POA: Insufficient documentation

## 2015-03-02 DIAGNOSIS — L02213 Cutaneous abscess of chest wall: Secondary | ICD-10-CM | POA: Insufficient documentation

## 2015-03-02 DIAGNOSIS — L0291 Cutaneous abscess, unspecified: Secondary | ICD-10-CM

## 2015-03-02 DIAGNOSIS — F419 Anxiety disorder, unspecified: Secondary | ICD-10-CM | POA: Insufficient documentation

## 2015-03-02 DIAGNOSIS — Z79899 Other long term (current) drug therapy: Secondary | ICD-10-CM | POA: Insufficient documentation

## 2015-03-02 DIAGNOSIS — I10 Essential (primary) hypertension: Secondary | ICD-10-CM | POA: Insufficient documentation

## 2015-03-02 MED ORDER — OXYCODONE-ACETAMINOPHEN 5-325 MG PO TABS: 2.0000 | ORAL_TABLET | ORAL | Status: DC | PRN

## 2015-03-02 MED ORDER — OXYCODONE-ACETAMINOPHEN 5-325 MG PO TABS
2.0000 | ORAL_TABLET | Freq: Once | ORAL | Status: AC
  Administered 2015-03-02: 2 via ORAL
  Filled 2015-03-02: qty 2

## 2015-03-02 MED ORDER — CLINDAMYCIN HCL 300 MG PO CAPS: 300.0000 mg | ORAL_CAPSULE | Freq: Three times a day (TID) | ORAL | Status: DC

## 2015-03-02 MED ORDER — CLINDAMYCIN HCL 150 MG PO CAPS
300.0000 mg | ORAL_CAPSULE | Freq: Once | ORAL | Status: AC
  Administered 2015-03-02: 300 mg via ORAL
  Filled 2015-03-02: qty 2

## 2015-03-02 MED ORDER — LIDOCAINE-EPINEPHRINE (PF) 2 %-1:200000 IJ SOLN
INTRAMUSCULAR | Status: AC
  Filled 2015-03-02: qty 20

## 2015-03-02 MED ORDER — POVIDONE-IODINE 10 % EX SOLN
CUTANEOUS | Status: AC
  Filled 2015-03-02: qty 118

## 2015-03-02 NOTE — Discharge Instructions (Signed)
Remove 1 inch of gauze per day and reapply gauze dressing. For the gauze is completely out, start warm soaks 3 times per day with gentle massage. Recheck with your physician this week for reevaluation.   Abscess An abscess is an infected area that contains a collection of pus and debris.It can occur in almost any part of the body. An abscess is also known as a furuncle or boil. CAUSES  An abscess occurs when tissue gets infected. This can occur from blockage of oil or sweat glands, infection of hair follicles, or a minor injury to the skin. As the body tries to fight the infection, pus collects in the area and creates pressure under the skin. This pressure causes pain. People with weakened immune systems have difficulty fighting infections and get certain abscesses more often.  SYMPTOMS Usually an abscess develops on the skin and becomes a painful mass that is red, warm, and tender. If the abscess forms under the skin, you may feel a moveable soft area under the skin. Some abscesses break open (rupture) on their own, but most will continue to get worse without care. The infection can spread deeper into the body and eventually into the bloodstream, causing you to feel ill.  DIAGNOSIS  Your caregiver will take your medical history and perform a physical exam. A sample of fluid may also be taken from the abscess to determine what is causing your infection. TREATMENT  Your caregiver may prescribe antibiotic medicines to fight the infection. However, taking antibiotics alone usually does not cure an abscess. Your caregiver may need to make a small cut (incision) in the abscess to drain the pus. In some cases, gauze is packed into the abscess to reduce pain and to continue draining the area. HOME CARE INSTRUCTIONS   Only take over-the-counter or prescription medicines for pain, discomfort, or fever as directed by your caregiver.  If you were prescribed antibiotics, take them as directed. Finish them  even if you start to feel better.  If gauze is used, follow your caregiver's directions for changing the gauze.  To avoid spreading the infection:  Keep your draining abscess covered with a bandage.  Wash your hands well.  Do not share personal care items, towels, or whirlpools with others.  Avoid skin contact with others.  Keep your skin and clothes clean around the abscess.  Keep all follow-up appointments as directed by your caregiver. SEEK MEDICAL CARE IF:   You have increased pain, swelling, redness, fluid drainage, or bleeding.  You have muscle aches, chills, or a general ill feeling.  You have a fever. MAKE SURE YOU:   Understand these instructions.  Will watch your condition.  Will get help right away if you are not doing well or get worse. Document Released: 03/09/2005 Document Revised: 11/29/2011 Document Reviewed: 08/12/2011 [facility] Patient Information 2015 [place], [place]. This information is not intended to replace advice given to you by your health care provider. Make sure you discuss any questions you have with your health care provider.

## 2015-03-02 NOTE — ED Notes (Signed)
Pt reports was chasing his nephew and hit r ribs on the corner of his dresser.  Reports was evaluated here and was told he had several broken ribs.  Pt c/o severe pain and says can't take a deep breath due to pain.  Area red and swollen.

## 2015-03-06 LAB — CULTURE, ROUTINE-ABSCESS

## 2015-03-06 NOTE — ED Provider Notes (Signed)
CSN: [id number]     Arrival date & time 03/02/15  1726 History   First MD Initiated Contact with Patient 03/02/15 1737     Chief Complaint  Patient presents with  . rib pain       HPI  Patient presents for evaluation of rib pain. Seen and evaluated several weeks ago after having a rib injury or chasing his nephew. Had an ecchymotic area on his chest wall and an apparent costochondral injury. Admitted overnight for pain control. His continued symptoms. Has developed a localized swelling that is erythematous and increasingly painful over the last several days and presents here.  Past Medical History  Diagnosis Date  . Hypertension   . Anxiety   . Back pain   . Fracture of rib of right side 01/26/2015   Past Surgical History  Procedure Laterality Date  . Tee without cardioversion N/A 01/21/2014    Procedure: TRANSESOPHAGEAL ECHOCARDIOGRAM (TEE) with propofol;  Surgeon: Antoine Poche, MD;  Location: [facility];  Service: Endoscopy;  Laterality: N/A;   No family history on file. Social History  Substance Use Topics  . Smoking status: Current Every Day Smoker -- 0.50 packs/day    Types: Cigarettes  . Smokeless tobacco: None  . Alcohol Use: Yes     Comment: rare    Review of Systems  Constitutional: Negative for fever, chills, diaphoresis, appetite change and fatigue.  HENT: Negative for mouth sores, sore throat and trouble swallowing.   Eyes: Negative for visual disturbance.  Respiratory: Negative for cough, chest tightness, shortness of breath and wheezing.        Localized area of soft tissue swelling is erythematous and firm over the right inferior anterior to mid lateral lower costal margin.  Cardiovascular: Negative for chest pain.  Gastrointestinal: Negative for nausea, vomiting, abdominal pain, diarrhea and abdominal distention.  Endocrine: Negative for polydipsia, polyphagia and polyuria.  Genitourinary: Negative for dysuria, frequency and hematuria.  Musculoskeletal:  Negative for gait problem.  Skin: Negative for color change, pallor and rash.  Neurological: Negative for dizziness, syncope, light-headedness and headaches.  Hematological: Does not bruise/bleed easily.  Psychiatric/Behavioral: Negative for behavioral problems and confusion.      Allergies  Review of patient's allergies indicates no known allergies.  Home Medications   Prior to Admission medications   Medication Sig Start Date End Date Taking? Authorizing Topaz Raglin  acetaminophen (TYLENOL) 500 MG tablet Take 500 mg by mouth every 4 (four) hours as needed for mild pain or moderate pain.   Yes Historical Orie Baxendale, MD  ALPRAZolam Prudy Feeler) 1 MG tablet Take 1 mg by mouth 3 (three) times daily.    Yes Historical Giann Obara, MD  clindamycin (CLEOCIN) 300 MG capsule Take 1 capsule (300 mg total) by mouth 3 (three) times daily. 03/02/15   Rolland Porter, MD  guaiFENesin (MUCINEX) 600 MG 12 hr tablet Take 1 tablet (600 mg total) by mouth 2 (two) times daily. Patient not taking: Reported on 03/02/2015 01/27/15   Erick Blinks, MD  oxyCODONE-acetaminophen (PERCOCET/ROXICET) 5-325 MG per tablet Take 2 tablets by mouth every 4 (four) hours as needed. 03/02/15   Rolland Porter, MD   BP 136/92 mmHg  Pulse 113  Temp(Src) 98.1 F (36.7 C) (Oral)  Resp 18  Ht 6' (1.829 m)  Wt 185 lb (83.915 kg)  BMI 25.08 kg/m2  SpO2 99% Physical Exam  Constitutional: He is oriented to person, place, and time. He appears well-developed and well-nourished. No distress.  HENT:  Head: Normocephalic.  Eyes: Conjunctivae  are normal. Pupils are equal, round, and reactive to light. No scleral icterus.  Neck: Normal range of motion. Neck supple. No thyromegaly present.  Cardiovascular: Normal rate and regular rhythm.  Exam reveals no gallop and no friction rub.   No murmur heard. Pulmonary/Chest: Effort normal and breath sounds normal. No respiratory distress. He has no wheezes. He has no rales.    Abdominal: Soft. Bowel sounds  are normal. He exhibits no distension. There is no tenderness. There is no rebound.  Musculoskeletal: Normal range of motion.  Neurological: He is alert and oriented to person, place, and time.  Skin: Skin is warm and dry. No rash noted.  Psychiatric: He has a normal mood and affect. His behavior is normal.    ED Course  Procedures (including critical care time) Labs Review Labs Reviewed  CULTURE, ROUTINE-ABSCESS    Imaging Review No results found. I have personally reviewed and evaluated these images and lab results as part of my medical decision-making.   EKG Interpretation None      MDM   Final diagnoses:  Abscess   plan will be removal of the gauze in 40-72 hours. Gentle massage. I would like and be reevaluated between 2 and 3 days when he does remove the gauze to ensure that there is continued and adequate drainage and improvement of this abscess.  INCISION AND DRAINAGE Performed by: Claudean Kinds Consent: Verbal consent obtained. Risks and benefits: risks, benefits and alternatives were discussed Type: abscess  Body area: right chest wall  Anesthesia: local infiltration After local anesthesia and 18-gauge needle was introduced in the area of fluctuance and drains thick purulent fluid. Incision was made with a scalpel.  Local anesthetic: lidocaine 2% c epinephrine  Anesthetic total: 4 ml  Complexity: complex Blunt dissection to break up loculations  Drainage: purulent  Drainage amount: moiderate  Packing material: 1/4 in iodoform gauze  Patient tolerance: Patient tolerated the procedure well with no immediate complications.        Rolland Porter, MD 03/06/15 1013

## 2015-04-16 ENCOUNTER — Encounter (HOSPITAL_COMMUNITY): Payer: Self-pay | Admitting: Emergency Medicine

## 2015-04-16 ENCOUNTER — Emergency Department (HOSPITAL_COMMUNITY)
Admission: EM | Admit: 2015-04-16 | Discharge: 2015-04-16 | Disposition: A | Discharge status: Home / Self Care | Payer: Self-pay | Attending: Emergency Medicine | Admitting: Emergency Medicine

## 2015-04-16 DIAGNOSIS — Y9289 Other specified places as the place of occurrence of the external cause: Secondary | ICD-10-CM | POA: Insufficient documentation

## 2015-04-16 DIAGNOSIS — Z8781 Personal history of (healed) traumatic fracture: Secondary | ICD-10-CM | POA: Insufficient documentation

## 2015-04-16 DIAGNOSIS — W450XXA Nail entering through skin, initial encounter: Secondary | ICD-10-CM | POA: Insufficient documentation

## 2015-04-16 DIAGNOSIS — Y9389 Activity, other specified: Secondary | ICD-10-CM | POA: Insufficient documentation

## 2015-04-16 DIAGNOSIS — Z720 Tobacco use: Secondary | ICD-10-CM | POA: Insufficient documentation

## 2015-04-16 DIAGNOSIS — Z79899 Other long term (current) drug therapy: Secondary | ICD-10-CM | POA: Insufficient documentation

## 2015-04-16 DIAGNOSIS — I10 Essential (primary) hypertension: Secondary | ICD-10-CM | POA: Insufficient documentation

## 2015-04-16 DIAGNOSIS — S91332A Puncture wound without foreign body, left foot, initial encounter: Secondary | ICD-10-CM | POA: Insufficient documentation

## 2015-04-16 DIAGNOSIS — L02211 Cutaneous abscess of abdominal wall: Secondary | ICD-10-CM | POA: Insufficient documentation

## 2015-04-16 DIAGNOSIS — Z792 Long term (current) use of antibiotics: Secondary | ICD-10-CM | POA: Insufficient documentation

## 2015-04-16 DIAGNOSIS — F419 Anxiety disorder, unspecified: Secondary | ICD-10-CM | POA: Insufficient documentation

## 2015-04-16 DIAGNOSIS — Z87828 Personal history of other (healed) physical injury and trauma: Secondary | ICD-10-CM | POA: Insufficient documentation

## 2015-04-16 DIAGNOSIS — Z23 Encounter for immunization: Secondary | ICD-10-CM | POA: Insufficient documentation

## 2015-04-16 DIAGNOSIS — Y998 Other external cause status: Secondary | ICD-10-CM | POA: Insufficient documentation

## 2015-04-16 MED ORDER — CIPROFLOXACIN HCL 500 MG PO TABS: 500.0000 mg | ORAL_TABLET | Freq: Two times a day (BID) | ORAL | Status: DC

## 2015-04-16 MED ORDER — NAPROXEN 500 MG PO TABS: 500.0000 mg | ORAL_TABLET | Freq: Two times a day (BID) | ORAL | Status: DC

## 2015-04-16 MED ORDER — TETANUS-DIPHTH-ACELL PERTUSSIS 5-2.5-18.5 LF-MCG/0.5 IM SUSP
0.5000 mL | Freq: Once | INTRAMUSCULAR | Status: AC
  Administered 2015-04-16: 0.5 mL via INTRAMUSCULAR
  Filled 2015-04-16: qty 0.5

## 2015-04-16 MED ORDER — LIDOCAINE HCL (PF) 1 % IJ SOLN
5.0000 mL | Freq: Once | INTRAMUSCULAR | Status: DC
  Filled 2015-04-16: qty 5

## 2015-04-16 MED ORDER — OXYCODONE-ACETAMINOPHEN 5-325 MG PO TABS: 2.0000 | ORAL_TABLET | ORAL | Status: DC | PRN

## 2015-04-16 MED ORDER — HYDROGEN PEROXIDE 3 % EX SOLN
CUTANEOUS | Status: AC
  Filled 2015-04-16: qty 473

## 2015-04-16 MED ORDER — SULFAMETHOXAZOLE-TRIMETHOPRIM 200-40 MG/5ML PO SUSP
20.0000 mL | Freq: Once | ORAL | Status: AC
  Administered 2015-04-16: 20 mL via ORAL
  Filled 2015-04-16: qty 40

## 2015-04-16 MED ORDER — TRAMADOL HCL 50 MG PO TABS: 50.0000 mg | ORAL_TABLET | Freq: Four times a day (QID) | ORAL | Status: DC | PRN

## 2015-04-16 MED ORDER — SULFAMETHOXAZOLE-TRIMETHOPRIM 800-160 MG PO TABS: 1.0000 | ORAL_TABLET | Freq: Two times a day (BID) | ORAL | Status: AC

## 2015-04-16 MED ORDER — OXYCODONE-ACETAMINOPHEN 5-325 MG PO TABS
1.0000 | ORAL_TABLET | Freq: Once | ORAL | Status: AC
  Administered 2015-04-16: 1 via ORAL
  Filled 2015-04-16: qty 1

## 2015-04-16 MED ORDER — CIPROFLOXACIN HCL 250 MG PO TABS
500.0000 mg | ORAL_TABLET | Freq: Once | ORAL | Status: AC
  Administered 2015-04-16: 500 mg via ORAL
  Filled 2015-04-16: qty 2

## 2015-04-16 NOTE — ED Notes (Addendum)
Patient states he has an abscess on the upper abdominal area after rib injury. States he has been treated with antibiotics on September 18. States the pain never went away and the pain has worsened. No drainage noted from area. Also states he stepped on a rusty nail with his left foot en route to ED tonight.

## 2015-04-16 NOTE — ED Provider Notes (Signed)
The pt has had a recurrent area of swelling to the R ribs - there is a fluid collection on bedside [place] and it is tender but no redness or warmth, no fevers.  Needs drainage  EMERGENCY DEPARTMENT US SOFT TISSUE INTERPRETATION "Study: Limited Ultrasound of the noted body part in comments below"  INDICATIONS: Soft tissue infection Multiple views of the body part are obtained with a multi-frequency linear probe  PERFORMED BY:  Myself  IMAGES ARCHIVED?: Yes  SIDE:Right   BODY PART:Chest Wall  FINDINGS: Other fluid collection present  LIMITATIONS:  Body Habitus  INTERPRETATION:  Abcess present  COMMENT:  No cellulitis.  Medical screening examination/treatment/procedure(s) were conducted as a shared visit with non-physician practitioner(s) and myself.  I personally evaluated the patient during the encounter.  Clinical Impression:   Final diagnoses:  Abscess of skin of abdomen  Puncture wound to foot, left, initial encounter           Eber HongBrian Kelvon Giannini, MD 04/19/15 [phone]

## 2015-04-16 NOTE — ED Provider Notes (Signed)
CSN: [id number]     Arrival date & time 04/16/15  1856 History   First MD Initiated Contact with Patient 04/16/15 2026     Chief Complaint  Patient presents with  . Abscess     (Consider location/radiation/quality/duration/timing/severity/associated sxs/prior Treatment) Patient is a 39 y.o. male presenting with abscess. The history is provided by the patient.  Abscess Location:  Torso Torso abscess location:  Abd RUQ Size:  3 Abscess quality: fluctuance, painful and warmth  Draining:  cm.   Red streaking: no   Progression:  Worsening Pain details:    Quality:  Throbbing   Severity:  Severe Relieved by:  Nothing Risk factors: prior abscess    [name] [name] is a 39 y.o. male who presents to the ED with a tender raised area to the upper abdomen. Patient reports that he had a broken rib and hematoma of the right chest wall a few weeks ago. He reports having returned here 03/01/15 and treated with antibiotics after the area got worse. the area was infected and was opened and drained. He states that the pain never went away.   Patient also reports stepping on a nail on his way here to the ED it went through his rubber sole shoe and into his foot.   Past Medical History  Diagnosis Date  . Hypertension   . Anxiety   . Back pain   . Fracture of rib of right side 01/26/2015   Past Surgical History  Procedure Laterality Date  . Tee without cardioversion N/A 01/21/2014    Procedure: TRANSESOPHAGEAL ECHOCARDIOGRAM (TEE) with propofol;  Surgeon: Antoine Poche, MD;  Location: [facility];  Service: Endoscopy;  Laterality: N/A;   History reviewed. No pertinent family history. Social History  Substance Use Topics  . Smoking status: Current Every Day Smoker -- 0.50 packs/day    Types: Cigarettes  . Smokeless tobacco: None  . Alcohol Use: Yes     Comment: rare    Review of Systems  Skin: Positive for wound.       Puncture wound of the left foot Tender swollen area to the right chest  wall.   all other systems negative    Allergies  Review of patient's allergies indicates no known allergies.  Home Medications   Prior to Admission medications   Medication Sig Start Date End Date Taking? Authorizing Provider  acetaminophen (TYLENOL) 500 MG tablet Take 500 mg by mouth every 4 (four) hours as needed for mild pain or moderate pain.    Historical Provider, MD  ALPRAZolam Prudy Feeler) 1 MG tablet Take 1 mg by mouth 3 (three) times daily.     Historical Provider, MD  ciprofloxacin (CIPRO) 500 MG tablet Take 1 tablet (500 mg total) by mouth 2 (two) times daily. 04/16/15   Hope Orlene Och, NP  clindamycin (CLEOCIN) 300 MG capsule Take 1 capsule (300 mg total) by mouth 3 (three) times daily. 03/02/15   Rolland Porter, MD  guaiFENesin (MUCINEX) 600 MG 12 hr tablet Take 1 tablet (600 mg total) by mouth 2 (two) times daily. Patient not taking: Reported on 03/02/2015 01/27/15   Erick Blinks, MD  naproxen (NAPROSYN) 500 MG tablet Take 1 tablet (500 mg total) by mouth 2 (two) times daily. 04/16/15   Hope Orlene Och, NP  oxyCODONE-acetaminophen (PERCOCET/ROXICET) 5-325 MG tablet Take 2 tablets by mouth every 4 (four) hours as needed for severe pain. 04/16/15   Hope Orlene Och, NP  sulfamethoxazole-trimethoprim (BACTRIM DS,SEPTRA DS) 800-160 MG tablet Take  1 tablet by mouth 2 (two) times daily. 04/16/15 04/23/15  Hope Orlene OchM Neese, NP  traMADol (ULTRAM) 50 MG tablet Take 1 tablet (50 mg total) by mouth every 6 (six) hours as needed. 04/16/15   Hope Orlene OchM Neese, NP   BP 146/82 mmHg  Pulse 92  Temp(Src) 98.7 F (37.1 C) (Oral)  Resp 18  Ht 6' (1.829 m)  Wt 175 lb (79.379 kg)  BMI 23.73 kg/m2  SpO2 99% Physical Exam  Constitutional: He is oriented to person, place, and time. He appears well-developed and well-nourished.  HENT:  Head: Normocephalic and atraumatic.  Eyes: EOM are normal.  Neck: Neck supple.  Cardiovascular: Normal rate.   Pulmonary/Chest: Effort normal.    Tender, raised, red area to the right  chest wall.   Abdominal: There is tenderness.  Tender at area of abscess  Musculoskeletal: Normal range of motion.  Puncture wound to the plantar aspect of the left foot at the base of the 4th toe.   Neurological: He is alert and oriented to person, place, and time. No cranial nerve deficit.  Skin: Skin is warm and dry.  Psychiatric: He has a normal mood and affect. His behavior is normal.  Nursing note and vitals reviewed.   ED Course  .Marland Kitchen.Incision and Drainage Date/Time: 04/16/2015 9:15 PM Performed by: Janne NapoleonNEESE, HOPE M Authorized by: Janne NapoleonNEESE, HOPE M Consent: Verbal consent obtained. Risks and benefits: risks, benefits and alternatives were discussed Consent given by: patient Patient understanding: patient states understanding of the procedure being performed Required items: required blood products, implants, devices, and special equipment available Patient identity confirmed: verbally with patient Type: abscess Body area: trunk Location details: abdomen Local anesthetic: lidocaine 1% without epinephrine Anesthetic total: 4 ml Patient sedated: no Scalpel size: 11 Needle gauge: 22 Incision type: single straight Incision depth: dermal Complexity: simple Drainage: purulent Drainage amount: moderate Wound treatment: drain placed Packing material: 1/4 in iodoform gauze Patient tolerance: Patient tolerated the procedure well with no immediate complications    Left foot soaked in NSS with betadine and Peroxide to clean area of puncture site.  Tetanus updated MDM  39 y.o. male with abscess to the right upper abdomen and puncture wound to the left foot stable for d/c without fever and does not appear toxic. Will start Bactrim for the abscess and Cipro for the puncture wound. He will return for any problems. Discussed with the patient and all questioned fully answered.    Final diagnoses:  Abscess of skin of abdomen  Puncture wound to foot, left, initial encounter       [facility]  Neese, NP 04/16/15 2344  Eber HongBrian Miller, MD 04/19/15 [phone]

## 2015-04-16 NOTE — Discharge Instructions (Signed)
Abscess An abscess is an infected area that contains a collection of pus and debris.It can occur in almost any part of the body. An abscess is also known as a furuncle or boil. CAUSES  An abscess occurs when tissue gets infected. This can occur from blockage of oil or sweat glands, infection of hair follicles, or a minor injury to the skin. As the body tries to fight the infection, pus collects in the area and creates pressure under the skin. This pressure causes pain. People with weakened immune systems have difficulty fighting infections and get certain abscesses more often.  SYMPTOMS Usually an abscess develops on the skin and becomes a painful mass that is red, warm, and tender. If the abscess forms under the skin, you may feel a moveable soft area under the skin. Some abscesses break open (rupture) on their own, but most will continue to get worse without care. The infection can spread deeper into the body and eventually into the bloodstream, causing you to feel ill.  DIAGNOSIS  Your caregiver will take your medical history and perform a physical exam. A sample of fluid may also be taken from the abscess to determine what is causing your infection. TREATMENT  Your caregiver may prescribe antibiotic medicines to fight the infection. However, taking antibiotics alone usually does not cure an abscess. Your caregiver may need to make a small cut (incision) in the abscess to drain the pus. In some cases, gauze is packed into the abscess to reduce pain and to continue draining the area. HOME CARE INSTRUCTIONS   Only take over-the-counter or prescription medicines for pain, discomfort, or fever as directed by your caregiver.  If you were prescribed antibiotics, take them as directed. Finish them even if you start to feel better.  If gauze is used, follow your caregiver's directions for changing the gauze.  To avoid spreading the infection:  Keep your draining abscess covered with a  bandage.  Wash your hands well.  Do not share personal care items, towels, or whirlpools with others.  Avoid skin contact with others.  Keep your skin and clothes clean around the abscess.  Keep all follow-up appointments as directed by your caregiver. SEEK MEDICAL CARE IF:   You have increased pain, swelling, redness, fluid drainage, or bleeding.  You have muscle aches, chills, or a general ill feeling.  You have a fever. MAKE SURE YOU:   Understand these instructions.  Will watch your condition.  Will get help right away if you are not doing well or get worse.   This information is not intended to replace advice given to you by your health care provider. Make sure you discuss any questions you have with your health care provider.   Document Released: 03/09/2005 Document Revised: 11/29/2011 Document Reviewed: 08/12/2011 Elsevier Interactive Patient Education 2016 [employer]  Puncture Wound A puncture wound is an injury that is caused by a sharp, thin object that goes through (penetrates) your skin. Usually, a puncture wound does not leave a large opening in your skin, so it may not bleed a lot. However, when you get a puncture wound, dirt or other materials (foreign bodies) can be forced into your wound and break off inside. This increases the chance of infection, such as tetanus. CAUSES Puncture wounds are caused by any sharp, thin object that goes through your skin, such as:  Animal teeth, as with an animal bite.  Sharp, pointed objects, such as nails, splinters of glass, fishhooks, and needles. SYMPTOMS Symptoms  of a puncture wound include:  Pain.  Bleeding.  Swelling.  Bruising.  Fluid leaking from the wound.  Numbness, tingling, or loss of function. DIAGNOSIS This condition is diagnosed with a medical history and physical exam. Your wound will be checked to see if it contains any foreign bodies. You may also have X-rays or other imaging  tests. TREATMENT Treatment for a puncture wound depends on how serious the wound is. It also depends on whether the wound contains any foreign bodies. Treatment for all types of puncture wounds usually starts with:  Controlling the bleeding.  Washing out the wound with a germ-free (sterile) salt-water solution.  Checking the wound for foreign bodies. Treatment may also include:  Having the wound opened surgically to remove a foreign object.  Closing the wound with stitches (sutures) if it continues to bleed.  Covering the wound with antibiotic ointments and a bandage (dressing).  Receiving a tetanus shot.  Receiving a rabies vaccine. HOME CARE INSTRUCTIONS Medicines  Take or apply over-the-counter and prescription medicines only as told by your health care provider.  If you were prescribed an antibiotic, take or apply it as told by your health care provider. Do not stop using the antibiotic even if your condition improves. Wound Care  There are many ways to close and cover a wound. For example, a wound can be covered with sutures, skin glue, or adhesive strips. Follow instructions from your health care provider about:  How to take care of your wound.  When and how you should change your dressing.  When you should remove your dressing.  Removing whatever was used to close your wound.  Keep the dressing dry as told by your health care provider. Do not take baths, swim, use a hot tub, or do anything that would put your wound underwater until your health care provider approves.  Clean the wound as told by your health care provider.  Do not scratch or pick at the wound.  Check your wound every day for signs of infection. Watch for:  Redness, swelling, or pain.  Fluid, blood, or pus. General Instructions  Raise (elevate) the injured area above the level of your heart while you are sitting or lying down.  If your puncture wound is in your foot, ask your health care  provider if you need to avoid putting weight on your foot and for how long.  Keep all follow-up visits as told by your health care provider. This is important. SEEK MEDICAL CARE IF:  You received a tetanus shot and you have swelling, severe pain, redness, or bleeding at the injection site.  You have a fever.  Your sutures come out.  You notice a bad smell coming from your wound or your dressing.  You notice something coming out of your wound, such as wood or glass.  Your pain is not controlled with medicine.  You have increased redness, swelling, or pain at the site of your wound.  You have fluid, blood, or pus coming from your wound.  You notice a change in the color of your skin near your wound.  You need to change the dressing frequently due to fluid, blood, or pus draining from your wound.  You develop a new rash.  You develop numbness around your wound. SEEK IMMEDIATE MEDICAL CARE IF:  You develop severe swelling around your wound.  Your pain suddenly increases and is severe.  You develop painful skin lumps.  You have a red streak going away from your  wound.  The wound is on your hand or foot and you cannot properly move a finger or toe.  The wound is on your hand or foot and you notice that your fingers or toes look pale or bluish.   This information is not intended to replace advice given to you by your health care provider. Make sure you discuss any questions you have with your health care provider.   Document Released: 03/09/2005 Document Revised: 02/18/2015 Document Reviewed: 07/23/2014 Elsevier Interactive Patient Education [employer]

## 2015-04-20 MED FILL — Hydrocodone-Acetaminophen Tab 5-325 MG: ORAL | Qty: 6 | Status: AC

## 2016-05-09 IMAGING — CT CT ABD-PELV W/ CM
2 of 4 series · 14 of 46 positions shown, 16 images · IV contrast (Omnipaque 300)
Comparison: MRI of the lumbar spine performed 01/16/2014

CLINICAL DATA: Acute onset of right upper quadrant abdominal pain
and swelling. Nausea and dry heaves. Initial encounter. [place]

EXAM:
CT ABDOMEN AND PELVIS WITH CONTRAST
TECHNIQUE: Multidetector CT imaging of the abdomen and pelvis was performed
using the standard protocol following bolus administration of
intravenous contrast.
CONTRAST:  100mL OMNIPAQUE IOHEXOL 300 MG/ML  SOLN

[Series 2: abd_pel_with 5.0 b40f · axial · 0.71mm/px · z∈[-462,+3]mm · 11 of 108 slices shown, 13 images]
[im 10/108  soft-tissue]
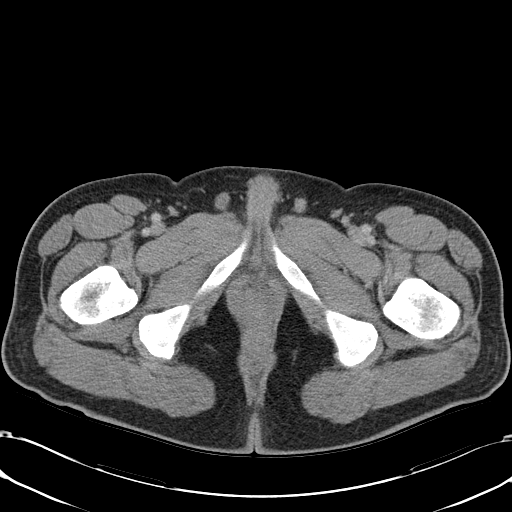
[im 10/108  bone]
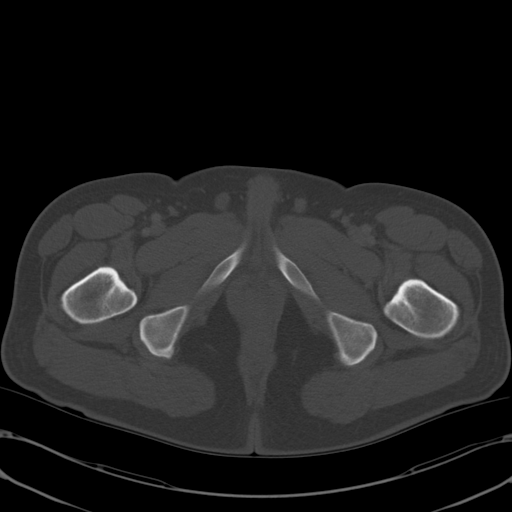
[im 19/108  soft-tissue]
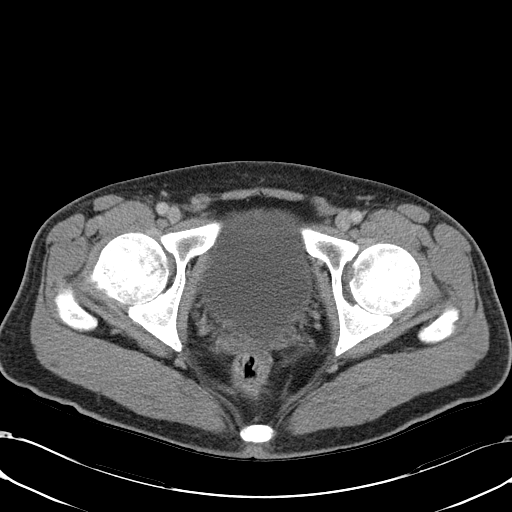
[im 28/108  soft-tissue]
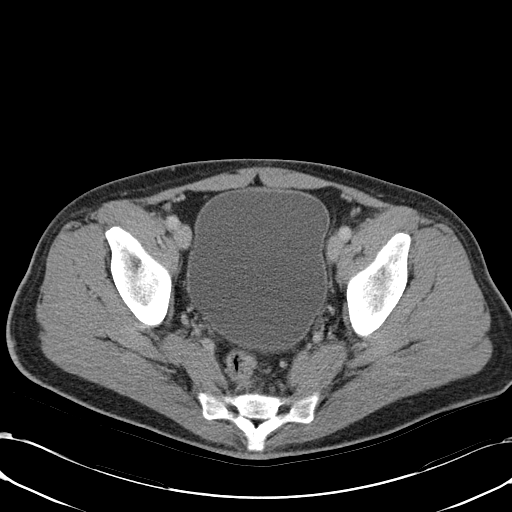
[im 38/108  soft-tissue]
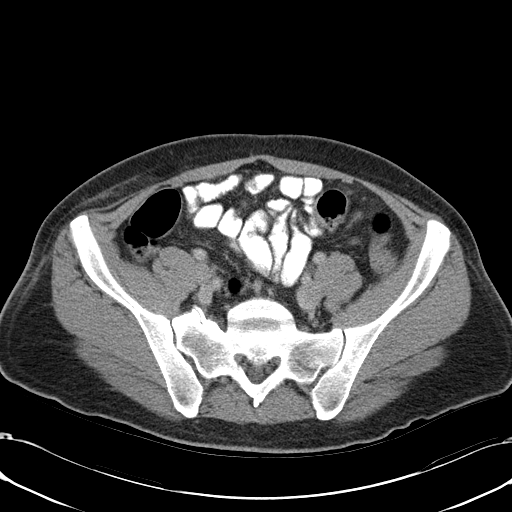
[im 47/108  soft-tissue]
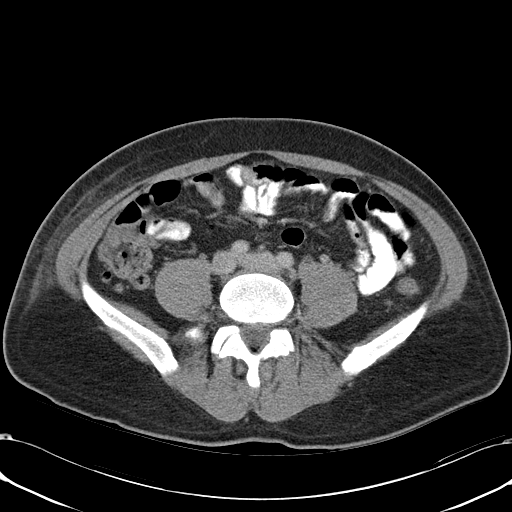
[im 56/108  soft-tissue]
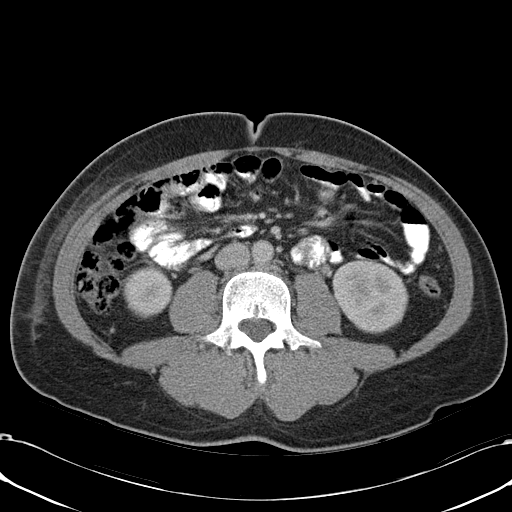
[im 66/108  soft-tissue]
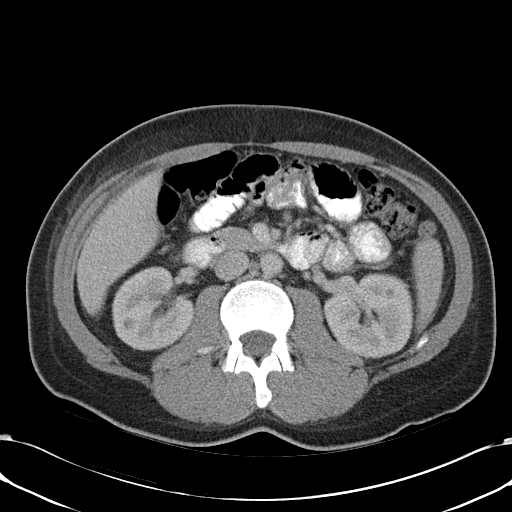
[im 75/108  soft-tissue]
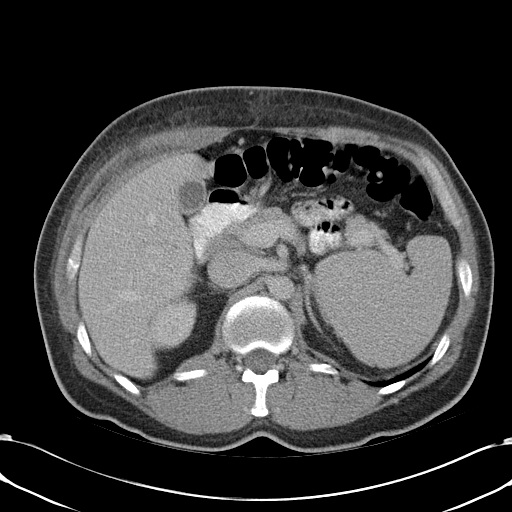
[im 84/108  soft-tissue]
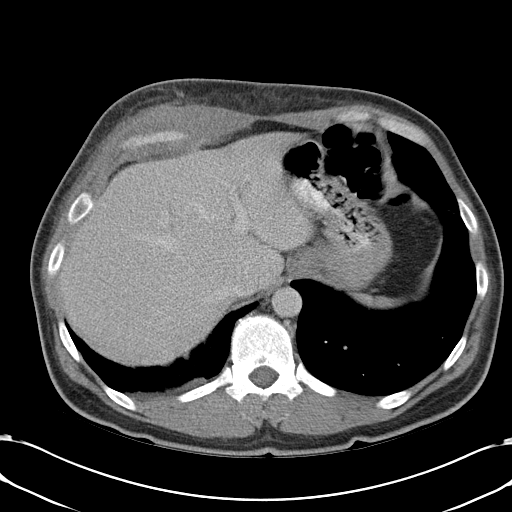
[im 84/108  bone]
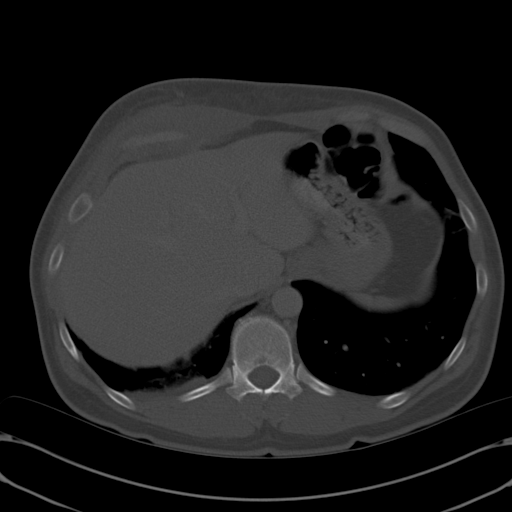
[im 94/108  soft-tissue]
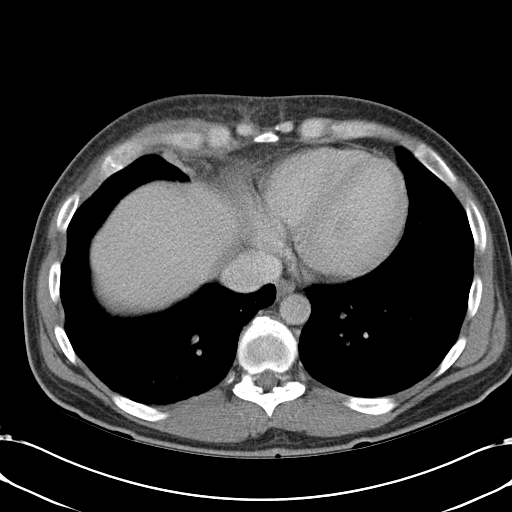
[im 103/108  soft-tissue]
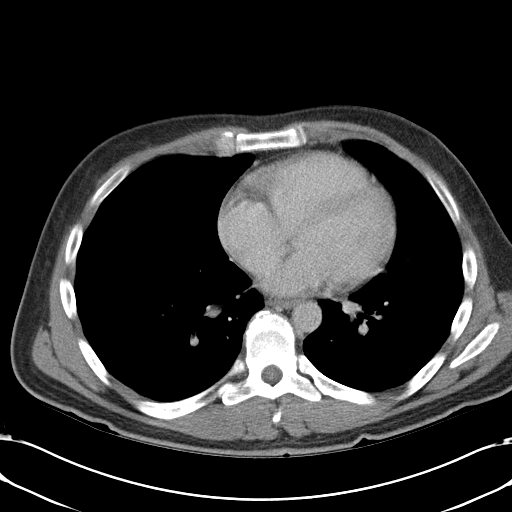

[Series 4: abd_pel_with 3.0 spo cor · coronal · 0.71mm/px · 3 of 80 slices shown]
[im 27/80  soft-tissue]
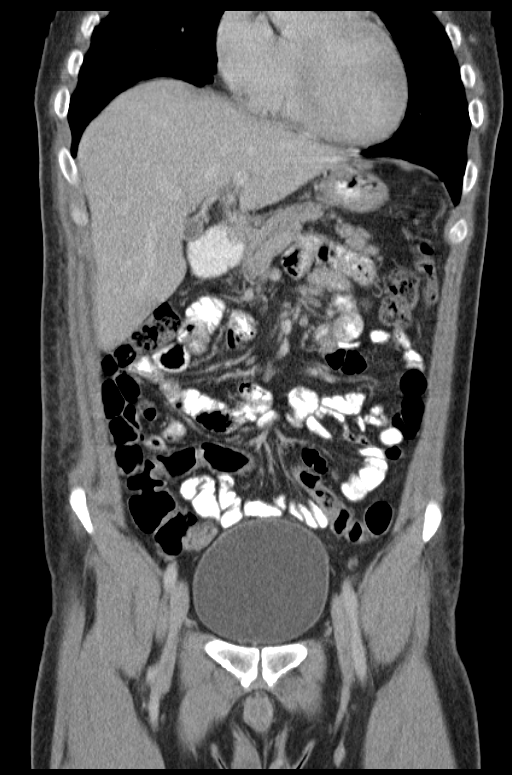
[im 36/80  soft-tissue]
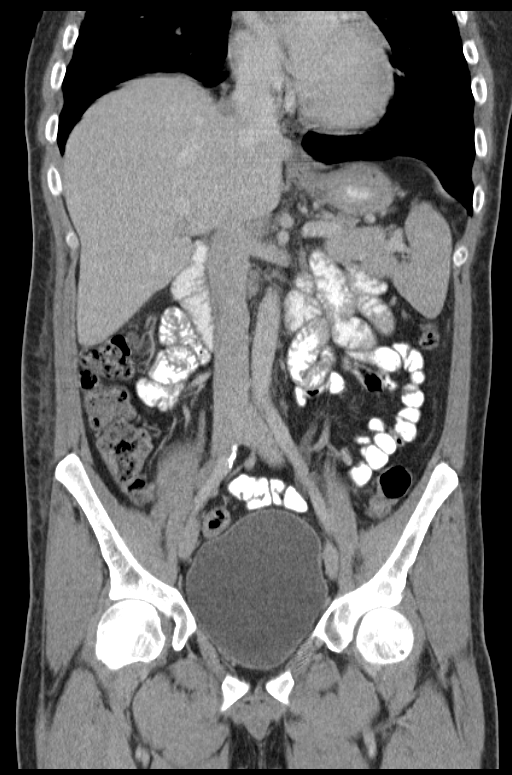
[im 44/80  soft-tissue]
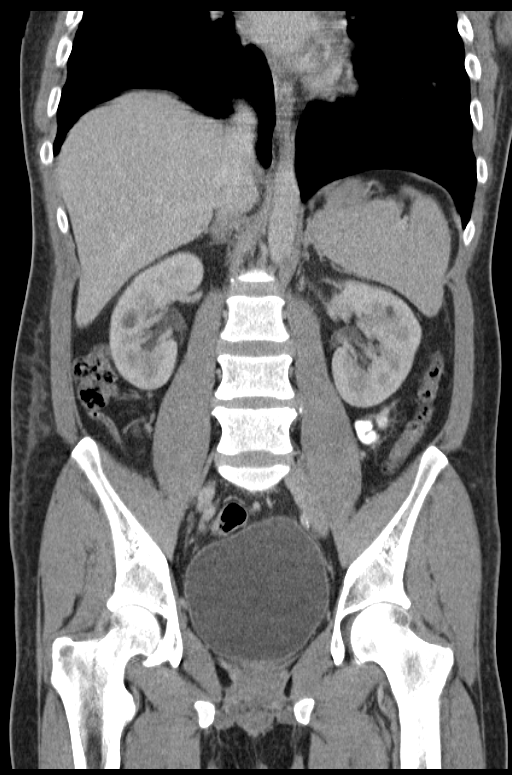

[14 of 46 positions shown; findings below may reference images not displayed]

FINDINGS: A trace right pleural effusion is noted.

There is an apparent unusual collection of relatively high
attenuation material measuring 11.1 x 8.3 x 5.1 cm at the right
upper quadrant anterior abdominal wall anterior to the hepatic dome,
thought to reflect blood surrounding a fracture of the right sixth
chondral cartilage. No acute contrast extravasation is seen. There
is a defect at the right sixth chondral cartilage. Would monitor
closely to ensure that this decreases in size over time, to exclude
an underlying pathologic process.

Trace associated blood tracks into the epicardial fat pad and
directly anterior to the liver. Trace blood extends inferiorly along
the musculature of the right anterior abdominal wall.

The liver and spleen are unremarkable in appearance. The gallbladder
is within normal limits. The pancreas and adrenal glands are
unremarkable.

Scattered bilateral renal cysts are seen, measuring up to 1.4 cm in
size. The kidneys are otherwise unremarkable. There is no evidence
of hydronephrosis. No renal or ureteral stones are seen.

No free fluid is identified. The small bowel is unremarkable in
appearance. The stomach is within normal limits. No acute vascular
abnormalities are seen. Minimal calcification is noted along the
right common iliac artery.

The appendix is normal in caliber and contains air, without evidence
of appendicitis. The colon is unremarkable in appearance.

The bladder is moderately distended and grossly unremarkable. The
prostate remains normal in size. No inguinal lymphadenopathy is
seen.

No acute osseous abnormalities are identified.
IMPRESSION: 1. Unusual collection of relatively high attenuation material
measuring 11.1 x 8.3 x 5.1 cm at the right upper quadrant anterior
abdominal wall, anterior to the hepatic dome, thought to reflect
blood surrounding a fracture of the right sixth chondral cartilage.
No acute contrast extravasation seen. Associated defect at the right
chondral cartilage. Would monitor closely to ensure that this
collection decreases in size over time, to exclude an underlying
pathologic process.
2. Trace associated fluid tracks into the epicardial fat pad and
directly anterior to the liver, and inferiorly along the musculature
of the right anterior abdominal wall.
3. Scattered bilateral renal cysts seen.
These results were called by telephone at the time of interpretation
on 01/26/2015 at [DATE] to Dr. Stanley, who verbally acknowledged
these results.

## 2016-11-07 ENCOUNTER — Encounter (HOSPITAL_COMMUNITY): Payer: Self-pay

## 2016-11-07 ENCOUNTER — Inpatient Hospital Stay (HOSPITAL_COMMUNITY)
Admission: EM | Admit: 2016-11-07 | Discharge: 2016-11-09 | DRG: 871 | Discharge status: Against Medical Advice | Payer: Self-pay | Attending: Family Medicine | Admitting: Family Medicine

## 2016-11-07 ENCOUNTER — Emergency Department (HOSPITAL_COMMUNITY): Payer: Self-pay

## 2016-11-07 DIAGNOSIS — J9600 Acute respiratory failure, unspecified whether with hypoxia or hypercapnia: Secondary | ICD-10-CM

## 2016-11-07 DIAGNOSIS — J189 Pneumonia, unspecified organism: Secondary | ICD-10-CM | POA: Diagnosis present

## 2016-11-07 DIAGNOSIS — R17 Unspecified jaundice: Secondary | ICD-10-CM | POA: Diagnosis present

## 2016-11-07 DIAGNOSIS — R1110 Vomiting, unspecified: Secondary | ICD-10-CM | POA: Diagnosis present

## 2016-11-07 DIAGNOSIS — D72819 Decreased white blood cell count, unspecified: Secondary | ICD-10-CM | POA: Diagnosis present

## 2016-11-07 DIAGNOSIS — Z79891 Long term (current) use of opiate analgesic: Secondary | ICD-10-CM

## 2016-11-07 DIAGNOSIS — F411 Generalized anxiety disorder: Secondary | ICD-10-CM

## 2016-11-07 DIAGNOSIS — F17210 Nicotine dependence, cigarettes, uncomplicated: Secondary | ICD-10-CM | POA: Diagnosis present

## 2016-11-07 DIAGNOSIS — G8929 Other chronic pain: Secondary | ICD-10-CM | POA: Diagnosis present

## 2016-11-07 DIAGNOSIS — F419 Anxiety disorder, unspecified: Secondary | ICD-10-CM | POA: Diagnosis present

## 2016-11-07 DIAGNOSIS — A419 Sepsis, unspecified organism: Principal | ICD-10-CM | POA: Diagnosis present

## 2016-11-07 DIAGNOSIS — J9601 Acute respiratory failure with hypoxia: Secondary | ICD-10-CM | POA: Diagnosis present

## 2016-11-07 DIAGNOSIS — J181 Lobar pneumonia, unspecified organism: Secondary | ICD-10-CM

## 2016-11-07 DIAGNOSIS — R197 Diarrhea, unspecified: Secondary | ICD-10-CM

## 2016-11-07 DIAGNOSIS — F1190 Opioid use, unspecified, uncomplicated: Secondary | ICD-10-CM | POA: Diagnosis present

## 2016-11-07 DIAGNOSIS — Z720 Tobacco use: Secondary | ICD-10-CM | POA: Diagnosis present

## 2016-11-07 LAB — COMPREHENSIVE METABOLIC PANEL
ALT: 27 U/L (ref 17–63)
AST: 53 U/L — ABNORMAL HIGH (ref 15–41)
Albumin: 3.5 g/dL (ref 3.5–5.0)
Alkaline Phosphatase: 44 U/L (ref 38–126)
Anion gap: 11 (ref 5–15)
BUN: 25 mg/dL — ABNORMAL HIGH (ref 6–20)
CALCIUM: 9.1 mg/dL (ref 8.9–10.3)
CHLORIDE: 98 mmol/L — AB (ref 101–111)
CO2: 26 mmol/L (ref 22–32)
CREATININE: 1.14 mg/dL (ref 0.61–1.24)
GFR calc Af Amer: >60 mL/min (ref >60)
Glucose, Bld: 102 mg/dL — ABNORMAL HIGH (ref 65–99)
Potassium: 4 mmol/L (ref 3.5–5.1)
Sodium: 135 mmol/L (ref 135–145)
Total Bilirubin: 2.3 mg/dL — ABNORMAL HIGH (ref 0.3–1.2)
Total Protein: 7.2 g/dL (ref 6.5–8.1)

## 2016-11-07 LAB — CBC
HCT: 40 % (ref 39.0–52.0)
Hemoglobin: 13.8 g/dL (ref 13.0–17.0)
MCH: 32.1 pg (ref 26.0–34.0)
MCHC: 34.5 g/dL (ref 30.0–36.0)
MCV: 93 fL (ref 78.0–100.0)
PLATELETS: 174 10*3/uL (ref 150–400)
RBC: 4.3 MIL/uL (ref 4.22–5.81)
RDW: 12.9 % (ref 11.5–15.5)
WBC: 1.9 10*3/uL — ABNORMAL LOW (ref 4.0–10.5)

## 2016-11-07 LAB — I-STAT CG4 LACTIC ACID, ED: Lactic Acid, Venous: 2.91 mmol/L (ref 0.5–1.9)

## 2016-11-07 LAB — URINALYSIS, ROUTINE W REFLEX MICROSCOPIC
BILIRUBIN URINE: NEGATIVE
Bacteria, UA: NONE SEEN
GLUCOSE, UA: NEGATIVE mg/dL
KETONES UR: NEGATIVE mg/dL
LEUKOCYTES UA: NEGATIVE
NITRITE: NEGATIVE
PH: 7 (ref 5.0–8.0)
PROTEIN: NEGATIVE mg/dL
Specific Gravity, Urine: 1.004 — ABNORMAL LOW (ref 1.005–1.030)
Squamous Epithelial / LPF: NONE SEEN

## 2016-11-07 LAB — APTT: aPTT: 36 seconds (ref 24–36)

## 2016-11-07 LAB — PROCALCITONIN: PROCALCITONIN: 14.96 ng/mL

## 2016-11-07 LAB — EXPECTORATED SPUTUM ASSESSMENT W REFEX TO RESP CULTURE

## 2016-11-07 LAB — RAPID URINE DRUG SCREEN, HOSP PERFORMED
AMPHETAMINES: NOT DETECTED
BARBITURATES: NOT DETECTED
BENZODIAZEPINES: POSITIVE — AB
Cocaine: NOT DETECTED
Opiates: POSITIVE — AB
Tetrahydrocannabinol: NOT DETECTED

## 2016-11-07 LAB — LIPASE, BLOOD: LIPASE: 12 U/L (ref 11–51)

## 2016-11-07 LAB — PROTIME-INR
INR: 1.09
Prothrombin Time: 14.1 seconds (ref 11.4–15.2)

## 2016-11-07 LAB — LACTIC ACID, PLASMA
LACTIC ACID, VENOUS: 2.9 mmol/L — AB (ref 0.5–1.9)
Lactic Acid, Venous: 2.2 mmol/L (ref 0.5–1.9)

## 2016-11-07 LAB — EXPECTORATED SPUTUM ASSESSMENT W GRAM STAIN, RFLX TO RESP C

## 2016-11-07 MED ORDER — SODIUM CHLORIDE 0.9 % IV BOLUS (SEPSIS)
1000.0000 mL | Freq: Once | INTRAVENOUS | Status: AC
  Administered 2016-11-07: 1000 mL via INTRAVENOUS

## 2016-11-07 MED ORDER — DEXTROSE 5 % IV SOLN
500.0000 mg | INTRAVENOUS | Status: DC
  Administered 2016-11-08: 500 mg via INTRAVENOUS
  Filled 2016-11-07 (×2): qty 500

## 2016-11-07 MED ORDER — ACETAMINOPHEN 325 MG PO TABS: 650.0000 mg | ORAL_TABLET | Freq: Four times a day (QID) | ORAL | Status: DC | PRN

## 2016-11-07 MED ORDER — GUAIFENESIN ER 600 MG PO TB12: 600.0000 mg | ORAL_TABLET | Freq: Two times a day (BID) | ORAL | Status: DC | PRN

## 2016-11-07 MED ORDER — ACETAMINOPHEN 500 MG PO TABS
1000.0000 mg | ORAL_TABLET | Freq: Once | ORAL | Status: AC
  Administered 2016-11-07: 1000 mg via ORAL
  Filled 2016-11-07: qty 2

## 2016-11-07 MED ORDER — SODIUM CHLORIDE 0.9 % IV BOLUS (SEPSIS)
500.0000 mL | Freq: Once | INTRAVENOUS | Status: AC
  Administered 2016-11-07: 500 mL via INTRAVENOUS

## 2016-11-07 MED ORDER — IPRATROPIUM-ALBUTEROL 0.5-2.5 (3) MG/3ML IN SOLN: 3.0000 mL | Freq: Once | RESPIRATORY_TRACT | Status: DC

## 2016-11-07 MED ORDER — SODIUM CHLORIDE 0.9 % IV BOLUS (SEPSIS): 1000.0000 mL | Freq: Once | INTRAVENOUS | Status: DC

## 2016-11-07 MED ORDER — ACETAMINOPHEN 650 MG RE SUPP: 650.0000 mg | Freq: Four times a day (QID) | RECTAL | Status: DC | PRN

## 2016-11-07 MED ORDER — NICOTINE 14 MG/24HR TD PT24
14.0000 mg | MEDICATED_PATCH | Freq: Every day | TRANSDERMAL | Status: DC
  Administered 2016-11-07 – 2016-11-09 (×3): 14 mg via TRANSDERMAL
  Filled 2016-11-07 (×3): qty 1

## 2016-11-07 MED ORDER — ALPRAZOLAM 0.5 MG PO TABS
1.0000 mg | ORAL_TABLET | Freq: Three times a day (TID) | ORAL | Status: DC
  Administered 2016-11-07 – 2016-11-09 (×6): 1 mg via ORAL
  Filled 2016-11-07 (×6): qty 2

## 2016-11-07 MED ORDER — LEVALBUTEROL HCL 1.25 MG/0.5ML IN NEBU
1.2500 mg | INHALATION_SOLUTION | Freq: Four times a day (QID) | RESPIRATORY_TRACT | Status: DC | PRN
  Administered 2016-11-08: 1.25 mg via RESPIRATORY_TRACT
  Filled 2016-11-07: qty 0.5

## 2016-11-07 MED ORDER — ONDANSETRON HCL 4 MG/2ML IJ SOLN
4.0000 mg | Freq: Once | INTRAMUSCULAR | Status: AC
  Administered 2016-11-07: 4 mg via INTRAVENOUS
  Filled 2016-11-07: qty 2

## 2016-11-07 MED ORDER — DEXTROSE 5 % IV SOLN
1.0000 g | INTRAVENOUS | Status: DC
  Administered 2016-11-08 – 2016-11-09 (×2): 1 g via INTRAVENOUS
  Filled 2016-11-07 (×3): qty 10

## 2016-11-07 MED ORDER — LEVALBUTEROL HCL 1.25 MG/0.5ML IN NEBU
1.2500 mg | INHALATION_SOLUTION | Freq: Once | RESPIRATORY_TRACT | Status: AC
  Administered 2016-11-07: 1.25 mg via RESPIRATORY_TRACT
  Filled 2016-11-07: qty 0.5

## 2016-11-07 MED ORDER — SODIUM CHLORIDE 0.9% FLUSH
3.0000 mL | Freq: Two times a day (BID) | INTRAVENOUS | Status: DC
  Administered 2016-11-07 – 2016-11-09 (×4): 3 mL via INTRAVENOUS

## 2016-11-07 MED ORDER — METHYLPREDNISOLONE SODIUM SUCC 125 MG IJ SOLR
125.0000 mg | Freq: Once | INTRAMUSCULAR | Status: AC
  Administered 2016-11-07: 125 mg via INTRAVENOUS
  Filled 2016-11-07: qty 2

## 2016-11-07 MED ORDER — ENOXAPARIN SODIUM 40 MG/0.4ML ~~LOC~~ SOLN
40.0000 mg | SUBCUTANEOUS | Status: DC
  Administered 2016-11-07 – 2016-11-08 (×2): 40 mg via SUBCUTANEOUS
  Filled 2016-11-07 (×2): qty 0.4

## 2016-11-07 MED ORDER — OXYCODONE-ACETAMINOPHEN 5-325 MG PO TABS
1.0000 | ORAL_TABLET | ORAL | Status: DC | PRN
  Administered 2016-11-07 – 2016-11-09 (×7): 2 via ORAL
  Filled 2016-11-07 (×7): qty 2

## 2016-11-07 MED ORDER — ONDANSETRON HCL 4 MG PO TABS: 4.0000 mg | ORAL_TABLET | Freq: Four times a day (QID) | ORAL | Status: DC | PRN

## 2016-11-07 MED ORDER — SODIUM CHLORIDE 0.9 % IV SOLN
INTRAVENOUS | Status: AC
  Administered 2016-11-08: 01:00:00 via INTRAVENOUS

## 2016-11-07 MED ORDER — DEXTROSE 5 % IV SOLN
1.0000 g | Freq: Once | INTRAVENOUS | Status: DC
  Filled 2016-11-07: qty 10

## 2016-11-07 MED ORDER — ONDANSETRON HCL 4 MG/2ML IJ SOLN
4.0000 mg | Freq: Four times a day (QID) | INTRAMUSCULAR | Status: DC | PRN
Start: 2016-11-07 — End: 2016-11-09

## 2016-11-07 MED ORDER — DEXTROSE 5 % IV SOLN
500.0000 mg | Freq: Once | INTRAVENOUS | Status: AC
  Administered 2016-11-07: 500 mg via INTRAVENOUS
  Filled 2016-11-07: qty 500

## 2016-11-07 NOTE — ED Provider Notes (Signed)
[facility]PT Provider Note   CSN: [id number] Arrival date & time: 11/07/16  1401     History   Chief Complaint Chief Complaint  Patient presents with  . Cough  . Diarrhea    HPI [name] [name] is a 41 y.o. male.  Pt presents to the ED today with diarrhea, vomiting, cough, and fever.  Pt's wife and son have had a cough.  The pt said sx have been going on for 4 days.  He did use his mom's albuterol today which has not helped.        Past Medical History:  Diagnosis Date  . Anxiety   . Back pain   . Fracture of rib of right side 01/26/2015  . Hypertension     Patient Active Problem List   Diagnosis Date Noted  . Chest wall hematoma 01/26/2015  . Fracture of rib of right side 01/26/2015  . Staph aureus infection 01/23/2014  . Bacteremia 01/18/2014  . Fever 01/18/2014  . Staphylococcus aureus bacteremia 01/18/2014  . CAP (community acquired pneumonia) 05/19/2013  . Sinus tachycardia 05/19/2013  . Tobacco abuse 05/19/2013    Past Surgical History:  Procedure Laterality Date  . TEE WITHOUT CARDIOVERSION N/A 01/21/2014   Procedure: TRANSESOPHAGEAL ECHOCARDIOGRAM (TEE) with propofol;  Surgeon: Antoine Poche, MD;  Location: [facility];  Service: Endoscopy;  Laterality: N/A;       Home Medications    Prior to Admission medications   Medication Sig Start Date End Date Taking? Authorizing Provider  acetaminophen (TYLENOL) 500 MG tablet Take 500 mg by mouth every 4 (four) hours as needed for mild pain or moderate pain.    [provider]  ALPRAZolam Prudy Feeler) 1 MG tablet Take 1 mg by mouth 3 (three) times daily.     [provider]  ciprofloxacin (CIPRO) 500 MG tablet Take 1 tablet (500 mg total) by mouth 2 (two) times daily. 04/16/15   Janne Napoleon, NP  clindamycin (CLEOCIN) 300 MG capsule Take 1 capsule (300 mg total) by mouth 3 (three) times daily. 03/02/15   Rolland Porter, MD  guaiFENesin (MUCINEX) 600 MG 12 hr tablet Take 1 tablet (600 mg  total) by mouth 2 (two) times daily. Patient not taking: Reported on 03/02/2015 01/27/15   Erick Blinks, MD  naproxen (NAPROSYN) 500 MG tablet Take 1 tablet (500 mg total) by mouth 2 (two) times daily. 04/16/15   Janne Napoleon, NP  oxyCODONE-acetaminophen (PERCOCET/ROXICET) 5-325 MG tablet Take 2 tablets by mouth every 4 (four) hours as needed for severe pain. 04/16/15   Janne Napoleon, NP  traMADol (ULTRAM) 50 MG tablet Take 1 tablet (50 mg total) by mouth every 6 (six) hours as needed. 04/16/15   Janne Napoleon, NP    Family History No family history on file.  Social History Social History  Substance Use Topics  . Smoking status: Current Every Day Smoker    Packs/day: 0.50    Types: Cigarettes  . Smokeless tobacco: Never Used  . Alcohol use Yes     Comment: rare     Allergies   Patient has no known allergies.   Review of Systems Review of Systems  Respiratory: Positive for cough and shortness of breath.   Gastrointestinal: Positive for nausea and vomiting.  All other systems reviewed and are negative.    Physical Exam Updated Vital Signs BP (!) 103/56   Pulse (!) 131   Temp 99.6 F (37.6 C) (Oral)   Resp 19  Ht 6' (1.829 m)   Wt 81.6 kg (180 lb)   SpO2 91%   BMI 24.41 kg/m   Physical Exam  Constitutional: He is oriented to person, place, and time. He appears well-developed. He appears distressed.  HENT:  Head: Normocephalic and atraumatic.  Right Ear: External ear normal.  Left Ear: External ear normal.  Nose: Nose normal.  Mouth/Throat: Mucous membranes are dry.  Eyes: Conjunctivae and EOM are normal. Pupils are equal, round, and reactive to light.  Neck: Normal range of motion. Neck supple.  Cardiovascular: Regular rhythm, normal heart sounds and intact distal pulses.  Tachycardia present.   Pulmonary/Chest: He has wheezes. He has rales.  Abdominal: Soft. Bowel sounds are normal.  Musculoskeletal: Normal range of motion.  Neurological: He is alert and  oriented to person, place, and time.  Skin: Skin is warm.  Psychiatric: He has a normal mood and affect. His behavior is normal. Judgment and thought content normal.  Nursing note and vitals reviewed.    ED Treatments / Results  Labs (all labs ordered are listed, but only abnormal results are displayed) Labs Reviewed  COMPREHENSIVE METABOLIC PANEL - Abnormal; Notable for the following:       Result Value   Chloride 98 (*)    Glucose, Bld 102 (*)    BUN 25 (*)    AST 53 (*)    Total Bilirubin 2.3 (*)    All other components within normal limits  CBC - Abnormal; Notable for the following:    WBC 1.9 (*)    All other components within normal limits  I-STAT CG4 LACTIC ACID, ED - Abnormal; Notable for the following:    Lactic Acid, Venous 2.91 (*)    All other components within normal limits  CULTURE, BLOOD (ROUTINE X 2)  CULTURE, BLOOD (ROUTINE X 2)  LIPASE, BLOOD  URINALYSIS, ROUTINE W REFLEX MICROSCOPIC    EKG  EKG Interpretation  Date/Time:  Monday Nov 07 2016 14:58:06 EDT Ventricular Rate:  133 PR Interval:    QRS Duration: 85 QT Interval:  271 QTC Calculation: 403 R Axis:   48 Text Interpretation:  Sinus tachycardia Ventricular premature complex Aberrant complex Left atrial enlargement Baseline wander in lead(s) V1 Confirmed by Jacalyn LefevreHaviland, Sajjad Honea [phone]([id number]) on 11/07/2016 3:07:28 PM       Radiology Dg Chest 2 View  Result Date: 11/07/2016 CLINICAL DATA:  Diarrhea, vomiting and fatigue since Tuesday, wife and son also sick, cough, smoker, hypertension EXAM: CHEST  2 VIEW COMPARISON:  05/18/2013 FINDINGS: Normal heart size, mediastinal contours and pulmonary vascularity. BILATERAL lower lobe consolidation RIGHT greater than LEFT compatible with pneumonia. Upper lungs clear. No pleural effusion or pneumothorax. Bones unremarkable. IMPRESSION: BILATERAL lower lobe consolidation RIGHT greater than LEFT consistent with pneumonia. Followup PA and lateral chest X-ray is recommended  in 3-4 weeks following trial of antibiotic therapy to ensure resolution and exclude underlying abnormalities including malignancy. Electronically Signed   By: Ulyses SouthwardMark  Boles M.D.   On: 11/07/2016 15:19    Procedures Procedures (including critical care time)  Medications Ordered in ED Medications  sodium chloride 0.9 % bolus 1,000 mL (1,000 mLs Intravenous New Bag/Given 11/07/16 1531)    And  sodium chloride 0.9 % bolus 1,000 mL (not administered)    And  sodium chloride 0.9 % bolus 500 mL (not administered)  cefTRIAXone (ROCEPHIN) 1 g in dextrose 5 % 50 mL IVPB (not administered)  azithromycin (ZITHROMAX) 500 mg in dextrose 5 % 250 mL IVPB (500 mg Intravenous New  Bag/Given 11/07/16 1609)  ondansetron (ZOFRAN) injection 4 mg (4 mg Intravenous Given 11/07/16 1530)  methylPREDNISolone sodium succinate (SOLU-MEDROL) 125 mg/2 mL injection 125 mg (125 mg Intravenous Given 11/07/16 1530)  levalbuterol (XOPENEX) nebulizer solution 1.25 mg (1.25 mg Nebulization Given 11/07/16 1523)  acetaminophen (TYLENOL) tablet 1,000 mg (1,000 mg Oral Given 11/07/16 1527)     Initial Impression / Assessment and Plan / ED Course  I have reviewed the triage vital signs and the nursing notes.  Pertinent labs & imaging results that were available during my care of the patient were reviewed by me and considered in my medical decision making (see chart for details).    This patient meets SIRS Criteria and may be septic. SIRS = Systemic Inflammatory Response Syndrome  Best Practice Recommends:   Notify the nurse immediately to increase monitoring of patient.   The recent clinical data is shown below. Vitals:   11/07/16 1500 11/07/16 1527 11/07/16 1531 11/07/16 1600  BP: 113/64  109/63 (!) 103/56  Pulse: (!) 131   (!) 131  Resp: 20   19  Temp:      TempSrc:      SpO2: (!) 88% 98%  91%  Weight:      Height:        Hr has remained the same.  BP has dropped, but he has not gotten all of his fluids yet.  Pt is  getting IV abx and full sepsis fluids.  Breathing has improved with oxygen and with neb.  CRITICAL CARE Performed by: Jacalyn Lefevre   Total critical care time: 30 minutes  Critical care time was exclusive of separately billable procedures and treating other patients.  Critical care was necessary to treat or prevent imminent or life-threatening deterioration.  Critical care was time spent personally by me on the following activities: development of treatment plan with patient and/or surrogate as well as nursing, discussions with consultants, evaluation of patient's response to treatment, examination of patient, obtaining history from patient or surrogate, ordering and performing treatments and interventions, ordering and review of laboratory studies, ordering and review of radiographic studies, pulse oximetry and re-evaluation of patient's condition.   Pt d/w hospitalist (Dr. Antionette Char) for admission.  Final Clinical Impressions(s) / ED Diagnoses   Final diagnoses:  Community acquired pneumonia of left lower lobe of lung (HCC)  Community acquired pneumonia of right lower lobe of lung (HCC)  Tobacco abuse  Sepsis, due to unspecified organism [facility])    New Prescriptions New Prescriptions   No medications on file     Jacalyn Lefevre, MD 11/08/16 [phone]

## 2016-11-07 NOTE — ED Notes (Signed)
hospitalist has been in

## 2016-11-07 NOTE — Progress Notes (Signed)
Pharmacy Antibiotic Note  Bernerd LimboDoyle T Hegarty is a 41 y.o. male admitted on 11/07/2016 with sepsis.  Pharmacy has been consulted for azithromycin and rocephin dosing.  Plan: Continue azithromycin 500 mg IV q24 hours and rocephin 1 gm IV q24 hours F/u cultures and clinical course  Height: 6' (182.9 cm) Weight: 180 lb (81.6 kg) IBW/kg (Calculated) : 77.6  Temp (24hrs), Avg:99.2 F (37.3 C), Min:98.7 F (37.1 C), Max:99.6 F (37.6 C)   Recent Labs Lab 11/07/16 1458 11/07/16 1618  WBC 1.9*  --   CREATININE 1.14  --   LATICACIDVEN  --  2.91*    Estimated Creatinine Clearance: 95.5 mL/min (by C-G formula based on SCr of 1.14 mg/dL).    No Known Allergies   Thank you for allowing pharmacy to be a part of this patient's care.  Woodfin GanjaSeay, Idara [place]side P[place]et 11/07/2016 5:17 PM

## 2016-11-07 NOTE — ED Triage Notes (Signed)
Reports of diarrhea, vomiting and fatigue since Tuesday. States son and wife have also been sick.

## 2016-11-07 NOTE — ED Notes (Signed)
No answer in lobby when called for triage 

## 2016-11-07 NOTE — H&P (Signed)
History and Physical    [name] [name] BOF:[id number] DOB: February 14, 1976 DOA: 11/07/2016  PCP: Elfredia Nevins, MD   Patient coming from: Home  Chief Complaint: Cough, SOB, malaise, fevers  HPI: WILIAM [name] is a 41 y.o. male with medical history significant for anxiety disorder and tobacco abuse, now presenting to the emergency department for evaluation of fevers, productive cough, and dyspnea. Patient reports that he had been in his usual state of health until 11/02/2016 when he developed fevers and cough. These symptoms persisted and he came accompanied by dyspnea that has progressively worsened and is now severe. Patient reports a cough productive of green and white sputum, but denies any chest pain or palpitations. He reports fevers as high as 103F for which she has taken Tylenol. He was using a relative's albuterol, but not finding any relief with this. He had a loose stool early in the course of the illness, but has had loss of appetite and is not having diarrhea or vomiting. There has not been any recent long distance travel, but the patient notes that his young son was sick with nasal congestion and upper respiratory symptoms a little over a week ago, but is now well. His wife also became ill shortly after the son, also with cough and upper respiratory symptoms, but she has started to improve.  ED Course: Upon arrival to the ED, patient is found to be afebrile, saturating 88% on room air, tachycardic to 140, and was soft but stable blood pressure. EKG features a sinus tachycardia with rate 133 and PVC. Chest x-ray is notable for bilateral lower lobe consolidation, right greater than left, consistent with pneumonia. Chemstrip panel is notable for BUN of 25 and total bilirubin of 2.3. CBC features a new leukopenia 1900 and lactic acid is elevated to 2.91. Blood cultures were obtained in the emergency department, 30 cc/kg NS bolus was given, and the patient was treated with Rocephin and  azithromycin. She was also given 125 mg IV Solu-Medrol. Tachycardia improves with fluid, blood pressure remains soft, patient remains dyspneic but not in acute distress, and he will be admitted to the stepdown unit for ongoing evaluation and management of sepsis secondary to pneumonia.  Review of Systems:  All other systems reviewed and apart from HPI, are negative.  Past Medical History:  Diagnosis Date  . Anxiety   . Back pain   . Fracture of rib of right side 01/26/2015  . Hypertension     Past Surgical History:  Procedure Laterality Date  . TEE WITHOUT CARDIOVERSION N/A 01/21/2014   Procedure: TRANSESOPHAGEAL ECHOCARDIOGRAM (TEE) with propofol;  Surgeon: Antoine Poche, MD;  Location: [facility];  Service: Endoscopy;  Laterality: N/A;     reports that he has been smoking Cigarettes.  He has been smoking about 0.50 packs per day. He has never used smokeless tobacco. He reports that he uses drugs, including Heroin. He reports that he does not drink alcohol.  No Known Allergies  History reviewed. No pertinent family history.   Prior to Admission medications   Medication Sig Start Date End Date Taking? Authorizing Provider  ALPRAZolam Prudy Feeler) 1 MG tablet Take 1 mg by mouth 3 (three) times daily.     [provider]  guaiFENesin (MUCINEX) 600 MG 12 hr tablet Take 1 tablet (600 mg total) by mouth 2 (two) times daily. Patient not taking: Reported on 03/02/2015 01/27/15   Erick Blinks, MD  oxyCODONE-acetaminophen (PERCOCET/ROXICET) 5-325 MG tablet Take 2 tablets by mouth every 4 (  four) hours as needed for severe pain. 04/16/15   Janne NapoleonNeese, Hope M, NP    Physical Exam: Vitals:   11/07/16 1531 11/07/16 1600 11/07/16 1630 11/07/16 1656  BP: 109/63 (!) 103/56 98/69 (!) 94/58  Pulse:  (!) 131 (!) 130 (!) 116  Resp:  19 (!) 36 (!) 24  Temp:    98.7 F (37.1 C)  TempSrc:    Oral  SpO2:  91% 94% 93%  Weight:      Height:          Constitutional: Mild distress with increased  WOB, appears uncomfortable, no diaphoresis or pallor.  Eyes: PERTLA, lids and conjunctivae normal ENMT: Mucous membranes are moist. Posterior pharynx clear of any exudate or lesions.   Neck: normal, supple, no masses, no thyromegaly Respiratory: Rhonchi at bilateral bases. Tachypnea. Accessory muscle recruitment. No pallor or cyanosis.   Cardiovascular: Rate ~120 and regular. No extremity edema. No significant JVD. Abdomen: No distension, no tenderness, no masses palpated. Bowel sounds normal.  Musculoskeletal: no clubbing / cyanosis. No joint deformity upper and lower extremities.   Skin: no significant rashes, lesions, ulcers. Warm, dry, well-perfused. Neurologic: CN 2-12 grossly intact. Sensation intact, DTR normal. Strength 5/5 in all 4 limbs.  Psychiatric: Alert and oriented x 3. Calm and cooperative.     Labs on Admission: I have personally reviewed following labs and imaging studies  CBC:  Recent Labs Lab 11/07/16 1458  WBC 1.9*  HGB 13.8  HCT 40.0  MCV 93.0  PLT 174   Basic Metabolic Panel:  Recent Labs Lab 11/07/16 1458  NA 135  K 4.0  CL 98*  CO2 26  GLUCOSE 102*  BUN 25*  CREATININE 1.14  CALCIUM 9.1   GFR: Estimated Creatinine Clearance: 95.5 mL/min (by C-G formula based on SCr of 1.14 mg/dL). Liver Function Tests:  Recent Labs Lab 11/07/16 1458  AST 53*  ALT 27  ALKPHOS 44  BILITOT 2.3*  PROT 7.2  ALBUMIN 3.5    Recent Labs Lab 11/07/16 1458  LIPASE 12   No results for input(s): AMMONIA in the last 168 hours. Coagulation Profile: No results for input(s): INR, PROTIME in the last 168 hours. Cardiac Enzymes: No results for input(s): CKTOTAL, CKMB, CKMBINDEX, TROPONINI in the last 168 hours. BNP (last 3 results) No results for input(s): PROBNP in the last 8760 hours. HbA1C: No results for input(s): HGBA1C in the last 72 hours. CBG: No results for input(s): GLUCAP in the last 168 hours. Lipid Profile: No results for input(s): CHOL,  HDL, LDLCALC, TRIG, CHOLHDL, LDLDIRECT in the last 72 hours. Thyroid Function Tests: No results for input(s): TSH, T4TOTAL, FREET4, T3FREE, THYROIDAB in the last 72 hours. Anemia Panel: No results for input(s): VITAMINB12, FOLATE, FERRITIN, TIBC, IRON, RETICCTPCT in the last 72 hours. Urine analysis:    Component Value Date/Time   COLORURINE YELLOW 01/23/2014 2043   APPEARANCEUR CLEAR 01/23/2014 2043   LABSPEC <1.005 (L) 01/23/2014 2043   PHURINE 5.5 01/23/2014 2043   GLUCOSEU NEGATIVE 01/23/2014 2043   HGBUR LARGE (A) 01/23/2014 2043   BILIRUBINUR NEGATIVE 01/23/2014 2043   KETONESUR NEGATIVE 01/23/2014 2043   PROTEINUR NEGATIVE 01/23/2014 2043   UROBILINOGEN 0.2 01/23/2014 2043   NITRITE NEGATIVE 01/23/2014 2043   LEUKOCYTESUR NEGATIVE 01/23/2014 2043   Sepsis Labs: @LABRCNTIP (procalcitonin:4,lacticidven:4) )No results found for this or any previous visit (from the past 240 hour(s)).   Radiological Exams on Admission: Dg Chest 2 View  Result Date: 11/07/2016 CLINICAL DATA:  Diarrhea, vomiting and fatigue  since Tuesday, wife and son also sick, cough, smoker, hypertension EXAM: CHEST  2 VIEW COMPARISON:  05/18/2013 FINDINGS: Normal heart size, mediastinal contours and pulmonary vascularity. BILATERAL lower lobe consolidation RIGHT greater than LEFT compatible with pneumonia. Upper lungs clear. No pleural effusion or pneumothorax. Bones unremarkable. IMPRESSION: BILATERAL lower lobe consolidation RIGHT greater than LEFT consistent with pneumonia. Followup PA and lateral chest X-ray is recommended in 3-4 weeks following trial of antibiotic therapy to ensure resolution and exclude underlying abnormalities including malignancy. Electronically Signed   By: Ulyses Southward M.D.   On: 11/07/2016 15:19    EKG: Independently reviewed. Sinus tachycardia (rate 133), PVC.  Assessment/Plan  1. Sepsis secondary to PNA; acute hypoxic respiratory failure   - Pt presents with 1 wk of worsening cough,  fever, and dyspnea - Found to be hypoxic with increased WOB, leukopenia to 1.9k, tachycardic to 140's, and with elevated lactate  - Blood cultures were obtained, 30 cc/kg NS bolus given, and empiric Rocephin and azithromycin started in ED  - Check sputum culture, check for strep pneumo antigen - Plan to continue supplemental O2, IVF hydration, current abx, prn nebs, antipyretics, mucolytics  2. Leukopenia  - WBC is 1,900 on admission, previously wnl  - Likely secondary to sepsis, treated as above  - Will trend    3. Anxiety disorder  - Pt reports this to be stable  - Continue current management with Xanax    4. Current smoker - Counseled toward cessation - Pt expresses readiness to quit  - Nicotine patch provided   5. Hyperbilirubinemia  - Total bilirubin 2.3 on admission without RUQ pain  - Likely secondary to sepsis, will repeat CMP in am    DVT prophylaxis: sq Lovenox  Code Status: Full  Family Communication: Wife updated at bedside Disposition Plan: Admit to SDU Consults called: None Admission status: Inpatient    Briscoe Deutscher, MD [facility] Pager [phone]  If 7PM-7AM, please contact night-coverage [email or web address] Password [email or web address]  11/07/2016, 5:09 PM

## 2016-11-07 NOTE — ED Notes (Signed)
Breathing tx in progress.

## 2016-11-08 LAB — RESPIRATORY PANEL BY PCR
Adenovirus: NOT DETECTED
BORDETELLA PERTUSSIS-RVPCR: NOT DETECTED
CHLAMYDOPHILA PNEUMONIAE-RVPPCR: NOT DETECTED
CORONAVIRUS 229E-RVPPCR: NOT DETECTED
CORONAVIRUS HKU1-RVPPCR: NOT DETECTED
Coronavirus NL63: NOT DETECTED
Coronavirus OC43: NOT DETECTED
Influenza A: NOT DETECTED
Influenza B: NOT DETECTED
MYCOPLASMA PNEUMONIAE-RVPPCR: NOT DETECTED
Metapneumovirus: NOT DETECTED
Parainfluenza Virus 1: NOT DETECTED
Parainfluenza Virus 2: NOT DETECTED
Parainfluenza Virus 3: NOT DETECTED
Parainfluenza Virus 4: NOT DETECTED
Respiratory Syncytial Virus: NOT DETECTED
Rhinovirus / Enterovirus: DETECTED — AB

## 2016-11-08 LAB — COMPREHENSIVE METABOLIC PANEL
ALBUMIN: 3 g/dL — AB (ref 3.5–5.0)
ALK PHOS: 41 U/L (ref 38–126)
ALT: 22 U/L (ref 17–63)
ANION GAP: 7 (ref 5–15)
AST: 34 U/L (ref 15–41)
BILIRUBIN TOTAL: 0.9 mg/dL (ref 0.3–1.2)
BUN: 20 mg/dL (ref 6–20)
CALCIUM: 8.8 mg/dL — AB (ref 8.9–10.3)
CO2: 25 mmol/L (ref 22–32)
CREATININE: 0.92 mg/dL (ref 0.61–1.24)
Chloride: 109 mmol/L (ref 101–111)
GFR calc Af Amer: >60 mL/min (ref >60)
GFR calc non Af Amer: >60 mL/min (ref >60)
GLUCOSE: 116 mg/dL — AB (ref 65–99)
Potassium: 4.1 mmol/L (ref 3.5–5.1)
Sodium: 141 mmol/L (ref 135–145)
TOTAL PROTEIN: 6.6 g/dL (ref 6.5–8.1)

## 2016-11-08 LAB — CBC WITH DIFFERENTIAL/PLATELET
BASOS ABS: 0 10*3/uL (ref 0.0–0.1)
Basophils Relative: 0 %
Eosinophils Absolute: 0 10*3/uL (ref 0.0–0.7)
Eosinophils Relative: 0 %
HEMATOCRIT: 38.1 % — AB (ref 39.0–52.0)
HEMOGLOBIN: 12.8 g/dL — AB (ref 13.0–17.0)
LYMPHS PCT: 15 %
Lymphs Abs: 0.3 10*3/uL — ABNORMAL LOW (ref 0.7–4.0)
MCH: 32.2 pg (ref 26.0–34.0)
MCHC: 33.6 g/dL (ref 30.0–36.0)
MCV: 96 fL (ref 78.0–100.0)
MONOS PCT: 2 %
Monocytes Absolute: 0 10*3/uL — ABNORMAL LOW (ref 0.1–1.0)
NEUTROS ABS: 1.8 10*3/uL (ref 1.7–7.7)
Neutrophils Relative %: 83 %
Platelets: 156 10*3/uL (ref 150–400)
RBC: 3.97 MIL/uL — ABNORMAL LOW (ref 4.22–5.81)
RDW: 13.3 % (ref 11.5–15.5)
WBC Morphology: INCREASED
WBC: 2.1 10*3/uL — ABNORMAL LOW (ref 4.0–10.5)

## 2016-11-08 LAB — MRSA PCR SCREENING: MRSA BY PCR: NEGATIVE

## 2016-11-08 LAB — GLUCOSE, CAPILLARY: Glucose-Capillary: 103 mg/dL — ABNORMAL HIGH (ref 65–99)

## 2016-11-08 LAB — LACTIC ACID, PLASMA: Lactic Acid, Venous: 1.2 mmol/L (ref 0.5–1.9)

## 2016-11-08 NOTE — Progress Notes (Signed)
PROGRESS NOTE    [name] [name]  NFA:[id number] DOB: [birth year] DOA: 11/07/2016 PCP: Elfredia NevinsFusco, Lawrence, MD   Brief Narrative:  [name] [name] is a 41 y.o. male with medical history significant for anxiety disorder and tobacco abuse, presented with fevers, productive cough, and dyspnea.  Cough productive of green and white sputum,, fevers as high as 103F.  No recent long distance travel. History of sick contacts son and a spouse. ED Course: Upon arrival to the ED, patient is found to be afebrile, saturating 88% on room air, tachycardic to 140, and was soft but stable blood pressure. EKG features a sinus tachycardia with rate 133 and PVC. Chest x-ray is notable for bilateral lower lobe consolidation, right greater than left, consistent with pneumonia. Chemstrip panel is notable for BUN of 25 and total bilirubin of 2.3. CBC features a new leukopenia 1900 and lactic acid is elevated to 2.91.   Assessment & Plan:   Principal Problem:   Sepsis due to pneumonia [facility](HCC) Active Problems:   CAP (community acquired pneumonia)   Tobacco abuse   Leukopenia   Acute respiratory failure with hypoxia (HCC)   Anxiety disorder   Hyperbilirubinemia  Sepsis secondary to PNA; acute hypoxic respiratory failure  - 1 wk of worsening cough, fever, and dyspnea, hypoxic with increased WOB, leukopenia to 1.9k, tachycardic to 140's, and with elevated lactate.- 2.2 >> 1.2. - Blood cultures pending  - Continue empiric Rocephin and azithromycin started in ED  - Follow-up sputum cultures - Resp Panel- rhino virus detected. - Cont prn nebs, antipyretics, mucolytics - Transfer to floor tomorrow  Anxiety disorder - stable  - Continue current management with Xanax    Current smoker  - Counseled toward cessation - Pt expresses readiness to quit  - Nicotine patch provided   Hyperbilirubinemia Total bilirubin 2.3 on admission without RUQ pain. Likely secondary to sepsis - Resolved Likely secondary to  sepsis  Chronic pain- on chronic opioids   DVT prophylaxis: sq Lovenox  Code Status: Full  Family Communication: Wife updated at bedside Disposition Plan: Admit to SDU Consults called: None Admission status: Inpatient   Consultants:   None   Procedures: None  Antimicrobials: (specify start and planned stop date. Auto populated tables are space occupying and do not give end dates)  Ceftriaxone and azithromycin 5/28 >>   Subjective: Patient feels better today. Feels better after bronchodils.  Objective: Vitals:   11/08/16 1500 11/08/16 1600 11/08/16 1700 11/08/16 1800  BP: (!) 123/92 (!) 131/96 (!) 129/95 126/89  Pulse: 91 (!) 101 (!) 102 (!) 101  Resp: (!) 24 10 18  (!) 0  Temp:  98.1 F (36.7 C)    TempSrc:  Oral    SpO2: 93% 92% 92% 92%  Weight:      Height:        Intake/Output Summary (Last 24 hours) at 11/08/16 2001 Last data filed at 11/08/16 1700  Gross per 24 hour  Intake             1020 ml  Output             2750 ml  Net            -1730 ml   Filed Weights   11/07/16 1442 11/07/16 1811 11/08/16 0500  Weight: 81.6 kg (180 lb) 72.8 kg (160 lb 7.9 oz) 74.5 kg (164 lb 3.9 oz)    Examination:  Constitutional: Mild distress with increased WOB, no diaphoresis or pallor., A temperature last Eyes:  lids and conjunctivae normal ENMT: Mucous membranes are moist. Posterior pharynx clear of any exudate or lesions.   Neck: normal, supple, no masses, no thyromegaly Respiratory:  crackles bases. Equal air entry. Congested breath sounds No pallor or cyanosis.   Cardiovascular:  tachycardic, regular. No extremity edema. No significant JVD. Abdomen: No distension, no tenderness, no masses palpated. Bowel sounds normal.  Musculoskeletal: no clubbing / cyanosis. No joint deformity upper and lower extremities.   Skin: no significant rashes, lesions, ulcers. Warm, dry, well-perfused. Neurologic: CN 2-12 grossly intact. Sensation intact, DTR normal. Strength 5/5 in all  4 limbs.  Psychiatric: Alert and oriented x 3. Calm and cooperative.    Data Reviewed: I have personally reviewed following labs and imaging studies  CBC:  Recent Labs Lab 11/07/16 1458 11/08/16 0455  WBC 1.9* 2.1*  NEUTROABS  --  1.8  HGB 13.8 12.8*  HCT 40.0 38.1*  MCV 93.0 96.0  PLT 174 156   Basic Metabolic Panel:  Recent Labs Lab 11/07/16 1458 11/08/16 0455  NA 135 141  K 4.0 4.1  CL 98* 109  CO2 26 25  GLUCOSE 102* 116*  BUN 25* 20  CREATININE 1.14 0.92  CALCIUM 9.1 8.8*   Liver Function Tests:  Recent Labs Lab 11/07/16 1458 11/08/16 0455  AST 53* 34  ALT 27 22  ALKPHOS 44 41  BILITOT 2.3* 0.9  PROT 7.2 6.6  ALBUMIN 3.5 3.0*    Recent Labs Lab 11/07/16 1458  LIPASE 12   Coagulation Profile:  Recent Labs Lab 11/07/16 1502  INR 1.09   CBG:  Recent Labs Lab 11/08/16 0359  GLUCAP 103*   Sepsis Labs:  Recent Labs Lab 11/07/16 1502 11/07/16 1618 11/07/16 1707 11/07/16 2139 11/08/16 0929  PROCALCITON 14.96  --   --   --   --   LATICACIDVEN  --  2.91* 2.9* 2.2* 1.2    Recent Results (from the past 240 hour(s))  Blood Culture (routine x 2)     Status: None (Preliminary result)   Collection Time: 11/07/16  3:56 PM  Result Value Ref Range Status   Specimen Description BLOOD LEFT HAND  Final   Special Requests   Final    BOTTLES DRAWN AEROBIC AND ANAEROBIC Blood Culture results may not be optimal due to an inadequate volume of blood received in culture bottles   Culture NO GROWTH < 24 HOURS  Final   Report Status PENDING  Incomplete  Blood Culture (routine x 2)     Status: None (Preliminary result)   Collection Time: 11/07/16  3:56 PM  Result Value Ref Range Status   Specimen Description LEFT ANTECUBITAL  Final   Special Requests   Final    BOTTLES DRAWN AEROBIC AND ANAEROBIC Blood Culture results may not be optimal due to an inadequate volume of blood received in culture bottles   Culture NO GROWTH < 24 HOURS  Final   Report  Status PENDING  Incomplete  Respiratory Panel by PCR     Status: Abnormal   Collection Time: 11/07/16  5:06 PM  Result Value Ref Range Status   Adenovirus NOT DETECTED NOT DETECTED Final   Coronavirus 229E NOT DETECTED NOT DETECTED Final   Coronavirus HKU1 NOT DETECTED NOT DETECTED Final   Coronavirus NL63 NOT DETECTED NOT DETECTED Final   Coronavirus OC43 NOT DETECTED NOT DETECTED Final   Metapneumovirus NOT DETECTED NOT DETECTED Final   Rhinovirus / Enterovirus DETECTED (A) NOT DETECTED Final   Influenza A NOT DETECTED  NOT DETECTED Final   Influenza B NOT DETECTED NOT DETECTED Final   Parainfluenza Virus 1 NOT DETECTED NOT DETECTED Final   Parainfluenza Virus 2 NOT DETECTED NOT DETECTED Final   Parainfluenza Virus 3 NOT DETECTED NOT DETECTED Final   Parainfluenza Virus 4 NOT DETECTED NOT DETECTED Final   Respiratory Syncytial Virus NOT DETECTED NOT DETECTED Final   Bordetella pertussis NOT DETECTED NOT DETECTED Final   Chlamydophila pneumoniae NOT DETECTED NOT DETECTED Final   Mycoplasma pneumoniae NOT DETECTED NOT DETECTED Final    Comment: Performed at [facility], [address] [place], [place] [address]  MRSA PCR Screening     Status: None   Collection Time: 11/07/16  6:30 PM  Result Value Ref Range Status   MRSA by PCR NEGATIVE NEGATIVE Final    Comment:        The GeneXpert MRSA Assay (FDA approved for NASAL specimens only), is one component of a comprehensive MRSA colonization surveillance program. It is not intended to diagnose MRSA infection nor to guide or monitor treatment for MRSA infections.   Culture, sputum-assessment     Status: None   Collection Time: 11/07/16 10:00 PM  Result Value Ref Range Status   Specimen Description EXPECTORATED SPUTUM  Final   Special Requests NONE  Final   Sputum evaluation THIS SPECIMEN IS ACCEPTABLE FOR SPUTUM CULTURE  Final   Report Status 11/07/2016 FINAL  Final  Culture, respiratory (NON-Expectorated)     Status:  None (Preliminary result)   Collection Time: 11/07/16 10:00 PM  Result Value Ref Range Status   Specimen Description EXPECTORATED SPUTUM  Final   Special Requests NONE Reflexed from [id number]  Final   Gram Stain   Final    FEW WBC PRESENT,BOTH PMN AND MONONUCLEAR RARE GRAM VARIABLE ROD FEW YEAST    Culture PENDING  Incomplete   Report Status PENDING  Incomplete    Radiology Studies: Dg Chest 2 View  Result Date: 11/07/2016 CLINICAL DATA:  Diarrhea, vomiting and fatigue since Tuesday, wife and son also sick, cough, smoker, hypertension EXAM: CHEST  2 VIEW COMPARISON:  05/18/2013 FINDINGS: Normal heart size, mediastinal contours and pulmonary vascularity. BILATERAL lower lobe consolidation RIGHT greater than LEFT compatible with pneumonia. Upper lungs clear. No pleural effusion or pneumothorax. Bones unremarkable. IMPRESSION: BILATERAL lower lobe consolidation RIGHT greater than LEFT consistent with pneumonia. Followup PA and lateral chest X-ray is recommended in 3-4 weeks following trial of antibiotic therapy to ensure resolution and exclude underlying abnormalities including malignancy. Electronically Signed   By: Ulyses Southward M.D.   On: 11/07/2016 15:19   Scheduled Meds: . ALPRAZolam  1 mg Oral TID  . enoxaparin (LOVENOX) injection  40 mg Subcutaneous Q24H  . nicotine  14 mg Transdermal Daily  . sodium chloride flush  3 mL Intravenous Q12H   Continuous Infusions: . azithromycin Stopped (11/08/16 1753)  . cefTRIAXone (ROCEPHIN)  IV    . cefTRIAXone (ROCEPHIN)  IV Stopped (11/08/16 1033)     LOS: 1 day   Brennin Durfee Wendall Stade, MD [facility] Pager [phone]  If 7PM-7AM, please contact night-coverage [email or web address] Password [email or web address] 11/08/2016, 8:01 PM

## 2016-11-09 DIAGNOSIS — J181 Lobar pneumonia, unspecified organism: Secondary | ICD-10-CM

## 2016-11-09 DIAGNOSIS — R197 Diarrhea, unspecified: Secondary | ICD-10-CM

## 2016-11-09 DIAGNOSIS — Z720 Tobacco use: Secondary | ICD-10-CM

## 2016-11-09 DIAGNOSIS — R1110 Vomiting, unspecified: Secondary | ICD-10-CM

## 2016-11-09 LAB — BASIC METABOLIC PANEL
ANION GAP: 6 (ref 5–15)
BUN: 25 mg/dL — ABNORMAL HIGH (ref 6–20)
CALCIUM: 9 mg/dL (ref 8.9–10.3)
CHLORIDE: 105 mmol/L (ref 101–111)
CO2: 29 mmol/L (ref 22–32)
Creatinine, Ser: 0.88 mg/dL (ref 0.61–1.24)
GFR calc non Af Amer: >60 mL/min (ref >60)
GLUCOSE: 92 mg/dL (ref 65–99)
POTASSIUM: 4 mmol/L (ref 3.5–5.1)
Sodium: 140 mmol/L (ref 135–145)

## 2016-11-09 LAB — CBC
HCT: 36.7 % — ABNORMAL LOW (ref 39.0–52.0)
Hemoglobin: 11.9 g/dL — ABNORMAL LOW (ref 13.0–17.0)
MCH: 31.4 pg (ref 26.0–34.0)
MCHC: 32.4 g/dL (ref 30.0–36.0)
MCV: 96.8 fL (ref 78.0–100.0)
Platelets: 154 10*3/uL (ref 150–400)
RBC: 3.79 MIL/uL — AB (ref 4.22–5.81)
RDW: 13.8 % (ref 11.5–15.5)
WBC: 4.3 10*3/uL (ref 4.0–10.5)

## 2016-11-09 LAB — HIV ANTIBODY (ROUTINE TESTING W REFLEX): HIV Screen 4th Generation wRfx: NONREACTIVE

## 2016-11-09 NOTE — Progress Notes (Signed)
PROGRESS NOTE    [name] LimboDoyle T Rusnak  QMV:[id number] DOB: May 16, 1976 DOA: 11/07/2016 PCP: Elfredia NevinsFusco, Lawrence, MD    Brief Narrative:  [name] [name] is a 41 y.o. male with medical history significant for anxiety disorder and tobacco abuse, now presenting to the emergency department for evaluation of fevers, productive cough, and dyspnea. Patient reports that he had been in his usual state of health until 11/02/2016 when he developed fevers and cough. These symptoms persisted and he came accompanied by dyspnea that has progressively worsened and is now severe. Patient reports a cough productive of green and white sputum, but denies any chest pain or palpitations. He reports fevers as high as 103F for which she has taken Tylenol. He was using a relative's albuterol, but not finding any relief with this. He had a loose stool early in the course of the illness, but has had loss of appetite and is not having diarrhea or vomiting. There has not been any recent long distance travel, but the patient notes that his young son was sick with nasal congestion and upper respiratory symptoms a little over a week ago, but is now well. His wife also became ill shortly after the son, also with cough and upper respiratory symptoms, but she has started to improve.   Assessment & Plan:   Principal Problem:   Sepsis due to pneumonia [facility](HCC) Active Problems:   CAP (community acquired pneumonia)   Tobacco abuse   Leukopenia   Acute respiratory failure with hypoxia (HCC)   Anxiety disorder   Hyperbilirubinemia   Sepsis secondary to PNA; acute hypoxic respiratory failure   - Blood cultures showing NG < 24 hours - s/p 30 cc/kg NS bolus given - continue Rocephin and azithromycin started in ED  - will need repeat CXR in 3-4 weeks to ensure resolution of pneumonia - Sputum culture pending - Strep urine antigen pending - Rhinovirus positive - add flutter valve and IS - continue antipyretics and  mucolytics  Leukopenia  - WBC is 1,900 on admission - improved today to 4.3  Anxiety disorder  - Continue current management with Xanax    Current smoker - Discussed cessation - Nicotine patch provided   Chronic Pain - on chronic opiates outpatient  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  Per patient he has been coughing a significant amount of sputum but cannot distinguish what it looked like.  Denies chest pain, chest pressure, able to speak in sentences.  No leg swelling.  No fevers in past 24 hours.  Objective: Vitals:   11/09/16 0200 11/09/16 0400 11/09/16 0500 11/09/16 0715  BP:      Pulse: 77   74  Resp: (!) 23   (!) 22  Temp:  98.9 F (37.2 C)  98 F (36.7 C)  TempSrc:  Oral  Oral  SpO2: 96%   97%  Weight:   74.5 kg (164 lb 3.9 oz)   Height:        Intake/Output Summary (Last 24 hours) at 11/09/16 0835 Last data filed at 11/08/16 1700  Gross per 24 hour  Intake              780 ml  Output              950 ml  Net             -170 ml   Filed Weights   11/07/16 1811 11/08/16 0500 11/09/16 0500  Weight: 72.8 kg (160 lb 7.9 oz) 74.5 kg (164 lb 3.9 oz) 74.5 kg (164 lb 3.9 oz)    Examination:  General exam: Appears in mild distress Respiratory system: increased respiratory effort, crackles in bases of lungs bilaterally, good air movement Cardiovascular system: S1 & S2 heard, RRR. No JVD, murmurs, rubs, gallops or clicks. No pedal edema. Gastrointestinal system: Abdomen is nondistended, soft and nontender. No  organomegaly or masses felt. Normal bowel sounds heard. Central nervous system: Alert and oriented. No focal neurological deficits. Extremities: Symmetric 5 x 5 power. Skin: No rashes, lesions or ulcers Psychiatry: Judgement and insight appear normal. Mood & affect appropriate.     Data Reviewed: I have personally reviewed following labs and imaging studies  CBC:  Recent Labs Lab 11/07/16 1458 11/08/16 0455 11/09/16 0421  WBC 1.9* 2.1* 4.3  NEUTROABS  --  1.8  --   HGB 13.8 12.8* 11.9*  HCT 40.0 38.1* 36.7*  MCV 93.0 96.0 96.8  PLT 174 156 154   Basic Metabolic Panel:  Recent Labs Lab 11/07/16 1458 11/08/16 0455 11/09/16 0421  NA 135 141 140  K 4.0 4.1 4.0  CL 98* 109 105  CO2 26 25 29   GLUCOSE 102* 116* 92  BUN 25* 20 25*  CREATININE 1.14 0.92 0.88  CALCIUM 9.1 8.8* 9.0   GFR: Estimated Creatinine Clearance: 118.8 mL/min (by C-G formula based on SCr of 0.88 mg/dL). Liver Function Tests:  Recent Labs Lab 11/07/16 1458 11/08/16 0455  AST 53* 34  ALT 27 22  ALKPHOS 44 41  BILITOT 2.3* 0.9  PROT 7.2 6.6  ALBUMIN 3.5 3.0*    Recent Labs Lab 11/07/16 1458  LIPASE 12   No results for input(s): AMMONIA in the last 168 hours. Coagulation Profile:  Recent Labs Lab 11/07/16 1502  INR 1.09   Cardiac Enzymes: No results for input(s): CKTOTAL, CKMB, CKMBINDEX, TROPONINI in the last 168 hours. BNP (last 3 results) No results for input(s): PROBNP in the last 8760 hours. HbA1C: No results for input(s): HGBA1C in the last 72 hours. CBG:  Recent Labs Lab 11/08/16 0359  GLUCAP 103*   Lipid Profile: No results for input(s): CHOL, HDL, LDLCALC, TRIG, CHOLHDL, LDLDIRECT in the last 72 hours. Thyroid Function Tests: No results for input(s): TSH, T4TOTAL, FREET4, T3FREE, THYROIDAB in the last 72 hours. Anemia Panel: No results for input(s): VITAMINB12, FOLATE, FERRITIN, TIBC, IRON, RETICCTPCT in the last 72 hours. Sepsis Labs:  Recent Labs Lab  11/07/16 1502 11/07/16 1618 11/07/16 1707 11/07/16 2139 11/08/16 0929  PROCALCITON 14.96  --   --   --   --   LATICACIDVEN  --  2.91* 2.9* 2.2* 1.2    Recent Results (from the past 240 hour(s))  Blood Culture (routine x 2)     Status: None (Preliminary result)   Collection Time: 11/07/16  3:56 PM  Result Value Ref Range Status   Specimen Description BLOOD LEFT HAND  Final   Special Requests   Final    BOTTLES DRAWN AEROBIC AND ANAEROBIC Blood Culture results may not be optimal due to an inadequate volume  of blood received in culture bottles   Culture NO GROWTH < 24 HOURS  Final   Report Status PENDING  Incomplete  Blood Culture (routine x 2)     Status: None (Preliminary result)   Collection Time: 11/07/16  3:56 PM  Result Value Ref Range Status   Specimen Description LEFT ANTECUBITAL  Final   Special Requests   Final    BOTTLES DRAWN AEROBIC AND ANAEROBIC Blood Culture results may not be optimal due to an inadequate volume of blood received in culture bottles   Culture NO GROWTH < 24 HOURS  Final   Report Status PENDING  Incomplete  Respiratory Panel by PCR     Status: Abnormal   Collection Time: 11/07/16  5:06 PM  Result Value Ref Range Status   Adenovirus NOT DETECTED NOT DETECTED Final   Coronavirus 229E NOT DETECTED NOT DETECTED Final   Coronavirus HKU1 NOT DETECTED NOT DETECTED Final   Coronavirus NL63 NOT DETECTED NOT DETECTED Final   Coronavirus OC43 NOT DETECTED NOT DETECTED Final   Metapneumovirus NOT DETECTED NOT DETECTED Final   Rhinovirus / Enterovirus DETECTED (A) NOT DETECTED Final   Influenza A NOT DETECTED NOT DETECTED Final   Influenza B NOT DETECTED NOT DETECTED Final   Parainfluenza Virus 1 NOT DETECTED NOT DETECTED Final   Parainfluenza Virus 2 NOT DETECTED NOT DETECTED Final   Parainfluenza Virus 3 NOT DETECTED NOT DETECTED Final   Parainfluenza Virus 4 NOT DETECTED NOT DETECTED Final   Respiratory Syncytial Virus NOT DETECTED NOT DETECTED Final    Bordetella pertussis NOT DETECTED NOT DETECTED Final   Chlamydophila pneumoniae NOT DETECTED NOT DETECTED Final   Mycoplasma pneumoniae NOT DETECTED NOT DETECTED Final    Comment: Performed at [facility], [address], [place], [place] [address]  MRSA PCR Screening     Status: None   Collection Time: 11/07/16  6:30 PM  Result Value Ref Range Status   MRSA by PCR NEGATIVE NEGATIVE Final    Comment:        The GeneXpert MRSA Assay (FDA approved for NASAL specimens only), is one component of a comprehensive MRSA colonization surveillance program. It is not intended to diagnose MRSA infection nor to guide or monitor treatment for MRSA infections.   Culture, sputum-assessment     Status: None   Collection Time: 11/07/16 10:00 PM  Result Value Ref Range Status   Specimen Description EXPECTORATED SPUTUM  Final   Special Requests NONE  Final   Sputum evaluation THIS SPECIMEN IS ACCEPTABLE FOR SPUTUM CULTURE  Final   Report Status 11/07/2016 FINAL  Final  Culture, respiratory (NON-Expectorated)     Status: None (Preliminary result)   Collection Time: 11/07/16 10:00 PM  Result Value Ref Range Status   Specimen Description EXPECTORATED SPUTUM  Final   Special Requests NONE Reflexed from [id number]  Final   Gram Stain   Final    FEW WBC PRESENT,BOTH PMN AND MONONUCLEAR RARE GRAM VARIABLE ROD FEW YEAST    Culture PENDING  Incomplete   Report Status PENDING  Incomplete         Radiology Studies: Dg Chest 2 View  Result Date: 11/07/2016 CLINICAL DATA:  Diarrhea, vomiting and fatigue since Tuesday, wife and son also sick, cough, smoker, hypertension EXAM: CHEST  2 VIEW COMPARISON:  05/18/2013 FINDINGS: Normal heart size, mediastinal contours and pulmonary vascularity. BILATERAL lower lobe consolidation RIGHT greater than LEFT compatible with pneumonia. Upper lungs clear. No pleural effusion or pneumothorax. Bones unremarkable.  IMPRESSION: BILATERAL lower lobe consolidation RIGHT  greater than LEFT consistent with pneumonia. Followup PA and lateral chest X-ray is recommended in 3-4 weeks following trial of antibiotic therapy to ensure resolution and exclude underlying abnormalities including malignancy. Electronically Signed   By: Ulyses Southward M.D.   On: 11/07/2016 15:19        Scheduled Meds: . ALPRAZolam  1 mg Oral TID  . enoxaparin (LOVENOX) injection  40 mg Subcutaneous Q24H  . nicotine  14 mg Transdermal Daily  . sodium chloride flush  3 mL Intravenous Q12H   Continuous Infusions: . azithromycin Stopped (11/08/16 1753)  . cefTRIAXone (ROCEPHIN)  IV    . cefTRIAXone (ROCEPHIN)  IV Stopped (11/08/16 1033)     LOS: 2 days    Time spent: 30 minutes    Katrinka Blazing, MD [facility] Pager [phone]  If 7PM-7AM, please contact night-coverage [email or web address] Password [email or web address] 11/09/2016, 8:35 AM

## 2016-11-09 NOTE — Progress Notes (Signed)
Patient called me in his room to remove his IV. I explained to him that it could not be removed until patient was discharged by his doctor. I left the room and then he came out and told me that he wanted to leave AMA. I paged Dr. Rinaldo RatelKadolph to inform her of patient wanting to leave. I talked to patient about the risks of leaving with the condition he came in here with and explained about his infection and sepsis. He verbalized understanding about all of this and then stated that he would follow up with his doctor after he got all of his stuff straight at home, he then told me about his uncle passing away and his mom being diagnosed with cancer. After this he signed his AMA paper and left.

## 2016-11-10 LAB — CULTURE, RESPIRATORY W GRAM STAIN

## 2016-11-10 LAB — CULTURE, RESPIRATORY: CULTURE: NORMAL

## 2016-11-10 NOTE — Discharge Summary (Signed)
Patient left AMA.  Nursing staff explained risks of leaving against medical advice but patient stated he needed to leave because his uncle had passed away.  Nursing staff gave patient AMA paperwork to sign.  He was encouraged to return to the hospital as needed.

## 2016-11-12 LAB — CULTURE, BLOOD (ROUTINE X 2)
CULTURE: NO GROWTH
CULTURE: NO GROWTH

## 2023-11-07 ENCOUNTER — Ambulatory Visit: Payer: Self-pay | Admitting: Student in an Organized Health Care Education/Training Program

## 2024-03-14 ENCOUNTER — Encounter (HOSPITAL_COMMUNITY): Payer: Self-pay | Admitting: Emergency Medicine

## 2024-03-14 ENCOUNTER — Emergency Department (HOSPITAL_COMMUNITY): Payer: Self-pay

## 2024-03-14 ENCOUNTER — Inpatient Hospital Stay (HOSPITAL_COMMUNITY)
Admission: EM | Admit: 2024-03-14 | Discharge: 2024-03-25 | DRG: 871 | Discharge status: Against Medical Advice | Payer: Self-pay | Attending: Internal Medicine | Admitting: Internal Medicine

## 2024-03-14 ENCOUNTER — Other Ambulatory Visit: Payer: Self-pay

## 2024-03-14 DIAGNOSIS — K6812 Psoas muscle abscess: Secondary | ICD-10-CM | POA: Diagnosis present

## 2024-03-14 DIAGNOSIS — M4620 Osteomyelitis of vertebra, site unspecified: Secondary | ICD-10-CM | POA: Insufficient documentation

## 2024-03-14 DIAGNOSIS — L89152 Pressure ulcer of sacral region, stage 2: Secondary | ICD-10-CM | POA: Diagnosis present

## 2024-03-14 DIAGNOSIS — M4646 Discitis, unspecified, lumbar region: Secondary | ICD-10-CM | POA: Diagnosis present

## 2024-03-14 DIAGNOSIS — Z681 Body mass index (BMI) 19 or less, adult: Secondary | ICD-10-CM

## 2024-03-14 DIAGNOSIS — F1990 Other psychoactive substance use, unspecified, uncomplicated: Secondary | ICD-10-CM

## 2024-03-14 DIAGNOSIS — N179 Acute kidney failure, unspecified: Secondary | ICD-10-CM | POA: Diagnosis present

## 2024-03-14 DIAGNOSIS — I76 Septic arterial embolism: Secondary | ICD-10-CM | POA: Diagnosis present

## 2024-03-14 DIAGNOSIS — A419 Sepsis, unspecified organism: Secondary | ICD-10-CM | POA: Diagnosis present

## 2024-03-14 DIAGNOSIS — L8990 Pressure ulcer of unspecified site, unspecified stage: Secondary | ICD-10-CM | POA: Insufficient documentation

## 2024-03-14 DIAGNOSIS — A4101 Sepsis due to Methicillin susceptible Staphylococcus aureus: Principal | ICD-10-CM | POA: Diagnosis present

## 2024-03-14 DIAGNOSIS — D649 Anemia, unspecified: Secondary | ICD-10-CM | POA: Diagnosis present

## 2024-03-14 DIAGNOSIS — E43 Unspecified severe protein-calorie malnutrition: Secondary | ICD-10-CM | POA: Diagnosis present

## 2024-03-14 DIAGNOSIS — E872 Acidosis, unspecified: Secondary | ICD-10-CM | POA: Diagnosis present

## 2024-03-14 DIAGNOSIS — M4647 Discitis, unspecified, lumbosacral region: Secondary | ICD-10-CM | POA: Diagnosis present

## 2024-03-14 DIAGNOSIS — Z86711 Personal history of pulmonary embolism: Secondary | ICD-10-CM

## 2024-03-14 DIAGNOSIS — E8809 Other disorders of plasma-protein metabolism, not elsewhere classified: Secondary | ICD-10-CM | POA: Diagnosis present

## 2024-03-14 DIAGNOSIS — R7401 Elevation of levels of liver transaminase levels: Secondary | ICD-10-CM | POA: Insufficient documentation

## 2024-03-14 DIAGNOSIS — I10 Essential (primary) hypertension: Secondary | ICD-10-CM | POA: Diagnosis present

## 2024-03-14 DIAGNOSIS — F17210 Nicotine dependence, cigarettes, uncomplicated: Secondary | ICD-10-CM | POA: Diagnosis present

## 2024-03-14 DIAGNOSIS — D696 Thrombocytopenia, unspecified: Secondary | ICD-10-CM | POA: Insufficient documentation

## 2024-03-14 DIAGNOSIS — J189 Pneumonia, unspecified organism: Principal | ICD-10-CM | POA: Diagnosis present

## 2024-03-14 DIAGNOSIS — I3139 Other pericardial effusion (noninflammatory): Secondary | ICD-10-CM | POA: Diagnosis present

## 2024-03-14 DIAGNOSIS — Z792 Long term (current) use of antibiotics: Secondary | ICD-10-CM

## 2024-03-14 DIAGNOSIS — K760 Fatty (change of) liver, not elsewhere classified: Secondary | ICD-10-CM | POA: Diagnosis present

## 2024-03-14 DIAGNOSIS — Z5329 Procedure and treatment not carried out because of patient's decision for other reasons: Secondary | ICD-10-CM | POA: Diagnosis present

## 2024-03-14 DIAGNOSIS — D6959 Other secondary thrombocytopenia: Secondary | ICD-10-CM | POA: Diagnosis present

## 2024-03-14 DIAGNOSIS — R627 Adult failure to thrive: Secondary | ICD-10-CM | POA: Diagnosis present

## 2024-03-14 DIAGNOSIS — M4626 Osteomyelitis of vertebra, lumbar region: Secondary | ICD-10-CM | POA: Diagnosis present

## 2024-03-14 DIAGNOSIS — K5900 Constipation, unspecified: Secondary | ICD-10-CM | POA: Diagnosis not present

## 2024-03-14 LAB — CBC WITH DIFFERENTIAL/PLATELET
Band Neutrophils: 1 %
Basophils Absolute: 0 K/uL (ref 0.0–0.1)
Basophils Relative: 0 %
Blasts: 2 %
Eosinophils Absolute: 0 K/uL (ref 0.0–0.5)
Eosinophils Relative: 0 %
HCT: 32.4 % — ABNORMAL LOW (ref 39.0–52.0)
Hemoglobin: 10.5 g/dL — ABNORMAL LOW (ref 13.0–17.0)
Lymphocytes Relative: 4 %
Lymphs Abs: 0.4 K/uL — ABNORMAL LOW (ref 0.7–4.0)
MCH: 29.7 pg (ref 26.0–34.0)
MCHC: 32.4 g/dL (ref 30.0–36.0)
MCV: 91.8 fL (ref 80.0–100.0)
Monocytes Absolute: 0.3 K/uL (ref 0.1–1.0)
Monocytes Relative: 3 %
Neutro Abs: 8.7 K/uL — ABNORMAL HIGH (ref 1.7–7.7)
Neutrophils Relative %: 90 %
Platelets: 104 K/uL — ABNORMAL LOW (ref 150–400)
RBC: 3.53 MIL/uL — ABNORMAL LOW (ref 4.22–5.81)
RDW: 14 % (ref 11.5–15.5)
Smear Review: NORMAL
WBC: 9.6 K/uL (ref 4.0–10.5)
nRBC: 0 % (ref 0.0–0.2)

## 2024-03-14 LAB — COMPREHENSIVE METABOLIC PANEL WITH GFR
ALT: 23 U/L (ref 0–44)
AST: 48 U/L — ABNORMAL HIGH (ref 15–41)
Albumin: 2.7 g/dL — ABNORMAL LOW (ref 3.5–5.0)
Alkaline Phosphatase: 362 U/L — ABNORMAL HIGH (ref 38–126)
Anion gap: 14 (ref 5–15)
BUN: 46 mg/dL — ABNORMAL HIGH (ref 6–20)
CO2: 21 mmol/L — ABNORMAL LOW (ref 22–32)
Calcium: 8.6 mg/dL — ABNORMAL LOW (ref 8.9–10.3)
Chloride: 98 mmol/L (ref 98–111)
Creatinine, Ser: 1.45 mg/dL — ABNORMAL HIGH (ref 0.61–1.24)
GFR, Estimated: >60 mL/min (ref >60)
Glucose, Bld: 129 mg/dL — ABNORMAL HIGH (ref 70–99)
Potassium: 4.8 mmol/L (ref 3.5–5.1)
Sodium: 133 mmol/L — ABNORMAL LOW (ref 135–145)
Total Bilirubin: 1.6 mg/dL — ABNORMAL HIGH (ref 0.0–1.2)
Total Protein: 7.7 g/dL (ref 6.5–8.1)

## 2024-03-14 LAB — PROTIME-INR
INR: 1 (ref 0.8–1.2)
Prothrombin Time: 14.2 s (ref 11.4–15.2)

## 2024-03-14 LAB — LACTIC ACID, PLASMA: Lactic Acid, Venous: 2.5 mmol/L (ref 0.5–1.9)

## 2024-03-14 LAB — AMMONIA: Ammonia: 28 umol/L (ref 9–35)

## 2024-03-14 LAB — TSH: TSH: 1.73 u[IU]/mL (ref 0.350–4.500)

## 2024-03-14 LAB — CBG MONITORING, ED: Glucose-Capillary: 113 mg/dL — ABNORMAL HIGH (ref 70–99)

## 2024-03-14 MED ORDER — SODIUM CHLORIDE 0.9 % IV BOLUS
1000.0000 mL | Freq: Once | INTRAVENOUS | Status: AC
  Administered 2024-03-14: 1000 mL via INTRAVENOUS

## 2024-03-14 MED ORDER — IOHEXOL 300 MG/ML  SOLN
100.0000 mL | Freq: Once | INTRAMUSCULAR | Status: AC | PRN
  Administered 2024-03-14: 100 mL via INTRAVENOUS

## 2024-03-14 MED ORDER — SODIUM CHLORIDE 0.9 % IV SOLN
500.0000 mg | Freq: Once | INTRAVENOUS | Status: AC
  Administered 2024-03-14: 500 mg via INTRAVENOUS
  Filled 2024-03-14: qty 5

## 2024-03-14 MED ORDER — CEFTRIAXONE SODIUM 2 G IJ SOLR
2.0000 g | Freq: Once | INTRAMUSCULAR | Status: AC
  Administered 2024-03-14: 2 g via INTRAVENOUS
  Filled 2024-03-14: qty 20

## 2024-03-14 MED ORDER — LACTATED RINGERS IV SOLN
INTRAVENOUS | Status: AC
Start: 2024-03-14 — End: 2024-03-15

## 2024-03-14 MED ORDER — SODIUM CHLORIDE 0.9 % IV SOLN
2.0000 g | Freq: Once | INTRAVENOUS | Status: AC
  Administered 2024-03-15: 2 g via INTRAVENOUS
  Filled 2024-03-14: qty 12.5

## 2024-03-14 MED ORDER — VANCOMYCIN HCL 1500 MG/300ML IV SOLN
1500.0000 mg | Freq: Once | INTRAVENOUS | Status: AC
  Administered 2024-03-15: 1500 mg via INTRAVENOUS
  Filled 2024-03-14: qty 300

## 2024-03-14 MED ORDER — ONDANSETRON HCL 4 MG/2ML IJ SOLN
4.0000 mg | Freq: Once | INTRAMUSCULAR | Status: AC
  Administered 2024-03-15: 4 mg via INTRAVENOUS
  Filled 2024-03-14: qty 2

## 2024-03-14 MED ORDER — MORPHINE SULFATE (PF) 4 MG/ML IV SOLN
4.0000 mg | Freq: Once | INTRAVENOUS | Status: AC
  Administered 2024-03-15: 4 mg via INTRAVENOUS
  Filled 2024-03-14: qty 1

## 2024-03-14 NOTE — ED Notes (Signed)
 MD made aware of inability to gain IV access

## 2024-03-14 NOTE — ED Notes (Signed)
 Wife came out of rooms stating that the pt is in a lot of pain and would like pain medicine. EDP made aware

## 2024-03-14 NOTE — Sepsis Progress Note (Addendum)
 Elink monitoring for the code sepsis protocol.    23:30 Notified bedside nurse of need to draw repeat lactic acid.

## 2024-03-14 NOTE — ED Triage Notes (Signed)
 Pt c/o being sick for a while. Wife states re injured his herniated disk so he started just lying around for about 5 weeks but worse the last week and not eating. Losing week. Pt appears slightly jaundiced. Smells of urine. A/o but slow to respond. coughing

## 2024-03-14 NOTE — ED Notes (Signed)
 EDP made aware about heart rate

## 2024-03-14 NOTE — ED Notes (Signed)
 Antibiotics delayed due to CT and lab

## 2024-03-14 NOTE — Consult Note (Signed)
 CODE SEPSIS - PHARMACY COMMUNICATION  **Broad Spectrum Antibiotics should be administered within 1 hour of Sepsis diagnosis**  Time Code Sepsis Called/Page Received: 2108  Antibiotics Ordered: ceftriaxone , vancomycin , and azithromycin   Time of 1st antibiotic administration: 2149  Additional action taken by pharmacy: N/A  Kayla JULIANNA Blew ,PharmD Clinical Pharmacist  03/14/2024  9:09 PM

## 2024-03-14 NOTE — ED Provider Notes (Signed)
 [facility] AT [facility] Provider Note  CSN: [id number] Arrival date & time: 03/14/24 1839  Chief Complaint(s) No chief complaint on file.  HPI [name] [name] is a 48 y.o. male with past medical history as below, significant for prior alcohol abuse, hypertension, anxiety disorder, sinus tachycardia, tobacco use, chronic back pain who presents to the ED with complaint of generalized weakness, poor p.o. intake  Patient's been feeling unwell over the past few weeks, reports that he hurt his back and has been laying in the bed over the past month or so.  Reports very poor p.o. intake, minimal appetite, he has no dysphagia.  He is tolerant secretions and liquids without difficulty.  Patient also having cough over the past few days, dark-colored sputum.  Subjective fever over the past few days.  Chills intermittently.  Exertional dyspnea reported.  No numbness or weakness to his extremities, he has lumbar back pain ongoing over the past 5 weeks, feels he aggravated a chronic injury.  No fall.  Reports that he slept funny.  No alcohol in the past couple months, no cigarettes in the past few days.  Of note patient does report IV drug use, has not used injectable drugs in over 6 months   Past Medical History Past Medical History:  Diagnosis Date   Anxiety    Back pain    Fracture of rib of right side 01/26/2015   Hypertension    Patient Active Problem List   Diagnosis Date Noted   Sepsis due to pneumonia (HCC) 11/07/2016   Leukopenia 11/07/2016   Acute respiratory failure with hypoxia (HCC) 11/07/2016   Anxiety disorder 11/07/2016   Hyperbilirubinemia 11/07/2016   Fever 01/18/2014   CAP (community acquired pneumonia) 05/19/2013   Sinus tachycardia 05/19/2013   Tobacco abuse 05/19/2013   Home Medication(s) Prior to Admission medications   Medication Sig Start Date End Date Taking? Authorizing Provider  ALPRAZolam  (XANAX ) 1 MG tablet Take 1 mg by mouth  in the morning, at noon, in the evening, and at bedtime.   Yes [provider]  Oxycodone  HCl 10 MG TABS Take 1 tablet by mouth 3 (three) times daily.   Yes [provider]                                                                                                                                    Past Surgical History Past Surgical History:  Procedure Laterality Date   TEE WITHOUT CARDIOVERSION N/A 01/21/2014   Procedure: TRANSESOPHAGEAL ECHOCARDIOGRAM (TEE) with propofol ;  Surgeon: Dorn JULIANNA Ross, MD;  Location: [facility];  Service: Endoscopy;  Laterality: N/A;   Family History History reviewed. No pertinent family history.  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   Physical Exam Vital Signs  I have reviewed the triage vital signs BP (!) 122/92   Pulse (!) 113   Temp 98.9 F (37.2 C) (Oral)   Resp (!) 23   Ht 6' (1.829 m)   SpO2 96%   BMI 22.28 kg/m  Physical Exam Vitals and nursing note reviewed.  Constitutional:      General: He is not in acute distress.    Appearance: He is well-developed. He is ill-appearing.  HENT:     Head: Normocephalic and atraumatic.     Right Ear: External ear normal.     Left Ear: External ear normal.     Mouth/Throat:     Mouth: Mucous membranes are dry.  Eyes:     General: No scleral icterus. Cardiovascular:     Rate and Rhythm: Regular rhythm. Tachycardia present.     Pulses: Normal pulses.     Heart sounds: Normal heart sounds.  Pulmonary:     Effort: Pulmonary effort is normal. No tachypnea or  respiratory distress.     Breath sounds: Normal breath sounds.     Comments: Coarse R > L Abdominal:     General: Abdomen is flat.     Palpations: Abdomen is soft.     Tenderness: There is abdominal tenderness.     Comments: Scaphoid  Musculoskeletal:     Cervical back: No rigidity.     Right lower leg: No edema.     Left lower leg: No edema.  Skin:    General: Skin is warm and dry.     Capillary Refill: Capillary refill takes less than 2 seconds.  Neurological:     Mental Status: He is alert.  Psychiatric:        Mood and Affect: Mood normal.        Behavior: Behavior normal.     ED Results and Treatments Labs (all labs ordered are listed, but only abnormal results are displayed) Labs Reviewed  COMPREHENSIVE METABOLIC PANEL WITH GFR - Abnormal; Notable for the following components:      Result Value   Sodium 133 (*)    CO2 21 (*)    Glucose, Bld 129 (*)    BUN 46 (*)    Creatinine, Ser 1.45 (*)    Calcium 8.6 (*)    Albumin 2.7 (*)    AST 48 (*)    Alkaline Phosphatase 362 (*)    Total Bilirubin 1.6 (*)    All other components within normal limits  LACTIC ACID, PLASMA - Abnormal; Notable for the following components:   Lactic Acid, Venous 2.5 (*)    All other components within normal limits  CBC WITH DIFFERENTIAL/PLATELET - Abnormal; Notable for the following components:   RBC 3.53 (*)    Hemoglobin 10.5 (*)    HCT 32.4 (*)    Platelets 104 (*)    Neutro Abs 8.7 (*)    Lymphs Abs 0.4 (*)    All other components within normal limits  CBG MONITORING, ED - Abnormal; Notable for the following components:   Glucose-Capillary 113 (*)    All other components within normal limits  CULTURE, BLOOD (ROUTINE X  2)  CULTURE, BLOOD (ROUTINE X 2)  PROTIME-INR  AMMONIA  TSH  LACTIC ACID, PLASMA  URINALYSIS, W/ REFLEX TO CULTURE (INFECTION SUSPECTED)  PATHOLOGIST SMEAR REVIEW  T4, FREE                                                                                                                           Radiology CT ABDOMEN PELVIS W CONTRAST Result Date: 03/14/2024 CLINICAL DATA:  Suspected sepsis. Low energy x5 weeks worsening in the last week. Infiltrates on chest x-ray. Abdominal pain, acute, nonlocalized with known herniated lumbar disc with recent re-injury per family. EXAM: CT ABDOMEN AND PELVIS WITH CONTRAST CT LUMBAR SPINE WITHOUT CONTRAST TECHNIQUE: Multidetector CT imaging of the abdomen and pelvis was performed using the standard protocol following bolus administration of intravenous contrast. Multidetector CT imaging of the lumbar spine was performed without the use of contrast. Multiplanar CT image reconstructions were also generated and reviewed. RADIATION DOSE REDUCTION: This exam was performed according to the departmental dose-optimization program which includes automated exposure control, adjustment of the mA and/or kV according to patient size and/or use of iterative reconstruction technique. CONTRAST:  OMNIPAQUE  IOHEXOL  300 MG/ML  SOLN COMPARISON:  CT abdomen and pelvis with contrast 02/02/2015. FINDINGS: CT ABDOMEN AND PELVIS WITH CONTRAST FINDINGS Lower chest: There are numerous diffusely scattered small cavitary lesions both lung bases, seen as nodular infiltrates on chest x-ray. Findings are most likely due to multifocal septic lung emboli, and most of them contain fluid compatible with these being abscesses. The largest of these is in the anteromedial right middle lobe measuring 3.2 cm on the first image. The largest in the right lower lobe is in the anterior basal segment measuring 2.7 cm on 6:8. These are less numerous in the left lung base. The largest left lung base cavity is in the lingular base measuring 2.7 cm on 6:8 and there is patchy airspace disease associated with smaller cavitary foci in the posterior basal left lower lobe. There is a small pericardial effusion. The pericardial fluid is low in density. The cardiac size is normal.  Hepatobiliary: The liver is mildly steatotic measuring 21 cm length. There is no mass enhancement or abscess. Gallbladder and bile ducts are unremarkable. Pancreas: No abnormality Spleen: No mass. Splenomegaly. The spleen measures 12.3 x 4.1 x 16 cm. Adrenals/Urinary Tract: there is no adrenal or renal mass enhancement, no urinary stone or obstruction. There is a Bosniak 1 cyst in the superior pole of the right kidney measuring 1.2 cm, 13 Hounsfield units. Smaller Bosniak 2 cortical cyst in the mid pole of this kidney is too small characterize. No follow-up imaging is recommended. Both kidneys are otherwise unremarkable and secrete symmetrically on the delayed images. The bladder is mildly thickened which could be due to underdistention, hypertrophy or cystitis. Stomach/Bowel: No dilatation. No overt wall thickening. Limited assessment without enteric contrast. Normal caliber appendix. Scattered sigmoid diverticulosis. No evidence of acute diverticulitis. Vascular/Lymphatic: Patchy aortoiliac calcific plaque, age  advanced, increased from 9 years ago. Clinical correlation advised. There is a prominent hepatic portal vein measuring 16 mm and increasingly engorged splenic vein. The SMV and tributaries are normal caliber. No adenopathy is seen. Reproductive: No prostatomegaly.  Pelvic phleboliths. Other: Generalized mesenteric congestive changes. No focal inflammatory process is seen through the generalized edema. There is a small volume of pelvic ascites. There is no free hemorrhage, free air or incarcerated hernia. There is mild body wall anasarca. Musculoskeletal: Mild dextroscoliosis, degenerative changes of the lumbar spine, with additional lumbar findings which will be described below. CT LUMBAR SPINE WITHOUT CONTRAST FINDINGS Segmentation: There are 5 lumbar type segments, but L5 is transitional demonstrating right hemisacralization. Alignment: There is mild lumbar dextroscoliosis apex at L1-2. There is a trace,  likely discogenic retrolisthesis at L2-3 where there is disc space loss. No other listhesis or further malalignment is seen. Vertebrae: At L2-3 there are small anterior and posterior peridiscal endplate lucencies with slight fragmentation. At L4-5, there is peridiscal endplate fragmentation and subcortical lucency to the right. Both findings could have an infectious or posttraumatic basis. The patient does relate a history of recent back injury. The findings at L2-3 are less clearly posttraumatic given the location anteriorly and posteriorly. At L1, there is nondisplaced bony fragmentation of the anterior superior vertebral body without height loss. All vertebrae are grossly normal in heights. This latter finding is almost certainly due to trauma. Other vertebrae are grossly intact. There is mild-to-moderate thoracolumbar spondylosis. Paraspinal soft tissues: There is paraspinal soft tissue thickening at L2-3. This could be due to paraspinal phlegmon or posttraumatic reactive soft tissue change. At L4-5 there is similar paraspinal soft tissue thickening on the right likely on the same etiology. There is no psoas abscess or localizing paraspinal fluid collection. Disc levels: T11-12 through L1-2: Inferior osteophytes. Normal disc heights. No herniations or stenoses. L2-3: Moderate disc space loss greater on the left. Left paracentral to left far lateral disc bulge extends into the left foramen with moderate to severe left foraminal stenosis. There is mild right foraminal stenosis. There is moderate effacement of the left subarticular zone but no significant spinal stenosis, no herniation is seen. L3-4: Normal disc height. Mild diffuse annular bulge. No herniation, canal zone or foraminal zone stenosis. L4-5: Mild disc space loss. Mild diffuse disc bulge without herniation or stenosis except for mild subarticular zone encroachment. The foramina appear clear. L5-S1: No abnormality. Other: Both SI joints are patent with  slight spurring. No SI joint erosions. IMPRESSION: 1. Numerous small cavitary lung base nodules, most likely due to multifocal septic lung emboli, most of them containing fluid compatible with these being abscesses. 2. Small pericardial effusion. 3. Generalized mesenteric congestive changes, small volume of pelvic ascites, and mild body wall anasarca. 4. Mild hepatic steatosis with hepatosplenomegaly and increased prominence of the hepatic portal vein and splenic vein. 5. Cystitis versus bladder hypertrophy or underdistention. 6. Age advanced aortoiliac atherosclerosis. 7. L2-3 disc space loss with small anterior and posterior peridiscal endplate lucencies with slight fragmentation. There is paraspinal soft tissue thickening at this level which could be due to paraspinal phlegmon or posttraumatic reactive soft tissue change. 8. L4-5 peridiscal endplate fragmentation and subcortical lucency to the right, with paraspinal soft tissue thickening on the right. Findings could be due to discitis/osteomyelitis or posttraumatic change. 9. Nondisplaced bony fragmentation of the anterior superior L1 vertebral body without height loss. This is almost certainly due to trauma. 10. L2-3 left paracentral to left far lateral disc bulge with  moderate to severe left foraminal stenosis and moderate effacement of the left subarticular zone. 11. L4-5 mild subarticular zone encroachment. 12. Lumbar MRI without and with contrast may be helpful to further characterize these findings. Aortic Atherosclerosis (ICD10-I70.0). Electronically Signed   By: Francis Quam M.D.   On: 03/14/2024 22:24   CT L-SPINE NO CHARGE Result Date: 03/14/2024 CLINICAL DATA:  Suspected sepsis. Low energy x5 weeks worsening in the last week. Infiltrates on chest x-ray. Abdominal pain, acute, nonlocalized with known herniated lumbar disc with recent re-injury per family. EXAM: CT ABDOMEN AND PELVIS WITH CONTRAST CT LUMBAR SPINE WITHOUT CONTRAST TECHNIQUE:  Multidetector CT imaging of the abdomen and pelvis was performed using the standard protocol following bolus administration of intravenous contrast. Multidetector CT imaging of the lumbar spine was performed without the use of contrast. Multiplanar CT image reconstructions were also generated and reviewed. RADIATION DOSE REDUCTION: This exam was performed according to the departmental dose-optimization program which includes automated exposure control, adjustment of the mA and/or kV according to patient size and/or use of iterative reconstruction technique. CONTRAST:  OMNIPAQUE  IOHEXOL  300 MG/ML  SOLN COMPARISON:  CT abdomen and pelvis with contrast 02/02/2015. FINDINGS: CT ABDOMEN AND PELVIS WITH CONTRAST FINDINGS Lower chest: There are numerous diffusely scattered small cavitary lesions both lung bases, seen as nodular infiltrates on chest x-ray. Findings are most likely due to multifocal septic lung emboli, and most of them contain fluid compatible with these being abscesses. The largest of these is in the anteromedial right middle lobe measuring 3.2 cm on the first image. The largest in the right lower lobe is in the anterior basal segment measuring 2.7 cm on 6:8. These are less numerous in the left lung base. The largest left lung base cavity is in the lingular base measuring 2.7 cm on 6:8 and there is patchy airspace disease associated with smaller cavitary foci in the posterior basal left lower lobe. There is a small pericardial effusion. The pericardial fluid is low in density. The cardiac size is normal. Hepatobiliary: The liver is mildly steatotic measuring 21 cm length. There is no mass enhancement or abscess. Gallbladder and bile ducts are unremarkable. Pancreas: No abnormality Spleen: No mass. Splenomegaly. The spleen measures 12.3 x 4.1 x 16 cm. Adrenals/Urinary Tract: there is no adrenal or renal mass enhancement, no urinary stone or obstruction. There is a Bosniak 1 cyst in the superior pole of  the right kidney measuring 1.2 cm, 13 Hounsfield units. Smaller Bosniak 2 cortical cyst in the mid pole of this kidney is too small characterize. No follow-up imaging is recommended. Both kidneys are otherwise unremarkable and secrete symmetrically on the delayed images. The bladder is mildly thickened which could be due to underdistention, hypertrophy or cystitis. Stomach/Bowel: No dilatation. No overt wall thickening. Limited assessment without enteric contrast. Normal caliber appendix. Scattered sigmoid diverticulosis. No evidence of acute diverticulitis. Vascular/Lymphatic: Patchy aortoiliac calcific plaque, age advanced, increased from 9 years ago. Clinical correlation advised. There is a prominent hepatic portal vein measuring 16 mm and increasingly engorged splenic vein. The SMV and tributaries are normal caliber. No adenopathy is seen. Reproductive: No prostatomegaly.  Pelvic phleboliths. Other: Generalized mesenteric congestive changes. No focal inflammatory process is seen through the generalized edema. There is a small volume of pelvic ascites. There is no free hemorrhage, free air or incarcerated hernia. There is mild body wall anasarca. Musculoskeletal: Mild dextroscoliosis, degenerative changes of the lumbar spine, with additional lumbar findings which will be described below. CT LUMBAR SPINE  WITHOUT CONTRAST FINDINGS Segmentation: There are 5 lumbar type segments, but L5 is transitional demonstrating right hemisacralization. Alignment: There is mild lumbar dextroscoliosis apex at L1-2. There is a trace, likely discogenic retrolisthesis at L2-3 where there is disc space loss. No other listhesis or further malalignment is seen. Vertebrae: At L2-3 there are small anterior and posterior peridiscal endplate lucencies with slight fragmentation. At L4-5, there is peridiscal endplate fragmentation and subcortical lucency to the right. Both findings could have an infectious or posttraumatic basis. The patient  does relate a history of recent back injury. The findings at L2-3 are less clearly posttraumatic given the location anteriorly and posteriorly. At L1, there is nondisplaced bony fragmentation of the anterior superior vertebral body without height loss. All vertebrae are grossly normal in heights. This latter finding is almost certainly due to trauma. Other vertebrae are grossly intact. There is mild-to-moderate thoracolumbar spondylosis. Paraspinal soft tissues: There is paraspinal soft tissue thickening at L2-3. This could be due to paraspinal phlegmon or posttraumatic reactive soft tissue change. At L4-5 there is similar paraspinal soft tissue thickening on the right likely on the same etiology. There is no psoas abscess or localizing paraspinal fluid collection. Disc levels: T11-12 through L1-2: Inferior osteophytes. Normal disc heights. No herniations or stenoses. L2-3: Moderate disc space loss greater on the left. Left paracentral to left far lateral disc bulge extends into the left foramen with moderate to severe left foraminal stenosis. There is mild right foraminal stenosis. There is moderate effacement of the left subarticular zone but no significant spinal stenosis, no herniation is seen. L3-4: Normal disc height. Mild diffuse annular bulge. No herniation, canal zone or foraminal zone stenosis. L4-5: Mild disc space loss. Mild diffuse disc bulge without herniation or stenosis except for mild subarticular zone encroachment. The foramina appear clear. L5-S1: No abnormality. Other: Both SI joints are patent with slight spurring. No SI joint erosions. IMPRESSION: 1. Numerous small cavitary lung base nodules, most likely due to multifocal septic lung emboli, most of them containing fluid compatible with these being abscesses. 2. Small pericardial effusion. 3. Generalized mesenteric congestive changes, small volume of pelvic ascites, and mild body wall anasarca. 4. Mild hepatic steatosis with hepatosplenomegaly  and increased prominence of the hepatic portal vein and splenic vein. 5. Cystitis versus bladder hypertrophy or underdistention. 6. Age advanced aortoiliac atherosclerosis. 7. L2-3 disc space loss with small anterior and posterior peridiscal endplate lucencies with slight fragmentation. There is paraspinal soft tissue thickening at this level which could be due to paraspinal phlegmon or posttraumatic reactive soft tissue change. 8. L4-5 peridiscal endplate fragmentation and subcortical lucency to the right, with paraspinal soft tissue thickening on the right. Findings could be due to discitis/osteomyelitis or posttraumatic change. 9. Nondisplaced bony fragmentation of the anterior superior L1 vertebral body without height loss. This is almost certainly due to trauma. 10. L2-3 left paracentral to left far lateral disc bulge with moderate to severe left foraminal stenosis and moderate effacement of the left subarticular zone. 11. L4-5 mild subarticular zone encroachment. 12. Lumbar MRI without and with contrast may be helpful to further characterize these findings. Aortic Atherosclerosis (ICD10-I70.0). Electronically Signed   By: Francis Quam M.D.   On: 03/14/2024 22:24   DG Chest Port 1 View if patient is in a treatment room. Result Date: 03/14/2024 EXAM: 1 VIEW(S) XRAY OF THE CHEST 03/14/2024 08:26:20 PM COMPARISON: None available. CLINICAL HISTORY: Suspected Sepsis. Table formatting from the original note was not included.; Per triage: ; Pt c/o being  sick for a while. Wife states re injured his herniated disk so he started just lying around for about 5 weeks but worse the last week and not eating. Losing week. Pt appears slightly jaundiced. Smells of urine. A/o but slow to respond. ; coughing FINDINGS: LUNGS AND PLEURA: Patchy nodular airspace opacities in both lungs. No pulmonary edema. No pleural effusion. No pneumothorax. HEART AND MEDIASTINUM: No acute abnormality of the cardiac and mediastinal  silhouettes. BONES AND SOFT TISSUES: No acute osseous abnormality. IMPRESSION: 1. Patchy nodular airspace opacities in both lungs, concerning for infection. Electronically signed by: Ozell Daring MD 03/14/2024 08:32 PM EDT RP Workstation: [id number]    Pertinent labs & imaging results that were available during my care of the patient were reviewed by me and considered in my medical decision making (see MDM for details).  Medications Ordered in ED Medications  lactated ringers  infusion ( Intravenous New Bag/Given 03/14/24 2253)  morphine  (PF) 4 MG/ML injection 4 mg (has no administration in time range)  ondansetron  (ZOFRAN ) injection 4 mg (has no administration in time range)  vancomycin  (VANCOREADY) IVPB 1500 mg/300 mL (has no administration in time range)  ceFEPIme (MAXIPIME) 2 g in sodium chloride  0.9 % 100 mL IVPB (has no administration in time range)  sodium chloride  0.9 % bolus 1,000 mL (0 mLs Intravenous Stopped 03/14/24 2131)  cefTRIAXone  (ROCEPHIN ) 2 g in sodium chloride  0.9 % 100 mL IVPB (0 g Intravenous Stopped 03/14/24 2221)  azithromycin  (ZITHROMAX ) 500 mg in sodium chloride  0.9 % 250 mL IVPB (0 mg Intravenous Stopped 03/14/24 2249)  sodium chloride  0.9 % bolus 1,000 mL (0 mLs Intravenous Stopped 03/14/24 2249)  iohexol  (OMNIPAQUE ) 300 MG/ML solution 100 mL (100 mLs Intravenous Contrast Given 03/14/24 2111)                                                                                                                                     Procedures .Critical Care  Performed by: Elnor Jayson LABOR, DO Authorized by: Elnor Jayson LABOR, DO   Critical care provider statement:    Critical care time (minutes):  30   Critical care time was exclusive of:  Separately billable procedures and treating other patients   Critical care was necessary to treat or prevent imminent or life-threatening deterioration of the following conditions:  Sepsis   Critical care was time spent personally by me on the  following activities:  Development of treatment plan with patient or surrogate, discussions with consultants, evaluation of patient's response to treatment, examination of patient, ordering and review of laboratory studies, ordering and review of radiographic studies, ordering and performing treatments and interventions, pulse oximetry, re-evaluation of patient's condition, review of old charts and obtaining history from patient or surrogate   Care discussed with: admitting provider     (including critical care time)  Medical Decision Making / ED Course    Medical Decision Making:    [name] Saintjean  [name] is a 48 y.o. male with past medical history as below, significant for prior alcohol abuse, hypertension, anxiety disorder, sinus tachycardia, tobacco use, chronic back pain who presents to the ED with complaint of generalized weakness, poor p.o. intake . The complaint involves an extensive differential diagnosis and also carries with it a high risk of complications and morbidity.  Serious etiology was considered. Ddx includes but is not limited to: Differential diagnosis includes but is not exclusive to acute cholecystitis, intrathoracic causes for epigastric abdominal pain, gastritis, duodenitis, pancreatitis, small bowel or large bowel obstruction, abdominal aortic aneurysm, hernia, gastritis, pleural effusion, pneumonia, CHF, viral infection, neoplasm, renal dysfunction, liver dysfunction etc.   Complete initial physical exam performed, notably the patient was in no acute stress, he is tachycardic.    Reviewed and confirmed nursing documentation for past medical history, family history, social history.  Vital signs reviewed.    Sepsis> - He has 2 SIRS, lactic acidosis, fever pta per pt/family; concern for pulm infection initially - CT concerning for multifocal septic lung emboli, possible abscess but also concern for possible osteomyelitis versus discitis.  Also has pericardial effusion - Given  report IV drug use concern that this is the likely etiology.  Blood cultures were sent.  Start broad-spectrum antibiotics.  Will plan to admit.  - Possible osteomyelitis on imaging (CTL), would recommend MRI in the AM. He has no focal neuro deficits.    Sinus tachycardia> - Patient with known history of sinus tachycardia, prior EKG 130's, he has no CP or palpitations. - give IVF, improving     Clinical Course as of 03/14/24 2349  Thu Mar 14, 2024  2034 Creatinine(!): 1.45 aki [SG]  2108 Echocardiogram 01/2014 with LVEF 55 to 60%  [SG]    Clinical Course User Index [SG] Elnor Jayson LABOR, DO     Admit [facility]               Additional history obtained: -Additional history obtained from family -External records from outside source obtained and reviewed including: Chart review including previous notes, labs, imaging, consultation notes including  Prior admission multiple years ago, prior labs   Lab Tests: -I ordered, reviewed, and interpreted labs.   The pertinent results include:   Labs Reviewed  COMPREHENSIVE METABOLIC PANEL WITH GFR - Abnormal; Notable for the following components:      Result Value   Sodium 133 (*)    CO2 21 (*)    Glucose, Bld 129 (*)    BUN 46 (*)    Creatinine, Ser 1.45 (*)    Calcium 8.6 (*)    Albumin 2.7 (*)    AST 48 (*)    Alkaline Phosphatase 362 (*)    Total Bilirubin 1.6 (*)    All other components within normal limits  LACTIC ACID, PLASMA - Abnormal; Notable for the following components:   Lactic Acid, Venous 2.5 (*)    All other components within normal limits  CBC WITH DIFFERENTIAL/PLATELET - Abnormal; Notable for the following components:   RBC 3.53 (*)    Hemoglobin 10.5 (*)    HCT 32.4 (*)    Platelets 104 (*)    Neutro Abs 8.7 (*)    Lymphs Abs 0.4 (*)    All other components within normal limits  CBG MONITORING, ED - Abnormal; Notable for the following components:   Glucose-Capillary 113 (*)    All other components  within normal limits  CULTURE, BLOOD (ROUTINE X 2)  CULTURE, BLOOD (ROUTINE X  2)  PROTIME-INR  AMMONIA  TSH  LACTIC ACID, PLASMA  URINALYSIS, W/ REFLEX TO CULTURE (INFECTION SUSPECTED)  PATHOLOGIST SMEAR REVIEW  T4, FREE    Notable for LA elev  EKG   EKG Interpretation Date/Time:  Thursday March 14 2024 18:55:59 EDT Ventricular Rate:  147 PR Interval:  94 QRS Duration:  119 QT Interval:  304 QTC Calculation: 476 R Axis:   82  Text Interpretation: Sinus tachycardia Nonspecific intraventricular conduction delay Low voltage, extremity leads Minimal ST depression, inferior leads Confirmed by Elnor Savant ([id number]) on 03/14/2024 7:21:51 PM         Imaging Studies ordered: I ordered imaging studies including CTAP, CTL, CXR I independently visualized the following imaging with scope of interpretation limited to determining acute life threatening conditions related to emergency care; findings noted above I agree with the radiologist interpretation If any imaging was obtained with contrast I closely monitored patient for any possible adverse reaction a/w contrast administration in the emergency department   Medicines ordered and prescription drug management: Meds ordered this encounter  Medications   sodium chloride  0.9 % bolus 1,000 mL   lactated ringers  infusion   cefTRIAXone  (ROCEPHIN ) 2 g in sodium chloride  0.9 % 100 mL IVPB    Antibiotic Indication::   CAP   azithromycin  (ZITHROMAX ) 500 mg in sodium chloride  0.9 % 250 mL IVPB    Antibiotic Indication::   CAP   sodium chloride  0.9 % bolus 1,000 mL   iohexol  (OMNIPAQUE ) 300 MG/ML solution 100 mL   morphine  (PF) 4 MG/ML injection 4 mg   ondansetron  (ZOFRAN ) injection 4 mg   vancomycin  (VANCOREADY) IVPB 1500 mg/300 mL    Indication::   Osteomyelitis   ceFEPIme (MAXIPIME) 2 g in sodium chloride  0.9 % 100 mL IVPB    Antibiotic Indication::   Osteomyelitis    -I have reviewed the patients home medicines and have made  adjustments as needed   Consultations Obtained: I requested consultation with the [facility],  and discussed lab and imaging findings as well as pertinent plan    Cardiac Monitoring: The patient was maintained on a cardiac monitor.  I personally viewed and interpreted the cardiac monitored which showed an underlying rhythm of: sinus tachy  > improving  Continuous pulse oximetry interpreted by myself, 97% on RA.    Social Determinants of Health:  Diagnosis or treatment significantly limited by social determinants of health: current smoker, IVDU   Reevaluation: After the interventions noted above, I reevaluated the patient and found that they have improved  Co morbidities that complicate the patient evaluation  Past Medical History:  Diagnosis Date   Anxiety    Back pain    Fracture of rib of right side 01/26/2015   Hypertension       Dispostion: Disposition decision including need for hospitalization was considered, and patient admitted to the hospital.    Final Clinical Impression(s) / ED Diagnoses Final diagnoses:  Community acquired pneumonia, unspecified laterality  Sepsis, due to unspecified organism, unspecified whether acute organ dysfunction present [facility])  IVDU (intravenous drug user)  Septic embolism (HCC)

## 2024-03-15 ENCOUNTER — Inpatient Hospital Stay (HOSPITAL_COMMUNITY): Payer: Self-pay

## 2024-03-15 ENCOUNTER — Other Ambulatory Visit (HOSPITAL_COMMUNITY): Payer: Self-pay | Admitting: *Deleted

## 2024-03-15 DIAGNOSIS — E8809 Other disorders of plasma-protein metabolism, not elsewhere classified: Secondary | ICD-10-CM

## 2024-03-15 DIAGNOSIS — E46 Unspecified protein-calorie malnutrition: Secondary | ICD-10-CM

## 2024-03-15 DIAGNOSIS — J189 Pneumonia, unspecified organism: Secondary | ICD-10-CM

## 2024-03-15 DIAGNOSIS — R7401 Elevation of levels of liver transaminase levels: Secondary | ICD-10-CM

## 2024-03-15 DIAGNOSIS — E872 Acidosis, unspecified: Secondary | ICD-10-CM

## 2024-03-15 DIAGNOSIS — M4657 Other infective spondylopathies, lumbosacral region: Secondary | ICD-10-CM

## 2024-03-15 DIAGNOSIS — A419 Sepsis, unspecified organism: Secondary | ICD-10-CM

## 2024-03-15 DIAGNOSIS — M4647 Discitis, unspecified, lumbosacral region: Secondary | ICD-10-CM

## 2024-03-15 DIAGNOSIS — M4620 Osteomyelitis of vertebra, site unspecified: Secondary | ICD-10-CM | POA: Insufficient documentation

## 2024-03-15 DIAGNOSIS — I2690 Septic pulmonary embolism without acute cor pulmonale: Secondary | ICD-10-CM

## 2024-03-15 DIAGNOSIS — I76 Septic arterial embolism: Secondary | ICD-10-CM

## 2024-03-15 DIAGNOSIS — R918 Other nonspecific abnormal finding of lung field: Secondary | ICD-10-CM

## 2024-03-15 DIAGNOSIS — R627 Adult failure to thrive: Secondary | ICD-10-CM

## 2024-03-15 DIAGNOSIS — L8990 Pressure ulcer of unspecified site, unspecified stage: Secondary | ICD-10-CM | POA: Insufficient documentation

## 2024-03-15 DIAGNOSIS — D696 Thrombocytopenia, unspecified: Secondary | ICD-10-CM

## 2024-03-15 DIAGNOSIS — M4627 Osteomyelitis of vertebra, lumbosacral region: Secondary | ICD-10-CM

## 2024-03-15 DIAGNOSIS — L89152 Pressure ulcer of sacral region, stage 2: Secondary | ICD-10-CM

## 2024-03-15 DIAGNOSIS — M4626 Osteomyelitis of vertebra, lumbar region: Secondary | ICD-10-CM

## 2024-03-15 DIAGNOSIS — M4646 Discitis, unspecified, lumbar region: Secondary | ICD-10-CM

## 2024-03-15 DIAGNOSIS — F17210 Nicotine dependence, cigarettes, uncomplicated: Secondary | ICD-10-CM

## 2024-03-15 DIAGNOSIS — I38 Endocarditis, valve unspecified: Secondary | ICD-10-CM

## 2024-03-15 LAB — COMPREHENSIVE METABOLIC PANEL WITH GFR
ALT: 20 U/L (ref 0–44)
AST: 37 U/L (ref 15–41)
Albumin: 2.3 g/dL — ABNORMAL LOW (ref 3.5–5.0)
Alkaline Phosphatase: 271 U/L — ABNORMAL HIGH (ref 38–126)
Anion gap: 10 (ref 5–15)
BUN: 40 mg/dL — ABNORMAL HIGH (ref 6–20)
CO2: 22 mmol/L (ref 22–32)
Calcium: 8.1 mg/dL — ABNORMAL LOW (ref 8.9–10.3)
Chloride: 104 mmol/L (ref 98–111)
Creatinine, Ser: 1.2 mg/dL (ref 0.61–1.24)
GFR, Estimated: >60 mL/min (ref >60)
Glucose, Bld: 109 mg/dL — ABNORMAL HIGH (ref 70–99)
Potassium: 4.5 mmol/L (ref 3.5–5.1)
Sodium: 136 mmol/L (ref 135–145)
Total Bilirubin: 1 mg/dL (ref 0.0–1.2)
Total Protein: 6.3 g/dL — ABNORMAL LOW (ref 6.5–8.1)

## 2024-03-15 LAB — URINE DRUG SCREEN
Amphetamines: NEGATIVE
Barbiturates: NEGATIVE
Benzodiazepines: POSITIVE — AB
Cocaine: POSITIVE — AB
Fentanyl: NEGATIVE
Methadone Scn, Ur: NEGATIVE
Opiates: POSITIVE — AB
Tetrahydrocannabinol: NEGATIVE

## 2024-03-15 LAB — PROCALCITONIN: Procalcitonin: 3.61 ng/mL

## 2024-03-15 LAB — BLOOD CULTURE ID PANEL (REFLEXED) - BCID2

## 2024-03-15 LAB — CBC
HCT: 27.4 % — ABNORMAL LOW (ref 39.0–52.0)
Hemoglobin: 8.6 g/dL — ABNORMAL LOW (ref 13.0–17.0)
MCH: 29.3 pg (ref 26.0–34.0)
MCHC: 31.4 g/dL (ref 30.0–36.0)
MCV: 93.2 fL (ref 80.0–100.0)
Platelets: 93 K/uL — ABNORMAL LOW (ref 150–400)
RBC: 2.94 MIL/uL — ABNORMAL LOW (ref 4.22–5.81)
RDW: 14 % (ref 11.5–15.5)
WBC: 6.6 K/uL (ref 4.0–10.5)
nRBC: 0 % (ref 0.0–0.2)

## 2024-03-15 LAB — STREP PNEUMONIAE URINARY ANTIGEN: Strep Pneumo Urinary Antigen: NEGATIVE

## 2024-03-15 LAB — LACTIC ACID, PLASMA: Lactic Acid, Venous: 1.7 mmol/L (ref 0.5–1.9)

## 2024-03-15 LAB — PATHOLOGIST SMEAR REVIEW

## 2024-03-15 LAB — MAGNESIUM: Magnesium: 2.7 mg/dL — ABNORMAL HIGH (ref 1.7–2.4)

## 2024-03-15 LAB — HIV ANTIBODY (ROUTINE TESTING W REFLEX): HIV Screen 4th Generation wRfx: NONREACTIVE

## 2024-03-15 LAB — T4, FREE: Free T4: 0.71 ng/dL (ref 0.61–1.12)

## 2024-03-15 LAB — PHOSPHORUS: Phosphorus: 2.9 mg/dL (ref 2.5–4.6)

## 2024-03-15 MED ORDER — PROSOURCE PLUS PO LIQD
30.0000 mL | Freq: Two times a day (BID) | ORAL | Status: DC
  Administered 2024-03-15 – 2024-03-25 (×13): 30 mL via ORAL
  Filled 2024-03-15 (×18): qty 30

## 2024-03-15 MED ORDER — SODIUM CHLORIDE 0.9 % IV SOLN
2.0000 g | Freq: Two times a day (BID) | INTRAVENOUS | Status: DC
  Administered 2024-03-15: 2 g via INTRAVENOUS
  Filled 2024-03-15 (×2): qty 12.5

## 2024-03-15 MED ORDER — ACETAMINOPHEN 650 MG RE SUPP: 650.0000 mg | Freq: Four times a day (QID) | RECTAL | Status: DC | PRN

## 2024-03-15 MED ORDER — VANCOMYCIN HCL 750 MG/150ML IV SOLN
750.0000 mg | Freq: Two times a day (BID) | INTRAVENOUS | Status: DC
  Administered 2024-03-15: 750 mg via INTRAVENOUS
  Filled 2024-03-15: qty 150

## 2024-03-15 MED ORDER — ENOXAPARIN SODIUM 40 MG/0.4ML IJ SOSY
40.0000 mg | PREFILLED_SYRINGE | INTRAMUSCULAR | Status: DC
  Administered 2024-03-15: 40 mg via SUBCUTANEOUS
  Filled 2024-03-15: qty 0.4

## 2024-03-15 MED ORDER — VITAMIN C 500 MG PO TABS
500.0000 mg | ORAL_TABLET | Freq: Two times a day (BID) | ORAL | Status: DC
  Administered 2024-03-15 – 2024-03-25 (×21): 500 mg via ORAL
  Filled 2024-03-15 (×21): qty 1

## 2024-03-15 MED ORDER — ONDANSETRON HCL 4 MG/2ML IJ SOLN
4.0000 mg | Freq: Four times a day (QID) | INTRAMUSCULAR | Status: DC | PRN
  Administered 2024-03-17 – 2024-03-20 (×2): 4 mg via INTRAVENOUS
  Filled 2024-03-15 (×2): qty 2

## 2024-03-15 MED ORDER — ADULT MULTIVITAMIN W/MINERALS CH
1.0000 | ORAL_TABLET | Freq: Every day | ORAL | Status: DC
  Administered 2024-03-15 – 2024-03-25 (×11): 1 via ORAL
  Filled 2024-03-15 (×11): qty 1

## 2024-03-15 MED ORDER — ZINC SULFATE 220 (50 ZN) MG PO CAPS
220.0000 mg | ORAL_CAPSULE | Freq: Every day | ORAL | Status: DC
Start: 2024-03-15 — End: 2024-03-26
  Administered 2024-03-15 – 2024-03-25 (×11): 220 mg via ORAL
  Filled 2024-03-15 (×11): qty 1

## 2024-03-15 MED ORDER — ENSURE PLUS HIGH PROTEIN PO LIQD
237.0000 mL | Freq: Two times a day (BID) | ORAL | Status: DC
  Administered 2024-03-15: 237 mL via ORAL

## 2024-03-15 MED ORDER — SODIUM CHLORIDE 0.9 % IV SOLN: 500.0000 mg | INTRAVENOUS | Status: DC

## 2024-03-15 MED ORDER — HYDROMORPHONE HCL 1 MG/ML IJ SOLN
0.5000 mg | INTRAMUSCULAR | Status: DC | PRN
  Administered 2024-03-15 – 2024-03-16 (×3): 0.5 mg via INTRAVENOUS
  Filled 2024-03-15 (×5): qty 0.5

## 2024-03-15 MED ORDER — DM-GUAIFENESIN ER 30-600 MG PO TB12
1.0000 | ORAL_TABLET | Freq: Two times a day (BID) | ORAL | Status: DC
  Administered 2024-03-15 – 2024-03-25 (×21): 1 via ORAL
  Filled 2024-03-15 (×21): qty 1

## 2024-03-15 MED ORDER — ORAL CARE MOUTH RINSE: 15.0000 mL | OROMUCOSAL | Status: DC | PRN

## 2024-03-15 MED ORDER — GADOBUTROL 1 MMOL/ML IV SOLN
6.0000 mL | Freq: Once | INTRAVENOUS | Status: AC | PRN
Start: 2024-03-15 — End: 2024-03-15
  Administered 2024-03-15: 6 mL via INTRAVENOUS

## 2024-03-15 MED ORDER — ONDANSETRON HCL 4 MG PO TABS
4.0000 mg | ORAL_TABLET | Freq: Four times a day (QID) | ORAL | Status: DC | PRN
  Administered 2024-03-25: 4 mg via ORAL
  Filled 2024-03-15: qty 1

## 2024-03-15 MED ORDER — ENSURE PLUS HIGH PROTEIN PO LIQD
237.0000 mL | Freq: Three times a day (TID) | ORAL | Status: DC
  Administered 2024-03-15 – 2024-03-19 (×3): 237 mL via ORAL
  Administered 2024-03-20: 1 via ORAL
  Administered 2024-03-21 – 2024-03-25 (×6): 237 mL via ORAL

## 2024-03-15 MED ORDER — ALPRAZOLAM 1 MG PO TABS
1.0000 mg | ORAL_TABLET | Freq: Once | ORAL | Status: AC
  Administered 2024-03-15: 1 mg via ORAL
  Filled 2024-03-15: qty 1

## 2024-03-15 MED ORDER — CEFAZOLIN SODIUM-DEXTROSE 2-4 GM/100ML-% IV SOLN
2.0000 g | Freq: Three times a day (TID) | INTRAVENOUS | Status: DC
  Administered 2024-03-15 – 2024-03-20 (×14): 2 g via INTRAVENOUS
  Filled 2024-03-15 (×15): qty 100

## 2024-03-15 MED ORDER — VANCOMYCIN HCL 1750 MG/350ML IV SOLN
1750.0000 mg | INTRAVENOUS | Status: DC
  Administered 2024-03-15: 1750 mg via INTRAVENOUS
  Filled 2024-03-15: qty 350

## 2024-03-15 MED ORDER — ACETAMINOPHEN 325 MG PO TABS
650.0000 mg | ORAL_TABLET | Freq: Four times a day (QID) | ORAL | Status: DC | PRN
  Administered 2024-03-15 – 2024-03-24 (×3): 650 mg via ORAL
  Filled 2024-03-15 (×3): qty 2

## 2024-03-15 NOTE — Evaluation (Addendum)
 Occupational Therapy Evaluation Patient Details Name: [name] [name] MRN: [id number] DOB: 01-Oct-1975 Today's Date: 03/15/2024   History of Present Illness   [name] [name] is a 48 y.o. male with medical history significant of anxiety disorder, tobacco abuse, chronic back pain who presents to the emergency department from home due to generalized weakness and poor oral intake.  Patient complained of hurting his back about 5 weeks ago and he has been laying in bed most of the time for about a month.  He believed that he aggravated the back from sleeping in an uncomfortable position.  He denied any falls.  About a week ago, wife at bedside reports poor oral intake but has been tolerating liquids without any difficulty.  He denies dysphagia.  Patient has also been having productive cough of rusty colored sputum and subjective fever within the last few days.  Patient denies any IV drug use in the last 6 months, alcohol use in past few months and cigarettes use in the past few days. (per DO)     Clinical Impressions Pt agreeable to OT and PT co-evaluation. Pt needing use of RW over the past 4 days. Today pt required min A for sit to stand with more CGA for steps to chair and ambulation in the hall. R UE weak compared to L UE without pt knowing a reason for this weakness. Pt able to complete ADL's well seated. CGA likely needed for standing ADL's. Pt left in the bed with bed alarm set.  Pt removed from supplemental O2 due to saturation remaining above 90% for most of session. Pt will benefit from continued OT in the hospital to increase strength, balance, and endurance for safe ADL's.        If plan is discharge home, recommend the following:   A little help with walking and/or transfers;A little help with bathing/dressing/bathroom;Assistance with cooking/housework;Assist for transportation;Help with stairs or ramp for entrance     Functional Status Assessment   Patient has had a recent decline  in their functional status and demonstrates the ability to make significant improvements in function in a reasonable and predictable amount of time.     Equipment Recommendations   None recommended by OT             Precautions/Restrictions   Precautions Precautions: Fall Recall of Precautions/Restrictions: Intact Restrictions Weight Bearing Restrictions Per Provider Order: No     Mobility Bed Mobility Overal bed mobility: Needs Assistance Bed Mobility: Sidelying to Sit, Rolling Rolling: Contact guard assist, Used rails Sidelying to sit: Min assist, Mod assist, HOB elevated       General bed mobility comments: Assist to bring torso to upright position with use of railing and HOB elevated.    Transfers Overall transfer level: Needs assistance Equipment used: Rolling walker (2 wheels) Transfers: Sit to/from Stand, Bed to chair/wheelchair/BSC Sit to Stand: Min assist, Contact guard assist     Step pivot transfers: Contact guard assist     General transfer comment: EOB to chair with RW; slow and labored      Balance Overall balance assessment: Needs assistance Sitting-balance support: No upper extremity supported, Feet supported Sitting balance-Leahy Scale: Fair Sitting balance - Comments: seated at EOB   Standing balance support: Bilateral upper extremity supported, During functional activity, Reliant on assistive device for balance Standing balance-Leahy Scale: Fair Standing balance comment: using RW  ADL either performed or assessed with clinical judgement   ADL Overall ADL's : Needs assistance/impaired     Grooming: Contact guard assist;Minimal assistance;Standing       Lower Body Bathing: Set up;Sitting/lateral leans       Lower Body Dressing: Set up;Sitting/lateral leans   Toilet Transfer: Contact guard assist;Minimal assistance;Rolling walker (2 wheels);Stand-pivot Statistician Details (indicate cue  type and reason): Simulated via EOB to chair transfer with RW.         Functional mobility during ADLs: Contact guard assist;Rolling walker (2 wheels)       Vision Baseline Vision/History: 1 Wears glasses Ability to See in Adequate Light: 1 Impaired Patient Visual Report: Blurring of vision Vision Assessment?: Yes Additional Comments: No obvious impairments other than pt struggling some to read the board ~8 feet away. Pt reports recent blurry vision.     Perception Perception: Not tested       Praxis Praxis: Not tested       Pertinent Vitals/Pain Pain Assessment Pain Assessment: 0-10 Pain Score: 8  Pain Location: back Pain Descriptors / Indicators: Other (Comment) (excruciating) Pain Intervention(s): Limited activity within patient's tolerance, Monitored during session, Repositioned     Extremity/Trunk Assessment Upper Extremity Assessment Upper Extremity Assessment: Generalized weakness;Left hand dominant;RUE deficits/detail RUE Deficits / Details: 3+/5 shoulder flexion; much weaker compared to L UE. Pt is not sure why R shoulder is weak. 4+/5 gross grasp.   Lower Extremity Assessment Lower Extremity Assessment: Defer to PT evaluation   Cervical / Trunk Assessment Cervical / Trunk Assessment: Kyphotic   Communication Communication Communication: No apparent difficulties   Cognition Arousal: Alert Behavior During Therapy: WFL for tasks assessed/performed Cognition: No apparent impairments                               Following commands: Intact       Cueing  General Comments   Cueing Techniques: Verbal cues;Tactile cues                 Home Living Family/patient expects to be discharged to:: Private residence Living Arrangements: Spouse/significant other Available Help at Discharge: Family;Available 24 hours/day Type of Home: House Home Access: Stairs to enter [employer] of Steps: half step into house Entrance  Stairs-Rails: None Home Layout: One level     Bathroom Shower/Tub: Chief Strategy Officer: Standard Bathroom Accessibility: Yes How Accessible: Accessible via wheelchair;Accessible via walker Home Equipment: Rolling Walker (2 wheels);Wheelchair - manual;BSC/3in1;Cane - single point;Tub bench          Prior Functioning/Environment Prior Level of Function : Independent/Modified Independent;Driving             Mobility Comments: 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 ADLs Comments: Independent    OT Problem List: Decreased strength;Decreased range of motion;Decreased activity tolerance;Impaired balance (sitting and/or standing);Impaired vision/perception;Pain   OT Treatment/Interventions: Self-care/ADL training;Therapeutic exercise;Therapeutic activities;Patient/family education;Balance training      OT Goals(Current goals can be found in the care plan section)   Acute Rehab OT Goals Patient Stated Goal: return home OT Goal Formulation: With patient Time For Goal Achievement: 03/29/24 Potential to Achieve Goals: Good   OT Frequency:  Min 2X/week    Co-evaluation PT/OT/SLP Co-Evaluation/Treatment: Yes Reason for Co-Treatment: To address functional/ADL transfers   OT goals addressed during session: ADL's and self-care  End of Session Equipment Utilized During Treatment: Rolling walker (2 wheels);Gait belt Nurse Communication: Other (comment) (Mobility and position.)  Activity Tolerance: Patient tolerated treatment well Patient left: in bed;with call bell/phone within reach  OT Visit Diagnosis: Unsteadiness on feet (R26.81);Other abnormalities of gait and mobility (R26.89);Muscle weakness (generalized) (M62.81)                Time: 9087-9045 OT Time Calculation (min): 42 min Charges:  OT General Charges $OT Visit: 1 Visit OT Evaluation $OT Eval Low Complexity: 1 Low  Lukasz Rogus OT,  MOT  Jayson Person 03/15/2024, 10:46 AM

## 2024-03-15 NOTE — Hospital Course (Addendum)
 [name] [name] is a 48 y.o. male with medical history significant of anxiety disorder, tobacco abuse, chronic back pain who presents to the emergency department from home due to generalized weakness and poor oral intake. Patient complained of hurting his back about 5 weeks ago and he has been laying in bed most of the time for about a month.  He believed that he aggravated the back from sleeping in an uncomfortable position.  He denied any falls. For about a week now, wife at bedside reports poor oral intake but has been tolerating liquids without any difficulty.  He denies dysphagia.  But having productive cough of rusty colored sputum and subjective fever within the last few days. Patient denies any IV drug use in the last 6 months, alcohol use in past few months and cigarettes use in the past few days.   ED Course:  Patient was tachypneic, tachycardic, but other vital signs are within normal range.  Workup in the ED showed normocytic anemia, thrombocytopenia.  BMP shows sodium 133, potassium 4.8, chloride 98, bicarb 21, blood glucose 129, BUN 46, creatinine 1.45 (last lab for comparison was creatinine of 0.88 and this was 7 years ago), albumin 2.7, AST 48, ALT 23, ALP 362, total bilirubin 1.6, lactic acid 2.5 > 1.7.  TSH 1.730, ammonia 28.  Blood culture pending CT abdomen pelvis showed small cavitary lung base nodules, most likely due to multifocal septic lung emboli, most of them continue fluid compatible with these being abscesses.  Small pericardial effusion. Chest x-ray showed patchy nodular airspace opacities in both lungs, concerning for infection Patient was treated with IV ceftriaxone  and azithromycin  were given for pneumonia, cefepime and vancomycin  were given for osteomyelitis of the spine. morphine  was given, and Zofran .        Assessment & Plan:   Principal Problem:   Sepsis due to pneumonia [facility]) Active Problems:   Septic embolism (HCC)   Pressure injury of skin   Lactic  acidosis   Osteomyelitis of lumbar spine (HCC)   Thrombocytopenia   Transaminasemia   Hypoalbuminemia   Failure to thrive in adult   Sepsis due to pneumonia/ small Septic embolism /possible bacteremia Still tachycardic with heart rate of 111, blood pressure soft but stable 107/67 Otherwise hemodynamically stable Pulmonary blood cultures reporting gram-positive cocci  POA: Patient met sepsis criteria due to being tachycardic, tachypneic -   Chest x-ray was suggestive of infection and CT abdomen and pelvis was suggestive of septic embolism  -  IV hydration per sepsis protocol was provided -Was started on IV ceftriaxone  and azithromycin  in ED. Will Broaden antibiotics to  Cefepime and Vancomycin   Preliminary bronc cultures gram-positive cocci >>  Will follow-up with sputum culture, urine Legionella, strep pneumo and procalcitonin 3.61 -Lactic acid 2.5>>> 1.7 Continue Tylenol  as needed Continue Mucinex , incentive spirometry, flutter valve    Lactic acidosis secondary to above -resolved Lactic acid 2.5 > 1.7   Osteomyelitis of the lumbar spine CT abdomen and pelvis was suggestive of discitis/osteomyelitis of posttraumatic change Patient was empirically started on IV vancomycin  and Cefepime,  -- Consulted ID -appreciate input from Dr.Vu   -Cultures growing gram-positive cocci>> continue Vanco + cefepime  Continue IV Dilaudid  0.5 mg every 3 hours as needed Continue fall precaution Continue PT/OT eval and treat  MRI of lumbar spine: There is discitis/osteomyelitis at L3-L4 and L5-S1 with a septic right facet joint at L5-S1. There is paraspinal inflammation with small bilateral psoas abscesses.   The bone marrow signal is  diffusely abnormal, possibly due to anemia.   Consulting ID/and neurosurgery-appreciate input1   Thrombocytopenia/  Transaminasemia POA: Platelets 104.  AST 48, ALT 23, ALP 362    Latest Ref Rng & Units 03/15/2024    4:11 AM 03/14/2024    7:49 PM 11/08/2016     4:55 AM  Hepatic Function  Total Protein 6.5 - 8.1 g/dL 6.3  7.7  6.6   Albumin 3.5 - 5.0 g/dL 2.3  2.7  3.0   AST 15 - 41 U/L 37  48  34   ALT 0 - 44 U/L 20  23  22    Alk Phosphatase 38 - 126 U/L 271  362  41   Total Bilirubin 0.0 - 1.2 mg/dL 1.0  1.6  0.9    CT abdomen and pelvis showed mild hepatic steatosis with portal spinal megaly and increased prominence of the hepatic portal vein and splenic vein    Hypoalbuminemia possibly due to moderate protein calorie malnutrition   Failure to thrive in adult Albumin 2.7, protein supplement will be provided Continue full liquid diet with plan to advance diet as tolerated Dietitian will be consulted and we shall await further recommendations   Pressure ulcer to sacrum-stage II Wound nurse will be consulted

## 2024-03-15 NOTE — Progress Notes (Signed)
 Initial Nutrition Assessment  DOCUMENTATION CODES:   Not applicable  INTERVENTION:   -Continue full liquid diet; RD will follow for diet advancement and adjust supplement regimen as appropriate -Ensure Plus High Protein po TID, each supplement provides 350 kcal and 20 grams of protein  -30 ml Prosource Plus BID, each supplement provides 100 kcals and 15 grams protein -MVI with minerals daily -500 mg vitamin C BID -220 mg zinc sulfate daily x 14 days -RD will draw labs to rule out potential micronutrient deficiencies which may impede wound healing: vitamin A  NUTRITION DIAGNOSIS:   Increased nutrient needs related to wound healing as evidenced by estimated needs.  GOAL:   Patient will meet greater than or equal to 90% of their needs  MONITOR:   PO intake, Supplement acceptance, Diet advancement  REASON FOR ASSESSMENT:   Consult Assessment of nutrition requirement/status  ASSESSMENT:   Pt with medical history significant of anxiety disorder, tobacco abuse, chronic back pain who presents due to generalized weakness and poor oral intake.  Pt admitted with sepsis and pneumonia.   Reviewed I/O's: +3.5 L x 24 hours  UOP: 350 ml x 24 hours  Pt unavailable at time of visit. Attempted to speak with pt via call to hospital room phone, however, unable to reach. RD unable to obtain further nutrition-related history or complete nutrition-focused physical exam at this time.    Per [facility] notes, pt with DPTI to sacrum.  Case discussed with RN. Pt just had breakfast and stated pt accepted Ensure that has been ordered. Per H&P, pt has had poor oral intake for 1 week PTA, but has been drinking mainly liquids.  Case discussed in interdisciplinary rounds. Per MD, pt is bedbound at baseline secondary to severe back pain. Plan for aggressive OT/PT; pt may require SNF placement, however, is uninsured. Possible plan to discharge Monday.  Per ID notes, imaging suggests infectious process with  septic emboli and spinal involvement. Metastatic cancer also being considered. Plan for L2-L4 biopsy with IR. Pt currently on antibiotics and awaiting cultures.   Noted pt with distant history of weight loss, however, no recent wt available to assess weight changes.   Highly suspect pt with malnutrition, however, unable to identify at this time. Pt would greatly benefit from addition of oral nutrition supplements.   Labs reviewed.   Diet Order:   Diet Order             Diet full liquid Room service appropriate? Yes; Fluid consistency: Thin  Diet effective now                   EDUCATION NEEDS:   No education needs have been identified at this time  Skin:  Skin Assessment: Skin Integrity Issues: Skin Integrity Issues:: DTI DTI: sacrum  Last BM:  03/14/24  Height:   Ht Readings from Last 1 Encounters:  03/14/24 6' (1.829 m)    Weight:   Wt Readings from Last 1 Encounters:  03/15/24 62.5 kg    Ideal Body Weight:  80.9 kg  BMI:  Body mass index is 18.69 kg/m.  Estimated Nutritional Needs:   Kcal:  2150-2350  Protein:  125-150 grams  Fluid:  2.0-2.2 L    Margery ORN, RD, LDN, CDCES Registered Dietitian III Certified Diabetes Care and Education Specialist If unable to reach this RD, please use RD Inpatient group chat on secure chat between hours of 8am-4 pm daily

## 2024-03-15 NOTE — Plan of Care (Addendum)
 Pt alert to at times. When speak his name he awakens. Drowsy off and on. Vitals stable. Oxygen at 2L/[place] sats 97 %. Condom cath placed due to urgency incontinence. Foam to sacrum. Orders for new dsg change for 0900. MD aware and wound care consult placed and completed.  Problem: Education: Goal: Knowledge of General Education information will improve Description: Including pain rating scale, medication(s)/side effects and non-pharmacologic comfort measures Outcome: Progressing   Problem: Health Behavior/Discharge Planning: Goal: Ability to manage health-related needs will improve Outcome: Progressing   Problem: Clinical Measurements: Goal: Ability to maintain clinical measurements within normal limits will improve Outcome: Progressing Goal: Will remain free from infection Outcome: Progressing Goal: Diagnostic test results will improve Outcome: Progressing Goal: Respiratory complications will improve Outcome: Progressing Goal: Cardiovascular complication will be avoided Outcome: Progressing   Problem: Activity: Goal: Risk for activity intolerance will decrease Outcome: Progressing   Problem: Nutrition: Goal: Adequate nutrition will be maintained Outcome: Progressing   Problem: Coping: Goal: Level of anxiety will decrease Outcome: Progressing   Problem: Elimination: Goal: Will not experience complications related to bowel motility Outcome: Progressing Goal: Will not experience complications related to urinary retention Outcome: Progressing   Problem: Pain Managment: Goal: General experience of comfort will improve and/or be controlled Outcome: Progressing   Problem: Safety: Goal: Ability to remain free from injury will improve Outcome: Progressing   Problem: Skin Integrity: Goal: Risk for impaired skin integrity will decrease Outcome: Progressing   Problem: Activity: Goal: Ability to tolerate increased activity will improve Outcome: Progressing   Problem: Clinical  Measurements: Goal: Ability to maintain a body temperature in the normal range will improve Outcome: Progressing   Problem: Respiratory: Goal: Ability to maintain adequate ventilation will improve Outcome: Progressing Goal: Ability to maintain a clear airway will improve Outcome: Progressing

## 2024-03-15 NOTE — Consult Note (Signed)
 [facility]    Date of Admission:  03/14/2024     Reason for Consult: imaging vertebral om and pulm septic emboli    Referring Provider: Dianna     Lines:  peripheral  Abx: 10/2-c vanc 10/2-c cefepime        Assessment: 48 yo male smoker, anxiety, substance abuse admitted to [facility] 10/2 for malaise/decreased appetite and 5 weeks of progressive back pain, and most recently productive cough of a few days, found to have multiple pulm emboli, l2-l4 spine/paraspinal soft tissue imaging abnormality, concerning for infection vs metastatic cancer  Denies use last 6 months however, uds show opiate, benzo,and cocaine appear actively using ?heavy alcohol use last few months   Ct abd pelv numerous pulm nodule with ?fluid signal concerning for abscesses. L2-L4 spine/paraspinal signal changes as mentioned  He was afebrile on admission without leukocytosis. There is thrombocytopenia. Albumin was low in the 2's. Coagulation panel was normal  10/2 bcx ngtd 10/3 hiv screen negative  While sx and imaging does suggest infectious process (suspect staphylococcus aureus) with septic emboli/spinous involvement, certainly metastatic cancer could be considered   Mild cr elevation appears to be improving; prerenal in picture more so than atn or intrarenal process  Plan: F/u bcx Would discuss with ir if they can biopsy the l2-l4 level and ideally if enough tissue can be obtained to send for pathology besides bacterial, fungal, afb culture  Please get dedicated chest ct and mri lumbar spine wwo contrast Tte Please inquire of any prosthetics/cardiac device present and assess for any focal joint/other mid back pain so we can potentially w/u for metastatic complication -- again I suspect this is a staphylococcus aureus diffuse infection Reasonable to continue vanc/cefepime for now Dc azithromycin , this is not your typical CAP Maintain standard isolation  precaution Discuss with primary team   I spent more than 25 minute reviewing data/chart, and coordinating care, providing diagnostics/treatment plan with treatment team  ------------------------------------------------ Principal Problem:   Sepsis due to pneumonia [facility]) Active Problems:   Septic embolism (HCC)   Pressure injury of skin   Lactic acidosis   Osteomyelitis of lumbar spine (HCC)   Thrombocytopenia   Transaminasemia   Hypoalbuminemia   Failure to thrive in adult    HPI: [name] [name] is a 48 y.o. male smoker, anxiety, substance abuse admitted to [facility] 10/2 for malaise/decreased appetite and 5 weeks of progressive back pain, and most recently productive cough of a few days, found to have multiple pulm emboli, l2-l4 spine/paraspinal soft tissue imaging abnormality, concerning for infection vs metastatic cancer  Chart reviewed  Lives with wife  Came in due to progressive sx and new cough  Subjective f/c?  Afebrile here Uds cocaine, opiate, benzo Ct abd pelv with l spine reconstruction as below Bcx cooking Started on bsAbx including azith for cap  Mild thrombocytopenia and aki latter is improving  Hiv screen negative  Known id hx: 2015 mssa bsi; negative tee; ivdu context    History reviewed. No pertinent family history.  Social History   Tobacco Use   Smoking status: Every Day    Current packs/day: 0.50    Types: Cigarettes   Smokeless tobacco: Never  Substance Use Topics   Alcohol use: No    Comment: rare   Drug use: Yes    Types: Heroin    Comment: reports history of cocaine abuse.-last used years ago    Allergies  Allergen  Reactions   Penicillins Other (See Comments)    Unknown reaction    Review of Systems: ROS All Other ROS was negative, except mentioned above   Past Medical History:  Diagnosis Date   Anxiety    Back pain    Fracture of rib of right side 01/26/2015   Hypertension        Scheduled Meds:   dextromethorphan-guaiFENesin   1 tablet Oral BID   feeding supplement  237 mL Oral BID BM   Continuous Infusions:  azithromycin      ceFEPime (MAXIPIME) IV 2 g (03/15/24 1012)   lactated ringers  150 mL/hr at 03/15/24 0612   vancomycin  750 mg (03/15/24 0829)   PRN Meds:.acetaminophen  **OR** acetaminophen , HYDROmorphone  (DILAUDID ) injection, ondansetron  **OR** ondansetron  (ZOFRAN ) IV   OBJECTIVE: Blood pressure 117/80, pulse (!) 109, temperature 100.2 F (37.9 C), resp. rate 18, height 6' (1.829 m), weight 62.5 kg, SpO2 97%.  Physical Exam Reviewed H&P and progress note No rash noted Lumbar spine area tenderness   Lab Results Lab Results  Component Value Date   WBC 6.6 03/15/2024   HGB 8.6 (L) 03/15/2024   HCT 27.4 (L) 03/15/2024   MCV 93.2 03/15/2024   PLT 93 (L) 03/15/2024    Lab Results  Component Value Date   CREATININE 1.20 03/15/2024   BUN 40 (H) 03/15/2024   NA 136 03/15/2024   K 4.5 03/15/2024   CL 104 03/15/2024   CO2 22 03/15/2024    Lab Results  Component Value Date   ALT 20 03/15/2024   AST 37 03/15/2024   ALKPHOS 271 (H) 03/15/2024   BILITOT 1.0 03/15/2024      Microbiology: Recent Results (from the past 240 hours)  Culture, blood (Routine x 2)     Status: None (Preliminary result)   Collection Time: 03/14/24  7:34 PM   Specimen: Left Antecubital; Blood  Result Value Ref Range Status   Specimen Description LEFT ANTECUBITAL  Final   Special Requests   Final    BOTTLES DRAWN AEROBIC ONLY Blood Culture results may not be optimal due to an inadequate volume of blood received in culture bottles   Culture   Final    NO GROWTH < 24 HOURS Performed at [facility], [address], [place], [place] [address]    Report Status PENDING  Incomplete  Culture, blood (Routine x 2)     Status: None (Preliminary result)   Collection Time: 03/14/24 11:21 PM   Specimen: BLOOD  Result Value Ref Range Status   Specimen Description BLOOD BLOOD RIGHT ARM  Final    Special Requests AEROBIC BOTTLE ONLY Blood Culture adequate volume  Final   Culture   Final    NO GROWTH < 12 HOURS Performed at [facility], [address] [place], [place] [address]    Report Status PENDING  Incomplete     Serology:    Imaging: If present, new imagings (plain films, ct scans, and mri) have been personally visualized and interpreted; radiology reports have been reviewed. Decision making incorporated into the Impression / Recommendations.  03/15/24 ct L spine reconstruction -- abd/pelv ct 1. Numerous small cavitary lung base nodules, most likely due to multifocal septic lung emboli, most of them containing fluid compatible with these being abscesses. 2. Small pericardial effusion. 3. Generalized mesenteric congestive changes, small volume of pelvic ascites, and mild body wall anasarca. 4. Mild hepatic steatosis with hepatosplenomegaly and increased prominence of the hepatic portal vein and splenic vein. 5. Cystitis versus bladder hypertrophy  or underdistention. 6. Age advanced aortoiliac atherosclerosis. 7. L2-3 disc space loss with small anterior and posterior peridiscal endplate lucencies with slight fragmentation. There is paraspinal soft tissue thickening at this level which could be due to paraspinal phlegmon or posttraumatic reactive soft tissue change. 8. L4-5 peridiscal endplate fragmentation and subcortical lucency to the right, with paraspinal soft tissue thickening on the right. Findings could be due to discitis/osteomyelitis or posttraumatic change. 9. Nondisplaced bony fragmentation of the anterior superior L1 vertebral body without height loss. This is almost certainly due to trauma. 10. L2-3 left paracentral to left far lateral disc bulge with moderate to severe left foraminal stenosis and moderate effacement of the left subarticular zone. 11. L4-5 mild subarticular zone encroachment. 12. Lumbar MRI without and with contrast may be helpful to  further characterize these findings.  [name] ONEIDA Passer, MD [facility] [phone] pager    03/15/2024, 10:16 AM

## 2024-03-15 NOTE — TOC CM/SW Note (Signed)
 Transition of Care [facility]) - Inpatient Brief Assessment   Patient Details  Name: [name] [name] MRN: [id number] Date of Birth: 02-May-1976  Transition of Care [facility]) CM/SW Contact:    Noreen KATHEE Cleotilde ISRAEL Phone Number: 03/15/2024, 8:31 AM   Clinical Narrative:  Inpatient Care Management (ICM) has reviewed patient and no other ICM needs have been identified at this time. We will continue to monitor patient advancement through interdisciplinary progression rounds. If new patient transition needs arise, please place a ICM consult.  Transition of Care Asessment: Insurance and Status: Selfpay Patient has primary care physician: Yes Home environment has been reviewed: Single Family Home Prior level of function:: Independent Prior/Current Home Services: No current home services Social Drivers of Health Review: SDOH reviewed no interventions necessary Readmission risk has been reviewed: Yes Transition of care needs: no transition of care needs at this time

## 2024-03-15 NOTE — Significant Event (Signed)
       CROSS COVER NOTE  NAME: [name] [name] MRN: [id number] DOB : 1976-05-15 ATTENDING PHYSICIAN: Willette Adriana LABOR, MD    Date of Service   03/15/2024   HPI/Events of Note   Remote cross cover for [facility] received from nurse Hi I received a call from microbiology over at [facility]. Pt BCID came back with Staff Aureus, 1 of 2 bottles, no resistance mechanisms detected. Just wanted to make you aware.   PMH - 48 yo M with chronic back pin, anxiety disorder and tobacco abuse who presented to ER with 1 week history of poor oral intake but tolerating liquids, productive cough and subjective fever. Initial workup showed concern for sepsis with imaging concerning imaging as follows CT Abdomen and pelvis with contrast IMPRESSION: 1. Numerous small cavitary lung base nodules, most likely due to multifocal septic lung emboli, most of them containing fluid compatible with these being abscesses. 2. Small pericardial effusion. 3. Generalized mesenteric congestive changes, small volume of pelvic ascites, and mild body wall anasarca. 4. Mild hepatic steatosis with hepatosplenomegaly and increased prominence of the hepatic portal vein and splenic vein. 5. Cystitis versus bladder hypertrophy or underdistention. 6. Age advanced aortoiliac atherosclerosis. 7. L2-3 disc space loss with small anterior and posterior peridiscal endplate lucencies with slight fragmentation. There is paraspinal soft tissue thickening at this level which could be due to paraspinal phlegmon or posttraumatic reactive soft tissue change. 8. L4-5 peridiscal endplate fragmentation and subcortical lucency to the right, with paraspinal soft tissue thickening on the right. Findings could be due to discitis/osteomyelitis or posttraumatic change. 9. Nondisplaced bony fragmentation of the anterior superior L1 vertebral body without height loss. This is almost certainly due to trauma. 10. L2-3 left paracentral to  left far lateral disc bulge with moderate to severe left foraminal stenosis and moderate effacement of the left subarticular zone. 11. L4-5 mild subarticular zone encroachment. 12. Lumbar MRI without and with contrast may be helpful to further characterize these findings.   Aortic Atherosclerosis (ICD10-I70.0).    MRI Lumbar spin with and without contrast  IMPRESSION: There is discitis/osteomyelitis at L3-L4 and L5-S1 with a septic right facet joint at L5-S1.   There is paraspinal inflammation with small bilateral psoas abscesses.   The bone marrow signal is diffusely abnormal, possibly due to anemia. A diffuse infiltrative process is possible but considered less likely.  Currently on Vancomycin  due to concern for MRSA per notes   Interventions   Assessment/Plan: Antibiotic changed to cefazolin  2 gm every 8 h Vancomycin  discintinued       Erminio LITTIE Cone NP [facility] Cross Cover 7pm-7am - check amion for availability Pager [phone]

## 2024-03-15 NOTE — Evaluation (Signed)
 Physical Therapy Evaluation Patient Details Name: [name] [name] MRN: [id number] DOB: Jun 21, 1975 Today's Date: 03/15/2024  History of Present Illness  [name] [name] is a 48 y.o. male with medical history significant of anxiety disorder, tobacco abuse, chronic back pain who presents to the emergency department from home due to generalized weakness and poor oral intake.  Patient complained of hurting his back about 5 weeks ago and he has been laying in bed most of the time for about a month.  He believed that he aggravated the back from sleeping in an uncomfortable position.  He denied any falls.  About a week ago, wife at bedside reports poor oral intake but has been tolerating liquids without any difficulty.  He denies dysphagia.  Patient has also been having productive cough of rusty colored sputum and subjective fever within the last few days.  Patient denies any IV drug use in the last 6 months, alcohol use in past few months and cigarettes use in the past few days.   Clinical Impression  Pt was agreeable to PT and OT co-evaluation. Pt required minA w/ sit to stand transfer, but this was able to be reduced to CGA with ambulation and step pivot transfers. He exhibited limited stride length and poor foot clearance with ambulation. He was removed from supplemental O2 as his SpO2 was able to remain above 90% throughout today's session. He was left in bed with the call bell within reach, bed alarm set, and family present. Patient will benefit from continued skilled physical therapy in hospital and recommended venue below to increase strength, balance, endurance for safe ADLs and gait.       If plan is discharge home, recommend the following: A little help with walking and/or transfers;A little help with bathing/dressing/bathroom;Assist for transportation;Assistance with cooking/housework;Help with stairs or ramp for entrance   Can travel by private vehicle        Equipment Recommendations None  recommended by PT  Recommendations for Other Services       Functional Status Assessment Patient has had a recent decline in their functional status and demonstrates the ability to make significant improvements in function in a reasonable and predictable amount of time.     Precautions / Restrictions Precautions Precautions: Fall Recall of Precautions/Restrictions: Intact Restrictions Weight Bearing Restrictions Per Provider Order: No      Mobility  Bed Mobility Overal bed mobility: Needs Assistance Bed Mobility: Sidelying to Sit, Rolling Rolling: Contact guard assist, Used rails Sidelying to sit: Min assist, Mod assist, HOB elevated       General bed mobility comments: Assist to bring torso to upright position with use of railing and HOB elevated.    Transfers Overall transfer level: Needs assistance Equipment used: Rolling walker (2 wheels) Transfers: Sit to/from Stand, Bed to chair/wheelchair/BSC Sit to Stand: Min assist, Contact guard assist   Step pivot transfers: Contact guard assist       General transfer comment: EOB to chair with RW; slow and labored    Ambulation/Gait Ambulation/Gait assistance: Contact guard assist, Min assist Gait Distance (Feet): 20 Feet Assistive device: Rolling walker (2 wheels) Gait Pattern/deviations: Step-to pattern, Decreased stride length Gait velocity: slow     General Gait Details: poor foot clearance; instability while turning  Stairs            Wheelchair Mobility     Tilt Bed    Modified Rankin (Stroke Patients Only)       Balance Overall balance assessment: Needs assistance Sitting-balance  support: No upper extremity supported, Feet supported Sitting balance-Leahy Scale: Fair Sitting balance - Comments: seated at EOB   Standing balance support: Bilateral upper extremity supported, During functional activity, Reliant on assistive device for balance Standing balance-Leahy Scale: Fair Standing balance  comment: using RW                             Pertinent Vitals/Pain Pain Assessment Pain Assessment: 0-10 Pain Score: 8  Pain Location: back Pain Descriptors / Indicators: Other (Comment) (excruciating) Pain Intervention(s): Limited activity within patient's tolerance, Monitored during session, Repositioned    Home Living Family/patient expects to be discharged to:: Private residence Living Arrangements: Spouse/significant other Available Help at Discharge: Family;Available 24 hours/day Type of Home: House Home Access: Stairs to enter Entrance Stairs-Rails: None Entrance Stairs-Number of Steps: half step into house   Home Layout: One level Home Equipment: Agricultural consultant (2 wheels);Wheelchair - manual;BSC/3in1;Cane - single point;Tub bench      Prior Function Prior Level of Function : Independent/Modified Independent;Driving             Mobility Comments: 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 ADLs Comments: Independent     Extremity/Trunk Assessment   Upper Extremity Assessment Upper Extremity Assessment: Defer to OT evaluation RUE Deficits / Details: 3+/5 shoulder flexion; much weaker compared to L UE. Pt is not sure why R shoulder is weak. 4+/5 gross grasp.    Lower Extremity Assessment Lower Extremity Assessment: Generalized weakness    Cervical / Trunk Assessment Cervical / Trunk Assessment: Kyphotic  Communication   Communication Communication: No apparent difficulties    Cognition Arousal: Alert Behavior During Therapy: WFL for tasks assessed/performed   PT - Cognitive impairments: No apparent impairments                         Following commands: Intact       Cueing Cueing Techniques: Verbal cues, Tactile cues     General Comments      Exercises     Assessment/Plan    PT Assessment Patient needs continued PT services  PT Problem List Decreased strength;Decreased activity tolerance;Decreased  balance;Decreased mobility       PT Treatment Interventions DME instruction;Gait training;Stair training;Functional mobility training;Therapeutic activities;Therapeutic exercise;Balance training;Patient/family education    PT Goals (Current goals can be found in the Care Plan section)  Acute Rehab PT Goals Patient Stated Goal: return home with his family PT Goal Formulation: With patient Time For Goal Achievement: 03/22/24 Potential to Achieve Goals: Good    Frequency Min 3X/week     Co-evaluation PT/OT/SLP Co-Evaluation/Treatment: Yes Reason for Co-Treatment: To address functional/ADL transfers PT goals addressed during session: Mobility/safety with mobility;Balance;Proper use of DME OT goals addressed during session: ADL's and self-care       AM-PAC PT 6 Clicks Mobility  Outcome Measure Help needed turning from your back to your side while in a flat bed without using bedrails?: A Little Help needed moving from lying on your back to sitting on the side of a flat bed without using bedrails?: A Little Help needed moving to and from a bed to a chair (including a wheelchair)?: A Little Help needed standing up from a chair using your arms (e.g., wheelchair or bedside chair)?: A Little Help needed to walk in hospital room?: A Little Help needed climbing 3-5 steps with a railing? : A Lot 6 Click Score: 17  End of Session Equipment Utilized During Treatment: Gait belt Activity Tolerance: Patient tolerated treatment well Patient left: in bed;with call bell/phone within reach;with bed alarm set;with family/visitor present Nurse Communication: Mobility status;Other (comment) (position) PT Visit Diagnosis: Unsteadiness on feet (R26.81);Muscle weakness (generalized) (M62.81);Difficulty in walking, not elsewhere classified (R26.2)    Time: 9077-9046 PT Time Calculation (min) (ACUTE ONLY): 31 min   Charges:   PT Evaluation $PT Eval Moderate Complexity: 1 Mod PT  Treatments $Therapeutic Activity: 8-22 mins PT General Charges $$ ACUTE PT VISIT: 1 Visit        Lacinda Fass, PT, DPT  03/15/2024, 11:49 AM

## 2024-03-15 NOTE — Progress Notes (Signed)
 Interventional Radiology Brief Note:  Patient with concern for discitis/osteomyelitis of the lumbar spine.  IR consulted for disc aspiration, however also with positive blood cultures and previously treated empirically.  Disc aspiration deferred at this time.  Dr. Overton aware IR remains available if needed in the future.   Whitman Meinhardt, MS RD PA-C

## 2024-03-15 NOTE — Progress Notes (Signed)
 PROGRESS NOTE    Patient: [name] [name]                            PCP: Bertell Satterfield, MD                    DOB: [birth year]            DOA: 03/14/2024 FMW:[id number]             DOS: 03/15/2024, 12:10 PM   LOS: 0 days   Date of Service: The patient was seen and examined on 03/15/2024  Subjective:   The patient was seen and examined this morning. Lethargic but awake alert, following commands Heart rate 111, blood pressure 107/67, satting 93% on 2 L of oxygen Afebrile with a temp of 98.7   Brief Narrative:   [name] [name] is a 48 y.o. male with medical history significant of anxiety disorder, tobacco abuse, chronic back pain who presents to the emergency department from home due to generalized weakness and poor oral intake. Patient complained of hurting his back about 5 weeks ago and he has been laying in bed most of the time for about a month.  He believed that he aggravated the back from sleeping in an uncomfortable position.  He denied any falls. For about a week now, wife at bedside reports poor oral intake but has been tolerating liquids without any difficulty.  He denies dysphagia.  But having productive cough of rusty colored sputum and subjective fever within the last few days. Patient denies any IV drug use in the last 6 months, alcohol use in past few months and cigarettes use in the past few days.   ED Course:  Patient was tachypneic, tachycardic, but other vital signs are within normal range.  Workup in the ED showed normocytic anemia, thrombocytopenia.  BMP shows sodium 133, potassium 4.8, chloride 98, bicarb 21, blood glucose 129, BUN 46, creatinine 1.45 (last lab for comparison was creatinine of 0.88 and this was 7 years ago), albumin 2.7, AST 48, ALT 23, ALP 362, total bilirubin 1.6, lactic acid 2.5 > 1.7.  TSH 1.730, ammonia 28.  Blood culture pending CT abdomen pelvis showed small cavitary lung base nodules, most likely due to multifocal septic lung emboli, most of  them continue fluid compatible with these being abscesses.  Small pericardial effusion. Chest x-ray showed patchy nodular airspace opacities in both lungs, concerning for infection Patient was treated with IV ceftriaxone  and azithromycin  were given for pneumonia, cefepime and vancomycin  were given for osteomyelitis of the spine. morphine  was given, and Zofran .        Assessment & Plan:   Principal Problem:   Sepsis due to pneumonia [facility]) Active Problems:   Septic embolism (HCC)   Pressure injury of skin   Lactic acidosis   Osteomyelitis of lumbar spine (HCC)   Thrombocytopenia   Transaminasemia   Hypoalbuminemia   Failure to thrive in adult   Sepsis due to pneumonia/ small Septic embolism /possible bacteremia Still tachycardic with heart rate of 111, blood pressure soft but stable 107/67 Otherwise hemodynamically stable Pulmonary blood cultures reporting gram-positive cocci  POA: Patient met sepsis criteria due to being tachycardic, tachypneic -   Chest x-ray was suggestive of infection and CT abdomen and pelvis was suggestive of septic embolism  -  IV hydration per sepsis protocol was provided -Was started on IV ceftriaxone  and azithromycin  in ED. Will Broaden antibiotics to  Cefepime and Vancomycin   Preliminary bronc cultures gram-positive cocci >>  Will follow-up with sputum culture, urine Legionella, strep pneumo and procalcitonin 3.61 -Lactic acid 2.5>>> 1.7 Continue Tylenol  as needed Continue Mucinex , incentive spirometry, flutter valve    Lactic acidosis secondary to above -resolved Lactic acid 2.5 > 1.7   Osteomyelitis of the lumbar spine CT abdomen and pelvis was suggestive of discitis/osteomyelitis of posttraumatic change Patient was empirically started on IV vancomycin  and Cefepime,  -- Consulted ID -appreciate input from Dr.Vu   -Cultures growing gram-positive cocci>> continue Vanco + cefepime  Continue IV Dilaudid  0.5 mg every 3 hours as  needed Continue fall precaution Continue PT/OT eval and treat  MRI of lumbar spine: There is discitis/osteomyelitis at L3-L4 and L5-S1 with a septic right facet joint at L5-S1. There is paraspinal inflammation with small bilateral psoas abscesses.   The bone marrow signal is diffusely abnormal, possibly due to anemia.   Consulting ID/and neurosurgery-appreciate input1   Thrombocytopenia/  Transaminasemia POA: Platelets 104.  AST 48, ALT 23, ALP 362    Latest Ref Rng & Units 03/15/2024    4:11 AM 03/14/2024    7:49 PM 11/08/2016    4:55 AM  Hepatic Function  Total Protein 6.5 - 8.1 g/dL 6.3  7.7  6.6   Albumin 3.5 - 5.0 g/dL 2.3  2.7  3.0   AST 15 - 41 U/L 37  48  34   ALT 0 - 44 U/L 20  23  22    Alk Phosphatase 38 - 126 U/L 271  362  41   Total Bilirubin 0.0 - 1.2 mg/dL 1.0  1.6  0.9    CT abdomen and pelvis showed mild hepatic steatosis with portal spinal megaly and increased prominence of the hepatic portal vein and splenic vein    Hypoalbuminemia possibly due to moderate protein calorie malnutrition   Failure to thrive in adult Albumin 2.7, protein supplement will be provided Continue full liquid diet with plan to advance diet as tolerated Dietitian will be consulted and we shall await further recommendations   Pressure ulcer to sacrum-stage II Wound nurse will be consulted     ----------------------------------------------------------------------------------------------------------------------------- Nutritional status: With failure to thrive/hypoalbuminemia/ The patient's BMI is: Body mass index is 18.69 kg/m. I agree with the assessment and plan as outlined   Skin Assessment: I have examined the patient's skin and I agree with the wound assessment as performed by wound care team As outlined belowe: Wound 03/15/24 0130 Pressure Injury Sacrum Medial Deep Tissue Pressure Injury - Purple or maroon localized area of discolored intact skin or blood-filled blister due  to damage of underlying soft tissue from pressure and/or shear. (Active)     ---------------------------------------------------------------------------------------------------------------------------------------------------- Cultures; Blood Cultures x 2 >> gram-positive cocci   Antibiotics: Azithromycin /vancomycin /cefepime IV  ------------------------------------------------------------------------------------------------------------------------------------------------  DVT prophylaxis:  SCDs Start: 03/15/24 0330   Code Status:   Code Status: Full Code  Family Communication: No family member present at bedside-  -Advance care planning has been discussed.   Admission status:   Status is: Inpatient Needing IV fluids, IV antibiotics   Disposition: From  - home             Planning for discharge in 1-2 days   Procedures:   No admission procedures for hospital encounter.   Antimicrobials:  Anti-infectives (From admission, onward)    Start     Dose/Rate Route Frequency Ordered Stop   03/15/24 2200  azithromycin  (ZITHROMAX ) 500 mg in sodium chloride  0.9 % 250 mL  IVPB  Status:  Discontinued        500 mg 250 mL/hr over 60 Minutes Intravenous Every 24 hours 03/15/24 0332 03/15/24 1030   03/15/24 1000  ceFEPIme (MAXIPIME) 2 g in sodium chloride  0.9 % 100 mL IVPB  Status:  Discontinued        2 g 200 mL/hr over 30 Minutes Intravenous Every 12 hours 03/15/24 0347 03/15/24 1210   03/15/24 1000  vancomycin  (VANCOREADY) IVPB 750 mg/150 mL        750 mg 150 mL/hr over 60 Minutes Intravenous Every 12 hours 03/15/24 0353     03/15/24 0000  vancomycin  (VANCOREADY) IVPB 1500 mg/300 mL        1,500 mg 150 mL/hr over 120 Minutes Intravenous  Once 03/14/24 2349 03/15/24 0826   03/15/24 0000  ceFEPIme (MAXIPIME) 2 g in sodium chloride  0.9 % 100 mL IVPB        2 g 200 mL/hr over 30 Minutes Intravenous  Once 03/14/24 2349 03/15/24 0101   03/14/24 2115  cefTRIAXone  (ROCEPHIN ) 2 g in sodium  chloride 0.9 % 100 mL IVPB        2 g 200 mL/hr over 30 Minutes Intravenous Once 03/14/24 2108 03/14/24 2221   03/14/24 2115  azithromycin  (ZITHROMAX ) 500 mg in sodium chloride  0.9 % 250 mL IVPB        500 mg 250 mL/hr over 60 Minutes Intravenous  Once 03/14/24 2108 03/14/24 2249        Medication:   dextromethorphan-guaiFENesin   1 tablet Oral BID   feeding supplement  237 mL Oral BID BM    acetaminophen  **OR** acetaminophen , HYDROmorphone  (DILAUDID ) injection, ondansetron  **OR** ondansetron  (ZOFRAN ) IV, mouth rinse   Objective:   Vitals:   03/15/24 0150 03/15/24 0400 03/15/24 1029 03/15/24 1149  BP: 125/85 117/80 100/66 107/67  Pulse: (!) 111 (!) 109 (!) 123 (!) 111  Resp:  18  18  Temp: 98.3 F (36.8 C) 100.2 F (37.9 C) 98.7 F (37.1 C) 98.7 F (37.1 C)  TempSrc: Oral  Oral Oral  SpO2: 90% 97% 96% 93%  Weight: 62.5 kg     Height:        Intake/Output Summary (Last 24 hours) at 03/15/2024 1210 Last data filed at 03/15/2024 1154 Gross per 24 hour  Intake 3465.89 ml  Output 350 ml  Net 3115.89 ml   Filed Weights   03/15/24 0150  Weight: 62.5 kg     Physical examination:   General:  AAO x 3,  cooperative, no distress;   HEENT:  Normocephalic, PERRL, otherwise with in Normal limits   Neuro:  CNII-XII intact. , normal motor and sensation, reflexes intact   Lungs:   Clear to auscultation BL, Respirations unlabored,  No wheezes / crackles  Cardio:    S1/S2, RRR, No murmure, No Rubs or Gallops   Abdomen:  Soft, non-tender, bowel sounds active all four quadrants, no guarding or peritoneal signs.  Muscular  skeletal:  Limited exam -global generalized weaknesses - in bed, able to move all 4 extremities,   2+ pulses,  symmetric, No pitting edema  Skin:  Dry, warm to touch, negative for any Rashes,  Wounds: Please see nursing documentation  Wound 03/15/24 0130 Pressure Injury Sacrum Medial Deep Tissue Pressure Injury - Purple or maroon localized area of  discolored intact skin or blood-filled blister due to damage of underlying soft tissue from pressure and/or shear. (Active)       ------------------------------------------------------------------------------------------------------------------------------------------    LABs:  Latest Ref Rng & Units 03/15/2024    4:11 AM 03/14/2024    7:49 PM 11/09/2016    4:21 AM  CBC  WBC 4.0 - 10.5 K/uL 6.6  9.6  4.3   Hemoglobin 13.0 - 17.0 g/dL 8.6  89.4  88.0   Hematocrit 39.0 - 52.0 % 27.4  32.4  36.7   Platelets 150 - 400 K/uL 93  104  154       Latest Ref Rng & Units 03/15/2024    4:11 AM 03/14/2024    7:49 PM 11/09/2016    4:21 AM  CMP  Glucose 70 - 99 mg/dL 890  870  92   BUN 6 - 20 mg/dL 40  46  25   Creatinine 0.61 - 1.24 mg/dL 8.79  8.54  9.11   Sodium 135 - 145 mmol/L 136  133  140   Potassium 3.5 - 5.1 mmol/L 4.5  4.8  4.0   Chloride 98 - 111 mmol/L 104  98  105   CO2 22 - 32 mmol/L 22  21  29    Calcium 8.9 - 10.3 mg/dL 8.1  8.6  9.0   Total Protein 6.5 - 8.1 g/dL 6.3  7.7    Total Bilirubin 0.0 - 1.2 mg/dL 1.0  1.6    Alkaline Phos 38 - 126 U/L 271  362    AST 15 - 41 U/L 37  48    ALT 0 - 44 U/L 20  23         Micro Results Recent Results (from the past 240 hours)  Culture, blood (Routine x 2)     Status: None (Preliminary result)   Collection Time: 03/14/24  7:34 PM   Specimen: Left Antecubital; Blood  Result Value Ref Range Status   Specimen Description LEFT ANTECUBITAL  Final   Special Requests   Final    BOTTLES DRAWN AEROBIC ONLY Blood Culture results may not be optimal due to an inadequate volume of blood received in culture bottles   Culture  Setup Time   Final    GRAM POSITIVE COCCI AEROBIC BOTTLE ONLY Gram Stain Report Called to,Read Back By and Verified With: JACKSON @ 1141 ON [id number] BY HENDERSON L Performed at [facility], [address], [place], [place] [address]    Culture [facility] POSITIVE COCCI  Final   Report Status PENDING  Incomplete  Culture,  blood (Routine x 2)     Status: None (Preliminary result)   Collection Time: 03/14/24 11:21 PM   Specimen: BLOOD  Result Value Ref Range Status   Specimen Description BLOOD BLOOD RIGHT ARM  Final   Special Requests AEROBIC BOTTLE ONLY Blood Culture adequate volume  Final   Culture   Final    NO GROWTH < 12 HOURS Performed at [facility], [address] [place], [place] [address]    Report Status PENDING  Incomplete    Radiology Reports MR Lumbar Spine W Wo Contrast Result Date: 03/15/2024 CLINICAL DATA:  Back pain and weakness EXAM: MRI LUMBAR SPINE WITHOUT AND WITH CONTRAST TECHNIQUE: Multiplanar and multiecho pulse sequences of the lumbar spine were obtained without and with intravenous contrast. CONTRAST:  6mL GADAVIST GADOBUTROL 1 MMOL/ML IV SOLN COMPARISON:  January 16, 2014 FINDINGS: The bone marrow signal is diffusely abnormal with lower than normal signal on the T1 weighted image. There is loss of disc height at L3-L4 with mild edema and enhancement in the vertebral endplates on the left with paraspinal inflammation. There is  edema and enhancement of the right-side of the vertebral endplates at L5-S1 with paraspinal inflammation. The right facet joint at L5-S1 is inflamed with enhancement in the adjacent soft tissues and a small abscess posterior to the facet joint. There are bilateral small psoas abscesses. There is no epidural abscess. There is no spinal stenosis. There is pseudoarthrosis/partial fusion of the right transverse process of L5 and the right-side of the sacrum with a chronic signal abnormality in the bone in this location. IMPRESSION: There is discitis/osteomyelitis at L3-L4 and L5-S1 with a septic right facet joint at L5-S1. There is paraspinal inflammation with small bilateral psoas abscesses. The bone marrow signal is diffusely abnormal, possibly due to anemia. A diffuse infiltrative process is possible but considered less likely. Electronically Signed   By: Nancyann Burns M.D.    On: 03/15/2024 11:39   CT ABDOMEN PELVIS W CONTRAST Result Date: 03/14/2024 CLINICAL DATA:  Suspected sepsis. Low energy x5 weeks worsening in the last week. Infiltrates on chest x-ray. Abdominal pain, acute, nonlocalized with known herniated lumbar disc with recent re-injury per family. EXAM: CT ABDOMEN AND PELVIS WITH CONTRAST CT LUMBAR SPINE WITHOUT CONTRAST TECHNIQUE: Multidetector CT imaging of the abdomen and pelvis was performed using the standard protocol following bolus administration of intravenous contrast. Multidetector CT imaging of the lumbar spine was performed without the use of contrast. Multiplanar CT image reconstructions were also generated and reviewed. RADIATION DOSE REDUCTION: This exam was performed according to the departmental dose-optimization program which includes automated exposure control, adjustment of the mA and/or kV according to patient size and/or use of iterative reconstruction technique. CONTRAST:  OMNIPAQUE  IOHEXOL  300 MG/ML  SOLN COMPARISON:  CT abdomen and pelvis with contrast 02/02/2015. FINDINGS: CT ABDOMEN AND PELVIS WITH CONTRAST FINDINGS Lower chest: There are numerous diffusely scattered small cavitary lesions both lung bases, seen as nodular infiltrates on chest x-ray. Findings are most likely due to multifocal septic lung emboli, and most of them contain fluid compatible with these being abscesses. The largest of these is in the anteromedial right middle lobe measuring 3.2 cm on the first image. The largest in the right lower lobe is in the anterior basal segment measuring 2.7 cm on 6:8. These are less numerous in the left lung base. The largest left lung base cavity is in the lingular base measuring 2.7 cm on 6:8 and there is patchy airspace disease associated with smaller cavitary foci in the posterior basal left lower lobe. There is a small pericardial effusion. The pericardial fluid is low in density. The cardiac size is normal. Hepatobiliary: The liver  is mildly steatotic measuring 21 cm length. There is no mass enhancement or abscess. Gallbladder and bile ducts are unremarkable. Pancreas: No abnormality Spleen: No mass. Splenomegaly. The spleen measures 12.3 x 4.1 x 16 cm. Adrenals/Urinary Tract: there is no adrenal or renal mass enhancement, no urinary stone or obstruction. There is a Bosniak 1 cyst in the superior pole of the right kidney measuring 1.2 cm, 13 Hounsfield units. Smaller Bosniak 2 cortical cyst in the mid pole of this kidney is too small characterize. No follow-up imaging is recommended. Both kidneys are otherwise unremarkable and secrete symmetrically on the delayed images. The bladder is mildly thickened which could be due to underdistention, hypertrophy or cystitis. Stomach/Bowel: No dilatation. No overt wall thickening. Limited assessment without enteric contrast. Normal caliber appendix. Scattered sigmoid diverticulosis. No evidence of acute diverticulitis. Vascular/Lymphatic: Patchy aortoiliac calcific plaque, age advanced, increased from 9 years ago. Clinical correlation advised.  There is a prominent hepatic portal vein measuring 16 mm and increasingly engorged splenic vein. The SMV and tributaries are normal caliber. No adenopathy is seen. Reproductive: No prostatomegaly.  Pelvic phleboliths. Other: Generalized mesenteric congestive changes. No focal inflammatory process is seen through the generalized edema. There is a small volume of pelvic ascites. There is no free hemorrhage, free air or incarcerated hernia. There is mild body wall anasarca. Musculoskeletal: Mild dextroscoliosis, degenerative changes of the lumbar spine, with additional lumbar findings which will be described below. CT LUMBAR SPINE WITHOUT CONTRAST FINDINGS Segmentation: There are 5 lumbar type segments, but L5 is transitional demonstrating right hemisacralization. Alignment: There is mild lumbar dextroscoliosis apex at L1-2. There is a trace, likely discogenic  retrolisthesis at L2-3 where there is disc space loss. No other listhesis or further malalignment is seen. Vertebrae: At L2-3 there are small anterior and posterior peridiscal endplate lucencies with slight fragmentation. At L4-5, there is peridiscal endplate fragmentation and subcortical lucency to the right. Both findings could have an infectious or posttraumatic basis. The patient does relate a history of recent back injury. The findings at L2-3 are less clearly posttraumatic given the location anteriorly and posteriorly. At L1, there is nondisplaced bony fragmentation of the anterior superior vertebral body without height loss. All vertebrae are grossly normal in heights. This latter finding is almost certainly due to trauma. Other vertebrae are grossly intact. There is mild-to-moderate thoracolumbar spondylosis. Paraspinal soft tissues: There is paraspinal soft tissue thickening at L2-3. This could be due to paraspinal phlegmon or posttraumatic reactive soft tissue change. At L4-5 there is similar paraspinal soft tissue thickening on the right likely on the same etiology. There is no psoas abscess or localizing paraspinal fluid collection. Disc levels: T11-12 through L1-2: Inferior osteophytes. Normal disc heights. No herniations or stenoses. L2-3: Moderate disc space loss greater on the left. Left paracentral to left far lateral disc bulge extends into the left foramen with moderate to severe left foraminal stenosis. There is mild right foraminal stenosis. There is moderate effacement of the left subarticular zone but no significant spinal stenosis, no herniation is seen. L3-4: Normal disc height. Mild diffuse annular bulge. No herniation, canal zone or foraminal zone stenosis. L4-5: Mild disc space loss. Mild diffuse disc bulge without herniation or stenosis except for mild subarticular zone encroachment. The foramina appear clear. L5-S1: No abnormality. Other: Both SI joints are patent with slight spurring.  No SI joint erosions. IMPRESSION: 1. Numerous small cavitary lung base nodules, most likely due to multifocal septic lung emboli, most of them containing fluid compatible with these being abscesses. 2. Small pericardial effusion. 3. Generalized mesenteric congestive changes, small volume of pelvic ascites, and mild body wall anasarca. 4. Mild hepatic steatosis with hepatosplenomegaly and increased prominence of the hepatic portal vein and splenic vein. 5. Cystitis versus bladder hypertrophy or underdistention. 6. Age advanced aortoiliac atherosclerosis. 7. L2-3 disc space loss with small anterior and posterior peridiscal endplate lucencies with slight fragmentation. There is paraspinal soft tissue thickening at this level which could be due to paraspinal phlegmon or posttraumatic reactive soft tissue change. 8. L4-5 peridiscal endplate fragmentation and subcortical lucency to the right, with paraspinal soft tissue thickening on the right. Findings could be due to discitis/osteomyelitis or posttraumatic change. 9. Nondisplaced bony fragmentation of the anterior superior L1 vertebral body without height loss. This is almost certainly due to trauma. 10. L2-3 left paracentral to left far lateral disc bulge with moderate to severe left foraminal stenosis and moderate effacement  of the left subarticular zone. 11. L4-5 mild subarticular zone encroachment. 12. Lumbar MRI without and with contrast may be helpful to further characterize these findings. Aortic Atherosclerosis (ICD10-I70.0). Electronically Signed   By: Francis Quam M.D.   On: 03/14/2024 22:24   CT L-SPINE NO CHARGE Result Date: 03/14/2024 CLINICAL DATA:  Suspected sepsis. Low energy x5 weeks worsening in the last week. Infiltrates on chest x-ray. Abdominal pain, acute, nonlocalized with known herniated lumbar disc with recent re-injury per family. EXAM: CT ABDOMEN AND PELVIS WITH CONTRAST CT LUMBAR SPINE WITHOUT CONTRAST TECHNIQUE: Multidetector CT imaging  of the abdomen and pelvis was performed using the standard protocol following bolus administration of intravenous contrast. Multidetector CT imaging of the lumbar spine was performed without the use of contrast. Multiplanar CT image reconstructions were also generated and reviewed. RADIATION DOSE REDUCTION: This exam was performed according to the departmental dose-optimization program which includes automated exposure control, adjustment of the mA and/or kV according to patient size and/or use of iterative reconstruction technique. CONTRAST:  OMNIPAQUE  IOHEXOL  300 MG/ML  SOLN COMPARISON:  CT abdomen and pelvis with contrast 02/02/2015. FINDINGS: CT ABDOMEN AND PELVIS WITH CONTRAST FINDINGS Lower chest: There are numerous diffusely scattered small cavitary lesions both lung bases, seen as nodular infiltrates on chest x-ray. Findings are most likely due to multifocal septic lung emboli, and most of them contain fluid compatible with these being abscesses. The largest of these is in the anteromedial right middle lobe measuring 3.2 cm on the first image. The largest in the right lower lobe is in the anterior basal segment measuring 2.7 cm on 6:8. These are less numerous in the left lung base. The largest left lung base cavity is in the lingular base measuring 2.7 cm on 6:8 and there is patchy airspace disease associated with smaller cavitary foci in the posterior basal left lower lobe. There is a small pericardial effusion. The pericardial fluid is low in density. The cardiac size is normal. Hepatobiliary: The liver is mildly steatotic measuring 21 cm length. There is no mass enhancement or abscess. Gallbladder and bile ducts are unremarkable. Pancreas: No abnormality Spleen: No mass. Splenomegaly. The spleen measures 12.3 x 4.1 x 16 cm. Adrenals/Urinary Tract: there is no adrenal or renal mass enhancement, no urinary stone or obstruction. There is a Bosniak 1 cyst in the superior pole of the right kidney  measuring 1.2 cm, 13 Hounsfield units. Smaller Bosniak 2 cortical cyst in the mid pole of this kidney is too small characterize. No follow-up imaging is recommended. Both kidneys are otherwise unremarkable and secrete symmetrically on the delayed images. The bladder is mildly thickened which could be due to underdistention, hypertrophy or cystitis. Stomach/Bowel: No dilatation. No overt wall thickening. Limited assessment without enteric contrast. Normal caliber appendix. Scattered sigmoid diverticulosis. No evidence of acute diverticulitis. Vascular/Lymphatic: Patchy aortoiliac calcific plaque, age advanced, increased from 9 years ago. Clinical correlation advised. There is a prominent hepatic portal vein measuring 16 mm and increasingly engorged splenic vein. The SMV and tributaries are normal caliber. No adenopathy is seen. Reproductive: No prostatomegaly.  Pelvic phleboliths. Other: Generalized mesenteric congestive changes. No focal inflammatory process is seen through the generalized edema. There is a small volume of pelvic ascites. There is no free hemorrhage, free air or incarcerated hernia. There is mild body wall anasarca. Musculoskeletal: Mild dextroscoliosis, degenerative changes of the lumbar spine, with additional lumbar findings which will be described below. CT LUMBAR SPINE WITHOUT CONTRAST FINDINGS Segmentation: There are 5 lumbar type  segments, but L5 is transitional demonstrating right hemisacralization. Alignment: There is mild lumbar dextroscoliosis apex at L1-2. There is a trace, likely discogenic retrolisthesis at L2-3 where there is disc space loss. No other listhesis or further malalignment is seen. Vertebrae: At L2-3 there are small anterior and posterior peridiscal endplate lucencies with slight fragmentation. At L4-5, there is peridiscal endplate fragmentation and subcortical lucency to the right. Both findings could have an infectious or posttraumatic basis. The patient does relate a  history of recent back injury. The findings at L2-3 are less clearly posttraumatic given the location anteriorly and posteriorly. At L1, there is nondisplaced bony fragmentation of the anterior superior vertebral body without height loss. All vertebrae are grossly normal in heights. This latter finding is almost certainly due to trauma. Other vertebrae are grossly intact. There is mild-to-moderate thoracolumbar spondylosis. Paraspinal soft tissues: There is paraspinal soft tissue thickening at L2-3. This could be due to paraspinal phlegmon or posttraumatic reactive soft tissue change. At L4-5 there is similar paraspinal soft tissue thickening on the right likely on the same etiology. There is no psoas abscess or localizing paraspinal fluid collection. Disc levels: T11-12 through L1-2: Inferior osteophytes. Normal disc heights. No herniations or stenoses. L2-3: Moderate disc space loss greater on the left. Left paracentral to left far lateral disc bulge extends into the left foramen with moderate to severe left foraminal stenosis. There is mild right foraminal stenosis. There is moderate effacement of the left subarticular zone but no significant spinal stenosis, no herniation is seen. L3-4: Normal disc height. Mild diffuse annular bulge. No herniation, canal zone or foraminal zone stenosis. L4-5: Mild disc space loss. Mild diffuse disc bulge without herniation or stenosis except for mild subarticular zone encroachment. The foramina appear clear. L5-S1: No abnormality. Other: Both SI joints are patent with slight spurring. No SI joint erosions. IMPRESSION: 1. Numerous small cavitary lung base nodules, most likely due to multifocal septic lung emboli, most of them containing fluid compatible with these being abscesses. 2. Small pericardial effusion. 3. Generalized mesenteric congestive changes, small volume of pelvic ascites, and mild body wall anasarca. 4. Mild hepatic steatosis with hepatosplenomegaly and increased  prominence of the hepatic portal vein and splenic vein. 5. Cystitis versus bladder hypertrophy or underdistention. 6. Age advanced aortoiliac atherosclerosis. 7. L2-3 disc space loss with small anterior and posterior peridiscal endplate lucencies with slight fragmentation. There is paraspinal soft tissue thickening at this level which could be due to paraspinal phlegmon or posttraumatic reactive soft tissue change. 8. L4-5 peridiscal endplate fragmentation and subcortical lucency to the right, with paraspinal soft tissue thickening on the right. Findings could be due to discitis/osteomyelitis or posttraumatic change. 9. Nondisplaced bony fragmentation of the anterior superior L1 vertebral body without height loss. This is almost certainly due to trauma. 10. L2-3 left paracentral to left far lateral disc bulge with moderate to severe left foraminal stenosis and moderate effacement of the left subarticular zone. 11. L4-5 mild subarticular zone encroachment. 12. Lumbar MRI without and with contrast may be helpful to further characterize these findings. Aortic Atherosclerosis (ICD10-I70.0). Electronically Signed   By: Francis Quam M.D.   On: 03/14/2024 22:24   DG Chest Port 1 View if patient is in a treatment room. Result Date: 03/14/2024 EXAM: 1 VIEW(S) XRAY OF THE CHEST 03/14/2024 08:26:20 PM COMPARISON: None available. CLINICAL HISTORY: Suspected Sepsis. Table formatting from the original note was not included.; Per triage: ; Pt c/o being sick for a while. Wife states re injured his  herniated disk so he started just lying around for about 5 weeks but worse the last week and not eating. Losing week. Pt appears slightly jaundiced. Smells of urine. A/o but slow to respond. ; coughing FINDINGS: LUNGS AND PLEURA: Patchy nodular airspace opacities in both lungs. No pulmonary edema. No pleural effusion. No pneumothorax. HEART AND MEDIASTINUM: No acute abnormality of the cardiac and mediastinal silhouettes. BONES AND  SOFT TISSUES: No acute osseous abnormality. IMPRESSION: 1. Patchy nodular airspace opacities in both lungs, concerning for infection. Electronically signed by: Ozell Daring MD 03/14/2024 08:32 PM EDT RP Workstation: [id number]    SIGNED: Adriana DELENA Grams, MD, FHM. FAAFP. Jolynn Pack - [facility] Time spent - 55 min.  In seeing, evaluating and examining the patient. Reviewing medical records, labs, drawn plan of care. [facility],  Pager (please use [email or web address] to page/ text) Please use Epic Secure Chat for non-urgent communication (7AM-7PM)  If 7PM-7AM, please contact night-coverage [email or web address], 03/15/2024, 12:10 PM

## 2024-03-15 NOTE — Progress Notes (Signed)
*  PRELIMINARY RESULTS* Echocardiogram 2D Echocardiogram has been performed.  [name] [name] 03/15/2024, 4:53 PM

## 2024-03-15 NOTE — Progress Notes (Addendum)
 Microbiology from [facility] called, [facility] came back with Staff Aureus, 1 of 2 bottles, no resistance mechanisms detected. NP Erminio Cone made aware.

## 2024-03-15 NOTE — Progress Notes (Unsigned)
 Patient in MRI per secretary 300.       Aida Pizza, RCS

## 2024-03-15 NOTE — Consult Note (Signed)
 WOC Nurse Consult Note: Reason for Consult: sacral wound  Wound type: Deep Tissue Pressure Injury purple maroon discoloration that is evolving with open red areas some dark hemorrhagic tissue  Pressure Injury POA: Yes Measurement: see nursing flowsheet  Wound bed: as above  Drainage (amount, consistency, odor) see nursing flowsheet  Periwound: erythema  Dressing procedure/placement/frequency:  Cleanse sacral wound with NS, apply Xeroform Soila #[id number]) gauze to wound bed daily and secure with silicone foam or ABD pad whichever is preferred.   POC discussed with bedside nurse. WOC team will not follow. Re-consult if further needs arise.   Thank you,    Powell Bar MSN, RN-BC, [employer]

## 2024-03-15 NOTE — Progress Notes (Signed)
 Pharmacy Antibiotic Note  [name] [name] is a 48 y.o. male admitted on 03/14/2024 with osteomyelitis, septic emboli/abscesses in the lung.  Pharmacy has been consulted for Vancomycin  dosing. WBC WNL. Mild bump in Scr.   Plan: Vancomycin  1500 mg IV x 1, then 750 mg IV q12h >>>Estimated AUC: 539 Cefepime per MD Trend WBC, temp, renal function  F/U infectious work-up Drug levels as indicated   Height: 6' (182.9 cm) Weight: 62.5 kg (137 lb 12.6 oz) IBW/kg (Calculated) : 77.6  Temp (24hrs), Avg:98.7 F (37.1 C), Min:98.3 F (36.8 C), Max:98.9 F (37.2 C)  Recent Labs  Lab 03/14/24 1949 03/14/24 2321  WBC 9.6  --   CREATININE 1.45*  --   LATICACIDVEN 2.5* 1.7    Estimated Creatinine Clearance: 56.3 mL/min (A) (by C-G formula based on SCr of 1.45 mg/dL (H)).    Allergies  Allergen Reactions   Penicillins Other (See Comments)    Unknown reaction    Lynwood Mckusick, PharmD, BCPS Clinical Pharmacist Phone: [phone]

## 2024-03-15 NOTE — Plan of Care (Signed)
  Problem: Acute Rehab PT Goals(only PT should resolve) Goal: Pt Will Go Supine/Side To Sit Outcome: Progressing Flowsheets (Taken 03/15/2024 1154) Pt will go Supine/Side to Sit: with supervision Goal: Patient Will Transfer Sit To/From Stand Outcome: Progressing Flowsheets (Taken 03/15/2024 1154) Patient will transfer sit to/from stand: with supervision Goal: Pt Will Transfer Bed To Chair/Chair To Bed Outcome: Progressing Flowsheets (Taken 03/15/2024 1154) Pt will Transfer Bed to Chair/Chair to Bed: with supervision Goal: Pt Will Ambulate Outcome: Progressing Flowsheets (Taken 03/15/2024 1154) Pt will Ambulate:  50 feet  with supervision  with rolling walker Goal: Pt/caregiver will Perform Home Exercise Program Outcome: Progressing Flowsheets (Taken 03/15/2024 1154) Pt/caregiver will Perform Home Exercise Program:  For increased strengthening  For improved balance  Independently   Lacinda Fass, PT, DPT

## 2024-03-15 NOTE — Progress Notes (Signed)
 Pt w/ PMH of poly substance abuse presented to [facility] ED w/ c/o generalized fatigue and increasing LBP for >26month. Pt found to be septic likely secondary to PNA. Neurologically intact per primary MD. Lumbar MRI w/ and w/o reviewed indicating L3-4, L5-S1 osteomyelitis/discitis with enhancement of the R L5-S1 facet joint. Small b/l psoas abscess. No significant stenosis. Recommend conservative management per primary teams. No urgent neurosurgical intervention indicated.   Brena Windsor CAYLIN Crimson Dubberly, PA-C

## 2024-03-15 NOTE — Plan of Care (Signed)
  Problem: Acute Rehab OT Goals (only OT should resolve) Goal: Pt. Will Perform Grooming Flowsheets (Taken 03/15/2024 1050) Pt Will Perform Grooming:  with modified independence  standing Goal: Pt. Will Transfer To Toilet Flowsheets (Taken 03/15/2024 1050) Pt Will Transfer to Toilet:  with modified independence  ambulating Goal: Pt/Caregiver Will Perform Home Exercise Program Flowsheets (Taken 03/15/2024 1050) Pt/caregiver will Perform Home Exercise Program:  Increased strength  Independently  Right Upper extremity  Cassidee Deats OT, MOT

## 2024-03-15 NOTE — H&P (Addendum)
 History and Physical    Patient: [name] [name] FMW:[id number] DOB: Jan 22, 1976 DOA: 03/14/2024 DOS: the patient was seen and examined on 03/15/2024 PCP: Bertell Satterfield, MD  Patient coming from: Home  Chief Complaint: No chief complaint on file.  HPI: [name] [name] is a 48 y.o. male with medical history significant of anxiety disorder, tobacco abuse, chronic back pain who presents to the emergency department from home due to generalized weakness and poor oral intake. Patient complained of hurting his back about 5 weeks ago and he has been laying in bed most of the time for about a month.  He believed that he aggravated the back from sleeping in an uncomfortable position.  He denied any falls. About a week ago, wife at bedside reports poor oral intake but has been tolerating liquids without any difficulty.  He denies dysphagia.  Patient has also been having productive cough of rusty colored sputum and subjective fever within the last few days. Patient denies any IV drug use in the last 6 months, alcohol use in past few months and cigarettes use in the past few days.  ED Course:  In the emergency department, patient was tachypneic, tachycardic, but other vital signs are within normal range.  Workup in the ED showed normocytic anemia, thrombocytopenia.  BMP shows sodium 133, potassium 4.8, chloride 98, bicarb 21, blood glucose 129, BUN 46, creatinine 1.45 (last lab for comparison was creatinine of 0.88 and this was 7 years ago), albumin 2.7, AST 48, ALT 23, ALP 362, total bilirubin 1.6, lactic acid 2.5 > 1.7.  TSH 1.730, ammonia 28.  Blood culture pending CT abdomen pelvis showed small cavitary lung base nodules, most likely due to multifocal septic lung emboli, most of them continue fluid compatible with these being abscesses.  Small pericardial effusion. Chest x-ray showed patchy nodular airspace opacities in both lungs, concerning for infection Patient was treated with IV ceftriaxone  and  azithromycin  were given for pneumonia, cefepime and vancomycin  were given for osteomyelitis of the spine. morphine  was given, Zofran  was also provided. [facility] was asked to admit patient  Review of Systems: Review of systems as noted in the HPI. All other systems reviewed and are negative.   Past Medical History:  Diagnosis Date   Anxiety    Back pain    Fracture of rib of right side 01/26/2015   Hypertension    Past Surgical History:  Procedure Laterality Date   TEE WITHOUT CARDIOVERSION N/A 01/21/2014   Procedure: TRANSESOPHAGEAL ECHOCARDIOGRAM (TEE) with propofol ;  Surgeon: Dorn JULIANNA Ross, MD;  Location: [facility];  Service: Endoscopy;  Laterality: N/A;    Social History:  reports that he has been smoking cigarettes. He has never used smokeless tobacco. He reports current drug use. Drug: Heroin. He reports that he does not drink alcohol.   Allergies  Allergen Reactions   Penicillins Other (See Comments)    Unknown reaction    History reviewed. No pertinent family history.   Prior to Admission medications   Medication Sig Start Date End Date Taking? Authorizing Provider  ALPRAZolam  (XANAX ) 1 MG tablet Take 1 mg by mouth in the morning, at noon, in the evening, and at bedtime.   Yes [provider]  Oxycodone  HCl 10 MG TABS Take 1 tablet by mouth 3 (three) times daily.   Yes [provider]    Physical Exam: BP 125/85 (BP Location: Right Arm)   Pulse (!) 111   Temp 98.3 F (36.8 C) (Oral)   Resp ROLLEN)  24   Ht 6' (1.829 m)   Wt 62.5 kg   SpO2 90%   BMI 18.69 kg/m   General: 48 y.o. year-old male.  Ill appearing, but in no acute distress.  Alert and oriented x3. HEENT: NCAT, EOMI, dry mucous membrane Neck: Supple, trachea medial Cardiovascular: Tachycardia, regular rate and rhythm with no rubs or gallops.  No thyromegaly or JVD noted.  No lower extremity edema. 2/4 pulses in all 4 extremities. Respiratory: Tachypnea, scattered rhonchi with no wheezes.   Abdomen: Soft, nontender nondistended with normal bowel sounds x4 quadrants. Muskuloskeletal: Tender to palpation in the lumbar area.  No cyanosis, clubbing or edema noted bilaterally Neuro: CN II-XII intact, sensation, reflexes intact Skin: No ulcerative lesions noted or rashes Psychiatry: Mood is appropriate for condition and setting          Labs on Admission:  Basic Metabolic Panel: Recent Labs  Lab 03/14/24 1949  NA 133*  K 4.8  CL 98  CO2 21*  GLUCOSE 129*  BUN 46*  CREATININE 1.45*  CALCIUM 8.6*   Liver Function Tests: Recent Labs  Lab 03/14/24 1949  AST 48*  ALT 23  ALKPHOS 362*  BILITOT 1.6*  PROT 7.7  ALBUMIN 2.7*   No results for input(s): LIPASE, AMYLASE in the last 168 hours. Recent Labs  Lab 03/14/24 1949  AMMONIA 28   CBC: Recent Labs  Lab 03/14/24 1949  WBC 9.6  NEUTROABS 8.7*  HGB 10.5*  HCT 32.4*  MCV 91.8  PLT 104*   Cardiac Enzymes: No results for input(s): CKTOTAL, CKMB, CKMBINDEX, TROPONINI in the last 168 hours.  BNP (last 3 results) No results for input(s): BNP in the last 8760 hours.  ProBNP (last 3 results) No results for input(s): PROBNP in the last 8760 hours.  CBG: Recent Labs  Lab 03/14/24 1846  GLUCAP 113*    Radiological Exams on Admission: CT ABDOMEN PELVIS W CONTRAST Result Date: 03/14/2024 CLINICAL DATA:  Suspected sepsis. Low energy x5 weeks worsening in the last week. Infiltrates on chest x-ray. Abdominal pain, acute, nonlocalized with known herniated lumbar disc with recent re-injury per family. EXAM: CT ABDOMEN AND PELVIS WITH CONTRAST CT LUMBAR SPINE WITHOUT CONTRAST TECHNIQUE: Multidetector CT imaging of the abdomen and pelvis was performed using the standard protocol following bolus administration of intravenous contrast. Multidetector CT imaging of the lumbar spine was performed without the use of contrast. Multiplanar CT image reconstructions were also generated and reviewed. RADIATION  DOSE REDUCTION: This exam was performed according to the departmental dose-optimization program which includes automated exposure control, adjustment of the mA and/or kV according to patient size and/or use of iterative reconstruction technique. CONTRAST:  OMNIPAQUE  IOHEXOL  300 MG/ML  SOLN COMPARISON:  CT abdomen and pelvis with contrast 02/02/2015. FINDINGS: CT ABDOMEN AND PELVIS WITH CONTRAST FINDINGS Lower chest: There are numerous diffusely scattered small cavitary lesions both lung bases, seen as nodular infiltrates on chest x-ray. Findings are most likely due to multifocal septic lung emboli, and most of them contain fluid compatible with these being abscesses. The largest of these is in the anteromedial right middle lobe measuring 3.2 cm on the first image. The largest in the right lower lobe is in the anterior basal segment measuring 2.7 cm on 6:8. These are less numerous in the left lung base. The largest left lung base cavity is in the lingular base measuring 2.7 cm on 6:8 and there is patchy airspace disease associated with smaller cavitary foci in the posterior basal left  lower lobe. There is a small pericardial effusion. The pericardial fluid is low in density. The cardiac size is normal. Hepatobiliary: The liver is mildly steatotic measuring 21 cm length. There is no mass enhancement or abscess. Gallbladder and bile ducts are unremarkable. Pancreas: No abnormality Spleen: No mass. Splenomegaly. The spleen measures 12.3 x 4.1 x 16 cm. Adrenals/Urinary Tract: there is no adrenal or renal mass enhancement, no urinary stone or obstruction. There is a Bosniak 1 cyst in the superior pole of the right kidney measuring 1.2 cm, 13 Hounsfield units. Smaller Bosniak 2 cortical cyst in the mid pole of this kidney is too small characterize. No follow-up imaging is recommended. Both kidneys are otherwise unremarkable and secrete symmetrically on the delayed images. The bladder is mildly thickened which could  be due to underdistention, hypertrophy or cystitis. Stomach/Bowel: No dilatation. No overt wall thickening. Limited assessment without enteric contrast. Normal caliber appendix. Scattered sigmoid diverticulosis. No evidence of acute diverticulitis. Vascular/Lymphatic: Patchy aortoiliac calcific plaque, age advanced, increased from 9 years ago. Clinical correlation advised. There is a prominent hepatic portal vein measuring 16 mm and increasingly engorged splenic vein. The SMV and tributaries are normal caliber. No adenopathy is seen. Reproductive: No prostatomegaly.  Pelvic phleboliths. Other: Generalized mesenteric congestive changes. No focal inflammatory process is seen through the generalized edema. There is a small volume of pelvic ascites. There is no free hemorrhage, free air or incarcerated hernia. There is mild body wall anasarca. Musculoskeletal: Mild dextroscoliosis, degenerative changes of the lumbar spine, with additional lumbar findings which will be described below. CT LUMBAR SPINE WITHOUT CONTRAST FINDINGS Segmentation: There are 5 lumbar type segments, but L5 is transitional demonstrating right hemisacralization. Alignment: There is mild lumbar dextroscoliosis apex at L1-2. There is a trace, likely discogenic retrolisthesis at L2-3 where there is disc space loss. No other listhesis or further malalignment is seen. Vertebrae: At L2-3 there are small anterior and posterior peridiscal endplate lucencies with slight fragmentation. At L4-5, there is peridiscal endplate fragmentation and subcortical lucency to the right. Both findings could have an infectious or posttraumatic basis. The patient does relate a history of recent back injury. The findings at L2-3 are less clearly posttraumatic given the location anteriorly and posteriorly. At L1, there is nondisplaced bony fragmentation of the anterior superior vertebral body without height loss. All vertebrae are grossly normal in heights. This latter  finding is almost certainly due to trauma. Other vertebrae are grossly intact. There is mild-to-moderate thoracolumbar spondylosis. Paraspinal soft tissues: There is paraspinal soft tissue thickening at L2-3. This could be due to paraspinal phlegmon or posttraumatic reactive soft tissue change. At L4-5 there is similar paraspinal soft tissue thickening on the right likely on the same etiology. There is no psoas abscess or localizing paraspinal fluid collection. Disc levels: T11-12 through L1-2: Inferior osteophytes. Normal disc heights. No herniations or stenoses. L2-3: Moderate disc space loss greater on the left. Left paracentral to left far lateral disc bulge extends into the left foramen with moderate to severe left foraminal stenosis. There is mild right foraminal stenosis. There is moderate effacement of the left subarticular zone but no significant spinal stenosis, no herniation is seen. L3-4: Normal disc height. Mild diffuse annular bulge. No herniation, canal zone or foraminal zone stenosis. L4-5: Mild disc space loss. Mild diffuse disc bulge without herniation or stenosis except for mild subarticular zone encroachment. The foramina appear clear. L5-S1: No abnormality. Other: Both SI joints are patent with slight spurring. No SI joint erosions. IMPRESSION: 1.  Numerous small cavitary lung base nodules, most likely due to multifocal septic lung emboli, most of them containing fluid compatible with these being abscesses. 2. Small pericardial effusion. 3. Generalized mesenteric congestive changes, small volume of pelvic ascites, and mild body wall anasarca. 4. Mild hepatic steatosis with hepatosplenomegaly and increased prominence of the hepatic portal vein and splenic vein. 5. Cystitis versus bladder hypertrophy or underdistention. 6. Age advanced aortoiliac atherosclerosis. 7. L2-3 disc space loss with small anterior and posterior peridiscal endplate lucencies with slight fragmentation. There is paraspinal  soft tissue thickening at this level which could be due to paraspinal phlegmon or posttraumatic reactive soft tissue change. 8. L4-5 peridiscal endplate fragmentation and subcortical lucency to the right, with paraspinal soft tissue thickening on the right. Findings could be due to discitis/osteomyelitis or posttraumatic change. 9. Nondisplaced bony fragmentation of the anterior superior L1 vertebral body without height loss. This is almost certainly due to trauma. 10. L2-3 left paracentral to left far lateral disc bulge with moderate to severe left foraminal stenosis and moderate effacement of the left subarticular zone. 11. L4-5 mild subarticular zone encroachment. 12. Lumbar MRI without and with contrast may be helpful to further characterize these findings. Aortic Atherosclerosis (ICD10-I70.0). Electronically Signed   By: Francis Quam M.D.   On: 03/14/2024 22:24   CT L-SPINE NO CHARGE Result Date: 03/14/2024 CLINICAL DATA:  Suspected sepsis. Low energy x5 weeks worsening in the last week. Infiltrates on chest x-ray. Abdominal pain, acute, nonlocalized with known herniated lumbar disc with recent re-injury per family. EXAM: CT ABDOMEN AND PELVIS WITH CONTRAST CT LUMBAR SPINE WITHOUT CONTRAST TECHNIQUE: Multidetector CT imaging of the abdomen and pelvis was performed using the standard protocol following bolus administration of intravenous contrast. Multidetector CT imaging of the lumbar spine was performed without the use of contrast. Multiplanar CT image reconstructions were also generated and reviewed. RADIATION DOSE REDUCTION: This exam was performed according to the departmental dose-optimization program which includes automated exposure control, adjustment of the mA and/or kV according to patient size and/or use of iterative reconstruction technique. CONTRAST:  OMNIPAQUE  IOHEXOL  300 MG/ML  SOLN COMPARISON:  CT abdomen and pelvis with contrast 02/02/2015. FINDINGS: CT ABDOMEN AND PELVIS WITH  CONTRAST FINDINGS Lower chest: There are numerous diffusely scattered small cavitary lesions both lung bases, seen as nodular infiltrates on chest x-ray. Findings are most likely due to multifocal septic lung emboli, and most of them contain fluid compatible with these being abscesses. The largest of these is in the anteromedial right middle lobe measuring 3.2 cm on the first image. The largest in the right lower lobe is in the anterior basal segment measuring 2.7 cm on 6:8. These are less numerous in the left lung base. The largest left lung base cavity is in the lingular base measuring 2.7 cm on 6:8 and there is patchy airspace disease associated with smaller cavitary foci in the posterior basal left lower lobe. There is a small pericardial effusion. The pericardial fluid is low in density. The cardiac size is normal. Hepatobiliary: The liver is mildly steatotic measuring 21 cm length. There is no mass enhancement or abscess. Gallbladder and bile ducts are unremarkable. Pancreas: No abnormality Spleen: No mass. Splenomegaly. The spleen measures 12.3 x 4.1 x 16 cm. Adrenals/Urinary Tract: there is no adrenal or renal mass enhancement, no urinary stone or obstruction. There is a Bosniak 1 cyst in the superior pole of the right kidney measuring 1.2 cm, 13 Hounsfield units. Smaller Bosniak 2 cortical cyst in the  mid pole of this kidney is too small characterize. No follow-up imaging is recommended. Both kidneys are otherwise unremarkable and secrete symmetrically on the delayed images. The bladder is mildly thickened which could be due to underdistention, hypertrophy or cystitis. Stomach/Bowel: No dilatation. No overt wall thickening. Limited assessment without enteric contrast. Normal caliber appendix. Scattered sigmoid diverticulosis. No evidence of acute diverticulitis. Vascular/Lymphatic: Patchy aortoiliac calcific plaque, age advanced, increased from 9 years ago. Clinical correlation advised. There is a prominent  hepatic portal vein measuring 16 mm and increasingly engorged splenic vein. The SMV and tributaries are normal caliber. No adenopathy is seen. Reproductive: No prostatomegaly.  Pelvic phleboliths. Other: Generalized mesenteric congestive changes. No focal inflammatory process is seen through the generalized edema. There is a small volume of pelvic ascites. There is no free hemorrhage, free air or incarcerated hernia. There is mild body wall anasarca. Musculoskeletal: Mild dextroscoliosis, degenerative changes of the lumbar spine, with additional lumbar findings which will be described below. CT LUMBAR SPINE WITHOUT CONTRAST FINDINGS Segmentation: There are 5 lumbar type segments, but L5 is transitional demonstrating right hemisacralization. Alignment: There is mild lumbar dextroscoliosis apex at L1-2. There is a trace, likely discogenic retrolisthesis at L2-3 where there is disc space loss. No other listhesis or further malalignment is seen. Vertebrae: At L2-3 there are small anterior and posterior peridiscal endplate lucencies with slight fragmentation. At L4-5, there is peridiscal endplate fragmentation and subcortical lucency to the right. Both findings could have an infectious or posttraumatic basis. The patient does relate a history of recent back injury. The findings at L2-3 are less clearly posttraumatic given the location anteriorly and posteriorly. At L1, there is nondisplaced bony fragmentation of the anterior superior vertebral body without height loss. All vertebrae are grossly normal in heights. This latter finding is almost certainly due to trauma. Other vertebrae are grossly intact. There is mild-to-moderate thoracolumbar spondylosis. Paraspinal soft tissues: There is paraspinal soft tissue thickening at L2-3. This could be due to paraspinal phlegmon or posttraumatic reactive soft tissue change. At L4-5 there is similar paraspinal soft tissue thickening on the right likely on the same etiology. There  is no psoas abscess or localizing paraspinal fluid collection. Disc levels: T11-12 through L1-2: Inferior osteophytes. Normal disc heights. No herniations or stenoses. L2-3: Moderate disc space loss greater on the left. Left paracentral to left far lateral disc bulge extends into the left foramen with moderate to severe left foraminal stenosis. There is mild right foraminal stenosis. There is moderate effacement of the left subarticular zone but no significant spinal stenosis, no herniation is seen. L3-4: Normal disc height. Mild diffuse annular bulge. No herniation, canal zone or foraminal zone stenosis. L4-5: Mild disc space loss. Mild diffuse disc bulge without herniation or stenosis except for mild subarticular zone encroachment. The foramina appear clear. L5-S1: No abnormality. Other: Both SI joints are patent with slight spurring. No SI joint erosions. IMPRESSION: 1. Numerous small cavitary lung base nodules, most likely due to multifocal septic lung emboli, most of them containing fluid compatible with these being abscesses. 2. Small pericardial effusion. 3. Generalized mesenteric congestive changes, small volume of pelvic ascites, and mild body wall anasarca. 4. Mild hepatic steatosis with hepatosplenomegaly and increased prominence of the hepatic portal vein and splenic vein. 5. Cystitis versus bladder hypertrophy or underdistention. 6. Age advanced aortoiliac atherosclerosis. 7. L2-3 disc space loss with small anterior and posterior peridiscal endplate lucencies with slight fragmentation. There is paraspinal soft tissue thickening at this level which could be due  to paraspinal phlegmon or posttraumatic reactive soft tissue change. 8. L4-5 peridiscal endplate fragmentation and subcortical lucency to the right, with paraspinal soft tissue thickening on the right. Findings could be due to discitis/osteomyelitis or posttraumatic change. 9. Nondisplaced bony fragmentation of the anterior superior L1 vertebral  body without height loss. This is almost certainly due to trauma. 10. L2-3 left paracentral to left far lateral disc bulge with moderate to severe left foraminal stenosis and moderate effacement of the left subarticular zone. 11. L4-5 mild subarticular zone encroachment. 12. Lumbar MRI without and with contrast may be helpful to further characterize these findings. Aortic Atherosclerosis (ICD10-I70.0). Electronically Signed   By: Francis Quam M.D.   On: 03/14/2024 22:24   DG Chest Port 1 View if patient is in a treatment room. Result Date: 03/14/2024 EXAM: 1 VIEW(S) XRAY OF THE CHEST 03/14/2024 08:26:20 PM COMPARISON: None available. CLINICAL HISTORY: Suspected Sepsis. Table formatting from the original note was not included.; Per triage: ; Pt c/o being sick for a while. Wife states re injured his herniated disk so he started just lying around for about 5 weeks but worse the last week and not eating. Losing week. Pt appears slightly jaundiced. Smells of urine. A/o but slow to respond. ; coughing FINDINGS: LUNGS AND PLEURA: Patchy nodular airspace opacities in both lungs. No pulmonary edema. No pleural effusion. No pneumothorax. HEART AND MEDIASTINUM: No acute abnormality of the cardiac and mediastinal silhouettes. BONES AND SOFT TISSUES: No acute osseous abnormality. IMPRESSION: 1. Patchy nodular airspace opacities in both lungs, concerning for infection. Electronically signed by: Ozell Daring MD 03/14/2024 08:32 PM EDT RP Workstation: [id number]    EKG: I independently viewed the EKG done and my findings are as followed: Sinus tachycardia at rate of 147 bpm  Assessment/Plan Present on Admission:  Septic embolism (HCC)  Sepsis due to pneumonia [facility])  Principal Problem:   Sepsis due to pneumonia [facility]) Active Problems:   Septic embolism (HCC)   Pressure injury of skin   Lactic acidosis   Osteomyelitis of lumbar spine (HCC)   Thrombocytopenia   Transaminasemia   Hypoalbuminemia   Failure to  thrive in adult  Sepsis due to pneumonia Septic embolism Patient met sepsis criteria due to being tachycardic, tachypneic (met SIRS criteria).  Chest x-ray was suggestive of infection and CT abdomen and pelvis was suggestive of septic embolism and abdomen meets sepsis criteria. IV hydration per sepsis protocol was provided Patient was treated with IV ceftriaxone  and azithromycin ,  we shall continue same at this time with plan to de-escalate/discontinue based on blood culture, sputum culture, urine Legionella, strep pneumo and procalcitonin Continue Tylenol  as needed Continue Mucinex , incentive spirometry, flutter valve   3. Lactic acidosis secondary to above -resolved Lactic acid 2.5 > 1.7  4.  Osteomyelitis of the lumbar spine CT abdomen and pelvis was suggestive of discitis/osteomyelitis of posttraumatic change Patient was empirically started on IV vancomycin  and cefepime, which I will continue same at this time Blood culture pending Continue IV Dilaudid  0.5 mg every 3 hours as needed Continue fall precaution Continue PT/OT eval and treat MRI of lumbar spine will be done to rule out osteomyelitis  5.  Thrombocytopenia 6.  Transaminasemia Platelets 104.  AST 48, ALT 23, ALP 362 CT abdomen and pelvis showed mild hepatic steatosis with portal spinal megaly and increased prominence of the hepatic portal vein and splenic vein  7.  Hypoalbuminemia possibly due to moderate protein calorie malnutrition 8.  Failure to thrive in adult Albumin 2.7,  protein supplement will be provided Continue full liquid diet with plan to advance diet as tolerated Dietitian will be consulted and we shall await further recommendations  9.  Pressure ulcer to sacrum-stage II Wound nurse will be consulted  DVT prophylaxis: Lovenox   Code Status: Full code  Family Communication: Mother and wife at bedside  Consults: None  Severity of Illness: The appropriate patient status for this patient is INPATIENT.  Inpatient status is judged to be reasonable and necessary in order to provide the required intensity of service to ensure the patient's safety. The patient's presenting symptoms, physical exam findings, and initial radiographic and laboratory data in the context of their chronic comorbidities is felt to place them at high risk for further clinical deterioration. Furthermore, it is not anticipated that the patient will be medically stable for discharge from the hospital within 2 midnights of admission.   * I certify that at the point of admission it is my clinical judgment that the patient will require inpatient hospital care spanning beyond 2 midnights from the point of admission due to high intensity of service, high risk for further deterioration and high frequency of surveillance required.*  Author: Trezure Cronk, DO 03/15/2024 3:55 AM  For on call review [email or web address].

## 2024-03-15 NOTE — TOC Progression Note (Signed)
 Transition of Care [facility]) - Progression Note    Patient Details  Name: RAMARI BRAY MRN: [id number] Date of Birth: March 26, 1976  [facility] CM/SW Contact  Lucie Lunger, [place] Phone Number: 03/15/2024, 2:51 PM  Clinical Narrative:    CSW updated that PT is recommending follow up therapy services. CSW spoke to pt about OP PT referral as HH is not possible due to no insurance. Pt states he is agreeable to OP PT referral. CSW made referral at this time. TOC to follow.     Barriers to Discharge: Continued Medical Work up               Expected Discharge Plan and Services                                               Social Drivers of Health (SDOH) Interventions SDOH Screenings   Food Insecurity: No Food Insecurity (03/15/2024)  Housing: Low Risk  (03/15/2024)  Transportation Needs: No Transportation Needs (03/15/2024)  Utilities: Not At Risk (03/15/2024)  Tobacco Use: High Risk (03/14/2024)    Readmission Risk Interventions    03/15/2024    8:30 AM  Readmission Risk Prevention Plan  Transportation Screening Complete  Home Care Screening Complete  Medication Review (RN CM) Complete

## 2024-03-15 NOTE — ED Notes (Addendum)
Pt given a soda.

## 2024-03-15 NOTE — Plan of Care (Addendum)
 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 

## 2024-03-16 LAB — COMPREHENSIVE METABOLIC PANEL WITH GFR
ALT: 21 U/L (ref 0–44)
AST: 41 U/L (ref 15–41)
Albumin: 2.2 g/dL — ABNORMAL LOW (ref 3.5–5.0)
Alkaline Phosphatase: 279 U/L — ABNORMAL HIGH (ref 38–126)
Anion gap: 8 (ref 5–15)
BUN: 40 mg/dL — ABNORMAL HIGH (ref 6–20)
CO2: 21 mmol/L — ABNORMAL LOW (ref 22–32)
Calcium: 8.4 mg/dL — ABNORMAL LOW (ref 8.9–10.3)
Chloride: 107 mmol/L (ref 98–111)
Creatinine, Ser: 1.04 mg/dL (ref 0.61–1.24)
GFR, Estimated: >60 mL/min (ref >60)
Glucose, Bld: 114 mg/dL — ABNORMAL HIGH (ref 70–99)
Potassium: 4.4 mmol/L (ref 3.5–5.1)
Sodium: 136 mmol/L (ref 135–145)
Total Bilirubin: 0.9 mg/dL (ref 0.0–1.2)
Total Protein: 6.4 g/dL — ABNORMAL LOW (ref 6.5–8.1)

## 2024-03-16 LAB — CBC WITH DIFFERENTIAL/PLATELET
Abs Immature Granulocytes: 0.1 K/uL — ABNORMAL HIGH (ref 0.00–0.07)
Band Neutrophils: 1 %
Basophils Absolute: 0 K/uL (ref 0.0–0.1)
Basophils Relative: 0 %
Eosinophils Absolute: 0 K/uL (ref 0.0–0.5)
Eosinophils Relative: 0 %
HCT: 28.8 % — ABNORMAL LOW (ref 39.0–52.0)
Hemoglobin: 9.2 g/dL — ABNORMAL LOW (ref 13.0–17.0)
Lymphocytes Relative: 10 %
Lymphs Abs: 1.1 K/uL (ref 0.7–4.0)
MCH: 29.5 pg (ref 26.0–34.0)
MCHC: 31.9 g/dL (ref 30.0–36.0)
MCV: 92.3 fL (ref 80.0–100.0)
Metamyelocytes Relative: 1 %
Monocytes Absolute: 0.7 K/uL (ref 0.1–1.0)
Monocytes Relative: 6 %
Neutro Abs: 9.2 K/uL — ABNORMAL HIGH (ref 1.7–7.7)
Neutrophils Relative %: 82 %
Platelets: 131 K/uL — ABNORMAL LOW (ref 150–400)
RBC: 3.12 MIL/uL — ABNORMAL LOW (ref 4.22–5.81)
RDW: 14.2 % (ref 11.5–15.5)
Smear Review: NORMAL
WBC: 11.1 K/uL — ABNORMAL HIGH (ref 4.0–10.5)
nRBC: 0 % (ref 0.0–0.2)

## 2024-03-16 LAB — ECHOCARDIOGRAM COMPLETE
Area-P 1/2: 3.03 cm2
Height: 72 in
S' Lateral: 3.7 cm
Weight: 2204.6 [oz_av]

## 2024-03-16 LAB — LEGIONELLA PNEUMOPHILA SEROGP 1 UR AG: L. pneumophila Serogp 1 Ur Ag: NEGATIVE

## 2024-03-16 MED ORDER — ALPRAZOLAM 0.5 MG PO TABS
0.5000 mg | ORAL_TABLET | Freq: Three times a day (TID) | ORAL | Status: DC | PRN
  Administered 2024-03-16 – 2024-03-25 (×13): 0.5 mg via ORAL
  Filled 2024-03-16 (×14): qty 1

## 2024-03-16 MED ORDER — HALOPERIDOL LACTATE 5 MG/ML IJ SOLN: 2.0000 mg | Freq: Four times a day (QID) | INTRAMUSCULAR | Status: DC | PRN

## 2024-03-16 NOTE — Progress Notes (Signed)
 Received at report that patient was pulling things off and having several incontinent episodes even with urinal at bedside. Patient continues to remove oxygen, malewick and clothing. Patient educated several times regarding the importance of wearing his nasal cannula. MD Shahmehdi made aware. Mittens applied and new orders placed by MD.

## 2024-03-16 NOTE — Plan of Care (Signed)

## 2024-03-16 NOTE — Progress Notes (Signed)
 PROGRESS NOTE    Patient: [name] [name]                            PCP: Bertell Satterfield, MD                    DOB: 1976/04/25            DOA: 03/14/2024 FMW:[id number]             DOS: 03/16/2024, 9:57 AM   LOS: 1 day   Date of Service: The patient was seen and examined on 03/16/2024  Subjective:   The patient was seen and examined this morning.  Hemodynamically stable, no acute distress Still complaining of severe generalized weaknesses, minimal mobility due to back pain   Brief Narrative:   [name] [name] is a 48 y.o. male with medical history significant of anxiety disorder, tobacco abuse, chronic back pain who presents to the emergency department from home due to generalized weakness and poor oral intake. Patient complained of hurting his back about 5 weeks ago and he has been laying in bed most of the time for about a month.  He believed that he aggravated the back from sleeping in an uncomfortable position.  He denied any falls. For about a week now, wife at bedside reports poor oral intake but has been tolerating liquids without any difficulty.  He denies dysphagia.  But having productive cough of rusty colored sputum and subjective fever within the last few days. Patient denies any IV drug use in the last 6 months, alcohol use in past few months and cigarettes use in the past few days.   ED Course:  Patient was tachypneic, tachycardic, but other vital signs are within normal range.  Workup in the ED showed normocytic anemia, thrombocytopenia.  BMP shows sodium 133, potassium 4.8, chloride 98, bicarb 21, blood glucose 129, BUN 46, creatinine 1.45 (last lab for comparison was creatinine of 0.88 and this was 7 years ago), albumin 2.7, AST 48, ALT 23, ALP 362, total bilirubin 1.6, lactic acid 2.5 > 1.7.  TSH 1.730, ammonia 28.  Blood culture pending CT abdomen pelvis showed small cavitary lung base nodules, most likely due to multifocal septic lung emboli, most of them continue  fluid compatible with these being abscesses.  Small pericardial effusion. Chest x-ray showed patchy nodular airspace opacities in both lungs, concerning for infection Patient was treated with IV ceftriaxone  and azithromycin  were given for pneumonia, cefepime and vancomycin  were given for osteomyelitis of the spine. morphine  was given, and Zofran .        Assessment & Plan:   Principal Problem:   Sepsis due to pneumonia [facility]) Active Problems:   Septic embolism (HCC)   Pressure injury of skin   Lactic acidosis   Osteomyelitis of lumbar spine (HCC)   Thrombocytopenia   Transaminasemia   Hypoalbuminemia   Failure to thrive in adult   Sepsis due to pneumonia/ small Septic embolism /Bacteremia/ L3-4, L5 -S1 lumbar osteomyelitis- discitis /small BL psoas abscess  - Improved sepsis physiology, hemodynamically stable - Continue IVF, IV antibiotics  Initial blood cultures - 1/4 positive for Staph aureus 03/16/24 -blood cultures -   -ID Dr. Vu-recommending inpatient treatment IV antibiotics 3-4 weeks (not an outpatient candidate) - Neurosurgery-no surgical invention recommended - IR-deferred disc aspiration at this point, continue antibiotics-discussed with Dr. Overton   POA: Patient met sepsis criteria due to being tachycardic, tachypneic -  Chest x-ray was suggestive of infection and CT abdomen and pelvis was suggestive of septic embolism  -  IV hydration per sepsis protocol was provided -Was started on IV ceftriaxone  and azithromycin  then Cefepime and Vancomycin -currently on Ancef  only per ID - Will follow-up with sputum culture, urine Legionella, strep pneumo and procalcitonin 3.61 -Lactic acid 2.5>>> 1.7 Continue Tylenol  as needed Continue Mucinex , incentive spirometry, flutter valve    Lactic acidosis secondary to above - resolved Lactic acid 2.5 > 1.7   Osteomyelitis of the lumbar spine CT abdomen and pelvis was suggestive of discitis/osteomyelitis of posttraumatic  change Patient was empirically started on IV vancomycin  and Cefepime,  -- Consulted ID -appreciate input from Dr.Vu  - Management as above  Continue IV Dilaudid  0.5 mg every 3 hours as needed Continue fall precaution Continue PT/OT eval and treat  MRI of lumbar spine: There is discitis/osteomyelitis at L3-L4 and L5-S1 with a septic right facet joint at L5-S1. There is paraspinal inflammation with small bilateral psoas abscesses.  The bone marrow signal is diffusely abnormal, possibly due to anemia.      Thrombocytopenia/  Transaminasemia -Much improved POA: Platelets 104.  AST 48, ALT 23, ALP 362    Latest Ref Rng & Units 03/16/2024    4:36 AM 03/15/2024    4:11 AM 03/14/2024    7:49 PM  Hepatic Function  Total Protein 6.5 - 8.1 g/dL 6.4  6.3  7.7   Albumin 3.5 - 5.0 g/dL 2.2  2.3  2.7   AST 15 - 41 U/L 41  37  48   ALT 0 - 44 U/L 21  20  23    Alk Phosphatase 38 - 126 U/L 279  271  362   Total Bilirubin 0.0 - 1.2 mg/dL 0.9  1.0  1.6    CT abdomen and pelvis showed mild hepatic steatosis with portal spinal megaly and increased prominence of the hepatic portal vein and splenic vein    Hypoalbuminemia possibly due to moderate protein calorie malnutrition   Failure to thrive in adult Albumin 2.7, protein supplement will be provided Continue full liquid diet with plan to advance diet as tolerated Dietitian will be consulted and we shall await further recommendations   Pressure ulcer to sacrum-stage II Wound nurse will be consulted     ----------------------------------------------------------------------------------------------------------------------------- Nutritional status: With failure to thrive/hypoalbuminemia/ The patient's BMI is: Body mass index is 18.69 kg/m. I agree with the assessment and plan as outlined   Skin Assessment: I have examined the patient's skin and I agree with the wound assessment as performed by wound care team As outlined belowe: Wound  03/15/24 0130 Pressure Injury Sacrum Medial Deep Tissue Pressure Injury - Purple or maroon localized area of discolored intact skin or blood-filled blister due to damage of underlying soft tissue from pressure and/or shear. (Active)     ---------------------------------------------------------------------------------------------------------------------------------------------------- Cultures; Blood Cultures x 2 >> 1/4 Staph aureus Repeat blood cultures 03/16/2024   Antibiotics: Azithromycin /vancomycin /cefepime IV discontinued 03/15/2024 03/15/2024 started on Ancef  >>  ------------------------------------------------------------------------------------------------------------------------------------------------  DVT prophylaxis:  SCDs Start: 03/15/24 0330   Code Status:   Code Status: Full Code  Family Communication: No family member present at bedside-  -Advance care planning has been discussed.   Admission status:   Status is: Inpatient Needing IV fluids, IV antibiotics   Disposition: From  - home             Planning for discharge in 1-2 days   Procedures:   No admission procedures for hospital  encounter.   Antimicrobials:  Anti-infectives (From admission, onward)    Start     Dose/Rate Route Frequency Ordered Stop   03/15/24 2345  ceFAZolin  (ANCEF ) IVPB 2g/100 mL premix        2 g 200 mL/hr over 30 Minutes Intravenous Every 8 hours 03/15/24 2253     03/15/24 2200  azithromycin  (ZITHROMAX ) 500 mg in sodium chloride  0.9 % 250 mL IVPB  Status:  Discontinued        500 mg 250 mL/hr over 60 Minutes Intravenous Every 24 hours 03/15/24 0332 03/15/24 1030   03/15/24 2000  vancomycin  (VANCOREADY) IVPB 1750 mg/350 mL  Status:  Discontinued        1,750 mg 175 mL/hr over 120 Minutes Intravenous Every 24 hours 03/15/24 1220 03/15/24 2256   03/15/24 1000  ceFEPIme (MAXIPIME) 2 g in sodium chloride  0.9 % 100 mL IVPB  Status:  Discontinued        2 g 200 mL/hr over 30 Minutes  Intravenous Every 12 hours 03/15/24 0347 03/15/24 1210   03/15/24 1000  vancomycin  (VANCOREADY) IVPB 750 mg/150 mL  Status:  Discontinued        750 mg 150 mL/hr over 60 Minutes Intravenous Every 12 hours 03/15/24 0353 03/15/24 1220   03/15/24 0000  vancomycin  (VANCOREADY) IVPB 1500 mg/300 mL        1,500 mg 150 mL/hr over 120 Minutes Intravenous  Once 03/14/24 2349 03/15/24 0826   03/15/24 0000  ceFEPIme (MAXIPIME) 2 g in sodium chloride  0.9 % 100 mL IVPB        2 g 200 mL/hr over 30 Minutes Intravenous  Once 03/14/24 2349 03/15/24 0101   03/14/24 2115  cefTRIAXone  (ROCEPHIN ) 2 g in sodium chloride  0.9 % 100 mL IVPB        2 g 200 mL/hr over 30 Minutes Intravenous Once 03/14/24 2108 03/14/24 2221   03/14/24 2115  azithromycin  (ZITHROMAX ) 500 mg in sodium chloride  0.9 % 250 mL IVPB        500 mg 250 mL/hr over 60 Minutes Intravenous  Once 03/14/24 2108 03/14/24 2249        Medication:   (feeding supplement) PROSource Plus  30 mL Oral BID BM   ascorbic acid  500 mg Oral BID   dextromethorphan-guaiFENesin   1 tablet Oral BID   feeding supplement  237 mL Oral TID BM   multivitamin with minerals  1 tablet Oral Daily   zinc sulfate (50mg  elemental zinc)  220 mg Oral Daily    acetaminophen  **OR** acetaminophen , HYDROmorphone  (DILAUDID ) injection, ondansetron  **OR** ondansetron  (ZOFRAN ) IV, mouth rinse   Objective:   Vitals:   03/15/24 1358 03/15/24 1605 03/15/24 2058 03/16/24 0534  BP: 117/80 113/75 137/89 134/82  Pulse: (!) 107 90 91   Resp: 18 18 16 19   Temp: 98 F (36.7 C) 97.6 F (36.4 C) 98.6 F (37 C) 98.3 F (36.8 C)  TempSrc: Oral Oral Oral Oral  SpO2: 94% 93% 96% 95%  Weight:      Height:        Intake/Output Summary (Last 24 hours) at 03/16/2024 0957 Last data filed at 03/16/2024 0841 Gross per 24 hour  Intake 0 ml  Output 350 ml  Net -350 ml   Filed Weights   03/15/24 0150  Weight: 62.5 kg     Physical examination:        General:  AAO x 3,   cooperative, no distress;   HEENT:  Normocephalic, PERRL, otherwise with in  Normal limits   Neuro:  CNII-XII intact. , normal motor and sensation, reflexes intact   Lungs:   Clear to auscultation BL, Respirations unlabored,  No wheezes / crackles  Cardio:    S1/S2, RRR, No murmure, No Rubs or Gallops   Abdomen:  Soft, non-tender, bowel sounds active all four quadrants, no guarding or peritoneal signs.  Muscular  skeletal:  Mid lumbar pain, therefore limited range of motion Difficulty ambulating Limited exam -global generalized weaknesses - in bed, able to move all 4 extremities,   2+ pulses,  symmetric, No pitting edema  Skin:  Dry, warm to touch, negative for any Rashes,  Wounds: Please see nursing documentation  Wound 03/15/24 0130 Pressure Injury Sacrum Medial Deep Tissue Pressure Injury - Purple or maroon localized area of discolored intact skin or blood-filled blister due to damage of underlying soft tissue from pressure and/or shear. (Active)         ------------------------------------------------------------------------------------------------------------------------------------------    LABs:     Latest Ref Rng & Units 03/16/2024    4:36 AM 03/15/2024    4:11 AM 03/14/2024    7:49 PM  CBC  WBC 4.0 - 10.5 K/uL 11.1  6.6  9.6   Hemoglobin 13.0 - 17.0 g/dL 9.2  8.6  89.4   Hematocrit 39.0 - 52.0 % 28.8  27.4  32.4   Platelets 150 - 400 K/uL 131  93  104       Latest Ref Rng & Units 03/16/2024    4:36 AM 03/15/2024    4:11 AM 03/14/2024    7:49 PM  CMP  Glucose 70 - 99 mg/dL 885  890  870   BUN 6 - 20 mg/dL 40  40  46   Creatinine 0.61 - 1.24 mg/dL 8.95  8.79  8.54   Sodium 135 - 145 mmol/L 136  136  133   Potassium 3.5 - 5.1 mmol/L 4.4  4.5  4.8   Chloride 98 - 111 mmol/L 107  104  98   CO2 22 - 32 mmol/L 21  22  21    Calcium 8.9 - 10.3 mg/dL 8.4  8.1  8.6   Total Protein 6.5 - 8.1 g/dL 6.4  6.3  7.7   Total Bilirubin 0.0 - 1.2 mg/dL 0.9  1.0  1.6   Alkaline Phos  38 - 126 U/L 279  271  362   AST 15 - 41 U/L 41  37  48   ALT 0 - 44 U/L 21  20  23         Micro Results Recent Results (from the past 240 hours)  Culture, blood (Routine x 2)     Status: None (Preliminary result)   Collection Time: 03/14/24  7:34 PM   Specimen: Left Antecubital; Blood  Result Value Ref Range Status   Specimen Description   Final    LEFT ANTECUBITAL Performed at [facility], [address], [place], [place] [address]    Special Requests   Final    BOTTLES DRAWN AEROBIC ONLY Blood Culture results may not be optimal due to an inadequate volume of blood received in culture bottles Performed at [facility], [address] [place], [place] [address]    Culture  Setup Time   Final    GRAM POSITIVE COCCI AEROBIC BOTTLE ONLY Gram Stain Report Called to,Read Back By and Verified With: JACKSON @ 1141 ON 100325 BY CHARLOTT CROME RN CLENT SPRANG [id number] AT 1949 BY EC Performed at [facility],  [address] [place], [place] [address]    Culture GRAM POSITIVE COCCI  Final   Report Status PENDING  Incomplete  Blood Culture ID Panel (Reflexed)     Status: Abnormal   Collection Time: 03/14/24  7:34 PM  Result Value Ref Range Status   Enterococcus faecalis NOT DETECTED NOT DETECTED Final   Enterococcus Faecium NOT DETECTED NOT DETECTED Final   Listeria monocytogenes NOT DETECTED NOT DETECTED Final   Staphylococcus species DETECTED (A) NOT DETECTED Final    Comment: CRITICAL RESULT CALLED TO, READ BACK BY AND VERIFIED WITH: RN LAVERLE SPRANG [id number] AT 1949 BY EC    Staphylococcus aureus (BCID) DETECTED (A) NOT DETECTED Final    Comment: CRITICAL RESULT CALLED TO, READ BACK BY AND VERIFIED WITH: RN CLENT SPRANG [id number] AT 1949 BY EC    Staphylococcus epidermidis NOT DETECTED NOT DETECTED Final   Staphylococcus lugdunensis NOT DETECTED NOT DETECTED Final   Streptococcus species NOT DETECTED NOT DETECTED Final   Streptococcus agalactiae NOT DETECTED NOT  DETECTED Final   Streptococcus pneumoniae NOT DETECTED NOT DETECTED Final   Streptococcus pyogenes NOT DETECTED NOT DETECTED Final   A.calcoaceticus-baumannii NOT DETECTED NOT DETECTED Final   Bacteroides fragilis NOT DETECTED NOT DETECTED Final   Enterobacterales NOT DETECTED NOT DETECTED Final   Enterobacter cloacae complex NOT DETECTED NOT DETECTED Final   Escherichia coli NOT DETECTED NOT DETECTED Final   Klebsiella aerogenes NOT DETECTED NOT DETECTED Final   Klebsiella oxytoca NOT DETECTED NOT DETECTED Final   Klebsiella pneumoniae NOT DETECTED NOT DETECTED Final   Proteus species NOT DETECTED NOT DETECTED Final   Salmonella species NOT DETECTED NOT DETECTED Final   Serratia marcescens NOT DETECTED NOT DETECTED Final   Haemophilus influenzae NOT DETECTED NOT DETECTED Final   Neisseria meningitidis NOT DETECTED NOT DETECTED Final   Pseudomonas aeruginosa NOT DETECTED NOT DETECTED Final   Stenotrophomonas maltophilia NOT DETECTED NOT DETECTED Final   Candida albicans NOT DETECTED NOT DETECTED Final   Candida auris NOT DETECTED NOT DETECTED Final   Candida glabrata NOT DETECTED NOT DETECTED Final   Candida krusei NOT DETECTED NOT DETECTED Final   Candida parapsilosis NOT DETECTED NOT DETECTED Final   Candida tropicalis NOT DETECTED NOT DETECTED Final   Cryptococcus neoformans/gattii NOT DETECTED NOT DETECTED Final   Meth resistant mecA/C and MREJ NOT DETECTED NOT DETECTED Final    Comment: Performed at [facility], [address] [place], [place] [address]  Culture, blood (Routine x 2)     Status: None (Preliminary result)   Collection Time: 03/14/24 11:21 PM   Specimen: BLOOD  Result Value Ref Range Status   Specimen Description BLOOD BLOOD RIGHT ARM  Final   Special Requests AEROBIC BOTTLE ONLY Blood Culture adequate volume  Final   Culture   Final    NO GROWTH < 12 HOURS Performed at [facility], [address], [place], [place] [address]    Report Status PENDING   Incomplete  Culture, blood (Routine X 2) w Reflex to ID Panel     Status: None (Preliminary result)   Collection Time: 03/16/24  4:29 AM   Specimen: BLOOD LEFT HAND  Result Value Ref Range Status   Specimen Description BLOOD LEFT HAND AEROBIC BOTTLE ONLY  Final   Special Requests   Final    Blood Culture results may not be optimal due to an inadequate volume of blood received in culture bottles Performed at [facility], [address] [place], [place] [address]  Culture PENDING  Incomplete   Report Status PENDING  Incomplete  Culture, blood (Routine X 2) w Reflex to ID Panel     Status: None (Preliminary result)   Collection Time: 03/16/24  4:36 AM   Specimen: BLOOD LEFT HAND  Result Value Ref Range Status   Specimen Description   Final    BLOOD LEFT HAND BOTTLES DRAWN AEROBIC AND ANAEROBIC   Special Requests   Final    Blood Culture adequate volume Performed at [facility], [address], [place], [place] [address]    Culture PENDING  Incomplete   Report Status PENDING  Incomplete    Radiology Reports ECHOCARDIOGRAM COMPLETE Result Date: 03/16/2024    ECHOCARDIOGRAM REPORT   Patient Name:   CHANTRY HEADEN [facility] Date of Exam: 03/15/2024 Medical Rec #:  [id number]        Height:       72.0 in Accession #:    [id number]       Weight:       137.8 lb Date of Birth:  [birth year]        BSA:          1.819 m Patient Age:    48 years         BP:           100/66 mmHg Patient Gender: M                HR:           84 bpm. Exam Location:  [facility] Procedure: 2D Echo, Cardiac Doppler and Color Doppler (Both Spectral and Color            Flow Doppler were utilized during procedure). Indications:    Endocarditis l38  History:        Patient has prior history of Echocardiogram examinations, most                 recent 01/21/2014. Risk Factors:Current Smoker and Hypertension.                 Sepsis due to pneumonia [facility]), Septic embolism (HCC).  Sonographer:    Aida Pizza RCS Referring Phys: [id number]  TRUNG T VU IMPRESSIONS  1. Left ventricular ejection fraction, by estimation, is 60 to 65%. The left ventricle has normal function. The left ventricle has no regional wall motion abnormalities. Left ventricular diastolic parameters were normal.  2. Right ventricular systolic function is normal. The right ventricular size is normal. Tricuspid regurgitation signal is inadequate for assessing PA pressure.  3. Left atrial size was mildly dilated.  4. The mitral valve is normal in structure. No evidence of mitral valve regurgitation. No evidence of mitral stenosis.  5. The aortic valve is tricuspid. Aortic valve regurgitation is not visualized. No aortic stenosis is present.  6. The inferior vena cava is normal in size with greater than 50% respiratory variability, suggesting right atrial pressure of 3 mmHg. Comparison(s): No prior Echocardiogram. Conclusion(s)/Recommendation(s): No evidence of valvular vegetations on this transthoracic echocardiogram. Consider a transesophageal echocardiogram to exclude infective endocarditis if clinically indicated. FINDINGS  Left Ventricle: Left ventricular ejection fraction, by estimation, is 60 to 65%. The left ventricle has normal function. The left ventricle has no regional wall motion abnormalities. Strain was performed and the global longitudinal strain is indeterminate. The left ventricular internal cavity size was normal in size. There is no left ventricular hypertrophy. Left ventricular diastolic parameters were normal. Right Ventricle: The right ventricular size is normal. No  increase in right ventricular wall thickness. Right ventricular systolic function is normal. Tricuspid regurgitation signal is inadequate for assessing PA pressure. Left Atrium: Left atrial size was mildly dilated. Right Atrium: Right atrial size was normal in size. Pericardium: There is no evidence of pericardial effusion. Mitral Valve: The mitral valve is normal in structure. No evidence of mitral  valve regurgitation. No evidence of mitral valve stenosis. Tricuspid Valve: The tricuspid valve is normal in structure. Tricuspid valve regurgitation is trivial. No evidence of tricuspid stenosis. Aortic Valve: The aortic valve is tricuspid. Aortic valve regurgitation is not visualized. No aortic stenosis is present. Pulmonic Valve: The pulmonic valve was not well visualized. Pulmonic valve regurgitation is not visualized. No evidence of pulmonic stenosis. Aorta: The aortic root is normal in size and structure. Venous: The inferior vena cava is normal in size with greater than 50% respiratory variability, suggesting right atrial pressure of 3 mmHg. IAS/Shunts: No atrial level shunt detected by color flow Doppler. Additional Comments: 3D was performed not requiring image post processing on an independent workstation and was indeterminate.  LEFT VENTRICLE PLAX 2D LVIDd:         5.30 cm   Diastology LVIDs:         3.70 cm   LV e' medial:    11.30 cm/s LV PW:         1.10 cm   LV E/e' medial:  7.6 LV IVS:        1.00 cm   LV e' lateral:   14.20 cm/s LVOT diam:     2.20 cm   LV E/e' lateral: 6.1 LV SV:         67 LV SV Index:   37 LVOT Area:     3.80 cm  RIGHT VENTRICLE RV S prime:     22.20 cm/s TAPSE (M-mode): 2.5 cm LEFT ATRIUM           Index        RIGHT ATRIUM           Index LA diam:      3.30 cm 1.81 cm/m   RA Area:     17.10 cm LA Vol (A2C): 35.5 ml 19.52 ml/m  RA Volume:   46.20 ml  25.41 ml/m LA Vol (A4C): 61.8 ml 33.98 ml/m  AORTIC VALVE LVOT Vmax:   81.30 cm/s LVOT Vmean:  44.100 cm/s LVOT VTI:    0.175 m  AORTA Ao Root diam: 3.60 cm MITRAL VALVE MV Area (PHT): 3.03 cm    SHUNTS MV Decel Time: 250 msec    Systemic VTI:  0.18 m MV E velocity: 86.20 cm/s  Systemic Diam: 2.20 cm MV A velocity: 54.60 cm/s MV E/A ratio:  1.58 Vishnu Priya Mallipeddi Electronically signed by Diannah Late Mallipeddi Signature Date/Time: 03/16/2024/9:42:16 AM    Final    MR Lumbar Spine W Wo Contrast Result Date:  03/15/2024 CLINICAL DATA:  Back pain and weakness EXAM: MRI LUMBAR SPINE WITHOUT AND WITH CONTRAST TECHNIQUE: Multiplanar and multiecho pulse sequences of the lumbar spine were obtained without and with intravenous contrast. CONTRAST:  6mL GADAVIST GADOBUTROL 1 MMOL/ML IV SOLN COMPARISON:  January 16, 2014 FINDINGS: The bone marrow signal is diffusely abnormal with lower than normal signal on the T1 weighted image. There is loss of disc height at L3-L4 with mild edema and enhancement in the vertebral endplates on the left with paraspinal inflammation. There is edema and enhancement of the right-side of the vertebral endplates at L5-S1 with  paraspinal inflammation. The right facet joint at L5-S1 is inflamed with enhancement in the adjacent soft tissues and a small abscess posterior to the facet joint. There are bilateral small psoas abscesses. There is no epidural abscess. There is no spinal stenosis. There is pseudoarthrosis/partial fusion of the right transverse process of L5 and the right-side of the sacrum with a chronic signal abnormality in the bone in this location. IMPRESSION: There is discitis/osteomyelitis at L3-L4 and L5-S1 with a septic right facet joint at L5-S1. There is paraspinal inflammation with small bilateral psoas abscesses. The bone marrow signal is diffusely abnormal, possibly due to anemia. A diffuse infiltrative process is possible but considered less likely. Electronically Signed   By: Nancyann Burns M.D.   On: 03/15/2024 11:39    SIGNED: Adriana DELENA Grams, MD, FHM. FAAFP. Jolynn Pack - [facility] Time spent - 55 min.  In seeing, evaluating and examining the patient. Reviewing medical records, labs, drawn plan of care. [facility],  Pager (please use [email or web address] to page/ text) Please use Epic Secure Chat for non-urgent communication (7AM-7PM)  If 7PM-7AM, please contact night-coverage [email or web address], 03/16/2024, 9:57 AM

## 2024-03-16 NOTE — Progress Notes (Signed)
 Informed by night shift nurse that patient removed IV. Night shift nurses attempted three times to place IV and unable to place new IV. Dayshift nurses attempted twice and unable to place IV. MD Shahmehdi made aware.

## 2024-03-16 NOTE — Consult Note (Signed)
 WOC team consulted for sacral wound. Please see consult note 03/15/2024 with orders in place for wound care.    WOC team will not follow.  Please reconsult if further needs arise.   Thank you,    Powell Bar MSN, RN-BC, [employer]

## 2024-03-17 DIAGNOSIS — D649 Anemia, unspecified: Secondary | ICD-10-CM

## 2024-03-17 DIAGNOSIS — B9561 Methicillin susceptible Staphylococcus aureus infection as the cause of diseases classified elsewhere: Secondary | ICD-10-CM

## 2024-03-17 LAB — CBC WITH DIFFERENTIAL/PLATELET
Abs Immature Granulocytes: 0.19 K/uL — ABNORMAL HIGH (ref 0.00–0.07)
Basophils Absolute: 0 K/uL (ref 0.0–0.1)
Basophils Relative: 0 %
Eosinophils Absolute: 0 K/uL (ref 0.0–0.5)
Eosinophils Relative: 0 %
HCT: 24.2 % — ABNORMAL LOW (ref 39.0–52.0)
Hemoglobin: 7.7 g/dL — ABNORMAL LOW (ref 13.0–17.0)
Immature Granulocytes: 2 %
Lymphocytes Relative: 8 %
Lymphs Abs: 0.8 K/uL (ref 0.7–4.0)
MCH: 29.4 pg (ref 26.0–34.0)
MCHC: 31.8 g/dL (ref 30.0–36.0)
MCV: 92.4 fL (ref 80.0–100.0)
Monocytes Absolute: 0.6 K/uL (ref 0.1–1.0)
Monocytes Relative: 6 %
Neutro Abs: 8.4 K/uL — ABNORMAL HIGH (ref 1.7–7.7)
Neutrophils Relative %: 84 %
Platelets: 162 K/uL (ref 150–400)
RBC: 2.62 MIL/uL — ABNORMAL LOW (ref 4.22–5.81)
RDW: 14.3 % (ref 11.5–15.5)
WBC: 10 K/uL (ref 4.0–10.5)
nRBC: 0 % (ref 0.0–0.2)

## 2024-03-17 LAB — FERRITIN: Ferritin: 839 ng/mL — ABNORMAL HIGH (ref 24–336)

## 2024-03-17 LAB — COMPREHENSIVE METABOLIC PANEL WITH GFR
ALT: 21 U/L (ref 0–44)
AST: 53 U/L — ABNORMAL HIGH (ref 15–41)
Albumin: 2.2 g/dL — ABNORMAL LOW (ref 3.5–5.0)
Alkaline Phosphatase: 306 U/L — ABNORMAL HIGH (ref 38–126)
Anion gap: 8 (ref 5–15)
BUN: 35 mg/dL — ABNORMAL HIGH (ref 6–20)
CO2: 21 mmol/L — ABNORMAL LOW (ref 22–32)
Calcium: 8.1 mg/dL — ABNORMAL LOW (ref 8.9–10.3)
Chloride: 107 mmol/L (ref 98–111)
Creatinine, Ser: 0.94 mg/dL (ref 0.61–1.24)
GFR, Estimated: >60 mL/min (ref >60)
Glucose, Bld: 101 mg/dL — ABNORMAL HIGH (ref 70–99)
Potassium: 4.3 mmol/L (ref 3.5–5.1)
Sodium: 137 mmol/L (ref 135–145)
Total Bilirubin: 0.9 mg/dL (ref 0.0–1.2)
Total Protein: 6.1 g/dL — ABNORMAL LOW (ref 6.5–8.1)

## 2024-03-17 LAB — IRON AND TIBC
Iron: 24 ug/dL — ABNORMAL LOW (ref 45–182)
Saturation Ratios: 17 % — ABNORMAL LOW (ref 17.9–39.5)
TIBC: 141 ug/dL — ABNORMAL LOW (ref 250–450)
UIBC: 117 ug/dL

## 2024-03-17 LAB — RETICULOCYTES
Immature Retic Fract: 14.7 % (ref 2.3–15.9)
RBC.: 3.14 MIL/uL — ABNORMAL LOW (ref 4.22–5.81)
Retic Count, Absolute: 46.5 K/uL (ref 19.0–186.0)
Retic Ct Pct: 1.5 % (ref 0.4–3.1)

## 2024-03-17 LAB — VITAMIN B12: Vitamin B-12: 591 pg/mL (ref 180–914)

## 2024-03-17 LAB — FOLATE: Folate: 4.3 ng/mL — ABNORMAL LOW (ref >5.9)

## 2024-03-17 MED ORDER — HYDROMORPHONE HCL 1 MG/ML IJ SOLN
0.5000 mg | INTRAMUSCULAR | Status: DC | PRN
  Administered 2024-03-17 – 2024-03-25 (×45): 0.5 mg via INTRAVENOUS
  Filled 2024-03-17 (×46): qty 0.5

## 2024-03-17 MED ORDER — OXYCODONE-ACETAMINOPHEN 5-325 MG PO TABS
1.0000 | ORAL_TABLET | ORAL | Status: DC | PRN
  Administered 2024-03-17 – 2024-03-25 (×15): 1 via ORAL
  Filled 2024-03-17 (×16): qty 1

## 2024-03-17 MED ORDER — IRON SUCROSE 200 MG IVPB - SIMPLE MED
200.0000 mg | Status: DC
  Administered 2024-03-17: 200 mg via INTRAVENOUS
  Filled 2024-03-17 (×2): qty 110

## 2024-03-17 MED ORDER — FOLIC ACID 1 MG PO TABS
1.0000 mg | ORAL_TABLET | Freq: Every day | ORAL | Status: DC
  Administered 2024-03-17 – 2024-03-25 (×9): 1 mg via ORAL
  Filled 2024-03-17 (×9): qty 1

## 2024-03-17 NOTE — Progress Notes (Signed)
 Progress Note   Patient: [name] [name] FMW:[id number] DOB: [birth year] DOA: 03/14/2024     2 DOS: the patient was seen and examined on 03/17/2024   Brief hospital course: [name] [name] is a 48 y.o. male with medical history significant of anxiety disorder, tobacco abuse, chronic back pain who presents to the emergency department from home due to generalized weakness and poor oral intake. Patient complained of hurting his back about 5 weeks ago and he has been laying in bed most of the time for about a month.  He believed that he aggravated the back from sleeping in an uncomfortable position.  He denied any falls. For about a week now, wife at bedside reports poor oral intake but has been tolerating liquids without any difficulty.  He denies dysphagia.  But having productive cough of rusty colored sputum and subjective fever within the last few days. Patient denies any IV drug use in the last 6 months, alcohol use in past few months and cigarettes use in the past few days.   ED Course:  Patient was tachypneic, tachycardic, but other vital signs are within normal range.  Workup in the ED showed normocytic anemia, thrombocytopenia.  BMP shows sodium 133, potassium 4.8, chloride 98, bicarb 21, blood glucose 129, BUN 46, creatinine 1.45 (last lab for comparison was creatinine of 0.88 and this was 7 years ago), albumin 2.7, AST 48, ALT 23, ALP 362, total bilirubin 1.6, lactic acid 2.5 > 1.7.  TSH 1.730, ammonia 28.  Blood culture pending CT abdomen pelvis showed small cavitary lung base nodules, most likely due to multifocal septic lung emboli, most of them continue fluid compatible with these being abscesses.  Small pericardial effusion. Chest x-ray showed patchy nodular airspace opacities in both lungs, concerning for infection Patient was treated with IV ceftriaxone  and azithromycin  were given for pneumonia, cefepime and vancomycin  were given for osteomyelitis of the spine. morphine  was given, and  Zofran .  Principal Problem:   Sepsis due to pneumonia [facility]) Active Problems:   Septic embolism (HCC)   Pressure injury of skin   Lactic acidosis   Osteomyelitis of lumbar spine (HCC)   Thrombocytopenia   Transaminasemia   Hypoalbuminemia   Failure to thrive in adult  Assessment and Plan: Sepsis due to pneumonia/ small Septic embolism  Staph aureus bacteremia Heart rate elevated, blood pressure is improved. Otherwise hemodynamically stable Lactic acid improved. Blood cultures grew Staph aureus MSSA.  Vancomycin , cefepime stopped. Patient is started on Ancef  therapy as per ID.  Repeat blood cultures ordered. Echo reviewed is unremarkable for valvular vegetation.  POA: Patient met sepsis criteria due to being tachycardic, tachypneic -   Chest x-ray was suggestive of infection and CT abdomen and pelvis was suggestive of septic embolism  IV hydration per sepsis protocol was provided Was started on IV ceftriaxone  and azithromycin  in ED which were broadened to cefepime and vancomycin  therapy after consulting ID, reviewing blood cultures.  Antibiotics tapered to Ancef  therapy per culture results.   Osteomyelitis of the lumbar spine CT abdomen and pelvis was suggestive of discitis/osteomyelitis of posttraumatic change. MRI of lumbar spine: There is discitis/osteomyelitis at L3-L4 and L5-S1 with a septic right facet joint at L5-S1. There is paraspinal inflammation with small bilateral psoas abscesses. The bone marrow signal is diffusely abnormal, possibly due to anemia.  Patient was empirically started on IV vancomycin  and Cefepime. Neurosurgery recommended no surgical intervention. ID evaluation appreciated, deferred disc aspiration at this time. Cultures grew Staph aureus.  Cultures reviewed,  antibiotics tapered to Ancef  therapy.  Repeat cultures so far negative.  Will follow ID regarding final antibiotic recommendations. Continue IV Dilaudid  0.5 mg every 3 hours as needed for severe pain.   Oxycodone  5 mg Q4 hourly for moderate pain ordered. Continue fall precaution PT OT follow-up.   Thrombocytopenia Transaminitis In the setting of hepatic steatosis. Platelet count, LFTs improved.  Acute on chronic anemia: Patient's hemoglobin dropped to 7.7. Anemia panel ordered. Iron levels low, folate levels low noted. Will supplement iron and folate acid.  Hypoalbuminemia possibly due to moderate protein calorie malnutrition  Failure to thrive in adult Albumin 2.7, protein supplement will be provided Diet advanced to soft diet. Dietitian consultation.   Pressure ulcer to sacrum-stage II WOC consult for dressing recommendations.      Out of bed to chair. Incentive spirometry. Nursing supportive care. Fall, aspiration precautions. Diet:  Diet Orders (From admission, onward)     Start     Ordered   03/17/24 1338  DIET SOFT Room service appropriate? Yes; Fluid consistency: Thin  Diet effective now       Question Answer Comment  Room service appropriate? Yes   Fluid consistency: Thin      03/17/24 1337           DVT prophylaxis: SCDs Start: 03/15/24 0330  Level of care: Telemetry   Code Status: Full Code  Subjective: Patient is seen and examined today morning.  He is weak, able to answer appropriately.  Did not get out of bed.  Eating poor.  RN at bedside asked for oral pain medication.  Physical Exam: Vitals:   03/16/24 1328 03/16/24 1939 03/17/24 0450 03/17/24 1244  BP: 136/89 125/79 132/84 130/76  Pulse: (!) 109 100 (!) 110 100  Resp: 18 19 18 18   Temp: 98.3 F (36.8 C) 98.4 F (36.9 C) 98.3 F (36.8 C) 97.9 F (36.6 C)  TempSrc: Oral Oral Oral Axillary  SpO2: 94% 97% 96% 97%  Weight:      Height:        General - Young Caucasian ill looking male, no apparent distress HEENT - PERRLA, EOMI, atraumatic head, non tender sinuses. Lung - Clear, basal rales, no rhonchi, wheezes. Heart - S1, S2 heard, no murmurs, rubs, trace pedal edema. Abdomen -  Soft, non tender, bowel sounds good Neuro - Alert, awake able to answer, follow simple commands, non focal exam. Skin - Warm and dry.  Data Reviewed:      Latest Ref Rng & Units 03/17/2024    4:26 AM 03/16/2024    4:36 AM 03/15/2024    4:11 AM  CBC  WBC 4.0 - 10.5 K/uL 10.0  11.1  6.6   Hemoglobin 13.0 - 17.0 g/dL 7.7  9.2  8.6   Hematocrit 39.0 - 52.0 % 24.2  28.8  27.4   Platelets 150 - 400 K/uL 162  131  93       Latest Ref Rng & Units 03/17/2024    4:26 AM 03/16/2024    4:36 AM 03/15/2024    4:11 AM  BMP  Glucose 70 - 99 mg/dL 898  885  890   BUN 6 - 20 mg/dL 35  40  40   Creatinine 0.61 - 1.24 mg/dL 9.05  8.95  8.79   Sodium 135 - 145 mmol/L 137  136  136   Potassium 3.5 - 5.1 mmol/L 4.3  4.4  4.5   Chloride 98 - 111 mmol/L 107  107  104  CO2 22 - 32 mmol/L 21  21  22    Calcium 8.9 - 10.3 mg/dL 8.1  8.4  8.1    ECHOCARDIOGRAM COMPLETE Result Date: 03/16/2024    ECHOCARDIOGRAM REPORT   Patient Name:   [name] [name] [facility] Date of Exam: 03/15/2024 Medical Rec #:  [id number]        Height:       72.0 in Accession #:    [id number]       Weight:       137.8 lb Date of Birth:  29-May-1976        BSA:          1.819 m Patient Age:    48 years         BP:           100/66 mmHg Patient Gender: M                HR:           84 bpm. Exam Location:  [facility] Procedure: 2D Echo, Cardiac Doppler and Color Doppler (Both Spectral and Color            Flow Doppler were utilized during procedure). Indications:    Endocarditis l38  History:        Patient has prior history of Echocardiogram examinations, most                 recent 01/21/2014. Risk Factors:Current Smoker and Hypertension.                 Sepsis due to pneumonia [facility]), Septic embolism (HCC).  Sonographer:    Aida Pizza RCS Referring Phys: [id number] TRUNG T VU IMPRESSIONS  1. Left ventricular ejection fraction, by estimation, is 60 to 65%. The left ventricle has normal function. The left ventricle has no regional wall motion abnormalities.  Left ventricular diastolic parameters were normal.  2. Right ventricular systolic function is normal. The right ventricular size is normal. Tricuspid regurgitation signal is inadequate for assessing PA pressure.  3. Left atrial size was mildly dilated.  4. The mitral valve is normal in structure. No evidence of mitral valve regurgitation. No evidence of mitral stenosis.  5. The aortic valve is tricuspid. Aortic valve regurgitation is not visualized. No aortic stenosis is present.  6. The inferior vena cava is normal in size with greater than 50% respiratory variability, suggesting right atrial pressure of 3 mmHg. Comparison(s): No prior Echocardiogram. Conclusion(s)/Recommendation(s): No evidence of valvular vegetations on this transthoracic echocardiogram. Consider a transesophageal echocardiogram to exclude infective endocarditis if clinically indicated. FINDINGS  Left Ventricle: Left ventricular ejection fraction, by estimation, is 60 to 65%. The left ventricle has normal function. The left ventricle has no regional wall motion abnormalities. Strain was performed and the global longitudinal strain is indeterminate. The left ventricular internal cavity size was normal in size. There is no left ventricular hypertrophy. Left ventricular diastolic parameters were normal. Right Ventricle: The right ventricular size is normal. No increase in right ventricular wall thickness. Right ventricular systolic function is normal. Tricuspid regurgitation signal is inadequate for assessing PA pressure. Left Atrium: Left atrial size was mildly dilated. Right Atrium: Right atrial size was normal in size. Pericardium: There is no evidence of pericardial effusion. Mitral Valve: The mitral valve is normal in structure. No evidence of mitral valve regurgitation. No evidence of mitral valve stenosis. Tricuspid Valve: The tricuspid valve is normal in structure. Tricuspid valve regurgitation is trivial. No evidence of tricuspid stenosis.  Aortic Valve: The aortic valve is tricuspid. Aortic valve regurgitation is not visualized. No aortic stenosis is present. Pulmonic Valve: The pulmonic valve was not well visualized. Pulmonic valve regurgitation is not visualized. No evidence of pulmonic stenosis. Aorta: The aortic root is normal in size and structure. Venous: The inferior vena cava is normal in size with greater than 50% respiratory variability, suggesting right atrial pressure of 3 mmHg. IAS/Shunts: No atrial level shunt detected by color flow Doppler. Additional Comments: 3D was performed not requiring image post processing on an independent workstation and was indeterminate.  LEFT VENTRICLE PLAX 2D LVIDd:         5.30 cm   Diastology LVIDs:         3.70 cm   LV e' medial:    11.30 cm/s LV PW:         1.10 cm   LV E/e' medial:  7.6 LV IVS:        1.00 cm   LV e' lateral:   14.20 cm/s LVOT diam:     2.20 cm   LV E/e' lateral: 6.1 LV SV:         67 LV SV Index:   37 LVOT Area:     3.80 cm  RIGHT VENTRICLE RV S prime:     22.20 cm/s TAPSE (M-mode): 2.5 cm LEFT ATRIUM           Index        RIGHT ATRIUM           Index LA diam:      3.30 cm 1.81 cm/m   RA Area:     17.10 cm LA Vol (A2C): 35.5 ml 19.52 ml/m  RA Volume:   46.20 ml  25.41 ml/m LA Vol (A4C): 61.8 ml 33.98 ml/m  AORTIC VALVE LVOT Vmax:   81.30 cm/s LVOT Vmean:  44.100 cm/s LVOT VTI:    0.175 m  AORTA Ao Root diam: 3.60 cm MITRAL VALVE MV Area (PHT): 3.03 cm    SHUNTS MV Decel Time: 250 msec    Systemic VTI:  0.18 m MV E velocity: 86.20 cm/s  Systemic Diam: 2.20 cm MV A velocity: 54.60 cm/s MV E/A ratio:  1.58 Vishnu Priya Mallipeddi Electronically signed by Diannah Late Mallipeddi Signature Date/Time: 03/16/2024/9:42:16 AM    Final     Family Communication: Discussed with patient, understand and agree. All questions answered.  Disposition: Status is: Inpatient Remains inpatient appropriate because: IV antibiotics for bacteremia, osteomyelitis  Planned Discharge Destination:  Home with Home Health     Time spent: 44 minutes  Author: Concepcion Riser, MD 03/17/2024 4:26 PM Secure chat 7am to 7pm For on call review [email or web address].

## 2024-03-17 NOTE — Progress Notes (Signed)
 Patient educated on the importance of mobility, this Clinical research associate encouraged patient to ambulate. Patient stated he did not want to get up at this time due to discomfort.

## 2024-03-17 NOTE — Progress Notes (Signed)
 This Clinical research associate called to patients room by patients wife. Patient spouse stated patient wants to leave. Patient advised about leaving against medical advice. MD Sreeram made aware. This Clinical research associate spoke with patient with family at bedside. Patient decided not to leave AMA.

## 2024-03-17 NOTE — Progress Notes (Signed)
 Throughout the shift patient removed his oxygen, patient educated several times regarding the importance of wearing his nasal cannula. Patient requested pain medication but could barely keep his eyes open, falling asleep mid sentence. Pain medication not given.

## 2024-03-17 NOTE — Progress Notes (Signed)
Wound care done. Patient tolerated well.

## 2024-03-17 NOTE — Progress Notes (Signed)
 Patient ripped his malewick off during this shift then proceeded to pee over the side of the bed rail.

## 2024-03-17 NOTE — Plan of Care (Signed)
   Problem: Education: Goal: Knowledge of General Education information will improve Description: Including pain rating scale, medication(s)/side effects and non-pharmacologic comfort measures Outcome: Progressing   Problem: Clinical Measurements: Goal: Ability to maintain clinical measurements within normal limits will improve Outcome: Progressing   Problem: Activity: Goal: Risk for activity intolerance will decrease Outcome: Progressing   Problem: Pain Managment: Goal: General experience of comfort will improve and/or be controlled Outcome: Progressing   Problem: Skin Integrity: Goal: Risk for impaired skin integrity will decrease Outcome: Progressing

## 2024-03-18 DIAGNOSIS — R7881 Bacteremia: Secondary | ICD-10-CM

## 2024-03-18 LAB — COMPREHENSIVE METABOLIC PANEL WITH GFR
ALT: 24 U/L (ref 0–44)
AST: 68 U/L — ABNORMAL HIGH (ref 15–41)
Albumin: 2.2 g/dL — ABNORMAL LOW (ref 3.5–5.0)
Alkaline Phosphatase: 318 U/L — ABNORMAL HIGH (ref 38–126)
Anion gap: 9 (ref 5–15)
BUN: 33 mg/dL — ABNORMAL HIGH (ref 6–20)
CO2: 22 mmol/L (ref 22–32)
Calcium: 8.3 mg/dL — ABNORMAL LOW (ref 8.9–10.3)
Chloride: 106 mmol/L (ref 98–111)
Creatinine, Ser: 1 mg/dL (ref 0.61–1.24)
GFR, Estimated: >60 mL/min (ref >60)
Glucose, Bld: 99 mg/dL (ref 70–99)
Potassium: 4.6 mmol/L (ref 3.5–5.1)
Sodium: 138 mmol/L (ref 135–145)
Total Bilirubin: 0.6 mg/dL (ref 0.0–1.2)
Total Protein: 6.5 g/dL (ref 6.5–8.1)

## 2024-03-18 LAB — CBC WITH DIFFERENTIAL/PLATELET
Abs Immature Granulocytes: 0.33 K/uL — ABNORMAL HIGH (ref 0.00–0.07)
Basophils Absolute: 0 K/uL (ref 0.0–0.1)
Basophils Relative: 0 %
Eosinophils Absolute: 0 K/uL (ref 0.0–0.5)
Eosinophils Relative: 0 %
HCT: 26.5 % — ABNORMAL LOW (ref 39.0–52.0)
Hemoglobin: 8.3 g/dL — ABNORMAL LOW (ref 13.0–17.0)
Immature Granulocytes: 3 %
Lymphocytes Relative: 7 %
Lymphs Abs: 0.7 K/uL (ref 0.7–4.0)
MCH: 29.5 pg (ref 26.0–34.0)
MCHC: 31.3 g/dL (ref 30.0–36.0)
MCV: 94.3 fL (ref 80.0–100.0)
Monocytes Absolute: 0.6 K/uL (ref 0.1–1.0)
Monocytes Relative: 6 %
Neutro Abs: 8.1 K/uL — ABNORMAL HIGH (ref 1.7–7.7)
Neutrophils Relative %: 84 %
Platelets: 213 K/uL (ref 150–400)
RBC: 2.81 MIL/uL — ABNORMAL LOW (ref 4.22–5.81)
RDW: 14.2 % (ref 11.5–15.5)
WBC: 9.7 K/uL (ref 4.0–10.5)
nRBC: 0 % (ref 0.0–0.2)

## 2024-03-18 LAB — CULTURE, BLOOD (ROUTINE X 2)

## 2024-03-18 MED ORDER — FERROUS SULFATE 325 (65 FE) MG PO TABS
325.0000 mg | ORAL_TABLET | Freq: Every day | ORAL | Status: DC
  Administered 2024-03-22 – 2024-03-25 (×4): 325 mg via ORAL
  Filled 2024-03-18 (×4): qty 1

## 2024-03-18 NOTE — Progress Notes (Signed)
 Progress Note   Patient: [name] [name] [name] [name]:[id number] DOB: 17-Feb-1976 DOA: 03/14/2024     3 DOS: the patient was seen and examined on 03/18/2024   Brief hospital course: [name] [name] is a 48 y.o. male with medical history significant of anxiety disorder, tobacco abuse, chronic back pain who presents to the emergency department from home due to generalized weakness and poor oral intake. Patient complained of hurting his back about 5 weeks ago and he has been laying in bed most of the time for about a month.  He believed that he aggravated the back from sleeping in an uncomfortable position.  He denied any falls. For about a week now, wife at bedside reports poor oral intake but has been tolerating liquids without any difficulty.  He denies dysphagia.  But having productive cough of rusty colored sputum and subjective fever within the last few days. Patient denies any IV drug use in the last 6 months, alcohol use in past few months and cigarettes use in the past few days.   ED Course:  Patient was tachypneic, tachycardic, but other vital signs are within normal range.  Workup in the ED showed normocytic anemia, thrombocytopenia.  BMP shows sodium 133, potassium 4.8, chloride 98, bicarb 21, blood glucose 129, BUN 46, creatinine 1.45 (last lab for comparison was creatinine of 0.88 and this was 7 years ago), albumin 2.7, AST 48, ALT 23, ALP 362, total bilirubin 1.6, lactic acid 2.5 > 1.7.  TSH 1.730, ammonia 28.  Blood culture pending CT abdomen pelvis showed small cavitary lung base nodules, most likely due to multifocal septic lung emboli, most of them continue fluid compatible with these being abscesses.  Small pericardial effusion. Chest x-ray showed patchy nodular airspace opacities in both lungs, concerning for infection Patient was treated with IV ceftriaxone  and azithromycin  were given for pneumonia, cefepime and vancomycin  were given for osteomyelitis of the spine. morphine  was given, and  Zofran .   Principal Problem:   Sepsis due to pneumonia [facility]) Active Problems:   Septic embolism (HCC)   Pressure injury of skin   Lactic acidosis   Osteomyelitis of lumbar spine (HCC)   Thrombocytopenia   Transaminasemia   Hypoalbuminemia   Failure to thrive in adult  Assessment and Plan: Sepsis due to pneumonia/ small Septic embolism  Staph aureus bacteremia Heart rate, blood pressure is improved. Lactic acid improved. Blood cultures grew Staph aureus MSSA.  Vancomycin , cefepime stopped. Continue Ancef  therapy as per ID for 2 more days until repeat blood cultures are resulted when ID can give final recommendations. Echo reviewed is unremarkable for valvular vegetation.  POA: Patient met sepsis criteria due to being tachycardic, tachypneic -   Chest x-ray was suggestive of infection and CT abdomen and pelvis was suggestive of septic embolism  IV hydration per sepsis protocol was provided Was started on IV ceftriaxone  and azithromycin  in ED which were broadened to cefepime and vancomycin  therapy after consulting ID, reviewing blood cultures.  Antibiotics tapered to Ancef  therapy per culture results.   Osteomyelitis of the lumbar spine CT abdomen and pelvis was suggestive of discitis/osteomyelitis of posttraumatic change. MRI of lumbar spine: There is discitis/osteomyelitis at L3-L4 and L5-S1 with a septic right facet joint at L5-S1. There is paraspinal inflammation with small bilateral psoas abscesses. The bone marrow signal is diffusely abnormal, possibly due to anemia.  Patient was empirically started on IV vancomycin  and Cefepime. Neurosurgery recommended no surgical intervention. ID evaluation appreciated, deferred disc aspiration at this time. Cultures grew  Staph aureus.  Cultures reviewed, antibiotics tapered to Ancef  therapy.  Repeat cultures so far negative.  Will follow ID regarding final antibiotic recommendations. Continue IV Dilaudid  0.5 mg every 3 hours as needed for  severe pain.  Oxycodone  5 mg Q4 hourly for moderate pain ordered. Continue fall precaution PT OT follow-up.   Thrombocytopenia Transaminitis In the setting of hepatic steatosis. Platelet count, LFTs improved.  Acute on chronic anemia: Patient's hemoglobin dropped to 7.7. Anemia panel ordered. Iron levels low, folate levels low noted. Will supplement iron and folate acid.  Hypoalbuminemia possibly due to moderate protein calorie malnutrition  Failure to thrive in adult Albumin 2.7, protein supplement will be provided Diet advanced to soft diet. Dietitian consultation.   Pressure ulcer to sacrum-stage II WOC dressing recommendations ordered.      Out of bed to chair. Incentive spirometry. Nursing supportive care. Fall, aspiration precautions. Diet:  Diet Orders (From admission, onward)     Start     Ordered   03/17/24 1338  DIET SOFT Room service appropriate? Yes; Fluid consistency: Thin  Diet effective now       Question Answer Comment  Room service appropriate? Yes   Fluid consistency: Thin      03/17/24 1337           DVT prophylaxis: SCDs Start: 03/15/24 0330  Level of care: Telemetry   Code Status: Full Code  Subjective: Patient is seen and examined today morning.  He is weak, more alert. Asks for pain meds. Advised to work with PT.  Physical Exam: Vitals:   03/17/24 2037 03/18/24 0544 03/18/24 0544 03/18/24 1348  BP: 108/70 131/89 131/89 110/71  Pulse: 100 81 81   Resp: 18 20 20    Temp: 98.7 F (37.1 C) 98.6 F (37 C) 98.6 F (37 C) 99.8 F (37.7 C)  TempSrc: Oral Oral Oral Oral  SpO2: 96% 98% 98% 97%  Weight:      Height:        General - Young Caucasian weak male, no apparent distress HEENT - PERRLA, EOMI, atraumatic head, non tender sinuses. Lung - Clear, basal rales, no rhonchi, wheezes. Heart - S1, S2 heard, no murmurs, rubs, trace pedal edema. Abdomen - Soft, non tender, bowel sounds good Neuro - Alert, awake able to answer,  follow simple commands, non focal exam. Skin - Warm and dry.  Data Reviewed:      Latest Ref Rng & Units 03/18/2024    4:19 AM 03/17/2024    4:26 AM 03/16/2024    4:36 AM  CBC  WBC 4.0 - 10.5 K/uL 9.7  10.0  11.1   Hemoglobin 13.0 - 17.0 g/dL 8.3  7.7  9.2   Hematocrit 39.0 - 52.0 % 26.5  24.2  28.8   Platelets 150 - 400 K/uL 213  162  131       Latest Ref Rng & Units 03/18/2024    4:19 AM 03/17/2024    4:26 AM 03/16/2024    4:36 AM  BMP  Glucose 70 - 99 mg/dL 99  898  885   BUN 6 - 20 mg/dL 33  35  40   Creatinine 0.61 - 1.24 mg/dL 8.99  9.05  8.95   Sodium 135 - 145 mmol/L 138  137  136   Potassium 3.5 - 5.1 mmol/L 4.6  4.3  4.4   Chloride 98 - 111 mmol/L 106  107  107   CO2 22 - 32 mmol/L 22  21  21  Calcium 8.9 - 10.3 mg/dL 8.3  8.1  8.4    No results found.   Family Communication: Discussed with patient, understand and agree. All questions answered.  Disposition: Status is: Inpatient Remains inpatient appropriate because: IV antibiotics for bacteremia, osteomyelitis  Planned Discharge Destination: Home with Home Health     Time spent: 46 minutes  Author: Concepcion Riser, MD 03/18/2024 4:22 PM Secure chat 7am to 7pm For on call review [email or web address].

## 2024-03-18 NOTE — Plan of Care (Signed)
   Problem: Activity: Goal: Risk for activity intolerance will decrease Outcome: Progressing   Problem: Coping: Goal: Level of anxiety will decrease Outcome: Progressing

## 2024-03-18 NOTE — Plan of Care (Signed)

## 2024-03-18 NOTE — Progress Notes (Signed)
 Mobility Specialist Progress Note:    03/18/24 1400  Mobility  Activity Ambulated with assistance  Level of Assistance Standby assist, set-up cues, supervision of patient - no hands on  Assistive Device Front wheel walker  Distance Ambulated (ft) 100 ft  Range of Motion/Exercises Active;All extremities  Activity Response Tolerated well  Mobility Referral Yes  Mobility visit 1 Mobility  Mobility Specialist Start Time (ACUTE ONLY) 1400  Mobility Specialist Stop Time (ACUTE ONLY) 1420  Mobility Specialist Time Calculation (min) (ACUTE ONLY) 20 min   Pt received in bed, agreeable to mobility. Required supervision to stand and ambulate with RW. Tolerated well, SpO2 97% on RA at rest. SpO2 94-98% on RA during ambulation, HR max 140. Left sitting EOB, alarm on. All needs met.  Haillie Radu Mobility Specialist Please contact via Special educational needs teacher or  Rehab office at [phone]

## 2024-03-19 DIAGNOSIS — M00812 Arthritis due to other bacteria, left shoulder: Secondary | ICD-10-CM

## 2024-03-19 DIAGNOSIS — M4637 Infection of intervertebral disc (pyogenic), lumbosacral region: Secondary | ICD-10-CM

## 2024-03-19 DIAGNOSIS — B9681 Helicobacter pylori [H. pylori] as the cause of diseases classified elsewhere: Secondary | ICD-10-CM

## 2024-03-19 DIAGNOSIS — K6812 Psoas muscle abscess: Secondary | ICD-10-CM

## 2024-03-19 LAB — CULTURE, BLOOD (ROUTINE X 2)
Culture: NO GROWTH
Special Requests: ADEQUATE

## 2024-03-19 MED ORDER — ALUM & MAG HYDROXIDE-SIMETH 200-200-20 MG/5ML PO SUSP: 15.0000 mL | Freq: Four times a day (QID) | ORAL | Status: DC | PRN

## 2024-03-19 MED ORDER — HALOPERIDOL LACTATE 5 MG/ML IJ SOLN: 1.0000 mg | Freq: Four times a day (QID) | INTRAMUSCULAR | Status: DC | PRN

## 2024-03-19 MED ORDER — POLYETHYLENE GLYCOL 3350 17 G PO PACK
17.0000 g | PACK | Freq: Every day | ORAL | Status: DC
  Administered 2024-03-19 – 2024-03-25 (×4): 17 g via ORAL
  Filled 2024-03-19 (×7): qty 1

## 2024-03-19 MED ORDER — PANTOPRAZOLE SODIUM 40 MG PO TBEC
40.0000 mg | DELAYED_RELEASE_TABLET | Freq: Every day | ORAL | Status: DC
  Administered 2024-03-19 – 2024-03-25 (×7): 40 mg via ORAL
  Filled 2024-03-19 (×7): qty 1

## 2024-03-19 NOTE — Plan of Care (Signed)

## 2024-03-19 NOTE — Progress Notes (Signed)
 Physical Therapy Treatment Patient Details Name: [name] BUMGARNER MRN: [id number] DOB: [birth year] Today's Date: 03/19/2024   History of Present Illness [name] [name] is a 48 y.o. male with medical history significant of anxiety disorder, tobacco abuse, chronic back pain who presents to the emergency department from home due to generalized weakness and poor oral intake.  Patient complained of hurting his back about 5 weeks ago and he has been laying in bed most of the time for about a month.  He believed that he aggravated the back from sleeping in an uncomfortable position.  He denied any falls.  About a week ago, wife at bedside reports poor oral intake but has been tolerating liquids without any difficulty.  He denies dysphagia.  Patient has also been having productive cough of rusty colored sputum and subjective fever within the last few days.  Patient denies any IV drug use in the last 6 months, alcohol use in past few months and cigarettes use in the past few days.    PT Comments  Patient agreeable for therapy.  Patient demonstrates fair/good return for completing BLE ROM/strengthening exercises seated at bedside, occasional rest breaks mostly due to c/o back pain, increased endurance/distance for gait training with hand held assist and able to walk without hand held or and AD in room. Patient tolerated sitting up at bedside after therapy with family members present. Patient will benefit from continued skilled physical therapy in hospital and recommended venue below to increase strength, balance, endurance for safe ADLs and gait.       If plan is discharge home, recommend the following: A little help with walking and/or transfers;A little help with bathing/dressing/bathroom;Assist for transportation;Assistance with cooking/housework;Help with stairs or ramp for entrance   Can travel by private vehicle        Equipment Recommendations  None recommended by PT    Recommendations for Other  Services       Precautions / Restrictions Precautions Precautions: Fall Recall of Precautions/Restrictions: Intact Restrictions Weight Bearing Restrictions Per Provider Order: No     Mobility  Bed Mobility Overal bed mobility: Needs Assistance Bed Mobility: Supine to Sit Rolling: Contact guard assist, Min assist         General bed mobility comments: labored movement with bed flat    Transfers Overall transfer level: Needs assistance Equipment used: 1 person hand held assist Transfers: Sit to/from Stand, Bed to chair/wheelchair/BSC Sit to Stand: Contact guard assist   Step pivot transfers: Contact guard assist       General transfer comment: slightly unsteady    Ambulation/Gait Ambulation/Gait assistance: Supervision, Contact guard assist Gait Distance (Feet): 135 Feet Assistive device: 1 person hand held assist, None Gait Pattern/deviations: Decreased step length - right, Decreased step length - left, Decreased stride length Gait velocity: decreased     General Gait Details: slightly labored movement with fair/good return for ambulating in room, hallway and able to walk short distances in room without AD   Stairs             Wheelchair Mobility     Tilt Bed    Modified Rankin (Stroke Patients Only)       Balance Overall balance assessment: Needs assistance Sitting-balance support: Feet supported, No upper extremity supported Sitting balance-Leahy Scale: Good Sitting balance - Comments: seated at EOB   Standing balance support: During functional activity, No upper extremity supported Standing balance-Leahy Scale: Fair Standing balance comment: fair/good with hand held assist  Communication Communication Communication: No apparent difficulties  Cognition Arousal: Alert Behavior During Therapy: WFL for tasks assessed/performed   PT - Cognitive impairments: No apparent impairments                          Following commands: Intact      Cueing Cueing Techniques: Verbal cues, Tactile cues  Exercises General Exercises - Lower Extremity Long Arc Quad: Seated, AROM, Strengthening, Both, 10 reps Hip Flexion/Marching: Seated, AROM, Strengthening, Both, 10 reps Toe Raises: Seated, AROM, Strengthening, Both, 10 reps Heel Raises: Seated, AROM, Strengthening, Both, 10 reps    General Comments        Pertinent Vitals/Pain Pain Assessment Pain Assessment: Faces Faces Pain Scale: Hurts little more Pain Location: low back, mild pain in shoulders Pain Descriptors / Indicators: Discomfort, Sore Pain Intervention(s): Limited activity within patient's tolerance, Monitored during session, Repositioned    Home Living                          Prior Function            PT Goals (current goals can now be found in the care plan section) Acute Rehab PT Goals Patient Stated Goal: return home with his family to assist PT Goal Formulation: With patient Time For Goal Achievement: 03/22/24 Potential to Achieve Goals: Good Progress towards PT goals: Progressing toward goals    Frequency    Min 3X/week      PT Plan      Co-evaluation              AM-PAC PT 6 Clicks Mobility   Outcome Measure  Help needed turning from your back to your side while in a flat bed without using bedrails?: None Help needed moving from lying on your back to sitting on the side of a flat bed without using bedrails?: A Little Help needed moving to and from a bed to a chair (including a wheelchair)?: A Little Help needed standing up from a chair using your arms (e.g., wheelchair or bedside chair)?: A Little Help needed to walk in hospital room?: A Little Help needed climbing 3-5 steps with a railing? : A Little 6 Click Score: 19    End of Session   Activity Tolerance: Patient tolerated treatment well Patient left: in bed;with call bell/phone within reach;with family/visitor  present Nurse Communication: Mobility status PT Visit Diagnosis: Unsteadiness on feet (R26.81);Muscle weakness (generalized) (M62.81);Difficulty in walking, not elsewhere classified (R26.2)     Time: 1448-1510 PT Time Calculation (min) (ACUTE ONLY): 22 min  Charges:    $Gait Training: 8-22 mins $Therapeutic Exercise: 8-22 mins PT General Charges $$ ACUTE PT VISIT: 1 Visit                     3:53 PM, 03/19/24 Lynwood Music, MPT Physical Therapist with [facility] 336 [phone] office [phone] mobile phone

## 2024-03-19 NOTE — Progress Notes (Signed)
 Progress Note   Patient: [name] [name] FMW:[id number] DOB: 1975-11-14 DOA: 03/14/2024     4 DOS: the patient was seen and examined on 03/19/2024   Brief hospital course: [name] [name] is a 48 y.o. male with medical history significant of anxiety disorder, tobacco abuse, chronic back pain who presents to the emergency department from home due to generalized weakness and poor oral intake. Patient complained of hurting his back about 5 weeks ago and he has been laying in bed most of the time for about a month.  He believed that he aggravated the back from sleeping in an uncomfortable position.  He denied any falls. For about a week now, wife at bedside reports poor oral intake but has been tolerating liquids without any difficulty.  He denies dysphagia.  But having productive cough of rusty colored sputum and subjective fever within the last few days. Patient denies any IV drug use in the last 6 months, alcohol use in past few months and cigarettes use in the past few days.   ED Course:  Patient was tachypneic, tachycardic, but other vital signs are within normal range.  Workup in the ED showed normocytic anemia, thrombocytopenia.  BMP shows sodium 133, potassium 4.8, chloride 98, bicarb 21, blood glucose 129, BUN 46, creatinine 1.45 (last lab for comparison was creatinine of 0.88 and this was 7 years ago), albumin 2.7, AST 48, ALT 23, ALP 362, total bilirubin 1.6, lactic acid 2.5 > 1.7.  TSH 1.730, ammonia 28.  Blood culture pending CT abdomen pelvis showed small cavitary lung base nodules, most likely due to multifocal septic lung emboli, most of them continue fluid compatible with these being abscesses.  Small pericardial effusion. Chest x-ray showed patchy nodular airspace opacities in both lungs, concerning for infection Patient was treated with IV ceftriaxone  and azithromycin  were given for pneumonia, cefepime and vancomycin  were given for osteomyelitis of the spine. morphine  was given, and  Zofran .   Principal Problem:   Sepsis due to pneumonia [facility]) Active Problems:   Septic embolism (HCC)   Pressure injury of skin   Lactic acidosis   Osteomyelitis of lumbar spine (HCC)   Thrombocytopenia   Transaminasemia   Hypoalbuminemia   Failure to thrive in adult  Assessment and Plan: Sepsis due to pneumonia/ small Septic embolism  Staph aureus bacteremia- POA: Patient met sepsis criteria due to being tachycardic, tachypneic -   Chest x-ray was suggestive of infection and CT abdomen and pelvis was suggestive of septic embolism  IV hydration per sepsis protocol was provided Was started on IV ceftriaxone  and azithromycin  in ED which were broadened to cefepime and vancomycin  therapy after consulting ID, reviewed blood cultures.   Heart rate, blood pressure is improved. Lactic acid improved. Echo reviewed is unremarkable for valvular vegetation.  Blood cultures grew Staph aureus MSSA.  Vancomycin , cefepime stopped. Continue Ancef  therapy as per ID for 2 more days until repeat blood cultures are resulted when ID can give final recommendations. Need 3-4 weeks of IV abx, not an OPAT candidate per ID. Await ID final recs.   Osteomyelitis of the lumbar spine CT abdomen and pelvis was suggestive of discitis/osteomyelitis of posttraumatic change. MRI of lumbar spine: There is discitis/osteomyelitis at L3-L4 and L5-S1 with a septic right facet joint at L5-S1. There is paraspinal inflammation with small bilateral psoas abscesses. The bone marrow signal is diffusely abnormal, possibly due to anemia.  Patient was empirically started on IV vancomycin  and Cefepime. Neurosurgery recommended no surgical intervention. ID evaluation  appreciated, deferred disc aspiration at this time. Cultures grew Staph aureus.  Cultures reviewed, antibiotics tapered to Ancef  therapy.  Repeat cultures so far negative.  Will follow ID regarding final antibiotic recommendations. Continue IV Dilaudid  0.5 mg every 3 hours  as needed for severe pain.  Percocet 5/ 325 mg Q4 hourly for moderate pain. Continue fall precaution. PT OT follow-up.   Thrombocytopenia Transaminitis In the setting of hepatic steatosis. Platelet count, LFTs improved.  Acute on chronic anemia: Patient's hemoglobin dropped to 7.7. Anemia panel shows - Iron levels low, folate levels low. Iron and folate acid supplements ordered.  Hypoalbuminemia possibly due to moderate protein calorie malnutrition Failure to thrive in adult Albumin 2.7, protein supplement will be provided Diet advanced to soft diet.   Pressure ulcer to sacrum-stage II WOC dressing changes as ordered.      Out of bed to chair. Incentive spirometry. Nursing supportive care. Fall, aspiration precautions. Diet:  Diet Orders (From admission, onward)     Start     Ordered   03/17/24 1338  DIET SOFT Room service appropriate? Yes; Fluid consistency: Thin  Diet effective now       Question Answer Comment  Room service appropriate? Yes   Fluid consistency: Thin      03/17/24 1337           DVT prophylaxis: SCDs Start: 03/15/24 0330  Level of care: Telemetry   Code Status: Full Code  Subjective: Patient is seen and examined today morning.  He is more alert. Has constipation, reflux symptoms. Eating fair. Advised to work with PT.  Physical Exam: Vitals:   03/18/24 0544 03/18/24 1348 03/18/24 1926 03/19/24 0334  BP: 131/89 110/71 120/89 112/72  Pulse: 81  (!) 108 (!) 109  Resp: 20  18 18   Temp: 98.6 F (37 C) 99.8 F (37.7 C) 99.5 F (37.5 C) 98.7 F (37.1 C)  TempSrc: Oral Oral Oral Oral  SpO2: 98% 97% 97% 94%  Weight:      Height:        General - Young Caucasian weak male, no apparent distress HEENT - PERRLA, EOMI, atraumatic head, non tender sinuses. Lung - Clear, basal rales, no rhonchi, wheezes. Heart - S1, S2 heard, no murmurs, rubs, trace pedal edema. Abdomen - Soft, non tender, bowel sounds good Neuro - Alert, awake able to  answer, follow simple commands, non focal exam. Skin - Warm and dry.  Data Reviewed:      Latest Ref Rng & Units 03/18/2024    4:19 AM 03/17/2024    4:26 AM 03/16/2024    4:36 AM  CBC  WBC 4.0 - 10.5 K/uL 9.7  10.0  11.1   Hemoglobin 13.0 - 17.0 g/dL 8.3  7.7  9.2   Hematocrit 39.0 - 52.0 % 26.5  24.2  28.8   Platelets 150 - 400 K/uL 213  162  131       Latest Ref Rng & Units 03/18/2024    4:19 AM 03/17/2024    4:26 AM 03/16/2024    4:36 AM  BMP  Glucose 70 - 99 mg/dL 99  898  885   BUN 6 - 20 mg/dL 33  35  40   Creatinine 0.61 - 1.24 mg/dL 8.99  9.05  8.95   Sodium 135 - 145 mmol/L 138  137  136   Potassium 3.5 - 5.1 mmol/L 4.6  4.3  4.4   Chloride 98 - 111 mmol/L 106  107  107   CO2  22 - 32 mmol/L 22  21  21    Calcium 8.9 - 10.3 mg/dL 8.3  8.1  8.4    No results found.   Family Communication: Discussed with patient, understand and agree. All questions answered.  Disposition: Status is: Inpatient Remains inpatient appropriate because: IV antibiotics for bacteremia, osteomyelitis. May remain hospitalized for IV abx treatment as he is not good OPAT candidate due to IV drug use.  Planned Discharge Destination: Home with Home Health     Time spent: 46 minutes  Author: Concepcion Riser, MD 03/19/2024 12:56 PM Secure chat 7am to 7pm For on call review [email or web address].

## 2024-03-19 NOTE — Progress Notes (Signed)
 [facility]                                                                                       Patient Identification: Patient Name: [name] [name] MRN: [id number] Admit Date: 03/14/2024  6:49 PM Today's Date: 03/19/2024 Reason for fu: Disseminated MSSA infection  Principal Problem:   Sepsis due to pneumonia [facility]) Active Problems:   Septic embolism (HCC)   Pressure injury of skin   Lactic acidosis   Osteomyelitis of lumbar spine (HCC)   Thrombocytopenia   Transaminasemia   Hypoalbuminemia   Failure to thrive in adult   I connected with the patient by a video enabled telemedicine application and verified that I am speaking with the correct person using two identifiers.  Location: Patient: [facility] Provider: [facility]   Antibiotics IV cefazolin  10/3- Total days of antibiotics 6  Lines/Hardware:  Assessment # Disseminated MSSA infection with bacteremia, pulmonary septic emboli/abscesses, lumbosacral spinal discitis/osteomyelitis, right facet L5-S1 septic arthritis with associated b/l psoas abscess - no intervention per nsx per chart  - 10/4  cx NG in 3 days  - 10/4 TTE no vegetations and endocarditis  - TEE plan to defer if repeat blood cx stays negative   # Substance use - per primary  # Stage 2 sacral ulcer - woc following   Recommendations  - continue IV cefazolin  through 11/1 assuming 10/4 blood culture stay negative to complete 4 weeks of IV before considering to switch to PO antibiotics/oritavancin  - monitor cbc, bmp, esr and crp at least weekly  - will need to repeat MRI L spine before stopping antibiotics for resolution of psoas abscesses  - HCV, HBV serology ordered  - Recall us  back once approaching close to 11/1 or repeat blood cultures positive or any ID related concerns  - Universal/standard isolation precautions D/w primary team  ID will so for now  Rest of the  management as per the primary team. Please call with questions or concerns.  Thank you for the consult  __________________________________________________________________________________________________________ Subjective Stable back pain and able to ambulate last couple of days. No concerns with antibiotics.   ROS: General- Denies fever, chills, loss of appetite and loss of weight HEENT - Denies headache, blurry vision, neck pain, sinus pain Chest - Denies any chest pain, SOB or cough CVS- Denies any dizziness/lightheadedness, syncopal attacks, palpitations Abdomen- Denies any nausea, vomiting, abdominal pain, hematochezia and diarrhea Neuro - Denies any weakness, numbness, tingling sensation Psych - Denies any changes in mood irritability or depressive symptoms GU- Denies any burning, dysuria, hematuria or increased frequency of urination Skin - denies any rashes/lesions MSK - denies any joint pain/swelling or restricted ROM    Past Medical History:  Diagnosis Date   Anxiety    Back pain    Fracture of rib of right side 01/26/2015   Hypertension    Past Surgical History:  Procedure Laterality Date   TEE WITHOUT CARDIOVERSION N/A 01/21/2014   Procedure: TRANSESOPHAGEAL ECHOCARDIOGRAM (TEE) with propofol ;  Surgeon: Dorn JULIANNA Ross, MD;  Location: [facility];  Service: Endoscopy;  Laterality: N/A;    Scheduled Meds:  (feeding supplement)  PROSource Plus  30 mL Oral BID BM   ascorbic acid  500 mg Oral BID   dextromethorphan-guaiFENesin   1 tablet Oral BID   feeding supplement  237 mL Oral TID BM   [START ON 03/22/2024] ferrous sulfate  325 mg Oral Q breakfast   folic acid   1 mg Oral Daily   multivitamin with minerals  1 tablet Oral Daily   pantoprazole  40 mg Oral Daily   polyethylene glycol  17 g Oral Daily   zinc sulfate (50mg  elemental zinc)  220 mg Oral Daily   Continuous Infusions:   ceFAZolin  (ANCEF ) IV 2 g (03/19/24 1417)   PRN Meds:.acetaminophen  **OR** acetaminophen ,  ALPRAZolam , alum & mag hydroxide-simeth, haloperidol lactate, HYDROmorphone  (DILAUDID ) injection, ondansetron  **OR** ondansetron  (ZOFRAN ) IV, mouth rinse, oxyCODONE -acetaminophen   Allergies  Allergen Reactions   Penicillins Other (See Comments)    Unknown reaction   Social History   Socioeconomic History   Marital status: Single    Spouse name: Not on file   Number of children: Not on file   Years of education: Not on file   Highest education level: Not on file  Occupational History   Not on file  Tobacco Use   Smoking status: Every Day    Current packs/day: 0.50    Types: Cigarettes   Smokeless tobacco: Never  Substance and Sexual Activity   Alcohol use: No    Comment: rare   Drug use: Yes    Types: Heroin    Comment: reports history of cocaine abuse.-last used years ago   Sexual activity: Yes    Birth control/protection: Condom  Other Topics Concern   Not on file  Social History Narrative   Not on file   Social Drivers of Health   Financial Resource Strain: Not on file  Food Insecurity: No Food Insecurity (03/15/2024)   Hunger Vital Sign    Worried About Running Out of Food in the Last Year: Never true    Ran Out of Food in the Last Year: Never true  Transportation Needs: No Transportation Needs (03/15/2024)   PRAPARE - Administrator, Civil Service (Medical): No    Lack of Transportation (Non-Medical): No  Physical Activity: Not on file  Stress: Not on file  Social Connections: Not on file  Intimate Partner Violence: Patient Unable To Answer (03/15/2024)   Humiliation, Afraid, Rape, and Kick questionnaire    Fear of Current or Ex-Partner: Patient unable to answer    Emotionally Abused: Patient unable to answer    Physically Abused: Patient unable to answer    Sexually Abused: Patient unable to answer   History reviewed. No pertinent family history.  Vitals BP (!) 140/87   Pulse (!) 107   Temp 99.1 F (37.3 C) (Oral)   Resp 18   Ht 6' (1.829  m)   Wt 62.5 kg   SpO2 94%   BMI 18.69 kg/m   Virtual visit, unable to examine   Pertinent Microbiology Results for orders placed or performed during the hospital encounter of 03/14/24  Culture, blood (Routine x 2)     Status: Abnormal   Collection Time: 03/14/24  7:34 PM   Specimen: Left Antecubital; Blood  Result Value Ref Range Status   Specimen Description   Final    LEFT ANTECUBITAL Performed at [facility], [address] [place], [place] [address]    Special Requests   Final    BOTTLES DRAWN AEROBIC ONLY Blood Culture results may not be optimal  due to an inadequate volume of blood received in culture bottles Performed at [facility], [address] [place], [place] [address]    Culture  Setup Time   Final    GRAM POSITIVE COCCI AEROBIC BOTTLE ONLY Gram Stain Report Called to,Read Back By and Verified With: JACKSON @ 1141 ON 100325 BY CHARLOTT CROME RN CLENT SPRANG [id number] AT 1949 BY EC Performed at [facility], [address] [place], [place] [address]    Culture STAPHYLOCOCCUS AUREUS (A)  Final   Report Status 03/18/2024 FINAL  Final   Organism ID, Bacteria STAPHYLOCOCCUS AUREUS  Final      Susceptibility   Staphylococcus aureus - MIC*    CIPROFLOXACIN  <=0.5 SENSITIVE Sensitive     ERYTHROMYCIN <=0.25 SENSITIVE Sensitive     GENTAMICIN <=0.5 SENSITIVE Sensitive     OXACILLIN <=0.25 SENSITIVE Sensitive     TETRACYCLINE <=1 SENSITIVE Sensitive     VANCOMYCIN  1 SENSITIVE Sensitive     TRIMETH /SULFA  <=10 SENSITIVE Sensitive     CLINDAMYCIN  <=0.25 SENSITIVE Sensitive     RIFAMPIN <=0.5 SENSITIVE Sensitive     Inducible Clindamycin  NEGATIVE Sensitive     LINEZOLID  2 SENSITIVE Sensitive     * STAPHYLOCOCCUS AUREUS  Blood Culture ID Panel (Reflexed)     Status: Abnormal   Collection Time: 03/14/24  7:34 PM  Result Value Ref Range Status   Enterococcus faecalis NOT DETECTED NOT DETECTED Final   Enterococcus Faecium NOT DETECTED NOT DETECTED Final   Listeria  monocytogenes NOT DETECTED NOT DETECTED Final   Staphylococcus species DETECTED (A) NOT DETECTED Final    Comment: CRITICAL RESULT CALLED TO, READ BACK BY AND VERIFIED WITH: RN LAVERLE SPRANG [id number] AT 1949 BY EC    Staphylococcus aureus (BCID) DETECTED (A) NOT DETECTED Final    Comment: CRITICAL RESULT CALLED TO, READ BACK BY AND VERIFIED WITH: RN CLENT SPRANG [id number] AT 1949 BY EC    Staphylococcus epidermidis NOT DETECTED NOT DETECTED Final   Staphylococcus lugdunensis NOT DETECTED NOT DETECTED Final   Streptococcus species NOT DETECTED NOT DETECTED Final   Streptococcus agalactiae NOT DETECTED NOT DETECTED Final   Streptococcus pneumoniae NOT DETECTED NOT DETECTED Final   Streptococcus pyogenes NOT DETECTED NOT DETECTED Final   A.calcoaceticus-baumannii NOT DETECTED NOT DETECTED Final   Bacteroides fragilis NOT DETECTED NOT DETECTED Final   Enterobacterales NOT DETECTED NOT DETECTED Final   Enterobacter cloacae complex NOT DETECTED NOT DETECTED Final   Escherichia coli NOT DETECTED NOT DETECTED Final   Klebsiella aerogenes NOT DETECTED NOT DETECTED Final   Klebsiella oxytoca NOT DETECTED NOT DETECTED Final   Klebsiella pneumoniae NOT DETECTED NOT DETECTED Final   Proteus species NOT DETECTED NOT DETECTED Final   Salmonella species NOT DETECTED NOT DETECTED Final   Serratia marcescens NOT DETECTED NOT DETECTED Final   Haemophilus influenzae NOT DETECTED NOT DETECTED Final   Neisseria meningitidis NOT DETECTED NOT DETECTED Final   Pseudomonas aeruginosa NOT DETECTED NOT DETECTED Final   Stenotrophomonas maltophilia NOT DETECTED NOT DETECTED Final   Candida albicans NOT DETECTED NOT DETECTED Final   Candida auris NOT DETECTED NOT DETECTED Final   Candida glabrata NOT DETECTED NOT DETECTED Final   Candida krusei NOT DETECTED NOT DETECTED Final   Candida parapsilosis NOT DETECTED NOT DETECTED Final   Candida tropicalis NOT DETECTED NOT DETECTED Final   Cryptococcus  neoformans/gattii NOT DETECTED NOT DETECTED Final   Meth resistant mecA/C and MREJ NOT DETECTED NOT DETECTED Final    Comment: Performed  at [facility], [address] [place], [place] [address]  Culture, blood (Routine x 2)     Status: None   Collection Time: 03/14/24 11:21 PM   Specimen: BLOOD  Result Value Ref Range Status   Specimen Description BLOOD BLOOD RIGHT ARM  Final   Special Requests AEROBIC BOTTLE ONLY Blood Culture adequate volume  Final   Culture   Final    NO GROWTH 5 DAYS Performed at [facility], [address] [place], [place] [address]    Report Status 03/19/2024 FINAL  Final  Culture, blood (Routine X 2) w Reflex to ID Panel     Status: None (Preliminary result)   Collection Time: 03/16/24  4:29 AM   Specimen: BLOOD LEFT HAND  Result Value Ref Range Status   Specimen Description BLOOD LEFT HAND AEROBIC BOTTLE ONLY  Final   Special Requests   Final    Blood Culture results may not be optimal due to an inadequate volume of blood received in culture bottles   Culture   Final    NO GROWTH 3 DAYS Performed at [facility], [address] [place], [place] [address]    Report Status PENDING  Incomplete  Culture, blood (Routine X 2) w Reflex to ID Panel     Status: None (Preliminary result)   Collection Time: 03/16/24  4:36 AM   Specimen: BLOOD LEFT HAND  Result Value Ref Range Status   Specimen Description   Final    BLOOD LEFT HAND BOTTLES DRAWN AEROBIC AND ANAEROBIC   Special Requests Blood Culture adequate volume  Final   Culture   Final    NO GROWTH 3 DAYS Performed at [facility], [address], [place], [place] [address]    Report Status PENDING  Incomplete   Pertinent Lab seen by me:    Latest Ref Rng & Units 03/18/2024    4:19 AM 03/17/2024    4:26 AM 03/16/2024    4:36 AM  CBC  WBC 4.0 - 10.5 K/uL 9.7  10.0  11.1   Hemoglobin 13.0 - 17.0 g/dL 8.3  7.7  9.2   Hematocrit 39.0 - 52.0 % 26.5  24.2  28.8   Platelets 150 - 400 K/uL 213  162   131       Latest Ref Rng & Units 03/18/2024    4:19 AM 03/17/2024    4:26 AM 03/16/2024    4:36 AM  CMP  Glucose 70 - 99 mg/dL 99  898  885   BUN 6 - 20 mg/dL 33  35  40   Creatinine 0.61 - 1.24 mg/dL 8.99  9.05  8.95   Sodium 135 - 145 mmol/L 138  137  136   Potassium 3.5 - 5.1 mmol/L 4.6  4.3  4.4   Chloride 98 - 111 mmol/L 106  107  107   CO2 22 - 32 mmol/L 22  21  21    Calcium 8.9 - 10.3 mg/dL 8.3  8.1  8.4   Total Protein 6.5 - 8.1 g/dL 6.5  6.1  6.4   Total Bilirubin 0.0 - 1.2 mg/dL 0.6  0.9  0.9   Alkaline Phos 38 - 126 U/L 318  306  279   AST 15 - 41 U/L 68  53  41   ALT 0 - 44 U/L 24  21  21      Pertinent Imagings/Other Imagings Plain films and CT images have been personally visualized and interpreted; radiology reports have  been reviewed. Decision making incorporated into the Impression / Recommendations.  ECHOCARDIOGRAM COMPLETE Result Date: 03/16/2024    ECHOCARDIOGRAM REPORT   Patient Name:   MADDIX HEINZ [facility] Date of Exam: 03/15/2024 Medical Rec #:  [id number]        Height:       72.0 in Accession #:    [id number]       Weight:       137.8 lb Date of Birth:  1976/05/13        BSA:          1.819 m Patient Age:    48 years         BP:           100/66 mmHg Patient Gender: M                HR:           84 bpm. Exam Location:  [facility] Procedure: 2D Echo, Cardiac Doppler and Color Doppler (Both Spectral and Color            Flow Doppler were utilized during procedure). Indications:    Endocarditis l38  History:        Patient has prior history of Echocardiogram examinations, most                 recent 01/21/2014. Risk Factors:Current Smoker and Hypertension.                 Sepsis due to pneumonia [facility]), Septic embolism (HCC).  Sonographer:    Aida Pizza RCS Referring Phys: [id number] TRUNG T VU IMPRESSIONS  1. Left ventricular ejection fraction, by estimation, is 60 to 65%. The left ventricle has normal function. The left ventricle has no regional wall motion abnormalities. Left  ventricular diastolic parameters were normal.  2. Right ventricular systolic function is normal. The right ventricular size is normal. Tricuspid regurgitation signal is inadequate for assessing PA pressure.  3. Left atrial size was mildly dilated.  4. The mitral valve is normal in structure. No evidence of mitral valve regurgitation. No evidence of mitral stenosis.  5. The aortic valve is tricuspid. Aortic valve regurgitation is not visualized. No aortic stenosis is present.  6. The inferior vena cava is normal in size with greater than 50% respiratory variability, suggesting right atrial pressure of 3 mmHg. Comparison(s): No prior Echocardiogram. Conclusion(s)/Recommendation(s): No evidence of valvular vegetations on this transthoracic echocardiogram. Consider a transesophageal echocardiogram to exclude infective endocarditis if clinically indicated. FINDINGS  Left Ventricle: Left ventricular ejection fraction, by estimation, is 60 to 65%. The left ventricle has normal function. The left ventricle has no regional wall motion abnormalities. Strain was performed and the global longitudinal strain is indeterminate. The left ventricular internal cavity size was normal in size. There is no left ventricular hypertrophy. Left ventricular diastolic parameters were normal. Right Ventricle: The right ventricular size is normal. No increase in right ventricular wall thickness. Right ventricular systolic function is normal. Tricuspid regurgitation signal is inadequate for assessing PA pressure. Left Atrium: Left atrial size was mildly dilated. Right Atrium: Right atrial size was normal in size. Pericardium: There is no evidence of pericardial effusion. Mitral Valve: The mitral valve is normal in structure. No evidence of mitral valve regurgitation. No evidence of mitral valve stenosis. Tricuspid Valve: The tricuspid valve is normal in structure. Tricuspid valve regurgitation is trivial. No evidence of tricuspid stenosis.  Aortic Valve: The aortic valve is tricuspid. Aortic valve regurgitation is not visualized. No  aortic stenosis is present. Pulmonic Valve: The pulmonic valve was not well visualized. Pulmonic valve regurgitation is not visualized. No evidence of pulmonic stenosis. Aorta: The aortic root is normal in size and structure. Venous: The inferior vena cava is normal in size with greater than 50% respiratory variability, suggesting right atrial pressure of 3 mmHg. IAS/Shunts: No atrial level shunt detected by color flow Doppler. Additional Comments: 3D was performed not requiring image post processing on an independent workstation and was indeterminate.  LEFT VENTRICLE PLAX 2D LVIDd:         5.30 cm   Diastology LVIDs:         3.70 cm   LV e' medial:    11.30 cm/s LV PW:         1.10 cm   LV E/e' medial:  7.6 LV IVS:        1.00 cm   LV e' lateral:   14.20 cm/s LVOT diam:     2.20 cm   LV E/e' lateral: 6.1 LV SV:         67 LV SV Index:   37 LVOT Area:     3.80 cm  RIGHT VENTRICLE RV S prime:     22.20 cm/s TAPSE (M-mode): 2.5 cm LEFT ATRIUM           Index        RIGHT ATRIUM           Index LA diam:      3.30 cm 1.81 cm/m   RA Area:     17.10 cm LA Vol (A2C): 35.5 ml 19.52 ml/m  RA Volume:   46.20 ml  25.41 ml/m LA Vol (A4C): 61.8 ml 33.98 ml/m  AORTIC VALVE LVOT Vmax:   81.30 cm/s LVOT Vmean:  44.100 cm/s LVOT VTI:    0.175 m  AORTA Ao Root diam: 3.60 cm MITRAL VALVE MV Area (PHT): 3.03 cm    SHUNTS MV Decel Time: 250 msec    Systemic VTI:  0.18 m MV E velocity: 86.20 cm/s  Systemic Diam: 2.20 cm MV A velocity: 54.60 cm/s MV E/A ratio:  1.58 Vishnu Priya Mallipeddi Electronically signed by Diannah Late Mallipeddi Signature Date/Time: 03/16/2024/9:42:16 AM    Final    MR Lumbar Spine W Wo Contrast Result Date: 03/15/2024 CLINICAL DATA:  Back pain and weakness EXAM: MRI LUMBAR SPINE WITHOUT AND WITH CONTRAST TECHNIQUE: Multiplanar and multiecho pulse sequences of the lumbar spine were obtained without and with  intravenous contrast. CONTRAST:  6mL GADAVIST GADOBUTROL 1 MMOL/ML IV SOLN COMPARISON:  January 16, 2014 FINDINGS: The bone marrow signal is diffusely abnormal with lower than normal signal on the T1 weighted image. There is loss of disc height at L3-L4 with mild edema and enhancement in the vertebral endplates on the left with paraspinal inflammation. There is edema and enhancement of the right-side of the vertebral endplates at L5-S1 with paraspinal inflammation. The right facet joint at L5-S1 is inflamed with enhancement in the adjacent soft tissues and a small abscess posterior to the facet joint. There are bilateral small psoas abscesses. There is no epidural abscess. There is no spinal stenosis. There is pseudoarthrosis/partial fusion of the right transverse process of L5 and the right-side of the sacrum with a chronic signal abnormality in the bone in this location. IMPRESSION: There is discitis/osteomyelitis at L3-L4 and L5-S1 with a septic right facet joint at L5-S1. There is paraspinal inflammation with small bilateral psoas abscesses. The bone marrow signal is diffusely  abnormal, possibly due to anemia. A diffuse infiltrative process is possible but considered less likely. Electronically Signed   By: Nancyann Burns M.D.   On: 03/15/2024 11:39   CT ABDOMEN PELVIS W CONTRAST Result Date: 03/14/2024 CLINICAL DATA:  Suspected sepsis. Low energy x5 weeks worsening in the last week. Infiltrates on chest x-ray. Abdominal pain, acute, nonlocalized with known herniated lumbar disc with recent re-injury per family. EXAM: CT ABDOMEN AND PELVIS WITH CONTRAST CT LUMBAR SPINE WITHOUT CONTRAST TECHNIQUE: Multidetector CT imaging of the abdomen and pelvis was performed using the standard protocol following bolus administration of intravenous contrast. Multidetector CT imaging of the lumbar spine was performed without the use of contrast. Multiplanar CT image reconstructions were also generated and reviewed. RADIATION DOSE  REDUCTION: This exam was performed according to the departmental dose-optimization program which includes automated exposure control, adjustment of the mA and/or kV according to patient size and/or use of iterative reconstruction technique. CONTRAST:  OMNIPAQUE  IOHEXOL  300 MG/ML  SOLN COMPARISON:  CT abdomen and pelvis with contrast 02/02/2015. FINDINGS: CT ABDOMEN AND PELVIS WITH CONTRAST FINDINGS Lower chest: There are numerous diffusely scattered small cavitary lesions both lung bases, seen as nodular infiltrates on chest x-ray. Findings are most likely due to multifocal septic lung emboli, and most of them contain fluid compatible with these being abscesses. The largest of these is in the anteromedial right middle lobe measuring 3.2 cm on the first image. The largest in the right lower lobe is in the anterior basal segment measuring 2.7 cm on 6:8. These are less numerous in the left lung base. The largest left lung base cavity is in the lingular base measuring 2.7 cm on 6:8 and there is patchy airspace disease associated with smaller cavitary foci in the posterior basal left lower lobe. There is a small pericardial effusion. The pericardial fluid is low in density. The cardiac size is normal. Hepatobiliary: The liver is mildly steatotic measuring 21 cm length. There is no mass enhancement or abscess. Gallbladder and bile ducts are unremarkable. Pancreas: No abnormality Spleen: No mass. Splenomegaly. The spleen measures 12.3 x 4.1 x 16 cm. Adrenals/Urinary Tract: there is no adrenal or renal mass enhancement, no urinary stone or obstruction. There is a Bosniak 1 cyst in the superior pole of the right kidney measuring 1.2 cm, 13 Hounsfield units. Smaller Bosniak 2 cortical cyst in the mid pole of this kidney is too small characterize. No follow-up imaging is recommended. Both kidneys are otherwise unremarkable and secrete symmetrically on the delayed images. The bladder is mildly thickened which could be due  to underdistention, hypertrophy or cystitis. Stomach/Bowel: No dilatation. No overt wall thickening. Limited assessment without enteric contrast. Normal caliber appendix. Scattered sigmoid diverticulosis. No evidence of acute diverticulitis. Vascular/Lymphatic: Patchy aortoiliac calcific plaque, age advanced, increased from 9 years ago. Clinical correlation advised. There is a prominent hepatic portal vein measuring 16 mm and increasingly engorged splenic vein. The SMV and tributaries are normal caliber. No adenopathy is seen. Reproductive: No prostatomegaly.  Pelvic phleboliths. Other: Generalized mesenteric congestive changes. No focal inflammatory process is seen through the generalized edema. There is a small volume of pelvic ascites. There is no free hemorrhage, free air or incarcerated hernia. There is mild body wall anasarca. Musculoskeletal: Mild dextroscoliosis, degenerative changes of the lumbar spine, with additional lumbar findings which will be described below. CT LUMBAR SPINE WITHOUT CONTRAST FINDINGS Segmentation: There are 5 lumbar type segments, but L5 is transitional demonstrating right hemisacralization. Alignment: There is mild lumbar  dextroscoliosis apex at L1-2. There is a trace, likely discogenic retrolisthesis at L2-3 where there is disc space loss. No other listhesis or further malalignment is seen. Vertebrae: At L2-3 there are small anterior and posterior peridiscal endplate lucencies with slight fragmentation. At L4-5, there is peridiscal endplate fragmentation and subcortical lucency to the right. Both findings could have an infectious or posttraumatic basis. The patient does relate a history of recent back injury. The findings at L2-3 are less clearly posttraumatic given the location anteriorly and posteriorly. At L1, there is nondisplaced bony fragmentation of the anterior superior vertebral body without height loss. All vertebrae are grossly normal in heights. This latter finding is  almost certainly due to trauma. Other vertebrae are grossly intact. There is mild-to-moderate thoracolumbar spondylosis. Paraspinal soft tissues: There is paraspinal soft tissue thickening at L2-3. This could be due to paraspinal phlegmon or posttraumatic reactive soft tissue change. At L4-5 there is similar paraspinal soft tissue thickening on the right likely on the same etiology. There is no psoas abscess or localizing paraspinal fluid collection. Disc levels: T11-12 through L1-2: Inferior osteophytes. Normal disc heights. No herniations or stenoses. L2-3: Moderate disc space loss greater on the left. Left paracentral to left far lateral disc bulge extends into the left foramen with moderate to severe left foraminal stenosis. There is mild right foraminal stenosis. There is moderate effacement of the left subarticular zone but no significant spinal stenosis, no herniation is seen. L3-4: Normal disc height. Mild diffuse annular bulge. No herniation, canal zone or foraminal zone stenosis. L4-5: Mild disc space loss. Mild diffuse disc bulge without herniation or stenosis except for mild subarticular zone encroachment. The foramina appear clear. L5-S1: No abnormality. Other: Both SI joints are patent with slight spurring. No SI joint erosions. IMPRESSION: 1. Numerous small cavitary lung base nodules, most likely due to multifocal septic lung emboli, most of them containing fluid compatible with these being abscesses. 2. Small pericardial effusion. 3. Generalized mesenteric congestive changes, small volume of pelvic ascites, and mild body wall anasarca. 4. Mild hepatic steatosis with hepatosplenomegaly and increased prominence of the hepatic portal vein and splenic vein. 5. Cystitis versus bladder hypertrophy or underdistention. 6. Age advanced aortoiliac atherosclerosis. 7. L2-3 disc space loss with small anterior and posterior peridiscal endplate lucencies with slight fragmentation. There is paraspinal soft tissue  thickening at this level which could be due to paraspinal phlegmon or posttraumatic reactive soft tissue change. 8. L4-5 peridiscal endplate fragmentation and subcortical lucency to the right, with paraspinal soft tissue thickening on the right. Findings could be due to discitis/osteomyelitis or posttraumatic change. 9. Nondisplaced bony fragmentation of the anterior superior L1 vertebral body without height loss. This is almost certainly due to trauma. 10. L2-3 left paracentral to left far lateral disc bulge with moderate to severe left foraminal stenosis and moderate effacement of the left subarticular zone. 11. L4-5 mild subarticular zone encroachment. 12. Lumbar MRI without and with contrast may be helpful to further characterize these findings. Aortic Atherosclerosis (ICD10-I70.0). Electronically Signed   By: Francis Quam M.D.   On: 03/14/2024 22:24   CT L-SPINE NO CHARGE Result Date: 03/14/2024 CLINICAL DATA:  Suspected sepsis. Low energy x5 weeks worsening in the last week. Infiltrates on chest x-ray. Abdominal pain, acute, nonlocalized with known herniated lumbar disc with recent re-injury per family. EXAM: CT ABDOMEN AND PELVIS WITH CONTRAST CT LUMBAR SPINE WITHOUT CONTRAST TECHNIQUE: Multidetector CT imaging of the abdomen and pelvis was performed using the standard protocol following bolus administration  of intravenous contrast. Multidetector CT imaging of the lumbar spine was performed without the use of contrast. Multiplanar CT image reconstructions were also generated and reviewed. RADIATION DOSE REDUCTION: This exam was performed according to the departmental dose-optimization program which includes automated exposure control, adjustment of the mA and/or kV according to patient size and/or use of iterative reconstruction technique. CONTRAST:  OMNIPAQUE  IOHEXOL  300 MG/ML  SOLN COMPARISON:  CT abdomen and pelvis with contrast 02/02/2015. FINDINGS: CT ABDOMEN AND PELVIS WITH CONTRAST FINDINGS  Lower chest: There are numerous diffusely scattered small cavitary lesions both lung bases, seen as nodular infiltrates on chest x-ray. Findings are most likely due to multifocal septic lung emboli, and most of them contain fluid compatible with these being abscesses. The largest of these is in the anteromedial right middle lobe measuring 3.2 cm on the first image. The largest in the right lower lobe is in the anterior basal segment measuring 2.7 cm on 6:8. These are less numerous in the left lung base. The largest left lung base cavity is in the lingular base measuring 2.7 cm on 6:8 and there is patchy airspace disease associated with smaller cavitary foci in the posterior basal left lower lobe. There is a small pericardial effusion. The pericardial fluid is low in density. The cardiac size is normal. Hepatobiliary: The liver is mildly steatotic measuring 21 cm length. There is no mass enhancement or abscess. Gallbladder and bile ducts are unremarkable. Pancreas: No abnormality Spleen: No mass. Splenomegaly. The spleen measures 12.3 x 4.1 x 16 cm. Adrenals/Urinary Tract: there is no adrenal or renal mass enhancement, no urinary stone or obstruction. There is a Bosniak 1 cyst in the superior pole of the right kidney measuring 1.2 cm, 13 Hounsfield units. Smaller Bosniak 2 cortical cyst in the mid pole of this kidney is too small characterize. No follow-up imaging is recommended. Both kidneys are otherwise unremarkable and secrete symmetrically on the delayed images. The bladder is mildly thickened which could be due to underdistention, hypertrophy or cystitis. Stomach/Bowel: No dilatation. No overt wall thickening. Limited assessment without enteric contrast. Normal caliber appendix. Scattered sigmoid diverticulosis. No evidence of acute diverticulitis. Vascular/Lymphatic: Patchy aortoiliac calcific plaque, age advanced, increased from 9 years ago. Clinical correlation advised. There is a prominent hepatic portal  vein measuring 16 mm and increasingly engorged splenic vein. The SMV and tributaries are normal caliber. No adenopathy is seen. Reproductive: No prostatomegaly.  Pelvic phleboliths. Other: Generalized mesenteric congestive changes. No focal inflammatory process is seen through the generalized edema. There is a small volume of pelvic ascites. There is no free hemorrhage, free air or incarcerated hernia. There is mild body wall anasarca. Musculoskeletal: Mild dextroscoliosis, degenerative changes of the lumbar spine, with additional lumbar findings which will be described below. CT LUMBAR SPINE WITHOUT CONTRAST FINDINGS Segmentation: There are 5 lumbar type segments, but L5 is transitional demonstrating right hemisacralization. Alignment: There is mild lumbar dextroscoliosis apex at L1-2. There is a trace, likely discogenic retrolisthesis at L2-3 where there is disc space loss. No other listhesis or further malalignment is seen. Vertebrae: At L2-3 there are small anterior and posterior peridiscal endplate lucencies with slight fragmentation. At L4-5, there is peridiscal endplate fragmentation and subcortical lucency to the right. Both findings could have an infectious or posttraumatic basis. The patient does relate a history of recent back injury. The findings at L2-3 are less clearly posttraumatic given the location anteriorly and posteriorly. At L1, there is nondisplaced bony fragmentation of the anterior superior vertebral body  without height loss. All vertebrae are grossly normal in heights. This latter finding is almost certainly due to trauma. Other vertebrae are grossly intact. There is mild-to-moderate thoracolumbar spondylosis. Paraspinal soft tissues: There is paraspinal soft tissue thickening at L2-3. This could be due to paraspinal phlegmon or posttraumatic reactive soft tissue change. At L4-5 there is similar paraspinal soft tissue thickening on the right likely on the same etiology. There is no psoas  abscess or localizing paraspinal fluid collection. Disc levels: T11-12 through L1-2: Inferior osteophytes. Normal disc heights. No herniations or stenoses. L2-3: Moderate disc space loss greater on the left. Left paracentral to left far lateral disc bulge extends into the left foramen with moderate to severe left foraminal stenosis. There is mild right foraminal stenosis. There is moderate effacement of the left subarticular zone but no significant spinal stenosis, no herniation is seen. L3-4: Normal disc height. Mild diffuse annular bulge. No herniation, canal zone or foraminal zone stenosis. L4-5: Mild disc space loss. Mild diffuse disc bulge without herniation or stenosis except for mild subarticular zone encroachment. The foramina appear clear. L5-S1: No abnormality. Other: Both SI joints are patent with slight spurring. No SI joint erosions. IMPRESSION: 1. Numerous small cavitary lung base nodules, most likely due to multifocal septic lung emboli, most of them containing fluid compatible with these being abscesses. 2. Small pericardial effusion. 3. Generalized mesenteric congestive changes, small volume of pelvic ascites, and mild body wall anasarca. 4. Mild hepatic steatosis with hepatosplenomegaly and increased prominence of the hepatic portal vein and splenic vein. 5. Cystitis versus bladder hypertrophy or underdistention. 6. Age advanced aortoiliac atherosclerosis. 7. L2-3 disc space loss with small anterior and posterior peridiscal endplate lucencies with slight fragmentation. There is paraspinal soft tissue thickening at this level which could be due to paraspinal phlegmon or posttraumatic reactive soft tissue change. 8. L4-5 peridiscal endplate fragmentation and subcortical lucency to the right, with paraspinal soft tissue thickening on the right. Findings could be due to discitis/osteomyelitis or posttraumatic change. 9. Nondisplaced bony fragmentation of the anterior superior L1 vertebral body without  height loss. This is almost certainly due to trauma. 10. L2-3 left paracentral to left far lateral disc bulge with moderate to severe left foraminal stenosis and moderate effacement of the left subarticular zone. 11. L4-5 mild subarticular zone encroachment. 12. Lumbar MRI without and with contrast may be helpful to further characterize these findings. Aortic Atherosclerosis (ICD10-I70.0). Electronically Signed   By: Francis Quam M.D.   On: 03/14/2024 22:24   DG Chest Port 1 View if patient is in a treatment room. Result Date: 03/14/2024 EXAM: 1 VIEW(S) XRAY OF THE CHEST 03/14/2024 08:26:20 PM COMPARISON: None available. CLINICAL HISTORY: Suspected Sepsis. Table formatting from the original note was not included.; Per triage: ; Pt c/o being sick for a while. Wife states re injured his herniated disk so he started just lying around for about 5 weeks but worse the last week and not eating. Losing week. Pt appears slightly jaundiced. Smells of urine. A/o but slow to respond. ; coughing FINDINGS: LUNGS AND PLEURA: Patchy nodular airspace opacities in both lungs. No pulmonary edema. No pleural effusion. No pneumothorax. HEART AND MEDIASTINUM: No acute abnormality of the cardiac and mediastinal silhouettes. BONES AND SOFT TISSUES: No acute osseous abnormality. IMPRESSION: 1. Patchy nodular airspace opacities in both lungs, concerning for infection. Electronically signed by: Ozell Daring MD 03/14/2024 08:32 PM EDT RP Workstation: [id number]    I discussed the limitations of evaluation and management by telemedicine. The  patient expressed understanding and agreed to proceed.  I discussed the assessment and treatment plan with the patient. The patient was provided an opportunity to ask questions and all were answered. The patient agreed with the plan and demonstrated an understanding of the instructions.   The patient was advised to call back or seek an in-person evaluation if the symptoms worsen or if the  condition fails to improve as anticipated.  I spent 56 minutes involved in face-to-face and non-face-to-face activities for this patient on the day of the visit. Professional time spent includes the following activities: Preparing to see the patient (review of tests), Obtaining and reviewing separately obtained history (hospitalist progress note, Dr. Overton notes), Ordering medications/labs, referring and communicating with other health care professionals, Documenting clinical information in the EMR, Independently interpreting results (not separately reported), Communicating results to the patient, Counseling and educating the patient and Care coordination (not separately reported).  Electronically signed by:   Plan d/w requesting provider as well as ID pharm D  Of note, portions of this note may have been created with voice recognition software. While this note has been edited for accuracy, occasional wrong-word or 'sound-a-like' substitutions may have occurred due to the inherent limitations of voice recognition software.   Annalee Orem, MD Infectious Disease Physician [facility] Pager: [phone]

## 2024-03-19 NOTE — Progress Notes (Signed)
 Mobility Specialist Progress Note:    03/19/24 1255  Mobility  Activity Ambulated with assistance  Level of Assistance Modified independent, requires aide device or extra time  Assistive Device Front wheel walker  Distance Ambulated (ft) 240 ft  Range of Motion/Exercises Active;All extremities  Activity Response Tolerated well  Mobility Referral Yes  Mobility visit 1 Mobility  Mobility Specialist Start Time (ACUTE ONLY) 1255  Mobility Specialist Stop Time (ACUTE ONLY) 1315  Mobility Specialist Time Calculation (min) (ACUTE ONLY) 20 min   Pt received in bed, agreeable to mobility. ModI to stand and ambulate with RW. Tolerated well,asx throughout. Left sitting EOB, alarm on. All needs met.  Jhonathan Desroches Mobility Specialist Please contact via Special educational needs teacher or  Rehab office at [phone]

## 2024-03-20 MED ORDER — CEFAZOLIN SODIUM-DEXTROSE 2-4 GM/100ML-% IV SOLN
2.0000 g | Freq: Three times a day (TID) | INTRAVENOUS | Status: DC
  Administered 2024-03-20 – 2024-03-25 (×15): 2 g via INTRAVENOUS
  Filled 2024-03-20 (×15): qty 100

## 2024-03-20 NOTE — Progress Notes (Signed)
 Progress Note   Patient: [name] [name] FMW:[id number] DOB: April 30, 1976 DOA: 03/14/2024     5 DOS: the patient was seen and examined on 03/20/2024   Brief hospital course: [name] [name] is a 48 y.o. male with medical history significant of anxiety disorder, tobacco abuse, chronic back pain who presents to the emergency department from home due to generalized weakness and poor oral intake. Patient complained of hurting his back about 5 weeks ago and he has been laying in bed most of the time for about a month.  He believed that he aggravated the back from sleeping in an uncomfortable position.  He denied any falls. For about a week now, wife at bedside reports poor oral intake but has been tolerating liquids without any difficulty.  He denies dysphagia.  But having productive cough of rusty colored sputum and subjective fever within the last few days. Patient denies any IV drug use in the last 6 months, alcohol use in past few months and cigarettes use in the past few days.   ED Course:  Patient was tachypneic, tachycardic, but other vital signs are within normal range.  Workup in the ED showed normocytic anemia, thrombocytopenia.  BMP shows sodium 133, potassium 4.8, chloride 98, bicarb 21, blood glucose 129, BUN 46, creatinine 1.45 (last lab for comparison was creatinine of 0.88 and this was 7 years ago), albumin 2.7, AST 48, ALT 23, ALP 362, total bilirubin 1.6, lactic acid 2.5 > 1.7.  TSH 1.730, ammonia 28.  Blood culture pending CT abdomen pelvis showed small cavitary lung base nodules, most likely due to multifocal septic lung emboli, most of them continue fluid compatible with these being abscesses.  Small pericardial effusion. Chest x-ray showed patchy nodular airspace opacities in both lungs, concerning for infection Patient was treated with IV ceftriaxone  and azithromycin  were given for pneumonia, cefepime and vancomycin  were given for osteomyelitis of the spine. morphine  was given, and  Zofran .     Principal Problem:   Sepsis due to pneumonia [facility]) Active Problems:   Septic embolism (HCC)   Pressure injury of skin   Lactic acidosis   Osteomyelitis of lumbar spine (HCC)   Thrombocytopenia   Transaminasemia   Hypoalbuminemia   Failure to thrive in adult  Assessment and Plan: Sepsis due to pneumonia/ small Septic embolism Osteomyelitis of the lumbar spine Staph aureus bacteremia- POA: Patient met sepsis criteria due to being tachycardic, tachypneic -   Chest x-ray was suggestive of infection and CT abdomen and pelvis was suggestive of septic embolism  IV hydration per sepsis protocol was provided Was started on IV ceftriaxone  and azithromycin  in ED which were broadened to cefepime and vancomycin  therapy after consulting ID, reviewed blood cultures.  CT abdomen and pelvis was suggestive of discitis/osteomyelitis of posttraumatic change. MRI of lumbar spine: There is discitis/osteomyelitis at L3-L4 and L5-S1 with a septic right facet joint at L5-S1. There is paraspinal inflammation with small bilateral psoas abscesses. The bone marrow signal is diffusely abnormal, possibly due to anemia.   ID evaluated the patient. Blood cultures grew Staph aureus MSSA.  Vancomycin , cefepime stopped. Sepsis pathology resolved, echocardiogram unremarkable for valvular vegetation, TEE deferred at this time.  Continue Ancef  therapy for total 4 weeks per ID recommendations if repeat cultures remain negative. Repeat MRI L spine after abx completion.  Continue IV Dilaudid  0.5 mg every 3 hours as needed for severe pain.  Percocet 5/ 325 mg Q4 hourly for moderate pain. Advised oral pain meds as possible. Continue fall  precaution. PT OT follow-up.  Encourage out of bed.   Thrombocytopenia Transaminitis In the setting of hepatic steatosis. Pete platelets, LFTs ordered.  Acute on chronic anemia: Patient's hemoglobin dropped to 7.7. Anemia panel shows - Iron levels low, folate levels low. Iron  and folate acid supplements ordered. Repeat CBC for tomorrow ordered.  Hypoalbuminemia possibly due to moderate protein calorie malnutrition Failure to thrive in adult Albumin 2.2, protein supplement will be provided Diet advanced to soft diet.   Pressure ulcer to sacrum-stage II WOC dressing changes as ordered.      Out of bed to chair. Incentive spirometry. Nursing supportive care. Fall, aspiration precautions. Diet:  Diet Orders (From admission, onward)     Start     Ordered   03/17/24 1338  DIET SOFT Room service appropriate? Yes; Fluid consistency: Thin  Diet effective now       Question Answer Comment  Room service appropriate? Yes   Fluid consistency: Thin      03/17/24 1337           DVT prophylaxis: SCDs Start: 03/15/24 0330  Level of care: Telemetry   Code Status: Full Code  Subjective: Patient is seen and examined today morning.  He is more alert.  Asks for pain medications.  Advised to be out of bed, work with PT.  Physical Exam: Vitals:   03/19/24 1426 03/19/24 1931 03/20/24 0312 03/20/24 1246  BP: (!) 140/87 130/77 (!) 138/95 109/63  Pulse: (!) 107 91  74  Resp:  17 18   Temp: 99.1 F (37.3 C) 98.8 F (37.1 C) 98.6 F (37 C) 98.2 F (36.8 C)  TempSrc: Oral Oral Oral Oral  SpO2: 94% 94% 98% 96%  Weight:      Height:        General - Young Caucasian weak male, no apparent distress HEENT - PERRLA, EOMI, atraumatic head, non tender sinuses. Lung - Clear, basal rales, no rhonchi, wheezes. Heart - S1, S2 heard, no murmurs, rubs, trace pedal edema. Abdomen - Soft, non tender, bowel sounds good Neuro - Alert, awake able to answer, follow simple commands, non focal exam. Skin - Warm and dry.  Data Reviewed:      Latest Ref Rng & Units 03/18/2024    4:19 AM 03/17/2024    4:26 AM 03/16/2024    4:36 AM  CBC  WBC 4.0 - 10.5 K/uL 9.7  10.0  11.1   Hemoglobin 13.0 - 17.0 g/dL 8.3  7.7  9.2   Hematocrit 39.0 - 52.0 % 26.5  24.2  28.8    Platelets 150 - 400 K/uL 213  162  131       Latest Ref Rng & Units 03/18/2024    4:19 AM 03/17/2024    4:26 AM 03/16/2024    4:36 AM  BMP  Glucose 70 - 99 mg/dL 99  898  885   BUN 6 - 20 mg/dL 33  35  40   Creatinine 0.61 - 1.24 mg/dL 8.99  9.05  8.95   Sodium 135 - 145 mmol/L 138  137  136   Potassium 3.5 - 5.1 mmol/L 4.6  4.3  4.4   Chloride 98 - 111 mmol/L 106  107  107   CO2 22 - 32 mmol/L 22  21  21    Calcium 8.9 - 10.3 mg/dL 8.3  8.1  8.4    No results found.   Family Communication: Discussed with patient, understand and agree. All questions answered.  Disposition: Status  is: Inpatient Remains inpatient appropriate because: Long-term antibiotic therapy.  Not a good OPAT candidate.  May need to stay in the hospital until antibiotics finished. Discharge Destination: Home with Home Health     Time spent: 44 minutes  Author: Concepcion Riser, MD 03/20/2024 2:49 PM Secure chat 7am to 7pm For on call review [email or web address].

## 2024-03-20 NOTE — Progress Notes (Signed)
 Mobility Specialist Progress Note:    03/20/24 1025  Mobility  Activity Ambulated with assistance  Level of Assistance Modified independent, requires aide device or extra time  Assistive Device Front wheel walker  Distance Ambulated (ft) 300 ft  Range of Motion/Exercises Active;All extremities  Activity Response Tolerated well  Mobility Referral Yes  Mobility visit 1 Mobility  Mobility Specialist Start Time (ACUTE ONLY) 1025  Mobility Specialist Stop Time (ACUTE ONLY) 1045  Mobility Specialist Time Calculation (min) (ACUTE ONLY) 20 min   Pt received in bed, agreeable to mobility. ModI to stand and ambulate with no AD. Tolerated well, c/o low back and buttocks pain. Returned supine, all needs met.  Jannifer Fischler Mobility Specialist Please contact via Special educational needs teacher or  Rehab office at [phone]

## 2024-03-20 NOTE — Plan of Care (Signed)

## 2024-03-21 LAB — COMPREHENSIVE METABOLIC PANEL WITH GFR
ALT: 11 U/L (ref 0–44)
AST: 40 U/L (ref 15–41)
Albumin: 2.2 g/dL — ABNORMAL LOW (ref 3.5–5.0)
Alkaline Phosphatase: 240 U/L — ABNORMAL HIGH (ref 38–126)
Anion gap: 8 (ref 5–15)
BUN: 20 mg/dL (ref 6–20)
CO2: 22 mmol/L (ref 22–32)
Calcium: 7.9 mg/dL — ABNORMAL LOW (ref 8.9–10.3)
Chloride: 102 mmol/L (ref 98–111)
Creatinine, Ser: 0.9 mg/dL (ref 0.61–1.24)
GFR, Estimated: >60 mL/min (ref >60)
Glucose, Bld: 108 mg/dL — ABNORMAL HIGH (ref 70–99)
Potassium: 4.1 mmol/L (ref 3.5–5.1)
Sodium: 133 mmol/L — ABNORMAL LOW (ref 135–145)
Total Bilirubin: 0.5 mg/dL (ref 0.0–1.2)
Total Protein: 6.5 g/dL (ref 6.5–8.1)

## 2024-03-21 LAB — CULTURE, BLOOD (ROUTINE X 2)
Culture: NO GROWTH
Culture: NO GROWTH
Special Requests: ADEQUATE

## 2024-03-21 LAB — HEPATITIS B CORE ANTIBODY, TOTAL: HEP B CORE AB: NEGATIVE

## 2024-03-21 LAB — CBC
HCT: 23.1 % — ABNORMAL LOW (ref 39.0–52.0)
Hemoglobin: 7.1 g/dL — ABNORMAL LOW (ref 13.0–17.0)
MCH: 29.3 pg (ref 26.0–34.0)
MCHC: 30.7 g/dL (ref 30.0–36.0)
MCV: 95.5 fL (ref 80.0–100.0)
Platelets: 308 K/uL (ref 150–400)
RBC: 2.42 MIL/uL — ABNORMAL LOW (ref 4.22–5.81)
RDW: 14.6 % (ref 11.5–15.5)
WBC: 12.2 K/uL — ABNORMAL HIGH (ref 4.0–10.5)
nRBC: 0 % (ref 0.0–0.2)

## 2024-03-21 LAB — HEPATITIS C ANTIBODY: HCV Ab: REACTIVE — AB

## 2024-03-21 LAB — HEPATITIS B SURFACE ANTIGEN

## 2024-03-21 LAB — HEPATITIS B SURFACE ANTIBODY, QUANTITATIVE: Hep B S AB Quant (Post): <3.5 m[IU]/mL — ABNORMAL LOW

## 2024-03-21 MED ORDER — MELATONIN 3 MG PO TABS
3.0000 mg | ORAL_TABLET | Freq: Every day | ORAL | Status: DC
  Administered 2024-03-21 – 2024-03-24 (×4): 3 mg via ORAL
  Filled 2024-03-21 (×4): qty 1

## 2024-03-21 NOTE — Progress Notes (Signed)
 Mobility Specialist Progress Note:    03/21/24 1230  Mobility  Activity Ambulated with assistance  Level of Assistance Modified independent, requires aide device or extra time  Assistive Device Front wheel walker  Distance Ambulated (ft) 300 ft  Range of Motion/Exercises Active;All extremities  Activity Response Tolerated well  Mobility Referral Yes  Mobility visit 1 Mobility  Mobility Specialist Start Time (ACUTE ONLY) 1230  Mobility Specialist Stop Time (ACUTE ONLY) 1250  Mobility Specialist Time Calculation (min) (ACUTE ONLY) 20 min   Pt received in bed, agreeable to mobility. ModI to stand and ambulate with RW. Tolerated well, HR 126 at rest. HR 142 at EOS, returned supine. All needs met.  Rumaysa Sabatino Mobility Specialist Please contact via Special educational needs teacher or  Rehab office at [phone]

## 2024-03-21 NOTE — Progress Notes (Signed)
 Progress Note   Patient: [name] [name] FMW:[id number] DOB: Nov 26, 1975 DOA: 03/14/2024     6 DOS: the patient was seen and examined on 03/21/2024   Brief hospital course: MACARIO SHEAR is a 48 y.o. male with medical history significant of anxiety disorder, tobacco abuse, chronic back pain who presents to the emergency department from home due to generalized weakness and poor oral intake. Patient complained of hurting his back about 5 weeks ago and he has been laying in bed most of the time for about a month.  He believed that he aggravated the back from sleeping in an uncomfortable position.  He denied any falls. For about a week now, wife at bedside reports poor oral intake but has been tolerating liquids without any difficulty.  He denies dysphagia.  But having productive cough of rusty colored sputum and subjective fever within the last few days. Patient denies any IV drug use in the last 6 months, alcohol use in past few months and cigarettes use in the past few days.   ED Course:  Patient was tachypneic, tachycardic, but other vital signs are within normal range.  Workup in the ED showed normocytic anemia, thrombocytopenia.  BMP shows sodium 133, potassium 4.8, chloride 98, bicarb 21, blood glucose 129, BUN 46, creatinine 1.45 (last lab for comparison was creatinine of 0.88 and this was 7 years ago), albumin 2.7, AST 48, ALT 23, ALP 362, total bilirubin 1.6, lactic acid 2.5 > 1.7.  TSH 1.730, ammonia 28.  Blood culture pending CT abdomen pelvis showed small cavitary lung base nodules, most likely due to multifocal septic lung emboli, most of them continue fluid compatible with these being abscesses.  Small pericardial effusion. Chest x-ray showed patchy nodular airspace opacities in both lungs, concerning for infection Patient was treated with IV ceftriaxone  and azithromycin  were given for pneumonia, cefepime and vancomycin  were given for osteomyelitis of the spine. morphine  was given, and  Zofran .     Principal Problem:   Sepsis due to pneumonia [facility]) Active Problems:   Septic embolism (HCC)   Pressure injury of skin   Lactic acidosis   Osteomyelitis of lumbar spine (HCC)   Thrombocytopenia   Transaminasemia   Hypoalbuminemia   Failure to thrive in adult  Assessment and Plan: Sepsis due to pneumonia/ small Septic embolism Osteomyelitis of the lumbar spine Staph aureus bacteremia- POA: Patient met sepsis criteria due to being tachycardic, tachypneic -   Chest x-ray was suggestive of infection and CT abdomen and pelvis was suggestive of septic embolism.  IV hydration per sepsis protocol was provided Was started on IV ceftriaxone  and azithromycin  in ED which were broadened to cefepime and vancomycin  therapy after consulting ID, reviewed blood cultures.  CT abdomen and pelvis was suggestive of discitis/osteomyelitis of posttraumatic change. MRI of lumbar spine: There is discitis/osteomyelitis at L3-L4 and L5-S1 with a septic right facet joint at L5-S1. There is paraspinal inflammation with small bilateral psoas abscesses. The bone marrow signal is diffusely abnormal, possibly due to anemia.   ID evaluated the patient. Blood cultures grew Staph aureus MSSA.  Vancomycin , cefepime stopped. Sepsis pathology resolved, echocardiogram unremarkable for valvular vegetation, TEE deferred at this time.  Continue Ancef  therapy for total 4 weeks per ID recommendations if repeat cultures remain negative. Repeat MRI L spine after abx completion.  Continue IV Dilaudid  0.5 mg every 3 hours as needed for severe pain.  Percocet 5/ 325 mg Q4 hourly for moderate pain. Advised oral pain meds as possible. Continue fall  precaution. PT OT follow-up.  Encourage out of bed.   Thrombocytopenia Transaminitis In the setting of hepatic steatosis. Platelets, LFTs improved.  Acute on chronic anemia: Patient's hemoglobin dropped to 7.1. no actively bleeding. Monitor H/H. Transfuse for Hb less than  7. Anemia panel shows - Iron levels low, folate levels low. Iron and folate acid supplements ordered.  Hypoalbuminemia possibly due to moderate protein calorie malnutrition Failure to thrive in adult Albumin 2.2, protein supplement will be provided Encourage soft diet and supplements.   Pressure ulcer to sacrum-stage II WOC dressing changes as ordered.      Out of bed to chair. Incentive spirometry. Nursing supportive care. Fall, aspiration precautions. Diet:  Diet Orders (From admission, onward)     Start     Ordered   03/17/24 1338  DIET SOFT Room service appropriate? Yes; Fluid consistency: Thin  Diet effective now       Question Answer Comment  Room service appropriate? Yes   Fluid consistency: Thin      03/17/24 1337           DVT prophylaxis: SCDs Start: 03/15/24 0330  Level of care: Telemetry   Code Status: Full Code  Subjective: Patient is seen and examined today morning.  He denies any complaints. Pain better, did have a bowel movement. Advised to be out of bed, work with PT.  Physical Exam: Vitals:   03/20/24 0312 03/20/24 1246 03/20/24 2131 03/21/24 0330  BP: (!) 138/95 109/63 120/81 128/75  Pulse:  74 (!) 106 (!) 117  Resp: 18  18 18   Temp: 98.6 F (37 C) 98.2 F (36.8 C) 99.1 F (37.3 C) 100.2 F (37.9 C)  TempSrc: Oral Oral Oral Oral  SpO2: 98% 96% 93% 92%  Weight:      Height:        General - Young Caucasian weak male, no apparent distress HEENT - PERRLA, EOMI, atraumatic head, non tender sinuses. Lung - Clear, basal rales, no rhonchi, wheezes. Heart - S1, S2 heard, no murmurs, rubs, trace pedal edema. Abdomen - Soft, non tender, bowel sounds good Neuro - Alert, awake able to answer, follow simple commands, non focal exam. Skin - Warm and dry.  Data Reviewed:      Latest Ref Rng & Units 03/21/2024    5:03 AM 03/18/2024    4:19 AM 03/17/2024    4:26 AM  CBC  WBC 4.0 - 10.5 K/uL 12.2  9.7  10.0   Hemoglobin 13.0 - 17.0 g/dL 7.1   8.3  7.7   Hematocrit 39.0 - 52.0 % 23.1  26.5  24.2   Platelets 150 - 400 K/uL 308  213  162       Latest Ref Rng & Units 03/21/2024    5:03 AM 03/18/2024    4:19 AM 03/17/2024    4:26 AM  BMP  Glucose 70 - 99 mg/dL 891  99  898   BUN 6 - 20 mg/dL 20  33  35   Creatinine 0.61 - 1.24 mg/dL 9.09  8.99  9.05   Sodium 135 - 145 mmol/L 133  138  137   Potassium 3.5 - 5.1 mmol/L 4.1  4.6  4.3   Chloride 98 - 111 mmol/L 102  106  107   CO2 22 - 32 mmol/L 22  22  21    Calcium 8.9 - 10.3 mg/dL 7.9  8.3  8.1    No results found.   Family Communication: Discussed with patient, understand and agree.  All questions answered.  Disposition: Status is: Inpatient Remains inpatient appropriate because: Long-term antibiotic therapy.  Not a good OPAT candidate.  May need to stay in the hospital until antibiotics finished. Discharge Destination: Home with Home Health     Time spent: 43 minutes  Author: Concepcion Riser, MD 03/21/2024 1:12 PM Secure chat 7am to 7pm For on call review [email or web address].

## 2024-03-21 NOTE — Plan of Care (Signed)

## 2024-03-21 NOTE — Plan of Care (Signed)
  Problem: Education: Goal: Knowledge of General Education information will improve Description: Including pain rating scale, medication(s)/side effects and non-pharmacologic comfort measures Outcome: Progressing   Problem: Clinical Measurements: Goal: Ability to maintain clinical measurements within normal limits will improve Outcome: Progressing Goal: Diagnostic test results will improve Outcome: Progressing Goal: Respiratory complications will improve Outcome: Progressing Goal: Cardiovascular complication will be avoided Outcome: Progressing   Problem: Activity: Goal: Risk for activity intolerance will decrease Outcome: Progressing   Problem: Nutrition: Goal: Adequate nutrition will be maintained Outcome: Progressing   Problem: Coping: Goal: Level of anxiety will decrease Outcome: Progressing   Problem: Elimination: Goal: Will not experience complications related to bowel motility Outcome: Progressing Goal: Will not experience complications related to urinary retention Outcome: Progressing   Problem: Pain Managment: Goal: General experience of comfort will improve and/or be controlled Outcome: Progressing   Problem: Safety: Goal: Ability to remain free from injury will improve Outcome: Progressing   Problem: Activity: Goal: Ability to tolerate increased activity will improve Outcome: Progressing   Problem: Clinical Measurements: Goal: Ability to maintain a body temperature in the normal range will improve Outcome: Progressing   Problem: Respiratory: Goal: Ability to maintain adequate ventilation will improve Outcome: Progressing Goal: Ability to maintain a clear airway will improve Outcome: Progressing

## 2024-03-21 NOTE — Progress Notes (Signed)
 Pt having anxiety off an on throughout the shift, pt also running low grade fevers (100.1-100.4) tylenol  administered.

## 2024-03-22 DIAGNOSIS — E43 Unspecified severe protein-calorie malnutrition: Secondary | ICD-10-CM | POA: Insufficient documentation

## 2024-03-22 LAB — HCV RNA QUANT: HCV Quantitative: NOT DETECTED [IU]/mL (ref >50)

## 2024-03-22 LAB — COMPREHENSIVE METABOLIC PANEL WITH GFR
ALT: 12 U/L (ref 0–44)
AST: 44 U/L — ABNORMAL HIGH (ref 15–41)
Albumin: 2.7 g/dL — ABNORMAL LOW (ref 3.5–5.0)
Alkaline Phosphatase: 255 U/L — ABNORMAL HIGH (ref 38–126)
Anion gap: 8 (ref 5–15)
BUN: 17 mg/dL (ref 6–20)
CO2: 23 mmol/L (ref 22–32)
Calcium: 8.5 mg/dL — ABNORMAL LOW (ref 8.9–10.3)
Chloride: 100 mmol/L (ref 98–111)
Creatinine, Ser: 0.8 mg/dL (ref 0.61–1.24)
GFR, Estimated: >60 mL/min (ref >60)
Glucose, Bld: 102 mg/dL — ABNORMAL HIGH (ref 70–99)
Potassium: 4 mmol/L (ref 3.5–5.1)
Sodium: 131 mmol/L — ABNORMAL LOW (ref 135–145)
Total Bilirubin: 0.7 mg/dL (ref 0.0–1.2)
Total Protein: 7.8 g/dL (ref 6.5–8.1)

## 2024-03-22 LAB — CBC
HCT: 28.4 % — ABNORMAL LOW (ref 39.0–52.0)
Hemoglobin: 8.8 g/dL — ABNORMAL LOW (ref 13.0–17.0)
MCH: 29.3 pg (ref 26.0–34.0)
MCHC: 31 g/dL (ref 30.0–36.0)
MCV: 94.7 fL (ref 80.0–100.0)
Platelets: 424 K/uL — ABNORMAL HIGH (ref 150–400)
RBC: 3 MIL/uL — ABNORMAL LOW (ref 4.22–5.81)
RDW: 14.9 % (ref 11.5–15.5)
WBC: 14.4 K/uL — ABNORMAL HIGH (ref 4.0–10.5)
nRBC: 0 % (ref 0.0–0.2)

## 2024-03-22 NOTE — Progress Notes (Signed)
 Mobility Specialist Progress Note:    03/22/24 1250  Mobility  Activity Ambulated with assistance  Level of Assistance Contact guard assist, steadying assist  Assistive Device None  Distance Ambulated (ft) 400 ft  Range of Motion/Exercises Active;All extremities  Activity Response Tolerated well  Mobility Referral Yes  Mobility visit 1 Mobility  Mobility Specialist Start Time (ACUTE ONLY) 1250  Mobility Specialist Stop Time (ACUTE ONLY) 1310  Mobility Specialist Time Calculation (min) (ACUTE ONLY) 20 min   Pt received in bed, agreeable to mobility. Required CGA to stand and ambulate with no AD. Tolerated well, HR 109 before session. HR max 150 during ambulation, left sitting EOB. NT in room, all needs met.  [name] [name] Mobility Specialist Please contact via Special educational needs teacher or  Rehab office at [phone]

## 2024-03-22 NOTE — Plan of Care (Signed)
  Problem: Education: Goal: Knowledge of General Education information will improve Description: Including pain rating scale, medication(s)/side effects and non-pharmacologic comfort measures Outcome: Progressing   Problem: Clinical Measurements: Goal: Ability to maintain clinical measurements within normal limits will improve Outcome: Progressing Goal: Will remain free from infection Outcome: Progressing Goal: Diagnostic test results will improve Outcome: Progressing Goal: Respiratory complications will improve Outcome: Progressing Goal: Cardiovascular complication will be avoided Outcome: Progressing   Problem: Activity: Goal: Risk for activity intolerance will decrease Outcome: Progressing   Problem: Nutrition: Goal: Adequate nutrition will be maintained Outcome: Progressing   Problem: Coping: Goal: Level of anxiety will decrease Outcome: Progressing   Problem: Elimination: Goal: Will not experience complications related to bowel motility Outcome: Progressing Goal: Will not experience complications related to urinary retention Outcome: Progressing   Problem: Activity: Goal: Ability to tolerate increased activity will improve Outcome: Progressing   Problem: Clinical Measurements: Goal: Ability to maintain a body temperature in the normal range will improve Outcome: Progressing

## 2024-03-22 NOTE — Progress Notes (Signed)
 Progress Note   Patient: [name] [name] FMW:[id number] DOB: 1975/10/27 DOA: 03/14/2024     7 DOS: the patient was seen and examined on 03/22/2024   Brief [name] course: [name] [name] is a 48 y.o. male with medical history significant of anxiety disorder, tobacco abuse, chronic back pain who presents to the emergency department from home due to generalized weakness and poor oral intake. Patient complained of hurting his back about 5 weeks ago and he has been laying in bed most of the time for about a month.  He believed that he aggravated the back from sleeping in an uncomfortable position.  He denied any falls. For about a week now, wife at bedside reports poor oral intake but has been tolerating liquids without any difficulty.  He denies dysphagia.  But having productive cough of rusty colored sputum and subjective fever within the last few days. Patient denies any IV drug use in the last 6 months, alcohol use in past few months and cigarettes use in the past few days.   ED Course:  Patient was tachypneic, tachycardic, but other vital signs are within normal range.  Workup in the ED showed normocytic anemia, thrombocytopenia.  BMP shows sodium 133, potassium 4.8, chloride 98, bicarb 21, blood glucose 129, BUN 46, creatinine 1.45 (last lab for comparison was creatinine of 0.88 and this was 7 years ago), albumin 2.7, AST 48, ALT 23, ALP 362, total bilirubin 1.6, lactic acid 2.5 > 1.7.  TSH 1.730, ammonia 28.  Blood culture pending CT abdomen pelvis showed small cavitary lung base nodules, most likely due to multifocal septic lung emboli, most of them continue fluid compatible with these being abscesses.  Small pericardial effusion. Chest x-ray showed patchy nodular airspace opacities in both lungs, concerning for infection Patient was treated with IV ceftriaxone  and azithromycin  were given for pneumonia, cefepime and vancomycin  were given for osteomyelitis of the spine. morphine  was given, and  Zofran .     Principal Problem:   Sepsis due to pneumonia [name]) Active Problems:   Septic embolism (HCC)   Pressure injury of skin   Lactic acidosis   Osteomyelitis of lumbar spine (HCC)   Thrombocytopenia   Transaminasemia   Hypoalbuminemia   Failure to thrive in adult  Assessment and Plan: Sepsis due to pneumonia/ small Septic embolism Osteomyelitis of the lumbar spine Staph aureus bacteremia- POA: Patient met sepsis criteria due to being tachycardic, tachypneic -   Chest x-ray was suggestive of infection and CT abdomen and pelvis was suggestive of septic embolism.  IV hydration per sepsis protocol was provided Was started on IV ceftriaxone  and azithromycin  in ED which were broadened to cefepime and vancomycin  therapy after consulting ID, reviewed blood cultures.  CT abdomen and pelvis was suggestive of discitis/osteomyelitis of posttraumatic change. MRI of lumbar spine: There is discitis/osteomyelitis at L3-L4 and L5-S1 with a septic right facet joint at L5-S1. There is paraspinal inflammation with small bilateral psoas abscesses. The bone marrow signal is diffusely abnormal, possibly due to anemia.   ID evaluated the patient. Blood cultures grew Staph aureus MSSA.  Vancomycin , cefepime stopped. Sepsis pathology resolved, echocardiogram unremarkable for valvular vegetation, TEE deferred at this time.  Continue Ancef  therapy for total 4 weeks per ID recommendations if repeat cultures remain negative. IV Ancef  end of therapy 04/13/24. Repeat MRI L spine after abx completion.  Continue IV Dilaudid  0.5 mg every 3 hours as needed for severe pain.  Percocet 5/ 325 mg Q4 hourly for moderate pain. Advised oral  pain meds as possible. Continue fall precaution. PT OT follow-up.  Encourage out of bed.   Thrombocytopenia Transaminitis In the setting of hepatic steatosis. Platelets, LFTs improved.  Acute on chronic anemia: Patient's hemoglobin dropped to 7.1. no actively bleeding. Monitor  H/H. Transfuse for Hb less than 7. Anemia panel shows - Iron levels low, folate levels low. Iron and folate acid supplements ordered.  Hypoalbuminemia possibly due to moderate protein calorie malnutrition Failure to thrive in adult Albumin 2.2, protein supplement will be provided Encourage soft diet and supplements.   Pressure ulcer to sacrum-stage II WOC dressing changes as ordered.  Severe protein malnutrition- Encourage oral diet, supplements.      Out of bed to chair. Incentive spirometry. Nursing supportive care. Fall, aspiration precautions. Diet:  Diet Orders (From admission, onward)     Start     Ordered   03/17/24 1338  DIET SOFT Room service appropriate? Yes; Fluid consistency: Thin  Diet effective now       Question Answer Comment  Room service appropriate? Yes   Fluid consistency: Thin      03/17/24 1337           DVT prophylaxis: SCDs Start: 03/15/24 0330  Level of care: Telemetry   Code Status: Full Code  Subjective: Patient is seen and examined today morning.  He denies any complaints. Pain better, did have a bowel movement. Advised to be out of bed, work with PT.  Physical Exam: Vitals:   03/21/24 2010 03/22/24 0428 03/22/24 1123 03/22/24 1308  BP: 128/80 127/89 134/88 (!) 122/92  Pulse: (!) 101 (!) 117 (!) 117 (!) 126  Resp: 15 20 18 17   Temp: 99.5 F (37.5 C) 98.4 F (36.9 C) 99.8 F (37.7 C) 99.5 F (37.5 C)  TempSrc: Oral Oral Oral Oral  SpO2: 94% 99% 97% 96%  Weight:      Height:        General - Young Caucasian weak male, no apparent distress HEENT - PERRLA, EOMI, atraumatic head, non tender sinuses. Lung - Clear, basal rales, no rhonchi, wheezes. Heart - S1, S2 heard, no murmurs, rubs, trace pedal edema. Abdomen - Soft, non tender, bowel sounds good Neuro - Alert, awake able to answer, follow simple commands, non focal exam. Skin - Warm and dry.  Data Reviewed:      Latest Ref Rng & Units 03/22/2024    4:40 AM  03/21/2024    5:03 AM 03/18/2024    4:19 AM  CBC  WBC 4.0 - 10.5 K/uL 14.4  12.2  9.7   Hemoglobin 13.0 - 17.0 g/dL 8.8  7.1  8.3   Hematocrit 39.0 - 52.0 % 28.4  23.1  26.5   Platelets 150 - 400 K/uL 424  308  213       Latest Ref Rng & Units 03/22/2024    4:40 AM 03/21/2024    5:03 AM 03/18/2024    4:19 AM  BMP  Glucose 70 - 99 mg/dL 897  891  99   BUN 6 - 20 mg/dL 17  20  33   Creatinine 0.61 - 1.24 mg/dL 9.19  9.09  8.99   Sodium 135 - 145 mmol/L 131  133  138   Potassium 3.5 - 5.1 mmol/L 4.0  4.1  4.6   Chloride 98 - 111 mmol/L 100  102  106   CO2 22 - 32 mmol/L 23  22  22    Calcium 8.9 - 10.3 mg/dL 8.5  7.9  8.3  No results found.   Family Communication: Discussed with patient, understand and agree. All questions answered.  Disposition: Status is: Inpatient Remains inpatient appropriate because: Long-term antibiotic therapy till 04/13/24.  Not a good OPAT candidate.  May need to stay in the [name] until antibiotics finished. Discharge Destination: Home with Home Health     Time spent: 42 minutes  Author: Concepcion Riser, MD 03/22/2024 3:30 PM Secure chat 7am to 7pm For on call review [email or web address].

## 2024-03-22 NOTE — Progress Notes (Signed)
 PT Cancellation Note  Patient Details Name: [name] [name] MRN: [id number] DOB: [birth year]   Cancelled Treatment:    Reason Eval/Treat Not Completed: Patient declined, no reason specified  Attempted PT session, pt declined due to visitors expected any minute.  Reports he has been up with mobility tech earlier today.  [name], LPTA/CLT; CBIS [phone]  Mclean Augustin [name] 03/22/2024, 4:18 PM

## 2024-03-22 NOTE — Progress Notes (Signed)
 Nutrition Follow-up  DOCUMENTATION CODES:   Severe malnutrition in context of social or environmental circumstances  INTERVENTION:   D/C Ensure per patient request. D/C Prosource per patient request. Try Magic cup TID with meals, each supplement provides 290 kcal and 9 grams of protein. Continue MVI, vitamin C, folic acid , ferrous sulfate, zinc sulfate.  NUTRITION DIAGNOSIS:   Severe Malnutrition related to social / environmental circumstances (polysubstance abuse) as evidenced by severe muscle depletion, severe fat depletion; ongoing.  GOAL:   Patient will meet greater than or equal to 90% of their needs; progressing.  MONITOR:   PO intake, Supplement acceptance  REASON FOR ASSESSMENT:   Consult Assessment of nutrition requirement/status  ASSESSMENT:   Pt with medical history significant of anxiety disorder, tobacco abuse, chronic back pain who presents due to generalized weakness and poor oral intake. He has a hx of IVDU, but none in the past 6 months as well as alcohol abuse within the past few months.  Patient state he is eating fine. He c/o eating poorly for 5 weeks PTA d/t back pain from sleeping in an uncomfortable position. He does not like the Prosource or Ensure supplements d/t GI distress from the antibiotics and supplements. He likes the way the Ensure tastes, but he doesn't like the diarrhea afterwards. He agreed to try magic cups with meals, vanilla and chocolate.   Currently on a soft diet. Meal intakes documented at 0% 10/4-10/5 while on full liquids and 25% of one meal on 10/5 after diet was advanced to soft foods.   Labs reviewed.  Na 131 Iron 24 L Folate 4.3 L  Vitamin B12 591 WNL Vitamin A pending  Medications reviewed and include vitamin C, ferrous sulfate, folic acid , MVI with minerals, protonix, miralax , zinc sulfate, IV ancef .  Patient meets criteria for severe malnutrition, given severe depletion of muscle and subcutaneous fat mass.    NUTRITION - FOCUSED PHYSICAL EXAM:  Flowsheet Row Most Recent Value  Orbital Region Severe depletion  Upper Arm Region Severe depletion  Thoracic and Lumbar Region Severe depletion  Buccal Region Severe depletion  Temple Region Severe depletion  Clavicle Bone Region Severe depletion  Clavicle and Acromion Bone Region Severe depletion  Scapular Bone Region Severe depletion  Dorsal Hand Severe depletion  Patellar Region Severe depletion  Anterior Thigh Region Severe depletion  Posterior Calf Region Severe depletion  Edema (RD Assessment) None  Hair Reviewed  Eyes Reviewed  Mouth Reviewed  Skin Reviewed  Nails Reviewed    Diet Order:   Diet Order             DIET SOFT Room service appropriate? Yes; Fluid consistency: Thin  Diet effective now                   EDUCATION NEEDS:   Education needs have been addressed  Skin:  Skin Assessment: Skin Integrity Issues: Skin Integrity Issues:: DTI DTI: sacrum  Last BM:  10/10 type 6/7  Height:   Ht Readings from Last 1 Encounters:  03/14/24 6' (1.829 m)    Weight:   Wt Readings from Last 1 Encounters:  03/15/24 62.5 kg    Ideal Body Weight:  80.9 kg  BMI:  Body mass index is 18.69 kg/m.  Estimated Nutritional Needs:   Kcal:  2150-2350  Protein:  125-150 grams  Fluid:  2.0-2.2 L   Suzen HUNT RD, LDN, CNSC Contact via secure chat. If unavailable, use group chat RD Inpatient.

## 2024-03-23 LAB — CBC
HCT: 22.8 % — ABNORMAL LOW (ref 39.0–52.0)
Hemoglobin: 7.1 g/dL — ABNORMAL LOW (ref 13.0–17.0)
MCH: 29.5 pg (ref 26.0–34.0)
MCHC: 31.1 g/dL (ref 30.0–36.0)
MCV: 94.6 fL (ref 80.0–100.0)
Platelets: 378 K/uL (ref 150–400)
RBC: 2.41 MIL/uL — ABNORMAL LOW (ref 4.22–5.81)
RDW: 15.2 % (ref 11.5–15.5)
WBC: 10 K/uL (ref 4.0–10.5)
nRBC: 0 % (ref 0.0–0.2)

## 2024-03-23 LAB — COMPREHENSIVE METABOLIC PANEL WITH GFR
ALT: 6 U/L (ref 0–44)
AST: 30 U/L (ref 15–41)
Albumin: 2.1 g/dL — ABNORMAL LOW (ref 3.5–5.0)
Alkaline Phosphatase: 191 U/L — ABNORMAL HIGH (ref 38–126)
Anion gap: 9 (ref 5–15)
BUN: 15 mg/dL (ref 6–20)
CO2: 25 mmol/L (ref 22–32)
Calcium: 8 mg/dL — ABNORMAL LOW (ref 8.9–10.3)
Chloride: 103 mmol/L (ref 98–111)
Creatinine, Ser: 0.82 mg/dL (ref 0.61–1.24)
GFR, Estimated: >60 mL/min (ref >60)
Glucose, Bld: 91 mg/dL (ref 70–99)
Potassium: 4.3 mmol/L (ref 3.5–5.1)
Sodium: 137 mmol/L (ref 135–145)
Total Bilirubin: 0.5 mg/dL (ref 0.0–1.2)
Total Protein: 6.4 g/dL — ABNORMAL LOW (ref 6.5–8.1)

## 2024-03-23 MED ORDER — GUAIFENESIN-DM 100-10 MG/5ML PO SYRP
5.0000 mL | ORAL_SOLUTION | ORAL | Status: DC | PRN
  Administered 2024-03-23 (×2): 5 mL via ORAL
  Filled 2024-03-23 (×2): qty 5

## 2024-03-23 MED ORDER — ALPRAZOLAM 0.5 MG PO TABS
0.5000 mg | ORAL_TABLET | Freq: Once | ORAL | Status: AC | PRN
  Administered 2024-03-24: 0.5 mg via ORAL
  Filled 2024-03-23: qty 1

## 2024-03-23 NOTE — Progress Notes (Signed)
 Progress Note   Patient: [name] [name] FMW:[id number] DOB: [birth year] DOA: 03/14/2024     8 DOS: the patient was seen and examined on 03/23/2024   Brief hospital course: [name] [name] is a 48 y.o. male with medical history significant of anxiety disorder, tobacco abuse, chronic back pain who presents to the emergency department from home due to generalized weakness and poor oral intake. Patient complained of hurting his back about 5 weeks ago and he has been laying in bed most of the time for about a month.  He believed that he aggravated the back from sleeping in an uncomfortable position.  He denied any falls. For about a week now, wife at bedside reports poor oral intake but has been tolerating liquids without any difficulty.  He denies dysphagia.  But having productive cough of rusty colored sputum and subjective fever within the last few days. Patient denies any IV drug use in the last 6 months, alcohol use in past few months and cigarettes use in the past few days.   ED Course:  Patient was tachypneic, tachycardic, but other vital signs are within normal range.  Workup in the ED showed normocytic anemia, thrombocytopenia.  BMP shows sodium 133, potassium 4.8, chloride 98, bicarb 21, blood glucose 129, BUN 46, creatinine 1.45 (last lab for comparison was creatinine of 0.88 and this was 7 years ago), albumin 2.7, AST 48, ALT 23, ALP 362, total bilirubin 1.6, lactic acid 2.5 > 1.7.  TSH 1.730, ammonia 28.  Blood culture pending CT abdomen pelvis showed small cavitary lung base nodules, most likely due to multifocal septic lung emboli, most of them continue fluid compatible with these being abscesses.  Small pericardial effusion. Chest x-ray showed patchy nodular airspace opacities in both lungs, concerning for infection Patient was treated with IV ceftriaxone  and azithromycin  were given for pneumonia, changed to cefepime and vancomycin  were given for osteomyelitis of the spine. morphine  was  given, and Zofran .  ID evaluated him, abx changed to Ancef  for 4 weeks due to MSSA in his blood. He is not OPAT candidate and will need to stay in hospital until 04/13/24.    Principal Problem:   Sepsis due to pneumonia [facility]) Active Problems:   Septic embolism (HCC)   Pressure injury of skin   Lactic acidosis   Osteomyelitis of lumbar spine (HCC)   Thrombocytopenia   Transaminasemia   Hypoalbuminemia   Failure to thrive in adult  Assessment and Plan: Sepsis due to pneumonia/ small Septic embolism Osteomyelitis of the lumbar spine Staph aureus bacteremia- POA: Patient met sepsis criteria due to being tachycardic, tachypneic -   Chest x-ray was suggestive of infection and CT abdomen and pelvis was suggestive of septic embolism.  IV hydration per sepsis protocol was provided Was started on IV ceftriaxone  and azithromycin  in ED which were broadened to cefepime and vancomycin  therapy after consulting ID, reviewed blood cultures.  CT abdomen and pelvis was suggestive of discitis/osteomyelitis of posttraumatic change. MRI of lumbar spine: There is discitis/osteomyelitis at L3-L4 and L5-S1 with a septic right facet joint at L5-S1. There is paraspinal inflammation with small bilateral psoas abscesses. The bone marrow signal is diffusely abnormal, possibly due to anemia.   ID evaluated the patient. Blood cultures grew Staph aureus MSSA.  Vancomycin , cefepime stopped. Sepsis pathology resolved, echocardiogram unremarkable for valvular vegetation, TEE deferred at this time.  Continue Ancef  therapy for total 4 weeks per ID recommendations if repeat cultures remain negative. IV Ancef  end of therapy 04/13/24. Repeat  MRI L spine after abx completion.  Continue IV Dilaudid  0.5 mg every 3 hours as needed for severe pain.  Percocet 5/ 325 mg Q4 hourly for moderate pain. Advised oral pain meds as possible. Continue fall precaution. PT/ OT follow-up.  Encourage out of bed.    Thrombocytopenia Transaminitis In the setting of hepatic steatosis. Platelets, LFTs improved.  Acute on chronic anemia: Patient's hemoglobin dropped to 7.1. no actively bleeding. Monitor H/H. Transfuse for Hb less than 7. Anemia panel shows - Iron levels low, folate levels low. Iron and folate acid supplements ordered.  Severe protein calorie malnutrition Failure to thrive in adult Encourage soft diet and supplements.   Pressure ulcer to sacrum-stage II Continue current WOC dressing changes.  Severe protein malnutrition- Encourage oral diet, supplements.      Out of bed to chair. Incentive spirometry. Nursing supportive care. Fall, aspiration precautions. Diet:  Diet Orders (From admission, onward)     Start     Ordered   03/17/24 1338  DIET SOFT Room service appropriate? Yes; Fluid consistency: Thin  Diet effective now       Question Answer Comment  Room service appropriate? Yes   Fluid consistency: Thin      03/17/24 1337           DVT prophylaxis: SCDs Start: 03/15/24 0330  Level of care: Telemetry   Code Status: Full Code  Subjective: Patient is seen and examined today morning.  Sleeping comfortably. Advised to be out of bed, work with PT.  Physical Exam: Vitals:   03/22/24 1308 03/22/24 1934 03/23/24 0349 03/23/24 1226  BP: (!) 122/92 128/85 123/87 134/86  Pulse: (!) 126 (!) 109 100   Resp: 17 20 19 18   Temp: 99.5 F (37.5 C) 98.3 F (36.8 C) 98.5 F (36.9 C) 99.3 F (37.4 C)  TempSrc: Oral Oral  Oral  SpO2: 96% 99% 94% 95%  Weight:      Height:        General - Young Caucasian weak male, no apparent distress HEENT - PERRLA, EOMI, atraumatic head, non tender sinuses. Lung - Clear, basal rales, no rhonchi, wheezes. Heart - S1, S2 heard, no murmurs, rubs, trace pedal edema. Abdomen - Soft, non tender, bowel sounds good Neuro - Alert, awake able to answer, follow simple commands, non focal exam. Skin - Warm and dry.  Data  Reviewed:      Latest Ref Rng & Units 03/23/2024    2:57 AM 03/22/2024    4:40 AM 03/21/2024    5:03 AM  CBC  WBC 4.0 - 10.5 K/uL 10.0  14.4  12.2   Hemoglobin 13.0 - 17.0 g/dL 7.1  8.8  7.1   Hematocrit 39.0 - 52.0 % 22.8  28.4  23.1   Platelets 150 - 400 K/uL 378  424  308       Latest Ref Rng & Units 03/23/2024    2:57 AM 03/22/2024    4:40 AM 03/21/2024    5:03 AM  BMP  Glucose 70 - 99 mg/dL 91  897  891   BUN 6 - 20 mg/dL 15  17  20    Creatinine 0.61 - 1.24 mg/dL 9.17  9.19  9.09   Sodium 135 - 145 mmol/L 137  131  133   Potassium 3.5 - 5.1 mmol/L 4.3  4.0  4.1   Chloride 98 - 111 mmol/L 103  100  102   CO2 22 - 32 mmol/L 25  23  22  Calcium 8.9 - 10.3 mg/dL 8.0  8.5  7.9    No results found.   Family Communication: Discussed with patient, understand and agree. All questions answered.  Disposition: Status is: Inpatient Remains inpatient appropriate because: Long-term antibiotic therapy till 04/13/24.  Not a good OPAT candidate.  May need to stay in the hospital until antibiotics finished. Discharge Destination: Home with Home Health     Time spent: 40 minutes  Author: Concepcion Riser, MD 03/23/2024 1:09 PM Secure chat 7am to 7pm For on call review [email or web address].

## 2024-03-24 LAB — HEMOGLOBIN AND HEMATOCRIT, BLOOD
HCT: 25.6 % — ABNORMAL LOW (ref 39.0–52.0)
Hemoglobin: 8 g/dL — ABNORMAL LOW (ref 13.0–17.0)

## 2024-03-24 NOTE — Plan of Care (Signed)
  Problem: Education: Goal: Knowledge of General Education information will improve Description: Including pain rating scale, medication(s)/side effects and non-pharmacologic comfort measures Outcome: Progressing   Problem: Clinical Measurements: Goal: Ability to maintain clinical measurements within normal limits will improve Outcome: Progressing   Problem: Activity: Goal: Risk for activity intolerance will decrease Outcome: Progressing   Problem: Coping: Goal: Level of anxiety will decrease Outcome: Progressing   Problem: Pain Managment: Goal: General experience of comfort will improve and/or be controlled Outcome: Progressing   Problem: Safety: Goal: Ability to remain free from injury will improve Outcome: Progressing   Problem: Skin Integrity: Goal: Risk for impaired skin integrity will decrease Outcome: Progressing   Problem: Activity: Goal: Ability to tolerate increased activity will improve Outcome: Progressing   Problem: Respiratory: Goal: Ability to maintain adequate ventilation will improve Outcome: Progressing Goal: Ability to maintain a clear airway will improve Outcome: Progressing

## 2024-03-24 NOTE — Progress Notes (Signed)
 Progress Note   Patient: [name] [name]:[id number] DOB: [birth year] DOA: 03/14/2024     9 DOS: the patient was seen and examined on 03/24/2024   Brief hospital course: [name] [name] is a 48 y.o. male with medical history significant of anxiety disorder, tobacco abuse, chronic back pain who presents to the emergency department from home due to generalized weakness and poor oral intake. Patient complained of hurting his back about 5 weeks ago and he has been laying in bed most of the time for about a month.  He believed that he aggravated the back from sleeping in an uncomfortable position.  He denied any falls. For about a week now, wife at bedside reports poor oral intake but has been tolerating liquids without any difficulty.  He denies dysphagia.  But having productive cough of rusty colored sputum and subjective fever within the last few days. Patient denies any IV drug use in the last 6 months, alcohol use in past few months and cigarettes use in the past few days.   ED Course:  Patient was tachypneic, tachycardic, but other vital signs are within normal range.  Workup in the ED showed normocytic anemia, thrombocytopenia.  BMP shows sodium 133, potassium 4.8, chloride 98, bicarb 21, blood glucose 129, BUN 46, creatinine 1.45 (last lab for comparison was creatinine of 0.88 and this was 7 years ago), albumin 2.7, AST 48, ALT 23, ALP 362, total bilirubin 1.6, lactic acid 2.5 > 1.7.  TSH 1.730, ammonia 28.  Blood culture pending CT abdomen pelvis showed small cavitary lung base nodules, most likely due to multifocal septic lung emboli, most of them continue fluid compatible with these being abscesses.  Small pericardial effusion. Chest x-ray showed patchy nodular airspace opacities in both lungs, concerning for infection Patient was treated with IV ceftriaxone  and azithromycin  were given for pneumonia, changed to cefepime and vancomycin  were given for osteomyelitis of the spine. morphine  was  given, and Zofran .   ID evaluated him, abx changed to Ancef  for 4 weeks due to MSSA in his blood. He is not OPAT candidate and will need to stay in hospital until 04/13/24.    Principal Problem:   Sepsis due to pneumonia [facility]) Active Problems:   Septic embolism (HCC)   Pressure injury of skin   Lactic acidosis   Osteomyelitis of lumbar spine (HCC)   Thrombocytopenia   Transaminasemia   Hypoalbuminemia   Failure to thrive in adult  Assessment and Plan: Sepsis due to pneumonia/ small Septic embolism Osteomyelitis of the lumbar spine Staph aureus bacteremia- POA: Patient met sepsis criteria due to being tachycardic, tachypneic -   Chest x-ray was suggestive of infection and CT abdomen and pelvis was suggestive of septic embolism.  IV hydration per sepsis protocol was provided Was started on IV ceftriaxone  and azithromycin  in ED which were broadened to cefepime and vancomycin  therapy after consulting ID, reviewed blood cultures.  CT abdomen and pelvis was suggestive of discitis/osteomyelitis of posttraumatic change. MRI of lumbar spine: There is discitis/osteomyelitis at L3-L4 and L5-S1 with a septic right facet joint at L5-S1. There is paraspinal inflammation with small bilateral psoas abscesses. The bone marrow signal is diffusely abnormal, possibly due to anemia.   ID evaluated the patient. Blood cultures grew Staph aureus MSSA.  Vancomycin , cefepime stopped. Sepsis pathology resolved, echocardiogram unremarkable for valvular vegetation, TEE deferred at this time.  Continue Ancef  therapy for total 4 weeks per ID recommendations if repeat cultures remain negative. IV Ancef  end of therapy 04/13/24.  Repeat MRI L spine after abx completion.  Continue IV Dilaudid  0.5 mg every 3 hours as needed for severe pain.  Percocet 5/ 325 mg Q4 hourly for moderate pain. Advised oral pain meds as possible. Continue fall precaution. PT/ OT follow-up.  Encourage out of bed.    Thrombocytopenia Transaminitis In the setting of hepatic steatosis. Platelets, LFTs improved.  Acute on chronic anemia: Patient's hemoglobin stable around 7-9 Transfuse for Hb less than 7. Continue Iron and folate acid supplements.  Severe protein calorie malnutrition Failure to thrive in adult Encourage soft diet and supplements.   Pressure ulcer to sacrum-stage II Continue current WOC dressing changes.  Severe protein malnutrition- Encourage oral diet, supplements.      Out of bed to chair. Incentive spirometry. Nursing supportive care. Fall, aspiration precautions. Diet:  Diet Orders (From admission, onward)     Start     Ordered   03/17/24 1338  DIET SOFT Room service appropriate? Yes; Fluid consistency: Thin  Diet effective now       Question Answer Comment  Room service appropriate? Yes   Fluid consistency: Thin      03/17/24 1337           DVT prophylaxis: SCDs Start: 03/15/24 0330  Level of care: Telemetry   Code Status: Full Code  Subjective: Patient is seen and examined today morning. Complains of back pain, advised to get out of bed.   Physical Exam: Vitals:   03/23/24 0349 03/23/24 1226 03/23/24 1958 03/24/24 0332  BP: 123/87 134/86 121/87 129/89  Pulse: 100   (!) 110  Resp: 19 18 20 17   Temp: 98.5 F (36.9 C) 99.3 F (37.4 C) 98.7 F (37.1 C) 98.9 F (37.2 C)  TempSrc:  Oral    SpO2: 94% 95% 96% 94%  Weight:      Height:        General - Young Caucasian weak male, no apparent distress HEENT - PERRLA, EOMI, atraumatic head, non tender sinuses. Lung - Clear, basal rales, no rhonchi, wheezes. Heart - S1, S2 heard, no murmurs, rubs, trace pedal edema. Abdomen - Soft, non tender, bowel sounds good Neuro - Alert, awake able to answer, follow simple commands, non focal exam. Skin - Warm and dry.  Data Reviewed:      Latest Ref Rng & Units 03/23/2024    2:57 AM 03/22/2024    4:40 AM 03/21/2024    5:03 AM  CBC  WBC 4.0 - 10.5  K/uL 10.0  14.4  12.2   Hemoglobin 13.0 - 17.0 g/dL 7.1  8.8  7.1   Hematocrit 39.0 - 52.0 % 22.8  28.4  23.1   Platelets 150 - 400 K/uL 378  424  308       Latest Ref Rng & Units 03/23/2024    2:57 AM 03/22/2024    4:40 AM 03/21/2024    5:03 AM  BMP  Glucose 70 - 99 mg/dL 91  897  891   BUN 6 - 20 mg/dL 15  17  20    Creatinine 0.61 - 1.24 mg/dL 9.17  9.19  9.09   Sodium 135 - 145 mmol/L 137  131  133   Potassium 3.5 - 5.1 mmol/L 4.3  4.0  4.1   Chloride 98 - 111 mmol/L 103  100  102   CO2 22 - 32 mmol/L 25  23  22    Calcium 8.9 - 10.3 mg/dL 8.0  8.5  7.9    No results found.  Family Communication: Discussed with patient, understand and agree. All questions answered.  Disposition: Status is: Inpatient Remains inpatient appropriate because: Long-term antibiotic therapy till 04/13/24.  Not a good OPAT candidate.  May need to stay in the hospital until antibiotics finished. Discharge Destination: Home with Home Health     Time spent: 40 minutes  Author: Concepcion Riser, MD 03/24/2024 7:15 AM Secure chat 7am to 7pm For on call review [email or web address].

## 2024-03-25 DIAGNOSIS — E43 Unspecified severe protein-calorie malnutrition: Secondary | ICD-10-CM

## 2024-03-25 LAB — CBC
HCT: 22.2 % — ABNORMAL LOW (ref 39.0–52.0)
Hemoglobin: 7 g/dL — ABNORMAL LOW (ref 13.0–17.0)
MCH: 30.2 pg (ref 26.0–34.0)
MCHC: 31.5 g/dL (ref 30.0–36.0)
MCV: 95.7 fL (ref 80.0–100.0)
Platelets: 441 K/uL — ABNORMAL HIGH (ref 150–400)
RBC: 2.32 MIL/uL — ABNORMAL LOW (ref 4.22–5.81)
RDW: 15.3 % (ref 11.5–15.5)
WBC: 9.5 K/uL (ref 4.0–10.5)
nRBC: 0 % (ref 0.0–0.2)

## 2024-03-25 LAB — ABO/RH: ABO/RH(D): A POS

## 2024-03-25 LAB — HEMOGLOBIN AND HEMATOCRIT, BLOOD
HCT: 26.6 % — ABNORMAL LOW (ref 39.0–52.0)
Hemoglobin: 7.7 g/dL — ABNORMAL LOW (ref 13.0–17.0)

## 2024-03-25 LAB — VITAMIN A: Vitamin A (Retinoic Acid): 8.1 ug/dL — ABNORMAL LOW (ref 20.1–62.0)

## 2024-03-25 LAB — RESP PANEL BY RT-PCR (RSV, FLU A&B, COVID)  RVPGX2
Influenza A by PCR: NEGATIVE
Influenza B by PCR: NEGATIVE
Resp Syncytial Virus by PCR: NEGATIVE
SARS Coronavirus 2 by RT PCR: NEGATIVE

## 2024-03-25 LAB — PREPARE RBC (CROSSMATCH)

## 2024-03-25 MED ORDER — SODIUM CHLORIDE 0.9% IV SOLUTION: Freq: Once | INTRAVENOUS | Status: AC

## 2024-03-25 MED ORDER — SODIUM CHLORIDE 0.9 % IV SOLN: INTRAVENOUS | Status: DC

## 2024-03-25 MED ORDER — PNEUMOCOCCAL 20-VAL CONJ VACC 0.5 ML IM SUSY: 0.5000 mL | PREFILLED_SYRINGE | INTRAMUSCULAR | Status: DC

## 2024-03-25 NOTE — Progress Notes (Signed)
 Mobility Specialist Progress Note:    03/25/24 0900  Mobility  Activity Ambulated with assistance  Level of Assistance Contact guard assist, steadying assist  Assistive Device None  Distance Ambulated (ft) 280 ft  Range of Motion/Exercises Active;All extremities  Activity Response Tolerated well  Mobility Referral Yes  Mobility visit 1 Mobility  Mobility Specialist Start Time (ACUTE ONLY) 0900  Mobility Specialist Stop Time (ACUTE ONLY) 0920  Mobility Specialist Time Calculation (min) (ACUTE ONLY) 20 min   Pt received in bed, agreeable to mobility. Required CGA to stand and ambulate with no AD. Tolerated well,asx throughout. Left sitting EOB, all needs met.  Cynthea Zachman Mobility Specialist Please contact via Special educational needs teacher or  Rehab office at [phone]

## 2024-03-25 NOTE — Progress Notes (Signed)
 Mobility Specialist Progress Note:    03/25/24 1350  Mobility  Activity Ambulated with assistance  Level of Assistance Modified independent, requires aide device or extra time  Assistive Device Other (Comment) (IV pole)  Distance Ambulated (ft) 300 ft  Range of Motion/Exercises Active;All extremities  Activity Response Tolerated well  Mobility Referral Yes  Mobility visit 1 Mobility  Mobility Specialist Start Time (ACUTE ONLY) 1350  Mobility Specialist Stop Time (ACUTE ONLY) 1410  Mobility Specialist Time Calculation (min) (ACUTE ONLY) 20 min   Pt received in bed, agreeable to mobility. ModI to stand and ambulate using IV pole for support. Tolerated well, asx throughout. Returned supine, all needs met.   Cage Gupton Mobility Specialist Please contact via Special educational needs teacher or  Rehab office at [phone]

## 2024-03-25 NOTE — Progress Notes (Signed)
 Patient and wife observed in verbal disagreement related to finances.Patient refused transfusion of blood and stated he was leaving AMA. This nurse and other staff attempted to educate patient regarding health and try to get him to stay. AMA forms signed by patient. Dr. Darci notifed of the patient's decision to go AMA.

## 2024-03-25 NOTE — Progress Notes (Signed)
 Patient left AMA accompanied by wife and family member. Physician notified that patient has left.

## 2024-03-25 NOTE — Plan of Care (Signed)
   Problem: Education: Goal: Knowledge of General Education information will improve Description: Including pain rating scale, medication(s)/side effects and non-pharmacologic comfort measures Outcome: Progressing   Problem: Nutrition: Goal: Adequate nutrition will be maintained Outcome: Progressing   Problem: Safety: Goal: Ability to remain free from injury will improve Outcome: Progressing

## 2024-03-25 NOTE — Discharge Summary (Signed)
 Physician AMA Discharge Summary   Patient: [name] [name] MRN: [id number] DOB: Sep 15, 1975  Admit date:     03/14/2024  Discharge date: 03/25/2024  Discharge Physician: Concepcion Riser   PCP: Bertell Satterfield, MD   Recommendations at discharge:  {Tip this will not be part of the note when signed- Example include specific recommendations for outpatient follow-up, pending tests to follow-up on. (Optional):26781}  ***  Discharge Diagnoses: Principal Problem:   Sepsis due to pneumonia [facility]) Active Problems:   Septic embolism (HCC)   Pressure injury of skin   Lactic acidosis   Osteomyelitis of lumbar spine (HCC)   Thrombocytopenia   Transaminasemia   Hypoalbuminemia   Failure to thrive in adult   Protein-calorie malnutrition, severe  Resolved Problems:   * No resolved hospital problems. *  Hospital Course: [name] [name] is a 48 y.o. male with medical history significant of anxiety disorder, tobacco abuse, chronic back pain who presents to the emergency department from home due to generalized weakness and poor oral intake. Patient complained of hurting his back about 5 weeks ago and he has been laying in bed most of the time for about a month.  He believed that he aggravated the back from sleeping in an uncomfortable position.  He denied any falls. For about a week now, wife at bedside reports poor oral intake but has been tolerating liquids without any difficulty.  He denies dysphagia.  But having productive cough of rusty colored sputum and subjective fever within the last few days. Patient denies any IV drug use in the last 6 months, alcohol use in past few months and cigarettes use in the past few days.   ED Course:  Patient was tachypneic, tachycardic, but other vital signs are within normal range.  Workup in the ED showed normocytic anemia, thrombocytopenia.  BMP shows sodium 133, potassium 4.8, chloride 98, bicarb 21, blood glucose 129, BUN 46, creatinine 1.45 (last lab  for comparison was creatinine of 0.88 and this was 7 years ago), albumin 2.7, AST 48, ALT 23, ALP 362, total bilirubin 1.6, lactic acid 2.5 > 1.7.  TSH 1.730, ammonia 28.  Blood culture pending CT abdomen pelvis showed small cavitary lung base nodules, most likely due to multifocal septic lung emboli, most of them continue fluid compatible with these being abscesses.  Small pericardial effusion. Chest x-ray showed patchy nodular airspace opacities in both lungs, concerning for infection Patient was treated with IV ceftriaxone  and azithromycin  were given for pneumonia, cefepime and vancomycin  were given for osteomyelitis of the spine. morphine  was given, and Zofran .        Assessment & Plan:   Principal Problem:   Sepsis due to pneumonia [facility]) Active Problems:   Septic embolism (HCC)   Pressure injury of skin   Lactic acidosis   Osteomyelitis of lumbar spine (HCC)   Thrombocytopenia   Transaminasemia   Hypoalbuminemia   Failure to thrive in adult   Sepsis due to pneumonia/ small Septic embolism /possible bacteremia Still tachycardic with heart rate of 111, blood pressure soft but stable 107/67 Otherwise hemodynamically stable Pulmonary blood cultures reporting gram-positive cocci  POA: Patient met sepsis criteria due to being tachycardic, tachypneic -   Chest x-ray was suggestive of infection and CT abdomen and pelvis was suggestive of septic embolism  -  IV hydration per sepsis protocol was provided -Was started on IV ceftriaxone  and azithromycin  in ED. Will Broaden antibiotics to  Cefepime and Vancomycin   Preliminary bronc cultures gram-positive cocci >>  Will follow-up with sputum culture, urine Legionella, strep pneumo and procalcitonin 3.61 -Lactic acid 2.5>>> 1.7 Continue Tylenol  as needed Continue Mucinex , incentive spirometry, flutter valve    Lactic acidosis secondary to above -resolved Lactic acid 2.5 > 1.7   Osteomyelitis of the lumbar spine CT abdomen and  pelvis was suggestive of discitis/osteomyelitis of posttraumatic change Patient was empirically started on IV vancomycin  and Cefepime,  -- Consulted ID -appreciate input from Dr.Vu   -Cultures growing gram-positive cocci>> continue Vanco + cefepime  Continue IV Dilaudid  0.5 mg every 3 hours as needed Continue fall precaution Continue PT/OT eval and treat  MRI of lumbar spine: There is discitis/osteomyelitis at L3-L4 and L5-S1 with a septic right facet joint at L5-S1. There is paraspinal inflammation with small bilateral psoas abscesses.   The bone marrow signal is diffusely abnormal, possibly due to anemia.   Consulting ID/and neurosurgery-appreciate input1   Thrombocytopenia/  Transaminasemia POA: Platelets 104.  AST 48, ALT 23, ALP 362    Latest Ref Rng & Units 03/15/2024    4:11 AM 03/14/2024    7:49 PM 11/08/2016    4:55 AM  Hepatic Function  Total Protein 6.5 - 8.1 g/dL 6.3  7.7  6.6   Albumin 3.5 - 5.0 g/dL 2.3  2.7  3.0   AST 15 - 41 U/L 37  48  34   ALT 0 - 44 U/L 20  23  22    Alk Phosphatase 38 - 126 U/L 271  362  41   Total Bilirubin 0.0 - 1.2 mg/dL 1.0  1.6  0.9    CT abdomen and pelvis showed mild hepatic steatosis with portal spinal megaly and increased prominence of the hepatic portal vein and splenic vein    Hypoalbuminemia possibly due to moderate protein calorie malnutrition   Failure to thrive in adult Albumin 2.7, protein supplement will be provided Continue full liquid diet with plan to advance diet as tolerated Dietitian will be consulted and we shall await further recommendations   Pressure ulcer to sacrum-stage II Wound nurse will be consulted  Assessment and Plan: No notes have been filed under this hospital service. Service: Hospitalist     {Tip this will not be part of the note when signed Body mass index is 18.69 kg/m. ,  Nutrition Documentation    Flowsheet Row ED to Hosp-Admission (Discharged) from 03/14/2024 in [facility]  Nutrition Problem Severe Malnutrition  Etiology social / environmental circumstances  [polysubstance abuse]  Nutrition Goal Patient will meet greater than or equal to 90% of their needs  Interventions Magic cup, MVI  ,  Active Pressure Injury/Wound(s)     None          (Optional):26781}  {(NOTE) Pain control PDMP Statment (Optional):26782} Consultants: *** Procedures performed: ***  Disposition: {Plan; Disposition:26390} Diet recommendation:  {Diet_Plan:26776} DISCHARGE MEDICATION: Allergies as of 03/25/2024       Reactions   Penicillins Other (See Comments)   Unknown reaction     Med Rec must be completed prior to using this [facility]***       Discharge Exam: Filed Weights   03/15/24 0150  Weight: 62.5 kg   ***  Condition at discharge: {DC Condition:26389}  The results of significant diagnostics from this hospitalization (including imaging, microbiology, ancillary and laboratory) are listed below for reference.   Imaging Studies: ECHOCARDIOGRAM COMPLETE Result Date: 03/16/2024    ECHOCARDIOGRAM REPORT   Patient Name:   RYOSUKE ERICKSEN [facility] Date of Exam: 03/15/2024 Medical  Rec #:  [id number]        Height:       72.0 in Accession #:    [id number]       Weight:       137.8 lb Date of Birth:  Nov 08, 1975        BSA:          1.819 m Patient Age:    48 years         BP:           100/66 mmHg Patient Gender: M                HR:           84 bpm. Exam Location:  [facility] Procedure: 2D Echo, Cardiac Doppler and Color Doppler (Both Spectral and Color            Flow Doppler were utilized during procedure). Indications:    Endocarditis l38  History:        Patient has prior history of Echocardiogram examinations, most                 recent 01/21/2014. Risk Factors:Current Smoker and Hypertension.                 Sepsis due to pneumonia [facility]), Septic embolism (HCC).  Sonographer:    Aida Pizza RCS Referring Phys: [id number] TRUNG T VU IMPRESSIONS  1. Left  ventricular ejection fraction, by estimation, is 60 to 65%. The left ventricle has normal function. The left ventricle has no regional wall motion abnormalities. Left ventricular diastolic parameters were normal.  2. Right ventricular systolic function is normal. The right ventricular size is normal. Tricuspid regurgitation signal is inadequate for assessing PA pressure.  3. Left atrial size was mildly dilated.  4. The mitral valve is normal in structure. No evidence of mitral valve regurgitation. No evidence of mitral stenosis.  5. The aortic valve is tricuspid. Aortic valve regurgitation is not visualized. No aortic stenosis is present.  6. The inferior vena cava is normal in size with greater than 50% respiratory variability, suggesting right atrial pressure of 3 mmHg. Comparison(s): No prior Echocardiogram. Conclusion(s)/Recommendation(s): No evidence of valvular vegetations on this transthoracic echocardiogram. Consider a transesophageal echocardiogram to exclude infective endocarditis if clinically indicated. FINDINGS  Left Ventricle: Left ventricular ejection fraction, by estimation, is 60 to 65%. The left ventricle has normal function. The left ventricle has no regional wall motion abnormalities. Strain was performed and the global longitudinal strain is indeterminate. The left ventricular internal cavity size was normal in size. There is no left ventricular hypertrophy. Left ventricular diastolic parameters were normal. Right Ventricle: The right ventricular size is normal. No increase in right ventricular wall thickness. Right ventricular systolic function is normal. Tricuspid regurgitation signal is inadequate for assessing PA pressure. Left Atrium: Left atrial size was mildly dilated. Right Atrium: Right atrial size was normal in size. Pericardium: There is no evidence of pericardial effusion. Mitral Valve: The mitral valve is normal in structure. No evidence of mitral valve regurgitation. No evidence of  mitral valve stenosis. Tricuspid Valve: The tricuspid valve is normal in structure. Tricuspid valve regurgitation is trivial. No evidence of tricuspid stenosis. Aortic Valve: The aortic valve is tricuspid. Aortic valve regurgitation is not visualized. No aortic stenosis is present. Pulmonic Valve: The pulmonic valve was not well visualized. Pulmonic valve regurgitation is not visualized. No evidence of pulmonic stenosis. Aorta: The aortic root is normal in size and structure. Venous:  The inferior vena cava is normal in size with greater than 50% respiratory variability, suggesting right atrial pressure of 3 mmHg. IAS/Shunts: No atrial level shunt detected by color flow Doppler. Additional Comments: 3D was performed not requiring image post processing on an independent workstation and was indeterminate.  LEFT VENTRICLE PLAX 2D LVIDd:         5.30 cm   Diastology LVIDs:         3.70 cm   LV e' medial:    11.30 cm/s LV PW:         1.10 cm   LV E/e' medial:  7.6 LV IVS:        1.00 cm   LV e' lateral:   14.20 cm/s LVOT diam:     2.20 cm   LV E/e' lateral: 6.1 LV SV:         67 LV SV Index:   37 LVOT Area:     3.80 cm  RIGHT VENTRICLE RV S prime:     22.20 cm/s TAPSE (M-mode): 2.5 cm LEFT ATRIUM           Index        RIGHT ATRIUM           Index LA diam:      3.30 cm 1.81 cm/m   RA Area:     17.10 cm LA Vol (A2C): 35.5 ml 19.52 ml/m  RA Volume:   46.20 ml  25.41 ml/m LA Vol (A4C): 61.8 ml 33.98 ml/m  AORTIC VALVE LVOT Vmax:   81.30 cm/s LVOT Vmean:  44.100 cm/s LVOT VTI:    0.175 m  AORTA Ao Root diam: 3.60 cm MITRAL VALVE MV Area (PHT): 3.03 cm    SHUNTS MV Decel Time: 250 msec    Systemic VTI:  0.18 m MV E velocity: 86.20 cm/s  Systemic Diam: 2.20 cm MV A velocity: 54.60 cm/s MV E/A ratio:  1.58 Vishnu Priya Mallipeddi Electronically signed by Diannah Late Mallipeddi Signature Date/Time: 03/16/2024/9:42:16 AM    Final    MR Lumbar Spine W Wo Contrast Result Date: 03/15/2024 CLINICAL DATA:  Back pain and  weakness EXAM: MRI LUMBAR SPINE WITHOUT AND WITH CONTRAST TECHNIQUE: Multiplanar and multiecho pulse sequences of the lumbar spine were obtained without and with intravenous contrast. CONTRAST:  6mL GADAVIST GADOBUTROL 1 MMOL/ML IV SOLN COMPARISON:  January 16, 2014 FINDINGS: The bone marrow signal is diffusely abnormal with lower than normal signal on the T1 weighted image. There is loss of disc height at L3-L4 with mild edema and enhancement in the vertebral endplates on the left with paraspinal inflammation. There is edema and enhancement of the right-side of the vertebral endplates at L5-S1 with paraspinal inflammation. The right facet joint at L5-S1 is inflamed with enhancement in the adjacent soft tissues and a small abscess posterior to the facet joint. There are bilateral small psoas abscesses. There is no epidural abscess. There is no spinal stenosis. There is pseudoarthrosis/partial fusion of the right transverse process of L5 and the right-side of the sacrum with a chronic signal abnormality in the bone in this location. IMPRESSION: There is discitis/osteomyelitis at L3-L4 and L5-S1 with a septic right facet joint at L5-S1. There is paraspinal inflammation with small bilateral psoas abscesses. The bone marrow signal is diffusely abnormal, possibly due to anemia. A diffuse infiltrative process is possible but considered less likely. Electronically Signed   By: Nancyann Burns M.D.   On: 03/15/2024 11:39   CT ABDOMEN PELVIS W  CONTRAST Result Date: 03/14/2024 CLINICAL DATA:  Suspected sepsis. Low energy x5 weeks worsening in the last week. Infiltrates on chest x-ray. Abdominal pain, acute, nonlocalized with known herniated lumbar disc with recent re-injury per family. EXAM: CT ABDOMEN AND PELVIS WITH CONTRAST CT LUMBAR SPINE WITHOUT CONTRAST TECHNIQUE: Multidetector CT imaging of the abdomen and pelvis was performed using the standard protocol following bolus administration of intravenous contrast.  Multidetector CT imaging of the lumbar spine was performed without the use of contrast. Multiplanar CT image reconstructions were also generated and reviewed. RADIATION DOSE REDUCTION: This exam was performed according to the departmental dose-optimization program which includes automated exposure control, adjustment of the mA and/or kV according to patient size and/or use of iterative reconstruction technique. CONTRAST:  OMNIPAQUE  IOHEXOL  300 MG/ML  SOLN COMPARISON:  CT abdomen and pelvis with contrast 02/02/2015. FINDINGS: CT ABDOMEN AND PELVIS WITH CONTRAST FINDINGS Lower chest: There are numerous diffusely scattered small cavitary lesions both lung bases, seen as nodular infiltrates on chest x-ray. Findings are most likely due to multifocal septic lung emboli, and most of them contain fluid compatible with these being abscesses. The largest of these is in the anteromedial right middle lobe measuring 3.2 cm on the first image. The largest in the right lower lobe is in the anterior basal segment measuring 2.7 cm on 6:8. These are less numerous in the left lung base. The largest left lung base cavity is in the lingular base measuring 2.7 cm on 6:8 and there is patchy airspace disease associated with smaller cavitary foci in the posterior basal left lower lobe. There is a small pericardial effusion. The pericardial fluid is low in density. The cardiac size is normal. Hepatobiliary: The liver is mildly steatotic measuring 21 cm length. There is no mass enhancement or abscess. Gallbladder and bile ducts are unremarkable. Pancreas: No abnormality Spleen: No mass. Splenomegaly. The spleen measures 12.3 x 4.1 x 16 cm. Adrenals/Urinary Tract: there is no adrenal or renal mass enhancement, no urinary stone or obstruction. There is a Bosniak 1 cyst in the superior pole of the right kidney measuring 1.2 cm, 13 Hounsfield units. Smaller Bosniak 2 cortical cyst in the mid pole of this kidney is too small characterize. No  follow-up imaging is recommended. Both kidneys are otherwise unremarkable and secrete symmetrically on the delayed images. The bladder is mildly thickened which could be due to underdistention, hypertrophy or cystitis. Stomach/Bowel: No dilatation. No overt wall thickening. Limited assessment without enteric contrast. Normal caliber appendix. Scattered sigmoid diverticulosis. No evidence of acute diverticulitis. Vascular/Lymphatic: Patchy aortoiliac calcific plaque, age advanced, increased from 9 years ago. Clinical correlation advised. There is a prominent hepatic portal vein measuring 16 mm and increasingly engorged splenic vein. The SMV and tributaries are normal caliber. No adenopathy is seen. Reproductive: No prostatomegaly.  Pelvic phleboliths. Other: Generalized mesenteric congestive changes. No focal inflammatory process is seen through the generalized edema. There is a small volume of pelvic ascites. There is no free hemorrhage, free air or incarcerated hernia. There is mild body wall anasarca. Musculoskeletal: Mild dextroscoliosis, degenerative changes of the lumbar spine, with additional lumbar findings which will be described below. CT LUMBAR SPINE WITHOUT CONTRAST FINDINGS Segmentation: There are 5 lumbar type segments, but L5 is transitional demonstrating right hemisacralization. Alignment: There is mild lumbar dextroscoliosis apex at L1-2. There is a trace, likely discogenic retrolisthesis at L2-3 where there is disc space loss. No other listhesis or further malalignment is seen. Vertebrae: At L2-3 there are small anterior and  posterior peridiscal endplate lucencies with slight fragmentation. At L4-5, there is peridiscal endplate fragmentation and subcortical lucency to the right. Both findings could have an infectious or posttraumatic basis. The patient does relate a history of recent back injury. The findings at L2-3 are less clearly posttraumatic given the location anteriorly and posteriorly. At  L1, there is nondisplaced bony fragmentation of the anterior superior vertebral body without height loss. All vertebrae are grossly normal in heights. This latter finding is almost certainly due to trauma. Other vertebrae are grossly intact. There is mild-to-moderate thoracolumbar spondylosis. Paraspinal soft tissues: There is paraspinal soft tissue thickening at L2-3. This could be due to paraspinal phlegmon or posttraumatic reactive soft tissue change. At L4-5 there is similar paraspinal soft tissue thickening on the right likely on the same etiology. There is no psoas abscess or localizing paraspinal fluid collection. Disc levels: T11-12 through L1-2: Inferior osteophytes. Normal disc heights. No herniations or stenoses. L2-3: Moderate disc space loss greater on the left. Left paracentral to left far lateral disc bulge extends into the left foramen with moderate to severe left foraminal stenosis. There is mild right foraminal stenosis. There is moderate effacement of the left subarticular zone but no significant spinal stenosis, no herniation is seen. L3-4: Normal disc height. Mild diffuse annular bulge. No herniation, canal zone or foraminal zone stenosis. L4-5: Mild disc space loss. Mild diffuse disc bulge without herniation or stenosis except for mild subarticular zone encroachment. The foramina appear clear. L5-S1: No abnormality. Other: Both SI joints are patent with slight spurring. No SI joint erosions. IMPRESSION: 1. Numerous small cavitary lung base nodules, most likely due to multifocal septic lung emboli, most of them containing fluid compatible with these being abscesses. 2. Small pericardial effusion. 3. Generalized mesenteric congestive changes, small volume of pelvic ascites, and mild body wall anasarca. 4. Mild hepatic steatosis with hepatosplenomegaly and increased prominence of the hepatic portal vein and splenic vein. 5. Cystitis versus bladder hypertrophy or underdistention. 6. Age advanced  aortoiliac atherosclerosis. 7. L2-3 disc space loss with small anterior and posterior peridiscal endplate lucencies with slight fragmentation. There is paraspinal soft tissue thickening at this level which could be due to paraspinal phlegmon or posttraumatic reactive soft tissue change. 8. L4-5 peridiscal endplate fragmentation and subcortical lucency to the right, with paraspinal soft tissue thickening on the right. Findings could be due to discitis/osteomyelitis or posttraumatic change. 9. Nondisplaced bony fragmentation of the anterior superior L1 vertebral body without height loss. This is almost certainly due to trauma. 10. L2-3 left paracentral to left far lateral disc bulge with moderate to severe left foraminal stenosis and moderate effacement of the left subarticular zone. 11. L4-5 mild subarticular zone encroachment. 12. Lumbar MRI without and with contrast may be helpful to further characterize these findings. Aortic Atherosclerosis (ICD10-I70.0). Electronically Signed   By: Francis Quam M.D.   On: 03/14/2024 22:24   CT L-SPINE NO CHARGE Result Date: 03/14/2024 CLINICAL DATA:  Suspected sepsis. Low energy x5 weeks worsening in the last week. Infiltrates on chest x-ray. Abdominal pain, acute, nonlocalized with known herniated lumbar disc with recent re-injury per family. EXAM: CT ABDOMEN AND PELVIS WITH CONTRAST CT LUMBAR SPINE WITHOUT CONTRAST TECHNIQUE: Multidetector CT imaging of the abdomen and pelvis was performed using the standard protocol following bolus administration of intravenous contrast. Multidetector CT imaging of the lumbar spine was performed without the use of contrast. Multiplanar CT image reconstructions were also generated and reviewed. RADIATION DOSE REDUCTION: This exam was performed according to  the departmental dose-optimization program which includes automated exposure control, adjustment of the mA and/or kV according to patient size and/or use of iterative reconstruction  technique. CONTRAST:  OMNIPAQUE  IOHEXOL  300 MG/ML  SOLN COMPARISON:  CT abdomen and pelvis with contrast 02/02/2015. FINDINGS: CT ABDOMEN AND PELVIS WITH CONTRAST FINDINGS Lower chest: There are numerous diffusely scattered small cavitary lesions both lung bases, seen as nodular infiltrates on chest x-ray. Findings are most likely due to multifocal septic lung emboli, and most of them contain fluid compatible with these being abscesses. The largest of these is in the anteromedial right middle lobe measuring 3.2 cm on the first image. The largest in the right lower lobe is in the anterior basal segment measuring 2.7 cm on 6:8. These are less numerous in the left lung base. The largest left lung base cavity is in the lingular base measuring 2.7 cm on 6:8 and there is patchy airspace disease associated with smaller cavitary foci in the posterior basal left lower lobe. There is a small pericardial effusion. The pericardial fluid is low in density. The cardiac size is normal. Hepatobiliary: The liver is mildly steatotic measuring 21 cm length. There is no mass enhancement or abscess. Gallbladder and bile ducts are unremarkable. Pancreas: No abnormality Spleen: No mass. Splenomegaly. The spleen measures 12.3 x 4.1 x 16 cm. Adrenals/Urinary Tract: there is no adrenal or renal mass enhancement, no urinary stone or obstruction. There is a Bosniak 1 cyst in the superior pole of the right kidney measuring 1.2 cm, 13 Hounsfield units. Smaller Bosniak 2 cortical cyst in the mid pole of this kidney is too small characterize. No follow-up imaging is recommended. Both kidneys are otherwise unremarkable and secrete symmetrically on the delayed images. The bladder is mildly thickened which could be due to underdistention, hypertrophy or cystitis. Stomach/Bowel: No dilatation. No overt wall thickening. Limited assessment without enteric contrast. Normal caliber appendix. Scattered sigmoid diverticulosis. No evidence of acute  diverticulitis. Vascular/Lymphatic: Patchy aortoiliac calcific plaque, age advanced, increased from 9 years ago. Clinical correlation advised. There is a prominent hepatic portal vein measuring 16 mm and increasingly engorged splenic vein. The SMV and tributaries are normal caliber. No adenopathy is seen. Reproductive: No prostatomegaly.  Pelvic phleboliths. Other: Generalized mesenteric congestive changes. No focal inflammatory process is seen through the generalized edema. There is a small volume of pelvic ascites. There is no free hemorrhage, free air or incarcerated hernia. There is mild body wall anasarca. Musculoskeletal: Mild dextroscoliosis, degenerative changes of the lumbar spine, with additional lumbar findings which will be described below. CT LUMBAR SPINE WITHOUT CONTRAST FINDINGS Segmentation: There are 5 lumbar type segments, but L5 is transitional demonstrating right hemisacralization. Alignment: There is mild lumbar dextroscoliosis apex at L1-2. There is a trace, likely discogenic retrolisthesis at L2-3 where there is disc space loss. No other listhesis or further malalignment is seen. Vertebrae: At L2-3 there are small anterior and posterior peridiscal endplate lucencies with slight fragmentation. At L4-5, there is peridiscal endplate fragmentation and subcortical lucency to the right. Both findings could have an infectious or posttraumatic basis. The patient does relate a history of recent back injury. The findings at L2-3 are less clearly posttraumatic given the location anteriorly and posteriorly. At L1, there is nondisplaced bony fragmentation of the anterior superior vertebral body without height loss. All vertebrae are grossly normal in heights. This latter finding is almost certainly due to trauma. Other vertebrae are grossly intact. There is mild-to-moderate thoracolumbar spondylosis. Paraspinal soft tissues: There is paraspinal  soft tissue thickening at L2-3. This could be due to  paraspinal phlegmon or posttraumatic reactive soft tissue change. At L4-5 there is similar paraspinal soft tissue thickening on the right likely on the same etiology. There is no psoas abscess or localizing paraspinal fluid collection. Disc levels: T11-12 through L1-2: Inferior osteophytes. Normal disc heights. No herniations or stenoses. L2-3: Moderate disc space loss greater on the left. Left paracentral to left far lateral disc bulge extends into the left foramen with moderate to severe left foraminal stenosis. There is mild right foraminal stenosis. There is moderate effacement of the left subarticular zone but no significant spinal stenosis, no herniation is seen. L3-4: Normal disc height. Mild diffuse annular bulge. No herniation, canal zone or foraminal zone stenosis. L4-5: Mild disc space loss. Mild diffuse disc bulge without herniation or stenosis except for mild subarticular zone encroachment. The foramina appear clear. L5-S1: No abnormality. Other: Both SI joints are patent with slight spurring. No SI joint erosions. IMPRESSION: 1. Numerous small cavitary lung base nodules, most likely due to multifocal septic lung emboli, most of them containing fluid compatible with these being abscesses. 2. Small pericardial effusion. 3. Generalized mesenteric congestive changes, small volume of pelvic ascites, and mild body wall anasarca. 4. Mild hepatic steatosis with hepatosplenomegaly and increased prominence of the hepatic portal vein and splenic vein. 5. Cystitis versus bladder hypertrophy or underdistention. 6. Age advanced aortoiliac atherosclerosis. 7. L2-3 disc space loss with small anterior and posterior peridiscal endplate lucencies with slight fragmentation. There is paraspinal soft tissue thickening at this level which could be due to paraspinal phlegmon or posttraumatic reactive soft tissue change. 8. L4-5 peridiscal endplate fragmentation and subcortical lucency to the right, with paraspinal soft tissue  thickening on the right. Findings could be due to discitis/osteomyelitis or posttraumatic change. 9. Nondisplaced bony fragmentation of the anterior superior L1 vertebral body without height loss. This is almost certainly due to trauma. 10. L2-3 left paracentral to left far lateral disc bulge with moderate to severe left foraminal stenosis and moderate effacement of the left subarticular zone. 11. L4-5 mild subarticular zone encroachment. 12. Lumbar MRI without and with contrast may be helpful to further characterize these findings. Aortic Atherosclerosis (ICD10-I70.0). Electronically Signed   By: Francis Quam M.D.   On: 03/14/2024 22:24   DG Chest Port 1 View if patient is in a treatment room. Result Date: 03/14/2024 EXAM: 1 VIEW(S) XRAY OF THE CHEST 03/14/2024 08:26:20 PM COMPARISON: None available. CLINICAL HISTORY: Suspected Sepsis. Table formatting from the original note was not included.; Per triage: ; Pt c/o being sick for a while. Wife states re injured his herniated disk so he started just lying around for about 5 weeks but worse the last week and not eating. Losing week. Pt appears slightly jaundiced. Smells of urine. A/o but slow to respond. ; coughing FINDINGS: LUNGS AND PLEURA: Patchy nodular airspace opacities in both lungs. No pulmonary edema. No pleural effusion. No pneumothorax. HEART AND MEDIASTINUM: No acute abnormality of the cardiac and mediastinal silhouettes. BONES AND SOFT TISSUES: No acute osseous abnormality. IMPRESSION: 1. Patchy nodular airspace opacities in both lungs, concerning for infection. Electronically signed by: Ozell Daring MD 03/14/2024 08:32 PM EDT RP Workstation: [id number]    Microbiology: Results for orders placed or performed during the hospital encounter of 03/14/24  Culture, blood (Routine x 2)     Status: Abnormal   Collection Time: 03/14/24  7:34 PM   Specimen: Left Antecubital; Blood  Result Value Ref Range Status  Specimen Description   Final     LEFT ANTECUBITAL Performed at [facility], [address] [place], [place] [address]    Special Requests   Final    BOTTLES DRAWN AEROBIC ONLY Blood Culture results may not be optimal due to an inadequate volume of blood received in culture bottles Performed at [facility], [address] [place], [place] [address]    Culture  Setup Time   Final    GRAM POSITIVE COCCI AEROBIC BOTTLE ONLY Gram Stain Report Called to,Read Back By and Verified With: JACKSON @ 1141 ON 100325 BY CHARLOTT CROME RN CLENT SPRANG [id number] AT 1949 BY EC Performed at [facility], [address], [place], [place] [address]    Culture STAPHYLOCOCCUS AUREUS (A)  Final   Report Status 03/18/2024 FINAL  Final   Organism ID, Bacteria STAPHYLOCOCCUS AUREUS  Final      Susceptibility   Staphylococcus aureus - MIC*    CIPROFLOXACIN  <=0.5 SENSITIVE Sensitive     ERYTHROMYCIN <=0.25 SENSITIVE Sensitive     GENTAMICIN <=0.5 SENSITIVE Sensitive     OXACILLIN <=0.25 SENSITIVE Sensitive     TETRACYCLINE <=1 SENSITIVE Sensitive     VANCOMYCIN  1 SENSITIVE Sensitive     TRIMETH /SULFA  <=10 SENSITIVE Sensitive     CLINDAMYCIN  <=0.25 SENSITIVE Sensitive     RIFAMPIN <=0.5 SENSITIVE Sensitive     Inducible Clindamycin  NEGATIVE Sensitive     LINEZOLID  2 SENSITIVE Sensitive     * STAPHYLOCOCCUS AUREUS  Blood Culture ID Panel (Reflexed)     Status: Abnormal   Collection Time: 03/14/24  7:34 PM  Result Value Ref Range Status   Enterococcus faecalis NOT DETECTED NOT DETECTED Final   Enterococcus Faecium NOT DETECTED NOT DETECTED Final   Listeria monocytogenes NOT DETECTED NOT DETECTED Final   Staphylococcus species DETECTED (A) NOT DETECTED Final    Comment: CRITICAL RESULT CALLED TO, READ BACK BY AND VERIFIED WITH: RN LAVERLE SPRANG [id number] AT 1949 BY EC    Staphylococcus aureus (BCID) DETECTED (A) NOT DETECTED Final    Comment: CRITICAL RESULT CALLED TO, READ BACK BY AND VERIFIED WITH: RN CLENT SPRANG [id number] AT  1949 BY EC    Staphylococcus epidermidis NOT DETECTED NOT DETECTED Final   Staphylococcus lugdunensis NOT DETECTED NOT DETECTED Final   Streptococcus species NOT DETECTED NOT DETECTED Final   Streptococcus agalactiae NOT DETECTED NOT DETECTED Final   Streptococcus pneumoniae NOT DETECTED NOT DETECTED Final   Streptococcus pyogenes NOT DETECTED NOT DETECTED Final   A.calcoaceticus-baumannii NOT DETECTED NOT DETECTED Final   Bacteroides fragilis NOT DETECTED NOT DETECTED Final   Enterobacterales NOT DETECTED NOT DETECTED Final   Enterobacter cloacae complex NOT DETECTED NOT DETECTED Final   Escherichia coli NOT DETECTED NOT DETECTED Final   Klebsiella aerogenes NOT DETECTED NOT DETECTED Final   Klebsiella oxytoca NOT DETECTED NOT DETECTED Final   Klebsiella pneumoniae NOT DETECTED NOT DETECTED Final   Proteus species NOT DETECTED NOT DETECTED Final   Salmonella species NOT DETECTED NOT DETECTED Final   Serratia marcescens NOT DETECTED NOT DETECTED Final   Haemophilus influenzae NOT DETECTED NOT DETECTED Final   Neisseria meningitidis NOT DETECTED NOT DETECTED Final   Pseudomonas aeruginosa NOT DETECTED NOT DETECTED Final   Stenotrophomonas maltophilia NOT DETECTED NOT DETECTED Final   Candida albicans NOT DETECTED NOT DETECTED Final   Candida auris NOT DETECTED NOT DETECTED Final   Candida glabrata NOT DETECTED NOT DETECTED Final   Candida krusei NOT DETECTED NOT DETECTED Final  Candida parapsilosis NOT DETECTED NOT DETECTED Final   Candida tropicalis NOT DETECTED NOT DETECTED Final   Cryptococcus neoformans/gattii NOT DETECTED NOT DETECTED Final   Meth resistant mecA/C and MREJ NOT DETECTED NOT DETECTED Final    Comment: Performed at [facility], [address] [place], [place] [address]  Culture, blood (Routine x 2)     Status: None   Collection Time: 03/14/24 11:21 PM   Specimen: BLOOD  Result Value Ref Range Status   Specimen Description BLOOD BLOOD RIGHT ARM  Final    Special Requests AEROBIC BOTTLE ONLY Blood Culture adequate volume  Final   Culture   Final    NO GROWTH 5 DAYS Performed at [facility], [address], [place], [place] [address]    Report Status 03/19/2024 FINAL  Final  Culture, blood (Routine X 2) w Reflex to ID Panel     Status: None   Collection Time: 03/16/24  4:29 AM   Specimen: BLOOD LEFT HAND  Result Value Ref Range Status   Specimen Description BLOOD LEFT HAND AEROBIC BOTTLE ONLY  Final   Special Requests   Final    Blood Culture results may not be optimal due to an inadequate volume of blood received in culture bottles   Culture   Final    NO GROWTH 5 DAYS Performed at [facility], [address], [place], [place] [address]    Report Status 03/21/2024 FINAL  Final  Culture, blood (Routine X 2) w Reflex to ID Panel     Status: None   Collection Time: 03/16/24  4:36 AM   Specimen: BLOOD LEFT HAND  Result Value Ref Range Status   Specimen Description   Final    BLOOD LEFT HAND BOTTLES DRAWN AEROBIC AND ANAEROBIC   Special Requests Blood Culture adequate volume  Final   Culture   Final    NO GROWTH 5 DAYS Performed at [facility], [address] [place], [place] [address]    Report Status 03/21/2024 FINAL  Final  Resp panel by RT-PCR (RSV, Flu A&B, Covid) Anterior Nasal Swab     Status: None   Collection Time: 03/25/24  4:20 PM   Specimen: Anterior Nasal Swab  Result Value Ref Range Status   SARS Coronavirus 2 by RT PCR NEGATIVE NEGATIVE Final    Comment: (NOTE) SARS-CoV-2 target nucleic acids are NOT DETECTED.  The SARS-CoV-2 RNA is generally detectable in upper respiratory specimens during the acute phase of infection. The lowest concentration of SARS-CoV-2 viral copies this assay can detect is 138 copies/mL. A negative result does not preclude SARS-Cov-2 infection and should not be used as the sole basis for treatment or other patient management decisions. A negative result may occur with  improper specimen  collection/handling, submission of specimen other than nasopharyngeal swab, presence of viral mutation(s) within the areas targeted by this assay, and inadequate number of viral copies(<138 copies/mL). A negative result must be combined with clinical observations, patient history, and epidemiological information. The expected result is Negative.  Fact Sheet for Patients:  BloggerCourse.com  Fact Sheet for Healthcare Providers:  SeriousBroker.it  This test is no t yet approved or cleared by the [place]  FDA and  has been authorized for detection and/or diagnosis of SARS-CoV-2 by FDA under an Emergency Use Authorization (EUA). This EUA will remain  in effect (meaning this test can be used) for the duration of the COVID-19 declaration under Section 564(b)(1) of the Act, 21 U.S.C.section 360bbb-3(b)(1), unless the authorization  is terminated  or revoked sooner.       Influenza A by PCR NEGATIVE NEGATIVE Final   Influenza B by PCR NEGATIVE NEGATIVE Final    Comment: (NOTE) The Xpert Xpress SARS-CoV-2/FLU/RSV plus assay is intended as an aid in the diagnosis of influenza from Nasopharyngeal swab specimens and should not be used as a sole basis for treatment. Nasal washings and aspirates are unacceptable for Xpert Xpress SARS-CoV-2/FLU/RSV testing.  Fact Sheet for Patients: BloggerCourse.com  Fact Sheet for Healthcare Providers: SeriousBroker.it  This test is not yet approved or cleared by the [place]  FDA and has been authorized for detection and/or diagnosis of SARS-CoV-2 by FDA under an Emergency Use Authorization (EUA). This EUA will remain in effect (meaning this test can be used) for the duration of the COVID-19 declaration under Section 564(b)(1) of the Act, 21 U.S.C. section 360bbb-3(b)(1), unless the authorization is terminated or revoked.     Resp Syncytial  Virus by PCR NEGATIVE NEGATIVE Final    Comment: (NOTE) Fact Sheet for Patients: BloggerCourse.com  Fact Sheet for Healthcare Providers: SeriousBroker.it  This test is not yet approved or cleared by the [place]  FDA and has been authorized for detection and/or diagnosis of SARS-CoV-2 by FDA under an Emergency Use Authorization (EUA). This EUA will remain in effect (meaning this test can be used) for the duration of the COVID-19 declaration under Section 564(b)(1) of the Act, 21 U.S.C. section 360bbb-3(b)(1), unless the authorization is terminated or revoked.  Performed at [facility], [address], [place], [place] [address]   Culture, blood (Routine X 2) w Reflex to ID Panel     Status: None (Preliminary result)   Collection Time: 03/25/24  5:14 PM   Specimen: BLOOD LEFT HAND  Result Value Ref Range Status   Specimen Description BLOOD LEFT HAND AEROBIC BOTTLE ONLY  Final   Special Requests   Final    Blood Culture adequate volume Performed at [facility], [address] [place], [place] [address]    Culture PENDING  Incomplete   Report Status PENDING  Incomplete  Culture, blood (Routine X 2) w Reflex to ID Panel     Status: None (Preliminary result)   Collection Time: 03/25/24  5:14 PM   Specimen: BLOOD RIGHT ARM  Result Value Ref Range Status   Specimen Description   Final    BLOOD RIGHT ARM BOTTLES DRAWN AEROBIC AND ANAEROBIC   Special Requests   Final    Blood Culture adequate volume Performed at [facility], [address] [place], [place] [address]    Culture PENDING  Incomplete   Report Status PENDING  Incomplete    Labs: CBC: Recent Labs  Lab 03/21/24 0503 03/22/24 0440 03/23/24 0257 03/24/24 0829 03/25/24 0333 03/25/24 1616  WBC 12.2* 14.4* 10.0  --  9.5  --   HGB 7.1* 8.8* 7.1* 8.0* 7.0* 7.7*  HCT 23.1* 28.4* 22.8* 25.6* 22.2* 26.6*  MCV 95.5 94.7 94.6  --  95.7  --   PLT 308 424* 378  --   441*  --    Basic Metabolic Panel: Recent Labs  Lab 03/21/24 0503 03/22/24 0440 03/23/24 0257  NA 133* 131* 137  K 4.1 4.0 4.3  CL 102 100 103  CO2 22 23 25   GLUCOSE 108* 102* 91  BUN 20 17 15   CREATININE 0.90 0.80 0.82  CALCIUM 7.9* 8.5* 8.0*   Liver Function Tests: Recent Labs  Lab 03/21/24 0503 03/22/24 0440 03/23/24 0257  AST 40  44* 30  ALT 11 12 6   ALKPHOS 240* 255* 191*  BILITOT 0.5 0.7 0.5  PROT 6.5 7.8 6.4*  ALBUMIN 2.2* 2.7* 2.1*   CBG: No results for input(s): GLUCAP in the last 168 hours.  Discharge time spent: 36 minutes.  Signed: Concepcion Riser, MD [facility] 03/25/2024

## 2024-03-25 NOTE — Progress Notes (Signed)
 Progress Note   Patient: [name] [name] FMW:[id number] DOB: Jun 08, 1976 DOA: 03/14/2024     10 DOS: the patient was seen and examined on 03/25/2024   Brief hospital course: [name] [name] is a 48 y.o. male with medical history significant of anxiety disorder, tobacco abuse, chronic back pain who presents to the emergency department from home due to generalized weakness and poor oral intake. Patient complained of hurting his back about 5 weeks ago and he has been laying in bed most of the time for about a month.  He believed that he aggravated the back from sleeping in an uncomfortable position.  He denied any falls. For about a week now, wife at bedside reports poor oral intake but has been tolerating liquids without any difficulty.  He denies dysphagia.  But having productive cough of rusty colored sputum and subjective fever within the last few days. Patient denies any IV drug use in the last 6 months, alcohol use in past few months and cigarettes use in the past few days.   ED Course:  Patient was tachypneic, tachycardic, but other vital signs are within normal range.  Workup in the ED showed normocytic anemia, thrombocytopenia.  BMP shows sodium 133, potassium 4.8, chloride 98, bicarb 21, blood glucose 129, BUN 46, creatinine 1.45 (last lab for comparison was creatinine of 0.88 and this was 7 years ago), albumin 2.7, AST 48, ALT 23, ALP 362, total bilirubin 1.6, lactic acid 2.5 > 1.7.  TSH 1.730, ammonia 28.  Blood culture pending CT abdomen pelvis showed small cavitary lung base nodules, most likely due to multifocal septic lung emboli, most of them continue fluid compatible with these being abscesses.  Small pericardial effusion. Chest x-ray showed patchy nodular airspace opacities in both lungs, concerning for infection Patient was treated with IV ceftriaxone  and azithromycin  were given for pneumonia, changed to cefepime and vancomycin  were given for osteomyelitis of the spine. morphine  was  given, and Zofran .   ID evaluated him, abx changed to Ancef  for 4 weeks due to MSSA in his blood. He is not OPAT candidate and will need to stay in hospital until 04/13/24.    Principal Problem:   Sepsis due to pneumonia [name]   Pressure injury of skin   Lactic acidosis   Osteomyelitis of lumbar spine (HCC)   Thrombocytopenia   Transaminasemia   Hypoalbuminemia   Failure to thrive in adult  Assessment and Plan: Sepsis due to pneumonia/ small Septic embolism Osteomyelitis of the lumbar spine Staph aureus bacteremia- POA: Patient met sepsis criteria due to being tachycardic, tachypneic -   Chest x-ray was suggestive of infection and CT abdomen and pelvis was suggestive of septic embolism.  IV hydration per sepsis protocol was provided Was started on IV ceftriaxone  and azithromycin  in ED which were broadened to cefepime and vancomycin  therapy after consulting ID, reviewed blood cultures.  CT abdomen and pelvis was suggestive of discitis/osteomyelitis of posttraumatic change. MRI of lumbar spine: There is discitis/osteomyelitis at L3-L4 and L5-S1 with a septic right facet joint at L5-S1. There is paraspinal inflammation with small bilateral psoas abscesses. The bone marrow signal is diffusely abnormal, possibly due to anemia.   ID evaluated the patient. Blood cultures grew Staph aureus MSSA.  Vancomycin , cefepime stopped. Sepsis pathology resolved, echocardiogram unremarkable for valvular vegetation, TEE deferred at this time.  Continue Ancef  therapy for total 4 weeks per ID recommendations if repeat cultures remain negative. 03/25/24 He did spike fever 102.3  this morning, felt chills.  Continue IV Ancef  end of therapy 04/13/24. Repeat MRI L spine after abx completion.  Continue IV Dilaudid  0.5 mg every 3 hours as needed for severe pain.  Percocet 5/ 325 mg Q4 hourly for moderate pain. Advised oral pain meds as possible. Continue fall precaution. PT/  OT follow-up.  Encourage out of bed.   Thrombocytopenia Transaminitis In the setting of hepatic steatosis. Platelets, LFTs improved.  Acute on chronic anemia: Hb dropped to 7 03/25/24, 1unit PRBC ordered. Continue Iron and folate acid supplements.  Severe protein calorie malnutrition Failure to thrive in adult Encourage soft diet and supplements.   Pressure ulcer to sacrum-stage II Continue current WOC dressing changes.  Severe protein malnutrition- Encourage oral diet, supplements.      Out of bed to chair. Incentive spirometry. Nursing supportive care. Fall, aspiration precautions. Diet:  Diet Orders (From admission, onward)     Start     Ordered   03/17/24 1338  DIET SOFT Room service appropriate? Yes; Fluid consistency: Thin  Diet effective now       Question Answer Comment  Room service appropriate? Yes   Fluid consistency: Thin      03/17/24 1337           DVT prophylaxis: SCDs Start: 03/15/24 0330  Level of care: Telemetry   Code Status: Full Code  Subjective: Patient is seen and examined today morning. Tmax 102.3. he felt chills and rigors this morning. Complains of back pain, states he is working with mobility specialist.  Physical Exam: Vitals:   03/25/24 0941 03/25/24 1116 03/25/24 1223 03/25/24 1327  BP: (!) 147/121 109/69 111/78 117/82  Pulse: (!) 181 (!) 137 (!) 127 (!) 116  Resp: 20  20 18   Temp: 99.2 F (37.3 C) (!) 102.3 F (39.1 C) 98.8 F (37.1 C) 98.7 F (37.1 C)  TempSrc: Oral Oral Oral Oral  SpO2: 97% 93% 96% 99%  Weight:      Height:        General - Young Caucasian weak male, no apparent distress HEENT - PERRLA, EOMI, atraumatic head, non tender sinuses. Lung - Clear, basal rales, no rhonchi, wheezes. Heart - S1, S2 heard, no murmurs, rubs, trace pedal edema. Abdomen - Soft, non tender, bowel sounds good Neuro - Alert, awake able to answer, follow simple commands, non focal exam. Skin - Warm and dry.  Data  Reviewed:      Latest Ref Rng & Units 03/25/2024    3:33 AM 03/24/2024    8:29 AM 03/23/2024    2:57 AM  CBC  WBC 4.0 - 10.5 K/uL 9.5   10.0   Hemoglobin 13.0 - 17.0 g/dL 7.0  8.0  7.1   Hematocrit 39.0 - 52.0 % 22.2  25.6  22.8   Platelets 150 - 400 K/uL 441   378       Latest Ref Rng & Units 03/23/2024    2:57 AM 03/22/2024    4:40 AM 03/21/2024    5:03 AM  BMP  Glucose 70 - 99 mg/dL 91  897  891   BUN 6 - 20 mg/dL 15  17  20    Creatinine 0.61 - 1.24 mg/dL 9.17  9.19  9.09   Sodium 135 - 145 mmol/L 137  131  133   Potassium 3.5 - 5.1 mmol/L 4.3  4.0  4.1   Chloride 98 - 111 mmol/L 103  100  102   CO2 22 - 32 mmol/L 25  23  22   Calcium 8.9 - 10.3 mg/dL 8.0  8.5  7.9    No results found.   Family Communication: Discussed with patient, understand and agree. All questions answered.  Disposition: Status is: Inpatient Remains inpatient appropriate because: Long-term antibiotic therapy till 04/13/24.  Not a good OPAT candidate.  May need to stay in the hospital until antibiotics finished. Discharge Destination: Home with Home Health     Time spent: 43 minutes  Author: Concepcion Riser, MD 03/25/2024 2:43 PM Secure chat 7am to 7pm For on call review [email or web address].

## 2024-03-29 LAB — TYPE AND SCREEN
ABO/RH(D): A POS
Antibody Screen: NEGATIVE
Unit division: 0

## 2024-03-29 LAB — BPAM RBC
Blood Product Expiration Date: 202511022359
Unit Type and Rh: 6200

## 2024-03-30 LAB — CULTURE, BLOOD (ROUTINE X 2)
Culture: NO GROWTH
Culture: NO GROWTH
Special Requests: ADEQUATE
Special Requests: ADEQUATE

## 2024-04-14 ENCOUNTER — Inpatient Hospital Stay (HOSPITAL_COMMUNITY): Payer: MEDICAID

## 2024-04-14 ENCOUNTER — Inpatient Hospital Stay (HOSPITAL_COMMUNITY)
Admission: EM | Admit: 2024-04-14 | Discharge: 2024-06-13 | DRG: 003 | Disposition: E | Payer: MEDICAID | Attending: Internal Medicine | Admitting: Internal Medicine

## 2024-04-14 ENCOUNTER — Emergency Department (HOSPITAL_COMMUNITY): Payer: MEDICAID

## 2024-04-14 ENCOUNTER — Other Ambulatory Visit: Payer: Self-pay

## 2024-04-14 DIAGNOSIS — I079 Rheumatic tricuspid valve disease, unspecified: Secondary | ICD-10-CM

## 2024-04-14 DIAGNOSIS — B9561 Methicillin susceptible Staphylococcus aureus infection as the cause of diseases classified elsewhere: Secondary | ICD-10-CM | POA: Diagnosis present

## 2024-04-14 DIAGNOSIS — A419 Sepsis, unspecified organism: Principal | ICD-10-CM | POA: Diagnosis present

## 2024-04-14 DIAGNOSIS — R4182 Altered mental status, unspecified: Secondary | ICD-10-CM

## 2024-04-14 DIAGNOSIS — D649 Anemia, unspecified: Secondary | ICD-10-CM

## 2024-04-14 DIAGNOSIS — K6812 Psoas muscle abscess: Secondary | ICD-10-CM

## 2024-04-14 DIAGNOSIS — G9341 Metabolic encephalopathy: Secondary | ICD-10-CM

## 2024-04-14 DIAGNOSIS — R7881 Bacteremia: Secondary | ICD-10-CM | POA: Diagnosis present

## 2024-04-14 DIAGNOSIS — I2690 Septic pulmonary embolism without acute cor pulmonale: Secondary | ICD-10-CM

## 2024-04-14 DIAGNOSIS — R7989 Other specified abnormal findings of blood chemistry: Secondary | ICD-10-CM

## 2024-04-14 DIAGNOSIS — D696 Thrombocytopenia, unspecified: Secondary | ICD-10-CM

## 2024-04-14 DIAGNOSIS — M4620 Osteomyelitis of vertebra, site unspecified: Secondary | ICD-10-CM | POA: Diagnosis present

## 2024-04-14 DIAGNOSIS — A498 Other bacterial infections of unspecified site: Secondary | ICD-10-CM

## 2024-04-14 DIAGNOSIS — E860 Dehydration: Secondary | ICD-10-CM

## 2024-04-14 DIAGNOSIS — N179 Acute kidney failure, unspecified: Secondary | ICD-10-CM

## 2024-04-14 DIAGNOSIS — A4901 Methicillin susceptible Staphylococcus aureus infection, unspecified site: Secondary | ICD-10-CM

## 2024-04-14 DIAGNOSIS — J9602 Acute respiratory failure with hypercapnia: Secondary | ICD-10-CM | POA: Diagnosis present

## 2024-04-14 DIAGNOSIS — R6520 Severe sepsis without septic shock: Secondary | ICD-10-CM | POA: Diagnosis present

## 2024-04-14 DIAGNOSIS — J9601 Acute respiratory failure with hypoxia: Secondary | ICD-10-CM | POA: Diagnosis present

## 2024-04-14 LAB — COMPREHENSIVE METABOLIC PANEL WITH GFR
ALT: 6 U/L (ref 0–44)
AST: 35 U/L (ref 15–41)
Albumin: 2.7 g/dL — ABNORMAL LOW (ref 3.5–5.0)
Alkaline Phosphatase: 372 U/L — ABNORMAL HIGH (ref 38–126)
Anion gap: 21 — ABNORMAL HIGH (ref 5–15)
BUN: 83 mg/dL — ABNORMAL HIGH (ref 6–20)
CO2: 20 mmol/L — ABNORMAL LOW (ref 22–32)
Calcium: 8.9 mg/dL (ref 8.9–10.3)
Chloride: 99 mmol/L (ref 98–111)
Creatinine, Ser: 3.4 mg/dL — ABNORMAL HIGH (ref 0.61–1.24)
GFR, Estimated: 22 mL/min — ABNORMAL LOW (ref >60)
Glucose, Bld: 109 mg/dL — ABNORMAL HIGH (ref 70–99)
Potassium: 4.9 mmol/L (ref 3.5–5.1)
Sodium: 140 mmol/L (ref 135–145)
Total Bilirubin: 4.1 mg/dL — ABNORMAL HIGH (ref 0.0–1.2)
Total Protein: 7.3 g/dL (ref 6.5–8.1)

## 2024-04-14 LAB — RESP PANEL BY RT-PCR (RSV, FLU A&B, COVID)  RVPGX2
Influenza A by PCR: NEGATIVE
Influenza B by PCR: NEGATIVE
Resp Syncytial Virus by PCR: NEGATIVE
SARS Coronavirus 2 by RT PCR: NEGATIVE

## 2024-04-14 LAB — I-STAT CHEM 8, ED
BUN: 88 mg/dL — ABNORMAL HIGH (ref 6–20)
Calcium, Ion: 1.11 mmol/L — ABNORMAL LOW (ref 1.15–1.40)
Chloride: 106 mmol/L (ref 98–111)
Creatinine, Ser: 4 mg/dL — ABNORMAL HIGH (ref 0.61–1.24)
Glucose, Bld: 107 mg/dL — ABNORMAL HIGH (ref 70–99)
HCT: 23 % — ABNORMAL LOW (ref 39.0–52.0)
Hemoglobin: 7.8 g/dL — ABNORMAL LOW (ref 13.0–17.0)
Potassium: 4.8 mmol/L (ref 3.5–5.1)
Sodium: 138 mmol/L (ref 135–145)
TCO2: 19 mmol/L — ABNORMAL LOW (ref 22–32)

## 2024-04-14 LAB — BLOOD GAS, VENOUS
Acid-base deficit: 3.9 mmol/L — ABNORMAL HIGH (ref 0.0–2.0)
Bicarbonate: 20.8 mmol/L (ref 20.0–28.0)
Drawn by: 66297
O2 Saturation: 24.3 %
Patient temperature: 37.3
pCO2, Ven: 37 mmHg — ABNORMAL LOW (ref 44–60)
pH, Ven: 7.37 (ref 7.25–7.43)
pO2, Ven: <31 mmHg — CL (ref 32–45)

## 2024-04-14 LAB — CBC
HCT: 25.6 % — ABNORMAL LOW (ref 39.0–52.0)
Hemoglobin: 7.7 g/dL — ABNORMAL LOW (ref 13.0–17.0)
MCH: 27.1 pg (ref 26.0–34.0)
MCHC: 30.1 g/dL (ref 30.0–36.0)
MCV: 90.1 fL (ref 80.0–100.0)
Platelets: 57 K/uL — ABNORMAL LOW (ref 150–400)
RBC: 2.84 MIL/uL — ABNORMAL LOW (ref 4.22–5.81)
RDW: 17.9 % — ABNORMAL HIGH (ref 11.5–15.5)
WBC: 20 K/uL — ABNORMAL HIGH (ref 4.0–10.5)
nRBC: 0.3 % — ABNORMAL HIGH (ref 0.0–0.2)

## 2024-04-14 LAB — AMMONIA: Ammonia: 17 umol/L (ref 9–35)

## 2024-04-14 LAB — URINALYSIS, W/ REFLEX TO CULTURE (INFECTION SUSPECTED)
Bacteria, UA: NONE SEEN
Bilirubin Urine: NEGATIVE
Glucose, UA: NEGATIVE mg/dL
Ketones, ur: NEGATIVE mg/dL
Nitrite: NEGATIVE
Protein, ur: 30 mg/dL — AB
Specific Gravity, Urine: 1.014 (ref 1.005–1.030)
pH: 5 (ref 5.0–8.0)

## 2024-04-14 LAB — PROTIME-INR
INR: 1.4 — ABNORMAL HIGH (ref 0.8–1.2)
Prothrombin Time: 17.9 s — ABNORMAL HIGH (ref 11.4–15.2)

## 2024-04-14 LAB — BLOOD GAS, ARTERIAL
Acid-base deficit: 2.5 mmol/L — ABNORMAL HIGH (ref 0.0–2.0)
Bicarbonate: 21.7 mmol/L (ref 20.0–28.0)
Drawn by: 27016
O2 Saturation: 100 %
Patient temperature: 37.1
pCO2 arterial: 35 mmHg (ref 32–48)
pH, Arterial: 7.4 (ref 7.35–7.45)
pO2, Arterial: 184 mmHg — ABNORMAL HIGH (ref 83–108)

## 2024-04-14 LAB — GLUCOSE, CAPILLARY
Glucose-Capillary: 108 mg/dL — ABNORMAL HIGH (ref 70–99)
Glucose-Capillary: 123 mg/dL — ABNORMAL HIGH (ref 70–99)

## 2024-04-14 LAB — TROPONIN T, HIGH SENSITIVITY
Troponin T High Sensitivity: 55 ng/L — ABNORMAL HIGH (ref 0–19)
Troponin T High Sensitivity: 49 ng/L — ABNORMAL HIGH (ref 0–19)

## 2024-04-14 LAB — CBG MONITORING, ED
Glucose-Capillary: <10 mg/dL — CL (ref 70–99)
Glucose-Capillary: <10 mg/dL — CL (ref 70–99)
Glucose-Capillary: 121 mg/dL — ABNORMAL HIGH (ref 70–99)

## 2024-04-14 LAB — LACTIC ACID, PLASMA
Lactic Acid, Venous: 5.9 mmol/L (ref 0.5–1.9)
Lactic Acid, Venous: 4.4 mmol/L (ref 0.5–1.9)

## 2024-04-14 LAB — MRSA NEXT GEN BY PCR, NASAL: MRSA by PCR Next Gen: NOT DETECTED

## 2024-04-14 MED ORDER — SODIUM CHLORIDE 0.9 % IV SOLN: 250.0000 mL | INTRAVENOUS | Status: AC

## 2024-04-14 MED ORDER — DOCUSATE SODIUM 50 MG/5ML PO LIQD
100.0000 mg | Freq: Two times a day (BID) | ORAL | Status: DC | PRN
Start: 2024-04-14 — End: 2024-04-21

## 2024-04-14 MED ORDER — VANCOMYCIN HCL IN DEXTROSE 1-5 GM/200ML-% IV SOLN
1000.0000 mg | Freq: Once | INTRAVENOUS | Status: AC
  Administered 2024-04-14: 1000 mg via INTRAVENOUS
  Filled 2024-04-14: qty 200

## 2024-04-14 MED ORDER — DEXTROSE 50 % IV SOLN: 1.0000 | Freq: Once | INTRAVENOUS | Status: DC

## 2024-04-14 MED ORDER — LACTATED RINGERS IV BOLUS (SEPSIS)
1000.0000 mL | Freq: Once | INTRAVENOUS | Status: AC
  Administered 2024-04-14: 1000 mL via INTRAVENOUS

## 2024-04-14 MED ORDER — POLYETHYLENE GLYCOL 3350 17 G PO PACK: 17.0000 g | PACK | Freq: Every day | ORAL | Status: DC

## 2024-04-14 MED ORDER — VANCOMYCIN VARIABLE DOSE PER UNSTABLE RENAL FUNCTION (PHARMACIST DOSING): Status: DC

## 2024-04-14 MED ORDER — LACTATED RINGERS IV SOLN: INTRAVENOUS | Status: DC

## 2024-04-14 MED ORDER — PROPOFOL 1000 MG/100ML IV EMUL
0.0000 ug/kg/min | INTRAVENOUS | Status: DC
  Administered 2024-04-14: 25 ug/kg/min via INTRAVENOUS
  Administered 2024-04-15: 15 ug/kg/min via INTRAVENOUS
  Filled 2024-04-14 (×2): qty 100

## 2024-04-14 MED ORDER — ONDANSETRON HCL 4 MG/2ML IJ SOLN
4.0000 mg | Freq: Four times a day (QID) | INTRAMUSCULAR | Status: DC | PRN
  Administered 2024-05-03: 4 mg via INTRAVENOUS
  Filled 2024-04-14: qty 2

## 2024-04-14 MED ORDER — ETOMIDATE 2 MG/ML IV SOLN
INTRAVENOUS | Status: AC
Start: 2024-04-14 — End: 2024-04-14
  Administered 2024-04-14: 20 mg
  Filled 2024-04-14: qty 20

## 2024-04-14 MED ORDER — POLYETHYLENE GLYCOL 3350 17 G PO PACK
17.0000 g | PACK | Freq: Every day | ORAL | Status: DC | PRN
Start: 2024-04-14 — End: 2024-04-21

## 2024-04-14 MED ORDER — PANTOPRAZOLE SODIUM 40 MG IV SOLR
40.0000 mg | Freq: Every day | INTRAVENOUS | Status: DC
  Administered 2024-04-14 – 2024-04-20 (×7): 40 mg via INTRAVENOUS
  Filled 2024-04-14 (×7): qty 10

## 2024-04-14 MED ORDER — GLUCAGON HCL RDNA (DIAGNOSTIC) 1 MG IJ SOLR: 1.0000 mg | Freq: Once | INTRAMUSCULAR | Status: DC

## 2024-04-14 MED ORDER — GLUCAGON HCL RDNA (DIAGNOSTIC) 1 MG IJ SOLR: 1.0000 mg | Freq: Once | INTRAMUSCULAR | Status: AC

## 2024-04-14 MED ORDER — SUCCINYLCHOLINE CHLORIDE 200 MG/10ML IV SOSY: 120.0000 mg | PREFILLED_SYRINGE | Freq: Once | INTRAVENOUS | Status: AC

## 2024-04-14 MED ORDER — SODIUM CHLORIDE 0.9 % IV SOLN
1.0000 g | Freq: Three times a day (TID) | INTRAVENOUS | Status: DC
  Administered 2024-04-14: 1 g via INTRAVENOUS
  Filled 2024-04-14: qty 20

## 2024-04-14 MED ORDER — CHLORHEXIDINE GLUCONATE CLOTH 2 % EX PADS
6.0000 | MEDICATED_PAD | Freq: Every day | CUTANEOUS | Status: DC
  Administered 2024-04-14 – 2024-04-17 (×4): 6 via TOPICAL

## 2024-04-14 MED ORDER — FENTANYL 2500MCG IN NS 250ML (10MCG/ML) PREMIX INFUSION
0.0000 ug/h | INTRAVENOUS | Status: DC
  Administered 2024-04-14: 25 ug/h via INTRAVENOUS
  Filled 2024-04-14: qty 250

## 2024-04-14 MED ORDER — DOCUSATE SODIUM 50 MG/5ML PO LIQD: 100.0000 mg | Freq: Two times a day (BID) | ORAL | Status: DC

## 2024-04-14 MED ORDER — SUCCINYLCHOLINE CHLORIDE 200 MG/10ML IV SOSY
PREFILLED_SYRINGE | INTRAVENOUS | Status: AC
  Administered 2024-04-14: 120 mg via INTRAVENOUS
  Filled 2024-04-14: qty 10

## 2024-04-14 MED ORDER — SODIUM CHLORIDE 0.9 % IV SOLN
1.0000 g | Freq: Two times a day (BID) | INTRAVENOUS | Status: DC
  Administered 2024-04-15: 1 g via INTRAVENOUS
  Filled 2024-04-14: qty 20

## 2024-04-14 MED ORDER — NOREPINEPHRINE 4 MG/250ML-% IV SOLN
0.0000 ug/min | INTRAVENOUS | Status: DC
  Administered 2024-04-15: 2 ug/min via INTRAVENOUS
  Filled 2024-04-14: qty 250

## 2024-04-14 MED ORDER — PROPOFOL 1000 MG/100ML IV EMUL
INTRAVENOUS | Status: AC
  Administered 2024-04-14: 5 ug/kg/min via INTRAVENOUS
  Filled 2024-04-14: qty 100

## 2024-04-14 MED ORDER — GLUCAGON HCL RDNA (DIAGNOSTIC) 1 MG IJ SOLR
INTRAMUSCULAR | Status: AC
  Administered 2024-04-14: 1 mg via INTRAMUSCULAR
  Filled 2024-04-14: qty 1

## 2024-04-14 NOTE — Sepsis Progress Note (Signed)
 Pt in radiology sent note to provider related to 2nd lactate.

## 2024-04-14 NOTE — ED Notes (Signed)
 EDP at bedside during triage

## 2024-04-14 NOTE — Progress Notes (Signed)
 Pharmacy Antibiotic Note  [name] [name] is a 48 y.o. male admitted on 04/14/2024 with sepsis.  Pharmacy has been consulted for Vancomycin  dosing.  Plan: Vancomycin  1000 mg IV X 1 dose 11/2 1845, given Scr 4x baseline have ordered vancomycin  level with morning labs to help guide dosing. No maintenance therapy ordered yet.  Height: 6' (182.9 cm) Weight: 62.5 kg (137 lb 12.6 oz) IBW/kg (Calculated) : 77.6  Temp (24hrs), Avg:98.8 F (37.1 C), Min:98.5 F (36.9 C), Max:99.2 F (37.3 C)  Recent Labs  Lab 04/14/24 1527 04/14/24 1531 04/14/24 1831  WBC  --  20.0*  --   CREATININE 4.00* 3.40*  --   LATICACIDVEN  --  5.9* 4.4*    Estimated Creatinine Clearance: 24 mL/min (A) (by C-G formula based on SCr of 3.4 mg/dL (H)).    Allergies  Allergen Reactions   Penicillins Other (See Comments)    Unknown     Antimicrobials this admission: Vancomycin  11/2 >>  Meropenem 11/2 >>   Dose adjustments this admission: Meropenem 1g Q8h > 1g Q12h  Microbiology results:   Thank you for allowing pharmacy to be a part of this patient's care.  Larraine Brazier, PharmD Clinical Pharmacist 04/14/2024  9:20 PM **Pharmacist phone directory can now be found on [email or web address] ([email or web address]).  Listed under [facility].

## 2024-04-14 NOTE — Progress Notes (Signed)
 eLink Physician-Brief Progress Note Patient Name: [name] [name] DOB: April 06, 1976 MRN: [id number]   Date of Service  04/14/2024  HPI/Events of Note  48 yo M with history of substance abuse,  recently admitted with MSSA PNA MSSA bacteremia psoas abscesses and lumbar osteo 03/14/24-03/25/24 (left AMA) who presented to [facility] ED 04/14/24 w AMS. Arrived to ED covered in feces, altered, with poor airway protection and was intubated.  Evaluation reveals acute renal failure and elevated lactic acid.  Empiric abx given including Vanc along with volume resuscitation.   Results of ct chest/abd/pelvis pending.   eICU Interventions  Chart reviewed     Intervention Category Evaluation Type: New Patient Evaluation  CLAUDENE AGENT, P 04/14/2024, 10:37 PM

## 2024-04-14 NOTE — ED Provider Notes (Signed)
 [facility] EMERGENCY DEPARTMENT AT [facility] Provider Note   CSN: [id number] Arrival date & time: 04/14/24  1441     Patient presents with: Altered Mental Status   [name] [name] is a 48 y.o. male.   Patient is a 48 year old male who presents to the emergency department by EMS secondary to fever, altered mental status.  Patient was recently admitted to the hospital secondary to septic emboli, osteomyelitis of the lumbar spine, pneumonia, bacteremia and did leave AGAINST MEDICAL ADVICE.  Per their notes the patient was post to be on Ancef  but do not believe that he has been on this.  Patient is unable to provide his own history at this point and this was obtained by EMS and medical records.   Altered Mental Status Presenting symptoms: confusion   Associated symptoms: fever        Prior to Admission medications   Medication Sig Start Date End Date Taking? Authorizing Provider  ALPRAZolam  (XANAX ) 1 MG tablet Take 1 mg by mouth in the morning, at noon, in the evening, and at bedtime.    [provider]  Oxycodone  HCl 10 MG TABS Take 1 tablet by mouth 3 (three) times daily.    [provider]    Allergies: Penicillins    Review of Systems  Constitutional:  Positive for fever.  Psychiatric/Behavioral:  Positive for confusion.     Updated Vital Signs BP 96/63   Pulse (!) 129   Temp 99.2 F (37.3 C) (Rectal)   Resp (!) 27   Ht 6' (1.829 m)   Wt 62.5 kg   SpO2 100%   BMI 18.69 kg/m   Physical Exam Constitutional:      Appearance: He is ill-appearing and toxic-appearing.  HENT:     Head: Normocephalic and atraumatic.     Nose: Nose normal.     Mouth/Throat:     Mouth: Mucous membranes are moist.  Eyes:     Extraocular Movements: Extraocular movements intact.     Conjunctiva/sclera: Conjunctivae normal.     Pupils: Pupils are equal, round, and reactive to light.  Cardiovascular:     Rate and Rhythm: Regular rhythm. Tachycardia  present.     Pulses: Normal pulses.     Heart sounds: Normal heart sounds. No murmur heard.    No gallop.  Pulmonary:     Effort: Pulmonary effort is normal.     Breath sounds: Normal breath sounds. No stridor. No wheezing, rhonchi or rales.     Comments: Tachypnea Abdominal:     General: Abdomen is flat. Bowel sounds are normal. There is no distension.     Palpations: Abdomen is soft.     Tenderness: There is no abdominal tenderness.  Musculoskeletal:        General: Normal range of motion.     Cervical back: Normal range of motion and neck supple.  Skin:    General: Skin is warm and dry.     Coloration: Skin is jaundiced.     Findings: No rash.  Neurological:     Mental Status: He is disoriented.     (all labs ordered are listed, but only abnormal results are displayed) Labs Reviewed  COMPREHENSIVE METABOLIC PANEL WITH GFR - Abnormal; Notable for the following components:      Result Value   CO2 20 (*)    Glucose, Bld 109 (*)    BUN 83 (*)    Creatinine, Ser 3.40 (*)    Albumin 2.7 (*)  Alkaline Phosphatase 372 (*)    Total Bilirubin 4.1 (*)    GFR, Estimated 22 (*)    Anion gap 21 (*)    All other components within normal limits  CBC - Abnormal; Notable for the following components:   WBC 20.0 (*)    RBC 2.84 (*)    Hemoglobin 7.7 (*)    HCT 25.6 (*)    RDW 17.9 (*)    Platelets 57 (*)    nRBC 0.3 (*)    All other components within normal limits  LACTIC ACID, PLASMA - Abnormal; Notable for the following components:   Lactic Acid, Venous 5.9 (*)    All other components within normal limits  PROTIME-INR - Abnormal; Notable for the following components:   Prothrombin Time 17.9 (*)    INR 1.4 (*)    All other components within normal limits  URINALYSIS, W/ REFLEX TO CULTURE (INFECTION SUSPECTED) - Abnormal; Notable for the following components:   Color, Urine AMBER (*)    APPearance HAZY (*)    Hgb urine dipstick LARGE (*)    Protein, ur 30 (*)     Leukocytes,Ua SMALL (*)    All other components within normal limits  BLOOD GAS, VENOUS - Abnormal; Notable for the following components:   pCO2, Ven 37 (*)    pO2, Ven <31 (*)    Acid-base deficit 3.9 (*)    All other components within normal limits  CBG MONITORING, ED - Abnormal; Notable for the following components:   Glucose-Capillary <10 (*)    All other components within normal limits  CBG MONITORING, ED - Abnormal; Notable for the following components:   Glucose-Capillary <10 (*)    All other components within normal limits  I-STAT CHEM 8, ED - Abnormal; Notable for the following components:   BUN 88 (*)    Creatinine, Ser 4.00 (*)    Glucose, Bld 107 (*)    Calcium, Ion 1.11 (*)    TCO2 19 (*)    Hemoglobin 7.8 (*)    HCT 23.0 (*)    All other components within normal limits  TROPONIN T, HIGH SENSITIVITY - Abnormal; Notable for the following components:   Troponin T High Sensitivity 55 (*)    All other components within normal limits  RESP PANEL BY RT-PCR (RSV, FLU A&B, COVID)  RVPGX2  CULTURE, BLOOD (ROUTINE X 2)  CULTURE, BLOOD (ROUTINE X 2)  URINE CULTURE  AMMONIA  LACTIC ACID, PLASMA  TRIGLYCERIDES  CBG MONITORING, ED  TROPONIN T, HIGH SENSITIVITY    EKG: None  Radiology: No results found.   .Critical Care  Performed by: [name] Lonni BIRCH, PA-C Authorized by: [name] Lonni BIRCH, PA-C   Critical care provider statement:    Critical care time (minutes):  65   Critical care was necessary to treat or prevent imminent or life-threatening deterioration of the following conditions:  Sepsis   Critical care was time spent personally by me on the following activities:  Development of treatment plan with patient or surrogate, discussions with consultants, evaluation of patient's response to treatment, examination of patient, ordering and review of laboratory studies, ordering and review of radiographic studies, ordering and performing treatments and  interventions, pulse oximetry, re-evaluation of patient's condition and review of old charts   I assumed direction of critical care for this patient from another provider in my specialty: no     Care discussed with: admitting provider      Medications Ordered in the ED  lactated ringers  infusion (has no administration in time range)  lactated ringers  bolus 1,000 mL (has no administration in time range)    And  lactated ringers  bolus 1,000 mL (has no administration in time range)  vancomycin  (VANCOCIN ) IVPB 1000 mg/200 mL premix (has no administration in time range)  meropenem (MERREM) 1 g in sodium chloride  0.9 % 100 mL IVPB (1 g Intravenous New Bag/Given 04/14/24 1649)  dextrose  50 % solution 50 mL (has no administration in time range)  succinylcholine  (ANECTINE ) 200 MG/10ML syringe (has no administration in time range)  etomidate (AMIDATE) 2 MG/ML injection (has no administration in time range)  succinylcholine  (ANECTINE ) syringe 120 mg (has no administration in time range)  propofol  (DIPRIVAN ) 1000 MG/100ML infusion (5 mcg/kg/min  62.5 kg Intravenous New Bag/Given 04/14/24 1706)  glucagon (human recombinant) (GLUCAGEN) injection 1 mg (1 mg Intramuscular Given 04/14/24 1515)                                    Medical Decision Making Amount and/or Complexity of Data Reviewed Labs: ordered. Radiology: ordered.  Risk Prescription drug management. Decision regarding hospitalization.   This patient presents to the ED for concern of altered mental status, this involves an extensive number of treatment options, and is a complaint that carries with it a high risk of complications and morbidity.  The differential diagnosis includes sepsis, pneumonia, urinary tract infection, bacteremia, septic emboli, osteomyelitis, acute viral syndrome, cholangitis, cholecystitis   Co morbidities that complicate the patient evaluation  Pneumonia, hypertension, failure to thrive   Additional history  obtained:  Additional history obtained from family External records from outside source obtained and reviewed including medical records   Lab Tests:  I Ordered, and personally interpreted labs.  The pertinent results include: Leukocytosis, anemia, elevated creatinine, elevated alk phos and total bilirubin, normal electrolytes, elevated lactic acid, urinalysis with large hemoglobin elevated troponin, normal ammonia   Imaging Studies ordered:  I ordered imaging studies including chest x-ray I independently visualized and interpreted imaging which showed patchy right lower lobe infiltrate I agree with the radiologist interpretation   Cardiac Monitoring: / EKG:  The patient was maintained on a cardiac monitor.  I personally viewed and interpreted the cardiac monitored which showed an underlying rhythm of: Sinus tachycardia, no ST/T wave changes, no ischemic changes, no STEMI   Consultations Obtained:  I requested consultation with the critical care, Dr. Gretta,  and discussed lab and imaging findings as well as pertinent plan - they recommend: Admission   Problem List / ED Course / Critical interventions / Medication management  Patient does remain critically ill at this time.  He did present altered and was unable to provide his own history.  He was intubated to protect his airway.  Chest x-ray did show a right sided patchy infiltrates.  He has been treated with vancomycin  and meropenem.  He was recently admitted secondary to pneumonia, septic emboli, osteomyelitis of the lumbar spine, and bacteremia.  Blood work is consistent with sepsis at this time.  He is also in renal failure.  Have discussed patient case with Dr. Gretta with intensivist service who has excepted for admission.  CT scans of the head, chest abdomen and pelvis are still pending at this time. I ordered medication including D50, IV fluids, vancomycin , meropenem for sepsis, hypoglycemia Reevaluation of the patient after  these medicines showed that the patient improved I have reviewed the patients  home medicines and have made adjustments as needed   Social Determinants of Health:  None   Test / Admission - Considered:  Admission     Final diagnoses:  None    ED Discharge Orders     None          [name] [name] 04/14/24 1900    Suzette Pac, MD 04/15/24 1658

## 2024-04-14 NOTE — Sepsis Progress Note (Signed)
 eLink Sepsis tracking per protocol.

## 2024-04-14 NOTE — Progress Notes (Signed)
 PHARMACY NOTE:  ANTIMICROBIAL RENAL DOSAGE ADJUSTMENT  Current antimicrobial regimen includes a mismatch between antimicrobial dosage and estimated renal function.  As per policy approved by the Pharmacy & Therapeutics and Medical Executive Committees, the antimicrobial dosage will be adjusted accordingly.  Current antimicrobial dosage:  meropenem 1g IV every 8 hours  Indication: sepsis  Renal Function:  Estimated Creatinine Clearance: 24 mL/min (A) (by C-G formula based on SCr of 3.4 mg/dL (H)). []      On intermittent HD, scheduled: []      On CRRT    Antimicrobial dosage has been changed to:  meropenem 1g IV every 12 hours  Additional comments:  Thank you for allowing pharmacy to participate in this patient's care,  Suzen Sour, PharmD, BCCCP Clinical Pharmacist  Phone: [phone] 04/14/2024 8:45 PM  Please check AMION for all [facility] Pharmacy phone numbers After 10:00 PM, call Main Pharmacy [phone]

## 2024-04-14 NOTE — ED Triage Notes (Signed)
 Pt arrived [facility] from home for a sick person. Girlfriend states pt began declining this morning. Not answering questions, breathing funny,and pt was in the hospital for septic pneumonia and left AMA. Pt arrived with feces on him, jaundice skin. CBG 114

## 2024-04-14 NOTE — H&P (Signed)
 NAME:  [name] [name], MRN:  [id number], DOB:  15-Dec-1975, LOS: 0 ADMISSION DATE:  04/14/2024, CONSULTATION DATE:  04/14/24 REFERRING MD:  Suzette - [facility] EM, CHIEF COMPLAINT:   AMS  History of Present Illness:   48 yo M PMH recently admitted with MSSA PNA MSSA bacteremia psoas abscesses and lumbar osteo 03/14/24-03/25/24 (left AMA) who presented to [facility] ED 04/14/24 w AMS. Arrived to ED covered in feces,  altered, with poor airway protection and was intubated CXR with RLL opacities   Rcvd 2L LR bolus, started on vanc mero  Bcx sent   Initial labs VBG pH 7.37, pCO2 37, pO2 less than 31, HCO3 20.8 NA 140, K4.9, chloride 99, CO2 20, glucose 109, BUN 83, creatinine 4 on i-STAT> 3.4, anion gap 21, GFR 22  ALP 372, albumin 2.7, total bili 4.1  High-sensitivity troponin 55 > 49  Lactic acid 5.9 > 4.4  WBC 20, hemoglobin 7.7, hematocrit 25.6 Platelets 57 PT 17.9, INR 1.4  Ammonia 17   PCCM asked to admit to [place] campus in this setting    Pertinent  Medical History  MSSA bacteremia MSSA PNA Lumbar osteomyelitis  Psoas abscess  Severe protein calorie malnutrition FTT in adult  Significant Hospital Events: Including procedures, antibiotic start and stop dates in addition to other pertinent events   11/2 [facility] ED w AMS. Intubated. Started on vanc mero. Txf request to [place] to PCCM   Interim History / Subjective:  Intubated   Objective    Blood pressure 91/65, pulse (!) 113, temperature 98.8 F (37.1 C), resp. rate (!) 24, height 6' (1.829 m), weight 62.5 kg, SpO2 100%.    Vent Mode: PRVC FiO2 (%):  [60 %] 60 % Set Rate:  [20 bmp] 20 bmp Vt Set:  [379 mL] 620 mL PEEP:  [5 cmH20] 5 cmH20 Plateau Pressure:  [15 cmH20] 15 cmH20   Intake/Output Summary (Last 24 hours) at 04/14/2024 1830 Last data filed at 04/14/2024 1826 Gross per 24 hour  Intake 1100 ml  Output --  Net 1100 ml   Filed Weights   04/14/24 1455  Weight: 62.5 kg    Examination: General: acute on chronic adult  male, lying in icu bed on vent  HEENT: Normocephalic, PERRLA intact -2mm B/L , ETT, OG, Pink MM CV: s1,s2, RRR, no MRG, No JVD  pulm: clear, diminished, no distress on vent  Abs: bs active, soft  Extremities: no edema, no deformity, grimaces to pain  But does not move to pain Skin: jaundice, MSAD on sacrum  Neuro: Rass -3, responds to painful stimuli-grimcaces, cough gag reflex present  GU: foley- making urine   Resolved problem list   Assessment and Plan   Severe sepsis  Lactic acidosis  Elevated troponin-suspect demand ischemia in setting of severe sepsis, EKG in ED showing sinus tach -recent MSSA bacteremia, PNA, lumbar oseto, psoas abscess. Left AMA 10/13, not sure that he was dc w abx successfully but 4wk ancef  was rec P Admit to ICU for continuous cardiac and hemodynamic monitoring Trend lactic acid, WBC, fever curve Follow-up blood cultures and obtain tracheal aspirate Continue Vanco and meropenem for now Follow-up with CT of head and also CT chest abdomen pelvis Obtain echocardiogram-on 10/3 EF was 66 5%, no regional wall abnormalities, RV was normal with RV systolic function normal, LA mildly dilated, RA normal in size, no evidence of pericardial effusion  Acute hypoxic respiratory failure Intubated for airway protection in setting of acute metabolic encephalopathy suspect in the setting  of severe sepsis, but also has history of substance abuse P:  Continue ventilator support and lung protective strategies  Continue LTVV  Wean PEEP and Fio2 requirements to sat goal of >92%  HOB > 30 degrees Plat < 30  Aim for Driving pressures < 15  Intermittent Chest X-ray and ABGS Obtain and follow cultures-blood and tracheal aspirate VAP and PAD protocols in place  Wean sedation as tolerated, SBT and WUA daily -continue prop and fentanyl , RASS goal 0 to -1  Acute Metabolic Encephalopathy AMS  Hx of substance abuse  Suspect in setting of severe sepsis but has hx of substance  abuse, Substance abuse- urine tox positive for benzo, opiates, and cocaine on 03/15/24  Ammonia 17  P: Continue treatment as above Limit sedating meds Check urine tox, and ethanol level  Along with tylenol  and salicylate level  Delirium precautions   AKI  AGMA  Creatinine 3.4 upon admission P: Continue to trend renal function daily  Continue to monitor and optimize electrolytes daily Continue to monitor urine output Continue strict I/Os Continue Adequate renal perfusion  Avoid nephrotoxic agents   Hyperbilirubinemia  Elevated Alk phos  P: CT chest, abdomen, pelvis pending f/u with  Trend LFTs Obtain lipase and amylase,- follow up with   Anemia Thrombocytopenia- plt 57  - anemia stable from prior admission  Suspect thrombocytopenia in setting of severe sepsis P: Follow-up with CT head/CT chest abdomen pelvis to evaluate for bleeding Monitor for signs of bleeding Transfuse for hemoglobin less than 7 Trend CBC   Hx of HTN P: Hold antihtn for now in setting of severe sepsis  BP soft but MAP >65, suspect some related to sedation   Hx of Anxiety  P:  Continue supportive care for now   Labs   CBC: Recent Labs  Lab 04/14/24 1527 04/14/24 1531  WBC  --  20.0*  HGB 7.8* 7.7*  HCT 23.0* 25.6*  MCV  --  90.1  PLT  --  57*    Basic Metabolic Panel: Recent Labs  Lab 04/14/24 1527 04/14/24 1531  NA 138 140  K 4.8 4.9  CL 106 99  CO2  --  20*  GLUCOSE 107* 109*  BUN 88* 83*  CREATININE 4.00* 3.40*  CALCIUM  --  8.9   GFR: Estimated Creatinine Clearance: 24 mL/min (A) (by C-G formula based on SCr of 3.4 mg/dL (H)). Recent Labs  Lab 04/14/24 1531  WBC 20.0*  LATICACIDVEN 5.9*    Liver Function Tests: Recent Labs  Lab 04/14/24 1531  AST 35  ALT 6  ALKPHOS 372*  BILITOT 4.1*  PROT 7.3  ALBUMIN 2.7*   No results for input(s): LIPASE, AMYLASE in the last 168 hours. Recent Labs  Lab 04/14/24 1531  AMMONIA 17    ABG    Component Value  Date/Time   PHART 7.366 06/16/2007 0500   PCO2ART 45.7 (H) 06/16/2007 0500   PO2ART 78.5 (L) 06/16/2007 0500   HCO3 20.8 04/14/2024 1531   TCO2 19 (L) 04/14/2024 1527   ACIDBASEDEF 3.9 (H) 04/14/2024 1531   O2SAT 24.3 04/14/2024 1531     Coagulation Profile: Recent Labs  Lab 04/14/24 1531  INR 1.4*    Cardiac Enzymes: No results for input(s): CKTOTAL, CKMB, CKMBINDEX, TROPONINI in the last 168 hours.  HbA1C: Hgb A1c MFr Bld  Date/Time Value Ref Range Status  06/13/2007 12:05 AM   Final   5.1 (NOTE)   The ADA recommends the following therapeutic goals for glycemic  control related to Hgb A1C measurement:   Goal of Therapy:   < 7.0% Hgb A1C   Action Suggested:  > 8.0% Hgb A1C   Ref:  Diabetes Care, 22, Suppl. 1, 1999    CBG: Recent Labs  Lab 04/14/24 1508 04/14/24 1510 04/14/24 1732  GLUCAP <10* <10* 121*    Review of Systems:   See HPI   Past Medical History:  He,  has a past medical history of Anxiety, Back pain, Fracture of rib of right side (01/26/2015), and Hypertension.   Surgical History:   Past Surgical History:  Procedure Laterality Date   TEE WITHOUT CARDIOVERSION N/A 01/21/2014   Procedure: TRANSESOPHAGEAL ECHOCARDIOGRAM (TEE) with propofol ;  Surgeon: Dorn JULIANNA Ross, MD;  Location: [facility];  Service: Endoscopy;  Laterality: N/A;     Social History:   reports that he has been smoking cigarettes. He has never used smokeless tobacco. He reports current drug use. Drug: Heroin. He reports that he does not drink alcohol.   Family History:  His family history is not on file.   Allergies Allergies  Allergen Reactions   Penicillins Other (See Comments)    Unknown      Home Medications  Prior to Admission medications   Medication Sig Start Date End Date Taking? Authorizing Provider  ALPRAZolam  (XANAX ) 1 MG tablet Take 1 mg by mouth in the morning, at noon, in the evening, and at bedtime.    [provider]  Oxycodone  HCl 10 MG TABS  Take 1 tablet by mouth 3 (three) times daily.    [provider]     Critical care time: 60 mins    [facility]   [facility] 04/14/2024, 7:47 PM  Please see [email or web address] for pager details.  From 7A-7P if no response, please call [phone]. After hours, please call ELink [phone].

## 2024-04-14 NOTE — Progress Notes (Signed)
 ABG 04/14/24 21:27  pH 7.314 PCO2 45.8 PO2 139 HCO3 23.3

## 2024-04-14 NOTE — Progress Notes (Signed)
   PCCM transfer request    Sending physician: Zammit- EDP  Sending facility: [facility] ED  Reason for transfer: sepsis, respiratory failure on MV  Brief case summary: 48 y/o M with recent admission for MSSA pneumonia, bacteremia,OM L-spine > Left AMA. He was supposed to be on Ancef  but has not been getting antibiotics since discharge. Today his wife found with AMS; in the ED he had to be intubated for airway protection. Had hypoglycemia but treatment of this did not improve mental status.  WBC 20K, LA 5.9 T bili 4.1, jaundiced AKI, Cr 3.4 Not on pressors currently  Recommendations made prior to transfer: none  Transfer accepted: yes- [facility]U    [name] [name] 04/14/24 5:58 PM [facility]  For contact information, see Amion. If no response to pager, please call PCCM consult pager. After hours, 7PM- 7AM, please call Elink.

## 2024-04-14 NOTE — ED Provider Notes (Signed)
 Date/Time: 04/14/2024 5:00 PM  Performed by: Suzette Pac, MDPre-anesthesia Checklist: Patient identified Oxygen Delivery Method: Nasal cannula Preoxygenation: Pre-oxygenation with 100% oxygen Induction Type: Rapid sequence Ventilation: Mask ventilation without difficulty Laryngoscope Size: Glidescope Grade View: Grade I Tube type: Non-subglottic suction tube Number of attempts: 1 Airway Equipment and Method: Stylet Placement Confirmation: ETT inserted through vocal cords under direct vision, Positive ETCO2 and Breath sounds checked- equal and bilateral Tube secured with: Tape Comments: Patient was given etomidate and succinylcholine  and was intubated on the first try with a 7-1/2 to.  No difficulties with intubation        Suzette Pac, MD 04/14/24 1702

## 2024-04-14 NOTE — ED Notes (Signed)
 Pt intubated and waiting to be transferred to [facility].

## 2024-04-15 ENCOUNTER — Inpatient Hospital Stay (HOSPITAL_COMMUNITY): Payer: MEDICAID

## 2024-04-15 DIAGNOSIS — I469 Cardiac arrest, cause unspecified: Secondary | ICD-10-CM

## 2024-04-15 DIAGNOSIS — A4101 Sepsis due to Methicillin susceptible Staphylococcus aureus: Secondary | ICD-10-CM

## 2024-04-15 DIAGNOSIS — R9431 Abnormal electrocardiogram [ECG] [EKG]: Secondary | ICD-10-CM

## 2024-04-15 DIAGNOSIS — E875 Hyperkalemia: Secondary | ICD-10-CM

## 2024-04-15 DIAGNOSIS — J15211 Pneumonia due to Methicillin susceptible Staphylococcus aureus: Secondary | ICD-10-CM

## 2024-04-15 DIAGNOSIS — R579 Shock, unspecified: Secondary | ICD-10-CM

## 2024-04-15 DIAGNOSIS — I079 Rheumatic tricuspid valve disease, unspecified: Secondary | ICD-10-CM

## 2024-04-15 DIAGNOSIS — M4626 Osteomyelitis of vertebra, lumbar region: Secondary | ICD-10-CM

## 2024-04-15 LAB — POCT I-STAT 7, (LYTES, BLD GAS, ICA,H+H)
Acid-base deficit: 2 mmol/L (ref 0.0–2.0)
Bicarbonate: 25.4 mmol/L (ref 20.0–28.0)
Calcium, Ion: 1.18 mmol/L (ref 1.15–1.40)
HCT: 24 % — ABNORMAL LOW (ref 39.0–52.0)
Hemoglobin: 8.2 g/dL — ABNORMAL LOW (ref 13.0–17.0)
O2 Saturation: 96 %
Patient temperature: 98.1
Potassium: 5.2 mmol/L — ABNORMAL HIGH (ref 3.5–5.1)
Sodium: 141 mmol/L (ref 135–145)
TCO2: 27 mmol/L (ref 22–32)
pCO2 arterial: 60.6 mmHg — ABNORMAL HIGH (ref 32–48)
pH, Arterial: 7.229 — ABNORMAL LOW (ref 7.35–7.45)
pO2, Arterial: 101 mmHg (ref 83–108)

## 2024-04-15 LAB — DIC (DISSEMINATED INTRAVASCULAR COAGULATION)PANEL
D-Dimer, Quant: >20 ug{FEU}/mL — ABNORMAL HIGH (ref 0.00–0.50)
D-Dimer, Quant: >20 ug{FEU}/mL — ABNORMAL HIGH (ref 0.00–0.50)
Fibrinogen: 140 mg/dL — ABNORMAL LOW (ref 210–475)
Fibrinogen: 138 mg/dL — ABNORMAL LOW (ref 210–475)
INR: 1.5 — ABNORMAL HIGH (ref 0.8–1.2)
INR: 1.4 — ABNORMAL HIGH (ref 0.8–1.2)
Platelets: 14 K/uL — CL (ref 150–400)
Platelets: 39 K/uL — ABNORMAL LOW (ref 150–400)
Prothrombin Time: 18.6 s — ABNORMAL HIGH (ref 11.4–15.2)
Prothrombin Time: 18.4 s — ABNORMAL HIGH (ref 11.4–15.2)
Smear Review: NONE SEEN
Smear Review: NONE SEEN
aPTT: 33 s (ref 24–36)
aPTT: 36 s (ref 24–36)

## 2024-04-15 LAB — BASIC METABOLIC PANEL WITH GFR
Anion gap: 15 (ref 5–15)
BUN: 101 mg/dL — ABNORMAL HIGH (ref 6–20)
CO2: 23 mmol/L (ref 22–32)
Calcium: 8.6 mg/dL — ABNORMAL LOW (ref 8.9–10.3)
Chloride: 105 mmol/L (ref 98–111)
Creatinine, Ser: 3.62 mg/dL — ABNORMAL HIGH (ref 0.61–1.24)
GFR, Estimated: 20 mL/min — ABNORMAL LOW (ref >60)
Glucose, Bld: 139 mg/dL — ABNORMAL HIGH (ref 70–99)
Potassium: 5.3 mmol/L — ABNORMAL HIGH (ref 3.5–5.1)
Sodium: 143 mmol/L (ref 135–145)

## 2024-04-15 LAB — CBC
HCT: 20.1 % — ABNORMAL LOW (ref 39.0–52.0)
HCT: 24.4 % — ABNORMAL LOW (ref 39.0–52.0)
Hemoglobin: 6 g/dL — CL (ref 13.0–17.0)
Hemoglobin: 7.7 g/dL — ABNORMAL LOW (ref 13.0–17.0)
MCH: 26.7 pg (ref 26.0–34.0)
MCH: 28.5 pg (ref 26.0–34.0)
MCHC: 29.9 g/dL — ABNORMAL LOW (ref 30.0–36.0)
MCHC: 31.6 g/dL (ref 30.0–36.0)
MCV: 89.3 fL (ref 80.0–100.0)
MCV: 90.4 fL (ref 80.0–100.0)
Platelets: 38 K/uL — ABNORMAL LOW (ref 150–400)
Platelets: 39 K/uL — ABNORMAL LOW (ref 150–400)
RBC: 2.25 MIL/uL — ABNORMAL LOW (ref 4.22–5.81)
RBC: 2.7 MIL/uL — ABNORMAL LOW (ref 4.22–5.81)
RDW: 18.3 % — ABNORMAL HIGH (ref 11.5–15.5)
RDW: 16.4 % — ABNORMAL HIGH (ref 11.5–15.5)
WBC: 25.5 K/uL — ABNORMAL HIGH (ref 4.0–10.5)
WBC: 24.4 K/uL — ABNORMAL HIGH (ref 4.0–10.5)
nRBC: 0.2 % (ref 0.0–0.2)
nRBC: 0.2 % (ref 0.0–0.2)

## 2024-04-15 LAB — PHOSPHORUS: Phosphorus: 6.7 mg/dL — ABNORMAL HIGH (ref 2.5–4.6)

## 2024-04-15 LAB — COMPREHENSIVE METABOLIC PANEL WITH GFR
ALT: 8 U/L (ref 0–44)
AST: 23 U/L (ref 15–41)
Albumin: 1.6 g/dL — ABNORMAL LOW (ref 3.5–5.0)
Alkaline Phosphatase: 247 U/L — ABNORMAL HIGH (ref 38–126)
Anion gap: 15 (ref 5–15)
BUN: 92 mg/dL — ABNORMAL HIGH (ref 6–20)
CO2: 22 mmol/L (ref 22–32)
Calcium: 7.7 mg/dL — ABNORMAL LOW (ref 8.9–10.3)
Chloride: 100 mmol/L (ref 98–111)
Creatinine, Ser: 3.6 mg/dL — ABNORMAL HIGH (ref 0.61–1.24)
GFR, Estimated: 20 mL/min — ABNORMAL LOW (ref >60)
Glucose, Bld: 150 mg/dL — ABNORMAL HIGH (ref 70–99)
Potassium: 5.3 mmol/L — ABNORMAL HIGH (ref 3.5–5.1)
Sodium: 137 mmol/L (ref 135–145)
Total Bilirubin: 4.9 mg/dL — ABNORMAL HIGH (ref 0.0–1.2)
Total Protein: 6 g/dL — ABNORMAL LOW (ref 6.5–8.1)

## 2024-04-15 LAB — AMYLASE: Amylase: 43 U/L (ref 28–100)

## 2024-04-15 LAB — BLOOD CULTURE ID PANEL (REFLEXED) - BCID2

## 2024-04-15 LAB — ETHANOL: Alcohol, Ethyl (B): <15 mg/dL (ref <15)

## 2024-04-15 LAB — SALICYLATE LEVEL: Salicylate Lvl: <7 mg/dL — ABNORMAL LOW (ref 7.0–30.0)

## 2024-04-15 LAB — GLUCOSE, CAPILLARY
Glucose-Capillary: 122 mg/dL — ABNORMAL HIGH (ref 70–99)
Glucose-Capillary: 62 mg/dL — ABNORMAL LOW (ref 70–99)
Glucose-Capillary: 106 mg/dL — ABNORMAL HIGH (ref 70–99)
Glucose-Capillary: 93 mg/dL (ref 70–99)
Glucose-Capillary: 125 mg/dL — ABNORMAL HIGH (ref 70–99)
Glucose-Capillary: 106 mg/dL — ABNORMAL HIGH (ref 70–99)

## 2024-04-15 LAB — BLOOD GAS, ARTERIAL
Acid-base deficit: 2.8 mmol/L — ABNORMAL HIGH (ref 0.0–2.0)
Bicarbonate: 23.2 mmol/L (ref 20.0–28.0)
O2 Saturation: 99.4 %
Patient temperature: 36.6
pCO2 arterial: 43 mmHg (ref 32–48)
pH, Arterial: 7.34 — ABNORMAL LOW (ref 7.35–7.45)
pO2, Arterial: 162 mmHg — ABNORMAL HIGH (ref 83–108)

## 2024-04-15 LAB — ECHOCARDIOGRAM COMPLETE
AV Mean grad: 3 mmHg
AV Peak grad: 6.4 mmHg
Ao pk vel: 1.26 m/s
Area-P 1/2: 6.22 cm2
Calc EF: 43.4 %
Height: 72 in
S' Lateral: 3.4 cm
Single Plane A2C EF: 44.2 %
Single Plane A4C EF: 46.1 %
Weight: 2271.62 [oz_av]

## 2024-04-15 LAB — TRIGLYCERIDES: Triglycerides: 191 mg/dL — ABNORMAL HIGH (ref <150)

## 2024-04-15 LAB — STREP PNEUMONIAE URINARY ANTIGEN: Strep Pneumo Urinary Antigen: NEGATIVE

## 2024-04-15 LAB — LIPASE, BLOOD: Lipase: 22 U/L (ref 11–51)

## 2024-04-15 LAB — CK: Total CK: 14 U/L — ABNORMAL LOW (ref 49–397)

## 2024-04-15 LAB — RAPID URINE DRUG SCREEN, HOSP PERFORMED
Amphetamines: NOT DETECTED
Barbiturates: NOT DETECTED
Benzodiazepines: POSITIVE — AB
Cocaine: POSITIVE — AB
Opiates: NOT DETECTED
Tetrahydrocannabinol: NOT DETECTED

## 2024-04-15 LAB — MAGNESIUM: Magnesium: 2.3 mg/dL (ref 1.7–2.4)

## 2024-04-15 LAB — PREPARE RBC (CROSSMATCH)

## 2024-04-15 LAB — LACTIC ACID, PLASMA: Lactic Acid, Venous: 2.3 mmol/L (ref 0.5–1.9)

## 2024-04-15 LAB — ACETAMINOPHEN LEVEL: Acetaminophen (Tylenol), Serum: <10 ug/mL — ABNORMAL LOW (ref 10–30)

## 2024-04-15 LAB — VANCOMYCIN, RANDOM: Vancomycin Rm: 12 ug/mL

## 2024-04-15 MED ORDER — THIAMINE HCL 100 MG/ML IJ SOLN
500.0000 mg | Freq: Three times a day (TID) | INTRAVENOUS | Status: AC
  Administered 2024-04-15 – 2024-04-18 (×9): 500 mg via INTRAVENOUS
  Filled 2024-04-15 (×7): qty 5
  Filled 2024-04-15: qty 500
  Filled 2024-04-15: qty 5

## 2024-04-15 MED ORDER — ALBUMIN HUMAN 25 % IV SOLN
25.0000 g | Freq: Four times a day (QID) | INTRAVENOUS | Status: AC
Start: 2024-04-15 — End: 2024-04-16
  Administered 2024-04-15 – 2024-04-16 (×4): 25 g via INTRAVENOUS
  Filled 2024-04-15 (×4): qty 100

## 2024-04-15 MED ORDER — SODIUM ZIRCONIUM CYCLOSILICATE 10 G PO PACK: 10.0000 g | PACK | Freq: Once | ORAL | Status: DC

## 2024-04-15 MED ORDER — KETAMINE HCL 50 MG/5ML IJ SOSY
PREFILLED_SYRINGE | INTRAMUSCULAR | Status: AC
  Filled 2024-04-15: qty 10

## 2024-04-15 MED ORDER — NOREPINEPHRINE 4 MG/250ML-% IV SOLN
0.0000 ug/min | INTRAVENOUS | Status: DC
  Administered 2024-04-15: 2 ug/min via INTRAVENOUS

## 2024-04-15 MED ORDER — POLYETHYLENE GLYCOL 3350 17 G PO PACK
17.0000 g | PACK | Freq: Every day | ORAL | Status: DC
  Administered 2024-04-16 – 2024-04-20 (×2): 17 g
  Filled 2024-04-15 (×3): qty 1

## 2024-04-15 MED ORDER — MIDAZOLAM HCL 2 MG/2ML IJ SOLN
INTRAMUSCULAR | Status: AC
Start: 2024-04-15 — End: 2024-04-15
  Administered 2024-04-15: 2 mg
  Filled 2024-04-15: qty 2

## 2024-04-15 MED ORDER — SODIUM ZIRCONIUM CYCLOSILICATE 10 G PO PACK
10.0000 g | PACK | Freq: Once | ORAL | Status: AC
  Administered 2024-04-15: 10 g
  Filled 2024-04-15: qty 1

## 2024-04-15 MED ORDER — FENTANYL CITRATE (PF) 50 MCG/ML IJ SOSY
PREFILLED_SYRINGE | INTRAMUSCULAR | Status: AC
  Administered 2024-04-15: 50 ug
  Filled 2024-04-15: qty 2

## 2024-04-15 MED ORDER — ORAL CARE MOUTH RINSE
15.0000 mL | OROMUCOSAL | Status: DC
  Administered 2024-04-15 – 2024-04-21 (×71): 15 mL via OROMUCOSAL

## 2024-04-15 MED ORDER — MIDAZOLAM HCL (PF) 2 MG/2ML IJ SOLN: 4.0000 mg | Freq: Once | INTRAMUSCULAR | Status: DC

## 2024-04-15 MED ORDER — FOLIC ACID 5 MG/ML IJ SOLN
1.0000 mg | Freq: Every day | INTRAMUSCULAR | Status: DC
  Administered 2024-04-15 – 2024-04-18 (×4): 1 mg via INTRAVENOUS
  Filled 2024-04-15 (×5): qty 0.2

## 2024-04-15 MED ORDER — ORAL CARE MOUTH RINSE
15.0000 mL | OROMUCOSAL | Status: DC | PRN
Start: 2024-04-15 — End: 2024-04-21

## 2024-04-15 MED ORDER — VITAL 1.5 CAL PO LIQD
1000.0000 mL | ORAL | Status: DC
  Administered 2024-04-15: 1000 mL

## 2024-04-15 MED ORDER — CALCIUM CHLORIDE 10 % IV SOLN
1.0000 g | Freq: Once | INTRAVENOUS | Status: AC
  Administered 2024-04-15: 1 g via INTRAVENOUS

## 2024-04-15 MED ORDER — IPRATROPIUM-ALBUTEROL 0.5-2.5 (3) MG/3ML IN SOLN
3.0000 mL | Freq: Four times a day (QID) | RESPIRATORY_TRACT | Status: DC
  Administered 2024-04-15 – 2024-04-20 (×19): 3 mL via RESPIRATORY_TRACT
  Filled 2024-04-15: qty 39
  Filled 2024-04-15 (×20): qty 3

## 2024-04-15 MED ORDER — ORAL CARE MOUTH RINSE
15.0000 mL | OROMUCOSAL | Status: DC
Start: 2024-04-15 — End: 2024-04-15

## 2024-04-15 MED ORDER — ADULT MULTIVITAMIN W/MINERALS CH
1.0000 | ORAL_TABLET | Freq: Every day | ORAL | Status: DC
  Administered 2024-04-15 – 2024-04-21 (×7): 1
  Filled 2024-04-15 (×7): qty 1

## 2024-04-15 MED ORDER — ORAL CARE MOUTH RINSE: 15.0000 mL | OROMUCOSAL | Status: DC | PRN

## 2024-04-15 MED ORDER — NOREPINEPHRINE 16 MG/250ML-% IV SOLN
0.0000 ug/min | INTRAVENOUS | Status: DC
  Administered 2024-04-15: 5 ug/min via INTRAVENOUS
  Filled 2024-04-15: qty 250

## 2024-04-15 MED ORDER — DOCUSATE SODIUM 50 MG/5ML PO LIQD
100.0000 mg | Freq: Two times a day (BID) | ORAL | Status: DC
  Administered 2024-04-15 – 2024-04-20 (×8): 100 mg
  Filled 2024-04-15 (×9): qty 10

## 2024-04-15 MED ORDER — ETOMIDATE 2 MG/ML IV SOLN: 10.0000 mg | Freq: Once | INTRAVENOUS | Status: AC

## 2024-04-15 MED ORDER — FENTANYL 2500MCG IN NS 250ML (10MCG/ML) PREMIX INFUSION
0.0000 ug/h | INTRAVENOUS | Status: DC
  Administered 2024-04-15: 25 ug/h via INTRAVENOUS
  Administered 2024-04-17: 125 ug/h via INTRAVENOUS
  Administered 2024-04-17: 150 ug/h via INTRAVENOUS
  Administered 2024-04-18: 50 ug/h via INTRAVENOUS
  Administered 2024-04-19: 75 ug/h via INTRAVENOUS
  Administered 2024-04-20 – 2024-04-21 (×2): 125 ug/h via INTRAVENOUS
  Filled 2024-04-15 (×7): qty 250

## 2024-04-15 MED ORDER — ETOMIDATE 2 MG/ML IV SOLN
INTRAVENOUS | Status: AC
  Administered 2024-04-15: 10 mg
  Filled 2024-04-15: qty 20

## 2024-04-15 MED ORDER — SODIUM CHLORIDE 0.9% IV SOLUTION: Freq: Once | INTRAVENOUS | Status: AC

## 2024-04-15 MED ORDER — PERFLUTREN LIPID MICROSPHERE
1.0000 mL | INTRAVENOUS | Status: AC | PRN
  Administered 2024-04-15: 4 mL via INTRAVENOUS

## 2024-04-15 MED ORDER — ORAL CARE MOUTH RINSE
15.0000 mL | OROMUCOSAL | Status: DC
  Administered 2024-04-15 (×6): 15 mL via OROMUCOSAL

## 2024-04-15 MED ORDER — MIDAZOLAM HCL 2 MG/2ML IJ SOLN
INTRAMUSCULAR | Status: AC
  Filled 2024-04-15: qty 2

## 2024-04-15 MED ORDER — MIDAZOLAM HCL (PF) 2 MG/2ML IJ SOLN: 2.0000 mg | Freq: Once | INTRAMUSCULAR | Status: AC

## 2024-04-15 MED ORDER — FENTANYL CITRATE (PF) 50 MCG/ML IJ SOSY: 50.0000 ug | PREFILLED_SYRINGE | Freq: Once | INTRAMUSCULAR | Status: AC

## 2024-04-15 MED ORDER — NOREPINEPHRINE 4 MG/250ML-% IV SOLN
INTRAVENOUS | Status: AC
  Filled 2024-04-15: qty 250

## 2024-04-15 MED ORDER — PHENYLEPHRINE 80 MCG/ML (10ML) SYRINGE FOR IV PUSH (FOR BLOOD PRESSURE SUPPORT)
PREFILLED_SYRINGE | INTRAVENOUS | Status: AC
  Filled 2024-04-15: qty 10

## 2024-04-15 MED ORDER — SODIUM BICARBONATE 8.4 % IV SOLN
50.0000 meq | Freq: Once | INTRAVENOUS | Status: AC
  Administered 2024-04-15: 50 meq via INTRAVENOUS

## 2024-04-15 MED ORDER — VASOPRESSIN 20 UNITS/100 ML INFUSION FOR SHOCK
0.0000 [IU]/min | INTRAVENOUS | Status: DC
  Administered 2024-04-15: 0.04 [IU]/min via INTRAVENOUS
  Filled 2024-04-15: qty 100

## 2024-04-15 MED ORDER — ROCURONIUM BROMIDE 10 MG/ML (PF) SYRINGE
PREFILLED_SYRINGE | INTRAVENOUS | Status: AC
Start: 2024-04-15 — End: 2024-04-15
  Filled 2024-04-15: qty 10

## 2024-04-15 MED ORDER — CEFAZOLIN SODIUM-DEXTROSE 2-4 GM/100ML-% IV SOLN: 2.0000 g | Freq: Three times a day (TID) | INTRAVENOUS | Status: DC

## 2024-04-15 MED ORDER — FENTANYL BOLUS VIA INFUSION
25.0000 ug | INTRAVENOUS | Status: DC | PRN
  Administered 2024-04-15 – 2024-04-16 (×8): 100 ug via INTRAVENOUS
  Administered 2024-04-17: 50 ug via INTRAVENOUS
  Administered 2024-04-17 (×3): 100 ug via INTRAVENOUS
  Administered 2024-04-18: 50 ug via INTRAVENOUS
  Administered 2024-04-18 (×4): 100 ug via INTRAVENOUS
  Administered 2024-04-18 (×3): 50 ug via INTRAVENOUS
  Administered 2024-04-19 (×3): 100 ug via INTRAVENOUS
  Administered 2024-04-19: 50 ug via INTRAVENOUS
  Administered 2024-04-19: 75 ug via INTRAVENOUS
  Administered 2024-04-19 (×3): 50 ug via INTRAVENOUS
  Administered 2024-04-19: 100 ug via INTRAVENOUS
  Administered 2024-04-19 (×2): 50 ug via INTRAVENOUS
  Administered 2024-04-20 – 2024-04-21 (×15): 100 ug via INTRAVENOUS

## 2024-04-15 MED ORDER — IOHEXOL 350 MG/ML SOLN
60.0000 mL | Freq: Once | INTRAVENOUS | Status: AC | PRN
  Administered 2024-04-15: 60 mL via INTRAVENOUS

## 2024-04-15 MED ORDER — CEFAZOLIN SODIUM-DEXTROSE 2-4 GM/100ML-% IV SOLN
2.0000 g | Freq: Two times a day (BID) | INTRAVENOUS | Status: DC
  Administered 2024-04-15 – 2024-04-19 (×8): 2 g via INTRAVENOUS
  Filled 2024-04-15 (×8): qty 100

## 2024-04-15 MED ORDER — CALCIUM CHLORIDE 10 % IV SOLN: 2.0000 g | Freq: Once | INTRAVENOUS | Status: DC

## 2024-04-15 MED ORDER — ROCURONIUM BROMIDE 10 MG/ML (PF) SYRINGE
50.0000 mg | PREFILLED_SYRINGE | Freq: Once | INTRAVENOUS | Status: AC
  Administered 2024-04-15: 50 mg via INTRAVENOUS

## 2024-04-15 NOTE — Consult Note (Signed)
 WOC Nurse Consult Note: Reason for Consult: Requested to assess a DTI on sacrum. Wound type: pressure injury stage 2. Pressure Injury POA: Yes Measurement: 2 ruptured blisters 2 cm x 1 cm x 0.1 cm and 0.5 cm x 0.5 cm x 0.1 cm. Wound bed: 100% pink (partial thickness) Drainage (amount, consistency, odor) Scant amount, serous, no odor. Periwound: intact, non-blanchable redness.  Dressing procedure/placement/frequency: Cleanse with saline, pat dry. Apply Xeroform to the wound bed daily. Top with sacrum foam dressing. The foam can remain 3 days if no saturated or soiled.    WOC team will not plan to follow further. Please reconsult if further assistance is needed. Thank-you,  Lela Holm MSN, RN, CNS.  (Phone [phone])

## 2024-04-15 NOTE — Procedures (Signed)
 Intubation Procedure Note  [name] [name]  [id number]  1976/04/22  Date:04/15/24  Time:1:39 PM   Provider Performing:Jerie Basford D. Harris    Procedure: Intubation (31500)  Indication(s) Respiratory Failure  Consent Unable to obtain consent due to emergent nature of procedure.   Anesthesia Etomidate, Versed , Fentanyl , and Rocuronium   Time Out Verified patient identification, verified procedure, site/side was marked, verified correct patient position, special equipment/implants available, medications/allergies/relevant history reviewed, required imaging and test results available.   Sterile Technique Usual hand hygeine, masks, and gloves were used   Procedure Description Patient positioned in bed supine.  Sedation given as noted above.  Patient was intubated with endotracheal tube using Glidescope.  View was Grade 1 full glottis .  Number of attempts was 3.  Colorimetric CO2 detector was consistent with tracheal placement.   Complications/Tolerance None; patient tolerated the procedure well. Chest X-ray is ordered to verify placement.   EBL Minimal   Specimen(s) None  Chasten Blaze D. Harris, NP-C [facility] Personal contact information can be found on Amion  If no contact or response made please call [phone] 04/15/2024, 1:39 PM

## 2024-04-15 NOTE — Consult Note (Signed)
 [facility]  Total days of antibiotics 2       Reason for Consult: staph aureus bacteremia    Referring Physician: [email or web address]  Principal Problem:   Sepsis (HCC) Active Problems:   Severe sepsis (HCC)    HPI: [name] is a 48 y.o. male recently admitted with MSSA PNA MSSA bacteremia psoas abscesses and lumbar osteo. Originally presented on 03/14/24-03/25/24 found to have multi focal septic lung emboli, small pericaridal effusion, lumbar OM in the setting of MSSA bacteremia but left AMA. He now represents to the [facility] ED on 11/2 with AMS, poor airway protection requiring intubation, fluid resuscitation for lactic acidosis. He was emperically started on vancomycin  and meropenem. Labs significant for leukocytosis of 20K, thrombocytopenia of 57, hgb 7.0(baseline), cr 4 (BL 0.8),and LA 5.9.  Blood cx quickly became positive, identifiying MSSA. Urine cx showing 60K MSSA. NCHCT showing chronic maxillary sinusitis. Thus abtx narrowed to cefazolin  2gm Q12hr. He is now extubated and supplemental oxygen. He reports that his back pain also causing some leg weakness  Past Medical History:  Diagnosis Date   Anxiety    Back pain    Fracture of rib of right side 01/26/2015   Hypertension     Allergies:  Allergies  Allergen Reactions   Penicillins Other (See Comments)    Unknown     MEDICATIONS:  Chlorhexidine Gluconate Cloth  6 each Topical Daily   dextrose   1 ampule Intravenous Once   mouth rinse  15 mL Mouth Rinse Q2H   pantoprazole (PROTONIX) IV  40 mg Intravenous QHS   sodium zirconium cyclosilicate  10 g Per Tube Once    Social History   Tobacco Use   Smoking status: Every Day    Current packs/day: 0.50    Types: Cigarettes   Smokeless tobacco: Never  Substance Use Topics   Alcohol use: No    Comment: rare   Drug use: Yes    Types: Heroin    Comment: reports history of cocaine abuse.-last used years ago    No family history on file.  Review  of Systems -  +back pain. Leg weakness. 12 point ros is negative  OBJECTIVE: Temp:  [97.2 F (36.2 C)-99.7 F (37.6 C)] 98.2 F (36.8 C) (11/03 1015) Pulse Rate:  [90-191] 106 (11/03 1015) Resp:  [15-42] 25 (11/03 1015) BP: (72-141)/(51-106) 121/80 (11/03 1015) SpO2:  [64 %-100 %] 91 % (11/03 1021) FiO2 (%):  [40 %-60 %] 40 % (11/03 0748) Weight:  [62.5 kg-64.4 kg] 64.4 kg (11/03 0336) Physical Exam  Constitutional: He is oriented to person, place, and time. He appears well-developed and well-nourished. No distress.  HENT:  Mouth/Throat: Oropharynx is clear and moist. No oropharyngeal exudate.  Cardiovascular: Normal rate, regular rhythm and normal heart sounds. Exam reveals no gallop and no friction rub.  No murmur heard.  Pulmonary/Chest: Effort normal and breath sounds normal. No respiratory distress. He has no wheezes.  Abdominal: Soft. Bowel sounds are normal. He exhibits no distension. There is no tenderness.  Lymphadenopathy:  He has no cervical adenopathy.  Neurological: He is alert and oriented to person, place, and time.  Skin: Skin is warm and dry. Injection sites/scarring Psychiatric: He has a normal mood and affect. His behavior is normal.   LABS: Results for orders placed or performed during the hospital encounter of 04/14/24 (from the past 48 hours)  CBG monitoring, ED     Status: Abnormal   Collection Time: 04/14/24  3:08  PM  Result Value Ref Range   Glucose-Capillary <10 (LL) 70 - 99 mg/dL    Comment: Glucose reference range applies only to samples taken after fasting for at least 8 hours.  CBG monitoring, ED     Status: Abnormal   Collection Time: 04/14/24  3:10 PM  Result Value Ref Range   Glucose-Capillary <10 (LL) 70 - 99 mg/dL    Comment: Glucose reference range applies only to samples taken after fasting for at least 8 hours.  I-stat chem 8, ed     Status: Abnormal   Collection Time: 04/14/24  3:27 PM  Result Value Ref Range   Sodium 138 135 - 145  mmol/L   Potassium 4.8 3.5 - 5.1 mmol/L   Chloride 106 98 - 111 mmol/L   BUN 88 (H) 6 - 20 mg/dL   Creatinine, Ser 5.99 (H) 0.61 - 1.24 mg/dL   Glucose, Bld 892 (H) 70 - 99 mg/dL    Comment: Glucose reference range applies only to samples taken after fasting for at least 8 hours.   Calcium, Ion 1.11 (L) 1.15 - 1.40 mmol/L   TCO2 19 (L) 22 - 32 mmol/L   Hemoglobin 7.8 (L) 13.0 - 17.0 g/dL   HCT 76.9 (L) 60.9 - 47.9 %  Comprehensive metabolic panel     Status: Abnormal   Collection Time: 04/14/24  3:31 PM  Result Value Ref Range   Sodium 140 135 - 145 mmol/L    Comment: Electrolytes repeated to verify    Potassium 4.9 3.5 - 5.1 mmol/L   Chloride 99 98 - 111 mmol/L   CO2 20 (L) 22 - 32 mmol/L   Glucose, Bld 109 (H) 70 - 99 mg/dL    Comment: Glucose reference range applies only to samples taken after fasting for at least 8 hours.   BUN 83 (H) 6 - 20 mg/dL   Creatinine, Ser 6.59 (H) 0.61 - 1.24 mg/dL   Calcium 8.9 8.9 - 89.6 mg/dL   Total Protein 7.3 6.5 - 8.1 g/dL   Albumin 2.7 (L) 3.5 - 5.0 g/dL   AST 35 15 - 41 U/L   ALT 6 0 - 44 U/L   Alkaline Phosphatase 372 (H) 38 - 126 U/L   Total Bilirubin 4.1 (H) 0.0 - 1.2 mg/dL   GFR, Estimated 22 (L) >60 mL/min    Comment: (NOTE) Calculated using the CKD-EPI Creatinine Equation (2021)    Anion gap 21 (H) 5 - 15    Comment: Performed at [facility], [address] [place], [place] [address]  CBC     Status: Abnormal   Collection Time: 04/14/24  3:31 PM  Result Value Ref Range   WBC 20.0 (H) 4.0 - 10.5 K/uL   RBC 2.84 (L) 4.22 - 5.81 MIL/uL   Hemoglobin 7.7 (L) 13.0 - 17.0 g/dL   HCT 74.3 (L) 60.9 - 47.9 %   MCV 90.1 80.0 - 100.0 fL   MCH 27.1 26.0 - 34.0 pg   MCHC 30.1 30.0 - 36.0 g/dL   RDW 82.0 (H) 88.4 - 84.4 %   Platelets 57 (L) 150 - 400 K/uL    Comment: SPECIMEN CHECKED FOR CLOTS PLATELET COUNT CONFIRMED BY SMEAR REPEATED TO VERIFY Immature Platelet Fraction may be clinically indicated, consider ordering this  additional test [id number]    nRBC 0.3 (H) 0.0 - 0.2 %    Comment: Performed at [facility], [address] [place], [place] [address]  Lactic acid, plasma  Status: Abnormal   Collection Time: 04/14/24  3:31 PM  Result Value Ref Range   Lactic Acid, Venous 5.9 (HH) 0.5 - 1.9 mmol/L    Comment: Critical Value, Read Back and verified with ALLEN C. ON 04/14/2024 @4 :05PM BY T. HAMER  Performed at [facility], [address] [place], [place] [address]   Protime-INR     Status: Abnormal   Collection Time: 04/14/24  3:31 PM  Result Value Ref Range   Prothrombin Time 17.9 (H) 11.4 - 15.2 seconds   INR 1.4 (H) 0.8 - 1.2    Comment: (NOTE) INR goal varies based on device and disease states. Performed at [facility], [address], [place], [place] [address]   Blood Culture (routine x 2)     Status: None (Preliminary result)   Collection Time: 04/14/24  3:31 PM   Specimen: BLOOD  Result Value Ref Range   Specimen Description      BLOOD LEFT ANTECUBITAL Performed at [facility], [address], [place], [place] [address]    Special Requests      BOTTLES DRAWN AEROBIC AND ANAEROBIC Blood Culture adequate volume Performed at [facility], [address] [place], [place] [address]    Culture  Setup Time      GRAM POSITIVE COCCI IN BOTH AEROBIC AND ANAEROBIC BOTTLES CRITICAL RESULT CALLED TO, READ BACK BY AND VERIFIED WITH: PRUITT,G ON 04/15/24 AT 0102 BY PURDIE,J CRITICAL RESULT CALLED TO, READ BACK BY AND VERIFIED WITH: PHARMD M.MICHELLE AT 0743N 04/15/2024 BY T.SAAD. Performed at [facility], [address], [place], [place] [address]    Culture GRAM POSITIVE COCCI    Report Status PENDING   Ammonia     Status: None   Collection Time: 04/14/24  3:31 PM  Result Value Ref Range   Ammonia 17 9 - 35 umol/L    Comment: Performed at [facility], [address] [place], [place] [address]  Troponin T, High Sensitivity     Status: Abnormal   Collection Time: 04/14/24  3:31 PM   Result Value Ref Range   Troponin T High Sensitivity 55 (H) 0 - 19 ng/L    Comment: (NOTE) Biotin concentrations > 1000 ng/mL falsely decrease TnT results.  Serial cardiac troponin measurements are suggested.  Refer to the Links section for chest pain algorithms and additional  guidance. Performed at [facility], [address], [place], [place] [address]   Blood gas, venous     Status: Abnormal   Collection Time: 04/14/24  3:31 PM  Result Value Ref Range   pH, Ven 7.37 7.25 - 7.43   pCO2, Ven 37 (L) 44 - 60 mmHg   pO2, Ven <31 (LL) 32 - 45 mmHg    Comment: CRITICAL RESULT CALLED TO, READ BACK BY AND VERIFIED WITH: BELTON C @ 1556 ON [id number] BY HENDERSON L    Bicarbonate 20.8 20.0 - 28.0 mmol/L   Acid-base deficit 3.9 (H) 0.0 - 2.0 mmol/L   O2 Saturation 24.3 %   Patient temperature 37.3    Collection site LEFT ANTECUBITAL    Drawn by [phone]     Comment: Performed at [facility], [address] [place], [place] [address]  Blood Culture ID Panel (Reflexed)     Status: Abnormal   Collection Time: 04/14/24  3:31 PM  Result Value Ref Range   Enterococcus faecalis NOT DETECTED NOT DETECTED   Enterococcus Faecium NOT DETECTED NOT DETECTED   Listeria monocytogenes NOT DETECTED NOT DETECTED  Staphylococcus species DETECTED (A) NOT DETECTED    Comment: CRITICAL RESULT CALLED TO, READ BACK BY AND VERIFIED WITH: PHARMD M.MICHELLE AT 0743N 04/15/2024 BY T.SAAD.    Staphylococcus aureus (BCID) DETECTED (A) NOT DETECTED    Comment: CRITICAL RESULT CALLED TO, READ BACK BY AND VERIFIED WITH: PHARMD M.MICHELLE AT 0743N 04/15/2024 BY T.SAAD.    Staphylococcus epidermidis NOT DETECTED NOT DETECTED   Staphylococcus lugdunensis NOT DETECTED NOT DETECTED   Streptococcus species NOT DETECTED NOT DETECTED   Streptococcus agalactiae NOT DETECTED NOT DETECTED   Streptococcus pneumoniae NOT DETECTED NOT DETECTED   Streptococcus pyogenes NOT DETECTED NOT DETECTED   A.calcoaceticus-baumannii NOT  DETECTED NOT DETECTED   Bacteroides fragilis NOT DETECTED NOT DETECTED   Enterobacterales NOT DETECTED NOT DETECTED   Enterobacter cloacae complex NOT DETECTED NOT DETECTED   Escherichia coli NOT DETECTED NOT DETECTED   Klebsiella aerogenes NOT DETECTED NOT DETECTED   Klebsiella oxytoca NOT DETECTED NOT DETECTED   Klebsiella pneumoniae NOT DETECTED NOT DETECTED   Proteus species NOT DETECTED NOT DETECTED   Salmonella species NOT DETECTED NOT DETECTED   Serratia marcescens NOT DETECTED NOT DETECTED   Haemophilus influenzae NOT DETECTED NOT DETECTED   Neisseria meningitidis NOT DETECTED NOT DETECTED   Pseudomonas aeruginosa NOT DETECTED NOT DETECTED   Stenotrophomonas maltophilia NOT DETECTED NOT DETECTED   Candida albicans NOT DETECTED NOT DETECTED   Candida auris NOT DETECTED NOT DETECTED   Candida glabrata NOT DETECTED NOT DETECTED   Candida krusei NOT DETECTED NOT DETECTED   Candida parapsilosis NOT DETECTED NOT DETECTED   Candida tropicalis NOT DETECTED NOT DETECTED   Cryptococcus neoformans/gattii NOT DETECTED NOT DETECTED   Meth resistant mecA/C and MREJ NOT DETECTED NOT DETECTED    Comment: Performed at [facility], [address] [place], [place] [address]  Resp panel by RT-PCR (RSV, Flu A&B, Covid) Anterior Nasal Swab     Status: None   Collection Time: 04/14/24  4:07 PM   Specimen: Anterior Nasal Swab  Result Value Ref Range   SARS Coronavirus 2 by RT PCR NEGATIVE NEGATIVE    Comment: (NOTE) SARS-CoV-2 target nucleic acids are NOT DETECTED.  The SARS-CoV-2 RNA is generally detectable in upper respiratory specimens during the acute phase of infection. The lowest concentration of SARS-CoV-2 viral copies this assay can detect is 138 copies/mL. A negative result does not preclude SARS-Cov-2 infection and should not be used as the sole basis for treatment or other patient management decisions. A negative result may occur with  improper specimen collection/handling,  submission of specimen other than nasopharyngeal swab, presence of viral mutation(s) within the areas targeted by this assay, and inadequate number of viral copies(<138 copies/mL). A negative result must be combined with clinical observations, patient history, and epidemiological information. The expected result is Negative.  Fact Sheet for Patients:  bloggercourse.com  Fact Sheet for Healthcare Providers:  seriousbroker.it  This test is no t yet approved or cleared by the [place]  FDA and  has been authorized for detection and/or diagnosis of SARS-CoV-2 by FDA under an Emergency Use Authorization (EUA). This EUA will remain  in effect (meaning this test can be used) for the duration of the COVID-19 declaration under Section 564(b)(1) of the Act, 21 U.S.C.section 360bbb-3(b)(1), unless the authorization is terminated  or revoked sooner.       Influenza A by PCR NEGATIVE NEGATIVE   Influenza B by PCR NEGATIVE NEGATIVE    Comment: (NOTE) The Xpert Xpress SARS-CoV-2/FLU/RSV plus assay is intended  as an aid in the diagnosis of influenza from Nasopharyngeal swab specimens and should not be used as a sole basis for treatment. Nasal washings and aspirates are unacceptable for Xpert Xpress SARS-CoV-2/FLU/RSV testing.  Fact Sheet for Patients: bloggercourse.com  Fact Sheet for Healthcare Providers: seriousbroker.it  This test is not yet approved or cleared by the [place]  FDA and has been authorized for detection and/or diagnosis of SARS-CoV-2 by FDA under an Emergency Use Authorization (EUA). This EUA will remain in effect (meaning this test can be used) for the duration of the COVID-19 declaration under Section 564(b)(1) of the Act, 21 U.S.C. section 360bbb-3(b)(1), unless the authorization is terminated or revoked.     Resp Syncytial Virus by PCR NEGATIVE NEGATIVE     Comment: (NOTE) Fact Sheet for Patients: bloggercourse.com  Fact Sheet for Healthcare Providers: seriousbroker.it  This test is not yet approved or cleared by the [place]  FDA and has been authorized for detection and/or diagnosis of SARS-CoV-2 by FDA under an Emergency Use Authorization (EUA). This EUA will remain in effect (meaning this test can be used) for the duration of the COVID-19 declaration under Section 564(b)(1) of the Act, 21 U.S.C. section 360bbb-3(b)(1), unless the authorization is terminated or revoked.  Performed at [facility], [address] [place], [place] [address]   Urinalysis, w/ Reflex to Culture (Infection Suspected) -Urine, Catheterized     Status: Abnormal   Collection Time: 04/14/24  4:07 PM  Result Value Ref Range   Specimen Source URINE, CATHETERIZED    Color, Urine AMBER (A) YELLOW    Comment: BIOCHEMICALS MAY BE AFFECTED BY COLOR   APPearance HAZY (A) CLEAR   Specific Gravity, Urine 1.014 1.005 - 1.030   pH 5.0 5.0 - 8.0   Glucose, UA NEGATIVE NEGATIVE mg/dL   Hgb urine dipstick LARGE (A) NEGATIVE   Bilirubin Urine NEGATIVE NEGATIVE   Ketones, ur NEGATIVE NEGATIVE mg/dL   Protein, ur 30 (A) NEGATIVE mg/dL   Nitrite NEGATIVE NEGATIVE   Leukocytes,Ua SMALL (A) NEGATIVE   RBC / HPF 0-5 0 - 5 RBC/hpf   WBC, UA 11-20 0 - 5 WBC/hpf    Comment:        Reflex urine culture not performed if WBC <=10, OR if Squamous epithelial cells >5. If Squamous epithelial cells >5 suggest recollection.    Bacteria, UA NONE SEEN NONE SEEN   Squamous Epithelial / HPF 0-5 0 - 5 /HPF   Mucus PRESENT     Comment: Performed at [facility], [address], [place], [place] [address]  CBG monitoring, ED     Status: Abnormal   Collection Time: 04/14/24  5:32 PM  Result Value Ref Range   Glucose-Capillary 121 (H) 70 - 99 mg/dL    Comment: Glucose reference range applies only to samples taken after fasting for at  least 8 hours.  Blood gas, arterial     Status: Abnormal   Collection Time: 04/14/24  6:29 PM  Result Value Ref Range   pH, Arterial 7.4 7.35 - 7.45   pCO2 arterial 35 32 - 48 mmHg   pO2, Arterial 184 (H) 83 - 108 mmHg   Bicarbonate 21.7 20.0 - 28.0 mmol/L   Acid-base deficit 2.5 (H) 0.0 - 2.0 mmol/L   O2 Saturation 100 %   Patient temperature 37.1    Collection site R BRACH    Drawn by [id number]    Allens test (pass/fail) PASS PASS    Comment: Performed at [facility], [address] [place], [place] [address]  Lactic acid, plasma     Status: Abnormal   Collection Time: 04/14/24  6:31 PM  Result Value Ref Range   Lactic Acid, Venous 4.4 (HH) 0.5 - 1.9 mmol/L    Comment: Critical Value, Read Back and verified with SIDNEY,RN ON 04/14/24 AT 1914 BY PURDIE,J Performed at [facility], [address] [place], [place] [address]   Blood Culture (routine x 2)     Status: None (Preliminary result)   Collection Time: 04/14/24  6:31 PM   Specimen: BLOOD RIGHT WRIST  Result Value Ref Range   Specimen Description BLOOD RIGHT WRIST BLOOD    Special Requests AEROBIC BOTTLE ONLY Blood Culture adequate volume    Culture      NO GROWTH < 12 HOURS Performed at [facility], [address], [place], [place] [address]    Report Status PENDING   Troponin T, High Sensitivity     Status: Abnormal   Collection Time: 04/14/24  6:31 PM  Result Value Ref Range   Troponin T High Sensitivity 49 (H) 0 - 19 ng/L    Comment: (NOTE) Biotin concentrations > 1000 ng/mL falsely decrease TnT results.  Serial cardiac troponin measurements are suggested.  Refer to the Links section for chest pain algorithms and additional  guidance. Performed at [facility], [address], [place], [place] [address]   Culture, Respiratory w Gram Stain     Status: None (Preliminary result)   Collection Time: 04/14/24  6:51 PM   Specimen: Tracheal Aspirate; Respiratory  Result Value Ref Range   Specimen Description TRACHEAL  ASPIRATE    Special Requests NONE    Gram Stain      RARE WBC PRESENT, PREDOMINANTLY PMN FEW GRAM POSITIVE COCCI FEW GRAM NEGATIVE RODS    Culture      TOO YOUNG TO READ Performed at [facility], [address], [place], [place] [address]    Report Status PENDING   MRSA Next Gen by PCR, Nasal     Status: None   Collection Time: 04/14/24  8:39 PM   Specimen: Nasal Mucosa; Nasal Swab  Result Value Ref Range   MRSA by PCR Next Gen NOT DETECTED NOT DETECTED    Comment: (NOTE) The GeneXpert MRSA Assay (FDA approved for NASAL specimens only), is one component of a comprehensive MRSA colonization surveillance program. It is not intended to diagnose MRSA infection nor to guide or monitor treatment for MRSA infections. Test performance is not FDA approved in patients less than 75 years old. Performed at [facility], [address], [place], [place] [address]   Glucose, capillary     Status: Abnormal   Collection Time: 04/14/24  8:42 PM  Result Value Ref Range   Glucose-Capillary 108 (H) 70 - 99 mg/dL    Comment: Glucose reference range applies only to samples taken after fasting for at least 8 hours.  Glucose, capillary     Status: Abnormal   Collection Time: 04/14/24 11:12 PM  Result Value Ref Range   Glucose-Capillary 123 (H) 70 - 99 mg/dL    Comment: Glucose reference range applies only to samples taken after fasting for at least 8 hours.  Rapid urine drug screen (hospital performed)     Status: Abnormal   Collection Time: 04/15/24 12:09 AM  Result Value Ref Range   Opiates NONE DETECTED NONE DETECTED   Cocaine POSITIVE (A) NONE DETECTED   Benzodiazepines POSITIVE (A) NONE DETECTED   Amphetamines  NONE DETECTED NONE DETECTED   Tetrahydrocannabinol NONE DETECTED NONE DETECTED   Barbiturates NONE DETECTED NONE DETECTED    Comment: (NOTE) DRUG SCREEN FOR MEDICAL PURPOSES ONLY.  IF CONFIRMATION IS NEEDED FOR ANY PURPOSE, NOTIFY LAB WITHIN 5 DAYS.  LOWEST  DETECTABLE LIMITS FOR URINE DRUG SCREEN Drug Class                     Cutoff (ng/mL) Amphetamine and metabolites    1000 Barbiturate and metabolites    200 Benzodiazepine                 200 Opiates and metabolites        300 Cocaine and metabolites        300 THC                            50 Performed at [facility], [address] [place], [place] [address]   Lactic acid, plasma     Status: Abnormal   Collection Time: 04/15/24 12:44 AM  Result Value Ref Range   Lactic Acid, Venous 2.3 (HH) 0.5 - 1.9 mmol/L    Comment: CRITICAL RESULT CALLED TO, READ BACK BY AND VERIFIED WITH S.SAVAGE RN 0111 04/15/2024 BY G.GANADEN Performed at [facility], [address], [place], [place] [address]   CBC     Status: Abnormal   Collection Time: 04/15/24 12:50 AM  Result Value Ref Range   WBC 25.5 (H) 4.0 - 10.5 K/uL   RBC 2.25 (L) 4.22 - 5.81 MIL/uL   Hemoglobin 6.0 (LL) 13.0 - 17.0 g/dL    Comment: REPEATED TO VERIFY This critical result has been called to HILARIO CLAP RN by Vernard Peabody on 04/15/2024 01:14:00, and has been read back.    HCT 20.1 (L) 39.0 - 52.0 %   MCV 89.3 80.0 - 100.0 fL   MCH 26.7 26.0 - 34.0 pg   MCHC 29.9 (L) 30.0 - 36.0 g/dL   RDW 81.6 (H) 88.4 - 84.4 %   Platelets 38 (L) 150 - 400 K/uL    Comment: SPECIMEN CHECKED FOR CLOTS REPEATED TO VERIFY PLATELET COUNT CONFIRMED BY SMEAR Immature Platelet Fraction may be clinically indicated, consider ordering this additional test [id number]    nRBC 0.2 0.0 - 0.2 %    Comment: Performed at [facility], [address], [place], [place] [address]  Magnesium     Status: None   Collection Time: 04/15/24 12:50 AM  Result Value Ref Range   Magnesium 2.3 1.7 - 2.4 mg/dL    Comment: Performed at [facility], [address], [place], [place] [address]  Phosphorus     Status: Abnormal   Collection Time: 04/15/24 12:50 AM  Result Value Ref Range   Phosphorus 6.7 (H) 2.5 - 4.6 mg/dL    Comment: Performed  at [facility], [address], [place], [place] [address]  Comprehensive metabolic panel     Status: Abnormal   Collection Time: 04/15/24 12:50 AM  Result Value Ref Range   Sodium 137 135 - 145 mmol/L   Potassium 5.3 (H) 3.5 - 5.1 mmol/L   Chloride 100 98 - 111 mmol/L   CO2 22 22 - 32 mmol/L   Glucose, Bld 150 (H) 70 - 99 mg/dL    Comment: Glucose reference range applies only to samples taken after fasting for at least 8 hours.  BUN 92 (H) 6 - 20 mg/dL   Creatinine, Ser 6.39 (H) 0.61 - 1.24 mg/dL   Calcium 7.7 (L) 8.9 - 10.3 mg/dL   Total Protein 6.0 (L) 6.5 - 8.1 g/dL   Albumin 1.6 (L) 3.5 - 5.0 g/dL   AST 23 15 - 41 U/L   ALT 8 0 - 44 U/L   Alkaline Phosphatase 247 (H) 38 - 126 U/L   Total Bilirubin 4.9 (H) 0.0 - 1.2 mg/dL   GFR, Estimated 20 (L) >60 mL/min    Comment: (NOTE) Calculated using the CKD-EPI Creatinine Equation (2021)    Anion gap 15 5 - 15    Comment: Performed at [facility], [address] [place], [place] [address]  Ethanol     Status: None   Collection Time: 04/15/24 12:50 AM  Result Value Ref Range   Alcohol, Ethyl (B) <15 <15 mg/dL    Comment: (NOTE) For medical purposes only. Performed at [facility], [address], [place], [place] [address]   Acetaminophen  level     Status: Abnormal   Collection Time: 04/15/24 12:50 AM  Result Value Ref Range   Acetaminophen  (Tylenol ), Serum <10 (L) 10 - 30 ug/mL    Comment: (NOTE) Therapeutic concentrations vary significantly. A range of 10-30 ug/mL  may be an effective concentration for many patients. However, some  are best treated at concentrations outside of this range. Acetaminophen  concentrations >150 ug/mL at 4 hours after ingestion  and >50 ug/mL at 12 hours after ingestion are often associated with  toxic reactions.  Performed at [facility], [address], [place], [place] [address]   Salicylate level     Status: Abnormal   Collection Time: 04/15/24 12:50 AM   Result Value Ref Range   Salicylate Lvl <7.0 (L) 7.0 - 30.0 mg/dL    Comment: Performed at [facility], [address], [place], [place] [address]  CK     Status: Abnormal   Collection Time: 04/15/24 12:50 AM  Result Value Ref Range   Total CK 14 (L) 49 - 397 U/L    Comment: Performed at [facility], [address] [place], [place] [address]  Amylase     Status: None   Collection Time: 04/15/24 12:50 AM  Result Value Ref Range   Amylase 43 28 - 100 U/L    Comment: Performed at [facility], [address], [place], [place] [address]  Lipase, blood     Status: None   Collection Time: 04/15/24 12:50 AM  Result Value Ref Range   Lipase 22 11 - 51 U/L    Comment: Performed at [facility], [address], [place], [place] [address]  Type and screen [facility]     Status: None (Preliminary result)   Collection Time: 04/15/24  1:38 AM  Result Value Ref Range   ABO/RH(D) A POS    Antibody Screen NEG    Sample Expiration 04/18/2024,2359    Unit Number [id number]    Blood Component Type RED CELLS,LR    Unit division 00    Status of Unit ISSUED    Transfusion Status OK TO TRANSFUSE    Crossmatch Result Compatible    Unit Number [id number]    Blood Component Type RED CELLS,LR    Unit division 00    Status of Unit ISSUED    Transfusion Status OK TO TRANSFUSE    Crossmatch Result  Compatible Performed at [facility], [address] [place], [place] [address]   Prepare RBC (crossmatch)     Status: None   Collection Time: 04/15/24  1:38 AM  Result Value Ref Range   Order Confirmation      ORDER PROCESSED BY BLOOD BANK Performed at [facility], [address] [place], [place] [address]   DIC Panel ONCE - STAT     Status: Abnormal   Collection Time: 04/15/24  2:17 AM  Result Value Ref Range   Prothrombin Time 18.6 (H) 11.4 - 15.2 seconds   INR 1.5 (H) 0.8 - 1.2    Comment: (NOTE) INR goal varies based on  device and disease states.    aPTT 33 24 - 36 seconds   Fibrinogen 140 (L) 210 - 475 mg/dL    Comment: (NOTE) Fibrinogen results may be underestimated in patients receiving thrombolytic therapy.    D-Dimer, Quant >20.00 (H) 0.00 - 0.50 ug/mL-FEU    Comment: (NOTE) At the manufacturer cut-off value of 0.5 g/mL FEU, this assay has a negative predictive value of 95-100%.This assay is intended for use in conjunction with a clinical pretest probability (PTP) assessment model to exclude pulmonary embolism (PE) and deep venous thrombosis (DVT) in outpatients suspected of PE or DVT. Results should be correlated with clinical presentation.    Platelets 14 (LL) 150 - 400 K/uL    Comment: REPEATED TO VERIFY PLATELET COUNT CONFIRMED BY SMEAR Immature Platelet Fraction may be clinically indicated, consider ordering this additional test [id number] This critical result has been called to [facility] RN by Vernard Peabody on 04/15/2024 02:48:41, and has been read back.    Smear Review NO SCHISTOCYTES SEEN     Comment: Performed at [facility], [address], [place], [place] [address]  Glucose, capillary     Status: Abnormal   Collection Time: 04/15/24  3:32 AM  Result Value Ref Range   Glucose-Capillary 62 (L) 70 - 99 mg/dL    Comment: Glucose reference range applies only to samples taken after fasting for at least 8 hours.  Glucose, capillary     Status: Abnormal   Collection Time: 04/15/24  3:34 AM  Result Value Ref Range   Glucose-Capillary 122 (H) 70 - 99 mg/dL    Comment: Glucose reference range applies only to samples taken after fasting for at least 8 hours.  Prepare platelet pheresis     Status: None (Preliminary result)   Collection Time: 04/15/24  4:41 AM  Result Value Ref Range   Unit Number [id number]    Blood Component Type PLTP1 PSORALEN TREATED    Unit division 00    Status of Unit ISSUED    Transfusion Status OK TO TRANSFUSE    Unit Number [id number]    Blood  Component Type PLTP1 PSORALEN TREATED    Unit division 00    Status of Unit REL FROM [facility]    Transfusion Status OK TO TRANSFUSE    Unit Number [id number]    Blood Component Type PLTP2 PSORALEN TREATED    Unit division 00    Status of Unit ISSUED    Transfusion Status      OK TO TRANSFUSE Performed at [facility], [address], [place], [place] [address]   Prepare cryoprecipitate     Status: None (Preliminary result)   Collection Time: 04/15/24  4:41 AM  Result Value Ref Range   Unit Number [id number]    Blood Component  Type POOL FIBR CMPLX 2D THW    Unit division 00    Status of Unit ISSUED    Transfusion Status      OK TO TRANSFUSE Performed at [facility], [address], [place], [place] [address]   Blood gas, arterial     Status: Abnormal   Collection Time: 04/15/24  4:54 AM  Result Value Ref Range   pH, Arterial 7.34 (L) 7.35 - 7.45   pCO2 arterial 43 32 - 48 mmHg   pO2, Arterial 162 (H) 83 - 108 mmHg   Bicarbonate 23.2 20.0 - 28.0 mmol/L   Acid-base deficit 2.8 (H) 0.0 - 2.0 mmol/L   O2 Saturation 99.4 %   Patient temperature 36.6    Collection site RIGHT BRACHIAL    Drawn by GLADE HADDOCK, RRT    Allens test (pass/fail) PASS PASS    Comment: Performed at [facility], [address] [place], [place] [address]  Glucose, capillary     Status: Abnormal   Collection Time: 04/15/24  7:30 AM  Result Value Ref Range   Glucose-Capillary 106 (H) 70 - 99 mg/dL    Comment: Glucose reference range applies only to samples taken after fasting for at least 8 hours.  Vancomycin , random     Status: None   Collection Time: 04/15/24  7:40 AM  Result Value Ref Range   Vancomycin  Rm 12 ug/mL    Comment:        Random Vancomycin  therapeutic range is dependent on dosage and time of specimen collection. A peak range is 20-40 ug/mL A trough range is 5-15 ug/mL        Performed at [facility], [address] [place], [place] [address]   DIC  Panel Once     Status: Abnormal   Collection Time: 04/15/24  7:40 AM  Result Value Ref Range   Prothrombin Time 18.4 (H) 11.4 - 15.2 seconds   INR 1.4 (H) 0.8 - 1.2    Comment: (NOTE) INR goal varies based on device and disease states.    aPTT 36 24 - 36 seconds   Fibrinogen 138 (L) 210 - 475 mg/dL    Comment: (NOTE) Fibrinogen results may be underestimated in patients receiving thrombolytic therapy.    D-Dimer, Quant >20.00 (H) 0.00 - 0.50 ug/mL-FEU    Comment: (NOTE) At the manufacturer cut-off value of 0.5 g/mL FEU, this assay has a negative predictive value of 95-100%.This assay is intended for use in conjunction with a clinical pretest probability (PTP) assessment model to exclude pulmonary embolism (PE) and deep venous thrombosis (DVT) in outpatients suspected of PE or DVT. Results should be correlated with clinical presentation.    Platelets 39 (L) 150 - 400 K/uL    Comment: SPECIMEN CHECKED FOR CLOTS POST TRANSFUSION SPECIMEN REPEATED TO VERIFY Immature Platelet Fraction may be clinically indicated, consider ordering this additional test [id number]    Smear Review NO SCHISTOCYTES SEEN     Comment: Performed at [facility], [address], [place], [place] [address]  CBC     Status: Abnormal   Collection Time: 04/15/24  7:40 AM  Result Value Ref Range   WBC 24.4 (H) 4.0 - 10.5 K/uL   RBC 2.70 (L) 4.22 - 5.81 MIL/uL   Hemoglobin 7.7 (L) 13.0 - 17.0 g/dL    Comment: POST TRANSFUSION SPECIMEN REPEATED TO VERIFY    HCT 24.4 (L) 39.0 - 52.0 %   MCV 90.4 80.0 -  100.0 fL    Comment: POST TRANSFUSION SPECIMEN   MCH 28.5 26.0 - 34.0 pg   MCHC 31.6 30.0 - 36.0 g/dL   RDW 83.5 (H) 88.4 - 84.4 %   Platelets 39 (L) 150 - 400 K/uL    Comment: POST TRANSFUSION SPECIMEN REPEATED TO VERIFY SPECIMEN CHECKED FOR CLOTS Immature Platelet Fraction may be clinically indicated, consider ordering this additional test [id number]    nRBC 0.2 0.0 - 0.2 %    Comment:  Performed at [facility], [address], [place], [place] [address]  Triglycerides     Status: Abnormal   Collection Time: 04/15/24  7:40 AM  Result Value Ref Range   Triglycerides 191 (H) <150 mg/dL    Comment: Performed at [facility], [address] [place], [place] [address]  Strep pneumoniae urinary antigen (not at [facility])     Status: None   Collection Time: 04/15/24  8:00 AM  Result Value Ref Range   Strep Pneumo Urinary Antigen NEGATIVE NEGATIVE    Comment:        Infection due to S. pneumoniae cannot be absolutely ruled out since the antigen present may be below the detection limit of the test. Performed at [facility], [address], [place], [place] [address]     MICRO:  IMAGING: DG CHEST PORT 1 VIEW Result Date: 04/15/2024 EXAM: 1 VIEW(S) XRAY OF THE CHEST 04/15/2024 04:41:43 AM COMPARISON: Radiograph of the chest dated 04/14/2024. CLINICAL HISTORY: Encounter for central line placement. FINDINGS: LINES, TUBES AND DEVICES: Endotracheal tube in place with tip 7.1 cm above the carina. Enteric tube in place with tip and side port overlying the expected region of the gastric lumen, with the side port beneath the gastroesophageal junction in the proximal stomach. Left internal jugular central venous catheter in place with tip overlying the expected region of the superior cavoatrial junction. LUNGS AND PLEURA: Bilateral patchy airspace opacities. Small left pleural effusion. No pulmonary edema. No pneumothorax. HEART AND MEDIASTINUM: No acute abnormality of the cardiac and mediastinal silhouettes. BONES AND SOFT TISSUES: No acute osseous abnormality. IMPRESSION: 1. Bilateral patchy airspace opacities and small left pleural effusion. 2. Left internal jugular central venous line and enteric catheter in appropriate position. No evidence of pneumothorax. Electronically signed by: Evalene Coho MD 04/15/2024 05:02 AM EST RP Workstation: [id number]   CT CHEST ABDOMEN  PELVIS WO CONTRAST Result Date: 04/15/2024 EXAM: CT CHEST, ABDOMEN AND PELVIS WITHOUT CONTRAST 04/14/2024 10:11:54 PM TECHNIQUE: CT of the chest, abdomen and pelvis was performed without the administration of intravenous contrast. Multiplanar reformatted images are provided for review. Automated exposure control, iterative reconstruction, and/or weight based adjustment of the mA/kV was utilized to reduce the radiation dose to as low as reasonably achievable. COMPARISON: CTA Chest 05/18/2013. CT of the abdomen and pelvis with contrast from 04/07/2015 and 02/02/2015. CLINICAL HISTORY: Sepsis. FINDINGS: CHEST: MEDIASTINUM AND LYMPH NODES: A small pericardial effusion again collects anteriorly, not seen in 2016 or earlier. The cardiac blood pool is less dense than the myocardium, consistent with anemia. The cardiac size is normal. There is enlargement of the pulmonary trunk measuring 3.4 cm indicating arterial hypertension, was previously 3.0 cm The pulmonary veins are nondilated. The unenhanced aorta and great vessels are unremarkable. Single mildly enlarged precarinal lymph node measuring 1.1 cm (series 3, axial 31); a subcarinal lymph node to the right is 1.2 cm. Right mid-hilar lymph node is 1.4 cm on image 36. No other adenopathy is seen without  contrast, including the axillary spaces. The central airways are clear. The trachea and main bronchi are patent. There is mucus and debris in the left lower lobe main bronchus and segmental bronchi as well. NGT does enter the stomach but the side hole is at the EG junction, and the tube needs to be advanced further 8 to 10 cm. ETT terminates 5.3 cm from the carina. Remote right hemithyroidectomy again noted with left lobe unremarkable. LUNGS AND PLEURA: Small layering left pleural effusion. No right-sided effusion. No pneumothorax. There is adjacent increased consolidation or atelectasis in the posterior basal left lower lobe. Multiple cavitary nodules were previously  noted in the right greater than left lung bases; these nodules are smaller than previously, consistent with partial resolution and healing septic emboli. There are scattered coarse atelectatic or scarring changes in both bases. There is patchy ground glass opacity in the right greater than left upper lobes with slight underlying interstitial thickening, findings most likely due to multifocal pneumonia. Above the plane of prior imaging, there are multiple additional cavitary and small coarsely nodular solid nodules in the upper lobes and superior segments of the lower lobes. Examples include a 1.1 cm right upper lobe cavitary nodule on series 5, axial 54; noncalcified 1.3 cm right upper lobe nodule anteriorly on image 77; 2.6 cm irregularly thick-walled cavitary nodule on image 86; left upper lobe solid 10 mm nodule on image 50; left upper lobe 1 cm nodule on image 67; left lower lobe 1.1 cm solid nodule on image 99; and a pleural-based right lower lobe 1.3 cm solid nodule on image 90. Others are smaller for the most part. ABDOMEN AND PELVIS: LIVER: The liver is 20 cm in length with mild steatosis. No focal abnormality is noted without contrast. GALLBLADDER AND BILE DUCTS: There is layering sludge or tiny stones in the gallbladder without biliary dilatation or wall thickening. SPLEEN: Splenomegaly similar to the last CT, maximum craniocaudal splenic measurement 14.2 cm. No focal abnormality is seen without contrast. PANCREAS: No acute abnormality. ADRENAL GLANDS: No contour deforming mass of the adrenal glands. KIDNEYS, URETERS AND BLADDER: No contour deforming mass of the kidneys. A small Bosniak 1 cyst in the superior pole of the right kidney is unchanged. There is increased perinephric fat stranding without periureteral stranding. This is probably on a congestive basis or fluid overload because there is also generalized mesenteric edema and body wall anasarca. No stones in the kidneys or ureters. No  hydronephrosis. No periureteral stranding. The bladder is catheterized, contracted, and not well seen. GI AND BOWEL: Stomach demonstrates no acute abnormality. There is no bowel obstruction or focal inflammatory process visible through the mesenteric edema. The appendix is normal. REPRODUCTIVE ORGANS: No acute abnormality. PERITONEUM AND RETROPERITONEUM: Mild ascites is seen in the paracolic gutters and pelvis. There is no free air, free hemorrhage, or abscess. VASCULATURE: Aorta is normal in caliber. There is patchy aortoiliac calcific plaque greater than expected for age. ABDOMINAL AND PELVIS LYMPH NODES: No lymphadenopathy. BONES AND SOFT TISSUES: At T5-T6, there is a partial disc collapse and erosive peridiscal endplate changes consistent with spondylodiscitis, with paraspinal soft tissue thickening compatible with paraspinal phlegmon. There is no recent imaging study or x-rays which included this area. The most recent was a PA and lateral chest from 10/2016, and this was not present at that time. No other significant skeletal findings in the thorax. . L5 is transitional with right hemisacralization. Since 10/02, there are progressive peridiscal endplate destructive changes and irregularity at L2-L3 and  to the right of center at L4-L5. Findings consistent with progressive spondylodiscitis. Generalized body wall edema is also increased. No paraspinal abscess is seen. IMPRESSION: 1. Findings consistent with multifocal pneumonia with superimposed multiple cavitary and solid pulmonary nodules, some decreasing in size consistent with evolving septic emboli. Recommended clinical and microbiologic management and follow-up imaging as clinically indicated. 2. 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 No paraspinal abscess is seen. 3. Progressive spondylodiscitis at L2-L3 and L4-L5 since october 2 . No paraspinal abscess is seen. 4. Small pericardial effusion. 5. Anemia. 6. Enlarged pulmonary trunk measuring 3.4  cm. 7. Small left pleural effusion with adjacent left lower lobe atelectasis or consolidation. 8. Generalized edema/anasarca with mild ascites. 9. Stable splenomegaly. 10. These results will be telephoned to the referring provider or the referring providers representative by professional radiology assistant [facility]) personnel, with communication documented in the [facility] dashboard. Electronically signed by: Francis Quam MD 04/15/2024 12:30 AM EST RP Workstation: [id number]   CT HEAD WO CONTRAST ( ) Result Date: 04/14/2024 EXAM: CT HEAD WITHOUT CONTRAST 04/14/2024 10:11:54 PM TECHNIQUE: CT of the head was performed without the administration of intravenous contrast. Automated exposure control, iterative reconstruction, and/or weight based adjustment of the mA/kV was utilized to reduce the radiation dose to as low as reasonably achievable. COMPARISON: None available. CLINICAL HISTORY: Mental status change, unknown cause. FINDINGS: BRAIN AND VENTRICLES: No acute hemorrhage. No evidence of acute infarct. No hydrocephalus. No extra-axial collection. No mass effect or midline shift. ORBITS: No acute abnormality. SINUSES: No acute abnormality. Enteric tube partially visualized coiled within the oropharynx/nasopharynx . SOFT TISSUES AND SKULL: No acute soft tissue abnormality. No skull fracture. IMPRESSION: 1. No acute intracranial abnormality. 2. Chronic left maxillary sinusitis. Electronically signed by: Morene Hoard MD 04/14/2024 10:37 PM EST RP Workstation: [id number]   DG Chest Port 1 View Result Date: 04/14/2024 EXAM: 1 VIEW XRAY OF THE CHEST 04/14/2024 08:56:35 PM COMPARISON: 04/14/2024 CLINICAL HISTORY: HISTORY OF ett FINDINGS: LINES, TUBES AND DEVICES: Endotracheal tube retracted, now 8 cm above the carina. NG tube tip at the GE junction. LUNGS AND PLEURA: Small left pleural effusion. Patchy bilateral airspace disease, right greater than left, worsening since prior study. No pulmonary edema. No  pneumothorax. HEART AND MEDIASTINUM: No acute abnormality of the cardiac and mediastinal silhouettes. BONES AND SOFT TISSUES: No acute osseous abnormality. IMPRESSION: 1. Worsening patchy bilateral airspace disease, right greater than left. 2. Small left pleural effusion. 3. Retraction of the endotracheal tube, now 8 cm above the carina. NG tube tip at the ge junction. Recommend advancing several cm into the stomach. Electronically signed by: Franky Crease MD 04/14/2024 09:03 PM EST RP Workstation: [id number]   DG Chest Port 1 View Result Date: 04/14/2024 EXAM: 1 VIEW(S) XRAY OF THE CHEST 04/14/2024 05:37:09 PM COMPARISON: 03/14/2024 CLINICAL HISTORY: Questionable sepsis - evaluate for abnormality. FINDINGS: LINES, TUBES AND DEVICES: Endotracheal and nasogastric tubes are in grossly good position. LUNGS AND PLEURA: Patchy opacities are seen in the right lower lobe concerning for possible pneumonia or scarring. Minimal left basilar septum atelectasis or scarring is noted as well. No pulmonary edema. No pleural effusion. No pneumothorax. HEART AND MEDIASTINUM: No acute abnormality of the cardiac and mediastinal silhouettes. BONES AND SOFT TISSUES: No acute osseous abnormality. IMPRESSION: 1. Patchy right lower lobe opacities, suspicious for pneumonia versus scarring. 2. Minimal left basilar atelectasis or scarring. Electronically signed by: Lynwood Seip MD 04/14/2024 05:48 PM EST RP Workstation: [id number]    HISTORICAL MICRO/IMAGING  Assessment/Plan:  48yo M with disseminated MSSA  infection initially diagnosed in October. 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 Will avoid nephrotoxic meds/ renally dose abtx  Lactic acidosis= resolving  evaluation of this patient requires complex antimicrobial therapy evaluation and counseling and isolation needs for disease transmission risk assessment and mitigation.   I personally spent a total of 82 minutes in the care of the patient today including preparing to see the patient, getting/reviewing separately obtained history, performing a medically appropriate exam/evaluation, placing orders, documenting clinical information in the EHR, independently interpreting results, and communicating results.

## 2024-04-15 NOTE — Progress Notes (Signed)
 PCCM progress note  Echocardiogram obtained and revealed a depressed EF of 45% with global hypokinesis and grade 1 diastolic dysfunction,  RVSP estimated at 76 mmHg, and 2 mobile masses seen on the tricuspid valve with 1 measuring 2.1 cm x 1.1 indicating likely endocarditis.   Cardiology consulted and recommendations were made to consult cardiothoracic surgery to determine if patient is stable/appropriate for surgical intervention.  This consult was placed.    If cardiology is needed for additional procedures such as TEE please consult cardiology again.  Bosco Paparella D. Harris, NP-C [facility] Personal contact information can be found on Amion  If no contact or response made please call [phone] 04/15/2024, 3:29 PM

## 2024-04-15 NOTE — Progress Notes (Signed)
 eLink Physician-Brief Progress Note Patient Name: [name] [name] DOB: [birth year] MRN: [id number]   Date of Service  04/15/2024  HPI/Events of Note  Hgb 6.0  eICU Interventions  Transfusion ordered     Intervention Category Intermediate Interventions: Bleeding - evaluation and treatment with blood products  CLAUDENE AGENT, P 04/15/2024, 1:21 AM

## 2024-04-15 NOTE — Progress Notes (Addendum)
 PHARMACY - PHYSICIAN COMMUNICATION CRITICAL VALUE ALERT - BLOOD CULTURE IDENTIFICATION (BCID)  [name] [name] is an 48 y.o. male who presented to [facility] on 04/14/2024 with a chief complaint of AMS. Previously admitted 03/14/24-03/25/24 with MSSA bacteremia, PNA, psoas abscess, and lumbar osteomyelitis but left AMA.  Assessment:  Blood cultures (2 out of 3 bottles) with GPC. BCID confirms still MSSA. Penicillin allergy -unknown.   Name of physician (or Provider) Contacted: Benton Lesches, NP  Current antibiotics: Vancomycin  and Merrem  Changes to prescribed antibiotics recommended:  Recommendations accepted by provider - Change to Ancef  2g IV every 12 hours  ID to see today Monitor renal function to adjust dosing  Results for orders placed or performed during the hospital encounter of 04/14/24  Blood Culture ID Panel (Reflexed) (Collected: 04/14/2024  3:31 PM)  Result Value Ref Range   Enterococcus faecalis NOT DETECTED NOT DETECTED   Enterococcus Faecium NOT DETECTED NOT DETECTED   Listeria monocytogenes NOT DETECTED NOT DETECTED   Staphylococcus species DETECTED (A) NOT DETECTED   Staphylococcus aureus (BCID) DETECTED (A) NOT DETECTED   Staphylococcus epidermidis NOT DETECTED NOT DETECTED   Staphylococcus lugdunensis NOT DETECTED NOT DETECTED   Streptococcus species NOT DETECTED NOT DETECTED   Streptococcus agalactiae NOT DETECTED NOT DETECTED   Streptococcus pneumoniae NOT DETECTED NOT DETECTED   Streptococcus pyogenes NOT DETECTED NOT DETECTED   A.calcoaceticus-baumannii NOT DETECTED NOT DETECTED   Bacteroides fragilis NOT DETECTED NOT DETECTED   Enterobacterales NOT DETECTED NOT DETECTED   Enterobacter cloacae complex NOT DETECTED NOT DETECTED   Escherichia coli NOT DETECTED NOT DETECTED   Klebsiella aerogenes NOT DETECTED NOT DETECTED   Klebsiella oxytoca NOT DETECTED NOT DETECTED   Klebsiella pneumoniae NOT DETECTED NOT DETECTED   Proteus species NOT DETECTED NOT  DETECTED   Salmonella species NOT DETECTED NOT DETECTED   Serratia marcescens NOT DETECTED NOT DETECTED   Haemophilus influenzae NOT DETECTED NOT DETECTED   Neisseria meningitidis NOT DETECTED NOT DETECTED   Pseudomonas aeruginosa NOT DETECTED NOT DETECTED   Stenotrophomonas maltophilia NOT DETECTED NOT DETECTED   Candida albicans NOT DETECTED NOT DETECTED   Candida auris NOT DETECTED NOT DETECTED   Candida glabrata NOT DETECTED NOT DETECTED   Candida krusei NOT DETECTED NOT DETECTED   Candida parapsilosis NOT DETECTED NOT DETECTED   Candida tropicalis NOT DETECTED NOT DETECTED   Cryptococcus neoformans/gattii NOT DETECTED NOT DETECTED   Meth resistant mecA/C and MREJ NOT DETECTED NOT DETECTED    Harlene Boga, PharmD, BCPS, BCCCP Clinical Pharmacist Please refer to [facility] for [facility] Pharmacy numbers 04/15/2024  7:49 AM

## 2024-04-15 NOTE — TOC CM/SW Note (Addendum)
 Transition of Care [facility]) - Inpatient Brief Assessment   Patient Details  Name: [name] [name] MRN: [id number] Date of Birth: [birth year]  Transition of Care [facility]) CM/SW Contact:    Tom-Johnson, Marge Vandermeulen Daphne, RN Phone Number: 04/15/2024, 4:16 PM   Clinical Narrative:  Patient presented to the [facility] ED with Altered Mental Status, covered in Feces. Patient was intubated for Airway protection. Patient was recently admitted with  MSSA Bacteremia, PNA, Psoas Abscesses and Lumbar Osteo from 03/14/24-03/25/24 and patient left AMA at that time.   Patient was extubated this morning and placed on 4L O2. Patient reintubated today d/t increased Oxygen demand, Hypoxia and during PEA/Cardiac Arrest. Patient is currently intubated/sedated and tolerated well. On IV abx.  Patient not Medically ready for discharge.  CM will continue to follow as patient progresses with care towards discharge.            Transition of Care Asessment:

## 2024-04-15 NOTE — Procedures (Signed)
 Cardiopulmonary Resuscitation Note  [name] [name]  [id number]  [birth year]  Date:04/15/24  Time:1:37 PM   Provider Performing:Eunique Balik D. Harris   Procedure: Cardiopulmonary Resuscitation [phone])  Indication(s) Loss of Pulse  Consent N/A  Anesthesia N/A   Time Out N/A   Sterile Technique Hand hygiene, gloves   Procedure Description Patient became bradycardic with loss of pulses and eventual transition to PEA cardiac arrest during intubation. Patient received high quality chest compressions for 4 minutes with defibrillation or cardioversion when appropriate. Epinephrine  was administered every 3 minutes as directed by time biomedical engineer. Additional pharmacologic interventions included calcium chloride and sodium bicarbonate. Additional procedural interventions include central line.  Return of spontaneous circulation was achieved.  Family called and notified.   Complications/Tolerance N/A   EBL N/A   Specimen(s) N/A  Estimated time to ROSC: 4 minutes   Charlean Carneal D. Harris, NP-C [place] [facility] Personal contact information can be found on Amion  If no contact or response made please call [phone] 04/15/2024, 1:38 PM

## 2024-04-15 NOTE — Progress Notes (Signed)
 eLink Physician-Brief Progress Note Patient Name: LEMOINE GOYNE DOB: 07-28-1975 MRN: [id number]   Date of Service  04/15/2024  HPI/Events of Note  Patient intubated earlier with worsening assessor muscle use with brief PEA cardiac arrest.  Pending CT scans but still extremely restless despite therapy.  Concern for motion artifact during scan  eICU Interventions  One-time sedation with Versed  to optimize patient safety   0509 -hemoglobin 6.3, platelets 32.  No obvious bleeding.  Transfuse 1 unit PRBC  Intervention Category Minor Interventions: Agitation / anxiety - evaluation and management  Dominik Lauricella 04/15/2024, 11:31 PM

## 2024-04-15 NOTE — Procedures (Signed)
 Extubation Procedure Note  Patient Details:   Name: GLORIA LAMBERTSON DOB: Oct 21, 1975 MRN: [id number]   Airway Documentation:    Vent end date: 04/15/24 Vent end time: 0912   Evaluation  O2 sats: stable throughout Complications: No apparent complications Patient did tolerate procedure well. Bilateral Breath Sounds: Diminished, Clear   Yes  Patient extubated per MD order. Positive cuff leak. No stridor noted. Patient with good cough. Vitals are stable on 4L [place]. RN at bedside.  Dayon Witt H Mlissa Tamayo 04/15/2024, 9:13 AM

## 2024-04-15 NOTE — Progress Notes (Signed)
 NAME:  [name] [name], MRN:  [id number], DOB:  January 17, 1976, LOS: 1 ADMISSION DATE:  04/14/2024, CONSULTATION DATE:  04/14/24 REFERRING MD:  S[facility]ette - [facility] EM, CHIEF COMPLAINT:   AMS  History of Present Illness:  48 yo M PMH recently admitted with MSSA PNA MSSA bacteremia psoas abscesses and lumbar osteo 03/14/24-03/25/24 (left AMA) who presented to [facility] ED 04/14/24 w AMS. Arrived to ED covered in feces,  altered, with poor airway protection and was intubated CXR with RLL opacities   Rcvd 2L LR bolus, started on vanc mero  Bcx sent   Initial labs VBG pH 7.37, pCO2 37, pO2 less than 31, HCO3 20.8 NA 140, K4.9, chloride 99, CO2 20, glucose 109, BUN 83, creatinine 4 on i-STAT> 3.4, anion gap 21, GFR 22  ALP 372, albumin 2.7, total bili 4.1  High-sensitivity troponin 55 > 49  Lactic acid 5.9 > 4.4  WBC 20, hemoglobin 7.7, hematocrit 25.6 Platelets 57 PT 17.9, INR 1.4  Ammonia 17   PCCM asked to admit to [place] campus in this setting   Pertinent  Medical History  MSSA bacteremia MSSA PNA Lumbar osteomyelitis  Psoas abscess  Severe protein calorie malnutrition FTT in adult  Significant Hospital Events: Including procedures, antibiotic start and stop dates in addition to other pertinent events   11/2 [facility] ED w AMS. Intubated. Started on vanc mero. Txf request to [place] to [facility]  11/3 issues with anemia and thrombocytopenia overnight resulting in transfusions however vasopressor requirement has not been titrated off  Interim History / Subjective:  Eyes open and tracking movement in room after stopping sedation  Objective    Blood pressure 107/81, pulse 99, temperature (!) 97.2 F (36.2 C), resp. rate (!) 23, height 6' (1.829 m), weight 64.4 kg, SpO2 99%.    Vent Mode: PSV;CPAP FiO2 (%):  [40 %-60 %] 40 % Set Rate:  [20 bmp] 20 bmp Vt Set:  [500 mL-620 mL] 500 mL PEEP:  [5 cmH20-8 cmH20] 5 cmH20 Pressure Support:  [5 cmH20] 5 cmH20 Plateau Pressure:  [15 cmH20-18 cmH20] 18 cmH20    Intake/Output Summary (Last 24 hours) at 04/15/2024 0810 Last data filed at 04/15/2024 0800 Gross per 24 hour  Intake 4005.98 ml  Output 230 ml  Net 3775.98 ml   Filed Weights   04/14/24 1455 04/14/24 2100 04/15/24 0336  Weight: 62.5 kg 64.4 kg 64.4 kg    Examination: General: Acutely ill-appearing thin adult male lying in bed on mechanical ventilation in no acute distress HEENT: ETT, MM pink/moist, PERRL,  Neuro: Eyes open and tracking movement in room, intermittently following commands with sedating stopped  CV: s1s2 regular rate and rhythm, no murmur, rubs, or gallops,  PULM: Clear to auscultation bilaterally, no increased work of breathing, no breath sounds, tolerating ventilator GI: soft, bowel sounds active in all 4 quadrants, non-tender, non-distended Extremities: warm/dry, no edema  Skin: no rashes or lesions  Resolved problem list   Assessment and Plan   Severe sepsis  Lactic acidosis  -recent MSSA bacteremia, PNA, lumbar oseto, psoas abscess. Left AMA 10/13, not sure that he was dc w abx successfully but 4wk ancef  was rec -Cultures this admission again appear consistent with MSSA bacteremia P: De-escalate antibiotics to Ancef  only Follow cultures Trend lactic acid Follow-up CT's Echocardiogram pending   Acute hypoxic respiratory failure Intubated for airway protection in setting of acute metabolic encephalopathy  -suspect in the setting of severe sepsis, but also has history of substance abuse P:  Tolerating SBT  well this a.m. can likely extubate soon Continue ventilator support with lung protective strategies  Wean PEEP and FiO2 for sats greater than 90%. Head of bed elevated 30 degrees. Plateau pressures less than 30 cm H20.  Follow intermittent chest x-ray and ABG.   SAT/SBT as tolerated, mentation preclude extubation  Ensure adequate pulmonary hygiene  Follow cultures  VAP bundle in place  PAD protocol Antibiotics as above  Acute Metabolic  Encephalopathy AMS  Hx of substance abuse  -Suspect in setting of severe sepsis but has hx of substance abuse, -Urine tox positive for benzo, opiates, and cocaine on 03/15/24  -Ammonia 17  P: Maintain neuro protective measures Nutrition and bowel regimen Delirium precautions Aspirations precautions  Cessation education when appropriate  Elevated troponin -suspect demand ischemia in setting of severe sepsis, EKG in ED showing sinus tach P: Ongoing reported care as above  AKI -worsening AGMA  -Creatinine 3.4 upon admission Hyperkalemia P: Follow renal function closely may need to consider nephrology consult Monitor urine output Trend Bmet Avoid nephrotoxins Ensure adequate renal perfusion   Hyperbilirubinemia  Elevated Alk phos  -Likely shock liver P: Follow CT chest abdomen and pelvis Trend LFTs Trend lipase and amylase  Anemia Thrombocytopenia- plt 57  - anemia stable from prior admission  Suspect thrombocytopenia in setting of severe sepsis P: Repeat DIC panel pending No schistocytes seen on smear Trend CBC Transfuse per protocol Hemoglobin goal greater than 7, platelet goal greater than 10  Hx of HTN P: Hold home antihypertensives Continuous telemetry  Hx of Anxiety  P:  Supportive care for now Hold home meds  Critical care time:   CRITICAL CARE Performed by: Tannisha Kennington D. Harris   Total critical care time: 42 minutes  Critical care time was exclusive of separately billable procedures and treating other patients.  Critical care was necessary to treat or prevent imminent or life-threatening deterioration.  Critical care was time spent personally by me on the following activities: development of treatment plan with patient and/or surrogate as well as nursing, discussions with consultants, evaluation of patient's response to treatment, examination of patient, obtaining history from patient or surrogate, ordering and performing treatments and  interventions, ordering and review of laboratory studies, ordering and review of radiographic studies, pulse oximetry and re-evaluation of patient's condition.  Gresia Isidoro D. Harris, NP-C [place] [facility] Personal contact information can be found on Amion  If no contact or response made please call [phone] 04/15/2024, 8:14 AM

## 2024-04-15 NOTE — Progress Notes (Signed)
 Initial Nutrition Assessment  DOCUMENTATION CODES:  Not applicable  INTERVENTION:  Initiate trickle tube feeds via OGT: Vital 1.5 at 16ml/hr  Goal regimen as medically feasible: Vital 1.5 at 45ml/hr ( per day) *Advance by 10ml q12h to goal rate  60ml ProSource TF20 once daily Provides 2060 kcal, 109g protein and free water daily  Once patient tolerating tube feed advancement, recommend addition of Juven BID to support wound healing.   Continue thiamine  and folvite , recommend addition of MVI   NUTRITION DIAGNOSIS:  Inadequate oral intake related to acute illness as evidenced by NPO status.  GOAL:  Patient will meet greater than or equal to 90% of their needs  MONITOR:  Vent status, Labs, Weight trends, TF tolerance, Skin  REASON FOR ASSESSMENT:  Ventilator    ASSESSMENT:  Pt admitted with metabolic encephalopathy and presumed septic shock d/t metastatic MSSA infection with concern for endocarditis. PMH significant for recent admission (10/2-10/13 leaving AMA) for MSSA, PNA, psoas abscesses and lumbar osteomyelitis, tobacco use, IVDU and alcohol use  11/2: admitted, [facility] from [facility] to [facility] 11/3: extubated; respiratory distress; re-intubated, cardiac arrest, ROSC  Patient is currently intubated on ventilator support MV: 10.3 L/min Temp (24hrs), Avg:98.6 F (37 C), Min:97.2 F (36.2 C), Max:99.7 F (37.6 C) Cuff MAP pressures- widely variable though >65  Pt seen by RD team during recent admission. Per nutrition focused physical exam at that time, pt observed to have severe muscle and fat deficits consistent with severe malnutrition. Suspect this is likely an ongoing diagnosis.   Pt intubated and unable to provide more recent nutrition related history since last admission.   Spoke with CCM NP regarding nutrition support. Amenable to initiation of trickle tube feeds today and monitor stability and ability to safely advance feeds in the next day or two.    Admit weight: 62.5 kg Current weight: 64.4 kg  Unfortunately there is limited documentation of weight history on file to review within the last year. Of note, pt's weight has trended down over the last 10 years from ~81 kg.   Drains/lines:  Left internal jugular OGT  Medications: colace BID, folic acid , miralax  daily Drips: Albumin LR @ 125ml/hr Levo @ 10 mcg/min thiamine   Labs:  Potassium 5.2 BUN 101 Cr 3.62 Phosphorus 6.7 Alkaline phosphorus 247 Albumin 1.6 GFR 20 CBG's <10-125 x12 hours  NUTRITION - FOCUSED PHYSICAL EXAM: Deferred to follow up.   Diet Order:   Diet Order             Diet NPO time specified  Diet effective now                   EDUCATION NEEDS:   No education needs have been identified at this time  Skin:  Skin Integrity Issues:: Stage II Stage II: Per WOC: 2 ruptured blisters 2 cm x 1 cm x 0.1 cm and 0.5 cm x 0.5 cm x 0.1 cm.  Last BM:  11/3 type 5 medium  Height:   Ht Readings from Last 1 Encounters:  04/14/24 6' (1.829 m)    Weight:   Wt Readings from Last 1 Encounters:  04/15/24 64.4 kg   BMI:  Body mass index is 19.26 kg/m.  Estimated Nutritional Needs:   Kcal:  2000-2200  Protein:  100-115g  Fluid:  >/=2L  Royce Maris, RDN, LDN Clinical Nutrition See AMiON for contact information.

## 2024-04-15 NOTE — Progress Notes (Signed)
 eLink Physician-Brief Progress Note Patient Name: [name] [name] DOB: Jul 31, 1975 MRN: [id number]   Date of Service  04/15/2024  HPI/Events of Note  CT chest shows cavitary lesions concerning for septic emboli  eICU Interventions  Echo to rule out endocarditis     Intervention Category Intermediate Interventions: Other:  CLAUDENE AGENT, P 04/15/2024, 6:12 AM

## 2024-04-15 NOTE — Progress Notes (Addendum)
 PCCM progress note  Notified of change in respiratory status including worsening assessor muscle use with increased oxygen demand and hypoxia.  Attempted medical management but respiratory distress persisted resulting in need for urgent reintubation.  During intubation patient experienced bradycardic that transition to PEA cardiac arrest.  See separate CODE BLUE note for details, estimated downtime 4 minutes.  Patient was successfully intubated during cardiac arrest.   Patient's spouse called and updated  [name] Turrubiates D. Harris, NP-C [facility] Personal contact information can be found on Amion  If no contact or response made please call [phone] 04/15/2024, 1:36 PM

## 2024-04-15 NOTE — Procedures (Signed)
 Central Venous Catheter Insertion Procedure Note  [name] [name]  [id number]  1975/08/08  Date:04/15/24  Time:4:32 AM   Provider Performing:Lloyde Ludlam Layman   Procedure: Insertion of Non-tunneled Central Venous Catheter(36556) with US  guidance (23062)   Indication(s) Medication administration  Consent Unable to obtain consent due to emergent nature of procedure.  Anesthesia See mar  Timeout Verified patient identification, verified procedure, site/side was marked, verified correct patient position, special equipment/implants available, medications/allergies/relevant history reviewed, required imaging and test results available.  Sterile Technique Maximal sterile technique including full sterile barrier drape, hand hygiene, sterile gown, sterile gloves, mask, hair covering, sterile ultrasound probe cover (if used).  Procedure Description Area of catheter insertion was cleaned with chlorhexidine and draped in sterile fashion.  With real-time ultrasound guidance a central venous catheter was placed into the left internal jugular vein. Nonpulsatile blood flow and easy flushing noted in all ports.  The catheter was sutured in place and sterile dressing applied.  Complications/Tolerance None; patient tolerated the procedure well. Chest X-ray is ordered to verify placement for internal jugular or subclavian cannulation.   Chest x-ray is not ordered for femoral cannulation.  EBL Minimal  Specimen(s) None

## 2024-04-16 DIAGNOSIS — R7881 Bacteremia: Secondary | ICD-10-CM

## 2024-04-16 DIAGNOSIS — J188 Other pneumonia, unspecified organism: Secondary | ICD-10-CM

## 2024-04-16 DIAGNOSIS — D539 Nutritional anemia, unspecified: Secondary | ICD-10-CM

## 2024-04-16 DIAGNOSIS — G9341 Metabolic encephalopathy: Secondary | ICD-10-CM

## 2024-04-16 DIAGNOSIS — J984 Other disorders of lung: Secondary | ICD-10-CM

## 2024-04-16 DIAGNOSIS — R6521 Severe sepsis with septic shock: Secondary | ICD-10-CM

## 2024-04-16 DIAGNOSIS — F17210 Nicotine dependence, cigarettes, uncomplicated: Secondary | ICD-10-CM

## 2024-04-16 DIAGNOSIS — M4626 Osteomyelitis of vertebra, lumbar region: Secondary | ICD-10-CM

## 2024-04-16 DIAGNOSIS — D696 Thrombocytopenia, unspecified: Secondary | ICD-10-CM

## 2024-04-16 DIAGNOSIS — E8770 Fluid overload, unspecified: Secondary | ICD-10-CM

## 2024-04-16 DIAGNOSIS — M4624 Osteomyelitis of vertebra, thoracic region: Secondary | ICD-10-CM

## 2024-04-16 DIAGNOSIS — M4625 Osteomyelitis of vertebra, thoracolumbar region: Secondary | ICD-10-CM

## 2024-04-16 DIAGNOSIS — A419 Sepsis, unspecified organism: Secondary | ICD-10-CM

## 2024-04-16 DIAGNOSIS — I330 Acute and subacute infective endocarditis: Secondary | ICD-10-CM

## 2024-04-16 DIAGNOSIS — M4644 Discitis, unspecified, thoracic region: Secondary | ICD-10-CM

## 2024-04-16 DIAGNOSIS — I2690 Septic pulmonary embolism without acute cor pulmonale: Secondary | ICD-10-CM

## 2024-04-16 DIAGNOSIS — K6812 Psoas muscle abscess: Secondary | ICD-10-CM

## 2024-04-16 DIAGNOSIS — R748 Abnormal levels of other serum enzymes: Secondary | ICD-10-CM

## 2024-04-16 DIAGNOSIS — J9601 Acute respiratory failure with hypoxia: Secondary | ICD-10-CM

## 2024-04-16 DIAGNOSIS — Z9911 Dependence on respirator [ventilator] status: Secondary | ICD-10-CM

## 2024-04-16 DIAGNOSIS — J9690 Respiratory failure, unspecified, unspecified whether with hypoxia or hypercapnia: Secondary | ICD-10-CM

## 2024-04-16 DIAGNOSIS — B9561 Methicillin susceptible Staphylococcus aureus infection as the cause of diseases classified elsewhere: Secondary | ICD-10-CM

## 2024-04-16 DIAGNOSIS — D6489 Other specified anemias: Secondary | ICD-10-CM

## 2024-04-16 DIAGNOSIS — I10 Essential (primary) hypertension: Secondary | ICD-10-CM

## 2024-04-16 LAB — BPAM PLATELET PHERESIS
Blood Product Expiration Date: 202511052359
Blood Product Expiration Date: 202511052359
ISSUE DATE / TIME: 202511030517
ISSUE DATE / TIME: 202511030535
ISSUE DATE / TIME: 202511042359
ISSUING PHYSICIAN: 202511030517
PRODUCT CODE: 202511030535
PRODUCT CODE: 202511052359
Unit Type and Rh: 202511042359
Unit Type and Rh: 6200
Unit Type and Rh: 6200
Unit Type and Rh: 6200
Unit Type and Rh: 7300

## 2024-04-16 LAB — PREPARE CRYOPRECIPITATE: Unit division: 0

## 2024-04-16 LAB — GLUCOSE, CAPILLARY
Glucose-Capillary: 115 mg/dL — ABNORMAL HIGH (ref 70–99)
Glucose-Capillary: 117 mg/dL — ABNORMAL HIGH (ref 70–99)
Glucose-Capillary: 121 mg/dL — ABNORMAL HIGH (ref 70–99)
Glucose-Capillary: 124 mg/dL — ABNORMAL HIGH (ref 70–99)
Glucose-Capillary: 135 mg/dL — ABNORMAL HIGH (ref 70–99)
Glucose-Capillary: 136 mg/dL — ABNORMAL HIGH (ref 70–99)
Glucose-Capillary: 140 mg/dL — ABNORMAL HIGH (ref 70–99)

## 2024-04-16 LAB — PREPARE PLATELET PHERESIS
Unit division: 0
Unit division: 0
Unit division: 0

## 2024-04-16 LAB — CBC
HCT: 19.7 % — ABNORMAL LOW (ref 39.0–52.0)
Hemoglobin: 6.3 g/dL — CL (ref 13.0–17.0)
MCH: 28.5 pg (ref 26.0–34.0)
MCHC: 32 g/dL (ref 30.0–36.0)
MCV: 89.1 fL (ref 80.0–100.0)
Platelets: 32 K/uL — ABNORMAL LOW (ref 150–400)
RBC: 2.21 MIL/uL — ABNORMAL LOW (ref 4.22–5.81)
RDW: 17 % — ABNORMAL HIGH (ref 11.5–15.5)
WBC: 15.3 K/uL — ABNORMAL HIGH (ref 4.0–10.5)
nRBC: 0.3 % — ABNORMAL HIGH (ref 0.0–0.2)

## 2024-04-16 LAB — BASIC METABOLIC PANEL WITH GFR
Anion gap: 13 (ref 5–15)
BUN: 111 mg/dL — ABNORMAL HIGH (ref 6–20)
CO2: 24 mmol/L (ref 22–32)
Calcium: 6.9 mg/dL — ABNORMAL LOW (ref 8.9–10.3)
Chloride: 105 mmol/L (ref 98–111)
Creatinine, Ser: 3.72 mg/dL — ABNORMAL HIGH (ref 0.61–1.24)
GFR, Estimated: 19 mL/min — ABNORMAL LOW (ref >60)
Glucose, Bld: 127 mg/dL — ABNORMAL HIGH (ref 70–99)
Potassium: 4.6 mmol/L (ref 3.5–5.1)
Sodium: 142 mmol/L (ref 135–145)

## 2024-04-16 LAB — HEMOGLOBIN AND HEMATOCRIT, BLOOD
HCT: 20.8 % — ABNORMAL LOW (ref 39.0–52.0)
HCT: 24.8 % — ABNORMAL LOW (ref 39.0–52.0)
HCT: 23.3 % — ABNORMAL LOW (ref 39.0–52.0)
Hemoglobin: 6.8 g/dL — CL (ref 13.0–17.0)
Hemoglobin: 8.2 g/dL — ABNORMAL LOW (ref 13.0–17.0)
Hemoglobin: 7.7 g/dL — ABNORMAL LOW (ref 13.0–17.0)

## 2024-04-16 LAB — PREPARE RBC (CROSSMATCH)

## 2024-04-16 LAB — DIC (DISSEMINATED INTRAVASCULAR COAGULATION)PANEL
D-Dimer, Quant: >20 ug{FEU}/mL — ABNORMAL HIGH (ref 0.00–0.50)
Fibrinogen: 119 mg/dL — ABNORMAL LOW (ref 210–475)
INR: 1.5 — ABNORMAL HIGH (ref 0.8–1.2)
Platelets: 30 K/uL — ABNORMAL LOW (ref 150–400)
Prothrombin Time: 19.1 s — ABNORMAL HIGH (ref 11.4–15.2)
Smear Review: NONE SEEN
aPTT: 35 s (ref 24–36)

## 2024-04-16 LAB — BPAM CRYOPRECIPITATE
Blood Product Expiration Date: 202511032359
ISSUE DATE / TIME: 202511030536
Unit Type and Rh: 5100

## 2024-04-16 LAB — URINE CULTURE: Culture: 60000 — AB

## 2024-04-16 LAB — MAGNESIUM: Magnesium: 2.4 mg/dL (ref 1.7–2.4)

## 2024-04-16 MED ORDER — SODIUM CHLORIDE 0.9% IV SOLUTION: Freq: Once | INTRAVENOUS | Status: AC

## 2024-04-16 MED ORDER — MIDAZOLAM HCL 2 MG/2ML IJ SOLN
INTRAMUSCULAR | Status: AC
  Filled 2024-04-16: qty 2

## 2024-04-16 MED ORDER — DEXMEDETOMIDINE HCL IN NACL 400 MCG/100ML IV SOLN
0.0000 ug/kg/h | INTRAVENOUS | Status: DC
  Administered 2024-04-16: 0.4 ug/kg/h via INTRAVENOUS
  Administered 2024-04-16: 0.8 ug/kg/h via INTRAVENOUS
  Administered 2024-04-17: 1.1 ug/kg/h via INTRAVENOUS
  Administered 2024-04-17: 0.9 ug/kg/h via INTRAVENOUS
  Administered 2024-04-17 (×2): 1.1 ug/kg/h via INTRAVENOUS
  Administered 2024-04-18: 0.7 ug/kg/h via INTRAVENOUS
  Administered 2024-04-18: 1 ug/kg/h via INTRAVENOUS
  Administered 2024-04-19: 0.8 ug/kg/h via INTRAVENOUS
  Administered 2024-04-19: 0.7 ug/kg/h via INTRAVENOUS
  Administered 2024-04-19: 0.8 ug/kg/h via INTRAVENOUS
  Administered 2024-04-20: 0.9 ug/kg/h via INTRAVENOUS
  Administered 2024-04-20: 0.8 ug/kg/h via INTRAVENOUS
  Administered 2024-04-21: 0.9 ug/kg/h via INTRAVENOUS
  Administered 2024-04-21 – 2024-04-22 (×3): 0.8 ug/kg/h via INTRAVENOUS
  Filled 2024-04-16 (×18): qty 100

## 2024-04-16 MED ORDER — FUROSEMIDE 10 MG/ML IJ SOLN
120.0000 mg | Freq: Once | INTRAVENOUS | Status: AC
  Administered 2024-04-16: 120 mg via INTRAVENOUS
  Filled 2024-04-16: qty 10

## 2024-04-16 MED ORDER — FUROSEMIDE 10 MG/ML IJ SOLN: 40.0000 mg | Freq: Once | INTRAMUSCULAR | Status: DC

## 2024-04-16 MED ORDER — MIDAZOLAM HCL (PF) 2 MG/2ML IJ SOLN
2.0000 mg | Freq: Once | INTRAMUSCULAR | Status: AC
  Administered 2024-04-16: 2 mg via INTRAVENOUS

## 2024-04-16 MED ORDER — VITAL 1.5 CAL PO LIQD
1000.0000 mL | ORAL | Status: DC
  Administered 2024-04-17 – 2024-04-20 (×5): 1000 mL
  Filled 2024-04-16 (×3): qty 1000

## 2024-04-16 MED ORDER — DEXMEDETOMIDINE HCL IN NACL 400 MCG/100ML IV SOLN
INTRAVENOUS | Status: AC
  Filled 2024-04-16: qty 100

## 2024-04-16 MED ORDER — IPRATROPIUM-ALBUTEROL 0.5-2.5 (3) MG/3ML IN SOLN
3.0000 mL | RESPIRATORY_TRACT | Status: DC | PRN
  Administered 2024-04-16 – 2024-05-03 (×7): 3 mL via RESPIRATORY_TRACT
  Filled 2024-04-16 (×5): qty 3

## 2024-04-16 NOTE — Progress Notes (Addendum)
 NAME:  [name] [name], MRN:  [id number], DOB:  1975/06/25, LOS: 2 ADMISSION DATE:  04/14/2024, CONSULTATION DATE:  04/14/24 REFERRING MD:  Suzette - [facility] EM, CHIEF COMPLAINT:   AMS  History of Present Illness:  48 yo M PMH recently admitted with MSSA PNA MSSA bacteremia psoas abscesses and lumbar osteo 03/14/24-03/25/24 (left AMA) who presented to [facility] ED 04/14/24 w AMS. Arrived to ED covered in feces,  altered, with poor airway protection and was intubated CXR with RLL opacities   Rcvd 2L LR bolus, started on vanc mero  Bcx sent   Initial labs VBG pH 7.37, pCO2 37, pO2 less than 31, HCO3 20.8 NA 140, K4.9, chloride 99, CO2 20, glucose 109, BUN 83, creatinine 4 on i-STAT> 3.4, anion gap 21, GFR 22  ALP 372, albumin 2.7, total bili 4.1  High-sensitivity troponin 55 > 49  Lactic acid 5.9 > 4.4  WBC 20, hemoglobin 7.7, hematocrit 25.6 Platelets 57 PT 17.9, INR 1.4  Ammonia 17   PCCM asked to admit to [place] campus in this setting   Pertinent  Medical History  MSSA bacteremia MSSA PNA Lumbar osteomyelitis  Psoas abscess  Severe protein calorie malnutrition FTT in adult  Significant Hospital Events: Including procedures, antibiotic start and stop dates in addition to other pertinent events   11/2 [facility] ED w AMS. Intubated. Started on vanc mero. Txf request to [place] to [facility]  11/3 extubated, developed respiratory distress requiring reintubation. Brief cardiac arrest during intubation.   Interim History / Subjective:  Awake and following commands this morning.  Complaining of some mild abdominal discomfort. CT overnight with no acute etiologies for abdominal pain.  Extubated yesterday and required reintubation.    Objective    Blood pressure 107/78, pulse 100, temperature 97.9 F (36.6 C), resp. rate 20, height 6' (1.829 m), weight 64.4 kg, SpO2 100%.    Vent Mode: PRVC FiO2 (%):  [40 %-50 %] 40 % Set Rate:  [24 bmp-26 bmp] 26 bmp Vt Set:  [500 mL] 500 mL PEEP:  [5 cmH20-8  cmH20] 5 cmH20 Plateau Pressure:  [14 cmH20-19 cmH20] 14 cmH20   Intake/Output Summary (Last 24 hours) at 04/16/2024 0801 Last data filed at 04/16/2024 9350 Gross per 24 hour  Intake 4359.27 ml  Output 560 ml  Net 3799.27 ml   Filed Weights   04/14/24 1455 04/14/24 2100 04/15/24 0336  Weight: 62.5 kg 64.4 kg 64.4 kg   Examination:  General: Thin middle aged male in NAD Neuro: Awake, alert, following commands CV:RRR, no MRG PULM: Clear bilateral breath sounds tolerating SBT GI: Soft, NT, ND Extremities: No significant edema.   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 Left AMA 10/13, not sure that he was dc w abx successfully but 4wk ancef  was rec -Blood cultures this admission again appear consistent with MSSA bacteremia -Urine culture 60k colonies staph.  P: Ancef  Follow cultures Trend lactic acid CVTS following Echocardiogram pending   Acute hypoxic respiratory failure Intubated for airway protection in setting of acute metabolic encephalopathy  -suspect in the setting of severe sepsis, but also has history of substance abuse -septic embolization to both lungs. P:  Failed extubation yesterday after looking so good on SBT I worry his valve may be the issue, so will await CVTS plan prior to retrying extubation. IN the mean time will try  to diurese. SBT as tolerated Follow cultures  VAP bundle in place  PAD protocol  Acute Metabolic Encephalopathy AMS  Hx of substance abuse  -Suspect in setting of severe sepsis but has hx of substance abuse, -Urine tox positive for benzo, opiates, and cocaine on 03/15/24  -Ammonia 17  P: Improved  AKI -worsening AGMA  -Creatinine 3.4 upon admission Hyperkalemia P: R Trend chemistries and urine output Considering diuresis  Hyperbilirubinemia  Elevated Alk  phos  -Likely shock liver P: Follow CT chest abdomen and pelvis Trend LFTs Trend lipase and amylase  Anemia Thrombocytopenia- plt 32  - anemia stable from prior admission  Suspect thrombocytopenia in setting of severe sepsis P: DIC panel not suggestive of overt DIC, repeat today No schistocytes seen on smear Trend CBC Transfusing one unit PRBC today for Hgb 6.3 Hemoglobin goal greater than 7, platelet goal greater than 10  Hx of HTN P: Hold home antihypertensives Continuous telemetry  Hx of Anxiety  P:  Supportive care for now Hold home meds  Critical care time:  48 minutes    Deward Eastern, AGACNP-BC [facility] [facility]  See Amion for personal pager PCCM on call pager [phone] until 7pm. Please call Elink 7p-7a. [phone]  04/16/2024 8:02 AM

## 2024-04-16 NOTE — Plan of Care (Signed)
  Problem: Education: Goal: Knowledge of General Education information will improve Description: Including pain rating scale, medication(s)/side effects and non-pharmacologic comfort measures Outcome: Progressing   Problem: Clinical Measurements: Goal: Respiratory complications will improve Outcome: Progressing Goal: Cardiovascular complication will be avoided Outcome: Progressing   Problem: Nutrition: Goal: Adequate nutrition will be maintained Outcome: Progressing   Problem: Coping: Goal: Level of anxiety will decrease Outcome: Progressing   Problem: Elimination: Goal: Will not experience complications related to bowel motility Outcome: Progressing   Problem: Pain Managment: Goal: General experience of comfort will improve and/or be controlled Outcome: Progressing   Problem: Safety: Goal: Ability to remain free from injury will improve Outcome: Progressing

## 2024-04-16 NOTE — Progress Notes (Signed)
 Pt transported on vent to CT without difficulty

## 2024-04-16 NOTE — Consult Note (Addendum)
 [address]       [place] [address]             [phone]        [facility] Medical Record #[id number] Date of Birth: June 13, 1976  Referring: No ref. 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  The patient presented on 04/14/2024 to the emergency department with a chief complaint of altered mental status.  The patient is noted to have been recently hospitalized secondary to septic emboli, psoas abscess and osteomyelitis of the lumbar spine, pneumonia, MSSA bacteremia and left the hospital AGAINST MEDICAL ADVICE.  He was readmitted to the CCM service with sepsis from MSSA bacteremia with associated acute hypoxic respiratory failure and  metabolic encephalopathy.  Other significant findings on initial evaluation include thrombocytopenia and acute on chronic anemia.  He did require transfusion with PRBCs.  Intravenous antibiotics were initiated.  He required sedation and placement on ventilator.  He is also noted to have severe protein calorie malnutrition.  Urine toxicology is noted to be positive for benzo, opiates, and cocaine on 03/15/2024.  Lactic acid levels are elevated but trending lower.  Ammonia levels have been in the normal range.  Alk phos and total bilirubin are significantly elevated.  BUN and creatinine are significantly elevated as well consistent with AKI and findings are also consistent with an ion gap metabolic acidosis..  Echocardiogram performed yesterday showed reduced LV ejection fraction by estimation at 40 to 45%.  There is evidence of global hypokinesis and grade 1 diastolic dysfunction.  Right ventricular systolic function was noted to be normal.  Findings  on the tricuspid valve were notable for 2 mobile masses 1 measuring 2.1 cm x 1.1 cm and the other 1.8 x 0.8 cm.  There is evidence of tricuspid regurgitation but it is hard to quantify.  Mitral and aortic valves were without significant findings.  We are asked to evaluate the patient for consideration of angio vac procedure.    Current Activity/ Functional Status: Intubated on vent   Zubrod Score: At the time of surgery this patient's most appropriate activity status/level should be described as: []     0    Normal activity, no symptoms []     1    Restricted in physical strenuous activity but ambulatory, able to do out light work []     2    Ambulatory and capable of self care, unable to do work activities, up and about                 more than 50%  Of the time                            []     3    Only limited self care, in bed greater than 50% of waking hours [x]     4    Completely disabled, no self care, confined to bed or chair []     5    Moribund  Past Medical History:  Diagnosis Date   Anxiety    Back pain    Fracture of rib of right side 01/26/2015  Hypertension    Patient Active Problem List   Diagnosis Date Noted   Sepsis (HCC) 04/14/2024   Severe sepsis (HCC) 04/14/2024   Protein-calorie malnutrition, severe 03/22/2024   Septic embolism (HCC) 03/15/2024   Pressure injury of skin 03/15/2024   Lactic acidosis 03/15/2024   Osteomyelitis of lumbar spine (HCC) 03/15/2024   Thrombocytopenia 03/15/2024   Transaminasemia 03/15/2024   Hypoalbuminemia 03/15/2024   Failure to thrive in adult 03/15/2024   Sepsis due to pneumonia (HCC) 11/07/2016   Leukopenia 11/07/2016   Acute respiratory failure with hypoxia (HCC) 11/07/2016   Anxiety disorder 11/07/2016   Hyperbilirubinemia 11/07/2016   Fever 01/18/2014   CAP (community acquired pneumonia) 05/19/2013   Sinus tachycardia 05/19/2013   Tobacco abuse 05/19/2013     Past Surgical History:  Procedure Laterality Date   TEE  WITHOUT CARDIOVERSION N/A 01/21/2014   Procedure: TRANSESOPHAGEAL ECHOCARDIOGRAM (TEE) with propofol ;  Surgeon: Dorn JULIANNA Ross, MD;  Location: [facility];  Service: Endoscopy;  Laterality: N/A;    Social History   Tobacco Use  Smoking Status Every Day   Current packs/day: 0.50   Types: Cigarettes  Smokeless Tobacco Never    Social History   Substance and Sexual Activity  Alcohol Use No   Comment: rare     Allergies  Allergen Reactions   Penicillins Other (See Comments)    Unknown     Current Facility-Administered Medications  Medication Dose Route Frequency Provider Last Rate Last Admin   ceFAZolin  (ANCEF ) IVPB 2g/100 mL premix  2 g Intravenous Q12H Millen, Jessica B, RPH   Stopped at 04/16/24 0500   Chlorhexidine Gluconate Cloth 2 % PADS 6 each  6 each Topical Daily Smith, Joshua C, NP   6 each at 04/15/24 1745   docusate (COLACE) 50 MG/5ML liquid 100 mg  100 mg Per Tube BID PRN Smith, Joshua C, NP       docusate (COLACE) 50 MG/5ML liquid 100 mg  100 mg Per Tube BID Arloa Folks D, NP   100 mg at 04/16/24 0902   feeding supplement (VITAL 1.5 CAL) liquid 1,000 mL  1,000 mL Per Tube Continuous Hunsucker, Donnice SAUNDERS, MD       fentaNYL  (SUBLIMAZE ) bolus via infusion 25-100 mcg  25-100 mcg Intravenous Q15 min PRN Arloa Folks D, NP   100 mcg at 04/16/24 1000   fentaNYL  in NS (84mcg/ml) infusion-PREMIX  0-400 mcg/hr Intravenous Continuous Arloa Folks D, NP 2.5 mL/hr at 04/16/24 0953 25 mcg/hr at 04/16/24 0953   folic acid  injection 1 mg  1 mg Intravenous Daily Hunsucker, Donnice SAUNDERS, MD   1 mg at 04/16/24 0913   furosemide (LASIX) 120 mg in dextrose  5 % 50 mL IVPB  120 mg Intravenous Once Rosan Deward ORN, NP 62 mL/hr at 04/16/24 0953 Infusion Verify at 04/16/24 0953   ipratropium-albuterol  (DUONEB) 0.5-2.5 (3) MG/3ML nebulizer solution 3 mL  3 mL Nebulization Q6H Hunsucker, Donnice SAUNDERS, MD   3 mL at 04/16/24 9176   multivitamin with minerals tablet 1 tablet  1 tablet  Per Tube Daily Arloa Folks D, NP   1 tablet at 04/16/24 0902   norepinephrine (LEVOPHED) 4mg  in (0.016 mg/mL) premix infusion  0-40 mcg/min Intravenous Titrated Arloa Folks D, NP   Stopped at 04/16/24 0755   ondansetron  (ZOFRAN ) injection 4 mg  4 mg Intravenous Q6H PRN Smith, Joshua C, NP       Oral care mouth rinse  15 mL Mouth Rinse Q2H Hunsucker, Donnice SAUNDERS, MD  15 mL at 04/16/24 0902   Oral care mouth rinse  15 mL Mouth Rinse PRN Hunsucker, Donnice SAUNDERS, MD       pantoprazole (PROTONIX) injection 40 mg  40 mg Intravenous QHS Smith, Joshua C, NP   40 mg at 04/15/24 2109   polyethylene glycol (MIRALAX  / GLYCOLAX ) packet 17 g  17 g Per Tube Daily PRN Smith, Joshua C, NP       polyethylene glycol (MIRALAX  / GLYCOLAX ) packet 17 g  17 g Per Tube Daily Arloa Folks D, NP   17 g at 04/16/24 9097   thiamine  (VITAMIN B1) 500 mg in sodium chloride  0.9 % 50 mL IVPB  500 mg Intravenous TID Hunsucker, Donnice SAUNDERS, MD   Stopped at 04/16/24 0931    Medications Prior to Admission  Medication Sig Dispense Refill Last Dose/Taking   acetaminophen  (TYLENOL ) 500 MG tablet Take 500 mg by mouth every 6 (six) hours as needed for mild pain (pain score 1-3).   Past Week   ALPRAZolam  (XANAX ) 1 MG tablet Take 1 mg by mouth in the morning, at noon, in the evening, and at bedtime.   04/11/2024   lidocaine  (LMX) 4 % cream Apply 1 Application topically as needed (pain).   Past Week   naproxen  sodium (ALEVE ) 220 MG tablet Take 220 mg by mouth daily as needed (pain).   Past Week   Oxycodone  HCl 10 MG TABS Take 1 tablet by mouth in the morning, at noon, in the evening, and at bedtime.   04/11/2024    No family history on file.   Review of Systems:   Review of Systems  Reason unable to perform ROS: patient intubated.        Physical Exam: BP 107/71   Pulse (!) 101   Temp 97.7 F (36.5 C)   Resp (!) 0   Ht 6' (1.829 m)   Wt 64.4 kg   SpO2 100%   BMI 19.26 kg/m    General appearance: alert and  follows simple commands and shakes head Y/N Head: Normocephalic, without obvious abnormality, atraumatic Neck: no adenopathy, supple, symmetrical, trachea midline, and thyroid not enlarged, symmetric, no tenderness/mass/nodules Lymph nodes: Cervical, supraclavicular, and axillary nodes normal. Resp: coarse anteriorly Back: unable to examen Cardio: regular rate and rhythm, S1, S2 normal, no murmur, click, rub or gallop GI: soft, non distended, some mild diffuse tenderness Extremities: extremities normal, atraumatic, no cyanosis or edema and erethematous rash on hands Neurologic: Grossly normal  Diagnostic Studies & Laboratory data:     Recent Radiology Findings:   DG Abd Portable 1V Result Date: 04/16/2024 EXAM: 1 VIEW XRAY OF THE ABDOMEN 04/15/2024 01:20:00 PM COMPARISON: CT from previous day. CLINICAL HISTORY: Encounter for orogastric (OG) tube placement. FINDINGS: LINES, TUBES AND DEVICES: Enteric tube in place with tip and side port overlying the expected region of the gastric lumen. Defibrillator pads noted over the left hemithorax. BOWEL: Nonobstructive bowel gas pattern. SOFT TISSUES: No opaque urinary calculi. BONES: No acute osseous abnormality. LUNGS: Patchy airspace opacities of the lung bases. IMPRESSION: 1. Enteric tube appropriately positioned with tip and side port over the gastric lumen. Electronically signed by: Katheleen Faes MD 04/16/2024 08:45 AM EST RP Workstation: [id number]   CT CHEST ABDOMEN PELVIS W CONTRAST Result Date: 04/16/2024 EXAM: CT CHEST, ABDOMEN AND PELVIS WITH CONTRAST 04/15/2024 11:56:13 PM TECHNIQUE: CT of the chest, abdomen and pelvis was performed with the administration of 60 mL of iohexol  (OMNIPAQUE ) 350 MG/ML injection. Multiplanar reformatted images are provided  for review. Automated exposure control, iterative reconstruction, and/or weight based adjustment of the mA/kV was utilized to reduce the radiation dose to as low as reasonably achievable.  COMPARISON: 04/14/2024 CLINICAL HISTORY: sepsis, AMS, jaundice FINDINGS: CHEST: MEDIASTINUM AND LYMPH NODES: Calcifications in the left anterior descending coronary artery. Heart and pericardium are otherwise unremarkable. The central airways are clear. No mediastinal, hilar or axillary lymphadenopathy. LUNGS AND PLEURA: Numerous bilateral cavitary and solid nodules are similar to prior study compatible with septic emboli. Mosaic attenuation throughout the lungs compatible with air trapping. Consolidation noted in the left lower lobe with adjacent small left pleural effusion, stable. Dependent atelectasis in the right lower lobe. No pneumothorax. ABDOMEN AND PELVIS: LIVER: The liver is unremarkable. GALLBLADDER AND BILE DUCTS: Gallbladder is unremarkable. No biliary ductal dilatation. SPLEEN: Stable splenomegaly. PANCREAS: No acute abnormality. ADRENAL GLANDS: No acute abnormality. KIDNEYS, URETERS AND BLADDER: No stones in the kidneys or ureters. No hydronephrosis. No perinephric or periureteral stranding. Foley catheter present in the bladder, which is decompressed. GI AND BOWEL: Stomach demonstrates no acute abnormality. There is no bowel obstruction. REPRODUCTIVE ORGANS: No acute abnormality. PERITONEUM AND RETROPERITONEUM: Mild to moderate free fluid in the pelvis. No free air. VASCULATURE: Aorta is normal in caliber. ABDOMINAL AND PELVIS LYMPH NODES: No lymphadenopathy. BONES AND SOFT TISSUES: Erosive changes again noted at the T5-T6 level and L2-L3 and L4-L5, possibly reflecting discitis. This has a stable appearance. Generalized anasarca throughout the soft tissues. IMPRESSION: 1. Numerous bilateral cavitary and solid pulmonary nodules compatible with septic emboli, similar to prior study. 2. Consolidation in the left lower lobe with adjacent small left pleural effusion, stable. 3. Erosive changes at the T5-T6 level and L2-T3 and L4-L5 possibly reflecting discitis. 4. Mild to moderate free fluid in the  pelvis. 5. Generalized anasarca throughout the soft tissues. Electronically signed by: Franky Crease MD 04/16/2024 12:22 AM EST RP Workstation: [id number]   CT Head Wo Contrast Result Date: 04/16/2024 EXAM: CT HEAD WITHOUT CONTRAST 04/15/2024 11:56:13 PM TECHNIQUE: CT of the head was performed without the administration of intravenous contrast. Automated exposure control, iterative reconstruction, and/or weight based adjustment of the mA/kV was utilized to reduce the radiation dose to as low as reasonably achievable. COMPARISON: None available. CLINICAL HISTORY: AMS FINDINGS: BRAIN AND VENTRICLES: No acute hemorrhage. No evidence of acute infarct. No hydrocephalus. No extra-axial collection. No mass effect or midline shift. ORBITS: No acute abnormality. SINUSES: Opacified left maxillary sinus. SOFT TISSUES AND SKULL: No acute soft tissue abnormality. No skull fracture. IMPRESSION: 1. No acute intracranial abnormality. 2. Opacified left maxillary sinus. Electronically signed by: Gilmore Molt MD 04/16/2024 12:17 AM EST RP Workstation: [id number]   DG Chest 1 View Result Date: 04/15/2024 EXAM: 1 VIEW(S) XRAY OF THE CHEST 04/15/2024 01:08:00 PM COMPARISON: 04/15/2024 CLINICAL HISTORY: Endotracheally intubated FINDINGS: LINES, TUBES AND DEVICES: Endotracheal tube in place with tip 4.2 cm above the carina. Left IJ central venous catheter with tip in lower SVC. Interval removal of enteric tube. Pacer pads on chest. LUNGS AND PLEURA: Stable patchy bilateral airspace opacities. Persistent left costophrenic angle blunting. No pneumothorax. HEART AND MEDIASTINUM: No acute abnormality of the cardiac and mediastinal silhouettes. BONES AND SOFT TISSUES: No acute osseous abnormality. IMPRESSION: 1. Stable patchy bilateral pneumonia. 2. Removal of NG  tube with other support apparatus appropriately positioned. Electronically signed by: Rockey Kilts MD 04/15/2024 02:24 PM EST RP Workstation: [id number]   ECHOCARDIOGRAM  COMPLETE Result Date: 04/15/2024    ECHOCARDIOGRAM REPORT   Patient Name:   YOANDRI CONGROVE  Sakurai Date of Exam: 04/15/2024 Medical Rec #:  [id number]        Height:       72.0 in Accession #:    [id number]       Weight:       142.0 lb Date of Birth:  1976-01-13        BSA:          1.842 m Patient Age:    48 years         BP:           124/84 mmHg Patient Gender: M                HR:           106 bpm. Exam Location:  Inpatient Procedure: 2D Echo, Cardiac Doppler, Color Doppler, 3D Echo and Intracardiac            Opacification Agent (Both Spectral and Color Flow Doppler were            utilized during procedure). Indications:    Abnormal EKG  History:        Patient has prior history of Echocardiogram examinations, most                 recent 03/15/2024. Arrythmias:Tachycardia,                 Signs/Symptoms:Shortness of Breath; Risk Factors:Current Smoker.  Sonographer:    BERNARDA ROCKS Referring Phys: [id number] JOSHUA C SMITH IMPRESSIONS  1. Left ventricular ejection fraction, by estimation, is 40 to 45%. The left ventricle has mildly decreased function. The left ventricle demonstrates global hypokinesis. Left ventricular diastolic parameters are consistent with Grade I diastolic dysfunction (impaired relaxation). There is abnormal (paradoxical) septal motion, consistent with right ventricular volume overload.  2. RVSP estimated at 76 mmHg. Right ventricular systolic function is normal. The right ventricular size is severely enlarged. There is severely elevated pulmonary artery systolic pressure.  3. Right atrial size was severely dilated.  4. The mitral valve is normal in structure. No evidence of mitral valve regurgitation. No evidence of mitral stenosis.  5. There are two mobile masses seen on the tricuspid valve with one measuring 2.1 cm by 1.1 cm and another (1.8 by 0.8 cm)(#85, #34, #41, #66) near the posterior leaflet with associate TR, though hard to quantify degree of regurgitation.  6. The aortic valve is normal  in structure. Aortic valve regurgitation is not visualized. No aortic stenosis is present.  7. The inferior vena cava is dilated in size with <50% respiratory variability, suggesting right atrial pressure of 15 mmHg. Comparison(s): Compared to prior echo, EF appears more reduced and there is now evidence of endocarditis with multiple vegetations on the tricuspid valve. Conclusion(s)/Recommendation(s): Recommend TEE. FINDINGS  Left Ventricle: Left ventricular ejection fraction, by estimation, is 40 to 45%. The left ventricle has mildly decreased function. The left ventricle demonstrates global hypokinesis. The left ventricular internal cavity size was normal in size. There is  no left ventricular hypertrophy. Abnormal (paradoxical) septal motion, consistent with right ventricular volume overload. Left ventricular diastolic parameters are consistent with Grade I diastolic dysfunction (impaired relaxation). Right Ventricle: RVSP estimated at 76 mmHg. The right ventricular size is severely enlarged. No increase in right ventricular wall thickness. Right ventricular systolic function is normal. There is severely elevated pulmonary artery systolic pressure. The tricuspid regurgitant velocity is 3.90 m/s, and with an assumed right atrial pressure of 15 mmHg, the estimated right ventricular systolic pressure is  75.8 mmHg. Left Atrium: Left atrial size was normal in size. Right Atrium: Right atrial size was severely dilated. Pericardium: Trivial pericardial effusion is present. Mitral Valve: The mitral valve is normal in structure. No evidence of mitral valve regurgitation. No evidence of mitral valve stenosis. MV peak gradient, 2.3 mmHg. The mean mitral valve gradient is 1.0 mmHg. Tricuspid Valve: There are two mobile masses seen on the tricuspid valve with one measuring 2.1 cm by 1.1 cm and another (1.8 by 0.8 cm)(#85, #34, #41, #66) near the posterior leaflet with associate TR, though hard to quantify degree of  regurgitation. The tricuspid valve is normal in structure. Tricuspid valve regurgitation is not demonstrated. No evidence of tricuspid stenosis. Aortic Valve: The aortic valve is normal in structure. Aortic valve regurgitation is not visualized. No aortic stenosis is present. Aortic valve mean gradient measures 3.0 mmHg. Aortic valve peak gradient measures 6.4 mmHg. Pulmonic Valve: The pulmonic valve was normal in structure. Pulmonic valve regurgitation is not visualized. No evidence of pulmonic stenosis. Aorta: The aortic root is normal in size and structure. Venous: The inferior vena cava is dilated in size with less than 50% respiratory variability, suggesting right atrial pressure of 15 mmHg. IAS/Shunts: No atrial level shunt detected by color flow Doppler.  LEFT VENTRICLE PLAX 2D LVIDd:         5.50 cm      Diastology LVIDs:         3.40 cm      LV e' medial:    13.50 cm/s LV PW:         0.90 cm      LV E/e' medial:  5.1 LV IVS:        0.90 cm      LV e' lateral:   15.10 cm/s                             LV E/e' lateral: 4.6  LV Volumes (MOD) LV vol d, MOD A2C: 165.0 ml LV vol d, MOD A4C: 105.3 ml LV vol s, MOD A2C: 92.0 ml LV vol s, MOD A4C: 56.7 ml LV SV MOD A2C:     73.0 ml LV SV MOD A4C:     105.3 ml LV SV MOD BP:      56.9 ml RIGHT VENTRICLE             IVC RV Basal diam:  5.60 cm     IVC diam: 2.30 cm RV S prime:     25.60 cm/s TAPSE (M-mode): 2.4 cm RVSP:           75.8 mmHg LEFT ATRIUM             Index        RIGHT ATRIUM            Index LA diam:        3.60 cm 1.95 cm/m   RA Pressure: 15.00 mmHg LA Vol (A2C):   66.8 ml 36.27 ml/m  RA Area:     25.70 cm LA Vol (A4C):   56.0 ml 30.40 ml/m  RA Volume:   93.60 ml   50.82 ml/m LA Biplane Vol: 61.7 ml 33.50 ml/m  AORTIC VALVE                   PULMONIC VALVE AV Vmax:           126.00 cm/s PV Vmax:  0.96 m/s AV Vmean:          72.100 cm/s PV Peak grad:  3.7 mmHg AV VTI:            0.208 m AV Peak Grad:      6.4 mmHg AV Mean Grad:      3.0 mmHg  LVOT Vmax:         81.10 cm/s LVOT Vmean:        46.200 cm/s LVOT VTI:          0.132 m LVOT/AV VTI ratio: 0.63  AORTA Ao Root diam: 3.60 cm Ao Asc diam:  3.40 cm MITRAL VALVE               TRICUSPID VALVE MV Area (PHT): 6.22 cm    TR Peak grad:   60.8 mmHg MV Peak grad:  2.3 mmHg    TR Mean grad:   36.0 mmHg MV Mean grad:  1.0 mmHg    TR Vmax:        390.00 cm/s MV Vmax:       0.76 m/s    TR Vmean:       280.0 cm/s MV Vmean:      55.1 cm/s   Estimated RAP:  15.00 mmHg MV Decel Time: 122 msec    RVSP:           75.8 mmHg MV E velocity: 69.20 cm/s MV A velocity: 85.10 cm/s  SHUNTS MV E/A ratio:  0.81        Systemic VTI: 0.13 m Joelle Cedars Tonleu Electronically signed by Joelle Cedars Tonleu Signature Date/Time: 04/15/2024/12:49:54 PM    Final    DG CHEST PORT 1 VIEW Result Date: 04/15/2024 EXAM: 1 VIEW(S) XRAY OF THE CHEST 04/15/2024 04:41:43 AM COMPARISON: Radiograph of the chest dated 04/14/2024. CLINICAL HISTORY: Encounter for central line placement. FINDINGS: LINES, TUBES AND DEVICES: Endotracheal tube in place with tip 7.1 cm above the carina. Enteric tube in place with tip and side port overlying the expected region of the gastric lumen, with the side port beneath the gastroesophageal junction in the proximal stomach. Left internal jugular central venous catheter in place with tip overlying the expected region of the superior cavoatrial junction. LUNGS AND PLEURA: Bilateral patchy airspace opacities. Small left pleural effusion. No pulmonary edema. No pneumothorax. HEART AND MEDIASTINUM: No acute abnormality of the cardiac and mediastinal silhouettes. BONES AND SOFT TISSUES: No acute osseous abnormality. IMPRESSION: 1. Bilateral patchy airspace opacities and small left pleural effusion. 2. Left internal jugular central venous line and enteric catheter in appropriate position. No evidence of pneumothorax. Electronically signed by: Evalene Coho MD 04/15/2024 05:02 AM EST RP Workstation: [id number]    CT CHEST ABDOMEN PELVIS WO CONTRAST Result Date: 04/15/2024 EXAM: CT CHEST, ABDOMEN AND PELVIS WITHOUT CONTRAST 04/14/2024 10:11:54 PM TECHNIQUE: CT of the chest, abdomen and pelvis was performed without the administration of intravenous contrast. Multiplanar reformatted images are provided for review. Automated exposure control, iterative reconstruction, and/or weight based adjustment of the mA/kV was utilized to reduce the radiation dose to as low as reasonably achievable. COMPARISON: CTA Chest 05/18/2013. CT of the abdomen and pelvis with contrast from 04/07/2015 and 02/02/2015. CLINICAL HISTORY: Sepsis. FINDINGS: CHEST: MEDIASTINUM AND LYMPH NODES: A small pericardial effusion again collects anteriorly, not seen in 2016 or earlier. The cardiac blood pool is less dense than the myocardium, consistent with anemia. The cardiac size is normal. There is enlargement of the pulmonary trunk measuring 3.4 cm indicating arterial hypertension,  was previously 3.0 cm The pulmonary veins are nondilated. The unenhanced aorta and great vessels are unremarkable. Single mildly enlarged precarinal lymph node measuring 1.1 cm (series 3, axial 31); a subcarinal lymph node to the right is 1.2 cm. Right mid-hilar lymph node is 1.4 cm on image 36. No other adenopathy is seen without contrast, including the axillary spaces. The central airways are clear. The trachea and main bronchi are patent. There is mucus and debris in the left lower lobe main bronchus and segmental bronchi as well. NGT does enter the stomach but the side hole is at the EG junction, and the tube needs to be advanced further 8 to 10 cm. ETT terminates 5.3 cm from the carina. Remote right hemithyroidectomy again noted with left lobe unremarkable. LUNGS AND PLEURA: Small layering left pleural effusion. No right-sided effusion. No pneumothorax. There is adjacent increased consolidation or atelectasis in the posterior basal left lower lobe. Multiple cavitary nodules  were previously noted in the right greater than left lung bases; these nodules are smaller than previously, consistent with partial resolution and healing septic emboli. There are scattered coarse atelectatic or scarring changes in both bases. There is patchy ground glass opacity in the right greater than left upper lobes with slight underlying interstitial thickening, findings most likely due to multifocal pneumonia. Above the plane of prior imaging, there are multiple additional cavitary and small coarsely nodular solid nodules in the upper lobes and superior segments of the lower lobes. Examples include a 1.1 cm right upper lobe cavitary nodule on series 5, axial 54; noncalcified 1.3 cm right upper lobe nodule anteriorly on image 77; 2.6 cm irregularly thick-walled cavitary nodule on image 86; left upper lobe solid 10 mm nodule on image 50; left upper lobe 1 cm nodule on image 67; left lower lobe 1.1 cm solid nodule on image 99; and a pleural-based right lower lobe 1.3 cm solid nodule on image 90. Others are smaller for the most part. ABDOMEN AND PELVIS: LIVER: The liver is 20 cm in length with mild steatosis. No focal abnormality is noted without contrast. GALLBLADDER AND BILE DUCTS: There is layering sludge or tiny stones in the gallbladder without biliary dilatation or wall thickening. SPLEEN: Splenomegaly similar to the last CT, maximum craniocaudal splenic measurement 14.2 cm. No focal abnormality is seen without contrast. PANCREAS: No acute abnormality. ADRENAL GLANDS: No contour deforming mass of the adrenal glands. KIDNEYS, URETERS AND BLADDER: No contour deforming mass of the kidneys. A small Bosniak 1 cyst in the superior pole of the right kidney is unchanged. There is increased perinephric fat stranding without periureteral stranding. This is probably on a congestive basis or fluid overload because there is also generalized mesenteric edema and body wall anasarca. No stones in the kidneys or ureters.  No hydronephrosis. No periureteral stranding. The bladder is catheterized, contracted, and not well seen. GI AND BOWEL: Stomach demonstrates no acute abnormality. There is no bowel obstruction or focal inflammatory process visible through the mesenteric edema. The appendix is normal. REPRODUCTIVE ORGANS: No acute abnormality. PERITONEUM AND RETROPERITONEUM: Mild ascites is seen in the paracolic gutters and pelvis. There is no free air, free hemorrhage, or abscess. VASCULATURE: Aorta is normal in caliber. There is patchy aortoiliac calcific plaque greater than expected for age. ABDOMINAL AND PELVIS LYMPH NODES: No lymphadenopathy. BONES AND SOFT TISSUES: At T5-T6, there is a partial disc collapse and erosive peridiscal endplate changes consistent with spondylodiscitis, with paraspinal soft tissue thickening compatible with paraspinal phlegmon. There is no recent  imaging study or x-rays which included this area. The most recent was a PA and lateral chest from 10/2016, and this was not present at that time. No other significant skeletal findings in the thorax. . L5 is transitional with right hemisacralization. Since 10/02, there are progressive peridiscal endplate destructive changes and irregularity at L2-L3 and to the right of center at L4-L5. Findings consistent with progressive spondylodiscitis. Generalized body wall edema is also increased. No paraspinal abscess is seen. IMPRESSION: 1. Findings consistent with multifocal pneumonia with superimposed multiple cavitary and solid pulmonary nodules, some decreasing in size consistent with evolving septic emboli. Recommended clinical and microbiologic management and follow-up imaging as clinically indicated. 2. 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 No paraspinal abscess is seen. 3. Progressive spondylodiscitis at L2-L3 and L4-L5 since october 2 . No paraspinal abscess is seen. 4. Small pericardial effusion. 5. Anemia. 6. Enlarged pulmonary trunk measuring  3.4 cm. 7. Small left pleural effusion with adjacent left lower lobe atelectasis or consolidation. 8. Generalized edema/anasarca with mild ascites. 9. Stable splenomegaly. 10. These results will be telephoned to the referring provider or the referring providers representative by professional radiology assistant [facility]) personnel, with communication documented in the [facility] dashboard. Electronically signed by: Francis Quam MD 04/15/2024 12:30 AM EST RP Workstation: [id number]   CT HEAD WO CONTRAST ( ) Result Date: 04/14/2024 EXAM: CT HEAD WITHOUT CONTRAST 04/14/2024 10:11:54 PM TECHNIQUE: CT of the head was performed without the administration of intravenous contrast. Automated exposure control, iterative reconstruction, and/or weight based adjustment of the mA/kV was utilized to reduce the radiation dose to as low as reasonably achievable. COMPARISON: None available. CLINICAL HISTORY: Mental status change, unknown cause. FINDINGS: BRAIN AND VENTRICLES: No acute hemorrhage. No evidence of acute infarct. No hydrocephalus. No extra-axial collection. No mass effect or midline shift. ORBITS: No acute abnormality. SINUSES: No acute abnormality. Enteric tube partially visualized coiled within the oropharynx/nasopharynx . SOFT TISSUES AND SKULL: No acute soft tissue abnormality. No skull fracture. IMPRESSION: 1. No acute intracranial abnormality. 2. Chronic left maxillary sinusitis. Electronically signed by: Morene Hoard MD 04/14/2024 10:37 PM EST RP Workstation: [id number]   DG Chest Port 1 View Result Date: 04/14/2024 EXAM: 1 VIEW XRAY OF THE CHEST 04/14/2024 08:56:35 PM COMPARISON: 04/14/2024 CLINICAL HISTORY: HISTORY OF ett FINDINGS: LINES, TUBES AND DEVICES: Endotracheal tube retracted, now 8 cm above the carina. NG tube tip at the GE junction. LUNGS AND PLEURA: Small left pleural effusion. Patchy bilateral airspace disease, right greater than left, worsening since prior study. No pulmonary edema. No  pneumothorax. HEART AND MEDIASTINUM: No acute abnormality of the cardiac and mediastinal silhouettes. BONES AND SOFT TISSUES: No acute osseous abnormality. IMPRESSION: 1. Worsening patchy bilateral airspace disease, right greater than left. 2. Small left pleural effusion. 3. Retraction of the endotracheal tube, now 8 cm above the carina. NG tube tip at the ge junction. Recommend advancing several cm into the stomach. Electronically signed by: Franky Crease MD 04/14/2024 09:03 PM EST RP Workstation: [id number]   DG Chest Port 1 View Result Date: 04/14/2024 EXAM: 1 VIEW(S) XRAY OF THE CHEST 04/14/2024 05:37:09 PM COMPARISON: 03/14/2024 CLINICAL HISTORY: Questionable sepsis - evaluate for abnormality. FINDINGS: LINES, TUBES AND DEVICES: Endotracheal and nasogastric tubes are in grossly good position. LUNGS AND PLEURA: Patchy opacities are seen in the right lower lobe concerning for possible pneumonia or scarring. Minimal left basilar septum atelectasis or scarring is noted as well. No pulmonary edema. No pleural effusion. No pneumothorax. HEART AND MEDIASTINUM: No acute abnormality of the cardiac  and mediastinal silhouettes. BONES AND SOFT TISSUES: No acute osseous abnormality. IMPRESSION: 1. Patchy right lower lobe opacities, suspicious for pneumonia versus scarring. 2. Minimal left basilar atelectasis or scarring. Electronically signed by: Lynwood Seip MD 04/14/2024 05:48 PM EST RP Workstation: [id number]     I have independently reviewed the above radiologic studies and discussed with the patient   Recent Lab Findings: Lab Results  Component Value Date   WBC 15.3 (H) 04/16/2024   HGB 6.3 (LL) 04/16/2024   HCT 19.7 (L) 04/16/2024   PLT 32 (L) 04/16/2024   GLUCOSE 127 (H) 04/16/2024   TRIG 191 (H) 04/15/2024   ALT 8 04/15/2024   AST 23 04/15/2024   NA 142 04/16/2024   K 4.6 04/16/2024   CL 105 04/16/2024   CREATININE 3.72 (H) 04/16/2024   BUN 111 (H) 04/16/2024   CO2 24 04/16/2024   TSH 1.730  03/14/2024   INR 1.4 (H) 04/15/2024   HGBA1C  06/13/2007    5.1 (NOTE)   The ADA recommends the following therapeutic goals for glycemic   control related to Hgb A1C measurement:   Goal of Therapy:   < 7.0% Hgb A1C   Action Suggested:  > 8.0% Hgb A1C   Ref:  Diabetes Care, 22, Suppl. 1, 1999      Assessment / Plan: Tricuspid valve vegetations in the setting of bacteremia/sepsis, septic emboli, pneumonia, recent osteomyelitis of the lumbar spine, psoas abscess. AKI/AGMA Hyperbilirubinemia/elevated alk phos Acute hypoxic respiratory failure Acute metabolic encephalopathy History of substance abuse Acute on chronic anemia Thrombocytopenia Hypertension History of anxiety  Plan: Surgery will review the findings on echocardiogram and review patient to determine if he is a candidate for possible angio vac procedure.  I  spent 25 minutes counseling the patient face to face.   Lemond FORBES Cera, PA-C  04/16/2024 10:06 AM    Agree 48yo male with multiple medical problems, with tricuspid valve endocarditis.  On review of the echo, there is only a small atrial component.  The rest is fairly complex and broad based.  These typically do not debride well with angiovac.    Continue medical therapy.  Virgilene Stryker MALVA Rayas

## 2024-04-16 NOTE — Progress Notes (Signed)
 Nutrition Follow Up  DOCUMENTATION CODES:  Severe malnutrition in context of social or environmental circumstances  INTERVENTION:  CCM approved advancement of tube feeds: Continue tube feeds via Cortrak: begin at 49ml/ h and increase 10 ml q12h until goal rate reached: Vital 1.5 at 25ml/hr ( per day)  60ml ProSource TF20 once daily Provides 2060 kcal, 109g protein and free water daily  Once patient tolerating tube feed advancement, recommend addition of Juven BID to support wound healing.   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 PMH significant for recent admission (10/2-10/13 leaving AMA) for MSSA, PNA, psoas abscesses and lumbar osteomyelitis, tobacco use, IVDU and alcohol use  11/2: admitted, transferred from [facility] to [facility] 11/3: extubated; respiratory distress; re-intubated, cardiac arrest, ROSC  Patient is currently intubated on ventilator support MV: 11.8 L/min Temp (24hrs), Avg:97.9 F (36.6 C), Min:90.7 F (32.6 C), Max:99 F (37.2 C) Cuff MAP pressures: 79-92 mmHg  Pt discussed during ICU rounds and spoke with CCM NP about advancing tube feeds. NP reports pt will likely remain ventilated for a day or two more and approved advancing feeds slowly. Adjusted order with titration instructions. Conducted nutrition focused physical exam which shows severe muscle and fat depletions, indicative of malnutrition.  Suspect malnutrition in the context of IVDU and alcohol abuse. Malnutrition appears to be ongoing issue as pt has previously been diagnosed with severe malnutrition. Recommend continuing to monitor electrolytes as pt experienced 12% drop in potassium in last 24 hr, may show signs of later refeeding symptoms.   Admit weight: 62.5 kg Current weight: 64.4 kg  Drains/lines:  Left internal jugular OGT Catheter: UOP 1100 ml x 12 hr  Medications: colace BID, folic acid , versed , MVI w/ minerals, protonix, miralax  daily Drips: Albumin Precedex @ 0.4 mcg/kg/hr Lasix LR @ 10ml/hr Levo @ 3.25 mcg/min (stopped this morning) thiamine   Labs:  Potassium 4.6<--5.2 (12% decrease) BUN 111 Cr 3.72 Phosphorus 6.7 Alkaline phosphorus 247 Albumin 1.6 GFR 19 CBG's 93-124 x24 hours Hgb 6.8  NUTRITION - FOCUSED PHYSICAL EXAM:  Flowsheet Row Most Recent Value  Orbital Region Unable to assess  Upper Arm Region Severe depletion  Thoracic and Lumbar Region Severe depletion  Buccal Region Unable to assess  Temple Region Severe depletion  Clavicle Bone Region Severe depletion  Clavicle and Acromion Bone Region Severe depletion  Scapular Bone Region Severe depletion  Dorsal Hand Unable to assess  [edema]  Patellar Region Severe depletion  Anterior Thigh Region Severe depletion  Posterior Calf Region Unable to assess  [edema]  Edema (RD Assessment) Mild  [bilateral hands, bilateral calf]  Hair Reviewed  Eyes Unable to assess  Mouth Unable to assess  Skin Reviewed  Nails Reviewed     Diet Order:   Diet Order             Diet NPO time specified  Diet effective now                   EDUCATION NEEDS:  Not appropriate for education at this time  Skin:  Skin Integrity Issues:: Stage II Stage II: Per WOC: 2 ruptured blisters 2 cm x 1 cm x 0.1 cm and 0.5 cm x 0.5 cm x 0.1 cm.  Last BM:  11/3 type 5 medium  Height:   Ht Readings from Last 1 Encounters:  04/14/24 6' (1.829 m)     Weight:   Wt Readings from Last 1 Encounters:  04/16/24 64.4 kg   BMI:  Body mass index is 19.26 kg/m.  Estimated Nutritional Needs:   Kcal:  2000-2200  Protein:  100-115g  Fluid:  >/=2L    Josette Glance, MS, RDN, LDN Clinical Dietitian I Please reach out via secure chat

## 2024-04-16 NOTE — Progress Notes (Signed)
 PT Cancellation Note  Patient Details Name: [name] [name] MRN: [id number] DOB: 10-15-1975   Cancelled Treatment:    Reason Eval/Treat Not Completed: (P) Patient not medically ready Pt RR in 50s. RN request hold for today. PT will follow back tomorrow.   Savreen Gebhardt B. Fleeta Lapidus PT, DPT Acute Rehabilitation Services Please use secure chat or  Call Office [phone]    [name] 04/16/2024, 10:03 AM

## 2024-04-16 NOTE — Progress Notes (Addendum)
 [facility]    Date of Admission:  04/14/2024   Total days of antibiotics 3      ID: [name] [name] is a 48 y.o. male with  disseminated MSSA infection including TV endocarditis, pulmonary septic emboli/pneumonia, discitis/OM, respiratory failure Principal Problem:   Sepsis (HCC) Active Problems:   Severe sepsis (HCC)    Subjective: Required reintubation yesterday. Afebrile  Leukocytosis improving; CT imaging showing more involvement of thoracic spine om  Medications:   Chlorhexidine Gluconate Cloth  6 each Topical Daily   docusate  100 mg Per Tube BID   folic acid   1 mg Intravenous Daily   ipratropium-albuterol   3 mL Nebulization Q6H   multivitamin with minerals  1 tablet Per Tube Daily   mouth rinse  15 mL Mouth Rinse Q2H   pantoprazole (PROTONIX) IV  40 mg Intravenous QHS   polyethylene glycol  17 g Per Tube Daily    Objective: Vital signs in last 24 hours: Temp:  [90.7 F (32.6 C)-99.5 F (37.5 C)] 99.5 F (37.5 C) (11/04 1620) Pulse Rate:  [91-107] 99 (11/04 1620) Resp:  [0-30] 21 (11/04 1620) BP: (89-128)/(50-91) 111/79 (11/04 1620) SpO2:  [95 %-100 %] 100 % (11/04 1620) FiO2 (%):  [40 %] 40 % (11/04 1620) Weight:  [64.4 kg] 64.4 kg (11/04 0703)  Physical Exam  Constitutional: He is oriented to person, opens eyes to verbal stimuli. He appears well-developed and well-nourished. No distress.  HENT:  Mouth/Throat: OETT Cardiovascular: Normal rate, regular rhythm and normal heart sounds. Exam reveals no gallop and no friction rub.  No murmur heard.  Pulmonary/Chest: Effort normal and breath sounds normal. No respiratory distress. He has no wheezes.  Abdominal: Soft. Bowel sounds are normal. He exhibits no distension. There is no tenderness.  Neurological: moves all appendages. Legs equal strength Skin: Skin is warm and dry. No rash noted. No erythema.  Psychiatric: He has a normal mood and affect. His behavior is normal.    Lab  Results Recent Labs    04/15/24 0740 04/15/24 1415 04/15/24 1527 04/16/24 0417 04/16/24 1017  WBC 24.4*  --   --  15.3*  --   HGB 7.7*  --  8.2* 6.3* 6.8*  HCT 24.4*  --  24.0* 19.7* 20.8*  NA  --  143 141 142  --   K  --  5.3* 5.2* 4.6  --   CL  --  105  --  105  --   CO2  --  23  --  24  --   BUN  --  101*  --  111*  --   CREATININE  --  3.62*  --  3.72*  --    Liver Panel Recent Labs    04/14/24 1531 04/15/24 0050  PROT 7.3 6.0*  ALBUMIN 2.7* 1.6*  AST 35 23  ALT 6 8  ALKPHOS 372* 247*  BILITOT 4.1* 4.9*   Sedimentation Rate No results for input(s): ESRSEDRATE in the last 72 hours. C-Reactive Protein No results for input(s): CRP in the last 72 hours.  Microbiology: 11/4 blood cx ngtd 11/2 trach cx -reincubating 11/2 blood cx ngtd 11/2 urine cx MSSA 11/2 blood cx MSSA Studies/Results: DG Abd Portable 1V Result Date: 04/16/2024 EXAM: 1 VIEW XRAY OF THE ABDOMEN 04/15/2024 01:20:00 PM COMPARISON: CT from previous day. CLINICAL HISTORY: Encounter for orogastric (OG) tube placement. FINDINGS: LINES, TUBES AND DEVICES: Enteric tube in place with tip and side port overlying the expected  region of the gastric lumen. Defibrillator pads noted over the left hemithorax. BOWEL: Nonobstructive bowel gas pattern. SOFT TISSUES: No opaque urinary calculi. BONES: No acute osseous abnormality. LUNGS: Patchy airspace opacities of the lung bases. IMPRESSION: 1. Enteric tube appropriately positioned with tip and side port over the gastric lumen. Electronically signed by: Katheleen Faes MD 04/16/2024 08:45 AM EST RP Workstation: [id number]   CT CHEST ABDOMEN PELVIS W CONTRAST Result Date: 04/16/2024 EXAM: CT CHEST, ABDOMEN AND PELVIS WITH CONTRAST 04/15/2024 11:56:13 PM TECHNIQUE: CT of the chest, abdomen and pelvis was performed with the administration of 60 mL of iohexol  (OMNIPAQUE ) 350 MG/ML injection. Multiplanar reformatted images are provided for review. Automated exposure control,  iterative reconstruction, and/or weight based adjustment of the mA/kV was utilized to reduce the radiation dose to as low as reasonably achievable. COMPARISON: 04/14/2024 CLINICAL HISTORY: sepsis, AMS, jaundice FINDINGS: CHEST: MEDIASTINUM AND LYMPH NODES: Calcifications in the left anterior descending coronary artery. Heart and pericardium are otherwise unremarkable. The central airways are clear. No mediastinal, hilar or axillary lymphadenopathy. LUNGS AND PLEURA: Numerous bilateral cavitary and solid nodules are similar to prior study compatible with septic emboli. Mosaic attenuation throughout the lungs compatible with air trapping. Consolidation noted in the left lower lobe with adjacent small left pleural effusion, stable. Dependent atelectasis in the right lower lobe. No pneumothorax. ABDOMEN AND PELVIS: LIVER: The liver is unremarkable. GALLBLADDER AND BILE DUCTS: Gallbladder is unremarkable. No biliary ductal dilatation. SPLEEN: Stable splenomegaly. PANCREAS: No acute abnormality. ADRENAL GLANDS: No acute abnormality. KIDNEYS, URETERS AND BLADDER: No stones in the kidneys or ureters. No hydronephrosis. No perinephric or periureteral stranding. Foley catheter present in the bladder, which is decompressed. GI AND BOWEL: Stomach demonstrates no acute abnormality. There is no bowel obstruction. REPRODUCTIVE ORGANS: No acute abnormality. PERITONEUM AND RETROPERITONEUM: Mild to moderate free fluid in the pelvis. No free air. VASCULATURE: Aorta is normal in caliber. ABDOMINAL AND PELVIS LYMPH NODES: No lymphadenopathy. BONES AND SOFT TISSUES: Erosive changes again noted at the T5-T6 level and L2-L3 and L4-L5, possibly reflecting discitis. This has a stable appearance. Generalized anasarca throughout the soft tissues. IMPRESSION: 1. Numerous bilateral cavitary and solid pulmonary nodules compatible with septic emboli, similar to prior study. 2. Consolidation in the left lower lobe with adjacent small left pleural  effusion, stable. 3. Erosive changes at the T5-T6 level and L2-T3 and L4-L5 possibly reflecting discitis. 4. Mild to moderate free fluid in the pelvis. 5. Generalized anasarca throughout the soft tissues. Electronically signed by: Franky Crease MD 04/16/2024 12:22 AM EST RP Workstation: [id number]   CT Head Wo Contrast Result Date: 04/16/2024 EXAM: CT HEAD WITHOUT CONTRAST 04/15/2024 11:56:13 PM TECHNIQUE: CT of the head was performed without the administration of intravenous contrast. Automated exposure control, iterative reconstruction, and/or weight based adjustment of the mA/kV was utilized to reduce the radiation dose to as low as reasonably achievable. COMPARISON: None available. CLINICAL HISTORY: AMS FINDINGS: BRAIN AND VENTRICLES: No acute hemorrhage. No evidence of acute infarct. No hydrocephalus. No extra-axial collection. No mass effect or midline shift. ORBITS: No acute abnormality. SINUSES: Opacified left maxillary sinus. SOFT TISSUES AND SKULL: No acute soft tissue abnormality. No skull fracture. IMPRESSION: 1. No acute intracranial abnormality. 2. Opacified left maxillary sinus. Electronically signed by: Gilmore Molt MD 04/16/2024 12:17 AM EST RP Workstation: [id number]   DG Chest 1 View Result Date: 04/15/2024 EXAM: 1 VIEW(S) XRAY OF THE CHEST 04/15/2024 01:08:00 PM COMPARISON: 04/15/2024 CLINICAL HISTORY: Endotracheally intubated FINDINGS: LINES, TUBES AND DEVICES: Endotracheal tube in place  with tip 4.2 cm above the carina. Left IJ central venous catheter with tip in lower SVC. Interval removal of enteric tube. Pacer pads on chest. LUNGS AND PLEURA: Stable patchy bilateral airspace opacities. Persistent left costophrenic angle blunting. No pneumothorax. HEART AND MEDIASTINUM: No acute abnormality of the cardiac and mediastinal silhouettes. BONES AND SOFT TISSUES: No acute osseous abnormality. IMPRESSION: 1. Stable patchy bilateral pneumonia. 2. Removal of NG  tube with other support  apparatus appropriately positioned. Electronically signed by: Rockey Kilts MD 04/15/2024 02:24 PM EST RP Workstation: [id number]   ECHOCARDIOGRAM COMPLETE Result Date: 04/15/2024    ECHOCARDIOGRAM REPORT   Patient Name:   LAIRD RUNNION [facility] Date of Exam: 04/15/2024 Medical Rec #:  [id number]        Height:       72.0 in Accession #:    [id number]       Weight:       142.0 lb Date of Birth:  1975/06/28        BSA:          1.842 m Patient Age:    48 years         BP:           124/84 mmHg Patient Gender: M                HR:           106 bpm. Exam Location:  Inpatient Procedure: 2D Echo, Cardiac Doppler, Color Doppler, 3D Echo and Intracardiac            Opacification Agent (Both Spectral and Color Flow Doppler were            utilized during procedure). Indications:    Abnormal EKG  History:        Patient has prior history of Echocardiogram examinations, most                 recent 03/15/2024. Arrythmias:Tachycardia,                 Signs/Symptoms:Shortness of Breath; Risk Factors:Current Smoker.  Sonographer:    BERNARDA ROCKS Referring Phys: [id number] JOSHUA C SMITH IMPRESSIONS  1. Left ventricular ejection fraction, by estimation, is 40 to 45%. The left ventricle has mildly decreased function. The left ventricle demonstrates global hypokinesis. Left ventricular diastolic parameters are consistent with Grade I diastolic dysfunction (impaired relaxation). There is abnormal (paradoxical) septal motion, consistent with right ventricular volume overload.  2. RVSP estimated at 76 mmHg. Right ventricular systolic function is normal. The right ventricular size is severely enlarged. There is severely elevated pulmonary artery systolic pressure.  3. Right atrial size was severely dilated.  4. The mitral valve is normal in structure. No evidence of mitral valve regurgitation. No evidence of mitral stenosis.  5. There are two mobile masses seen on the tricuspid valve with one measuring 2.1 cm by 1.1 cm and another (1.8 by 0.8  cm)(#85, #34, #41, #66) near the posterior leaflet with associate TR, though hard to quantify degree of regurgitation.  6. The aortic valve is normal in structure. Aortic valve regurgitation is not visualized. No aortic stenosis is present.  7. The inferior vena cava is dilated in size with <50% respiratory variability, suggesting right atrial pressure of 15 mmHg. Comparison(s): Compared to prior echo, EF appears more reduced and there is now evidence of endocarditis with multiple vegetations on the tricuspid valve. Conclusion(s)/Recommendation(s): Recommend TEE. FINDINGS  Left Ventricle: Left ventricular ejection fraction, by estimation, is 40 to  45%. The left ventricle has mildly decreased function. The left ventricle demonstrates global hypokinesis. The left ventricular internal cavity size was normal in size. There is  no left ventricular hypertrophy. Abnormal (paradoxical) septal motion, consistent with right ventricular volume overload. Left ventricular diastolic parameters are consistent with Grade I diastolic dysfunction (impaired relaxation). Right Ventricle: RVSP estimated at 76 mmHg. The right ventricular size is severely enlarged. No increase in right ventricular wall thickness. Right ventricular systolic function is normal. There is severely elevated pulmonary artery systolic pressure. The tricuspid regurgitant velocity is 3.90 m/s, and with an assumed right atrial pressure of 15 mmHg, the estimated right ventricular systolic pressure is 75.8 mmHg. Left Atrium: Left atrial size was normal in size. Right Atrium: Right atrial size was severely dilated. Pericardium: Trivial pericardial effusion is present. Mitral Valve: The mitral valve is normal in structure. No evidence of mitral valve regurgitation. No evidence of mitral valve stenosis. MV peak gradient, 2.3 mmHg. The mean mitral valve gradient is 1.0 mmHg. Tricuspid Valve: There are two mobile masses seen on the tricuspid valve with one measuring 2.1  cm by 1.1 cm and another (1.8 by 0.8 cm)(#85, #34, #41, #66) near the posterior leaflet with associate TR, though hard to quantify degree of regurgitation. The tricuspid valve is normal in structure. Tricuspid valve regurgitation is not demonstrated. No evidence of tricuspid stenosis. Aortic Valve: The aortic valve is normal in structure. Aortic valve regurgitation is not visualized. No aortic stenosis is present. Aortic valve mean gradient measures 3.0 mmHg. Aortic valve peak gradient measures 6.4 mmHg. Pulmonic Valve: The pulmonic valve was normal in structure. Pulmonic valve regurgitation is not visualized. No evidence of pulmonic stenosis. Aorta: The aortic root is normal in size and structure. Venous: The inferior vena cava is dilated in size with less than 50% respiratory variability, suggesting right atrial pressure of 15 mmHg. IAS/Shunts: No atrial level shunt detected by color flow Doppler.  LEFT VENTRICLE PLAX 2D LVIDd:         5.50 cm      Diastology LVIDs:         3.40 cm      LV e' medial:    13.50 cm/s LV PW:         0.90 cm      LV E/e' medial:  5.1 LV IVS:        0.90 cm      LV e' lateral:   15.10 cm/s                             LV E/e' lateral: 4.6  LV Volumes (MOD) LV vol d, MOD A2C: 165.0 ml LV vol d, MOD A4C: 105.3 ml LV vol s, MOD A2C: 92.0 ml LV vol s, MOD A4C: 56.7 ml LV SV MOD A2C:     73.0 ml LV SV MOD A4C:     105.3 ml LV SV MOD BP:      56.9 ml RIGHT VENTRICLE             IVC RV Basal diam:  5.60 cm     IVC diam: 2.30 cm RV S prime:     25.60 cm/s TAPSE (M-mode): 2.4 cm RVSP:           75.8 mmHg LEFT ATRIUM             Index        RIGHT ATRIUM  Index LA diam:        3.60 cm 1.95 cm/m   RA Pressure: 15.00 mmHg LA Vol (A2C):   66.8 ml 36.27 ml/m  RA Area:     25.70 cm LA Vol (A4C):   56.0 ml 30.40 ml/m  RA Volume:   93.60 ml   50.82 ml/m LA Biplane Vol: 61.7 ml 33.50 ml/m  AORTIC VALVE                   PULMONIC VALVE AV Vmax:           126.00 cm/s PV Vmax:       0.96 m/s  AV Vmean:          72.100 cm/s PV Peak grad:  3.7 mmHg AV VTI:            0.208 m AV Peak Grad:      6.4 mmHg AV Mean Grad:      3.0 mmHg LVOT Vmax:         81.10 cm/s LVOT Vmean:        46.200 cm/s LVOT VTI:          0.132 m LVOT/AV VTI ratio: 0.63  AORTA Ao Root diam: 3.60 cm Ao Asc diam:  3.40 cm MITRAL VALVE               TRICUSPID VALVE MV Area (PHT): 6.22 cm    TR Peak grad:   60.8 mmHg MV Peak grad:  2.3 mmHg    TR Mean grad:   36.0 mmHg MV Mean grad:  1.0 mmHg    TR Vmax:        390.00 cm/s MV Vmax:       0.76 m/s    TR Vmean:       280.0 cm/s MV Vmean:      55.1 cm/s   Estimated RAP:  15.00 mmHg MV Decel Time: 122 msec    RVSP:           75.8 mmHg MV E velocity: 69.20 cm/s MV A velocity: 85.10 cm/s  SHUNTS MV E/A ratio:  0.81        Systemic VTI: 0.13 m Joelle Cedars Tonleu Electronically signed by Joelle Cedars Tonleu Signature Date/Time: 04/15/2024/12:49:54 PM    Final    DG CHEST PORT 1 VIEW Result Date: 04/15/2024 EXAM: 1 VIEW(S) XRAY OF THE CHEST 04/15/2024 04:41:43 AM COMPARISON: Radiograph of the chest dated 04/14/2024. CLINICAL HISTORY: Encounter for central line placement. FINDINGS: LINES, TUBES AND DEVICES: Endotracheal tube in place with tip 7.1 cm above the carina. Enteric tube in place with tip and side port overlying the expected region of the gastric lumen, with the side port beneath the gastroesophageal junction in the proximal stomach. Left internal jugular central venous catheter in place with tip overlying the expected region of the superior cavoatrial junction. LUNGS AND PLEURA: Bilateral patchy airspace opacities. Small left pleural effusion. No pulmonary edema. No pneumothorax. HEART AND MEDIASTINUM: No acute abnormality of the cardiac and mediastinal silhouettes. BONES AND SOFT TISSUES: No acute osseous abnormality. IMPRESSION: 1. Bilateral patchy airspace opacities and small left pleural effusion. 2. Left internal jugular central venous line and enteric catheter in appropriate  position. No evidence of pneumothorax. Electronically signed by: Evalene Coho MD 04/15/2024 05:02 AM EST RP Workstation: [id number]   CT CHEST ABDOMEN PELVIS WO CONTRAST Result Date: 04/15/2024 EXAM: CT CHEST, ABDOMEN AND PELVIS WITHOUT CONTRAST 04/14/2024 10:11:54 PM TECHNIQUE: CT of the chest, abdomen and  pelvis was performed without the administration of intravenous contrast. Multiplanar reformatted images are provided for review. Automated exposure control, iterative reconstruction, and/or weight based adjustment of the mA/kV was utilized to reduce the radiation dose to as low as reasonably achievable. COMPARISON: CTA Chest 05/18/2013. CT of the abdomen and pelvis with contrast from 04/07/2015 and 02/02/2015. CLINICAL HISTORY: Sepsis. FINDINGS: CHEST: MEDIASTINUM AND LYMPH NODES: A small pericardial effusion again collects anteriorly, not seen in 2016 or earlier. The cardiac blood pool is less dense than the myocardium, consistent with anemia. The cardiac size is normal. There is enlargement of the pulmonary trunk measuring 3.4 cm indicating arterial hypertension, was previously 3.0 cm The pulmonary veins are nondilated. The unenhanced aorta and great vessels are unremarkable. Single mildly enlarged precarinal lymph node measuring 1.1 cm (series 3, axial 31); a subcarinal lymph node to the right is 1.2 cm. Right mid-hilar lymph node is 1.4 cm on image 36. No other adenopathy is seen without contrast, including the axillary spaces. The central airways are clear. The trachea and main bronchi are patent. There is mucus and debris in the left lower lobe main bronchus and segmental bronchi as well. NGT does enter the stomach but the side hole is at the EG junction, and the tube needs to be advanced further 8 to 10 cm. ETT terminates 5.3 cm from the carina. Remote right hemithyroidectomy again noted with left lobe unremarkable. LUNGS AND PLEURA: Small layering left pleural effusion. No right-sided effusion. No  pneumothorax. There is adjacent increased consolidation or atelectasis in the posterior basal left lower lobe. Multiple cavitary nodules were previously noted in the right greater than left lung bases; these nodules are smaller than previously, consistent with partial resolution and healing septic emboli. There are scattered coarse atelectatic or scarring changes in both bases. There is patchy ground glass opacity in the right greater than left upper lobes with slight underlying interstitial thickening, findings most likely due to multifocal pneumonia. Above the plane of prior imaging, there are multiple additional cavitary and small coarsely nodular solid nodules in the upper lobes and superior segments of the lower lobes. Examples include a 1.1 cm right upper lobe cavitary nodule on series 5, axial 54; noncalcified 1.3 cm right upper lobe nodule anteriorly on image 77; 2.6 cm irregularly thick-walled cavitary nodule on image 86; left upper lobe solid 10 mm nodule on image 50; left upper lobe 1 cm nodule on image 67; left lower lobe 1.1 cm solid nodule on image 99; and a pleural-based right lower lobe 1.3 cm solid nodule on image 90. Others are smaller for the most part. ABDOMEN AND PELVIS: LIVER: The liver is 20 cm in length with mild steatosis. No focal abnormality is noted without contrast. GALLBLADDER AND BILE DUCTS: There is layering sludge or tiny stones in the gallbladder without biliary dilatation or wall thickening. SPLEEN: Splenomegaly similar to the last CT, maximum craniocaudal splenic measurement 14.2 cm. No focal abnormality is seen without contrast. PANCREAS: No acute abnormality. ADRENAL GLANDS: No contour deforming mass of the adrenal glands. KIDNEYS, URETERS AND BLADDER: No contour deforming mass of the kidneys. A small Bosniak 1 cyst in the superior pole of the right kidney is unchanged. There is increased perinephric fat stranding without periureteral stranding. This is probably on a congestive  basis or fluid overload because there is also generalized mesenteric edema and body wall anasarca. No stones in the kidneys or ureters. No hydronephrosis. No periureteral stranding. The bladder is catheterized, contracted, and not well seen.  GI AND BOWEL: Stomach demonstrates no acute abnormality. There is no bowel obstruction or focal inflammatory process visible through the mesenteric edema. The appendix is normal. REPRODUCTIVE ORGANS: No acute abnormality. PERITONEUM AND RETROPERITONEUM: Mild ascites is seen in the paracolic gutters and pelvis. There is no free air, free hemorrhage, or abscess. VASCULATURE: Aorta is normal in caliber. There is patchy aortoiliac calcific plaque greater than expected for age. ABDOMINAL AND PELVIS LYMPH NODES: No lymphadenopathy. BONES AND SOFT TISSUES: At T5-T6, there is a partial disc collapse and erosive peridiscal endplate changes consistent with spondylodiscitis, with paraspinal soft tissue thickening compatible with paraspinal phlegmon. There is no recent imaging study or x-rays which included this area. The most recent was a PA and lateral chest from 10/2016, and this was not present at that time. No other significant skeletal findings in the thorax. . L5 is transitional with right hemisacralization. Since 10/02, there are progressive peridiscal endplate destructive changes and irregularity at L2-L3 and to the right of center at L4-L5. Findings consistent with progressive spondylodiscitis. Generalized body wall edema is also increased. No paraspinal abscess is seen. IMPRESSION: 1. Findings consistent with multifocal pneumonia with superimposed multiple cavitary and solid pulmonary nodules, some decreasing in size consistent with evolving septic emboli. Recommended clinical and microbiologic management and follow-up imaging as clinically indicated. 2. 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 No paraspinal abscess is seen. 3. Progressive spondylodiscitis at L2-L3 and  L4-L5 since october 2 . No paraspinal abscess is seen. 4. Small pericardial effusion. 5. Anemia. 6. Enlarged pulmonary trunk measuring 3.4 cm. 7. Small left pleural effusion with adjacent left lower lobe atelectasis or consolidation. 8. Generalized edema/anasarca with mild ascites. 9. Stable splenomegaly. 10. These results will be telephoned to the referring provider or the referring providers representative by professional radiology assistant [facility]) personnel, with communication documented in the [facility] dashboard. Electronically signed by: Francis Quam MD 04/15/2024 12:30 AM EST RP Workstation: [id number]   CT HEAD WO CONTRAST ( ) Result Date: 04/14/2024 EXAM: CT HEAD WITHOUT CONTRAST 04/14/2024 10:11:54 PM TECHNIQUE: CT of the head was performed without the administration of intravenous contrast. Automated exposure control, iterative reconstruction, and/or weight based adjustment of the mA/kV was utilized to reduce the radiation dose to as low as reasonably achievable. COMPARISON: None available. CLINICAL HISTORY: Mental status change, unknown cause. FINDINGS: BRAIN AND VENTRICLES: No acute hemorrhage. No evidence of acute infarct. No hydrocephalus. No extra-axial collection. No mass effect or midline shift. ORBITS: No acute abnormality. SINUSES: No acute abnormality. Enteric tube partially visualized coiled within the oropharynx/nasopharynx . SOFT TISSUES AND SKULL: No acute soft tissue abnormality. No skull fracture. IMPRESSION: 1. No acute intracranial abnormality. 2. Chronic left maxillary sinusitis. Electronically signed by: Morene Hoard MD 04/14/2024 10:37 PM EST RP Workstation: [id number]   DG Chest Port 1 View Result Date: 04/14/2024 EXAM: 1 VIEW XRAY OF THE CHEST 04/14/2024 08:56:35 PM COMPARISON: 04/14/2024 CLINICAL HISTORY: HISTORY OF ett FINDINGS: LINES, TUBES AND DEVICES: Endotracheal tube retracted, now 8 cm above the carina. NG tube tip at the GE junction. LUNGS AND PLEURA: Small left  pleural effusion. Patchy bilateral airspace disease, right greater than left, worsening since prior study. No pulmonary edema. No pneumothorax. HEART AND MEDIASTINUM: No acute abnormality of the cardiac and mediastinal silhouettes. BONES AND SOFT TISSUES: No acute osseous abnormality. IMPRESSION: 1. Worsening patchy bilateral airspace disease, right greater than left. 2. Small left pleural effusion. 3. Retraction of the endotracheal tube, now 8 cm above the carina. NG tube tip at the ge junction.  Recommend advancing several cm into the stomach. Electronically signed by: Franky Crease MD 04/14/2024 09:03 PM EST RP Workstation: [id number]   DG Chest Port 1 View Result Date: 04/14/2024 EXAM: 1 VIEW(S) XRAY OF THE CHEST 04/14/2024 05:37:09 PM COMPARISON: 03/14/2024 CLINICAL HISTORY: Questionable sepsis - evaluate for abnormality. FINDINGS: LINES, TUBES AND DEVICES: Endotracheal and nasogastric tubes are in grossly good position. LUNGS AND PLEURA: Patchy opacities are seen in the right lower lobe concerning for possible pneumonia or scarring. Minimal left basilar septum atelectasis or scarring is noted as well. No pulmonary edema. No pleural effusion. No pneumothorax. HEART AND MEDIASTINUM: No acute abnormality of the cardiac and mediastinal silhouettes. BONES AND SOFT TISSUES: No acute osseous abnormality. IMPRESSION: 1. Patchy right lower lobe opacities, suspicious for pneumonia versus scarring. 2. Minimal left basilar atelectasis or scarring. Electronically signed by: Lynwood Seip MD 04/14/2024 05:48 PM EST RP Workstation: [id number]     Assessment/Plan: 48yo M with disseminated MSSA infection and respiratory failure/fluid overload on vent support  -continue with cefazolin  renally dosed - aki = trend cr , perhaps, diuresis will also help  Thoracic discitis/om= new from early images in October, recommend to get mri of thoracic/lumbar spine looking for epidural abscess. Consider to image once renal function  improves, and get contrasted images  Pulmonary septic emboli/MSSA pneumonia with cavitary lesions  = also would be treated with cefazolin   Leukocytosis = resolved  evaluation of this patient requires complex antimicrobial therapy evaluation and counseling and isolation needs for disease transmission risk assessment and mitigation.   I personally spent a total of 51 minutes in the care of the patient today including preparing to see the patient, getting/reviewing separately obtained history, performing a medically appropriate exam/evaluation, referring and communicating with other health care professionals, documenting clinical information in the EHR, independently interpreting results, and communicating results.  [facility] Pager: [phone]  04/16/2024, 4:56 PM

## 2024-04-16 NOTE — Progress Notes (Signed)
 eLink Physician-Brief Progress Note Patient Name: [name] [name] DOB: [birth year] MRN: [id number]   Date of Service  04/16/2024  HPI/Events of Note  Patient intubated earlier with worsening assessor muscle use with brief PEA cardiac arrest.  Respiratory failure, shock, consumptive coagulopathy  Wheezes noted on exam, received.  DuoNeb 1938  eICU Interventions  Add as needed DuoNebs     Intervention Category Intermediate Interventions: Respiratory distress - evaluation and management  Aprel Egelhoff 04/16/2024, 9:12 PM

## 2024-04-17 ENCOUNTER — Inpatient Hospital Stay (HOSPITAL_COMMUNITY): Payer: MEDICAID

## 2024-04-17 DIAGNOSIS — Z930 Tracheostomy status: Secondary | ICD-10-CM

## 2024-04-17 DIAGNOSIS — R509 Fever, unspecified: Secondary | ICD-10-CM

## 2024-04-17 DIAGNOSIS — N179 Acute kidney failure, unspecified: Secondary | ICD-10-CM

## 2024-04-17 DIAGNOSIS — J9600 Acute respiratory failure, unspecified whether with hypoxia or hypercapnia: Secondary | ICD-10-CM

## 2024-04-17 DIAGNOSIS — I330 Acute and subacute infective endocarditis: Secondary | ICD-10-CM

## 2024-04-17 LAB — CULTURE, RESPIRATORY W GRAM STAIN: Culture: NORMAL

## 2024-04-17 LAB — POCT I-STAT 7, (LYTES, BLD GAS, ICA,H+H)
Acid-base deficit: 3 mmol/L — ABNORMAL HIGH (ref 0.0–2.0)
Acid-base deficit: 5 mmol/L — ABNORMAL HIGH (ref 0.0–2.0)
Bicarbonate: 23.3 mmol/L (ref 20.0–28.0)
Bicarbonate: 24.9 mmol/L (ref 20.0–28.0)
Calcium, Ion: 1.18 mmol/L (ref 1.15–1.40)
Calcium, Ion: 1.31 mmol/L (ref 1.15–1.40)
HCT: 19 % — ABNORMAL LOW (ref 39.0–52.0)
HCT: 24 % — ABNORMAL LOW (ref 39.0–52.0)
Hemoglobin: 6.5 g/dL — CL (ref 13.0–17.0)
Hemoglobin: 8.2 g/dL — ABNORMAL LOW (ref 13.0–17.0)
O2 Saturation: 99 %
O2 Saturation: 100 %
Patient temperature: 37.5
Patient temperature: 37
Potassium: 5.2 mmol/L — ABNORMAL HIGH (ref 3.5–5.1)
Potassium: 4.7 mmol/L (ref 3.5–5.1)
Sodium: 137 mmol/L (ref 135–145)
Sodium: 142 mmol/L (ref 135–145)
TCO2: 25 mmol/L (ref 22–32)
TCO2: 27 mmol/L (ref 22–32)
pCO2 arterial: 46.8 mmHg (ref 32–48)
pCO2 arterial: 80 mmHg (ref 32–48)
pH, Arterial: 7.307 — ABNORMAL LOW (ref 7.35–7.45)
pH, Arterial: 7.101 — CL (ref 7.35–7.45)
pO2, Arterial: 142 mmHg — ABNORMAL HIGH (ref 83–108)
pO2, Arterial: 300 mmHg — ABNORMAL HIGH (ref 83–108)

## 2024-04-17 LAB — TYPE AND SCREEN
ABO/RH(D): A POS
Antibody Screen: NEGATIVE
Unit division: 0
Unit division: 0
Unit division: 0
Unit division: 0

## 2024-04-17 LAB — BPAM RBC
Blood Product Expiration Date: 202511152359
Blood Product Expiration Date: 202511152359
Blood Product Expiration Date: 202511242359
Blood Product Expiration Date: 202511242359
ISSUE DATE / TIME: 202511030233
ISSUE DATE / TIME: 202511030233
ISSUE DATE / TIME: 202511040609
ISSUE DATE / TIME: 202511041236
Unit Type and Rh: 6200
Unit Type and Rh: 6200
Unit Type and Rh: 6200
Unit Type and Rh: 6200

## 2024-04-17 LAB — CULTURE, BLOOD (ROUTINE X 2): Special Requests: ADEQUATE

## 2024-04-17 LAB — BASIC METABOLIC PANEL WITH GFR
Anion gap: 15 (ref 5–15)
BUN: 98 mg/dL — ABNORMAL HIGH (ref 6–20)
CO2: 24 mmol/L (ref 22–32)
Calcium: 6.4 mg/dL — CL (ref 8.9–10.3)
Chloride: 105 mmol/L (ref 98–111)
Creatinine, Ser: 3.32 mg/dL — ABNORMAL HIGH (ref 0.61–1.24)
GFR, Estimated: 22 mL/min — ABNORMAL LOW (ref >60)
Glucose, Bld: 154 mg/dL — ABNORMAL HIGH (ref 70–99)
Potassium: 3.8 mmol/L (ref 3.5–5.1)
Sodium: 144 mmol/L (ref 135–145)

## 2024-04-17 LAB — CBC
HCT: 23.5 % — ABNORMAL LOW (ref 39.0–52.0)
Hemoglobin: 7.7 g/dL — ABNORMAL LOW (ref 13.0–17.0)
MCH: 29.3 pg (ref 26.0–34.0)
MCHC: 32.8 g/dL (ref 30.0–36.0)
MCV: 89.4 fL (ref 80.0–100.0)
Platelets: 31 K/uL — ABNORMAL LOW (ref 150–400)
RBC: 2.63 MIL/uL — ABNORMAL LOW (ref 4.22–5.81)
RDW: 16.5 % — ABNORMAL HIGH (ref 11.5–15.5)
WBC: 12.2 K/uL — ABNORMAL HIGH (ref 4.0–10.5)
nRBC: 0.5 % — ABNORMAL HIGH (ref 0.0–0.2)

## 2024-04-17 LAB — MAGNESIUM: Magnesium: 2.2 mg/dL (ref 1.7–2.4)

## 2024-04-17 LAB — LEGIONELLA PNEUMOPHILA SEROGP 1 UR AG: L. pneumophila Serogp 1 Ur Ag: NEGATIVE

## 2024-04-17 LAB — GLUCOSE, CAPILLARY
Glucose-Capillary: 123 mg/dL — ABNORMAL HIGH (ref 70–99)
Glucose-Capillary: 120 mg/dL — ABNORMAL HIGH (ref 70–99)
Glucose-Capillary: 143 mg/dL — ABNORMAL HIGH (ref 70–99)
Glucose-Capillary: 137 mg/dL — ABNORMAL HIGH (ref 70–99)
Glucose-Capillary: 139 mg/dL — ABNORMAL HIGH (ref 70–99)
Glucose-Capillary: 166 mg/dL — ABNORMAL HIGH (ref 70–99)

## 2024-04-17 LAB — PHOSPHORUS: Phosphorus: 6.6 mg/dL — ABNORMAL HIGH (ref 2.5–4.6)

## 2024-04-17 MED ORDER — CHLORHEXIDINE GLUCONATE CLOTH 2 % EX PADS
6.0000 | MEDICATED_PAD | CUTANEOUS | Status: DC
  Administered 2024-04-18 – 2024-05-23 (×33): 6 via TOPICAL

## 2024-04-17 MED ORDER — METHOCARBAMOL 500 MG PO TABS
750.0000 mg | ORAL_TABLET | Freq: Three times a day (TID) | ORAL | Status: DC | PRN
  Administered 2024-04-17 – 2024-04-21 (×6): 750 mg
  Filled 2024-04-17 (×7): qty 2

## 2024-04-17 NOTE — Progress Notes (Signed)
 [facility]    Date of Admission:  04/14/2024   Total days of antibiotics 4     ID: [name] [name] is a 48 y.o. male with  disseminated MSSA Principal Problem:   Sepsis (HCC) Active Problems:   Severe sepsis (HCC)    Subjective: Still remains febrile, and intubated, trying to wean from vent today  Labs showing improvement in leukocytosis, repeat blood cx remain negative; cr slowly improving. Cr 3.32 today  Medications:   Chlorhexidine Gluconate Cloth  6 each Topical Daily   docusate  100 mg Per Tube BID   folic acid   1 mg Intravenous Daily   ipratropium-albuterol   3 mL Nebulization Q6H   multivitamin with minerals  1 tablet Per Tube Daily   mouth rinse  15 mL Mouth Rinse Q2H   pantoprazole (PROTONIX) IV  40 mg Intravenous QHS   polyethylene glycol  17 g Per Tube Daily    Objective: Vital signs in last 24 hours: Temp:  [96.4 F (35.8 C)-101.1 F (38.4 C)] 100.8 F (38.2 C) (11/05 1517) Pulse Rate:  [71-124] 99 (11/05 1600) Resp:  [0-30] 30 (11/05 1600) BP: (107-144)/(71-100) 128/87 (11/05 1600) SpO2:  [96 %-100 %] 98 % (11/05 1600) FiO2 (%):  [40 %] 40 % (11/05 1534) Weight:  [70.7 kg] 70.7 kg (11/05 0403)  Physical Exam  Constitutional: He is oriented to person,remains intubated. He appears well-developed and well-nourished. No distress.  HENT:  Mouth/Throat: OETT in place Cardiovascular: Normal rate, regular rhythm and normal heart sounds. Exam reveals no gallop and no friction rub.  No murmur heard.  Pulmonary/Chest: Effort normal and breath sounds normal. No respiratory distress. He has no wheezes.  Abdominal: Soft. Bowel sounds are normal. He exhibits no distension. There is no tenderness.  Skin: Skin is warm and dry. No rash noted. No erythema.    Lab Results Recent Labs    04/16/24 0417 04/16/24 1017 04/16/24 2230 04/17/24 0330  WBC 15.3*  --   --  12.2*  HGB 6.3*   < > 7.7* 7.7*  HCT 19.7*   < > 23.3* 23.5*  NA 142   --   --  144  K 4.6  --   --  3.8  CL 105  --   --  105  CO2 24  --   --  24  BUN 111*  --   --  98*  CREATININE 3.72*  --   --  3.32*   < > = values in this interval not displayed.   Liver Panel Recent Labs    04/15/24 0050  PROT 6.0*  ALBUMIN 1.6*  AST 23  ALT 8  ALKPHOS 247*  BILITOT 4.9*    Microbiology: reviewed Studies/Results: DG CHEST PORT 1 VIEW Result Date: 04/17/2024 EXAM: 1 VIEW(S) XRAY OF THE CHEST 04/17/2024 11:55:00 AM COMPARISON: Prior studies are available. CLINICAL HISTORY: Encounter for intubation. FINDINGS: LINES, TUBES AND DEVICES: Endotracheal and nasogastric tubes are in good position. Left internal jugular catheter is in good position. LUNGS AND PLEURA: Stable bilateral patchy opacities are noted consistent with multifocal infection with septic emboli as noted on prior CT scan. Left basilar opacity is noted concerning for pneumonia with effusion. No pulmonary edema. No pneumothorax. HEART AND MEDIASTINUM: No acute abnormality of the cardiac and mediastinal silhouettes. BONES AND SOFT TISSUES: No acute osseous abnormality. IMPRESSION: 1. Stable bilateral patchy opacities consistent with multifocal infection with septic emboli. 2. Left basilar opacity concerning for pneumonia with  effusion. Electronically signed by: Lynwood Seip MD 04/17/2024 12:40 PM EST RP Workstation: [id number]   CT CHEST ABDOMEN PELVIS W CONTRAST Result Date: 04/16/2024 EXAM: CT CHEST, ABDOMEN AND PELVIS WITH CONTRAST 04/15/2024 11:56:13 PM TECHNIQUE: CT of the chest, abdomen and pelvis was performed with the administration of 60 mL of iohexol  (OMNIPAQUE ) 350 MG/ML injection. Multiplanar reformatted images are provided for review. Automated exposure control, iterative reconstruction, and/or weight based adjustment of the mA/kV was utilized to reduce the radiation dose to as low as reasonably achievable. COMPARISON: 04/14/2024 CLINICAL HISTORY: sepsis, AMS, jaundice FINDINGS: CHEST: MEDIASTINUM AND  LYMPH NODES: Calcifications in the left anterior descending coronary artery. Heart and pericardium are otherwise unremarkable. The central airways are clear. No mediastinal, hilar or axillary lymphadenopathy. LUNGS AND PLEURA: Numerous bilateral cavitary and solid nodules are similar to prior study compatible with septic emboli. Mosaic attenuation throughout the lungs compatible with air trapping. Consolidation noted in the left lower lobe with adjacent small left pleural effusion, stable. Dependent atelectasis in the right lower lobe. No pneumothorax. ABDOMEN AND PELVIS: LIVER: The liver is unremarkable. GALLBLADDER AND BILE DUCTS: Gallbladder is unremarkable. No biliary ductal dilatation. SPLEEN: Stable splenomegaly. PANCREAS: No acute abnormality. ADRENAL GLANDS: No acute abnormality. KIDNEYS, URETERS AND BLADDER: No stones in the kidneys or ureters. No hydronephrosis. No perinephric or periureteral stranding. Foley catheter present in the bladder, which is decompressed. GI AND BOWEL: Stomach demonstrates no acute abnormality. There is no bowel obstruction. REPRODUCTIVE ORGANS: No acute abnormality. PERITONEUM AND RETROPERITONEUM: Mild to moderate free fluid in the pelvis. No free air. VASCULATURE: Aorta is normal in caliber. ABDOMINAL AND PELVIS LYMPH NODES: No lymphadenopathy. BONES AND SOFT TISSUES: Erosive changes again noted at the T5-T6 level and L2-L3 and L4-L5, possibly reflecting discitis. This has a stable appearance. Generalized anasarca throughout the soft tissues. IMPRESSION: 1. Numerous bilateral cavitary and solid pulmonary nodules compatible with septic emboli, similar to prior study. 2. Consolidation in the left lower lobe with adjacent small left pleural effusion, stable. 3. Erosive changes at the T5-T6 level and L2-T3 and L4-L5 possibly reflecting discitis. 4. Mild to moderate free fluid in the pelvis. 5. Generalized anasarca throughout the soft tissues. Electronically signed by: Franky Crease MD  04/16/2024 12:22 AM EST RP Workstation: [id number]   CT Head Wo Contrast Result Date: 04/16/2024 EXAM: CT HEAD WITHOUT CONTRAST 04/15/2024 11:56:13 PM TECHNIQUE: CT of the head was performed without the administration of intravenous contrast. Automated exposure control, iterative reconstruction, and/or weight based adjustment of the mA/kV was utilized to reduce the radiation dose to as low as reasonably achievable. COMPARISON: None available. CLINICAL HISTORY: AMS FINDINGS: BRAIN AND VENTRICLES: No acute hemorrhage. No evidence of acute infarct. No hydrocephalus. No extra-axial collection. No mass effect or midline shift. ORBITS: No acute abnormality. SINUSES: Opacified left maxillary sinus. SOFT TISSUES AND SKULL: No acute soft tissue abnormality. No skull fracture. IMPRESSION: 1. No acute intracranial abnormality. 2. Opacified left maxillary sinus. 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 Continue to do neuro support  Acute respiratory failure s/p intubation = defer to primary team for management.  Aki = continue to monitor cr function, will adjust abtx as needed  evaluation of this patient requires complex antimicrobial  therapy evaluation and counseling and isolation needs for disease transmission risk assessment and mitigation.   [facility] Pager: [phone]  04/17/2024, 5:07 PM

## 2024-04-17 NOTE — Evaluation (Signed)
 Occupational Therapy Evaluation Patient Details Name: [name] [name] MRN: [id number] DOB: [birth year] Today's Date: 04/17/2024   History of Present Illness   48 yo M PMH recently admitted with MSSA PNA MSSA bacteremia psoas abscesses and lumbar osteo 03/14/24-03/25/24 (left AMA) who presented to [facility] ED 04/14/24 w AMS. Arrived to ED covered in feces,  altered, with poor airway protection and was intubated     Clinical Impressions Pt received in supine, agreeable for OT visit. Co-eval with PT to safely address functional mobility & ADLs. Pt presenting with B wrist restraints and mitts, RN reporting pt pulling on lines including ETT earlier today. He was AOX3, follows 1-step commands consistently. With restraints doffed, pt intermittently reaching for ETT but redirectable. He presents today generally weak and appearing deconditioned. He functionally required mod-max A +2 for supine<>sit via log roll and up to min A to sustain sitting EOB. Heavily reliant on BUE for support; min A for bed level LB ADLs and min A for UB ADLs at EOB. Hemodynamics stable throughout on vent at 40% FiO2 / PEEP 5.   Pt is currently functioning below baseline and would benefit from ongoing acute OT services to progress towards safe discharge and to facilitate return to prior level of function. Current recommendation is post-acute rehab (< 3 hours/day).     If plan is discharge home, recommend the following:   Two people to help with walking and/or transfers;Two people to help with bathing/dressing/bathroom;Direct supervision/assist for medications management;Direct supervision/assist for financial management;Assist for transportation;Help with stairs or ramp for entrance     Functional Status Assessment   Patient has had a recent decline in their functional status and demonstrates the ability to make significant improvements in function in a reasonable and predictable amount of time.     Equipment  Recommendations   Other (comment) (defer)     Recommendations for Other Services         Precautions/Restrictions   Precautions Precautions: Fall Recall of Precautions/Restrictions: Intact Restrictions Weight Bearing Restrictions Per Provider Order: No     Mobility Bed Mobility Overal bed mobility: Needs Assistance Bed Mobility: Rolling, Sidelying to Sit, Sit to Supine Rolling: Min assist Sidelying to sit: Max assist, +2 for physical assistance, HOB elevated, Used rails   Sit to supine: Max assist, +2 for physical assistance, HOB elevated, Used rails   General bed mobility comments: cued through log roll technique, exited to the R side towards vent, cued to reach for bed rails to assist    Transfers                   General transfer comment: deferred 2/2 pt fatiguing with EOB sitting/back pain      Balance                                           ADL either performed or assessed with clinical judgement   ADL Overall ADL's : Needs assistance/impaired Eating/Feeding: NPO Eating/Feeding Details (indicate cue type and reason): OGT Grooming: Minimal assistance;Sitting;Wash/dry face Grooming Details (indicate cue type and reason): EOB             Lower Body Dressing: Minimal assistance;Cueing for back precautions;Bed level Lower Body Dressing Details (indicate cue type and reason): hip hike technique in supine to don B socks, assist to fully thread socks over toes     Toileting- Clothing Manipulation and  Hygiene: Total assistance               Vision Baseline Vision/History:  (no eye glasses present at time of OT eval)       Perception         Praxis         Pertinent Vitals/Pain Pain Assessment Pain Assessment: Faces Faces Pain Scale: Hurts little more Pain Location: back Pain Descriptors / Indicators: Discomfort Pain Intervention(s): Monitored during session, Repositioned     Extremity/Trunk Assessment  Upper Extremity Assessment Upper Extremity Assessment: Defer to OT evaluation RUE Deficits / Details: grossly 3+/5 to MMT   Lower Extremity Assessment Lower Extremity Assessment: Generalized weakness   Cervical / Trunk Assessment Cervical / Trunk Assessment: Kyphotic   Communication Communication Communication: Impaired Factors Affecting Communication: Trach/intubated (ETT)   Cognition Arousal: Alert Behavior During Therapy: WFL for tasks assessed/performed Cognition: Cognition impaired   Orientation impairments: Time Awareness: Online awareness impaired   Attention impairment (select first level of impairment): Selective attention   OT - Cognition Comments: communicating via head and finger gestures, following 1-step commands appropriately; impulsive and with little regard for extensive lines/tubes                 Following commands: Intact       Cueing  General Comments   Cueing Techniques: Verbal cues;Tactile cues  VSS on vent with FiO2 40% / PEEP 5   Exercises     Shoulder Instructions      Home Living Family/patient expects to be discharged to:: Private residence Living Arrangements: Spouse/significant other Available Help at Discharge: Family;Available 24 hours/day Type of Home: House Home Access: Stairs to enter [employer] of Steps: half step into house Entrance Stairs-Rails: None Home Layout: One level     Bathroom Shower/Tub: Chief Strategy Officer: Standard Bathroom Accessibility: Yes   Home Equipment: Agricultural Consultant (2 wheels);Wheelchair - manual;BSC/3in1;Cane - single point;Tub bench          Prior Functioning/Environment Prior Level of Function : Independent/Modified Independent;Driving             Mobility Comments: 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 ADLs Comments: Independent    OT Problem List: Decreased strength;Decreased range of motion;Decreased activity  tolerance;Impaired balance (sitting and/or standing);Decreased cognition;Decreased safety awareness;Pain   OT Treatment/Interventions: Self-care/ADL training;Therapeutic exercise;DME and/or AE instruction;Therapeutic activities;Cognitive remediation/compensation;Patient/family education;Balance training      OT Goals(Current goals can be found in the care plan section)   Acute Rehab OT Goals Patient Stated Goal: pt unable to state 2/2 ETT OT Goal Formulation: With patient Time For Goal Achievement: 05/01/24 Potential to Achieve Goals: Good   OT Frequency:  Min 2X/week    Co-evaluation PT/OT/SLP Co-Evaluation/Treatment: Yes Reason for Co-Treatment: To address functional/ADL transfers;Complexity of the patient's impairments (multi-system involvement) PT goals addressed during session: Mobility/safety with mobility;Balance;Proper use of DME OT goals addressed during session: ADL's and self-care      AM-PAC OT 6 Clicks Daily Activity     Outcome Measure Help from another person eating meals?: Total Help from another person taking care of personal grooming?: A Little Help from another person toileting, which includes using toliet, bedpan, or urinal?: Total Help from another person bathing (including washing, rinsing, drying)?: A Lot Help from another person to put on and taking off regular upper body clothing?: A Little Help from another person to put on and taking off regular lower body clothing?: A Little 6 Click Score:  13   End of Session Nurse Communication: Mobility status (stable hemodynamics during session)  Activity Tolerance: Patient tolerated treatment well Patient left: in bed;with call bell/phone within reach;with bed alarm set;with restraints reapplied;with SCD's reapplied (B wrist restraints & mitts re-applied)  OT Visit Diagnosis: Unsteadiness on feet (R26.81);Muscle weakness (generalized) (M62.81);Pain Pain - part of body:  (back)                Time:  8550-8477 OT Time Calculation (min): 33 min Charges:  OT General Charges $OT Visit: 1 Visit OT Evaluation $OT Eval Moderate Complexity: 1 Mod  Rikki BIRCH., MSOT, OTR/L Acute Rehabilitation Services [phone] Secure Chat Preferred  Rikki Milch 04/17/2024, 5:17 PM

## 2024-04-17 NOTE — Progress Notes (Addendum)
 NAME:  [name] [name], MRN:  [id number], DOB:  1976/03/12, LOS: 3 ADMISSION DATE:  04/14/2024, CONSULTATION DATE:  04/14/24 REFERRING MD:  Suzette - [facility] EM, CHIEF COMPLAINT:   AMS  History of Present Illness:  48 yo M PMH recently admitted with MSSA PNA MSSA bacteremia psoas abscesses and lumbar osteo 03/14/24-03/25/24 (left AMA) who presented to [facility] ED 04/14/24 w AMS. Arrived to ED covered in feces,  altered, with poor airway protection and was intubated CXR with RLL opacities  Confirmed bilateral septic pulmonary emboli, tricuspid valve endocarditis.  Required intubation and mechanical ventilation   Pertinent  Medical History  MSSA bacteremia MSSA PNA Lumbar osteomyelitis  Psoas abscess  Severe protein calorie malnutrition FTT in adult  Significant Hospital Events: Including procedures, antibiotic start and stop dates in addition to other pertinent events   11/2 [facility] ED w AMS. Intubated. Started on vanc mero. Txf request to [place] to [facility]  11/3 extubated, developed respiratory distress requiring reintubation. Brief cardiac arrest during intubation.  11/3 transthoracic echocardiogram > LVEF 40-45%, global hypokinesis, grade 1 diastolic dysfunction, normal RV function.  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  No mitral or aortic valve involvement 11/3 CT chest, abdomen, pelvis >> numerous bilateral cavitary and solid nodules consistent with septic emboli, left lower lobe consolidation with a small pleural effusion, right basilar atelectasis.  Moderate free fluid in the pelvis.  Erosive changes at T5-T6, L2-L3, L4-L5 suggestive of discitis 11/3 CT head >> no acute abnormality.  Opacified left maxillary sinus  Interim History / Subjective:  Precedex 0.9, fentanyl  100 0.40, PEEP 5.  Tolerating PSV I/O+ 6.7 L total SCr 3.72 > 3.32.  BUN 98 Phosphorus 6.6, calcium 6.4   Objective    Blood pressure 121/79, pulse 81, temperature (!) 100.9 F (38.3 C),  resp. rate (!) 26, height 6' (1.829 m), weight 70.7 kg, SpO2 96%.    Vent Mode: PSV;CPAP FiO2 (%):  [40 %] 40 % Set Rate:  [26 bmp] 26 bmp Vt Set:  [500 mL-620 mL] 620 mL PEEP:  [5 cmH20] 5 cmH20 Pressure Support:  [5 cmH20] 5 cmH20 Plateau Pressure:  [17 cmH20-24 cmH20] 17 cmH20   Intake/Output Summary (Last 24 hours) at 04/17/2024 0801 Last data filed at 04/17/2024 0700 Gross per 24 hour  Intake 2866.53 ml  Output 3450 ml  Net -583.47 ml   Filed Weights   04/15/24 0336 04/16/24 0703 04/17/24 0403  Weight: 64.4 kg 64.4 kg 70.7 kg   Examination:  General: Thin chronically ill-appearing gentleman intubated Neuro: He is awake, follows commands, good strength.  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 Left AMA 10/13, not sure that he was dc w abx successfully but 4wk ancef  was rec -Blood cultures this admission again consistent with MSSA bacteremia -Urine culture 60k colonies staph.  P: - Appreciate ID and thoracic surgery consultation - Continue cefazolin  - Echocardiogram shows small atrial component of his tricuspid valve endocarditis with fairly complex broad-based involvement.  Based on this cardiothoracic surgery does not recommend debridement via angio vac.  Medical therapy recommended - No clear indication for TEE currently.  Could consider depending on any need to better characterize the valve - Would like to pursue MRI of the spine with contrast to characterize possible discitis, osteomyelitis, once he  stabilizes hemodynamically, his improvement in his renal function  Acute hypoxic respiratory failure Intubated for airway protection in setting of acute metabolic encephalopathy  -suspect in the setting of severe sepsis, but also has history of substance abuse -septic embolization to both lungs. P:  -Multifactorial respiratory failure, failed extubation on 11/3.  Suspect component of cardiac decompensation in absence of positive pressure given rapid decline postextubation.  Will work to optimize volume status, diurese as able - Continue SBT's and assess for repeat attempt extubation.  Would favor T-piece trial before extubation given concern that his valve might contribute to hemodynamic instability, respiratory failure in absence of intrathoracic positive pressure - VAP bundle in place - Sedation as per PAD protocol, minimize as able  Acute Metabolic Encephalopathy AMS  Hx of substance abuse  -Given multifactorial, likely most significant contributor his sepsis.  Superimposed on substance use P: -Continue to follow mental status and minimize sedating medication as able  AKI AGMA  -Appears to be stabilizing with hemodynamic support Hyperkalemia P: R -Continue to follow urine output, serum creatinine - Would like to push diuresis if his renal function and hemodynamics will allow  Hyperbilirubinemia  Elevated Alk phos  -Likely shock liver P: -No evidence of intra-abdominal abnormality, hepatic abnormality on CT - Follow LFT trend  Anemia Thrombocytopenia- plt 32  - anemia stable from prior admission  Suspect thrombocytopenia in setting of severe sepsis.  DIC panel reassuring P: -No schistocytes on repeat DIC panel 11/4 - Following CBC trend - Transfusion goal hemoglobin 7.0, platelets 10 (or 50 if any evidence of active bleeding  Hx of HTN P: -Home antihypertensive regimen on hold - Follow telemetry   Hx of Anxiety  P:  -Home meds on hold - Sedation as per PAD  protocol  Critical care time: 34 minutes    Lamar Chris, MD, PhD 04/17/2024, 8:25 AM [facility] [phone] or if no answer before 7:00PM call [phone] For any issues after 7:00PM please call eLink [phone]

## 2024-04-17 NOTE — Plan of Care (Signed)
  Problem: Education: Goal: Knowledge of General Education information will improve Description: Including pain rating scale, medication(s)/side effects and non-pharmacologic comfort measures Outcome: Progressing   Problem: Health Behavior/Discharge Planning: Goal: Ability to manage health-related needs will improve Outcome: Progressing   Problem: Clinical Measurements: Goal: Ability to maintain clinical measurements within normal limits will improve Outcome: Progressing Goal: Will remain free from infection Outcome: Progressing   Problem: Nutrition: Goal: Adequate nutrition will be maintained Outcome: Progressing   Problem: Skin Integrity: Goal: Risk for impaired skin integrity will decrease Outcome: Progressing

## 2024-04-17 NOTE — Progress Notes (Signed)
 eLink Physician-Brief Progress Note Patient Name: [name] [name] DOB: [birth year] MRN: [id number]   Date of Service  04/17/2024  HPI/Events of Note  stating his back is hurting despite PRN fent boluses, low fever  eICU Interventions  Add methocarbamol No treatment for elevated temp    0623 - NSVT, Kcl  Intervention Category Intermediate Interventions: Pain - evaluation and management  Carianna Lague 04/17/2024, 9:52 PM

## 2024-04-17 NOTE — Progress Notes (Signed)
   04/17/24 0747  Daily Weaning Assessment  Daily Assessment of Readiness to Wean Wean protocol criteria met (SBT performed)  SBT Method CPAP 5 cm H20 and PS 5 cm H20  Weaning Start Time 0747  Patient response Passed (Tolerated well)

## 2024-04-17 NOTE — Evaluation (Signed)
 Physical Therapy Evaluation Patient Details Name: [name] [name] MRN: [id number] DOB: [birth year] Today's Date: 04/17/2024  History of Present Illness  48 yo M  who presented to [facility] ED 04/14/24 w AMS. Arrived to ED covered in feces, altered, with poor airway protection and was intubated and transferred to [facility] 11/3 extubated developed respiratory distress requiring reintubation, Transthoracic echocardiogram revealed 2 mobile masses on the tricuspid valve CT chest, abdomen, pelvis >> numerous bilateral cavitary and solid nodules consistent with septic emboli, left lower lobe consolidation with a small pleural effusion, right basilar atelectasis. PMH recently admitted with MSSA PNA MSSA bacteremia psoas abscesses and lumbar osteo 03/14/24-03/25/24 (left AMA)  Clinical Impression  Pt in restraints on fentanyl  on entry, RN warned of pt trying to pull ET tube out. Pt cognition appears slightly impaired but cooperative with removal of restraints, only reaching for his tube intermittently more due to irritation and easily redirected. Pt is agreeable to therapy and comes to EoB with maxAx2 sits EoB for ~ 8 min before fatigue and totalAx2 for return to supine. Per prior hospitalization notes in mid October pt was completely independent, living with his wife in single story home with one step to enter. Patient will benefit from intensive inpatient follow-up therapy, >3 hours/day. PT will continue to follow acutely to progress his mobility.        If plan is discharge home, recommend the following: Assist for transportation;Help with stairs or ramp for entrance;A lot of help with walking and/or transfers;A lot of help with bathing/dressing/bathroom;Assistance with cooking/housework;Direct supervision/assist for medications management;Direct supervision/assist for financial management   Can travel by private vehicle    No    Equipment Recommendations None recommended by PT  Recommendations for Other Services        Functional Status Assessment Patient has had a recent decline in their functional status and demonstrates the ability to make significant improvements in function in a reasonable and predictable amount of time.     Precautions / Restrictions Precautions Precautions: Fall Recall of Precautions/Restrictions: Intact Precaution/Restrictions Comments: on vent, ET tube, NG tube, indwelling catheter, wrist restraints and mittens Restrictions Weight Bearing Restrictions Per Provider Order: No      Mobility  Bed Mobility Overal bed mobility: Needs Assistance Bed Mobility: Supine to Sit, Sit to Supine Rolling: Min assist Sidelying to sit: Max assist, +2 for safety/equipment Supine to sit: Total assist, +2 for safety/equipment     General bed mobility comments: pt able to roll onto his side with min A but needs maxAx2 for coming to seated EoB, and total Ax2 for returning to bed        Balance Overall balance assessment: Needs assistance Sitting-balance support: Bilateral upper extremity supported, Feet unsupported Sitting balance-Leahy Scale: Zero Sitting balance - Comments: seated at EOB ~8 min with total to min A for posterior support and knee blocking in front Postural control: Posterior lean                                   Pertinent Vitals/Pain Pain Assessment Pain Assessment: Faces Faces Pain Scale: Hurts even more Breathing: normal Negative Vocalization: none Facial Expression: smiling or inexpressive Body Language: relaxed Consolability: no need to console PAINAD Score: 0 Pain Location: low back with movement and sacrum when lying on it Pain Descriptors / Indicators: Discomfort, Sore Pain Intervention(s): Limited activity within patient's tolerance, Monitored during session, Repositioned    Home Living Family/patient expects  to be discharged to:: Private residence Living Arrangements: Spouse/significant other Available Help at Discharge:  Family;Available 24 hours/day Type of Home: House Home Access: Stairs to enter Entrance Stairs-Rails: None Entrance Stairs-Number of Steps: half step into house   Home Layout: One level Home Equipment: Agricultural Consultant (2 wheels);Wheelchair - manual;BSC/3in1;Cane - single point;Tub bench      Prior Function Prior Level of Function : Patient poor historian/Family not available             Mobility Comments: reports independence ADLs Comments: reports independence however arrived from home covered in feces     Extremity/Trunk Assessment   Upper Extremity Assessment Upper Extremity Assessment: Defer to OT evaluation    Lower Extremity Assessment Lower Extremity Assessment: Generalized weakness    Cervical / Trunk Assessment Cervical / Trunk Assessment: Kyphotic  Communication   Communication Communication: No apparent difficulties    Cognition Arousal: Alert Behavior During Therapy: Impulsive, Restless   PT - Cognitive impairments: Difficult to assess Difficult to assess due to: Tracheostomy, Intubated                     PT - Cognition Comments: inconsistent answers Following commands: Intact       Cueing Cueing Techniques: Verbal cues, Tactile cues     General Comments General comments (skin integrity, edema, etc.): PEEP 5, FiO2 40%, RR 26, 10L O2, VSS with transfer to EoB        Assessment/Plan    PT Assessment Patient needs continued PT services  PT Problem List Decreased strength;Decreased activity tolerance;Decreased balance;Decreased mobility       PT Treatment Interventions DME instruction;Gait training;Stair training;Functional mobility training;Therapeutic activities;Therapeutic exercise;Balance training;Patient/family education    PT Goals (Current goals can be found in the Care Plan section)  Acute Rehab PT Goals Patient Stated Goal: return home with his family to assist PT Goal Formulation: With patient Time For Goal Achievement:  05/01/24 Potential to Achieve Goals: Good    Frequency Min 3X/week     Co-evaluation PT/OT/SLP Co-Evaluation/Treatment: Yes Reason for Co-Treatment: To address functional/ADL transfers;Complexity of the patient's impairments (multi-system involvement) PT goals addressed during session: Mobility/safety with mobility;Balance;Proper use of DME OT goals addressed during session: ADL's and self-care       AM-PAC PT 6 Clicks Mobility  Outcome Measure Help needed turning from your back to your side while in a flat bed without using bedrails?: A Little Help needed moving from lying on your back to sitting on the side of a flat bed without using bedrails?: Total Help needed moving to and from a bed to a chair (including a wheelchair)?: Total Help needed standing up from a chair using your arms (e.g., wheelchair or bedside chair)?: Total Help needed to walk in hospital room?: Total Help needed climbing 3-5 steps with a railing? : Total 6 Click Score: 8    End of Session   Activity Tolerance: Patient tolerated treatment well Patient left: in bed;with call bell/phone within reach;with family/visitor present;with restraints reapplied;with SCD's reapplied;with bed alarm set Nurse Communication: Mobility status PT Visit Diagnosis: Unsteadiness on feet (R26.81);Muscle weakness (generalized) (M62.81);Difficulty in walking, not elsewhere classified (R26.2)    Time: 8477-8446 PT Time Calculation (min) (ACUTE ONLY): 31 min   Charges:   PT Evaluation $PT Eval Moderate Complexity: 1 Mod   PT General Charges $$ ACUTE PT VISIT: 1 Visit         Jhoselyn Ruffini B. Fleeta Lapidus PT, DPT Acute Rehabilitation Services Please use secure chat or  Call Office [phone]   Almarie KATHEE Salinas Fleet 04/17/2024, 5:11 PM

## 2024-04-18 ENCOUNTER — Inpatient Hospital Stay (HOSPITAL_COMMUNITY): Payer: MEDICAID

## 2024-04-18 DIAGNOSIS — E8721 Acute metabolic acidosis: Secondary | ICD-10-CM

## 2024-04-18 LAB — BASIC METABOLIC PANEL WITH GFR
Anion gap: 14 (ref 5–15)
BUN: 94 mg/dL — ABNORMAL HIGH (ref 6–20)
CO2: 25 mmol/L (ref 22–32)
Calcium: 7 mg/dL — ABNORMAL LOW (ref 8.9–10.3)
Chloride: 110 mmol/L (ref 98–111)
Creatinine, Ser: 3.13 mg/dL — ABNORMAL HIGH (ref 0.61–1.24)
GFR, Estimated: 24 mL/min — ABNORMAL LOW (ref >60)
Glucose, Bld: 169 mg/dL — ABNORMAL HIGH (ref 70–99)
Potassium: 3.8 mmol/L (ref 3.5–5.1)
Sodium: 149 mmol/L — ABNORMAL HIGH (ref 135–145)

## 2024-04-18 LAB — GLUCOSE, CAPILLARY
Glucose-Capillary: 129 mg/dL — ABNORMAL HIGH (ref 70–99)
Glucose-Capillary: 131 mg/dL — ABNORMAL HIGH (ref 70–99)
Glucose-Capillary: 133 mg/dL — ABNORMAL HIGH (ref 70–99)
Glucose-Capillary: 155 mg/dL — ABNORMAL HIGH (ref 70–99)
Glucose-Capillary: 159 mg/dL — ABNORMAL HIGH (ref 70–99)
Glucose-Capillary: 180 mg/dL — ABNORMAL HIGH (ref 70–99)

## 2024-04-18 LAB — CBC
HCT: 22.5 % — ABNORMAL LOW (ref 39.0–52.0)
Hemoglobin: 7.3 g/dL — ABNORMAL LOW (ref 13.0–17.0)
MCH: 28.9 pg (ref 26.0–34.0)
MCHC: 32.4 g/dL (ref 30.0–36.0)
MCV: 88.9 fL (ref 80.0–100.0)
Platelets: 47 K/uL — ABNORMAL LOW (ref 150–400)
RBC: 2.53 MIL/uL — ABNORMAL LOW (ref 4.22–5.81)
RDW: 16.4 % — ABNORMAL HIGH (ref 11.5–15.5)
WBC: 12.6 K/uL — ABNORMAL HIGH (ref 4.0–10.5)
nRBC: 0.5 % — ABNORMAL HIGH (ref 0.0–0.2)

## 2024-04-18 LAB — MAGNESIUM: Magnesium: 2.2 mg/dL (ref 1.7–2.4)

## 2024-04-18 MED ORDER — POTASSIUM CHLORIDE 20 MEQ PO PACK
20.0000 meq | PACK | Freq: Once | ORAL | Status: AC
  Administered 2024-04-18: 20 meq
  Filled 2024-04-18: qty 1

## 2024-04-18 NOTE — Progress Notes (Signed)
 Inpatient Rehab Admissions Coordinator:   Per therapy recommendations pt was screened for CIR by Reche Lowers, PT, DPT.  Note per documentation, patient has a history of IVDU and will require IV antibiotics for an extended period. We are not able to accommodate this patient's needs with a short CIR stay. Patient will likely need to remain in the acute hospital until his IV antibiotics are completed.    Reche Lowers, PT, DPT Admissions Coordinator [phone] 04/18/24 2:00 PM

## 2024-04-18 NOTE — Progress Notes (Signed)
   04/18/24 0830  Therapy Vitals  Temp (!) 100.8 F (38.2 C)  Pulse Rate 95  Resp (!) 21  BP (!) 130/98  MEWS Score/Color  MEWS Score 2  MEWS Score Color Yellow  Oxygen Therapy/Pulse Ox  O2 Device Aerosol Mask (TBAR)  SpO2 96 %  O2 Therapy Oxygen humidified  O2 Flow Rate (L/min) 10 L/min  FiO2 (%) 40 %

## 2024-04-18 NOTE — TOC Progression Note (Signed)
 Transition of Care [facility]) - Progression Note    Patient Details  Name: JAQUIS PICKLESIMER MRN: [id number] Date of Birth: Mar 18, 1976  Transition of Care [facility]) CM/SW Contact  Lendia Dais, [place] Phone Number: 04/18/2024, 4:27 PM  Clinical Narrative:  Pt is not yet medically stable for CIR. Pt will need to finish IV abx before being considered for inpatient rehab.  TOC will continue to follow.                      Expected Discharge Plan and Services                                               Social Drivers of Health (SDOH) Interventions SDOH Screenings   Food Insecurity: No Food Insecurity (03/15/2024)  Housing: Low Risk  (03/15/2024)  Transportation Needs: No Transportation Needs (03/15/2024)  Utilities: Not At Risk (03/15/2024)  Tobacco Use: High Risk (03/14/2024)    Readmission Risk Interventions    04/15/2024    4:16 PM 03/25/2024    9:50 AM 03/22/2024   10:01 AM  Readmission Risk Prevention Plan  Transportation Screening Complete Complete Complete  PCP or Specialist Appt within 5-7 Days Complete    Home Care Screening Complete Complete Complete  Medication Review (RN CM) Referral to Pharmacy Complete Complete

## 2024-04-18 NOTE — Plan of Care (Signed)

## 2024-04-18 NOTE — Progress Notes (Addendum)
 NAME:  [name] [name], MRN:  [id number], DOB:  [birth year], LOS: 4 ADMISSION DATE:  04/14/2024, CONSULTATION DATE:  04/14/24 REFERRING MD:  Suzette - [facility] EM, CHIEF COMPLAINT:   AMS  History of Present Illness:  48 yo M PMH recently admitted with MSSA PNA MSSA bacteremia psoas abscesses and lumbar osteo 03/14/24-03/25/24 (left AMA) who presented to [facility] ED 04/14/24 w AMS. Arrived to ED covered in feces,  altered, with poor airway protection and was intubated CXR with RLL opacities  Confirmed bilateral septic pulmonary emboli, tricuspid valve endocarditis.  Required intubation and mechanical ventilation   Pertinent  Medical History  MSSA bacteremia MSSA PNA Lumbar osteomyelitis  Psoas abscess  Severe protein calorie malnutrition FTT in adult  Significant Hospital Events: Including procedures, antibiotic start and stop dates in addition to other pertinent events   11/2 [facility] ED w AMS. Intubated. Started on vanc mero. Txf request to [place] to [facility]  11/3 extubated, developed respiratory distress requiring reintubation. Brief cardiac arrest during intubation.  11/3 transthoracic echocardiogram > LVEF 40-45%, global hypokinesis, grade 1 diastolic dysfunction, normal RV function.  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  No mitral or aortic valve involvement 11/3 CT chest, abdomen, pelvis >> numerous bilateral cavitary and solid nodules consistent with septic emboli, left lower lobe consolidation with a small pleural effusion, right basilar atelectasis.  Moderate free fluid in the pelvis.  Erosive changes at T5-T6, L2-L3, L4-L5 suggestive of discitis 11/3 CT head >> no acute abnormality.  Opacified left maxillary sinus  Interim History / Subjective:  Precedex 0.9, has been as high as 1.2 Fentanyl  up-and-down between 100-175, currently off I/O+ 7 L total SCr 3.72 > 3.32 > 0.13, BUN 94, sodium 149   Objective    Blood pressure 121/87, pulse 92, temperature (!)  100.8 F (38.2 C), resp. rate 14, height 6' (1.829 m), weight 66.9 kg, SpO2 98%.    Vent Mode: PRVC FiO2 (%):  [40 %] 40 % Set Rate:  [26 bmp] 26 bmp Vt Set:  [620 mL] 620 mL PEEP:  [5 cmH20] 5 cmH20 Plateau Pressure:  [18 cmH20-25 cmH20] 25 cmH20   Intake/Output Summary (Last 24 hours) at 04/18/2024 0844 Last data filed at 04/18/2024 0800 Gross per 24 hour  Intake 2556.33 ml  Output 2325 ml  Net 231.33 ml   Filed Weights   04/16/24 0703 04/17/24 0403 04/18/24 0150  Weight: 64.4 kg 70.7 kg 66.9 kg   Examination:  General: Thin chronically ill-appearing gentleman intubated Neuro: He is awake, follows commands, good strength.  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 Left AMA 10/13, not sure that he was dc w abx successfully but 4wk ancef  was rec -Blood cultures this admission again consistent with MSSA bacteremia -Urine culture 60k colonies staph.  P: -Appreciate ID and thoracic surgery input - On cefazolin , planning for 6 weeks - Consider MRI spine to characterize discitis, possible osteomyelitis once he stabilizes his renal function - No surgical intervention or angio vac recommended by thoracic surgery at this time  Acute hypoxic  respiratory failure Intubated for airway protection in setting of acute metabolic encephalopathy  -suspect in the setting of severe sepsis, but also has history of substance abuse -septic embolization to both lungs. P:  -Will push spontaneous breathing trial and T-piece trial today  11/6.  If he passes then we will extubate again - Push pulmonary hygiene - VAP bundle in place - Sedation as per PAD protocol, currently only on Precedex  Acute Metabolic Encephalopathy AMS  Hx of substance abuse  -Given multifactorial, likely most significant contributor his sepsis, uremia.  Superimposed on substance use P: -Follow mental status, minimize sedating medications as able.  Goal will be to wean Precedex to off quickly once he is extubated  AKI AGMA  -Appears to be stabilizing with hemodynamic support Hyperkalemia P: R -Following serum creatinine, urine output - Hold off on diuretics for now given his hypernatremia.  Suspect we will need to reinitiate as his hemodynamics, renal function, electrolytes allow  Hyperbilirubinemia  Elevated Alk phos  -Likely shock liver P: -No evidence of intra-abdominal abnormality, hepatic abnormality on CT -Follow LFTs  Anemia Thrombocytopenia- plt 32  - anemia stable from prior admission  Suspect thrombocytopenia in setting of severe sepsis.  DIC panel reassuring P: -Following CBC trends - Transfusion goals hemoglobin 7.0, platelets 10  Hx of HTN P: -Continue to hold his home antihypertensive regimen - Following telemetry  Hx of Anxiety  P:  -Home meds on hold.  He uses Xanax , oxycodone  as an outpatient (question illicit) -Can wean Precedex off as above  Stage 2 pressure ulcer sacrum, present on admission - continue wound care as per consultation  Critical care time: 35 minutes    Lamar Chris, MD, PhD 04/18/2024, 8:44 AM [place] [facility] [phone] or if no answer before 7:00PM call [phone] For any issues after 7:00PM please call eLink [phone]

## 2024-04-19 ENCOUNTER — Inpatient Hospital Stay (HOSPITAL_COMMUNITY): Payer: MEDICAID

## 2024-04-19 LAB — BASIC METABOLIC PANEL WITH GFR
Anion gap: 14 (ref 5–15)
BUN: 90 mg/dL — ABNORMAL HIGH (ref 6–20)
CO2: 25 mmol/L (ref 22–32)
Calcium: 7.8 mg/dL — ABNORMAL LOW (ref 8.9–10.3)
Chloride: 115 mmol/L — ABNORMAL HIGH (ref 98–111)
Creatinine, Ser: 2.5 mg/dL — ABNORMAL HIGH (ref 0.61–1.24)
GFR, Estimated: 31 mL/min — ABNORMAL LOW (ref >60)
Glucose, Bld: 126 mg/dL — ABNORMAL HIGH (ref 70–99)
Potassium: 3.8 mmol/L (ref 3.5–5.1)
Sodium: 154 mmol/L — ABNORMAL HIGH (ref 135–145)

## 2024-04-19 LAB — CULTURE, BLOOD (ROUTINE X 2)
Culture: NO GROWTH
Special Requests: ADEQUATE

## 2024-04-19 LAB — CBC
HCT: 23.1 % — ABNORMAL LOW (ref 39.0–52.0)
Hemoglobin: 7.2 g/dL — ABNORMAL LOW (ref 13.0–17.0)
MCH: 29 pg (ref 26.0–34.0)
MCHC: 31.2 g/dL (ref 30.0–36.0)
MCV: 93.1 fL (ref 80.0–100.0)
Platelets: 83 K/uL — ABNORMAL LOW (ref 150–400)
RBC: 2.48 MIL/uL — ABNORMAL LOW (ref 4.22–5.81)
RDW: 17.1 % — ABNORMAL HIGH (ref 11.5–15.5)
WBC: 10.9 K/uL — ABNORMAL HIGH (ref 4.0–10.5)
nRBC: 0.2 % (ref 0.0–0.2)

## 2024-04-19 LAB — MAGNESIUM: Magnesium: 2.3 mg/dL (ref 1.7–2.4)

## 2024-04-19 LAB — GLUCOSE, CAPILLARY
Glucose-Capillary: 128 mg/dL — ABNORMAL HIGH (ref 70–99)
Glucose-Capillary: 125 mg/dL — ABNORMAL HIGH (ref 70–99)
Glucose-Capillary: 142 mg/dL — ABNORMAL HIGH (ref 70–99)
Glucose-Capillary: 132 mg/dL — ABNORMAL HIGH (ref 70–99)
Glucose-Capillary: 138 mg/dL — ABNORMAL HIGH (ref 70–99)

## 2024-04-19 MED ORDER — PROPOFOL 1000 MG/100ML IV EMUL: 0.0000 ug/kg/min | INTRAVENOUS | Status: DC

## 2024-04-19 MED ORDER — PHENYLEPHRINE 80 MCG/ML (10ML) SYRINGE FOR IV PUSH (FOR BLOOD PRESSURE SUPPORT)
PREFILLED_SYRINGE | INTRAVENOUS | Status: DC
Start: 2024-04-19 — End: 2024-04-19
  Filled 2024-04-19: qty 10

## 2024-04-19 MED ORDER — OXYCODONE HCL 5 MG PO TABS
5.0000 mg | ORAL_TABLET | Freq: Four times a day (QID) | ORAL | Status: DC | PRN
  Administered 2024-04-19 – 2024-04-20 (×2): 5 mg
  Filled 2024-04-19 (×3): qty 1

## 2024-04-19 MED ORDER — EPINEPHRINE 1 MG/10ML IV SOSY
PREFILLED_SYRINGE | INTRAVENOUS | Status: DC
Start: 2024-04-19 — End: 2024-04-19
  Filled 2024-04-19: qty 10

## 2024-04-19 MED ORDER — ROCURONIUM BROMIDE 10 MG/ML (PF) SYRINGE
PREFILLED_SYRINGE | INTRAVENOUS | Status: AC
  Administered 2024-04-19: 50 mg via INTRAVENOUS
  Filled 2024-04-19: qty 10

## 2024-04-19 MED ORDER — FOLIC ACID 1 MG PO TABS
1.0000 mg | ORAL_TABLET | Freq: Every day | ORAL | Status: DC
  Administered 2024-04-19 – 2024-04-21 (×3): 1 mg
  Filled 2024-04-19 (×3): qty 1

## 2024-04-19 MED ORDER — MIDAZOLAM HCL (PF) 2 MG/2ML IJ SOLN
2.0000 mg | Freq: Once | INTRAMUSCULAR | Status: AC
  Administered 2024-04-19: 2 mg via INTRAVENOUS

## 2024-04-19 MED ORDER — JUVEN PO PACK
1.0000 | PACK | Freq: Two times a day (BID) | ORAL | Status: DC
  Administered 2024-04-19 – 2024-04-21 (×4): 1
  Filled 2024-04-19 (×4): qty 1

## 2024-04-19 MED ORDER — PROPOFOL 1000 MG/100ML IV EMUL
INTRAVENOUS | Status: AC
  Administered 2024-04-19: 5 ug/kg/min via INTRAVENOUS
  Filled 2024-04-19: qty 100

## 2024-04-19 MED ORDER — ROCURONIUM BROMIDE 10 MG/ML (PF) SYRINGE: 50.0000 mg | PREFILLED_SYRINGE | Freq: Once | INTRAVENOUS | Status: AC

## 2024-04-19 MED ORDER — FREE WATER
400.0000 mL | Status: DC
  Administered 2024-04-19 – 2024-04-21 (×12): 400 mL

## 2024-04-19 MED ORDER — CEFAZOLIN SODIUM-DEXTROSE 2-4 GM/100ML-% IV SOLN
2.0000 g | Freq: Three times a day (TID) | INTRAVENOUS | Status: DC
  Administered 2024-04-19 – 2024-04-24 (×15): 2 g via INTRAVENOUS
  Filled 2024-04-19 (×15): qty 100

## 2024-04-19 NOTE — Procedures (Signed)
 Intubation Procedure Note  [name] [name]  [id number]  Jan 04, 1976  Date:04/19/24  Time:5:30 PM   Provider Performing:Aaleigha Bozza Pleas    Procedure: Intubation (31500)  Indication(s) Respiratory Failure  Consent Unable to obtain consent due to emergent nature of procedure.   Anesthesia Rocuronium and Propofol    Time Out Verified patient identification, verified procedure, site/side was marked, verified correct patient position, special equipment/implants available, medications/allergies/relevant history reviewed, required imaging and test results available.   Sterile Technique Usual hand hygeine, masks, and gloves were used   Procedure Description Patient positioned in bed supine.  Sedation given as noted above.  Patient was intubated with endotracheal tube using Glidescope.  View was Grade 1 full glottis .  Number of attempts was 1.  Colorimetric CO2 detector was consistent with tracheal placement.   Complications/Tolerance None; patient tolerated the procedure well. Chest X-ray is ordered to verify placement.   EBL Minimal   Specimen(s) None

## 2024-04-19 NOTE — Progress Notes (Signed)
 NAME:  [name] [name], MRN:  [id number], DOB:  24-Feb-1976, LOS: 5 ADMISSION DATE:  04/14/2024, CONSULTATION DATE:  04/14/24 REFERRING MD:  [place] - [facility] EM, CHIEF COMPLAINT:   AMS  History of Present Illness:  48 yo M PMH recently admitted with MSSA PNA MSSA bacteremia psoas abscesses and lumbar osteo 03/14/24-03/25/24 (left AMA) who presented to [facility] ED 04/14/24 w AMS. Arrived to ED covered in feces,  altered, with poor airway protection and was intubated CXR with RLL opacities  Confirmed bilateral septic pulmonary emboli, tricuspid valve endocarditis.  Required intubation and mechanical ventilation   Pertinent  Medical History  MSSA bacteremia MSSA PNA Lumbar osteomyelitis  Psoas abscess  Severe protein calorie malnutrition FTT in adult  Significant Hospital Events: Including procedures, antibiotic start and stop dates in addition to other pertinent events   11/2 [facility] ED w AMS. Intubated. Started on vanc mero. Txf request to [place] to [facility]  11/3 extubated, developed respiratory distress requiring reintubation. Brief cardiac arrest during intubation.  11/3 transthoracic echocardiogram > LVEF 40-45%, global hypokinesis, grade 1 diastolic dysfunction, normal RV function.  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  No mitral or aortic valve involvement 11/3 CT chest, abdomen, pelvis >> numerous bilateral cavitary and solid nodules consistent with septic emboli, left lower lobe consolidation with a small pleural effusion, right basilar atelectasis.  Moderate free fluid in the pelvis.  Erosive changes at T5-T6, L2-L3, L4-L5 suggestive of discitis 11/3 CT head >> no acute abnormality.  Opacified left maxillary sinus  Interim History / Subjective:  He tolerated some PSV and also some T-piece yesterday 11/6 Strong cough but with some blood-tinged plugs that did impact oxygenation and work of breathing, therefore extubation was deferred 0.40, PEEP 5 Precedex 0.7,  fentanyl  50 I/O+ 6.7 L total SCr 2.5, Na 154     Objective    Blood pressure (!) 144/102, pulse (!) 56, temperature 98.8 F (37.1 C), resp. rate (!) 21, height 6' (1.829 m), weight 69.8 kg, SpO2 100%.    Vent Mode: PRVC FiO2 (%):  [40 %] 40 % Set Rate:  [26 bmp] 26 bmp Vt Set:  [620 mL] 620 mL PEEP:  [5 cmH20] 5 cmH20 Pressure Support:  [5 cmH20] 5 cmH20 Plateau Pressure:  [18 cmH20-19 cmH20] 18 cmH20   Intake/Output Summary (Last 24 hours) at 04/19/2024 0723 Last data filed at 04/19/2024 0600 Gross per 24 hour  Intake 2108.49 ml  Output 2265 ml  Net -156.51 ml   Filed Weights   04/17/24 0403 04/18/24 0150 04/19/24 0500  Weight: 70.7 kg 66.9 kg 69.8 kg   Examination:  General: Thin ill-appearing man, ventilated Neuro: Wakes to voice, follows commands.  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 Left AMA 10/13, not sure that he was dc w abx successfully but 4wk ancef  was rec -Blood cultures this admission again consistent with MSSA bacteremia -Urine culture 60k colonies staph.  P: -Appreciate ID input, thoracic surgery input - Plan cefazolin  at least 6 weeks - No surgical intervention or angio vac recommended by thoracic surgery at this time - Consider MRI  spine to characterize discitis and osteomyelitis once he stabilizes his renal function  Acute hypoxic respiratory failure Intubated for airway protection in setting of acute metabolic encephalopathy  -suspect in the setting of severe sepsis, but also has history of substance abuse -septic embolization  to both lungs. P:  -Repeat SBT on 11/7, goal extubation - Pulmonary hygiene - VAP bundle in place - Sedation as per PAD protocol, Precedex and fentanyl   Acute Metabolic Encephalopathy AMS  Hx of substance abuse  -Given multifactorial, likely most significant contributor his sepsis, uremia.  Superimposed on substance use P: -Continue to work to correct metabolic abnormalities, treat sepsis.  Hypernatremia now an issue.  Goal will be to wean Precedex off quickly once he is extubated  Acute renal failure, improving but with post ATN auto diuresis AGMA  -Appears to be stabilizing with hemodynamic support Hyperkalemia, resolved Hypernatremia P: R -Following creatinine, urine output.  Improving. - Appears to have a vigorous post ATN auto diuresis with evolving hypernatremia.  Need to increase his free water today 11/7, try to keep up with losses. - Replete electrolytes as indicated  Hyperbilirubinemia  Elevated Alk phos  -Likely shock liver P: -No evidence of intra-abdominal abnormality, hepatic abnormality on CT -Follow intermittent LFT  Anemia Thrombocytopenia- plt 32  - anemia stable from prior admission  Suspect thrombocytopenia in setting of severe sepsis.  DIC panel reassuring P: -Follow CBC trend - Transfusion goals hemoglobin 7.0, platelets 10  Hx of HTN P: -Antihypertensive regimen on hold - Follow telemetry  Hx of Anxiety  P:  -Home meds on hold.  He uses Xanax , oxycodone  as an outpatient (question illicit) -Wean Precedex as able  Stage 2 pressure ulcer sacrum, present on admission -Continue wound care.  Appreciate consultation  Critical care time: 33 minutes    Lamar Chris, MD, PhD 04/19/2024, 7:23 AM [facility] [phone] or if no answer before 7:00PM call [phone] For any issues after 7:00PM please call eLink [phone]

## 2024-04-19 NOTE — Progress Notes (Signed)
 Physical Therapy Treatment Patient Details Name: [name] [name] MRN: [id number] DOB: 03-01-1976 Today's Date: 04/19/2024   History of Present Illness 48 yo M PMH recently admitted with MSSA PNA MSSA bacteremia psoas abscesses and lumbar osteo 03/14/24-03/25/24 (left AMA) who presented to [facility] ED 04/14/24 w AMS. Arrived to ED covered in feces,  altered, with poor airway protection and was intubated    PT Comments  Pt eager to get up with therapy. RN premedicated pt prior to session, but continues to have increased back pain and R LE radiculopathy with upright. Utilized foot egress feature of ICU bed, pt with some discumfort as HoB raised but once in chair position pt able to scoot his hips forwards and relieve some of the pressure on his back. Pt able to power up with modAx2 and maxAx2 for maintaining his balance with B knees blocked. Vitals stable on the vent. Pt continues to have some impulsive movements to pull at NG tube and ET tube but can be quickly redirected. D/c plans remain appropriate. PT will continue to follow acutely.     If plan is discharge home, recommend the following: Assist for transportation;Help with stairs or ramp for entrance;A lot of help with walking and/or transfers;A lot of help with bathing/dressing/bathroom;Assistance with cooking/housework;Direct supervision/assist for medications management;Direct supervision/assist for financial management   Can travel by private vehicle      No  Equipment Recommendations  None recommended by PT       Precautions / Restrictions Precautions Precautions: Fall Recall of Precautions/Restrictions: Intact Precaution/Restrictions Comments: ETT, Cortrak, B wrist restraints Restrictions Weight Bearing Restrictions Per Provider Order: No     Mobility  Bed Mobility Overal bed mobility: Needs Assistance Bed Mobility: Rolling Rolling: Min assist, Used rails         General bed mobility comments: rolled bilaterally for linen  adjustments, foot egress feature of ICU bed utilized today    Transfers Overall transfer level: Needs assistance Equipment used: 2 person hand held assist Transfers: Sit to/from Stand Sit to Stand: Mod assist, +2 physical assistance, +2 safety/equipment           General transfer comment: Stood from bed in foot egress position, B knees blocked, cued for powering up & hand placement          Balance Overall balance assessment: Needs assistance Sitting-balance support: Bilateral upper extremity supported, Single extremity supported, Feet supported Sitting balance-Leahy Scale: Fair Sitting balance - Comments: CGA using bed rails inermittently 2/2 progressive back pain while sitting unsupported with bed in egress position   Standing balance support: Bilateral upper extremity supported, During functional activity Standing balance-Leahy Scale: Poor Standing balance comment: requires max A +2 to maintain stance, cues for forward gaze                            Communication Communication Communication: Impaired Factors Affecting Communication: Trach/intubated (ETT)  Cognition Arousal: Alert Behavior During Therapy: WFL for tasks assessed/performed                             Following commands: Intact      Cueing Cueing Techniques: Verbal cues, Tactile cues     General Comments General comments (skin integrity, edema, etc.): VSS on vent at settings 40% FiO2, PEEP 5      Pertinent Vitals/Pain Pain Assessment Pain Assessment: Faces Faces Pain Scale: Hurts whole lot Pain Location: back, radiculopathy in  RLE Pain Descriptors / Indicators: Discomfort, Guarding, Radiating Pain Intervention(s): Limited activity within patient's tolerance, Monitored during session, Repositioned     PT Goals (current goals can now be found in the care plan section) Acute Rehab PT Goals PT Goal Formulation: With patient Time For Goal Achievement: 05/01/24 Potential to  Achieve Goals: Good Progress towards PT goals: Progressing toward goals    Frequency    Min 3X/week       Co-evaluation PT/OT/SLP Co-Evaluation/Treatment: Yes Reason for Co-Treatment: To address functional/ADL transfers;Complexity of the patient's impairments (multi-system involvement) PT goals addressed during session: Mobility/safety with mobility OT goals addressed during session: ADL's and self-care      AM-PAC PT 6 Clicks Mobility   Outcome Measure  Help needed turning from your back to your side while in a flat bed without using bedrails?: A Little Help needed moving from lying on your back to sitting on the side of a flat bed without using bedrails?: Total Help needed moving to and from a bed to a chair (including a wheelchair)?: Total Help needed standing up from a chair using your arms (e.g., wheelchair or bedside chair)?: Total Help needed to walk in hospital room?: Total Help needed climbing 3-5 steps with a railing? : Total 6 Click Score: 8    End of Session   Activity Tolerance: Patient tolerated treatment well Patient left: in bed;with call bell/phone within reach;with family/visitor present;with restraints reapplied;with SCD's reapplied;with bed alarm set Nurse Communication: Mobility status PT Visit Diagnosis: Unsteadiness on feet (R26.81);Muscle weakness (generalized) (M62.81);Difficulty in walking, not elsewhere classified (R26.2)     Time: 8396-8353 PT Time Calculation (min) (ACUTE ONLY): 43 min  Charges:    $Therapeutic Activity: 23-37 mins PT General Charges $$ ACUTE PT VISIT: 1 Visit                     Deliah Strehlow B. Fleeta Lapidus PT, DPT Acute Rehabilitation Services Please use secure chat or  Call Office [phone]    [name] [name] 04/19/2024, 5:34 PM

## 2024-04-19 NOTE — Progress Notes (Signed)
 Occupational Therapy Treatment Patient Details Name: [name] [name] MRN: [id number] DOB: May 31, 1976 Today's Date: 04/19/2024   History of present illness 48 yo M PMH recently admitted with MSSA PNA MSSA bacteremia psoas abscesses and lumbar osteo 03/14/24-03/25/24 (left AMA) who presented to [facility] ED 04/14/24 w AMS. Arrived to ED covered in feces,  altered, with poor airway protection and was intubated   OT comments  Pt received in supine, eager and agreeable to participate with therapy. RN pre-medicated prior to arrival for optimal performance and pain mgmt. Pt initially tearful after speaking to his wife & mother over the phone, grateful to participate today. Utilized foot egress feature of ICU bed today with pt tolerating well. He was setup for simple grooming bed level and mod A for LB dressing via modified technique. Improved sitting balance unsupported by bed in egress position. He was able to stand with mod A +2 and needed max A +2 to sustain stance, B knees blocked. Stable vitals throughout session on vent at 40% FiO2 / PEEP 5. Pt remains somewhat impulsive, pulling on less lines this date. Pt is progressing well, will continue to follow.      If plan is discharge home, recommend the following:  Two people to help with walking and/or transfers;Two people to help with bathing/dressing/bathroom;Direct supervision/assist for medications management;Direct supervision/assist for financial management;Assist for transportation;Help with stairs or ramp for entrance   Equipment Recommendations  Other (comment) (defer)    Recommendations for Other Services      Precautions / Restrictions Precautions Precautions: Fall Recall of Precautions/Restrictions: Intact Precaution/Restrictions Comments: ETT, Cortrak, B wrist restraints Restrictions Weight Bearing Restrictions Per Provider Order: No       Mobility Bed Mobility Overal bed mobility: Needs Assistance Bed Mobility: Rolling Rolling: Min  assist, Used rails         General bed mobility comments: rolled bilaterally for linen adjustments, foot egress feature of ICU bed utilized today    Transfers Overall transfer level: Needs assistance Equipment used: 2 person hand held assist Transfers: Sit to/from Stand Sit to Stand: Mod assist, +2 physical assistance, +2 safety/equipment           General transfer comment: Stood from bed in foot egress position, B knees blocked, cued for powering up & hand placement     Balance Overall balance assessment: Needs assistance Sitting-balance support: Bilateral upper extremity supported, Single extremity supported, Feet supported Sitting balance-Leahy Scale: Fair Sitting balance - Comments: CGA using bed rails inermittently 2/2 progressive back pain while sitting unsupported with bed in egress position   Standing balance support: Bilateral upper extremity supported, During functional activity Standing balance-Leahy Scale: Poor Standing balance comment: requires max A +2 to maintain stance, cues for forward gaze                           ADL either performed or assessed with clinical judgement   ADL Overall ADL's : Needs assistance/impaired Eating/Feeding: NPO Eating/Feeding Details (indicate cue type and reason): Cortrak Grooming: Set up;Bed level Grooming Details (indicate cue type and reason): attempted to initiate grooming in egress position in bed to challenge upright tolerance/sitting balance, limited by back pain and wishing to completed in bed             Lower Body Dressing: Moderate assistance;Bed level;Cueing for back precautions Lower Body Dressing Details (indicate cue type and reason): assist to don RLE sock 2/2 back pain, hip hike method to don LLE sock  with incr effort     Toileting- Clothing Manipulation and Hygiene: Total assistance Toileting - Clothing Manipulation Details (indicate cue type and reason): foley intact             Extremity/Trunk Assessment Upper Extremity Assessment Upper Extremity Assessment: Generalized weakness            Vision       Perception     Praxis     Communication Communication Communication: Impaired Factors Affecting Communication: Trach/intubated (ETT)   Cognition Arousal: Alert Behavior During Therapy: WFL for tasks assessed/performed Cognition: Cognition impaired   Orientation impairments:  (not formally assessed this date) Awareness: Online awareness impaired   Attention impairment (select first level of impairment): Selective attention Executive functioning impairment (select all impairments): Reasoning OT - Cognition Comments: communicates via head gestures & writing on etch-a-sketch, initially tearful at beginning of session after speaking to wife & mother over the phone with RN assist, appreciative for working with therapy                 Following commands: Intact        Cueing   Cueing Techniques: Verbal cues, Tactile cues  Exercises      Shoulder Instructions       General Comments VSS on vent at settings 40% FiO2, PEEP 5    Pertinent Vitals/ Pain       Pain Assessment Pain Assessment: Faces Faces Pain Scale: Hurts whole lot Pain Location: back, radiculopathy in RLE Pain Descriptors / Indicators: Discomfort, Guarding, Radiating Pain Intervention(s): Premedicated before session, Repositioned, Limited activity within patient's tolerance, Monitored during session  Home Living                                          Prior Functioning/Environment              Frequency  Min 2X/week        Progress Toward Goals  OT Goals(current goals can now be found in the care plan section)  Progress towards OT goals: Progressing toward goals     Plan      Co-evaluation    PT/OT/SLP Co-Evaluation/Treatment: Yes Reason for Co-Treatment: To address functional/ADL transfers;Complexity of the patient's impairments  (multi-system involvement)   OT goals addressed during session: ADL's and self-care      AM-PAC OT 6 Clicks Daily Activity     Outcome Measure   Help from another person eating meals?: Total Help from another person taking care of personal grooming?: A Little Help from another person toileting, which includes using toliet, bedpan, or urinal?: Total Help from another person bathing (including washing, rinsing, drying)?: A Lot Help from another person to put on and taking off regular upper body clothing?: A Little Help from another person to put on and taking off regular lower body clothing?: A Lot 6 Click Score: 12    End of Session Equipment Utilized During Treatment: Oxygen (ETT via vent)  OT Visit Diagnosis: Unsteadiness on feet (R26.81);Muscle weakness (generalized) (M62.81);Pain   Activity Tolerance Patient tolerated treatment well;Patient limited by pain (back pain)   Patient Left in bed;with call bell/phone within reach;with bed alarm set;with restraints reapplied;with SCD's reapplied   Nurse Communication Mobility status        Time: 8440-8356 OT Time Calculation (min): 44 min  Charges: OT General Charges $OT Visit: 1 Visit OT Treatments $Self Care/Home Management :  8-22 mins  Joyleen Haselton D., MSOT, OTR/L Acute Rehabilitation Services [phone] Secure Chat Preferred  Moshe Wenger 04/19/2024, 5:30 PM

## 2024-04-19 NOTE — Progress Notes (Signed)
 Nutrition Follow-up  DOCUMENTATION CODES:  Severe malnutrition in context of social or environmental circumstances  INTERVENTION:  Plan for Cortrak placement today.  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 PMH significant for recent admission (10/2-10/13 leaving AMA) for MSSA, PNA, psoas abscesses and lumbar osteomyelitis, tobacco use, IVDU and alcohol use  11/2: admitted, transferred from [facility] to [facility] 11/3: extubated; respiratory distress; re-intubated, cardiac arrest, ROSC  Patient discussed in interdisciplinary rounds.   Remains intubated on vent support.  Srong cough but with some blood-tinged plugs that did impact oxygenation and work of breathing, therefore extubation deferred.  Now with post ATN auto diuresis with evolving hypernatremia. CCM increased free water flushes.   Patient continues on tube feeds, tolerating at goal rate. Plan to exchange OGT for Cortrak today and continue enteral nutrition support.   Admit weight: 62.5 kg Current weight: 69.8 kg   Drains/lines:  Left internal jugular; CVC triple lumen OGT  UOP: x24 hours I/O's: +7133ml since admit  Medications: colace BID, folvite , MVI, miralax   Labs: Sodium 154 BUN 90 Cr 2.50 GFR 31 CBG's 129-180 x24 hours   Diet Order:   Diet Order              Diet NPO time specified  Diet effective now                   EDUCATION NEEDS:   Not appropriate for education at this time  Skin:  Skin Integrity Issues:: Stage II Stage II: Per WOC: 2 ruptured blisters 2 cm x 1 cm x 0.1 cm and 0.5 cm x 0.5 cm x 0.1 cm.  Last BM:  11/3 type 5 medium  Height:   Ht Readings from Last 1 Encounters:  04/19/24 6' (1.829 m)    Weight:   Wt Readings from Last 1 Encounters:  04/19/24 69.8 kg   BMI:  Body mass index is 20.87 kg/m.  Estimated Nutritional Needs:   Kcal:  2000-2200  Protein:  100-115g  Fluid:  >/=2L  Royce Maris, RDN, LDN Clinical Nutrition See AMiON for contact information.

## 2024-04-19 NOTE — Procedures (Addendum)
 Bronchoscopy Procedure Note  [name] [name]  [id number]  1976/03/20  Date:04/19/24  Time:5:32 PM   Provider Performing:Mycala Warshawsky   Procedure(s):  Flexible Bronchoscopy (68377)  Indication(s) Acute respiratory failure Air leak   Consent Unable to obtain consent due to emergent nature of procedure.  Anesthesia Pt intubated and sedated in icu   Time Out Verified patient identification, verified procedure, site/side was marked, verified correct patient position, special equipment/implants available, medications/allergies/relevant history reviewed, required imaging and test results available.   Sterile Technique Usual hand hygiene, masks, gowns, and gloves were used   Procedure Description Bronchoscope advanced through endotracheal tube and into airway.  Airways were examined down to subsegmental level with findings noted below.   Scattered secretions Mucosal trauma in trachea visible Whitish mucosal coating on central airways  Findings: Scattered secretions Mucosal trauma in trachea visible Whitish mucosal coating on central airways   Complications/Tolerance None; patient tolerated the procedure well. Chest X-ray is needed post procedure.   EBL Minimal   Specimen(s) none

## 2024-04-19 NOTE — Procedures (Signed)
 Cortrak  Person Inserting Tube:  Lorana Maffeo T, RD Tube Type:  Cortrak - 43 inches Tube Size:  10 Tube Location:  Right nare Secured by: Bridle Initial Placement:  Gastric Technique Used to Measure Tube Placement:  Marking at nare/corner of mouth Cortrak Secured At:  70 cm Initial Placement Verification:  Cortrak device (Registered Dieticians Only)    Cortrak Tube Team Note:  Consult received to place a Cortrak feeding tube.   No x-ray is required. RN may begin using tube.   If the tube becomes dislodged please keep the tube and contact the Cortrak team at [email or web address] for replacement.  If after hours and replacement cannot be delayed, place a NG tube and confirm placement with an abdominal x-ray.    Trude Ned RD, LDN Contact via [employer].

## 2024-04-19 NOTE — Plan of Care (Signed)
   Problem: Education: Goal: Knowledge of General Education information will improve Description: Including pain rating scale, medication(s)/side effects and non-pharmacologic comfort measures Outcome: Not Progressing   Problem: Health Behavior/Discharge Planning: Goal: Ability to manage health-related needs will improve Outcome: Not Progressing   Problem: Clinical Measurements: Goal: Ability to maintain clinical measurements within normal limits will improve Outcome: Not Progressing Goal: Will remain free from infection Outcome: Not Progressing Goal: Diagnostic test results will improve Outcome: Not Progressing Goal: Respiratory complications will improve Outcome: Not Progressing Goal: Cardiovascular complication will be avoided Outcome: Not Progressing   Problem: Activity: Goal: Risk for activity intolerance will decrease Outcome: Not Progressing   Problem: Nutrition: Goal: Adequate nutrition will be maintained Outcome: Not Progressing   Problem: Coping: Goal: Level of anxiety will decrease Outcome: Not Progressing   Problem: Elimination: Goal: Will not experience complications related to bowel motility Outcome: Not Progressing Goal: Will not experience complications related to urinary retention Outcome: Not Progressing   Problem: Pain Managment: Goal: General experience of comfort will improve and/or be controlled Outcome: Not Progressing   Problem: Safety: Goal: Ability to remain free from injury will improve Outcome: Not Progressing   Problem: Skin Integrity: Goal: Risk for impaired skin integrity will decrease Outcome: Not Progressing   Problem: Safety: Goal: Non-violent Restraint(s) Outcome: Not Progressing

## 2024-04-20 DIAGNOSIS — R0603 Acute respiratory distress: Secondary | ICD-10-CM

## 2024-04-20 DIAGNOSIS — N189 Chronic kidney disease, unspecified: Secondary | ICD-10-CM

## 2024-04-20 DIAGNOSIS — E870 Hyperosmolality and hypernatremia: Secondary | ICD-10-CM

## 2024-04-20 LAB — BASIC METABOLIC PANEL WITH GFR
Anion gap: 14 (ref 5–15)
BUN: 92 mg/dL — ABNORMAL HIGH (ref 6–20)
CO2: 24 mmol/L (ref 22–32)
Calcium: 7.8 mg/dL — ABNORMAL LOW (ref 8.9–10.3)
Chloride: 114 mmol/L — ABNORMAL HIGH (ref 98–111)
Creatinine, Ser: 2.04 mg/dL — ABNORMAL HIGH (ref 0.61–1.24)
GFR, Estimated: 40 mL/min — ABNORMAL LOW (ref >60)
Glucose, Bld: 131 mg/dL — ABNORMAL HIGH (ref 70–99)
Potassium: 3.5 mmol/L (ref 3.5–5.1)
Sodium: 152 mmol/L — ABNORMAL HIGH (ref 135–145)

## 2024-04-20 LAB — MAGNESIUM: Magnesium: 2 mg/dL (ref 1.7–2.4)

## 2024-04-20 LAB — GLUCOSE, CAPILLARY
Glucose-Capillary: 124 mg/dL — ABNORMAL HIGH (ref 70–99)
Glucose-Capillary: 84 mg/dL (ref 70–99)
Glucose-Capillary: 85 mg/dL (ref 70–99)
Glucose-Capillary: 132 mg/dL — ABNORMAL HIGH (ref 70–99)
Glucose-Capillary: 139 mg/dL — ABNORMAL HIGH (ref 70–99)
Glucose-Capillary: 126 mg/dL — ABNORMAL HIGH (ref 70–99)

## 2024-04-20 LAB — CBC
HCT: 22.7 % — ABNORMAL LOW (ref 39.0–52.0)
Hemoglobin: 7 g/dL — ABNORMAL LOW (ref 13.0–17.0)
MCH: 29.5 pg (ref 26.0–34.0)
MCHC: 30.8 g/dL (ref 30.0–36.0)
MCV: 95.8 fL (ref 80.0–100.0)
Platelets: 108 K/uL — ABNORMAL LOW (ref 150–400)
RBC: 2.37 MIL/uL — ABNORMAL LOW (ref 4.22–5.81)
RDW: 18.4 % — ABNORMAL HIGH (ref 11.5–15.5)
WBC: 10.3 K/uL (ref 4.0–10.5)
nRBC: 0 % (ref 0.0–0.2)

## 2024-04-20 LAB — TRIGLYCERIDES: Triglycerides: 153 mg/dL — ABNORMAL HIGH (ref <150)

## 2024-04-20 LAB — PHOSPHORUS: Phosphorus: 5.3 mg/dL — ABNORMAL HIGH (ref 2.5–4.6)

## 2024-04-20 MED ORDER — POTASSIUM CHLORIDE 20 MEQ PO PACK
40.0000 meq | PACK | Freq: Once | ORAL | Status: AC
  Administered 2024-04-20: 40 meq
  Filled 2024-04-20: qty 2

## 2024-04-20 MED ORDER — HEPARIN SODIUM (PORCINE) 5000 UNIT/ML IJ SOLN
5000.0000 [IU] | Freq: Three times a day (TID) | INTRAMUSCULAR | Status: DC
  Administered 2024-04-20 – 2024-04-30 (×30): 5000 [IU] via SUBCUTANEOUS
  Filled 2024-04-20 (×31): qty 1

## 2024-04-20 NOTE — Plan of Care (Signed)

## 2024-04-20 NOTE — Progress Notes (Addendum)
 NAME:  [name] [name], MRN:  [id number], DOB:  12-Feb-1976, LOS: 6 ADMISSION DATE:  04/14/2024, CONSULTATION DATE:  04/14/24 REFERRING MD:  Suzette - [facility] EM, CHIEF COMPLAINT:   AMS  History of Present Illness:  48 yo M PMH recently admitted with MSSA PNA MSSA bacteremia psoas abscesses and lumbar osteo 03/14/24-03/25/24 (left AMA) who presented to [facility] ED 04/14/24 w AMS. Arrived to ED covered in feces,  altered, with poor airway protection and was intubated CXR with RLL opacities  Confirmed bilateral septic pulmonary emboli, tricuspid valve endocarditis.  Required intubation and mechanical ventilation   Pertinent  Medical History  MSSA bacteremia MSSA PNA Lumbar osteomyelitis  Psoas abscess  Severe protein calorie malnutrition FTT in adult  Significant Hospital Events: Including procedures, antibiotic start and stop dates in addition to other pertinent events   11/2 [facility] ED w AMS. Intubated. Started on vanc mero. Txf request to [place] to [facility]  11/3 extubated, developed respiratory distress requiring reintubation. Brief cardiac arrest during intubation.  11/3 transthoracic echocardiogram > LVEF 40-45%, global hypokinesis, grade 1 diastolic dysfunction, normal RV function.  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  No mitral or aortic valve involvement 11/3 CT chest, abdomen, pelvis >> numerous bilateral cavitary and solid nodules consistent with septic emboli, left lower lobe consolidation with a small pleural effusion, right basilar atelectasis.  Moderate free fluid in the pelvis.  Erosive changes at T5-T6, L2-L3, L4-L5 suggestive of discitis 11/3 CT head >> no acute abnormality.  Opacified left maxillary sinus  Interim History / Subjective:  ET tube exchanged on 11/7 for ET tube blockage, was also bronched This a.m. mentation improved, on minimal vent settings, but has a lot of secretion, will continue with SBT and reassess tomorrow for extubation Went up  on free water to 500 every 4 for still high sodium Started DVT prophylaxis  Objective    Blood pressure 130/89, pulse (!) 52, temperature 98.3 F (36.8 C), temperature source Oral, resp. rate (!) 26, height 6' (1.829 m), weight 68.6 kg, SpO2 100%.    Vent Mode: PSV;CPAP FiO2 (%):  [40 %-50 %] 40 % Set Rate:  [26 bmp] 26 bmp Vt Set:  [620 mL] 620 mL PEEP:  [5 cmH20] 5 cmH20 Pressure Support:  [10 cmH20] 10 cmH20 Plateau Pressure:  [16 cmH20-17 cmH20] 17 cmH20   Intake/Output Summary (Last 24 hours) at 04/20/2024 0945 Last data filed at 04/20/2024 0700 Gross per 24 hour  Intake 4060.36 ml  Output 1685 ml  Net 2375.36 ml   Filed Weights   04/18/24 0150 04/19/24 0500 04/20/24 0436  Weight: 66.9 kg 69.8 kg 68.6 kg   Examination:  General: Thin ill-appearing man, on minimal vent settings  neuro: Following commands Strong cough CV: Distant, no murmur PULM: Coarse bilaterally, no wheeze, still increased secretion GI: Distended with positive bowel sounds, no pain on palpation Extremities: No edema  Resolved problem list   Assessment and Plan   Severe sepsis  Lactic acidosis  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 Left AMA 10/13, not sure that he was dc w abx successfully but 4wk ancef  was rec -Blood cultures this admission again consistent with MSSA bacteremia -Urine culture 60k colonies staph.  P: -Appreciate ID input, thoracic surgery input - Plan cefazolin  at least 6 weeks - No surgical intervention or angio vac  recommended by thoracic surgery at this time - Consider MRI spine to characterize discitis and osteomyelitis once he stabilizes his renal function  Acute hypoxic respiratory failure Intubated for airway protection in setting of acute metabolic encephalopathy  -suspect in the setting  of severe sepsis, but also has history of substance abuse -septic embolization to both lungs. P:  -Repeat SBT on 11/7, goal extubation - Pulmonary hygiene - VAP bundle in place - Sedation as per PAD protocol, Precedex and fentanyl   Acute Metabolic Encephalopathy AMS  Hx of substance abuse  -Given multifactorial, likely most significant contributor his sepsis, uremia.  Superimposed on substance use P: -Continue to work to correct metabolic abnormalities, treat sepsis.  Hypernatremia now an issue.  Goal will be to wean Precedex off quickly once he is extubated  Acute renal failure, improving but with post ATN auto diuresis AGMA  -Appears to be stabilizing with hemodynamic support Hyperkalemia, resolved Hypernatremia P: R -Following creatinine, urine output.  Improving. - Appears to have a vigorous post ATN auto diuresis with evolving hypernatremia.  Need to increase his free water today 11/7, try to keep up with losses. - Replete electrolytes as indicated  Hyperbilirubinemia  Elevated Alk phos  -Likely shock liver P: -No evidence of intra-abdominal abnormality, hepatic abnormality on CT -Follow intermittent LFT  Anemia Thrombocytopenia- plt 32  - anemia stable from prior admission  Suspect thrombocytopenia in setting of severe sepsis.  DIC panel reassuring P: -Follow CBC trend - Transfusion goals hemoglobin 7.0, platelets 10  Hx of HTN P: -Antihypertensive regimen on hold - Follow telemetry  Hx of Anxiety  P:  -Home meds on hold.  He uses Xanax , oxycodone  as an outpatient (question illicit) -Wean Precedex as able  Stage 2 pressure ulcer sacrum, present on admission -Continue wound care.  Appreciate consultation   -Patient still has a lot of secretion, will reassess tomorrow for extubation - Will go up on free water to 500 every 4 for high sodium, if still refractory we will do further workup - Started DVT prophylaxis  Critical care time: 33 minutes    Lenny Drought, MD  [facility] Prefer epic messenger for cross cover needs   Critical care time was exclusive of separately billable procedures and treating other patients.  Critical care was necessary to treat or prevent imminent or life-threatening deterioration.  Critical care was time spent personally by me on the following activities: development of treatment plan with patient and/or surrogate as well as nursing, discussions with consultants, evaluation of patient's response to treatment, examination of patient, obtaining history from patient or surrogate, ordering and performing treatments and interventions, ordering and review of laboratory studies, ordering and review of radiographic studies, pulse oximetry, re-evaluation of patient's condition and participation in multidisciplinary rounds.

## 2024-04-20 NOTE — Progress Notes (Signed)
 [facility]    Date of Admission:  04/14/2024   Total days of antibiotics 6   ID: [name] [name] is a 48 y.o. male with  disseminated MSSA infection Principal Problem:   Sepsis (HCC) Active Problems:   Severe sepsis (HCC)    Subjective: Afebrile, remains intubated. Easily awakens to verbal stimuli. Denies pain   Medications:   Chlorhexidine Gluconate Cloth  6 each Topical Daily   docusate  100 mg Per Tube BID   folic acid   1 mg Per Tube Daily   free water  400 mL Per Tube Q4H   heparin  injection (subcutaneous)  5,000 Units Subcutaneous Q8H   multivitamin with minerals  1 tablet Per Tube Daily   nutrition supplement (JUVEN)  1 packet Per Tube BID BM   mouth rinse  15 mL Mouth Rinse Q2H   pantoprazole (PROTONIX) IV  40 mg Intravenous QHS   polyethylene glycol  17 g Per Tube Daily    Objective: Vital signs in last 24 hours: Temp:  [97.4 F (36.3 C)-99.8 F (37.7 C)] 98.7 F (37.1 C) (11/08 1512) Pulse Rate:  [47-95] 91 (11/08 1700) Resp:  [11-27] 23 (11/08 1700) BP: (107-151)/(72-94) 134/93 (11/08 1700) SpO2:  [86 %-100 %] 100 % (11/08 1700) FiO2 (%):  [40 %] 40 % (11/08 1529) Weight:  [68.6 kg] 68.6 kg (11/08 0436)  Physical Exam  Constitutional: He is oriented to person, only. He appears well-developed and under-nourished. No distress.  HENT:  Mouth/Throat: OETT in place Cardiovascular: Normal rate, regular rhythm and normal heart sounds. Exam reveals no gallop and no friction rub.  No murmur heard.  Pulmonary/Chest: Effort normal and breath sounds normal. No respiratory distress. He has no wheezes.  Abdominal: Soft. Bowel sounds are normal. He exhibits no distension. There is no tenderness.  Neurological: He is alert and oriented to person, place, Skin: Skin is warm and dry. No rash noted. No erythema.  Psychiatric: He has a normal mood and affect. His behavior is normal.    Lab Results Recent Labs    04/19/24 0507  04/20/24 0511  WBC 10.9* 10.3  HGB 7.2* 7.0*  HCT 23.1* 22.7*  NA 154* 152*  K 3.8 3.5  CL 115* 114*  CO2 25 24  BUN 90* 92*  CREATININE 2.50* 2.04*   Liver Panel No results for input(s): PROT, ALBUMIN, AST, ALT, ALKPHOS, BILITOT, BILIDIR, IBILI in the last 72 hours. Sedimentation Rate No results for input(s): ESRSEDRATE in the last 72 hours. C-Reactive Protein No results for input(s): CRP in the last 72 hours.  Microbiology: reviewed Studies/Results: DG Abd Portable 1V Result Date: 04/19/2024 EXAM: 1 VIEW XRAY OF THE ABDOMEN 04/19/2024 10:15:00 AM COMPARISON: None available. CLINICAL HISTORY: Encounter for orogastric (OG) tube placement FINDINGS: LINES, TUBES AND DEVICES: Enteric tube in place with tip and side port projecting over the stomach. Overlying defibrillator pads noted. BOWEL: Nonobstructive bowel gas pattern. SOFT TISSUES: No opaque urinary calculi. BONES: No acute osseous abnormality. LUNGS: Patchy airspace opacities in the left lung base. IMPRESSION: 1. Enteric tube in appropriate position with tip and side port projecting over the stomach. 2. Patchy airspace opacities in the left lung base, which may reflect aspiration or pneumonia depending on clinical context. Electronically signed by: Norleen Boxer MD 04/19/2024 10:50 AM EST RP Workstation: [id number]   DG Chest Port 1 View Result Date: 04/19/2024 EXAM: 1 VIEW(S) XRAY OF THE CHEST 04/19/2024 10:13:00 AM COMPARISON: 04/18/2024 CLINICAL HISTORY: Encounter for intubation [id number];  [id number] Encounter for orogastric (OG) tube placement [id number] FINDINGS: LINES, TUBES AND DEVICES: Stable support apparatus. Endotracheal tube in place with tip 3.4 cm above carina. Enteric catheter in place, passes below diaphragm. Left internal jugular catheter in place with tip overlying Superior Vena Cava. External defibrillator pads noted on left chest. LUNGS AND PLEURA: Bilateral airspace disease, left greater than right,  unchanged. Persistent bilateral airspace disease left greater than right. Trace left pleural effusion. No pulmonary edema. No pneumothorax. HEART AND MEDIASTINUM: No acute abnormality of the cardiac and mediastinal silhouettes. BONES AND SOFT TISSUES: No acute osseous abnormality. IMPRESSION: 1. Persistent bilateral airspace disease, left greater than right, unchanged. 2. Trace left pleural effusion. 3. Stable support apparatus. Electronically signed by: Norleen Boxer MD 04/19/2024 10:39 AM EST RP Workstation: [id number]     Assessment/Plan: Disseminated MSSA infection with lumbar osteomyelitis, septic pulmonary emboli, TV endocarditis = plan for 6-8 wk of cefazolin   As creatinine continues to improve, consider to doing MRI of lumbar/thoracic spine early next week.  Aki on ck= has improved but still above baseline; continue to adjust cefazolin  dosing per renal improvement  Hypernatremia= primary team adjust free water intake to help slowly improve hypernatremia  Respiratory distress on vent = continue on vent wean  evaluation of this patient requires complex antimicrobial therapy evaluation and counseling and isolation needs for disease transmission risk assessment and mitigation.    [facility] Pager: [phone]  04/20/2024, 6:33 PM

## 2024-04-21 LAB — CBC
HCT: 24.6 % — ABNORMAL LOW (ref 39.0–52.0)
Hemoglobin: 7.2 g/dL — ABNORMAL LOW (ref 13.0–17.0)
MCH: 29.1 pg (ref 26.0–34.0)
MCHC: 29.3 g/dL — ABNORMAL LOW (ref 30.0–36.0)
MCV: 99.6 fL (ref 80.0–100.0)
Platelets: 135 K/uL — ABNORMAL LOW (ref 150–400)
RBC: 2.47 MIL/uL — ABNORMAL LOW (ref 4.22–5.81)
RDW: 18 % — ABNORMAL HIGH (ref 11.5–15.5)
WBC: 12.5 K/uL — ABNORMAL HIGH (ref 4.0–10.5)
nRBC: 0 % (ref 0.0–0.2)

## 2024-04-21 LAB — CULTURE, BLOOD (ROUTINE X 2)
Culture: NO GROWTH
Culture: NO GROWTH

## 2024-04-21 LAB — GLUCOSE, CAPILLARY
Glucose-Capillary: 110 mg/dL — ABNORMAL HIGH (ref 70–99)
Glucose-Capillary: 83 mg/dL (ref 70–99)
Glucose-Capillary: 126 mg/dL — ABNORMAL HIGH (ref 70–99)
Glucose-Capillary: 118 mg/dL — ABNORMAL HIGH (ref 70–99)
Glucose-Capillary: 264 mg/dL — ABNORMAL HIGH (ref 70–99)
Glucose-Capillary: 107 mg/dL — ABNORMAL HIGH (ref 70–99)

## 2024-04-21 LAB — RENAL FUNCTION PANEL
Albumin: 1.7 g/dL — ABNORMAL LOW (ref 3.5–5.0)
Anion gap: 10 (ref 5–15)
BUN: 88 mg/dL — ABNORMAL HIGH (ref 6–20)
CO2: 26 mmol/L (ref 22–32)
Calcium: 7.8 mg/dL — ABNORMAL LOW (ref 8.9–10.3)
Chloride: 115 mmol/L — ABNORMAL HIGH (ref 98–111)
Creatinine, Ser: 1.6 mg/dL — ABNORMAL HIGH (ref 0.61–1.24)
GFR, Estimated: 53 mL/min — ABNORMAL LOW (ref >60)
Glucose, Bld: 133 mg/dL — ABNORMAL HIGH (ref 70–99)
Phosphorus: 4.2 mg/dL (ref 2.5–4.6)
Potassium: 3.9 mmol/L (ref 3.5–5.1)
Sodium: 151 mmol/L — ABNORMAL HIGH (ref 135–145)

## 2024-04-21 MED ORDER — ORAL CARE MOUTH RINSE
15.0000 mL | OROMUCOSAL | Status: DC
  Administered 2024-04-21: 15 mL via OROMUCOSAL

## 2024-04-21 MED ORDER — PANTOPRAZOLE SODIUM 40 MG PO TBEC
40.0000 mg | DELAYED_RELEASE_TABLET | Freq: Every day | ORAL | Status: DC
  Administered 2024-04-21: 40 mg via ORAL
  Filled 2024-04-21 (×2): qty 1

## 2024-04-21 MED ORDER — INSULIN ASPART 100 UNIT/ML IJ SOLN: 2.0000 [IU] | INTRAMUSCULAR | Status: DC

## 2024-04-21 MED ORDER — FOLIC ACID 1 MG PO TABS
1.0000 mg | ORAL_TABLET | Freq: Every day | ORAL | Status: DC
  Administered 2024-04-22 – 2024-04-27 (×6): 1 mg via ORAL
  Filled 2024-04-21 (×6): qty 1

## 2024-04-21 MED ORDER — DOCUSATE SODIUM 50 MG/5ML PO LIQD
100.0000 mg | Freq: Two times a day (BID) | ORAL | Status: DC | PRN
  Administered 2024-04-27: 100 mg via ORAL
  Filled 2024-04-21 (×2): qty 10

## 2024-04-21 MED ORDER — OXYCODONE HCL 5 MG PO TABS
10.0000 mg | ORAL_TABLET | ORAL | Status: DC | PRN
  Administered 2024-04-21 – 2024-04-27 (×11): 10 mg via ORAL
  Filled 2024-04-21 (×13): qty 2

## 2024-04-21 MED ORDER — ORAL CARE MOUTH RINSE: 15.0000 mL | OROMUCOSAL | Status: DC | PRN

## 2024-04-21 MED ORDER — OXYCODONE HCL 5 MG PO TABS
5.0000 mg | ORAL_TABLET | Freq: Four times a day (QID) | ORAL | Status: DC
  Administered 2024-04-21 – 2024-04-27 (×21): 5 mg via ORAL
  Filled 2024-04-21 (×22): qty 1

## 2024-04-21 MED ORDER — INSULIN ASPART 100 UNIT/ML IJ SOLN: 2.0000 [IU] | Freq: Three times a day (TID) | INTRAMUSCULAR | Status: DC

## 2024-04-21 MED ORDER — POLYETHYLENE GLYCOL 3350 17 G PO PACK
17.0000 g | PACK | Freq: Every day | ORAL | Status: DC | PRN
  Administered 2024-04-25: 17 g via ORAL
  Filled 2024-04-21: qty 1

## 2024-04-21 MED ORDER — OXYCODONE HCL 5 MG PO TABS
5.0000 mg | ORAL_TABLET | Freq: Four times a day (QID) | ORAL | Status: DC
  Administered 2024-04-21: 5 mg
  Filled 2024-04-21: qty 1

## 2024-04-21 MED ORDER — HYDROMORPHONE HCL 1 MG/ML IJ SOLN
1.0000 mg | INTRAMUSCULAR | Status: DC | PRN
  Administered 2024-04-21 – 2024-04-27 (×17): 1 mg via INTRAVENOUS
  Filled 2024-04-21 (×19): qty 1

## 2024-04-21 MED ORDER — ADULT MULTIVITAMIN W/MINERALS CH
1.0000 | ORAL_TABLET | Freq: Every day | ORAL | Status: DC
  Administered 2024-04-22 – 2024-04-27 (×6): 1 via ORAL
  Filled 2024-04-21 (×6): qty 1

## 2024-04-21 MED ORDER — METHOCARBAMOL 750 MG PO TABS
750.0000 mg | ORAL_TABLET | Freq: Three times a day (TID) | ORAL | Status: DC | PRN
Start: 2024-04-21 — End: 2024-04-25
  Administered 2024-04-21 – 2024-04-23 (×4): 750 mg via ORAL
  Filled 2024-04-21 (×2): qty 2
  Filled 2024-04-21: qty 1
  Filled 2024-04-21: qty 2

## 2024-04-21 MED ORDER — INSULIN ASPART 100 UNIT/ML IJ SOLN
2.0000 [IU] | Freq: Three times a day (TID) | INTRAMUSCULAR | Status: DC
  Administered 2024-04-22: 4 [IU] via SUBCUTANEOUS
  Administered 2024-04-22 – 2024-04-24 (×4): 2 [IU] via SUBCUTANEOUS
  Filled 2024-04-21: qty 2
  Filled 2024-04-21: qty 6
  Filled 2024-04-21 (×3): qty 2
  Filled 2024-04-21: qty 4

## 2024-04-21 NOTE — Progress Notes (Signed)
 NAME:  [name] [name], MRN:  [id number], DOB:  20-Jul-1975, LOS: 7 ADMISSION DATE:  04/14/2024, CONSULTATION DATE:  04/14/24 REFERRING MD:  Suzette - [facility] EM, CHIEF COMPLAINT:   AMS  History of Present Illness:  48 yo M PMH recently admitted with MSSA PNA MSSA bacteremia psoas abscesses and lumbar osteo 03/14/24-03/25/24 (left AMA) who presented to [facility] ED 04/14/24 w AMS. Arrived to ED covered in feces,  altered, with poor airway protection and was intubated CXR with RLL opacities  Confirmed bilateral septic pulmonary emboli, tricuspid valve endocarditis.  Required intubation and mechanical ventilation   Pertinent  Medical History  MSSA bacteremia MSSA PNA Lumbar osteomyelitis  Psoas abscess  Severe protein calorie malnutrition FTT in adult  Significant Hospital Events: Including procedures, antibiotic start and stop dates in addition to other pertinent events   11/2 [facility] ED w AMS. Intubated. Started on vanc mero. Txf request to [place] to [facility]  11/3 extubated, developed respiratory distress requiring reintubation. Brief cardiac arrest during intubation.  11/3 transthoracic echocardiogram > LVEF 40-45%, global hypokinesis, grade 1 diastolic dysfunction, normal RV function.  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  No mitral or aortic valve involvement 11/3 CT chest, abdomen, pelvis >> numerous bilateral cavitary and solid nodules consistent with septic emboli, left lower lobe consolidation with a small pleural effusion, right basilar atelectasis.  Moderate free fluid in the pelvis.  Erosive changes at T5-T6, L2-L3, L4-L5 suggestive of discitis 11/3 CT head >> no acute abnormality.  Opacified left maxillary sinus  Interim History / Subjective:  Extubated this morning on 11/9, mentating well, conversational, tolerating diet Has scheduled Oxys with PRNs for pain and withdrawal On DVT prophylaxis  Objective    Blood pressure (!) 127/94, pulse (!) 51,  temperature 98.8 F (37.1 C), temperature source Oral, resp. rate (!) 26, height 6' (1.829 m), weight 69 kg, SpO2 100%.    Vent Mode: PRVC FiO2 (%):  [40 %] 40 % Set Rate:  [26 bmp] 26 bmp Vt Set:  [620 mL] 620 mL PEEP:  [5 cmH20] 5 cmH20 Pressure Support:  [5 cmH20] 5 cmH20 Plateau Pressure:  [19 cmH20-22 cmH20] 22 cmH20   Intake/Output Summary (Last 24 hours) at 04/21/2024 0740 Last data filed at 04/21/2024 0734 Gross per 24 hour  Intake 4689.73 ml  Output 2780 ml  Net 1909.73 ml   Filed Weights   04/19/24 0500 04/20/24 0436 04/21/24 0500  Weight: 69.8 kg 68.6 kg 69 kg   Examination:  General: Thin ill-appearing man, extubated sats well on nasal cannula neuro: Following commands Strong cough CV: Distant, no murmur PULM: Coarse bilaterally, no wheeze, still increased secretion GI: Distended with positive bowel sounds, no pain on palpation Extremities: No edema  Resolved problem list   Assessment and Plan   Severe sepsis  Lactic acidosis  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 Left AMA 10/13, not sure that he was dc w abx successfully but 4wk ancef  was rec -Blood cultures this admission again consistent with MSSA bacteremia -Urine culture 60k colonies staph.  P: -Appreciate ID input, thoracic surgery input - Plan cefazolin  at least 6 weeks - No surgical intervention or angio vac recommended by thoracic surgery at this time - Consider MRI spine to characterize discitis and osteomyelitis once he stabilizes his renal function  Acute hypoxic respiratory failure-extubated on  11/9  -suspect in the setting of severe sepsis, but also has history of substance abuse -septic embolization to both lungs. P:  -Repeat SBT on 11/7, goal extubation - Pulmonary hygiene - Sedation as per PAD protocol,  Precedex and fentanyl   Acute Metabolic Encephalopathy-improving AMS  Hx of substance abuse  -Given multifactorial, likely most significant contributor his sepsis, uremia.  Superimposed on substance use P: -Continue to work to correct metabolic abnormalities, treat sepsis.  Hypernatremia now an issue.  Goal will be to wean Precedex off quickly once he is extubated  Acute renal failure, improving but with post ATN auto diuresis AGMA  -Appears to be stabilizing with hemodynamic support Hyperkalemia, resolved Hypernatremia P: R -Following creatinine, urine output.  Improving. - Appears to have a vigorous post ATN auto diuresis with evolving hypernatremia.  Need to increase his free water today 11/7, try to keep up with losses. - Replete electrolytes as indicated  Hyperbilirubinemia  Elevated Alk phos  -Likely shock liver P: -No evidence of intra-abdominal abnormality, hepatic abnormality on CT -Follow intermittent LFT  Anemia Thrombocytopenia- plt 32  - anemia stable from prior admission  Suspect thrombocytopenia in setting of severe sepsis.  DIC panel reassuring P: -Follow CBC trend - Transfusion goals hemoglobin 7.0, platelets 10  Hx of HTN P: -Antihypertensive regimen on hold - Follow telemetry  Hx of Anxiety  P:  -Home meds on hold.  He uses Xanax , oxycodone  as an outpatient (question illicit) -Wean Precedex as able  Stage 2 pressure ulcer sacrum, present on admission -Continue wound care.  Appreciate consultation     Critical care time: 35 minutes   Lenny Drought, MD  [facility] Prefer epic messenger for cross cover needs   Critical care time was exclusive of separately billable procedures and treating other patients.  Critical care was necessary to treat or prevent imminent or life-threatening deterioration.  Critical care was time spent personally by me on the following activities: development of treatment plan with patient  and/or surrogate as well as nursing, discussions with consultants, evaluation of patient's response to treatment, examination of patient, obtaining history from patient or surrogate, ordering and performing treatments and interventions, ordering and review of laboratory studies, ordering and review of radiographic studies, pulse oximetry, re-evaluation of patient's condition and participation in multidisciplinary rounds.

## 2024-04-21 NOTE — Plan of Care (Signed)

## 2024-04-21 NOTE — Procedures (Addendum)
 Extubation Procedure Note  Patient Details:   Name: [name] [name] DOB: April 28, 1976 MRN: [id number]   Airway Documentation:    Vent end date: 04/21/24 Vent end time: 0919   Evaluation  O2 sats: stable throughout Complications: No apparent complications Patient did tolerate procedure well. Bilateral Breath Sounds: Clear, Diminished   Yes  Patient extubated per order to 6L [place] with no apparent complications. Positive cuff leak was noted prior to extubation. Patient is alert and oriented, is able to speak, and has strong, productive cough. RT and RN were unable to get good pleth on pulse oximetry due to PT shaking and cold. Multiple sites tried with 91% finally being obtained. Will try to warm PT up and get better reading. Vitals stable. RT will continue to monitor.   Nathaniel Wakeley CHRISTELLA Anon 04/21/2024, 9:48 AM

## 2024-04-21 NOTE — Progress Notes (Signed)
 eLink Physician-Brief Progress Note Patient Name: [name] [name] DOB: [birth year] MRN: [id number]   Date of Service  04/21/2024  HPI/Events of Note  Notified of hyperglycemia with glucose at 264.  Pt was extubated today and diet advanced to regular diet.  Last glucose at 264.   eICU Interventions  Insulin sliding scale ordered ACHS and 0200.      Intervention Category Intermediate Interventions: Hyperglycemia - evaluation and treatment  Atiyana Welte 04/21/2024, 8:08 PM

## 2024-04-22 LAB — RENAL FUNCTION PANEL
Albumin: 1.6 g/dL — ABNORMAL LOW (ref 3.5–5.0)
Anion gap: 11 (ref 5–15)
BUN: 73 mg/dL — ABNORMAL HIGH (ref 6–20)
CO2: 24 mmol/L (ref 22–32)
Calcium: 7.9 mg/dL — ABNORMAL LOW (ref 8.9–10.3)
Chloride: 109 mmol/L (ref 98–111)
Creatinine, Ser: 1.45 mg/dL — ABNORMAL HIGH (ref 0.61–1.24)
GFR, Estimated: >60 mL/min (ref >60)
Glucose, Bld: 105 mg/dL — ABNORMAL HIGH (ref 70–99)
Phosphorus: 3.9 mg/dL (ref 2.5–4.6)
Potassium: 3.6 mmol/L (ref 3.5–5.1)
Sodium: 144 mmol/L (ref 135–145)

## 2024-04-22 LAB — GLUCOSE, CAPILLARY
Glucose-Capillary: 159 mg/dL — ABNORMAL HIGH (ref 70–99)
Glucose-Capillary: 70 mg/dL (ref 70–99)
Glucose-Capillary: 108 mg/dL — ABNORMAL HIGH (ref 70–99)
Glucose-Capillary: 126 mg/dL — ABNORMAL HIGH (ref 70–99)

## 2024-04-22 LAB — CBC
HCT: 25.1 % — ABNORMAL LOW (ref 39.0–52.0)
Hemoglobin: 7.5 g/dL — ABNORMAL LOW (ref 13.0–17.0)
MCH: 29.4 pg (ref 26.0–34.0)
MCHC: 29.9 g/dL — ABNORMAL LOW (ref 30.0–36.0)
MCV: 98.4 fL (ref 80.0–100.0)
Platelets: 194 K/uL (ref 150–400)
RBC: 2.55 MIL/uL — ABNORMAL LOW (ref 4.22–5.81)
RDW: 18 % — ABNORMAL HIGH (ref 11.5–15.5)
WBC: 15.5 K/uL — ABNORMAL HIGH (ref 4.0–10.5)
nRBC: 0 % (ref 0.0–0.2)

## 2024-04-22 MED ORDER — CLONIDINE HCL 0.1 MG PO TABS
0.1000 mg | ORAL_TABLET | Freq: Three times a day (TID) | ORAL | Status: DC
  Administered 2024-04-22 – 2024-04-27 (×15): 0.1 mg via ORAL
  Filled 2024-04-22 (×16): qty 1

## 2024-04-22 MED ORDER — ALPRAZOLAM 0.5 MG PO TABS
1.0000 mg | ORAL_TABLET | Freq: Three times a day (TID) | ORAL | Status: DC
  Administered 2024-04-22 – 2024-04-26 (×15): 1 mg via ORAL
  Filled 2024-04-22 (×16): qty 2

## 2024-04-22 MED ORDER — GUAIFENESIN 100 MG/5ML PO LIQD
5.0000 mL | ORAL | Status: DC | PRN
  Administered 2024-04-24: 5 mL via ORAL
  Filled 2024-04-22: qty 10
  Filled 2024-04-22: qty 15

## 2024-04-22 MED ORDER — POTASSIUM CHLORIDE CRYS ER 20 MEQ PO TBCR
60.0000 meq | EXTENDED_RELEASE_TABLET | Freq: Once | ORAL | Status: AC
  Administered 2024-04-22: 60 meq via ORAL
  Filled 2024-04-22: qty 3

## 2024-04-22 MED ORDER — ENSURE PLUS HIGH PROTEIN PO LIQD
237.0000 mL | Freq: Two times a day (BID) | ORAL | Status: DC
  Administered 2024-04-22 – 2024-04-27 (×6): 237 mL via ORAL

## 2024-04-22 MED ORDER — JUVEN PO PACK
1.0000 | PACK | Freq: Two times a day (BID) | ORAL | Status: DC
  Administered 2024-04-22 – 2024-04-27 (×9): 1 via ORAL
  Filled 2024-04-22 (×9): qty 1

## 2024-04-22 MED ORDER — ACETAMINOPHEN 325 MG PO TABS
650.0000 mg | ORAL_TABLET | Freq: Four times a day (QID) | ORAL | Status: DC | PRN
  Administered 2024-04-23 – 2024-05-18 (×18): 650 mg via ORAL
  Filled 2024-04-22 (×20): qty 2

## 2024-04-22 MED ORDER — FUROSEMIDE 10 MG/ML IJ SOLN
40.0000 mg | Freq: Once | INTRAMUSCULAR | Status: AC
  Administered 2024-04-22: 40 mg via INTRAVENOUS
  Filled 2024-04-22: qty 4

## 2024-04-22 NOTE — Progress Notes (Signed)
 Patient has a gnarly cough and temp is 100.4, but doesn't have anything for fever or cough. LIP informed.

## 2024-04-22 NOTE — Progress Notes (Addendum)
 Occupational Therapy Treatment Patient Details Name: [name] [name] MRN: [id number] DOB: 1975/09/30 Today's Date: 04/22/2024   History of present illness 47 yo M recently admitted with MSSA PNA MSSA bacteremia psoas abscesses and lumbar osteo 03/14/24-03/25/24 (left AMA) who presented to [facility] ED 04/14/24 w AMS. Admitted with sepsis and transferred to [facility].  Arrived to ED covered in feces,  altered, with poor airway protection and was intubated, extubated on 11/9. PMH includes: anxiety, HTN   OT comments  Patient seen in conjunction with PT in order to progress with OOB activity. Patient now extubated, oriented, with occasional moments of impulsivity. Patient continues to complain of back pain, and discomfort from minor scrotal swelling. Patient mod A of 2 for bed mobility and requiring increased cues to process log roll technique. Patient mod A of 2 to come into standing, able to ambulate a few feet, but requiring seated rest break. Patient placed on 2L at end of session, however pleth inaccurate despite multiple attempts. OT recommendation remains appropriate.   BP in supine: 135/98 (110)      If plan is discharge home, recommend the following:  Two people to help with walking and/or transfers;Two people to help with bathing/dressing/bathroom;Direct supervision/assist for medications management;Direct supervision/assist for financial management;Assist for transportation;Help with stairs or ramp for entrance   Equipment Recommendations  Other (comment) (defer)    Recommendations for Other Services      Precautions / Restrictions Precautions Precautions: Fall Recall of Precautions/Restrictions: Intact Precaution/Restrictions Comments: ETT, Cortrak, B wrist restraints Restrictions Weight Bearing Restrictions Per Provider Order: No       Mobility Bed Mobility Overal bed mobility: Needs Assistance Bed Mobility: Rolling Rolling: Used rails, Mod assist Sidelying to sit: Mod assist, +2  for physical assistance, +2 for safety/equipment       General bed mobility comments: mod A to roll, mod A of 2 to come into sitting, increased time and effort to sequence appropriately    Transfers Overall transfer level: Needs assistance Equipment used: Rolling walker (2 wheels) Transfers: Sit to/from Stand Sit to Stand: Mod assist, +2 physical assistance, +2 safety/equipment           General transfer comment: Mod A of 2 to come into standing, increased tremulous movement noted in BUEs, able to ambulate a few feeet before BLE buckling and need for recliner     Balance Overall balance assessment: Needs assistance Sitting-balance support: Bilateral upper extremity supported, Single extremity supported, Feet supported Sitting balance-Leahy Scale: Fair Sitting balance - Comments: did not challenge   Standing balance support: Bilateral upper extremity supported, During functional activity Standing balance-Leahy Scale: Poor                             ADL either performed or assessed with clinical judgement   ADL Overall ADL's : Needs assistance/impaired Eating/Feeding: Set up;Sitting   Grooming: Set up;Sitting               Lower Body Dressing: Bed level;Total assistance   Toilet Transfer: Moderate assistance;+2 for physical assistance;+2 for safety/equipment;Ambulation;Rolling walker (2 wheels) Toilet Transfer Details (indicate cue type and reason): increased assist required Toileting- Clothing Manipulation and Hygiene: Total assistance Toileting - Clothing Manipulation Details (indicate cue type and reason): foley intact     Functional mobility during ADLs: Moderate assistance;+2 for physical assistance;+2 for safety/equipment;Cueing for safety;Cueing for sequencing;Rolling walker (2 wheels) General ADL Comments: Patient seen in conjunction with PT in order to progress  with OOB activity. Patient now extubated, oriented, with occasional moments of  impulsivity. Patient continues to complain of back pain, and discomfort from minor scrotal swelling. Patient mod A of 2 for bed mobility and requiring increased cues to process log roll technique. Patient mod A of 2 to come into standing, able to ambulate a few feet, but requiring seated rest break. Patient placed on 2L at end of session, however pleth inaccurate despite multiple attempts. OT recommendation remains appropriate.    Extremity/Trunk Assessment Upper Extremity Assessment Upper Extremity Assessment: Generalized weakness;Left hand dominant;RUE deficits/detail;LUE deficits/detail RUE Deficits / Details: tremulous with movement RUE Coordination: decreased gross motor LUE Deficits / Details: tremulous with movement LUE Coordination: decreased gross motor   Lower Extremity Assessment Lower Extremity Assessment: Defer to PT evaluation        Vision   Vision Assessment?: No apparent visual deficits   Perception Perception Perception: Not tested   Praxis Praxis Praxis: Not tested   Communication Communication Communication: Impaired Factors Affecting Communication: Trach/intubated (ETT)   Cognition Arousal: Alert Behavior During Therapy: WFL for tasks assessed/performed Cognition: Cognition impaired   Orientation impairments: Time, Situation Awareness: Online awareness impaired Memory impairment (select all impairments): Short-term memory Attention impairment (select first level of impairment): Selective attention Executive functioning impairment (select all impairments): Reasoning, Sequencing, Problem solving OT - Cognition Comments: Communicating well, occasionally distracted but can correct with cues, benefits from reasoning to help him understand, minimally impulsive but improving                 Following commands: Intact        Cueing   Cueing Techniques: Verbal cues, Tactile cues  Exercises      Shoulder Instructions       General Comments       Pertinent Vitals/ Pain       Pain Assessment Pain Assessment: Faces Faces Pain Scale: Hurts whole lot Pain Location: back, radiculopathy in RLE Pain Descriptors / Indicators: Discomfort, Guarding, Radiating Pain Intervention(s): Repositioned, Monitored during session, Limited activity within patient's tolerance  Home Living                                          Prior Functioning/Environment              Frequency  Min 2X/week        Progress Toward Goals  OT Goals(current goals can now be found in the care plan section)  Progress towards OT goals: Progressing toward goals  Acute Rehab OT Goals Patient Stated Goal: to get better OT Goal Formulation: With patient Time For Goal Achievement: 05/01/24 Potential to Achieve Goals: Good  Plan      Co-evaluation    PT/OT/SLP Co-Evaluation/Treatment: Yes Reason for Co-Treatment: To address functional/ADL transfers;Complexity of the patient's impairments (multi-system involvement)   OT goals addressed during session: ADL's and self-care      AM-PAC OT 6 Clicks Daily Activity     Outcome Measure   Help from another person eating meals?: A Little Help from another person taking care of personal grooming?: A Little Help from another person toileting, which includes using toliet, bedpan, or urinal?: A Lot Help from another person bathing (including washing, rinsing, drying)?: A Lot Help from another person to put on and taking off regular upper body clothing?: A Little Help from another person to put on and taking off regular lower  body clothing?: Total 6 Click Score: 14    End of Session Equipment Utilized During Treatment: Oxygen;Gait belt;Rolling walker (2 wheels) (ETT via vent)  OT Visit Diagnosis: Unsteadiness on feet (R26.81);Muscle weakness (generalized) (M62.81);Pain   Activity Tolerance Patient tolerated treatment well (back pain)   Patient Left in bed;with call bell/phone within  reach;with bed alarm set;with restraints reapplied;with SCD's reapplied   Nurse Communication Mobility status        Time: 8955-8868 OT Time Calculation (min): 47 min  Charges: OT General Charges $OT Visit: 1 Visit OT Treatments $Self Care/Home Management : 23-37 mins  Ronal Gift E. Brodi Kari, OTR/L Acute Rehabilitation Services [phone]   Ronal Gift Salt 04/22/2024, 2:09 PM

## 2024-04-22 NOTE — Progress Notes (Signed)
 Nutrition Follow-up  DOCUMENTATION CODES:  Severe malnutrition in context of social or environmental circumstances  INTERVENTION:  Continue regular diet as ordered Ensure Plus High Protein po BID, each supplement provides 350 kcal and 20 grams of protein. Juven BID to support wound healing Continue MVI and folvite  in setting of IVDU and EtOH use  NUTRITION DIAGNOSIS:  Severe Malnutrition related to social / environmental circumstances (IVDU and alcohol abuse) as evidenced by severe fat depletion, severe muscle depletion. - remains applicable  GOAL:  Patient will meet greater than or equal to 90% of their needs - progressing, addressing via meals and snacks  MONITOR:  Vent status, TF tolerance, Labs  REASON FOR ASSESSMENT:  Ventilator    ASSESSMENT:  Pt admitted with metabolic encephalopathy and presumed septic shock d/t metastatic MSSA infection with concern for endocarditis. PMH significant for recent admission (10/2-10/13 leaving AMA) for MSSA, PNA, psoas abscesses and lumbar osteomyelitis, tobacco use, IVDU and alcohol use  11/2: admitted, transferred from [facility] to [facility] 11/3: extubated; respiratory distress; re-intubated, cardiac arrest, ROSC  11/7: cortrak placed 11/6: extubated; diet advanced to regular  Pt working with PT at time of visit.  RN reports in rounds pt with good appetite and oral intake. Cortrak removed.  No documented meals on file to review at this time.   Likely plan to transfer out of ICU today pending precedex taper.   Admit weight: 62.5 kg Current weight: 68.8 kg  Medications: folvite , SSI 2-6 units QID, MVI  Labs:  BUN 73 Cr 1.45 CBG's 107-264 x24 hours  Diet Order:   Diet Order             Diet regular Room service appropriate? Yes; Fluid consistency: Thin  Diet effective now                   EDUCATION NEEDS:   Not appropriate for education at this time  Skin:  Skin Integrity Issues:: Stage II Stage II: Per WOC: 2 ruptured  blisters 2 cm x 1 cm x 0.1 cm and 0.5 cm x 0.5 cm x 0.1 cm.  Last BM:  11/9 type 6 large  Height:   Ht Readings from Last 1 Encounters:  04/19/24 6' (1.829 m)    Weight:   Wt Readings from Last 1 Encounters:  04/22/24 68.8 kg   BMI:  Body mass index is 20.57 kg/m.  Estimated Nutritional Needs:   Kcal:  2000-2200  Protein:  100-115g  Fluid:  >/=2L  Royce Maris, RDN, LDN Clinical Nutrition See AMiON for contact information.

## 2024-04-22 NOTE — Progress Notes (Signed)
 Physical Therapy Treatment Patient Details Name: [name] [name] MRN: [id number] DOB: 1976/02/22 Today's Date: 04/22/2024   History of Present Illness 48 yo M recently admitted with MSSA PNA MSSA bacteremia psoas abscesses and lumbar osteo 03/14/24-03/25/24 (left AMA) who presented to [facility] ED 04/14/24 w AMS. Admitted with sepsis and transferred to [facility]  Arrived to ED covered in feces,  altered, with poor airway protection and was intubated, extubated on 11/9. PMH includes: anxiety, HTN    PT Comments  Pt seen with OT in order to progress OOB mobility safely given prior impulsive nature. Pt limited in safe mobility due to back pain and scrotal pain with bed mobility due to swelling. Pt is modAx2 for maintaining log rolling technique with getting to EoB and getting back into bed. Pt is modAx2 for coming to standing and walking to door with RW. Pt with increased DoE and bilateral knee buckling requiring sitting in recliner to return to beside bed. Pt is modA for return to EoB. D/c plans remain appropriate. PT will continue to follow acutely.    If plan is discharge home, recommend the following: Assist for transportation;Help with stairs or ramp for entrance;A lot of help with walking and/or transfers;A lot of help with bathing/dressing/bathroom;Assistance with cooking/housework;Direct supervision/assist for medications management;Direct supervision/assist for financial management     Equipment Recommendations  None recommended by PT       Precautions / Restrictions Precautions Precautions: Fall Recall of Precautions/Restrictions: Intact Restrictions Weight Bearing Restrictions Per Provider Order: No     Mobility  Bed Mobility Overal bed mobility: Needs Assistance Bed Mobility: Rolling Rolling: Used rails, Mod assist Sidelying to sit: Mod assist, +2 for physical assistance, +2 for safety/equipment       General bed mobility comments: mod A to roll, mod A of 2 to come into sitting,  increased time and effort to sequence appropriately    Transfers Overall transfer level: Needs assistance Equipment used: Rolling walker (2 wheels) Transfers: Sit to/from Stand Sit to Stand: Mod assist, +2 physical assistance, +2 safety/equipment           General transfer comment: Mod A of 2 to come into standing, increased tremulous movement noted in BUEs, able to ambulate a few feeet before BLE buckling and need for recliner    Ambulation/Gait Ambulation/Gait assistance: Mod assist Gait Distance (Feet): 10 Feet Assistive device: Rolling walker (2 wheels) Gait Pattern/deviations: Step-through pattern, Decreased step length - right, Decreased step length - left       General Gait Details: increased effort for B foot clearance, vc for upright posture and proximity to RW, pt with 4/4 DoE and bilateral knee buckling with  walking to door pt, unable to get a good pleth signal on multiple probe placements but SpO2 likely in 80s, required sitting in recliner to return to beside bed, where 3L O2 via [place] were administered and when SpO2 finally accurate SpO2 95%O2, left on supplemental O2 given dyspnea, RN aware         Balance Overall balance assessment: Needs assistance Sitting-balance support: Bilateral upper extremity supported, Single extremity supported, Feet supported Sitting balance-Leahy Scale: Fair Sitting balance - Comments: did not challenge   Standing balance support: Bilateral upper extremity supported, During functional activity Standing balance-Leahy Scale: Poor                              Communication Communication Communication: Impaired Factors Affecting Communication: Trach/intubated (ETT)  Cognition Arousal:  Alert Behavior During Therapy: [facility] for tasks assessed/performed                             Following commands: Intact      Cueing Cueing Techniques: Verbal cues, Tactile cues     General Comments General comments (skin  integrity, edema, etc.): VSS, left on 2L O2 via [place] due to DoE      Pertinent Vitals/Pain Pain Assessment Pain Assessment: Faces Faces Pain Scale: Hurts whole lot Pain Location: back, radiculopathy in RLE Pain Descriptors / Indicators: Discomfort, Guarding, Radiating Pain Intervention(s): Repositioned, Monitored during session     PT Goals (current goals can now be found in the care plan section) Acute Rehab PT Goals PT Goal Formulation: With patient Time For Goal Achievement: 05/01/24 Potential to Achieve Goals: Good Progress towards PT goals: Progressing toward goals    Frequency    Min 3X/week           Co-evaluation PT/OT/SLP Co-Evaluation/Treatment: Yes Reason for Co-Treatment: To address functional/ADL transfers;Complexity of the patient's impairments (multi-system involvement) PT goals addressed during session: Mobility/safety with mobility OT goals addressed during session: ADL's and self-care      AM-PAC PT 6 Clicks Mobility   Outcome Measure  Help needed turning from your back to your side while in a flat bed without using bedrails?: A Little Help needed moving from lying on your back to sitting on the side of a flat bed without using bedrails?: A Lot Help needed moving to and from a bed to a chair (including a wheelchair)?: A Lot Help needed standing up from a chair using your arms (e.g., wheelchair or bedside chair)?: A Lot Help needed to walk in hospital room?: A Lot Help needed climbing 3-5 steps with a railing? : Total 6 Click Score: 12    End of Session Equipment Utilized During Treatment: Gait belt Activity Tolerance: Patient tolerated treatment well Patient left: in bed;with call bell/phone within reach;with family/visitor present;with restraints reapplied;with SCD's reapplied;with bed alarm set Nurse Communication: Mobility status;Other (comment) (supplemental Os) PT Visit Diagnosis: Unsteadiness on feet (R26.81);Muscle weakness (generalized)  (M62.81);Difficulty in walking, not elsewhere classified (R26.2)     Time: 8955-8865 PT Time Calculation (min) (ACUTE ONLY): 50 min  Charges:    $Gait Training: 8-22 mins PT General Charges $$ ACUTE PT VISIT: 1 Visit                     [employer] Null B. Fleeta Lapidus PT, DPT Acute Rehabilitation Services Please use secure chat or  Call Office [phone]    Almarie KATHEE [facility]eta [facility] 04/22/2024, 5:34 PM

## 2024-04-22 NOTE — Progress Notes (Signed)
 NAME:  [name] [name], MRN:  [id number], DOB:  [birth year], LOS: 8 ADMISSION DATE:  04/14/2024, CONSULTATION DATE:  04/14/24 REFERRING MD:  [place] - [facility] EM, CHIEF COMPLAINT:   AMS  History of Present Illness:  48 yo M PMH recently admitted with MSSA PNA MSSA bacteremia psoas abscesses and lumbar osteo 03/14/24-03/25/24 (left AMA) who presented to [facility] ED 04/14/24 w AMS. Arrived to ED covered in feces,  altered, with poor airway protection and was intubated CXR with RLL opacities  Confirmed bilateral septic pulmonary emboli, tricuspid valve endocarditis.  Required intubation and mechanical ventilation  Pertinent  Medical History  MSSA bacteremia MSSA PNA Lumbar osteomyelitis  Psoas abscess  Severe protein calorie malnutrition FTT in adult  Significant Hospital Events: Including procedures, antibiotic start and stop dates in addition to other pertinent events   11/2 [facility] ED w AMS. Intubated. Started on vanc mero. Txf request to [place] to [facility]  11/3 extubated, developed respiratory distress requiring reintubation. Brief cardiac arrest during intubation.  11/3 transthoracic echocardiogram > LVEF 40-45%, global hypokinesis, grade 1 diastolic dysfunction, normal RV function.  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  No mitral or aortic valve involvement 11/3 CT chest, abdomen, pelvis >> numerous bilateral cavitary and solid nodules consistent with septic emboli, left lower lobe consolidation with a small pleural effusion, right basilar atelectasis.  Moderate free fluid in the pelvis.  Erosive changes at T5-T6, L2-L3, L4-L5 suggestive of discitis 11/3 CT head >> no acute abnormality.  Opacified left maxillary sinus  Interim History / Subjective:  No acute event overnight, patient is much better this a.m., sats well on nasal cannula, conversational, tolerating diet, doing well with the scheduled Oxy with PRN for pain and withdrawal.  Added his home Ativan   today. DVT prophylaxis Patient will be downgraded to medical floor.     Objective    Blood pressure (!) 146/94, pulse 91, temperature 98.8 F (37.1 C), temperature source Oral, resp. rate 14, height 6' (1.829 m), weight 68.8 kg, SpO2 94%.    Vent Mode: PSV;CPAP FiO2 (%):  [40 %] 40 % PEEP:  [5 cmH20] 5 cmH20 Pressure Support:  [5 cmH20] 5 cmH20   Intake/Output Summary (Last 24 hours) at 04/22/2024 0745 Last data filed at 04/22/2024 0700 Gross per 24 hour  Intake 781.6 ml  Output 2620 ml  Net -1838.4 ml   Filed Weights   04/20/24 0436 04/21/24 0500 04/22/24 0500  Weight: 68.6 kg 69 kg 68.8 kg   Examination:  GEN: thin ill-appearing male, sats well on nasal cannula, not in distress Neuro: AO X3, no focal deficit, intact sensation and motor CV: S1-S2 normal Pulm: Symmetrical chest wall movement, clear to auscultation GI: Distended with positive bowel sounds Extremity no edema  Resolved problem list   Assessment and Plan   Severe sepsis  Lactic acidosis  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 Left AMA 10/13,  -Blood cultures this admission again consistent with MSSA bacteremia -Urine culture 60k colonies staph.  P: -Appreciate ID input, thoracic surgery input - Plan cefazolin  at least 6 weeks - No surgical intervention or angio vac recommended by thoracic surgery at this time   Acute hypoxic respiratory failure-improved extubated on 11/9, now on nasal cannula -suspect in the setting of severe sepsis, but also has history of substance abuse -septic embolization to both  lungs.  Acute Metabolic Encephalopathy-improved AMS-improved Hx of substance abuse  -Given multifactorial, likely most significant contributor his sepsis, uremia.  Superimposed on substance use - Off Precedex, weaning  with chronic vent - Doing well with scheduled oxy with as PRN for pain and withdrawal Added home Ativan  today on 04/22/2024   Acute renal failure, improving but with post ATN auto diuresis AGMA  -Appears to be stabilizing with hemodynamic support Hyperkalemia, resolved Hypernatremia P: R -Following creatinine, urine output.  Improving. - Appears to have a vigorous post ATN auto diuresis with evolving hypernatremia.  Need to increase his free water today 11/7, try to keep up with losses. - Replete electrolytes as indicated  Hyperbilirubinemia  Elevated Alk phos  -Likely shock liver P: -No evidence of intra-abdominal abnormality, hepatic abnormality on CT -Follow intermittent LFT  Anemia Thrombocytopenia - anemia stable from prior admission  Suspect thrombocytopenia in setting of severe sepsis.  DIC panel reassuring P: -Follow CBC trend - Transfusion goals hemoglobin 7.0, platelets 10  Hx of HTN P: -Antihypertensive regimen on hold - Follow telemetry  Hx of Anxiety  P:  -Home meds on hold.  He uses Xanax , oxycodone  as an outpatient (question illicit) -Wean Precedex as able  Stage 2 pressure ulcer sacrum, present on admission -Continue wound care.  Appreciate consultation  No acute event overnight, patient is much better this a.m., sats well on nasal cannula, conversational, tolerating diet, doing well with the scheduled Oxy with PRN for pain and withdrawal.  Added his home Ativan  today. DVT prophylaxis Patient will be downgraded to medical floor.     Dispo: Transfer out of the ICU   Critical care time: 30 minutes   Lenny Drought, MD  [facility] Prefer epic messenger for cross cover needs   Critical care time was exclusive of separately billable procedures and treating other patients.  Critical care was necessary to treat or prevent imminent or life-threatening deterioration.  Critical care was time spent personally by me on the  following activities: development of treatment plan with patient and/or surrogate as well as nursing, discussions with consultants, evaluation of patient's response to treatment, examination of patient, obtaining history from patient or surrogate, ordering and performing treatments and interventions, ordering and review of laboratory studies, ordering and review of radiographic studies, pulse oximetry, re-evaluation of patient's condition and participation in multidisciplinary rounds.

## 2024-04-22 NOTE — TOC Progression Note (Signed)
 Transition of Care [facility]) - Progression Note    Patient Details  Name: [name] [name] MRN: [id number] Date of Birth: 04-18-1976  Transition of Care [facility]) CM/SW Contact  Lendia Dais, [place] Phone Number: 04/22/2024, 3:37 PM  Clinical Narrative:  CIR is still following. Per CIR admission, pt needs to finish IV abx before being considered for CIR d/t hx of IV DU.  CSW will continue to monitor.                      Expected Discharge Plan and Services                                               Social Drivers of Health (SDOH) Interventions SDOH Screenings   Food Insecurity: No Food Insecurity (03/15/2024)  Housing: Low Risk  (03/15/2024)  Transportation Needs: No Transportation Needs (03/15/2024)  Utilities: Not At Risk (03/15/2024)  Tobacco Use: High Risk (03/14/2024)    Readmission Risk Interventions    04/15/2024    4:16 PM 03/25/2024    9:50 AM 03/22/2024   10:01 AM  Readmission Risk Prevention Plan  Transportation Screening Complete Complete Complete  PCP or Specialist Appt within 5-7 Days Complete    Home Care Screening Complete Complete Complete  Medication Review (RN CM) Referral to Pharmacy Complete Complete

## 2024-04-23 ENCOUNTER — Inpatient Hospital Stay (HOSPITAL_COMMUNITY): Payer: MEDICAID

## 2024-04-23 DIAGNOSIS — R6520 Severe sepsis without septic shock: Secondary | ICD-10-CM

## 2024-04-23 DIAGNOSIS — I368 Other nonrheumatic tricuspid valve disorders: Secondary | ICD-10-CM

## 2024-04-23 DIAGNOSIS — I339 Acute and subacute endocarditis, unspecified: Secondary | ICD-10-CM

## 2024-04-23 DIAGNOSIS — J9601 Acute respiratory failure with hypoxia: Secondary | ICD-10-CM

## 2024-04-23 LAB — RENAL FUNCTION PANEL
Albumin: 1.8 g/dL — ABNORMAL LOW (ref 3.5–5.0)
Anion gap: 15 (ref 5–15)
BUN: 71 mg/dL — ABNORMAL HIGH (ref 6–20)
CO2: 19 mmol/L — ABNORMAL LOW (ref 22–32)
Calcium: 8.2 mg/dL — ABNORMAL LOW (ref 8.9–10.3)
Chloride: 111 mmol/L (ref 98–111)
Creatinine, Ser: 1.62 mg/dL — ABNORMAL HIGH (ref 0.61–1.24)
GFR, Estimated: 53 mL/min — ABNORMAL LOW (ref >60)
Glucose, Bld: 87 mg/dL (ref 70–99)
Phosphorus: 4.2 mg/dL (ref 2.5–4.6)
Potassium: 4.6 mmol/L (ref 3.5–5.1)
Sodium: 145 mmol/L (ref 135–145)

## 2024-04-23 LAB — GLUCOSE, CAPILLARY
Glucose-Capillary: 113 mg/dL — ABNORMAL HIGH (ref 70–99)
Glucose-Capillary: 132 mg/dL — ABNORMAL HIGH (ref 70–99)
Glucose-Capillary: 116 mg/dL — ABNORMAL HIGH (ref 70–99)
Glucose-Capillary: 120 mg/dL — ABNORMAL HIGH (ref 70–99)

## 2024-04-23 LAB — TRIGLYCERIDES: Triglycerides: 102 mg/dL (ref <150)

## 2024-04-23 LAB — CBC
HCT: 34.9 % — ABNORMAL LOW (ref 39.0–52.0)
Hemoglobin: 10.4 g/dL — ABNORMAL LOW (ref 13.0–17.0)
MCH: 29.8 pg (ref 26.0–34.0)
MCHC: 29.8 g/dL — ABNORMAL LOW (ref 30.0–36.0)
MCV: 100 fL (ref 80.0–100.0)
Platelets: 217 K/uL (ref 150–400)
RBC: 3.49 MIL/uL — ABNORMAL LOW (ref 4.22–5.81)
RDW: 17.8 % — ABNORMAL HIGH (ref 11.5–15.5)
WBC: 19.8 K/uL — ABNORMAL HIGH (ref 4.0–10.5)
nRBC: 0 % (ref 0.0–0.2)

## 2024-04-23 MED ORDER — HYDROXYZINE HCL 10 MG PO TABS
10.0000 mg | ORAL_TABLET | Freq: Three times a day (TID) | ORAL | Status: DC | PRN
  Administered 2024-04-25: 10 mg via ORAL
  Filled 2024-04-23 (×2): qty 1

## 2024-04-23 MED ORDER — LORAZEPAM 2 MG/ML IJ SOLN
1.0000 mg | Freq: Once | INTRAMUSCULAR | Status: DC | PRN
  Administered 2024-04-23: 1 mg via INTRAVENOUS
  Filled 2024-04-23: qty 1

## 2024-04-23 MED ORDER — GADOBUTROL 1 MMOL/ML IV SOLN
7.0000 mL | Freq: Once | INTRAVENOUS | Status: AC | PRN
  Administered 2024-04-23: 7 mL via INTRAVENOUS

## 2024-04-23 NOTE — Progress Notes (Signed)
 Attempted this patient with max amount of meds. Pt unable to hold still. He was shaking, breathing erratically and flailing arms during scan. The first two sequences obtained were not diagnostic. I then spoke with patient and he said he was holding still and could not do any better. Scan was then aborted and patient was sent back up to his room.

## 2024-04-23 NOTE — Progress Notes (Signed)
 [facility]  Assessment and Plan: MSSA Bacteremia Lumbar Osteomyelitis Tricuspid Valve Endocarditis Septic Pulmonary Emboli Acute Hypoxic Respiratory Failure: Previously intubated. Now on room air but tachypneic  AKI  The patient is a 48 year old male who was recently admitted with MSSA pneumonia,  MSSA bacteremia, psoas abscesses and lumbar osteomyelitis. He originally presented on 03/14/24-03/25/24 and was found to have multi focal septic lung emboli, small pericaridal effusion, lumbar OM in the setting of MSSA bacteremia but left AMA. He now represents to the ED on 11/2 with AMS and inability to protect his airway requiring intubation. He was empirically started on Vancomycin  and Meropenem. Blood cultures were obtained and are growing MSSA. Urine culture is growing MSSA. CT head showed chronic maxillary sinusitis. Antibiotics were changed to Cefazolin   - Recommend continuing Cefazolin  2g IV q12. He will need 6-8 weeks of IV therapy - If his creatinine continues to improve, recommend repeating MRI of his thoracic and lumbar spine - Unfortunately, his respiratory status seems to be worsening. High risk for decompensation   Patient Active Problem List   Diagnosis Date Noted   Sepsis (HCC) 04/14/2024   Severe sepsis (HCC) 04/14/2024   Protein-calorie malnutrition, severe 03/22/2024   Septic embolism (HCC) 03/15/2024   Pressure injury of skin 03/15/2024   Lactic acidosis 03/15/2024   Osteomyelitis of lumbar spine (HCC) 03/15/2024   Thrombocytopenia 03/15/2024   Transaminasemia 03/15/2024   Hypoalbuminemia 03/15/2024   Failure to thrive in adult 03/15/2024   Sepsis due to pneumonia (HCC) 11/07/2016   Leukopenia 11/07/2016   Acute respiratory failure with hypoxia (HCC) 11/07/2016   Anxiety disorder 11/07/2016   Hyperbilirubinemia 11/07/2016   Fever 01/18/2014   CAP (community acquired pneumonia) 05/19/2013   Sinus tachycardia 05/19/2013   Tobacco abuse  05/19/2013   Current Discharge Medication List     CONTINUE these medications which have NOT CHANGED   Details  acetaminophen  (TYLENOL ) 500 MG tablet Take 500 mg by mouth every 6 (six) hours as needed for mild pain (pain score 1-3).    ALPRAZolam  (XANAX ) 1 MG tablet Take 1 mg by mouth in the morning, at noon, in the evening, and at bedtime.    lidocaine  (LMX) 4 % cream Apply 1 Application topically as needed (pain).    naproxen  sodium (ALEVE ) 220 MG tablet Take 220 mg by mouth daily as needed (pain).    Oxycodone  HCl 10 MG TABS Take 1 tablet by mouth in the morning, at noon, in the evening, and at bedtime.       Subjective: The patient is lying in bed in mild distress. He appears in pain and is complaining of severe lower back pain. He also states that he is short of breath and has a cough. Patient states that he has been coughing up blood. Chest xray ordered today and showed worsening airspace disease in the left lung. Patient is quite tachypneic and needed to catch his breath after taking a sip of his soda.   Review of Systems: Review of Systems  Constitutional:  Negative for chills and fever.  HENT:  Negative for sore throat.   Eyes:  Negative for blurred vision.  Respiratory:  Positive for cough, hemoptysis and shortness of breath.   Cardiovascular:  Negative for chest pain, palpitations and leg swelling.  Gastrointestinal:  Negative for abdominal pain, constipation, diarrhea, nausea and vomiting.  Genitourinary:  Negative for dysuria.  Musculoskeletal:  Positive for back pain.  Skin:  Negative for itching and rash.  Neurological:  Negative for focal weakness and headaches.   Past Medical History:  Diagnosis Date   Anxiety    Back pain    Fracture of rib of right side 01/26/2015   Hypertension    Social History   Tobacco Use   Smoking status: Every Day    Current packs/day: 0.50    Types: Cigarettes   Smokeless tobacco: Never  Substance Use Topics   Alcohol use:  No    Comment: rare   Drug use: Yes    Types: Heroin    Comment: reports history of cocaine abuse.-last used years ago   No family history on file.  Allergies  Allergen Reactions   Penicillins Other (See Comments)    Unknown    Objective: Vitals:   04/23/24 0456 04/23/24 0914 04/23/24 1134 04/23/24 1230  BP: (!) 133/105 (!) 141/99 (!) 147/103 (!) 140/101  Pulse: 91 (!) 110 (!) 128 (!) 116  Resp: 18 18 (!) 26 20  Temp: 98.7 F (37.1 C) 98.9 F (37.2 C)    TempSrc: Oral Oral    SpO2: 99% 98% 97% 97%  Weight:      Height:       Body mass index is 20.57 kg/m.  General: Thin, acutely ill appearing male lying in bed in mild distress. Disheveled  HENT: Moist mucous membranes, normal nose, normal external ears, and normocephalic Neck: Supple, trachea midline, and normal cervical range of motion Eyes: PERRL, EOMI, non-icteric, and normal conjunctivae and lids Lungs: Tachypneic. Diminished breath sounds bilaterally  Cardiac: Tachycardic. Regular rhythm. No murmurs, rubs or gallops. Trace lower extremity pitting edema  Abdomen: Soft, Non distended, Non tender, active bowel sounds Skin: Intact, no focal erythema or rash, and warm and dry GU: Defered genital exam Musculoskeletal: No obvious skeletal abnormalities Neuro: Alert, no focal neurologic deficits. He can move all of his extremities but is complaining of significant back pain  Psych: Cooperative   Problem List Items Addressed This Visit       Other   * (Principal) Sepsis (HCC) - Primary   Other Visit Diagnoses       Altered mental status, unspecified altered mental status type         AKI (acute kidney injury)         Dehydration          Evalene CHRISTELLA Munch, MD [facility] 04/23/2024, 5:35 PM

## 2024-04-23 NOTE — Progress Notes (Addendum)
 PROGRESS NOTE  [name] [name] FMW:[id number] DOB: [birth year] DOA: 04/14/2024 PCP: Bertell Satterfield, MD   LOS: 9 days   Brief Narrative / Interim history: 48 year old male with history of drug use in the past, prior MSSA pneumonia and MSSA bacteremia, lumbar osteomyelitis admitted 10/2-10/13 2025 (left AMA) who came back to the hospital on 11/2 with altered mental status, covered in feces, poor airway protection and had to be intubated.  He was transferred to [facility] and admitted to the ICU.  He was found to have recurrent MSSA bacteremia, ID was consulted.  He has improved, he was eventually extubated and transferred to the hospitalist service on 11/11.  Significant imaging / results / micro data: 11/3 transthoracic echocardiogram > LVEF 40-45%, global hypokinesis, grade 1 diastolic dysfunction, normal RV function.  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  No mitral or aortic valve involvement 11/3 CT chest, abdomen, pelvis >> numerous bilateral cavitary and solid nodules consistent with septic emboli, left lower lobe consolidation with a small pleural effusion, right basilar atelectasis.  Moderate free fluid in the pelvis.  Erosive changes at T5-T6, L2-L3, L4-L5 suggestive of discitis 11/3 CT head >> no acute abnormality.  Opacified left maxillary sinus  Subjective / 24h Interval events: He is doing well this morning, feeling much better than when he was in the ICU.  He is somewhat emotional about having gone through all this.  Addendum 12:30 pm. Messaged by RN that the patient is having increased WOB, more tachycardia, complaining of shortness of breath. Evaluated at bedside. Vitals are stable, he is tachycardic which is not new. Complains of chest pain with coughing, which is not new. EKG non ischemic. Stat CXR shows slightly worsening left lung airspace disease, likely due to underlying processes. Given increase WOB, infiltrate, will transfer  medsurg >> progressive for closer monitoring.   Assesement and Plan: Principal problem Severe sepsis due to MSSA bacteremia, with discitis/osteomyelitis at the lumbar spine level, recent psoas abscess, septic pulmonary emboli - blood cultures on admission 11/2 are showing MSSA bacteremia.  Imaging this hospitalization showed septic pulmonary emboli, an MRI done during prior hospitalization on 10/3 showed discitis/osteomyelitis L3-L4, L5-S1 with a septic right facet joint at L5-S1.  There was also paraspinal inflammation with small bilateral psoas abscesses - 2D echo as above with tricuspid valve mobile masses - Cardiothoracic surgery evaluated patient, recommending medical management and no role for angio vac - ID consulted, currently on Ancef  with plans for probably 6 weeks  Active problems Acute hypoxic respiratory failure-intubated initially, extubated 11/9, now on room air.  Continue antibiotics as above for MSSA pneumonia  Acute metabolic encephalopathy-in the setting of severe sepsis, resolved and he is at baseline  History of substance abuse-tells me he has been clean for several months, has not done any drugs, not drinking, but smoking cigarettes still.  Acute kidney injury -likely in the setting of severe sepsis, creatinine on admission at 4.0, currently at 1.6 and stable over the last 3 days.  Has a degree of ATN from his sepsis picture, will continue to monitor as he may have a degree of chronic kidney disease moving forward  Hyperkalemia-resolved  Hypernatremia-resolved  Elevated LFTs-due to sepsis, shock liver, no intra-abdominal abnormality on CT   Anemia, thrombocytopenia-counts improving  History of hypertension-blood pressure is stable  History of anxiety-Home meds on hold, was on Precedex in the ICU.  Currently appears stable   Stage 2 pressure ulcer sacrum, present on  admission -Continue wound care.  Appreciate consultation  Scheduled Meds:  ALPRAZolam   1 mg Oral  Q8H   Chlorhexidine Gluconate Cloth  6 each Topical Daily   cloNIDine  0.1 mg Oral TID   feeding supplement  237 mL Oral BID BM   folic acid   1 mg Oral Daily   heparin  injection (subcutaneous)  5,000 Units Subcutaneous Q8H   insulin aspart  2-6 Units Subcutaneous TID AC & HS   multivitamin with minerals  1 tablet Oral Daily   nutrition supplement (JUVEN)  1 packet Oral BID BM   oxyCODONE   5 mg Oral Q6H   Continuous Infusions:   ceFAZolin  (ANCEF ) IV 2 g (04/23/24 0648)   dexmedetomidine (PRECEDEX) IV infusion Stopped (04/22/24 1320)   PRN Meds:.acetaminophen , docusate, guaiFENesin , HYDROmorphone  (DILAUDID ) injection, ipratropium-albuterol , methocarbamol, ondansetron  (ZOFRAN ) IV, mouth rinse, oxyCODONE , polyethylene glycol  Current Outpatient Medications  Medication Instructions   acetaminophen  (TYLENOL ) 500 mg, Every 6 hours PRN   ALPRAZolam  (XANAX ) 1 mg, 4 times daily   lidocaine  (LMX) 4 % cream 1 Application, As needed   naproxen  sodium (ALEVE ) 220 mg, Daily PRN   Oxycodone  HCl 10 MG TABS 1 tablet, 4 times daily    Diet Orders (From admission, onward)     Start     Ordered   04/21/24 0934  Diet regular Room service appropriate? Yes; Fluid consistency: Thin  Diet effective now       Question Answer Comment  Room service appropriate? Yes   Fluid consistency: Thin      04/21/24 0933            DVT prophylaxis: heparin  injection 5,000 Units Start: 04/20/24 1400 SCDs Start: 04/14/24 2038   Lab Results  Component Value Date   PLT 217 04/23/2024      Code Status: Full Code  Family Communication: No family at bedside  Status is: Inpatient Remains inpatient appropriate because: Severity of illness   Level of care: Med-Surg  Consultants:  Cardiothoracic surgery PCCM ID  Objective: Vitals:   04/22/24 1700 04/22/24 1817 04/22/24 1955 04/23/24 0456  BP:  (!) 143/100 (!) 141/94 (!) 133/105  Pulse: 99 98 89 91  Resp: (!) 24 20 18 18   Temp:  (!) 100.4 F (38  C) (!) 100.4 F (38 C) 98.7 F (37.1 C)  TempSrc:  Oral Oral Oral  SpO2: 97% 96% 96% 99%  Weight:      Height:        Intake/Output Summary (Last 24 hours) at 04/23/2024 0844 Last data filed at 04/22/2024 1600 Gross per 24 hour  Intake 147.85 ml  Output 1575 ml  Net -1427.15 ml   Wt Readings from Last 3 Encounters:  04/22/24 68.8 kg  03/15/24 62.5 kg  11/09/16 74.5 kg    Examination:  Constitutional: NAD Eyes: no scleral icterus ENMT: Mucous membranes are moist.  Neck: normal, supple Respiratory: clear to auscultation bilaterally, no wheezing, no crackles. Normal respiratory effort. Cardiovascular: Regular rate and rhythm, no murmurs / rubs / gallops. No LE edema.  Abdomen: non distended, no tenderness. Bowel sounds positive.  Musculoskeletal: no clubbing / cyanosis.   Data Reviewed: I have independently reviewed following labs and imaging studies   CBC Recent Labs  Lab 04/19/24 0507 04/20/24 0511 04/21/24 0944 04/22/24 0628 04/23/24 0509  WBC 10.9* 10.3 12.5* 15.5* 19.8*  HGB 7.2* 7.0* 7.2* 7.5* 10.4*  HCT 23.1* 22.7* 24.6* 25.1* 34.9*  PLT 83* 108* 135* 194 217  MCV 93.1 95.8 99.6 98.4 100.0  MCH 29.0 29.5 29.1 29.4 29.8  MCHC 31.2 30.8 29.3* 29.9* 29.8*  RDW 17.1* 18.4* 18.0* 18.0* 17.8*    Recent Labs  Lab 04/16/24 1017 04/17/24 0330 04/17/24 0330 04/18/24 0157 04/19/24 0507 04/20/24 0511 04/21/24 0943 04/22/24 0627 04/23/24 0509  NA  --  144   < > 149* 154* 152* 151* 144 145  K  --  3.8   < > 3.8 3.8 3.5 3.9 3.6 4.6  CL  --  105   < > 110 115* 114* 115* 109 111  CO2  --  24   < > 25 25 24 26 24  19*  GLUCOSE  --  154*   < > 169* 126* 131* 133* 105* 87  BUN  --  98*   < > 94* 90* 92* 88* 73* 71*  CREATININE  --  3.32*   < > 3.13* 2.50* 2.04* 1.60* 1.45* 1.62*  CALCIUM  --  6.4*   < > 7.0* 7.8* 7.8* 7.8* 7.9* 8.2*  ALBUMIN  --   --   --   --   --   --  1.7* 1.6* 1.8*  MG  --  2.2  --  2.2 2.3 2.0  --   --   --   DDIMER >20.00*  --   --   --    --   --   --   --   --   INR 1.5*  --   --   --   --   --   --   --   --    < > = values in this interval not displayed.    ------------------------------------------------------------------------------------------------------------------ Recent Labs    04/23/24 0509  TRIG 102    Lab Results  Component Value Date   HGBA1C  06/13/2007    5.1 (NOTE)   The ADA recommends the following therapeutic goals for glycemic   control related to Hgb A1C measurement:   Goal of Therapy:   < 7.0% Hgb A1C   Action Suggested:  > 8.0% Hgb A1C   Ref:  Diabetes Care, 22, Suppl. 1, 1999   ------------------------------------------------------------------------------------------------------------------ No results for input(s): TSH, T4TOTAL, T3FREE, THYROIDAB in the last 72 hours.  Invalid input(s): FREET3  Cardiac Enzymes No results for input(s): CKMB, TROPONINI, MYOGLOBIN in the last 168 hours.  Invalid input(s): CK ------------------------------------------------------------------------------------------------------------------ No results found for: BNP  CBG: Recent Labs  Lab 04/22/24 0734 04/22/24 1139 04/22/24 1531 04/22/24 2155 04/23/24 0822  GLUCAP 159* 70 108* 126* 113*    Recent Results (from the past 240 hours)  Blood Culture (routine x 2)     Status: Abnormal   Collection Time: 04/14/24  3:31 PM   Specimen: Blood  Result Value Ref Range Status   Specimen Description   Final    BLOOD LEFT ANTECUBITAL Performed at [facility], [address], [place], [place] [address]    Special Requests   Final    BOTTLES DRAWN AEROBIC AND ANAEROBIC Blood Culture adequate volume Performed at [facility], [address] [place], [place] [address]    Culture  Setup Time   Final    GRAM POSITIVE COCCI IN BOTH AEROBIC AND ANAEROBIC BOTTLES CRITICAL RESULT CALLED TO, READ BACK BY AND VERIFIED WITH: PRUITT,G ON 04/15/24 AT 0102 BY PURDIE,J CRITICAL RESULT CALLED TO, READ  BACK BY AND VERIFIED WITH: PHARMD M.MICHELLE AT 0743N 04/15/2024 BY T.SAAD. Performed at [facility], [address] [place], [place] [address]  Culture STAPHYLOCOCCUS AUREUS (A)  Final   Report Status 04/17/2024 FINAL  Final   Organism ID, Bacteria STAPHYLOCOCCUS AUREUS  Final      Susceptibility   Staphylococcus aureus - MIC*    CIPROFLOXACIN  <=0.5 SENSITIVE Sensitive     ERYTHROMYCIN <=0.25 SENSITIVE Sensitive     GENTAMICIN <=0.5 SENSITIVE Sensitive     OXACILLIN <=0.25 SENSITIVE Sensitive     TETRACYCLINE <=1 SENSITIVE Sensitive     VANCOMYCIN  1 SENSITIVE Sensitive     TRIMETH /SULFA  <=10 SENSITIVE Sensitive     CLINDAMYCIN  <=0.25 SENSITIVE Sensitive     RIFAMPIN <=0.5 SENSITIVE Sensitive     Inducible Clindamycin  NEGATIVE Sensitive     LINEZOLID  2 SENSITIVE Sensitive     * STAPHYLOCOCCUS AUREUS  Blood Culture ID Panel (Reflexed)     Status: Abnormal   Collection Time: 04/14/24  3:31 PM  Result Value Ref Range Status   Enterococcus faecalis NOT DETECTED NOT DETECTED Final   Enterococcus Faecium NOT DETECTED NOT DETECTED Final   Listeria monocytogenes NOT DETECTED NOT DETECTED Final   Staphylococcus species DETECTED (A) NOT DETECTED Final    Comment: CRITICAL RESULT CALLED TO, READ BACK BY AND VERIFIED WITH: PHARMD M.MICHELLE AT 0743N 04/15/2024 BY T.SAAD.    Staphylococcus aureus (BCID) DETECTED (A) NOT DETECTED Final    Comment: CRITICAL RESULT CALLED TO, READ BACK BY AND VERIFIED WITH: PHARMD M.MICHELLE AT 0743N 04/15/2024 BY T.SAAD.    Staphylococcus epidermidis NOT DETECTED NOT DETECTED Final   Staphylococcus lugdunensis NOT DETECTED NOT DETECTED Final   Streptococcus species NOT DETECTED NOT DETECTED Final   Streptococcus agalactiae NOT DETECTED NOT DETECTED Final   Streptococcus pneumoniae NOT DETECTED NOT DETECTED Final   Streptococcus pyogenes NOT DETECTED NOT DETECTED Final   A.calcoaceticus-baumannii NOT DETECTED NOT DETECTED Final   Bacteroides  fragilis NOT DETECTED NOT DETECTED Final   Enterobacterales NOT DETECTED NOT DETECTED Final   Enterobacter cloacae complex NOT DETECTED NOT DETECTED Final   Escherichia coli NOT DETECTED NOT DETECTED Final   Klebsiella aerogenes NOT DETECTED NOT DETECTED Final   Klebsiella oxytoca NOT DETECTED NOT DETECTED Final   Klebsiella pneumoniae NOT DETECTED NOT DETECTED Final   Proteus species NOT DETECTED NOT DETECTED Final   Salmonella species NOT DETECTED NOT DETECTED Final   Serratia marcescens NOT DETECTED NOT DETECTED Final   Haemophilus influenzae NOT DETECTED NOT DETECTED Final   Neisseria meningitidis NOT DETECTED NOT DETECTED Final   Pseudomonas aeruginosa NOT DETECTED NOT DETECTED Final   Stenotrophomonas maltophilia NOT DETECTED NOT DETECTED Final   Candida albicans NOT DETECTED NOT DETECTED Final   Candida auris NOT DETECTED NOT DETECTED Final   Candida glabrata NOT DETECTED NOT DETECTED Final   Candida krusei NOT DETECTED NOT DETECTED Final   Candida parapsilosis NOT DETECTED NOT DETECTED Final   Candida tropicalis NOT DETECTED NOT DETECTED Final   Cryptococcus neoformans/gattii NOT DETECTED NOT DETECTED Final   Meth resistant mecA/C and MREJ NOT DETECTED NOT DETECTED Final    Comment: Performed at [facility], [address], [place], [place] [address]  Resp panel by RT-PCR (RSV, Flu A&B, Covid) Anterior Nasal Swab     Status: None   Collection Time: 04/14/24  4:07 PM   Specimen: Anterior Nasal Swab  Result Value Ref Range Status   SARS Coronavirus 2 by RT PCR NEGATIVE NEGATIVE Final    Comment: (NOTE) SARS-CoV-2 target nucleic acids are NOT DETECTED.  The SARS-CoV-2 RNA is generally detectable in upper respiratory specimens during the  acute phase of infection. The lowest concentration of SARS-CoV-2 viral copies this assay can detect is 138 copies/mL. A negative result does not preclude SARS-Cov-2 infection and should not be used as the sole basis for treatment  or other patient management decisions. A negative result may occur with  improper specimen collection/handling, submission of specimen other than nasopharyngeal swab, presence of viral mutation(s) within the areas targeted by this assay, and inadequate number of viral copies(<138 copies/mL). A negative result must be combined with clinical observations, patient history, and epidemiological information. The expected result is Negative.  Fact Sheet for Patients:  bloggercourse.com  Fact Sheet for Healthcare Providers:  seriousbroker.it  This test is no t yet approved or cleared by the [place]  FDA and  has been authorized for detection and/or diagnosis of SARS-CoV-2 by FDA under an Emergency Use Authorization (EUA). This EUA will remain  in effect (meaning this test can be used) for the duration of the COVID-19 declaration under Section 564(b)(1) of the Act, 21 U.S.C.section 360bbb-3(b)(1), unless the authorization is terminated  or revoked sooner.       Influenza A by PCR NEGATIVE NEGATIVE Final   Influenza B by PCR NEGATIVE NEGATIVE Final    Comment: (NOTE) The Xpert Xpress SARS-CoV-2/FLU/RSV plus assay is intended as an aid in the diagnosis of influenza from Nasopharyngeal swab specimens and should not be used as a sole basis for treatment. Nasal washings and aspirates are unacceptable for Xpert Xpress SARS-CoV-2/FLU/RSV testing.  Fact Sheet for Patients: bloggercourse.com  Fact Sheet for Healthcare Providers: seriousbroker.it  This test is not yet approved or cleared by the [place]  FDA and has been authorized for detection and/or diagnosis of SARS-CoV-2 by FDA under an Emergency Use Authorization (EUA). This EUA will remain in effect (meaning this test can be used) for the duration of the COVID-19 declaration under Section 564(b)(1) of the Act, 21 U.S.C. section  360bbb-3(b)(1), unless the authorization is terminated or revoked.     Resp Syncytial Virus by PCR NEGATIVE NEGATIVE Final    Comment: (NOTE) Fact Sheet for Patients: bloggercourse.com  Fact Sheet for Healthcare Providers: seriousbroker.it  This test is not yet approved or cleared by the [place]  FDA and has been authorized for detection and/or diagnosis of SARS-CoV-2 by FDA under an Emergency Use Authorization (EUA). This EUA will remain in effect (meaning this test can be used) for the duration of the COVID-19 declaration under Section 564(b)(1) of the Act, 21 U.S.C. section 360bbb-3(b)(1), unless the authorization is terminated or revoked.  Performed at [facility], [address] [place], [place] [address]   Urine Culture     Status: Abnormal   Collection Time: 04/14/24  4:07 PM   Specimen: Urine, Random  Result Value Ref Range Status   Specimen Description   Final    URINE, RANDOM Performed at [facility], [address] [place], [place] [address]    Special Requests   Final    NONE Reflexed from [phone] Performed at [facility], [address] [place], [place] [address]    Culture 60,000 COLONIES/mL STAPHYLOCOCCUS AUREUS (A)  Final   Report Status 04/16/2024 FINAL  Final   Organism ID, Bacteria STAPHYLOCOCCUS AUREUS (A)  Final      Susceptibility   Staphylococcus aureus - MIC*    CIPROFLOXACIN  <=0.5 SENSITIVE Sensitive     GENTAMICIN <=0.5 SENSITIVE Sensitive     NITROFURANTOIN <=16 SENSITIVE Sensitive     OXACILLIN <=0.25 SENSITIVE Sensitive     TETRACYCLINE <=1  SENSITIVE Sensitive     VANCOMYCIN  <=0.5 SENSITIVE Sensitive     TRIMETH /SULFA  <=10 SENSITIVE Sensitive     RIFAMPIN <=0.5 SENSITIVE Sensitive     Inducible Clindamycin  NEGATIVE Sensitive     LINEZOLID  2 SENSITIVE Sensitive     * 60,000 COLONIES/mL STAPHYLOCOCCUS AUREUS  Blood Culture (routine x 2)     Status: None   Collection Time: 04/14/24   6:31 PM   Specimen: BLOOD RIGHT WRIST  Result Value Ref Range Status   Specimen Description BLOOD RIGHT WRIST BLOOD  Final   Special Requests AEROBIC BOTTLE ONLY Blood Culture adequate volume  Final   Culture   Final    NO GROWTH 5 DAYS Performed at [facility], [address] [place], [place] [address]    Report Status 04/19/2024 FINAL  Final  Culture, Respiratory w Gram Stain     Status: None   Collection Time: 04/14/24  6:51 PM   Specimen: Tracheal Aspirate; Respiratory  Result Value Ref Range Status   Specimen Description TRACHEAL ASPIRATE  Final   Special Requests NONE  Final   Gram Stain   Final    RARE WBC PRESENT, PREDOMINANTLY PMN FEW GRAM POSITIVE COCCI FEW GRAM NEGATIVE RODS    Culture   Final    FEW Normal respiratory flora-no Staph aureus or Pseudomonas seen Performed at [facility], [address] [place], [place] [address]    Report Status 04/17/2024 FINAL  Final  MRSA Next Gen by PCR, Nasal     Status: None   Collection Time: 04/14/24  8:39 PM   Specimen: Nasal Mucosa; Nasal Swab  Result Value Ref Range Status   MRSA by PCR Next Gen NOT DETECTED NOT DETECTED Final    Comment: (NOTE) The GeneXpert MRSA Assay (FDA approved for NASAL specimens only), is one component of a comprehensive MRSA colonization surveillance program. It is not intended to diagnose MRSA infection nor to guide or monitor treatment for MRSA infections. Test performance is not FDA approved in patients less than 44 years old. Performed at [facility], [address], [place], [place] [address]   Culture, blood (Routine X 2) w Reflex to ID Panel     Status: None   Collection Time: 04/16/24 10:10 AM   Specimen: BLOOD RIGHT HAND  Result Value Ref Range Status   Specimen Description BLOOD RIGHT HAND  Final   Special Requests   Final    BOTTLES DRAWN AEROBIC ONLY Blood Culture results may not be optimal due to an inadequate volume of blood received in culture bottles   Culture    Final    NO GROWTH 5 DAYS Performed at [facility], [address] [place], [place] [address]    Report Status 04/21/2024 FINAL  Final  Culture, blood (Routine X 2) w Reflex to ID Panel     Status: None   Collection Time: 04/16/24 10:16 AM   Specimen: BLOOD LEFT WRIST  Result Value Ref Range Status   Specimen Description BLOOD LEFT WRIST  Final   Special Requests   Final    BOTTLES DRAWN AEROBIC ONLY Blood Culture results may not be optimal due to an inadequate volume of blood received in culture bottles   Culture   Final    NO GROWTH 5 DAYS Performed at [facility], [address] [place], [place] [address]    Report Status 04/21/2024 FINAL  Final     Radiology Studies: No results found.  Nilda Fendt, MD, PhD [facility]  Between 7 am - 7 pm I am available, please contact me via Amion (for emergencies) or Securechat (non urgent messages)  Between 7 pm - 7 am I am not available, please contact night coverage MD/APP via Amion

## 2024-04-23 NOTE — Plan of Care (Signed)
  Problem: Education: Goal: Knowledge of General Education information will improve Description: Including pain rating scale, medication(s)/side effects and non-pharmacologic comfort measures Outcome: Progressing   Problem: Clinical Measurements: Goal: Ability to maintain clinical measurements within normal limits will improve Outcome: Progressing Goal: Will remain free from infection Outcome: Progressing Goal: Diagnostic test results will improve Outcome: Progressing Goal: Respiratory complications will improve Outcome: Progressing Goal: Cardiovascular complication will be avoided Outcome: Progressing   Problem: Activity: Goal: Risk for activity intolerance will decrease Outcome: Progressing   Problem: Nutrition: Goal: Adequate nutrition will be maintained Outcome: Progressing   Problem: Coping: Goal: Level of anxiety will decrease Outcome: Progressing   Problem: Elimination: Goal: Will not experience complications related to bowel motility Outcome: Progressing Goal: Will not experience complications related to urinary retention Outcome: Progressing   Problem: Safety: Goal: Ability to remain free from injury will improve Outcome: Progressing   Problem: Safety: Goal: Non-violent Restraint(s) Outcome: Progressing

## 2024-04-24 ENCOUNTER — Inpatient Hospital Stay (HOSPITAL_COMMUNITY): Payer: MEDICAID

## 2024-04-24 DIAGNOSIS — M009 Pyogenic arthritis, unspecified: Secondary | ICD-10-CM

## 2024-04-24 LAB — CBC
HCT: 31.7 % — ABNORMAL LOW (ref 39.0–52.0)
Hemoglobin: 9.5 g/dL — ABNORMAL LOW (ref 13.0–17.0)
MCH: 29.3 pg (ref 26.0–34.0)
MCHC: 30 g/dL (ref 30.0–36.0)
MCV: 97.8 fL (ref 80.0–100.0)
Platelets: 242 K/uL (ref 150–400)
RBC: 3.24 MIL/uL — ABNORMAL LOW (ref 4.22–5.81)
RDW: 17.2 % — ABNORMAL HIGH (ref 11.5–15.5)
WBC: 16.1 K/uL — ABNORMAL HIGH (ref 4.0–10.5)
nRBC: 0 % (ref 0.0–0.2)

## 2024-04-24 LAB — COMPREHENSIVE METABOLIC PANEL WITH GFR
ALT: <5 U/L (ref 0–44)
AST: 22 U/L (ref 15–41)
Albumin: 1.5 g/dL — ABNORMAL LOW (ref 3.5–5.0)
Alkaline Phosphatase: 136 U/L — ABNORMAL HIGH (ref 38–126)
Anion gap: 11 (ref 5–15)
BUN: 66 mg/dL — ABNORMAL HIGH (ref 6–20)
CO2: 23 mmol/L (ref 22–32)
Calcium: 7.8 mg/dL — ABNORMAL LOW (ref 8.9–10.3)
Chloride: 108 mmol/L (ref 98–111)
Creatinine, Ser: 1.65 mg/dL — ABNORMAL HIGH (ref 0.61–1.24)
GFR, Estimated: 52 mL/min — ABNORMAL LOW (ref >60)
Glucose, Bld: 128 mg/dL — ABNORMAL HIGH (ref 70–99)
Potassium: 4.1 mmol/L (ref 3.5–5.1)
Sodium: 142 mmol/L (ref 135–145)
Total Bilirubin: 1.1 mg/dL (ref 0.0–1.2)
Total Protein: 6.7 g/dL (ref 6.5–8.1)

## 2024-04-24 LAB — GLUCOSE, CAPILLARY
Glucose-Capillary: 92 mg/dL (ref 70–99)
Glucose-Capillary: 123 mg/dL — ABNORMAL HIGH (ref 70–99)
Glucose-Capillary: 114 mg/dL — ABNORMAL HIGH (ref 70–99)
Glucose-Capillary: 128 mg/dL — ABNORMAL HIGH (ref 70–99)

## 2024-04-24 LAB — PHOSPHORUS: Phosphorus: 4.5 mg/dL (ref 2.5–4.6)

## 2024-04-24 LAB — MAGNESIUM: Magnesium: 1.8 mg/dL (ref 1.7–2.4)

## 2024-04-24 MED ORDER — GUAIFENESIN ER 600 MG PO TB12
1200.0000 mg | ORAL_TABLET | Freq: Two times a day (BID) | ORAL | Status: DC
  Administered 2024-04-24 – 2024-04-27 (×7): 1200 mg via ORAL
  Filled 2024-04-24 (×7): qty 2

## 2024-04-24 MED ORDER — SODIUM CHLORIDE 0.9 % IV SOLN
3.0000 g | Freq: Four times a day (QID) | INTRAVENOUS | Status: DC
  Administered 2024-04-24 – 2024-04-26 (×9): 3 g via INTRAVENOUS
  Filled 2024-04-24 (×9): qty 8

## 2024-04-24 NOTE — Progress Notes (Signed)
     Patient Name: [name] [name]           DOB: 1976-04-22  MRN: [id number]      Admission Date: 04/14/2024  Attending Provider: Trixie Nilda HERO, MD  Primary Diagnosis: Sepsis [facility])   Level of care: Progressive   OVERNIGHT EVENT   HPI/ Events of Note  [name], 48 y.o. male, was admitted on 04/14/2024 for Severe sepsis due to MSSA bacteremia, with discitis/osteomyelitis at the lumbar spine level.  This hospital stay has been complicated with endocarditis, recent psoas abscess, septic pulmonary emboli, and respiratory failure (recently intubated). Currently on cefazolin . Followed by ID who ordered a series of MRIs -- cervical/ lumbar/ thoracic spine completed tonight.   Notified by radiologist regarding abnormally widened space of the of the atlantodental interval.   MRI Cervical spine results Abnormal widening of the atlantodental interval which measures up to 10 mm in diameter. Enhancing soft tissue within the expanded atlantodental space, extending towards the basion. The basion dens interval is also abnormally widened up to 16 mm. Secondary mild basilar invagination of the dens into the ventral brainstem. Finding concerning for acute infection with septic arthritis. Associated intra-articular fluid collection at the left aspect of the C1-2 articulation measures up to 2.1 cm (series 21, image 9). No significant marrow edema within the adjacent osseous structures to suggest associated osteomyelitis.   Communicated findings with Dr. Lawence who consulted with neurosurgery (Dr Rosslyn).  NS will see pt in the morning. Will keep pt on an aspen collar for now until evaluated by NS.  No acute neurochanges reported by nursing staff.   Devean Skoczylas, DNP, ACNPC- AG [facility]

## 2024-04-24 NOTE — Progress Notes (Signed)
 PROGRESS NOTE   [name] [name]  FMW:[id number]    DOB: 23-Aug-1975    DOA: 04/14/2024  PCP: Bertell Satterfield, MD   I have briefly reviewed patients previous medical records in [facility].   Brief Hospital Course:  48 year old male with history of drug use in the past, prior MSSA pneumonia and MSSA bacteremia, lumbar osteomyelitis admitted 10/2-10/13 2025 (left AMA) who came back to the hospital on 11/2 with altered mental status, covered in feces, poor airway protection and had to be intubated. He was transferred to [facility] and admitted to the ICU. He was found to have recurrent MSSA bacteremia, tricuspid valve endocarditis, ID was consulted. He has improved, he was eventually extubated and transferred to the hospitalist service on 11/11.    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  Imaging this hospitalization showed septic pulmonary emboli, an MRI done during prior hospitalization on 10/3 showed discitis/osteomyelitis L3-L4, L5-S1 with a septic right facet joint at L5-S1.  There was also paraspinal inflammation with small bilateral psoas abscesses - 2D echo as above with tricuspid valve mobile masses - Cardiothoracic surgery evaluated patient, recommending medical management and no role for angio vac - ID consulted, currently on Ancef  with plans for probably 6 weeks 11/11, MRI C-spine with and without contrast: Acute septic arthritis involving the atlantodental articulation at the skull base.  Superimposed 2.1 cm intra-articular fluid collection at the left aspect of the atlantodental articulation. 11/11, MRI thoracic spine with and without contrast: Acute osteomyelitis, discitis at T5-6.  Consolidative opacities within the partially visualized lungs  concerning for multifocal pneumonia. 11/11, MRI lumbar spine without and with contrast: Persistent findings of OM, discitis at L3-4 and L5-S1, mildly progressed from MRI 10/3.  Paraspinous inflammatory changes with superimposed left psoas abscess.  No significant epidural involvement.  Septic arthritis right L5-S1 facet progressed from prior.  Associated 2 cm soft tissue abscess within the adjacent posterior paraspinal soft tissues.  Septic arthritis involving left L3-4 facet, associated 1.1 cm soft tissue abscess within the adjacent posterior paraspinal soft tissues. MRI of C, T and L-spine 11/11 shows extensive findings as noted above including osteomyelitis, discitis, abscesses at various levels.  Neurosurgery was consulted last night and input pending.  Currently has Aspen collar until neurosurgery evaluation.  Will also request ID follow-up.    Acute hypoxic respiratory failure -intubated initially, extubated 11/9, now on room air.  Continue antibiotics as above for MSSA pneumonia Aggressive pulmonary toilet.  Added Mucinex  for chest congestion.   Acute metabolic encephalopathy -in the setting of severe sepsis, resolved and he is at baseline   History of substance abuse - he told prior [facility] MD he has been clean for several months, has not done any drugs, not drinking, but smoking cigarettes still.   Acute kidney injury  -likely in the setting of severe sepsis, creatinine on admission at 4.0, currently at 1.6 and stable over the last 3 days.  Has a degree of ATN from his sepsis picture, will continue to monitor as he may have a degree of chronic kidney disease moving forward No labs for today.  Will request BMP for tomorrow.   Hyperkalemia -resolved   Hypernatremia -resolved   Elevated LFTs -due to sepsis, shock liver, no intra-abdominal abnormality on CT   Anemia, thrombocytopenia -counts improving.  Ordered CBC for tomorrow.   History of hypertension -blood pressure is stable    History of anxiety -Home meds on hold, was on Precedex in the ICU.  Currently appears stable   Stage 2 pressure ulcer sacrum, present on admission  -Continue wound care.  Appreciate consultation  Body mass index is 20.57 kg/m.   DVT prophylaxis: heparin  injection 5,000 Units Start: 04/20/24 1400 SCDs Start: 04/14/24 2038     Code Status: Full Code:  Family Communication: None at bedside Disposition:  Status is: Inpatient Remains inpatient appropriate because: IV Abx, awaiting Neurosx input     Consultants:   Cardiothoracic surgery Infectious disease  Procedures:   As above  Subjective:  Seen this morning along with patient's RN at bedside.  Reports chest congestion.  Neck pain but better after cervical collar.  Weakness of all 4 extremities, reports due to pain and weakness.  Objective:   Vitals:   04/23/24 1934 04/23/24 2349 04/24/24 0332 04/24/24 0802  BP: (!) 148/95 (!) 142/96 130/87   Pulse: 99 84 94 71  Resp:   20 18  Temp: 99.2 F (37.3 C) 98.6 F (37 C) 98 F (36.7 C) 97.6 F (36.4 C)  TempSrc: Oral Axillary Oral Oral  SpO2: 96% 98% 100%   Weight:      Height:        General exam: Young male, moderately built and frail, chronically ill looking lying comfortably propped up in bed without distress Neck: Has Aspen neck collar Respiratory system: Clear to auscultation. Respiratory effort normal. Cardiovascular system: S1 & S2 heard, RRR. No JVD, murmurs, rubs, gallops or clicks. No pedal edema.  Telemetry personally reviewed at bedside: Sinus rhythm. Gastrointestinal system: Abdomen is nondistended, soft and nontender. No organomegaly or masses felt. Normal bowel sounds heard. Central nervous system: Alert and oriented. No focal neurological deficits. Extremities: Quadriparetic with grade at least 3 x 5 power in all limbs and 4 x 5 power in left upper extremity. Skin: No rashes, lesions or ulcers Psychiatry: Judgement and insight appear normal. Mood &  affect appropriate.     Data Reviewed:   I have personally reviewed following labs and imaging studies   CBC: Recent Labs  Lab 04/21/24 0944 04/22/24 0628 04/23/24 0509  WBC 12.5* 15.5* 19.8*  HGB 7.2* 7.5* 10.4*  HCT 24.6* 25.1* 34.9*  MCV 99.6 98.4 100.0  PLT 135* 194 217    Basic Metabolic Panel: Recent Labs  Lab 04/18/24 0157 04/19/24 0507 04/20/24 0511 04/21/24 0943 04/22/24 0627 04/23/24 0509  NA 149* 154* 152* 151* 144 145  K 3.8 3.8 3.5 3.9 3.6 4.6  CL 110 115* 114* 115* 109 111  CO2 25 25 24 26 24  19*  GLUCOSE 169* 126* 131* 133* 105* 87  BUN 94* 90* 92* 88* 73* 71*  CREATININE 3.13* 2.50* 2.04* 1.60* 1.45* 1.62*  CALCIUM 7.0* 7.8* 7.8* 7.8* 7.9* 8.2*  MG 2.2 2.3 2.0  --   --   --   PHOS  --   --  5.3* 4.2 3.9 4.2    Liver Function Tests: Recent Labs  Lab 04/21/24 0943 04/22/24 0627 04/23/24 0509  ALBUMIN 1.7* 1.6* 1.8*    CBG: Recent Labs  Lab 04/23/24 1625 04/23/24 2115 04/24/24 0821  GLUCAP 116* 120* 92    Microbiology Studies:   Recent Results (from the past 240 hours)  Blood Culture (routine x 2)     Status: Abnormal   Collection Time: 04/14/24  3:31 PM   Specimen:  Blood  Result Value Ref Range Status   Specimen Description   Final    BLOOD LEFT ANTECUBITAL Performed at [facility], [address], [place], [place] [address]    Special Requests   Final    BOTTLES DRAWN AEROBIC AND ANAEROBIC Blood Culture adequate volume Performed at [facility], [address] [place], [place] [address]    Culture  Setup Time   Final    GRAM POSITIVE COCCI IN BOTH AEROBIC AND ANAEROBIC BOTTLES CRITICAL RESULT CALLED TO, READ BACK BY AND VERIFIED WITH: PRUITT,G ON 04/15/24 AT 0102 BY PURDIE,J CRITICAL RESULT CALLED TO, READ BACK BY AND VERIFIED WITH: PHARMD M.MICHELLE AT 0743N 04/15/2024 BY T.SAAD. Performed at [facility], [address] [place], [place] [address]    Culture STAPHYLOCOCCUS AUREUS (A)  Final   Report Status  04/17/2024 FINAL  Final   Organism ID, Bacteria STAPHYLOCOCCUS AUREUS  Final      Susceptibility   Staphylococcus aureus - MIC*    CIPROFLOXACIN  <=0.5 SENSITIVE Sensitive     ERYTHROMYCIN <=0.25 SENSITIVE Sensitive     GENTAMICIN <=0.5 SENSITIVE Sensitive     OXACILLIN <=0.25 SENSITIVE Sensitive     TETRACYCLINE <=1 SENSITIVE Sensitive     VANCOMYCIN  1 SENSITIVE Sensitive     TRIMETH /SULFA  <=10 SENSITIVE Sensitive     CLINDAMYCIN  <=0.25 SENSITIVE Sensitive     RIFAMPIN <=0.5 SENSITIVE Sensitive     Inducible Clindamycin  NEGATIVE Sensitive     LINEZOLID  2 SENSITIVE Sensitive     * STAPHYLOCOCCUS AUREUS  Blood Culture ID Panel (Reflexed)     Status: Abnormal   Collection Time: 04/14/24  3:31 PM  Result Value Ref Range Status   Enterococcus faecalis NOT DETECTED NOT DETECTED Final   Enterococcus Faecium NOT DETECTED NOT DETECTED Final   Listeria monocytogenes NOT DETECTED NOT DETECTED Final   Staphylococcus species DETECTED (A) NOT DETECTED Final    Comment: CRITICAL RESULT CALLED TO, READ BACK BY AND VERIFIED WITH: PHARMD M.MICHELLE AT 0743N 04/15/2024 BY T.SAAD.    Staphylococcus aureus (BCID) DETECTED (A) NOT DETECTED Final    Comment: CRITICAL RESULT CALLED TO, READ BACK BY AND VERIFIED WITH: PHARMD M.MICHELLE AT 0743N 04/15/2024 BY T.SAAD.    Staphylococcus epidermidis NOT DETECTED NOT DETECTED Final   Staphylococcus lugdunensis NOT DETECTED NOT DETECTED Final   Streptococcus species NOT DETECTED NOT DETECTED Final   Streptococcus agalactiae NOT DETECTED NOT DETECTED Final   Streptococcus pneumoniae NOT DETECTED NOT DETECTED Final   Streptococcus pyogenes NOT DETECTED NOT DETECTED Final   A.calcoaceticus-baumannii NOT DETECTED NOT DETECTED Final   Bacteroides fragilis NOT DETECTED NOT DETECTED Final   Enterobacterales NOT DETECTED NOT DETECTED Final   Enterobacter cloacae complex NOT DETECTED NOT DETECTED Final   Escherichia coli NOT DETECTED NOT DETECTED Final   Klebsiella  aerogenes NOT DETECTED NOT DETECTED Final   Klebsiella oxytoca NOT DETECTED NOT DETECTED Final   Klebsiella pneumoniae NOT DETECTED NOT DETECTED Final   Proteus species NOT DETECTED NOT DETECTED Final   Salmonella species NOT DETECTED NOT DETECTED Final   Serratia marcescens NOT DETECTED NOT DETECTED Final   Haemophilus influenzae NOT DETECTED NOT DETECTED Final   Neisseria meningitidis NOT DETECTED NOT DETECTED Final   Pseudomonas aeruginosa NOT DETECTED NOT DETECTED Final   Stenotrophomonas maltophilia NOT DETECTED NOT DETECTED Final   Candida albicans NOT DETECTED NOT DETECTED Final   Candida auris NOT DETECTED NOT DETECTED Final   Candida glabrata NOT DETECTED NOT DETECTED Final   Candida krusei  NOT DETECTED NOT DETECTED Final   Candida parapsilosis NOT DETECTED NOT DETECTED Final   Candida tropicalis NOT DETECTED NOT DETECTED Final   Cryptococcus neoformans/gattii NOT DETECTED NOT DETECTED Final   Meth resistant mecA/C and MREJ NOT DETECTED NOT DETECTED Final    Comment: Performed at [facility], [address] [place], [place] [address]  Resp panel by RT-PCR (RSV, Flu A&B, Covid) Anterior Nasal Swab     Status: None   Collection Time: 04/14/24  4:07 PM   Specimen: Anterior Nasal Swab  Result Value Ref Range Status   SARS Coronavirus 2 by RT PCR NEGATIVE NEGATIVE Final    Comment: (NOTE) SARS-CoV-2 target nucleic acids are NOT DETECTED.  The SARS-CoV-2 RNA is generally detectable in upper respiratory specimens during the acute phase of infection. The lowest concentration of SARS-CoV-2 viral copies this assay can detect is 138 copies/mL. A negative result does not preclude SARS-Cov-2 infection and should not be used as the sole basis for treatment or other patient management decisions. A negative result may occur with  improper specimen collection/handling, submission of specimen other than nasopharyngeal swab, presence of viral mutation(s) within the areas targeted by  this assay, and inadequate number of viral copies(<138 copies/mL). A negative result must be combined with clinical observations, patient history, and epidemiological information. The expected result is Negative.  Fact Sheet for Patients:  bloggercourse.com  Fact Sheet for Healthcare Providers:  seriousbroker.it  This test is no t yet approved or cleared by the [place]  FDA and  has been authorized for detection and/or diagnosis of SARS-CoV-2 by FDA under an Emergency Use Authorization (EUA). This EUA will remain  in effect (meaning this test can be used) for the duration of the COVID-19 declaration under Section 564(b)(1) of the Act, 21 U.S.C.section 360bbb-3(b)(1), unless the authorization is terminated  or revoked sooner.       Influenza A by PCR NEGATIVE NEGATIVE Final   Influenza B by PCR NEGATIVE NEGATIVE Final    Comment: (NOTE) The Xpert Xpress SARS-CoV-2/FLU/RSV plus assay is intended as an aid in the diagnosis of influenza from Nasopharyngeal swab specimens and should not be used as a sole basis for treatment. Nasal washings and aspirates are unacceptable for Xpert Xpress SARS-CoV-2/FLU/RSV testing.  Fact Sheet for Patients: bloggercourse.com  Fact Sheet for Healthcare Providers: seriousbroker.it  This test is not yet approved or cleared by the [place]  FDA and has been authorized for detection and/or diagnosis of SARS-CoV-2 by FDA under an Emergency Use Authorization (EUA). This EUA will remain in effect (meaning this test can be used) for the duration of the COVID-19 declaration under Section 564(b)(1) of the Act, 21 U.S.C. section 360bbb-3(b)(1), unless the authorization is terminated or revoked.     Resp Syncytial Virus by PCR NEGATIVE NEGATIVE Final    Comment: (NOTE) Fact Sheet for Patients: bloggercourse.com  Fact Sheet  for Healthcare Providers: seriousbroker.it  This test is not yet approved or cleared by the [place]  FDA and has been authorized for detection and/or diagnosis of SARS-CoV-2 by FDA under an Emergency Use Authorization (EUA). This EUA will remain in effect (meaning this test can be used) for the duration of the COVID-19 declaration under Section 564(b)(1) of the Act, 21 U.S.C. section 360bbb-3(b)(1), unless the authorization is terminated or revoked.  Performed at [facility], [address], [place], [place] [address]   Urine Culture     Status: Abnormal   Collection Time: 04/14/24  4:07 PM   Specimen:  Urine, Random  Result Value Ref Range Status   Specimen Description   Final    URINE, RANDOM Performed at [facility], [address] [place], [place] [address]    Special Requests   Final    NONE Reflexed from [id number] Performed at [facility], [address], [place], [place] [address]    Culture 60,000 COLONIES/mL STAPHYLOCOCCUS AUREUS (A)  Final   Report Status 04/16/2024 FINAL  Final   Organism ID, Bacteria STAPHYLOCOCCUS AUREUS (A)  Final      Susceptibility   Staphylococcus aureus - MIC*    CIPROFLOXACIN  <=0.5 SENSITIVE Sensitive     GENTAMICIN <=0.5 SENSITIVE Sensitive     NITROFURANTOIN <=16 SENSITIVE Sensitive     OXACILLIN <=0.25 SENSITIVE Sensitive     TETRACYCLINE <=1 SENSITIVE Sensitive     VANCOMYCIN  <=0.5 SENSITIVE Sensitive     TRIMETH /SULFA  <=10 SENSITIVE Sensitive     RIFAMPIN <=0.5 SENSITIVE Sensitive     Inducible Clindamycin  NEGATIVE Sensitive     LINEZOLID  2 SENSITIVE Sensitive     * 60,000 COLONIES/mL STAPHYLOCOCCUS AUREUS  Blood Culture (routine x 2)     Status: None   Collection Time: 04/14/24  6:31 PM   Specimen: BLOOD RIGHT WRIST  Result Value Ref Range Status   Specimen Description BLOOD RIGHT WRIST BLOOD  Final   Special Requests AEROBIC BOTTLE ONLY Blood Culture adequate volume  Final   Culture   Final    NO  GROWTH 5 DAYS Performed at [facility], [address] [place], [place] [address]    Report Status 04/19/2024 FINAL  Final  Culture, Respiratory w Gram Stain     Status: None   Collection Time: 04/14/24  6:51 PM   Specimen: Tracheal Aspirate; Respiratory  Result Value Ref Range Status   Specimen Description TRACHEAL ASPIRATE  Final   Special Requests NONE  Final   Gram Stain   Final    RARE WBC PRESENT, PREDOMINANTLY PMN FEW GRAM POSITIVE COCCI FEW GRAM NEGATIVE RODS    Culture   Final    FEW Normal respiratory flora-no Staph aureus or Pseudomonas seen Performed at [facility], [address] [place], [place] [address]    Report Status 04/17/2024 FINAL  Final  MRSA Next Gen by PCR, Nasal     Status: None   Collection Time: 04/14/24  8:39 PM   Specimen: Nasal Mucosa; Nasal Swab  Result Value Ref Range Status   MRSA by PCR Next Gen NOT DETECTED NOT DETECTED Final    Comment: (NOTE) The GeneXpert MRSA Assay (FDA approved for NASAL specimens only), is one component of a comprehensive MRSA colonization surveillance program. It is not intended to diagnose MRSA infection nor to guide or monitor treatment for MRSA infections. Test performance is not FDA approved in patients less than 47 years old. Performed at [facility], [address] [place], [place] [address]   Culture, blood (Routine X 2) w Reflex to ID Panel     Status: None   Collection Time: 04/16/24 10:10 AM   Specimen: BLOOD RIGHT HAND  Result Value Ref Range Status   Specimen Description BLOOD RIGHT HAND  Final   Special Requests   Final    BOTTLES DRAWN AEROBIC ONLY Blood Culture results may not be optimal due to an inadequate volume of blood received in culture bottles   Culture   Final    NO GROWTH 5 DAYS Performed at [facility], [address] [place], [place] [address]    Report Status 04/21/2024 FINAL  Final  Culture, blood (Routine X 2) w Reflex to ID Panel     Status: None    Collection Time: 04/16/24 10:16 AM   Specimen: BLOOD LEFT WRIST  Result Value Ref Range Status   Specimen Description BLOOD LEFT WRIST  Final   Special Requests   Final    BOTTLES DRAWN AEROBIC ONLY Blood Culture results may not be optimal due to an inadequate volume of blood received in culture bottles   Culture   Final    NO GROWTH 5 DAYS Performed at [facility], [address] [place], [place] [address]    Report Status 04/21/2024 FINAL  Final    Radiology Studies:  MR CERVICAL SPINE W WO CONTRAST Addendum Date: 04/24/2024 ADDENDUM REPORT: 04/24/2024 02:22 ADDENDUM: Findings regarding the skull base infection and potential resultant craniocervical instability discussed by telephone with the nurse practitioner covering for the patient, Stefano Horns at 1:50 a.m. on 04/24/2024. Neurosurgical consultation was recommended. Electronically Signed   By: Morene Hoard M.D.   On: 04/24/2024 02:22   Result Date: 04/24/2024 CLINICAL DATA:  Initial evaluation for bacteremia, acute neck pain. EXAM: MRI CERVICAL SPINE WITHOUT AND WITH CONTRAST TECHNIQUE: Multiplanar and multiecho pulse sequences of the cervical spine, to include the craniocervical junction and cervicothoracic junction, were obtained without and with intravenous contrast. CONTRAST:  7mL GADAVIST GADOBUTROL 1 MMOL/ML IV SOLN COMPARISON:  None Available. FINDINGS: Alignment: Examination degraded by motion artifact. Straightening with slight reversal of the normal cervical lordosis. No listhesis. Vertebrae: Abnormal widening of the atlantodental interval which measures up to 10 mm in diameter. Enhancing soft tissue within the expanded atlantodental space, extending towards the basion. The basion dens interval is also abnormally widened up to 16 mm. Secondary mild basilar invagination of the dens into the ventral brainstem. Finding concerning for acute infection with septic arthritis. Associated intra-articular fluid collection at  the left aspect of the C1-2 articulation measures up to 2.1 cm (series 21, image 9). No significant marrow edema within the adjacent osseous structures to suggest associated osteomyelitis. No other evidence for acute infection elsewhere within the cervical spine. Vertebral body height maintained without acute or chronic fracture. Decreased T1 signal intensity noted throughout the visualized bone marrow, nonspecific, but most commonly related to anemia, smoking or obesity. No worrisome osseous lesions. No other abnormal marrow edema or enhancement. Cord: Normal signal and morphology. Mild epidural enhancement adjacent to the infected C1-2 level, but no epidural abscess or collection. Posterior Fossa, vertebral arteries, paraspinal tissues: Visualized brain and posterior fossa otherwise unremarkable. Craniocervical junction remains patent. Edema and enhancement at the skull base about the infected atlantodental articulation. Paraspinous soft tissues demonstrate no other acute finding. Normal flow voids seen within the vertebral arteries bilaterally. Disc levels: No significant disc pathology seen within the cervical spine for age. No significant disc bulge or focal disc herniation. No significant facet disease. No canal or neural foraminal stenosis. No neural impingement. IMPRESSION: 1. Findings consistent with acute septic arthritis involving the atlantodental articulation at the skull base. Secondary abnormal widening of the atlantodental and basion-dens intervals with mild basilar invagination as above. Superimposed 2.1 cm intra-articular fluid collection at the left aspect of the atlantodental articulation. 2. No other evidence for acute infection elsewhere within the cervical spine. 3. No significant disc pathology for age. No stenosis or neural impingement. Electronically Signed: By: Morene Hoard M.D. On: 04/24/2024 01:23   MR Lumbar Spine  W Wo Contrast Result Date: 04/24/2024 CLINICAL DATA:  Initial  evaluation for acute back pain, bacteremia. EXAM: MRI LUMBAR SPINE WITHOUT AND WITH CONTRAST TECHNIQUE: Multiplanar and multiecho pulse sequences of the lumbar spine were obtained without and with intravenous contrast. CONTRAST:  7mL GADAVIST GADOBUTROL 1 MMOL/ML IV SOLN COMPARISON:  Comparison made with prior MRI from 03/15/2024. FINDINGS: Segmentation: Transitional features about the lumbosacral junction. For the purposes of this dictation, the same numbering system utilized on prior exam is employed on this exam. Therefore, there is a partially lumbarized S1 segment with rudimentary S1-2 interspace. Alignment: Mild levoscoliosis. Alignment otherwise normal preservation of the normal lumbar lordosis. Trace retrolisthesis of L3 on L4, stable. Vertebrae: Persistent findings of osteomyelitis discitis at L3-4. There has been progressive endplate irregularity and fragmentation, with slight vertebral body height loss since previous. Mild epidural enhancement within the adjacent ventral epidural space without discrete epidural abscess. Associated paraspinous inflammatory changes involving the left greater than right psoas musculature. Superimposed abscess within the left psoas measures approximately 2.1 x 1.7 x 8.3 cm (series 8, image 20). Persistent findings of osteomyelitis discitis at L5-S1, greater on the right. Mildly progressive endplate irregularity and enhancement without significant interval vertebral collapse. No significant epidural involvement at this level. Edema and enhancement about the right L5-S1 facet, consistent with ongoing sub septic arthritis, progressed from prior. Associated loculated soft tissue abscess just posterior to the infected right L5-S1 facet measures 1.9 x 1.4 x 2.0 cm (series 12, image 24). Abnormal edema and enhancement also seen about the left L3-4 facet, also consistent with septic arthritis, present on prior exam in retrospect. Associated 1.1 cm soft tissue abscess within the  adjacent posterior paraspinous soft tissues (series 12, image 12). No other new sites of infection elsewhere within the lumbar spine. Vertebral body height otherwise maintained. Diffusely decreased T1 signal intensity throughout the visualized bone marrow, nonspecific, but most commonly related to anemia, smoking, or obesity. No worrisome osseous lesions. Conus medullaris and cauda equina: Conus extends to the L1-2 level. Conus and cauda equina appear normal. Paraspinal and other soft tissues: Paraspinous inflammatory changes as detailed above. Disc levels: L1-2:  Unremarkable. L2-3: Disc desiccation with mild disc bulge. No significant spinal stenosis. Foramina remain patent. L3-4: Findings related to osteomyelitis discitis with associated intervertebral disc space height loss, endplate irregularity, and spurring. Underlying diffuse disc bulge, asymmetric to the left. Mild bilateral facet hypertrophy. No significant spinal stenosis. Moderate bilateral L3 foraminal narrowing. L4-5: Disc desiccation with minimal annular disc bulge. Superimposed right foraminal disc protrusion closely approximates the right L4 nerve root. Mild left facet hypertrophy. No significant spinal stenosis. Foramina remain patent. L5-S1: Ongoing findings of osteomyelitis discitis. Underlying mild disc bulge with endplate spurring. Right-sided septic arthritis with underlying facet degeneration. No significant spinal stenosis. Moderate bilateral L5 foraminal narrowing. S1-2: Rudimentary S1-2 interspace. No disc bulge or focal disc herniation. Minimal facet degeneration. No stenosis. IMPRESSION: 1. Persistent findings of osteomyelitis discitis at L3-4 and L5-S1, mildly progressed as compared to previous MRI from 03/15/2024. Associated paraspinous inflammatory changes with superimposed left psoas abscess as above. No significant epidural involvement at this time. 2. Septic arthritis involving the right L5-S1 facet, progressed from prior.  Associated 2 cm soft tissue abscess within the adjacent posterior paraspinous soft tissues. 3. Septic arthritis involving the left L3-4 facet, present on prior exam in retrospect. Associated 1.1 cm soft tissue abscess within the adjacent posterior paraspinous soft tissues. 4. Underlying multilevel degenerative spondylosis and facet arthrosis as above. No significant spinal stenosis.  Moderate bilateral L3 and L5 foraminal narrowing. 5. Transitional lumbosacral anatomy. Careful correlation with numbering system on this exam recommended prior to any potential future intervention. Electronically Signed   By: Morene Hoard M.D.   On: 04/24/2024 02:18   MR THORACIC SPINE W WO CONTRAST Result Date: 04/24/2024 CLINICAL DATA:  Initial evaluation for bacteremia, acute back pain. EXAM: MRI THORACIC WITHOUT AND WITH CONTRAST TECHNIQUE: Multiplanar and multiecho pulse sequences of the thoracic spine were obtained without and with intravenous contrast. CONTRAST:  7mL GADAVIST GADOBUTROL 1 MMOL/ML IV SOLN COMPARISON:  Comparison made with CT from 04/14/2024. FINDINGS: Alignment:  Examination degraded by motion artifact. Mild dextroscoliosis. Alignment otherwise normal preservation of the normal thoracic kyphosis. No listhesis. Vertebrae: Vertebral body height maintained without acute or chronic fracture. Diffusely decreased T1 signal intensity noted throughout the visualized bone marrow, nonspecific, but most commonly related to anemia, smoking or obesity. No worrisome osseous lesions. Intervertebral disc space height loss with fluid signal intensity within the T5-6 interspace. Marrow edema and enhancement within the adjacent endplates with mild endplate irregularity. Findings consistent with acute osteomyelitis discitis at this level. No significant epidural or paraspinous involvement. No other evidence for acute infection elsewhere within the thoracic spine. Cord: Grossly normal signal and morphology. No visible cord  signal changes on this motion degraded exam. No epidural abscess. No abnormal enhancement. Paraspinal and other soft tissues: Mild diffuse edema within the visualized paraspinous soft tissues, likely related to overall volume status. No loculated collections. Small layering left pleural effusion. Consolidative opacity within the partially visualized lungs, concerning for multifocal pneumonia. Disc levels: T6-7: Small left paracentral disc protrusion without stenosis. T8-9: Small left paracentral disc protrusion without stenosis. Otherwise, no other significant disc pathology seen within the thoracic spine. No stenosis or IMPRESSION: 1. Findings consistent with acute osteomyelitis discitis at T5-6. No significant epidural or paraspinous involvement at this time. 2. Consolidative opacities within the partially visualized lungs, concerning for multifocal pneumonia. Associated small left pleural effusion. 3. Small disc protrusions at T6-7 and T8-9 without stenosis or impingement. Electronically Signed   By: Morene Hoard M.D.   On: 04/24/2024 01:43   DG CHEST PORT 1 VIEW Result Date: 04/23/2024 CLINICAL DATA:  Dyspnea. EXAM: PORTABLE CHEST 1 VIEW COMPARISON:  04/19/2024 FINDINGS: The cardio pericardial silhouette is enlarged. Interval progression of patchy airspace disease in the left lung, now with confluent areas in the left mid and retrocardiac left base. Subtle patchy airspace disease again noted right lung. No substantial pleural effusion. No acute bony abnormality. IMPRESSION: Interval progression of patchy airspace disease in the left lung, now with confluent areas in the left mid lung and retrocardiac left base. Electronically Signed   By: Camellia Candle M.D.   On: 04/23/2024 12:16    Scheduled Meds:    ALPRAZolam   1 mg Oral Q8H   Chlorhexidine Gluconate Cloth  6 each Topical Daily   cloNIDine  0.1 mg Oral TID   feeding supplement  237 mL Oral BID BM   folic acid   1 mg Oral Daily   heparin   injection (subcutaneous)  5,000 Units Subcutaneous Q8H   insulin aspart  2-6 Units Subcutaneous TID AC & HS   multivitamin with minerals  1 tablet Oral Daily   nutrition supplement (JUVEN)  1 packet Oral BID BM   oxyCODONE   5 mg Oral Q6H    Continuous Infusions:     ceFAZolin  (ANCEF ) IV 2 g (04/24/24 0524)   dexmedetomidine (PRECEDEX) IV infusion Stopped (04/22/24 1320)  LOS: 10 days     Trenda Mar, MD,  FACP, [facility], [facility], [facility]   [facility] [place]      To contact the attending provider between 7A-7P or the covering provider during after hours 7P-7A, please log into the web site [email or web address] and access using universal [place] password for that web site. If you do not have the password, please call the hospital operator.  04/24/2024, 10:11 AM

## 2024-04-24 NOTE — Progress Notes (Signed)
 Pharmacy Note- Penicillin Allergy Clarification   ASSESSMENT: 48 year old male with reported penicillin allergy. He has no recollection of this allergy or how it appeared in his chart. In addition it appears he tolerated Unasyn when admitted to the hospital in his 15s.    PEN-FAST Scoring   Five years or less since last reaction 0  Anaphylaxis/Angioedema OR Severe cutaneous adverse reaction  0  Treatment required for reaction  0  Total Score 0 points - Very low risk of positive penicillin allergy test (<1%)      Type of intervention (select all that apply):  Direct IV therapy  Impact on therapy (select all that apply):  Penicillin allergy removed   PLAN: Unasyn 3 gm IV Q  6 hours   POST-CHALLENGE FOLLOW-UP: No immediate reaction to Unasyn. Will continue to monitor for rash.     Damien Quiet, PharmD, BCPS, BCIDP Infectious Diseases Clinical Pharmacist Phone: [phone] 04/24/2024 12:52 PM

## 2024-04-24 NOTE — Progress Notes (Signed)
   04/24/24 1143  Assess: MEWS Score  Temp 98.5 F (36.9 C)  BP (!) 149/99  MAP (mmHg) 114  Pulse Rate (!) 118  ECG Heart Rate (!) 113  Resp 20  Level of Consciousness Alert  SpO2 92 %  O2 Device Room Air    Physician notified of yellow MEWS. Will continue to monitor.

## 2024-04-24 NOTE — Progress Notes (Signed)
 PT Cancellation Note  Patient Details Name: [name] [name] MRN: [id number] DOB: [birth year]   Cancelled Treatment:    Reason Eval/Treat Not Completed: Medical issues which prohibited therapy. PT awaiting neurosurgery recommendations after MRI findings of multi-level osteomyelitis, discitis and abscesses.    [name] 04/24/2024, 11:22 AM

## 2024-04-24 NOTE — Progress Notes (Signed)
 Patient taken off unit by transport for CT scan.

## 2024-04-24 NOTE — Progress Notes (Signed)
 Orthopedic Tech Progress Note Patient Details:  [name] [name] [birth year] [id number]  Ortho Devices Type of Ortho Device: Aspen cervical collar Ortho Device/Splint Interventions: Ordered    Dropped off aspen collar per request from RN.   Morna Pink 04/24/2024, 5:29 AM

## 2024-04-24 NOTE — Consult Note (Addendum)
 Neurosurgery Consult Note  Assessment:  48 y/o M w/ hx substance abuse, known L3-4 disc/osteo treated nonoperatively found to have MSSA bacteremia, infective endocarditis, septic pulmonary emboli. MRI's show slight progression of L3-4 disc/osteo and C1-2 atlantodental septic arthritis with widened ADI  Plan:  Lspine: recommend non-op treatment with antibiotics. No surgery indicated. If patient has low back pain when upright, can order an LSO brace for his comfort Cspine: not currently a surgical candidate, but may need surgery in the future. Recommend ordering a [place] J collar. Will follow-up in 6 weeks. CT cervical spine reviewed     CC: fatigue  HPI:     Patient is a 48 y.o. male w/ substance abuse, hx known lumbar disc osteo (treated nonoperatively)  who was found to have with MSSA bacteremia and infective endocarditis with septic pulmonary emboli. His lumbar disc osteo at L3-4 is mildly progressed compared to 1 month ago. His MRI Cspine shows widening of the atlantodental interval consistent with septic arthritis. He currently denies neck pain, focal motor/sensory deficit. He also has a sacral pressure ulcer   Patient Active Problem List   Diagnosis Date Noted   Sepsis (HCC) 04/14/2024   Severe sepsis (HCC) 04/14/2024   Protein-calorie malnutrition, severe 03/22/2024   Septic embolism (HCC) 03/15/2024   Pressure injury of skin 03/15/2024   Lactic acidosis 03/15/2024   Osteomyelitis of lumbar spine (HCC) 03/15/2024   Thrombocytopenia 03/15/2024   Transaminasemia 03/15/2024   Hypoalbuminemia 03/15/2024   Failure to thrive in adult 03/15/2024   Sepsis due to pneumonia (HCC) 11/07/2016   Leukopenia 11/07/2016   Acute respiratory failure with hypoxia (HCC) 11/07/2016   Anxiety disorder 11/07/2016   Hyperbilirubinemia 11/07/2016   Fever 01/18/2014   MSSA bacteremia 01/18/2014   CAP (community acquired pneumonia) 05/19/2013   Sinus tachycardia 05/19/2013   Tobacco abuse  05/19/2013   Past Medical History:  Diagnosis Date   Anxiety    Back pain    Fracture of rib of right side 01/26/2015   Hypertension     Past Surgical History:  Procedure Laterality Date   TEE WITHOUT CARDIOVERSION N/A 01/21/2014   Procedure: TRANSESOPHAGEAL ECHOCARDIOGRAM (TEE) with propofol ;  Surgeon: Dorn JULIANNA Ross, MD;  Location: [facility];  Service: Endoscopy;  Laterality: N/A;    Medications Prior to Admission  Medication Sig Dispense Refill Last Dose/Taking   acetaminophen  (TYLENOL ) 500 MG tablet Take 500 mg by mouth every 6 (six) hours as needed for mild pain (pain score 1-3).   Past Week   ALPRAZolam  (XANAX ) 1 MG tablet Take 1 mg by mouth in the morning, at noon, in the evening, and at bedtime.   04/11/2024   lidocaine  (LMX) 4 % cream Apply 1 Application topically as needed (pain).   Past Week   naproxen  sodium (ALEVE ) 220 MG tablet Take 220 mg by mouth daily as needed (pain).   Past Week   Oxycodone  HCl 10 MG TABS Take 1 tablet by mouth in the morning, at noon, in the evening, and at bedtime.   04/11/2024   Allergies  Allergen Reactions   Penicillins Other (See Comments)    Patient does not recall this allergy and previously received unasyn    Social History   Tobacco Use   Smoking status: Every Day    Current packs/day: 0.50    Types: Cigarettes   Smokeless tobacco: Never  Substance Use Topics   Alcohol use: No    Comment: rare    No family history on file.  Review of Systems Pertinent items are noted in HPI.  Objective:   Patient Vitals for the past 8 hrs:  BP Temp Temp src Pulse Resp SpO2  04/24/24 1638 (!) 148/88 99.6 F (37.6 C) Oral (!) 104 18 91 %  04/24/24 1500 (!) 149/102 -- -- (!) 105 -- 92 %  04/24/24 1420 120/86 99.1 F (37.3 C) Oral (!) 104 20 99 %  04/24/24 1143 (!) 149/99 98.5 F (36.9 C) Axillary (!) 118 20 92 %   I/O last 3 completed shifts: In: -  Out: 2300 [Urine:2300] Total I/O In: 240 [P.O.:240] Out: 700  [Urine:700]    Exam: RUE: 5/5 deltoid, 5/5 bicep, 5/5 wrist extension, 5/5 tricep, 5/5 grip LUE: 5/5 deltoid, 5/5 bicep, 5/5 wrist extension, 5/5 tricep, 5/5 grip RLE: 5/5 IP, 5/5 quad, 5/5 ham, 5/5 DF, 5/5 EHL, 5/5 PF LLE: 5/5 IP, 5/5 quad, 5/5 ham, 5/5 DF, 5/5 EHL, 5/5 PF Sensation to light intact      Data ReviewCBC:  Lab Results  Component Value Date   WBC 16.1 (H) 04/24/2024   RBC 3.24 (L) 04/24/2024   BMP:  Lab Results  Component Value Date   GLUCOSE 128 (H) 04/24/2024   CO2 23 04/24/2024   BUN 66 (H) 04/24/2024   CREATININE 1.65 (H) 04/24/2024   CALCIUM 7.8 (L) 04/24/2024

## 2024-04-24 NOTE — Discharge Instructions (Addendum)
 You may take your cervical collar off when showering/bathing. Otherwise we recommend you wear your collar at all times. Please call to arrange an appointment in Dr. Shelbie office   Dear [name] [name],   Congratulations for your interest in quitting smoking!  Find a program that suits you best: when you want to quit, how you need support, where you live, and how you like to learn.    If youre ready to get started TODAY, consider scheduling a visit through [email or web address].  Appointments are available from 8am to 8pm, Monday to Friday.   Most health insurance plans will cover some level of tobacco cessation visits and medications.    Additional Resources: [employer] are also available to help you quit & provide the support youll need. Many programs are available in both English and Spanish and have a long history of successfully helping people get off and stay off tobacco.    Quit Smoking Apps:  quitSTART at seriousbroker.de QuitGuide?at forgetparking.dk Online education and resources: Smokefree  at borders group.gov Free Telephone Coaching: QuitNow,  Call 1-800-QUIT-NOW (832-806-8029) or Text- Ready to (810)133-5743 *Quitline [place] has teamed up with Medicaid to offer a free 14 week program    Vaping- Want to Quit? Free 24/7 support. Call [facility]  [place], [place], [place], [place], [place]  [facility]

## 2024-04-24 NOTE — Plan of Care (Signed)
  Problem: Education: Goal: Knowledge of General Education information will improve Description: Including pain rating scale, medication(s)/side effects and non-pharmacologic comfort measures Outcome: Progressing   Problem: Clinical Measurements: Goal: Ability to maintain clinical measurements within normal limits will improve Outcome: Progressing Goal: Will remain free from infection Outcome: Progressing Goal: Diagnostic test results will improve Outcome: Progressing Goal: Respiratory complications will improve Outcome: Progressing Goal: Cardiovascular complication will be avoided Outcome: Progressing   Problem: Nutrition: Goal: Adequate nutrition will be maintained Outcome: Progressing   Problem: Coping: Goal: Level of anxiety will decrease Outcome: Progressing   Problem: Elimination: Goal: Will not experience complications related to bowel motility Outcome: Progressing Goal: Will not experience complications related to urinary retention Outcome: Progressing   Problem: Pain Managment: Goal: General experience of comfort will improve and/or be controlled Outcome: Progressing   Problem: Safety: Goal: Ability to remain free from injury will improve Outcome: Progressing   Problem: Safety: Goal: Non-violent Restraint(s) Outcome: Progressing

## 2024-04-24 NOTE — Progress Notes (Signed)
 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 Now tachypneic and with a productive, bloody cough. Imaging showing multifocal pneumonia  Septic arthritis involving the atlantodental articulation at the skull base and a superimposed 2.1 cm intra-articular fluid collection  Acute Osteomyelitis T5-T6  The patient is a 48 year old male who was recently admitted with MSSA pneumonia,  MSSA bacteremia, TV endocarditis, psoas abscesses and lumbar osteomyelitis. He originally presented on 03/14/24-03/25/24 and was found to have multi focal septic lung emboli, small pericaridal effusion, lumbar OM in the setting of MSSA bacteremia but left AMA. He now represents to the ED on 11/2 with AMS and inability to protect his airway requiring intubation. He was empirically started on Vancomycin  and Meropenem. Blood cultures were obtained and are growing MSSA. Urine culture is growing MSSA. CT head showed chronic maxillary sinusitis. Antibiotics were changed to Cefazolin . The patient was noted to have worsening in his respiratory status on November 11th. He is now coughing up thick, brownish/bloody sputum. Repeat MRI of his C-L Spine obtained and his infection is significantly worse. Now in a C collar  - The patient is here with MSSA bacteremia, pneumonia, psaos abscess and lumbar osteomyelitis - Repeat MRI imaging now shows acute septic arthritis involving the atlantodental articulation at the skull base and a superimposed 2.1 cm intra-articular fluid collection at the left aspect of the atlantodental articulation. He also has acute osteomyelitis of T5-T6 and mildly progressive osteomyelitis/discitis of L3-L4, L5-S1. There is progression of his septic arthritis at L3-L4 with a 1.1cm soft tissue abscess.  -  Neurosurgery has been consulted and awaiting recommendations - Given his worsening respiratory status and multifocal pneumonia incidentally seen on MRI, will change Cefazolin  to Unasyn - Patient with a documented Penicillin allergy however, he tolerated Unasyn in his 20s and does not know of any adverse reaction to Penicillin. Will proceed with giving Unasyn - High risk of decompensation   Patient Active Problem List   Diagnosis Date Noted   Sepsis (HCC) 04/14/2024   Severe sepsis (HCC) 04/14/2024   Protein-calorie malnutrition, severe 03/22/2024   Septic embolism (HCC) 03/15/2024   Pressure injury of skin 03/15/2024   Lactic acidosis 03/15/2024   Osteomyelitis of lumbar spine (HCC) 03/15/2024   Thrombocytopenia 03/15/2024   Transaminasemia 03/15/2024   Hypoalbuminemia 03/15/2024   Failure to thrive in adult 03/15/2024   Sepsis due to pneumonia (HCC) 11/07/2016   Leukopenia 11/07/2016   Acute respiratory failure with hypoxia (HCC) 11/07/2016   Anxiety disorder 11/07/2016   Hyperbilirubinemia 11/07/2016   Fever 01/18/2014   MSSA bacteremia 01/18/2014   CAP (community acquired pneumonia) 05/19/2013   Sinus tachycardia 05/19/2013   Tobacco abuse 05/19/2013   Current Discharge Medication List     CONTINUE these medications which have NOT CHANGED   Details  acetaminophen  (TYLENOL ) 500 MG tablet Take 500 mg by mouth every 6 (six) hours as needed for mild pain (pain score 1-3).    ALPRAZolam  (XANAX ) 1 MG tablet Take 1 mg by mouth in the morning, at noon, in the evening, and at bedtime.    lidocaine  (LMX) 4 % cream Apply 1 Application topically as needed (pain).    naproxen  sodium (ALEVE ) 220 MG tablet Take 220 mg by mouth daily as needed (pain).    Oxycodone  HCl 10 MG TABS Take 1 tablet by mouth in the morning, at noon,  in the evening, and at bedtime.       Subjective: The patient is lying in bed in no distress. Ill appearing. Complaining of back pain from his neck down. Still  short of breath and coughing up brown, thick, bloody looking secretions. Remains tachypneic but appears slightly improved in comparison to yesterday. Repeat imaging of his back with significant worsening of his infection.   Review of Systems: Review of Systems  Constitutional:  Negative for chills and fever.  HENT:  Negative for sore throat.   Eyes:  Negative for blurred vision.  Respiratory:  Positive for cough, hemoptysis and shortness of breath.   Cardiovascular:  Negative for chest pain, palpitations and leg swelling.  Gastrointestinal:  Negative for abdominal pain, constipation, diarrhea, nausea and vomiting.  Genitourinary:  Negative for dysuria.  Musculoskeletal:  Positive for back pain.  Skin:  Negative for itching and rash.  Neurological:  Negative for focal weakness and headaches.   Past Medical History:  Diagnosis Date   Anxiety    Back pain    Fracture of rib of right side 01/26/2015   Hypertension    Social History   Tobacco Use   Smoking status: Every Day    Current packs/day: 0.50    Types: Cigarettes   Smokeless tobacco: Never  Substance Use Topics   Alcohol use: No    Comment: rare   Drug use: Yes    Types: Heroin    Comment: reports history of cocaine abuse.-last used years ago   No family history on file.  Allergies  Allergen Reactions   Penicillins Other (See Comments)    Patient does not recall this allergy and previously received unasyn   Objective: Vitals:   04/24/24 0332 04/24/24 0802 04/24/24 1143 04/24/24 1420  BP: 130/87 135/71 (!) 149/99 120/86  Pulse: 94 71 (!) 118 (!) 104  Resp: 20 18 20 20   Temp: 98 F (36.7 C) 97.6 F (36.4 C) 98.5 F (36.9 C) 99.1 F (37.3 C)  TempSrc: Oral Oral Axillary Oral  SpO2: 100%  92% 99%  Weight:      Height:       Body mass index is 20.57 kg/m. General: Thin, acutely ill appearing male lying in bed in mild distress. Disheveled  HENT: Moist mucous membranes, normal nose, normal external ears, and  normocephalic Neck: C collar  Eyes: PERRL, EOMI, non-icteric, and normal conjunctivae and lids Lungs: Tachypneic. Diminished breath sounds bilaterally. Coarse, wet cough noted with brown, bloody secretions  Cardiac: Tachycardic. Regular rhythm. No murmurs, rubs or gallops. Lower extremity edema with pedal edema  Abdomen: Soft, Non distended, Non tender, active bowel sounds Skin: Intact, no focal erythema or rash, and warm and dry GU: Defered genital exam Musculoskeletal: No obvious skeletal abnormalities Neuro: Alert, no focal neurologic deficits. He can move all of his extremities but is complaining of significant back pain  Psych: Cooperative   Problem List Items Addressed This Visit       Other   * (Principal) Sepsis (HCC) - Primary   Other Visit Diagnoses       Altered mental status, unspecified altered mental status type         AKI (acute kidney injury)         Dehydration          Evalene CHRISTELLA Munch, MD [facility] 04/24/2024, 2:34 PM

## 2024-04-25 ENCOUNTER — Inpatient Hospital Stay (HOSPITAL_COMMUNITY): Payer: MEDICAID

## 2024-04-25 DIAGNOSIS — M549 Dorsalgia, unspecified: Secondary | ICD-10-CM

## 2024-04-25 DIAGNOSIS — Z86711 Personal history of pulmonary embolism: Secondary | ICD-10-CM

## 2024-04-25 LAB — CBC
HCT: 34.6 % — ABNORMAL LOW (ref 39.0–52.0)
Hemoglobin: 10.2 g/dL — ABNORMAL LOW (ref 13.0–17.0)
MCH: 29.1 pg (ref 26.0–34.0)
MCHC: 29.5 g/dL — ABNORMAL LOW (ref 30.0–36.0)
MCV: 98.6 fL (ref 80.0–100.0)
Platelets: 245 K/uL (ref 150–400)
RBC: 3.51 MIL/uL — ABNORMAL LOW (ref 4.22–5.81)
RDW: 17.2 % — ABNORMAL HIGH (ref 11.5–15.5)
WBC: 15.2 K/uL — ABNORMAL HIGH (ref 4.0–10.5)
nRBC: 0 % (ref 0.0–0.2)

## 2024-04-25 LAB — HEMOGLOBIN A1C
Hgb A1c MFr Bld: 4.6 % — ABNORMAL LOW (ref 4.8–5.6)
Mean Plasma Glucose: 85.32 mg/dL

## 2024-04-25 LAB — RENAL FUNCTION PANEL
Albumin: 1.8 g/dL — ABNORMAL LOW (ref 3.5–5.0)
Anion gap: 11 (ref 5–15)
BUN: 65 mg/dL — ABNORMAL HIGH (ref 6–20)
CO2: 20 mmol/L — ABNORMAL LOW (ref 22–32)
Calcium: 8.2 mg/dL — ABNORMAL LOW (ref 8.9–10.3)
Chloride: 110 mmol/L (ref 98–111)
Creatinine, Ser: 1.58 mg/dL — ABNORMAL HIGH (ref 0.61–1.24)
GFR, Estimated: 54 mL/min — ABNORMAL LOW (ref >60)
Glucose, Bld: 79 mg/dL (ref 70–99)
Phosphorus: 4.9 mg/dL — ABNORMAL HIGH (ref 2.5–4.6)
Potassium: 4.3 mmol/L (ref 3.5–5.1)
Sodium: 141 mmol/L (ref 135–145)

## 2024-04-25 LAB — GLUCOSE, CAPILLARY
Glucose-Capillary: 98 mg/dL (ref 70–99)
Glucose-Capillary: 93 mg/dL (ref 70–99)
Glucose-Capillary: 100 mg/dL — ABNORMAL HIGH (ref 70–99)
Glucose-Capillary: 141 mg/dL — ABNORMAL HIGH (ref 70–99)

## 2024-04-25 MED ORDER — FUROSEMIDE 10 MG/ML IJ SOLN
60.0000 mg | Freq: Three times a day (TID) | INTRAMUSCULAR | Status: DC
  Administered 2024-04-25 – 2024-04-27 (×6): 60 mg via INTRAVENOUS
  Filled 2024-04-25 (×6): qty 6

## 2024-04-25 MED ORDER — LIDOCAINE 5 % EX PTCH
1.0000 | MEDICATED_PATCH | CUTANEOUS | Status: DC
  Administered 2024-04-25 – 2024-05-21 (×26): 1 via TRANSDERMAL
  Filled 2024-04-25 (×29): qty 1

## 2024-04-25 MED ORDER — METHOCARBAMOL 500 MG PO TABS
500.0000 mg | ORAL_TABLET | Freq: Three times a day (TID) | ORAL | Status: DC
  Administered 2024-04-25 – 2024-04-27 (×7): 500 mg via ORAL
  Filled 2024-04-25 (×8): qty 1

## 2024-04-25 MED ORDER — ALBUMIN HUMAN 25 % IV SOLN
25.0000 g | Freq: Four times a day (QID) | INTRAVENOUS | Status: AC
  Administered 2024-04-25 – 2024-04-26 (×3): 25 g via INTRAVENOUS
  Administered 2024-04-26: 12.5 g via INTRAVENOUS
  Filled 2024-04-25 (×4): qty 100

## 2024-04-25 MED ORDER — GABAPENTIN 100 MG PO CAPS
100.0000 mg | ORAL_CAPSULE | Freq: Two times a day (BID) | ORAL | Status: DC
  Administered 2024-04-25 – 2024-04-27 (×6): 100 mg via ORAL
  Filled 2024-04-25 (×6): qty 1

## 2024-04-25 NOTE — Progress Notes (Signed)
 Physical Therapy Treatment Patient Details Name: [name] [name] MRN: [id number] DOB: 1975-08-28 Today's Date: 04/25/2024   History of Present Illness 48 yo M recently admitted with MSSA PNA MSSA bacteremia psoas abscesses and lumbar osteo 03/14/24-03/25/24 (left AMA) who presented to [facility] ED 04/14/24 w AMS. Admitted with sepsis and transferred to [facility]. Arrived to ED covered in feces, altered, with poor airway protection and was intubated, extubated on 11/9. 11/13 neurosurgery recommending non op tx of L spine and [place] J collar for C spine. Course complicated by PNA. PMH includes: anxiety, HTN    PT Comments  Pre-medicated for pain and anxiety, however on arrival with HR 120s with incr RR (sats 100%). Noted respirations decreased when pt distracted or talking and appeared related to anxiety. Initially attempted to place in chair position with pt barely tolerating HOB elevated due to back pain. Ultimately he agreed to attempt sit EOB and required +2 max assist. Pt requires close guarding to mod assist for sitting balance with bil UE support. EOB <5 minutes and requesting return to supine due to pain. HR max 126 bpm. Do not feel pt currently can tolerate >3 hrs/day of therapy, therefore recommending post-acute inpatient therapies <3 hrs/day. Anticipate pt will be difficult to place.     If plan is discharge home, recommend the following: Assist for transportation;Help with stairs or ramp for entrance;A lot of help with bathing/dressing/bathroom;Assistance with cooking/housework;Direct supervision/assist for medications management;Direct supervision/assist for financial management;Two people to help with walking and/or transfers   Can travel by private vehicle     No  Equipment Recommendations  None recommended by PT    Recommendations for Other Services       Precautions / Restrictions Precautions Precautions: Fall;Cervical;Back Precaution Booklet Issued: No Recall of Precautions/Restrictions:  Intact Required Braces or Orthoses: Cervical Brace Cervical Brace: Hard collar;At all times Restrictions Weight Bearing Restrictions Per Provider Order: No     Mobility  Bed Mobility Overal bed mobility: Needs Assistance Bed Mobility: Rolling, Sidelying to Sit, Sit to Sidelying Rolling: Mod assist, Max assist, +2 for physical assistance Sidelying to sit: Max assist, +2 for physical assistance     Sit to sidelying: Max assist, +2 for physical assistance General bed mobility comments: best rolling to R side, max assist +2 to roll to L and transition from sidelying to sit (and reverse) increased time and cueing required.    Transfers                        Ambulation/Gait                   Stairs             Wheelchair Mobility     Tilt Bed    Modified Rankin (Stroke Patients Only)       Balance Overall balance assessment: Needs assistance Sitting-balance support: Single extremity supported, Bilateral upper extremity supported, Feet supported Sitting balance-Leahy Scale: Poor Sitting balance - Comments: statically sitting, tolerating <5 minutes with at least 1 UE support. relies on UE support and min assist statically Postural control: Posterior lean                                  Communication Communication Communication: Impaired Factors Affecting Communication: Reduced clarity of speech (soft spoken)  Cognition Arousal: Alert Behavior During Therapy: [facility] for tasks assessed/performed  Following commands: Impaired Following commands impaired: Follows one step commands with increased time, Follows multi-step commands inconsistently    Cueing Cueing Techniques: Verbal cues, Tactile cues  Exercises      General Comments General comments (skin integrity, edema, etc.): SpO2 on RA, HR in 120s throuhgout session, BP stable      Pertinent Vitals/Pain Pain Assessment Pain Assessment:  Faces Faces Pain Scale: Hurts whole lot Pain Location: back, neck Pain Descriptors / Indicators: Discomfort, Guarding Pain Intervention(s): Limited activity within patient's tolerance, Monitored during session, Repositioned, Premedicated before session    Home Living                          Prior Function            PT Goals (current goals can now be found in the care plan section) Acute Rehab PT Goals PT Goal Formulation: With patient Time For Goal Achievement: 05/09/24 Potential to Achieve Goals: Good Progress towards PT goals:  (Goals set)    Frequency    Min 2X/week      PT Plan      Co-evaluation PT/OT/SLP Co-Evaluation/Treatment: Yes Reason for Co-Treatment: To address functional/ADL transfers;For patient/therapist safety;Other (comment) (pain mgmt) PT goals addressed during session: Mobility/safety with mobility OT goals addressed during session: ADL's and self-care      AM-PAC PT 6 Clicks Mobility   Outcome Measure  Help needed turning from your back to your side while in a flat bed without using bedrails?: A Little Help needed moving from lying on your back to sitting on the side of a flat bed without using bedrails?: Total Help needed moving to and from a bed to a chair (including a wheelchair)?: Total Help needed standing up from a chair using your arms (e.g., wheelchair or bedside chair)?: Total Help needed to walk in hospital room?: Total Help needed climbing 3-5 steps with a railing? : Total 6 Click Score: 8    End of Session   Activity Tolerance: Patient limited by pain;Patient limited by fatigue Patient left: in bed;with call bell/phone within reach Nurse Communication: Mobility status PT Visit Diagnosis: Unsteadiness on feet (R26.81);Muscle weakness (generalized) (M62.81);Difficulty in walking, not elsewhere classified (R26.2)     Time: 8780-8750 PT Time Calculation (min) (ACUTE ONLY): 30 min  Charges:    $Therapeutic  Activity: 8-22 mins PT General Charges $$ ACUTE PT VISIT: 1 Visit                      [name] [name], PT Acute Rehabilitation Services  Office [phone]    [name] [name] 04/25/2024, 3:37 PM

## 2024-04-25 NOTE — Progress Notes (Signed)
   04/25/24 1155  Vitals  Temp 99.7 F (37.6 C)  Temp Source Oral  BP (!) 158/98  MAP (mmHg) 117  BP Location Right Arm  BP Method Automatic  Patient Position (if appropriate) Lying  Pulse Rate 99  Pulse Rate Source Monitor  ECG Heart Rate 98  Resp (!) 26  Level of Consciousness  Level of Consciousness Alert  MEWS COLOR  MEWS Score Color Yellow  Oxygen Therapy  SpO2 93 %  O2 Device Room Air  MEWS Score  MEWS Temp 0  MEWS Systolic 0  MEWS Pulse 0  MEWS RR 2  MEWS LOC 0  MEWS Score 2   Pt with tachypnea, labored respirations, and increased WOB. Pt denies chest pain. Pt appears to be anxious. Scheduled pain and PRN anxiety meds given.  Nate, CN notified. Dr. Judeth notified. See new orders.  1415- Pt with intermittent labored respirations and acessory muscle use. Pt pulled up in bed. Mouth suctioned. Nate, CN at bedside. Dr. Judeth notified and enroute.  1423- Dr. Judeth at bedside.  1430- Dr. Rolan Sharps PCCM at bedside. See new orders.

## 2024-04-25 NOTE — Progress Notes (Signed)
Patient returned to unit from CT scan.

## 2024-04-25 NOTE — Progress Notes (Signed)
 VASCULAR LAB    Bilateral lower extremity venous duplex has been performed.  See CV proc for preliminary results.   Farron Watrous, RVT 04/25/2024, 4:03 PM

## 2024-04-25 NOTE — Progress Notes (Signed)
 PROGRESS NOTE   [name] [name]  FMW:[id number]    DOB: August 18, 1975    DOA: 04/14/2024  PCP: Bertell Satterfield, MD   I have briefly reviewed patients previous medical records in [facility].   Brief Hospital Course:  48 year old male with history of drug use in the past, prior MSSA pneumonia and MSSA bacteremia, lumbar osteomyelitis admitted 10/2-10/13 2025 (left AMA) who came back to the hospital on 11/2 with altered mental status, covered in feces, poor airway protection and had to be intubated. He was transferred to [facility] and admitted to the ICU. He was found to have recurrent MSSA bacteremia, tricuspid valve endocarditis, ID was consulted. He has improved, he was eventually extubated and transferred to the hospitalist service on 11/11.  If patient decompensates, recommend prompt bedside evaluation and low threshold to transfer to ICU and consult PCCM.   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  Imaging this hospitalization showed septic pulmonary emboli, an MRI done during prior hospitalization on 10/3 showed discitis/osteomyelitis L3-L4, L5-S1 with a septic right facet joint at L5-S1.  There was also paraspinal inflammation with small bilateral psoas abscesses - 2D echo as above with tricuspid valve mobile masses - Cardiothoracic surgery evaluated patient, recommending medical management and no role for angio vac - ID consulted, initially recommended Ancef  for 6 weeks blood changed to Unasyn on 11/12. 11/11, MRI C-spine with and without contrast: Acute septic arthritis involving the atlantodental articulation at the skull base.  Superimposed 2.1 cm intra-articular fluid collection at the left aspect of the atlantodental  articulation. 11/11, MRI thoracic spine with and without contrast: Acute osteomyelitis, discitis at T5-6.  Consolidative opacities within the partially visualized lungs concerning for multifocal pneumonia. 11/11, MRI lumbar spine without and with contrast: Persistent findings of OM, discitis at L3-4 and L5-S1, mildly progressed from MRI 10/3.  Paraspinous inflammatory changes with superimposed left psoas abscess.  No significant epidural involvement.  Septic arthritis right L5-S1 facet progressed from prior.  Associated 2 cm soft tissue abscess within the adjacent posterior paraspinal soft tissues.  Septic arthritis involving left L3-4 facet, associated 1.1 cm soft tissue abscess within the adjacent posterior paraspinal soft tissues. MRI of C, T and L-spine 11/11 shows extensive findings as noted above including osteomyelitis, discitis, abscesses at various levels.   Neurosurgery follow-up 11/12 appreciated.  They have reviewed CT C-spine from overnight.  They recommend, L-spine: Nonoperative treatment with antibiotics, LSO brace for comfort if needed for back pain when upright; C-spine: Currently not a surgical candidate but may need surgery in the future, recommend [place] J collar.  Follow-up with Dr. Darnella in 6 weeks. ID follow-up 11/13 appreciated, given worsening respiratory status on 11/11, multifocal pneumonia incidentally seen on MRI, changed cefazolin  to Unasyn. Multimodality pain control.  Adjusted pain meds.  Added lidocaine  patch, gabapentin 100 mg twice daily and changed Robaxin to scheduled 5 mg 3 times daily.  Monitor   Acute hypoxic respiratory failure -intubated initially, extubated 11/9, now on room air.  Continue antibiotics as above for MSSA pneumonia Aggressive pulmonary toilet.  Added Mucinex  for chest congestion. Intermittent tachypnea, unsure if this is related to pain or anxiety.  Discussed with RN regarding multimodality pain management and anxiety management Repeated and  reviewed repeat chest x-ray 11/13 Improved left and worsened  right sided aeration. Multifocal pneumonia and cavitary nodules persist. Possible  small left pleural effusion   Acute metabolic encephalopathy -in the setting of severe sepsis, resolved and he is at baseline   History of substance abuse - he told prior [facility] MD he has been clean for several months, has not done any drugs, not drinking, but smoking cigarettes still.   Acute kidney injury  -likely in the setting of severe sepsis, creatinine on admission at 4.0, creatinine slowly trending down as noted below.  Had a degree of ATN from his sepsis picture, will continue to monitor as he may have a degree of chronic kidney disease moving forward   Hyperkalemia -resolved   Hypernatremia -resolved   Elevated LFTs -due to sepsis, shock liver, no intra-abdominal abnormality on CT   Anemia, thrombocytopenia Anemia stable.  Thrombocytopenia resolved.   History of hypertension -blood pressure is stable   History of anxiety Continue alprazolam .   Stage 2 pressure ulcer sacrum, present on admission  -Continue wound care.  Appreciate consultation  Body mass index is 22.72 kg/m.   DVT prophylaxis: heparin  injection 5,000 Units Start: 04/20/24 1400 SCDs Start: 04/14/24 2038     Code Status: Full Code:  Family Communication: None at bedside Disposition:  Status is: Inpatient Remains inpatient appropriate because: IV Abx, awaiting Neurosx input     Consultants:   Cardiothoracic surgery Infectious disease  Procedures:   As above  Subjective:  Seen this morning.  Patient's RN at bedside.  Reporting severe back pain across low back.  As per nursing, tachycardic, tachypneic and intermittently hypertensive, suspect due to pain or anxiety, worse with repositioning in bed where patient stiffens up.  Objective:   Vitals:   04/25/24 0853 04/25/24 1028 04/25/24 1155 04/25/24 1219  BP: (!) 139/96  (!) 158/98   Pulse: (!) 106  (!) 108 99 (!) 107  Resp: (!) 21  (!) 26 (!) 25  Temp:   99.7 F (37.6 C)   TempSrc:   Oral   SpO2: 100% 93% 93% 94%  Weight:      Height:        General exam: Young male, moderately built and frail, chronically ill looking lying uncomfortably in bed.  Almost tearful. Neck: Has Aspen neck collar Respiratory system: Clear to auscultation. Respiratory effort normal. Cardiovascular system: S1 & S2 heard, RRR. No JVD, murmurs, rubs, gallops or clicks. No pedal edema.  Telemetry personally reviewed at bedside: Sinus rhythm.  Had bouts of sinus tachycardia in the 100s-110s Gastrointestinal system: Abdomen is nondistended, soft and nontender. No organomegaly or masses felt. Normal bowel sounds heard. Central nervous system: Alert and oriented. No focal neurological deficits. Extremities: Quadriparetic with grade at least 3 x 5 power in all limbs and 4 x 5 power in left upper extremity. Skin: No rashes, lesions or ulcers Psychiatry: Judgement and insight appear normal. Mood & affect appropriate.     Data Reviewed:   I have personally reviewed following labs and imaging studies   CBC: Recent Labs  Lab 04/23/24 0509 04/24/24 1113 04/25/24 0356  WBC 19.8* 16.1* 15.2*  HGB 10.4* 9.5* 10.2*  HCT 34.9* 31.7* 34.6*  MCV 100.0 97.8 98.6  PLT 217 242 245    Basic Metabolic Panel: Recent Labs  Lab 04/19/24 0507 04/19/24 0507 04/20/24 0511 04/21/24 0943 04/22/24 0627 04/23/24 0509 04/24/24 1113 04/25/24 0356  NA 154*  --  152* 151* 144 145 142 141  K 3.8  --  3.5 3.9 3.6 4.6 4.1 4.3  CL 115*  --  114* 115* 109 111 108 110  CO2 25  --  24 26 24  19* 23 20*  GLUCOSE 126*  --  131* 133* 105* 87 128* 79  BUN 90*  --  92* 88* 73* 71* 66* 65*  CREATININE 2.50*  --  2.04* 1.60* 1.45* 1.62* 1.65* 1.58*  CALCIUM 7.8*  --  7.8* 7.8* 7.9* 8.2* 7.8* 8.2*  MG 2.3  --  2.0  --   --   --  1.8  --   PHOS  --    < > 5.3* 4.2 3.9 4.2 4.5 4.9*   < > = values in this interval not displayed.     Liver Function Tests: Recent Labs  Lab 04/21/24 0943 04/22/24 0627 04/23/24 0509 04/24/24 1113 04/25/24 0356  AST  --   --   --  22  --   ALT  --   --   --  <5  --   ALKPHOS  --   --   --  136*  --   BILITOT  --   --   --  1.1  --   PROT  --   --   --  6.7  --   ALBUMIN 1.7* 1.6* 1.8* 1.5* 1.8*    CBG: Recent Labs  Lab 04/24/24 2116 04/25/24 0745 04/25/24 1154  GLUCAP 128* 98 93    Microbiology Studies:   Recent Results (from the past 240 hours)  Culture, blood (Routine X 2) w Reflex to ID Panel     Status: None   Collection Time: 04/16/24 10:10 AM   Specimen: BLOOD RIGHT HAND  Result Value Ref Range Status   Specimen Description BLOOD RIGHT HAND  Final   Special Requests   Final    BOTTLES DRAWN AEROBIC ONLY Blood Culture results may not be optimal due to an inadequate volume of blood received in culture bottles   Culture   Final    NO GROWTH 5 DAYS Performed at [facility], [address] [place], [place] [address]    Report Status 04/21/2024 FINAL  Final  Culture, blood (Routine X 2) w Reflex to ID Panel     Status: None   Collection Time: 04/16/24 10:16 AM   Specimen: BLOOD LEFT WRIST  Result Value Ref Range Status   Specimen Description BLOOD LEFT WRIST  Final   Special Requests   Final    BOTTLES DRAWN AEROBIC ONLY Blood Culture results may not be optimal due to an inadequate volume of blood received in culture bottles   Culture   Final    NO GROWTH 5 DAYS Performed at [facility], [address] [place], [place] [address]    Report Status 04/21/2024 FINAL  Final    Radiology Studies:  CT CERVICAL SPINE WO CONTRAST Result Date: 04/25/2024 CLINICAL DATA:  Initial evaluation for neck trauma. EXAM: CT CERVICAL SPINE WITHOUT CONTRAST TECHNIQUE: Multidetector CT imaging of the cervical spine was performed without intravenous contrast. Multiplanar CT image reconstructions were also generated. RADIATION DOSE REDUCTION: This exam was  performed according to the departmental dose-optimization program which includes automated exposure control, adjustment of the mA and/or kV according to patient size and/or use of iterative reconstruction technique. COMPARISON:  Prior MRI from 04/23/2024. FINDINGS: Alignment: Examination degraded by motion artifact, limiting assessment. Straightening of the normal cervical lordosis.  No listhesis. Skull base and vertebrae: Again seen is abnormal widening of the atlantodental interval measuring up to  10 mm. [place] a an dens interval also widened up to 15 mm. Findings related to previously identified septic arthritis seen on prior MRI. Associated mild basilar invagination better appreciated on prior MRI. No other evidence for osteomyelitis discitis or septic arthritis elsewhere within the cervical spine by CT. There is an apparent oblique lucency traversing the right articular facet of T1 (series 7, image 47). While this finding is favored to reflect a nutrient foramen and/or artifact on this motion degraded exam, a possible subtle acute nondisplaced fracture is difficult to exclude. No other visible acute fracture. Vertebral body height maintained. No worrisome osseous lesions. Soft tissues and spinal canal: Paraspinous soft tissues demonstrate no acute abnormality. Right thyroid lobe hypoplastic and/or absent. No visible epidural collections. Disc levels: No significant cervical spondylosis for age. No appreciable spinal stenosis. Upper chest: Few scattered patchy and nodular densities within the visualized lungs, likely related to known history of pneumonia and/or septic emboli. Other: None. IMPRESSION: 1. Motion degraded exam. 2. Abnormal widening of the atlantodental and basion-dens intervals secondary to acute septic arthritis, stable from prior MRI. 3. Apparent oblique lucency traversing the right articular facet of T1 as above. While this finding is favored to reflect a nutrient foramen and/or artifact on this  motion degraded exam, a possible subtle acute nondisplaced fracture is difficult to exclude (has there been a traumatic event since recent MRI from 1 day prior?). Correlation with physical exam for possible pain at this location recommended. Correlation with repeat MRI could be performed for further evaluation as warranted. 4. Few scattered patchy and nodular densities within the visualized lungs, likely related to known history of pneumonia and/or septic emboli. Electronically Signed   By: Morene Hoard M.D.   On: 04/25/2024 02:30   MR CERVICAL SPINE W WO CONTRAST Addendum Date: 04/24/2024 ADDENDUM REPORT: 04/24/2024 02:22 ADDENDUM: Findings regarding the skull base infection and potential resultant craniocervical instability discussed by telephone with the nurse practitioner covering for the patient, Stefano Horns at 1:50 a.m. on 04/24/2024. Neurosurgical consultation was recommended. Electronically Signed   By: Morene Hoard M.D.   On: 04/24/2024 02:22   Result Date: 04/24/2024 CLINICAL DATA:  Initial evaluation for bacteremia, acute neck pain. EXAM: MRI CERVICAL SPINE WITHOUT AND WITH CONTRAST TECHNIQUE: Multiplanar and multiecho pulse sequences of the cervical spine, to include the craniocervical junction and cervicothoracic junction, were obtained without and with intravenous contrast. CONTRAST:  7mL GADAVIST GADOBUTROL 1 MMOL/ML IV SOLN COMPARISON:  None Available. FINDINGS: Alignment: Examination degraded by motion artifact. Straightening with slight reversal of the normal cervical lordosis. No listhesis. Vertebrae: Abnormal widening of the atlantodental interval which measures up to 10 mm in diameter. Enhancing soft tissue within the expanded atlantodental space, extending towards the basion. The basion dens interval is also abnormally widened up to 16 mm. Secondary mild basilar invagination of the dens into the ventral brainstem. Finding concerning for acute infection with septic  arthritis. Associated intra-articular fluid collection at the left aspect of the C1-2 articulation measures up to 2.1 cm (series 21, image 9). No significant marrow edema within the adjacent osseous structures to suggest associated osteomyelitis. No other evidence for acute infection elsewhere within the cervical spine. Vertebral body height maintained without acute or chronic fracture. Decreased T1 signal intensity noted throughout the visualized bone marrow, nonspecific, but most commonly related to anemia, smoking or obesity. No worrisome osseous lesions. No other abnormal marrow edema or enhancement. Cord: Normal signal and morphology. Mild epidural enhancement adjacent to the infected C1-2 level, but  no epidural abscess or collection. Posterior Fossa, vertebral arteries, paraspinal tissues: Visualized brain and posterior fossa otherwise unremarkable. Craniocervical junction remains patent. Edema and enhancement at the skull base about the infected atlantodental articulation. Paraspinous soft tissues demonstrate no other acute finding. Normal flow voids seen within the vertebral arteries bilaterally. Disc levels: No significant disc pathology seen within the cervical spine for age. No significant disc bulge or focal disc herniation. No significant facet disease. No canal or neural foraminal stenosis. No neural impingement. IMPRESSION: 1. Findings consistent with acute septic arthritis involving the atlantodental articulation at the skull base. Secondary abnormal widening of the atlantodental and basion-dens intervals with mild basilar invagination as above. Superimposed 2.1 cm intra-articular fluid collection at the left aspect of the atlantodental articulation. 2. No other evidence for acute infection elsewhere within the cervical spine. 3. No significant disc pathology for age. No stenosis or neural impingement. Electronically Signed: By: Morene Hoard M.D. On: 04/24/2024 01:23   MR Lumbar Spine W Wo  Contrast Result Date: 04/24/2024 CLINICAL DATA:  Initial evaluation for acute back pain, bacteremia. EXAM: MRI LUMBAR SPINE WITHOUT AND WITH CONTRAST TECHNIQUE: Multiplanar and multiecho pulse sequences of the lumbar spine were obtained without and with intravenous contrast. CONTRAST:  7mL GADAVIST GADOBUTROL 1 MMOL/ML IV SOLN COMPARISON:  Comparison made with prior MRI from 03/15/2024. FINDINGS: Segmentation: Transitional features about the lumbosacral junction. For the purposes of this dictation, the same numbering system utilized on prior exam is employed on this exam. Therefore, there is a partially lumbarized S1 segment with rudimentary S1-2 interspace. Alignment: Mild levoscoliosis. Alignment otherwise normal preservation of the normal lumbar lordosis. Trace retrolisthesis of L3 on L4, stable. Vertebrae: Persistent findings of osteomyelitis discitis at L3-4. There has been progressive endplate irregularity and fragmentation, with slight vertebral body height loss since previous. Mild epidural enhancement within the adjacent ventral epidural space without discrete epidural abscess. Associated paraspinous inflammatory changes involving the left greater than right psoas musculature. Superimposed abscess within the left psoas measures approximately 2.1 x 1.7 x 8.3 cm (series 8, image 20). Persistent findings of osteomyelitis discitis at L5-S1, greater on the right. Mildly progressive endplate irregularity and enhancement without significant interval vertebral collapse. No significant epidural involvement at this level. Edema and enhancement about the right L5-S1 facet, consistent with ongoing sub septic arthritis, progressed from prior. Associated loculated soft tissue abscess just posterior to the infected right L5-S1 facet measures 1.9 x 1.4 x 2.0 cm (series 12, image 24). Abnormal edema and enhancement also seen about the left L3-4 facet, also consistent with septic arthritis, present on prior exam in  retrospect. Associated 1.1 cm soft tissue abscess within the adjacent posterior paraspinous soft tissues (series 12, image 12). No other new sites of infection elsewhere within the lumbar spine. Vertebral body height otherwise maintained. Diffusely decreased T1 signal intensity throughout the visualized bone marrow, nonspecific, but most commonly related to anemia, smoking, or obesity. No worrisome osseous lesions. Conus medullaris and cauda equina: Conus extends to the L1-2 level. Conus and cauda equina appear normal. Paraspinal and other soft tissues: Paraspinous inflammatory changes as detailed above. Disc levels: L1-2:  Unremarkable. L2-3: Disc desiccation with mild disc bulge. No significant spinal stenosis. Foramina remain patent. L3-4: Findings related to osteomyelitis discitis with associated intervertebral disc space height loss, endplate irregularity, and spurring. Underlying diffuse disc bulge, asymmetric to the left. Mild bilateral facet hypertrophy. No significant spinal stenosis. Moderate bilateral L3 foraminal narrowing. L4-5: Disc desiccation with minimal annular disc bulge. Superimposed right foraminal  disc protrusion closely approximates the right L4 nerve root. Mild left facet hypertrophy. No significant spinal stenosis. Foramina remain patent. L5-S1: Ongoing findings of osteomyelitis discitis. Underlying mild disc bulge with endplate spurring. Right-sided septic arthritis with underlying facet degeneration. No significant spinal stenosis. Moderate bilateral L5 foraminal narrowing. S1-2: Rudimentary S1-2 interspace. No disc bulge or focal disc herniation. Minimal facet degeneration. No stenosis. IMPRESSION: 1. Persistent findings of osteomyelitis discitis at L3-4 and L5-S1, mildly progressed as compared to previous MRI from 03/15/2024. Associated paraspinous inflammatory changes with superimposed left psoas abscess as above. No significant epidural involvement at this time. 2. Septic arthritis  involving the right L5-S1 facet, progressed from prior. Associated 2 cm soft tissue abscess within the adjacent posterior paraspinous soft tissues. 3. Septic arthritis involving the left L3-4 facet, present on prior exam in retrospect. Associated 1.1 cm soft tissue abscess within the adjacent posterior paraspinous soft tissues. 4. Underlying multilevel degenerative spondylosis and facet arthrosis as above. No significant spinal stenosis. Moderate bilateral L3 and L5 foraminal narrowing. 5. Transitional lumbosacral anatomy. Careful correlation with numbering system on this exam recommended prior to any potential future intervention. Electronically Signed   By: Morene Hoard M.D.   On: 04/24/2024 02:18   MR THORACIC SPINE W WO CONTRAST Result Date: 04/24/2024 CLINICAL DATA:  Initial evaluation for bacteremia, acute back pain. EXAM: MRI THORACIC WITHOUT AND WITH CONTRAST TECHNIQUE: Multiplanar and multiecho pulse sequences of the thoracic spine were obtained without and with intravenous contrast. CONTRAST:  7mL GADAVIST GADOBUTROL 1 MMOL/ML IV SOLN COMPARISON:  Comparison made with CT from 04/14/2024. FINDINGS: Alignment:  Examination degraded by motion artifact. Mild dextroscoliosis. Alignment otherwise normal preservation of the normal thoracic kyphosis. No listhesis. Vertebrae: Vertebral body height maintained without acute or chronic fracture. Diffusely decreased T1 signal intensity noted throughout the visualized bone marrow, nonspecific, but most commonly related to anemia, smoking or obesity. No worrisome osseous lesions. Intervertebral disc space height loss with fluid signal intensity within the T5-6 interspace. Marrow edema and enhancement within the adjacent endplates with mild endplate irregularity. Findings consistent with acute osteomyelitis discitis at this level. No significant epidural or paraspinous involvement. No other evidence for acute infection elsewhere within the thoracic spine.  Cord: Grossly normal signal and morphology. No visible cord signal changes on this motion degraded exam. No epidural abscess. No abnormal enhancement. Paraspinal and other soft tissues: Mild diffuse edema within the visualized paraspinous soft tissues, likely related to overall volume status. No loculated collections. Small layering left pleural effusion. Consolidative opacity within the partially visualized lungs, concerning for multifocal pneumonia. Disc levels: T6-7: Small left paracentral disc protrusion without stenosis. T8-9: Small left paracentral disc protrusion without stenosis. Otherwise, no other significant disc pathology seen within the thoracic spine. No stenosis or IMPRESSION: 1. Findings consistent with acute osteomyelitis discitis at T5-6. No significant epidural or paraspinous involvement at this time. 2. Consolidative opacities within the partially visualized lungs, concerning for multifocal pneumonia. Associated small left pleural effusion. 3. Small disc protrusions at T6-7 and T8-9 without stenosis or impingement. Electronically Signed   By: Morene Hoard M.D.   On: 04/24/2024 01:43    Scheduled Meds:    ALPRAZolam   1 mg Oral Q8H   Chlorhexidine Gluconate Cloth  6 each Topical Daily   cloNIDine  0.1 mg Oral TID   feeding supplement  237 mL Oral BID BM   folic acid   1 mg Oral Daily   gabapentin  100 mg Oral BID   guaiFENesin   1,200 mg Oral  BID   heparin  injection (subcutaneous)  5,000 Units Subcutaneous Q8H   insulin aspart  2-6 Units Subcutaneous TID AC & HS   lidocaine   1 patch Transdermal Q24H   methocarbamol  500 mg Oral TID   multivitamin with minerals  1 tablet Oral Daily   nutrition supplement (JUVEN)  1 packet Oral BID BM   oxyCODONE   5 mg Oral Q6H    Continuous Infusions:    ampicillin-sulbactam (UNASYN) IV 3 g (04/25/24 1152)   dexmedetomidine (PRECEDEX) IV infusion Stopped (04/22/24 1320)     LOS: 11 days     Trenda Mar, MD,  FACP, [facility], [facility],  [facility]   [facility] [place]      To contact the attending provider between 7A-7P or the covering provider during after hours 7P-7A, please log into the web site [email or web address] and access using universal [place] password for that web site. If you do not have the password, please call the hospital operator.  04/25/2024, 1:55 PM

## 2024-04-25 NOTE — Progress Notes (Signed)
 Occupational Therapy Treatment Patient Details Name: [name] [name] MRN: [id number] DOB: Jun 05, 1976 Today's Date: 04/25/2024   History of present illness 48 yo M recently admitted with MSSA PNA MSSA bacteremia psoas abscesses and lumbar osteo 03/14/24-03/25/24 (left AMA) who presented to [facility] ED 04/14/24 w AMS. Admitted with sepsis and transferred to [facility]. Arrived to ED covered in feces, altered, with poor airway protection and was intubated, extubated on 11/9. 11/13 neurosurgery recommending non op tx of L spine and miami J collar for C spine. Course complicated by PNA. PMH includes: anxiety, HTN   OT comments  Patient seen with PT to optimize participation and tolerance for activity. Premedicated, but appears anxious and painful throughout session.  Pt asking to remove hard collar, educated on importance of use.  Requires max assist +2 for bed mobility (rolling to R was less assist, only mod A), sitting EOB with min assist for <5 minutes before asking to return to supine.  He relies on 1-2 UE support sitting EOB, there Adls are bed level at this time.  Min to total assist.  Continue to recommend <3hrs/day inpatient setting at dc.  Will follow acutely.       If plan is discharge home, recommend the following:  Two people to help with walking and/or transfers;Two people to help with bathing/dressing/bathroom;Direct supervision/assist for medications management;Direct supervision/assist for financial management;Assist for transportation;Help with stairs or ramp for entrance   Equipment Recommendations  Other (comment) (defer)    Recommendations for Other Services      Precautions / Restrictions Precautions Precautions: Fall;Cervical;Back Precaution Booklet Issued: No Recall of Precautions/Restrictions: Intact Required Braces or Orthoses: Cervical Brace Cervical Brace: Hard collar;At all times Restrictions Weight Bearing Restrictions Per Provider Order: No       Mobility Bed  Mobility Overal bed mobility: Needs Assistance Bed Mobility: Rolling, Sidelying to Sit, Sit to Sidelying Rolling: Mod assist, Max assist, +2 for physical assistance Sidelying to sit: Max assist, +2 for physical assistance     Sit to sidelying: Max assist, +2 for physical assistance General bed mobility comments: best rolling to R side, max assist +2 to roll to L and transition from sidelying to sit (and reverse) increased time and cueing required.    Transfers                         Balance Overall balance assessment: Needs assistance Sitting-balance support: Single extremity supported, Bilateral upper extremity supported, Feet supported Sitting balance-Leahy Scale: Poor Sitting balance - Comments: statically sitting, tolerating <5 minutes with at least 1 UE support. relies on UE support and min assist statically Postural control: Posterior lean                                 ADL either performed or assessed with clinical judgement   ADL Overall ADL's : Needs assistance/impaired                 Upper Body Dressing : Sitting;Maximal assistance   Lower Body Dressing: Total assistance;+2 for physical assistance;Bed level     Toilet Transfer Details (indicate cue type and reason): deferred           General ADL Comments: pt limited by pain and tolerance to activity OOB    Extremity/Trunk Assessment Upper Extremity Assessment Upper Extremity Assessment: Generalized weakness RUE Deficits / Details: tremulous with movement RUE Coordination: decreased gross motor LUE Deficits /  Details: tremulous with movement LUE Coordination: decreased gross motor   Lower Extremity Assessment Lower Extremity Assessment: Defer to PT evaluation        Vision       Perception     Praxis     Communication Communication Communication: Impaired Factors Affecting Communication: Reduced clarity of speech (soft spoken)   Cognition Arousal:  Alert Behavior During Therapy: WFL for tasks assessed/performed Cognition: Cognition impaired     Awareness: Online awareness impaired Memory impairment (select all impairments): Short-term memory Attention impairment (select first level of impairment): Selective attention Executive functioning impairment (select all impairments): Reasoning, Sequencing, Problem solving OT - Cognition Comments: orientation NT, requires repetition and increased time for all tasks.  does follow commands with increased time                 Following commands: Impaired Following commands impaired: Follows one step commands with increased time, Follows multi-step commands inconsistently      Cueing   Cueing Techniques: Verbal cues, Tactile cues  Exercises      Shoulder Instructions       General Comments SpO2 on RA, HR in 120s throuhgout session, BP stable    Pertinent Vitals/ Pain       Pain Assessment Pain Assessment: Faces Faces Pain Scale: Hurts whole lot Pain Location: back, neck Pain Descriptors / Indicators: Discomfort, Guarding Pain Intervention(s): Limited activity within patient's tolerance, Monitored during session, Premedicated before session, Repositioned  Home Living                                          Prior Functioning/Environment              Frequency  Min 2X/week        Progress Toward Goals  OT Goals(current goals can now be found in the care plan section)  Progress towards OT goals: Progressing toward goals (slowly)  Acute Rehab OT Goals Patient Stated Goal: less pain OT Goal Formulation: With patient Time For Goal Achievement: 05/01/24 Potential to Achieve Goals: Good  Plan      Co-evaluation    PT/OT/SLP Co-Evaluation/Treatment: Yes Reason for Co-Treatment: To address functional/ADL transfers;For patient/therapist safety;Other (comment) (pain mgmt) PT goals addressed during session: Mobility/safety with mobility OT goals  addressed during session: ADL's and self-care      AM-PAC OT 6 Clicks Daily Activity     Outcome Measure   Help from another person eating meals?: A Little Help from another person taking care of personal grooming?: A Little Help from another person toileting, which includes using toliet, bedpan, or urinal?: Total Help from another person bathing (including washing, rinsing, drying)?: A Lot Help from another person to put on and taking off regular upper body clothing?: A Lot Help from another person to put on and taking off regular lower body clothing?: Total 6 Click Score: 12    End of Session    OT Visit Diagnosis: Unsteadiness on feet (R26.81);Muscle weakness (generalized) (M62.81);Pain Pain - part of body:  (back, neck)   Activity Tolerance Patient limited by pain   Patient Left in bed;with call bell/phone within reach;with bed alarm set   Nurse Communication Mobility status;Precautions        Time: 8777-8750 OT Time Calculation (min): 27 min  Charges: OT General Charges $OT Visit: 1 Visit OT Treatments $Self Care/Home Management : 8-22 mins  Etta NOVAK, OT  Acute Rehabilitation Services Office [phone] Secure Chat Preferred    Etta GORMAN [place] 04/25/2024, 2:08 PM

## 2024-04-25 NOTE — Progress Notes (Addendum)
 NAME:  [name] [name], MRN:  [id number], DOB:  [birth year], LOS: 11 ADMISSION DATE:  04/14/2024, CONSULTATION DATE:  04/14/2024 REFERRING MD:  S[place] - [facility] EDP, CHIEF COMPLAINT:  AMS  History of Present Illness:  48 year old man who presented to [facility] ED 11/2 with AMS. PMHx significant for MSSA bacteremia, MSSA PNA, infective endocarditis, cervical through lumbar osteomyelitis, psoas abscess, severe protein calorie malnutrition/FTT, substance abuse. Recent admission 10/2 - 10/13 for sepsis, widespread MSSA.  Arrived to [facility] ED covered in feces, altered and with poor airway protection; he was subsequently intubated. CXR demonstrated RLL opacities. Confirmed bilateral septic pulmonary emboli, tricuspid valve endocarditis.  Required intubation and mechanical ventilation. Clinically progressed and was transferred out of ICU 11/10.  Reconsulted 11/13 for AMS, tachypnea and tachycardia.   Pertinent Medical History:  MSSA bacteremia MSSA PNA Lumbar osteomyelitis  Psoas abscess  Severe protein calorie malnutrition FTT in adult  Significant Hospital Events: Including procedures, antibiotic start and stop dates in addition to other pertinent events   11/2 [facility] ED w AMS. Intubated. Started on vanc mero. Txf request to [place] to [facility]  11/3 extubated, developed respiratory distress requiring reintubation. Brief cardiac arrest during intubation.  11/3 transthoracic echocardiogram > LVEF 40-45%, global hypokinesis, grade 1 diastolic dysfunction, normal RV function.  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  No mitral or aortic valve involvement 11/3 CT chest, abdomen, pelvis >> numerous bilateral cavitary and solid nodules consistent with septic emboli, left lower lobe consolidation with a small pleural effusion, right basilar atelectasis.  Moderate free fluid in the pelvis.  Erosive changes at T5-T6, L2-L3, L4-L5 suggestive of discitis 11/3 CT head >> no acute abnormality.   Opacified left maxillary sinus 11/10 - Transferred out of ICU.  Interim History/Subjective:  PCCM reconsulted for decreased mental status, tachycardia, tachypnea.  Objective:   Blood pressure (!) 149/98, pulse (!) 115, temperature 98.8 F (37.1 C), temperature source Oral, resp. rate 16, height 6' (1.829 m), weight 76 kg, SpO2 99%.        Intake/Output Summary (Last 24 hours) at 04/25/2024 1626 Last data filed at 04/25/2024 1404 Gross per 24 hour  Intake 362.52 ml  Output 2200 ml  Net -1837.48 ml   Filed Weights   04/21/24 0500 04/22/24 0500 04/25/24 0500  Weight: 69 kg 68.8 kg 76 kg   Physical Examination: General: Acutely ill-appearing middle-aged man in NAD. Appears uncomfortable. HEENT: [place]/AT, anicteric sclera, PERRL, dry mucous membranes. Neck: Aspen C-collar in place. Neuro: Awake, drowsy/intermittently somnolent, Responds to verbal stimuli. Following commands consistently. CV: Tachycardic, regular rhythm. PULM: Breathing tachypneic but unlabored on 2LNC. Lung fields coarse in lower fields. GI: Soft, nontender, nondistended. Normoactive bowel sounds. Extremities: Bilateral symmetric 1-2+ pitting LE edema noted. Skin: Warm/dry, no rashes.  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 Left AMA 10/13. BCx MSSA bacteremia. UCx 60k colonies staph.  Acute Metabolic Encephalopathy AMS Hx of substance abuse  Acute renal failure, improving Elevated Alk phos  Anemia Hx of HTN Hx of Anxiety  Stage 2 pressure ulcer sacrum, present on admission  - Evaluated  patient at bedside, noted tachypnea/tachycardia, pitting edema to BLE, coarse breath sounds - Patient is  fortunately protecting his airway, does not endorse SOB or pain at present - Recommend albumin/Lasix trial - Monitor I&Os - LE dopplers negative for DVT - CXR in AM - Ok to monitor in progressive level of care for now, if worsened mental status or concern for airway protection PCCM will be available for reevaluation - PCCM will f/u tomorrow, 11/14  Critical care time: N/A   Corean CHRISTELLA Ilah DEVONNA [facility] 04/25/24 4:26 PM  Please see [email or web address] for pager details.  From 7A-7P if no response, please call [phone] After hours, please call ELink [phone]

## 2024-04-26 ENCOUNTER — Inpatient Hospital Stay (HOSPITAL_COMMUNITY): Payer: MEDICAID

## 2024-04-26 DIAGNOSIS — J181 Lobar pneumonia, unspecified organism: Secondary | ICD-10-CM

## 2024-04-26 DIAGNOSIS — M0088 Arthritis due to other bacteria, vertebrae: Secondary | ICD-10-CM

## 2024-04-26 LAB — RENAL FUNCTION PANEL
Albumin: 2.3 g/dL — ABNORMAL LOW (ref 3.5–5.0)
Anion gap: 12 (ref 5–15)
BUN: 62 mg/dL — ABNORMAL HIGH (ref 6–20)
CO2: 23 mmol/L (ref 22–32)
Calcium: 8.1 mg/dL — ABNORMAL LOW (ref 8.9–10.3)
Chloride: 108 mmol/L (ref 98–111)
Creatinine, Ser: 1.66 mg/dL — ABNORMAL HIGH (ref 0.61–1.24)
GFR, Estimated: 51 mL/min — ABNORMAL LOW (ref >60)
Glucose, Bld: 104 mg/dL — ABNORMAL HIGH (ref 70–99)
Phosphorus: 5.6 mg/dL — ABNORMAL HIGH (ref 2.5–4.6)
Potassium: 3.9 mmol/L (ref 3.5–5.1)
Sodium: 143 mmol/L (ref 135–145)

## 2024-04-26 LAB — GLUCOSE, CAPILLARY: Glucose-Capillary: 96 mg/dL (ref 70–99)

## 2024-04-26 LAB — TRIGLYCERIDES: Triglycerides: 91 mg/dL (ref <150)

## 2024-04-26 MED ORDER — LINEZOLID 600 MG PO TABS
600.0000 mg | ORAL_TABLET | Freq: Two times a day (BID) | ORAL | Status: DC
  Administered 2024-04-26 – 2024-04-27 (×3): 600 mg via ORAL
  Filled 2024-04-26 (×4): qty 1

## 2024-04-26 MED ORDER — PIPERACILLIN-TAZOBACTAM 3.375 G IVPB
3.3750 g | Freq: Three times a day (TID) | INTRAVENOUS | Status: DC
  Administered 2024-04-26 – 2024-04-28 (×6): 3.375 g via INTRAVENOUS
  Filled 2024-04-26 (×6): qty 50

## 2024-04-26 NOTE — Plan of Care (Signed)

## 2024-04-26 NOTE — Progress Notes (Signed)
 [facility]  Date of Admission:  04/14/2024     Reason for Follow Up: Sepsis [facility])  Total days of antibiotics 12         ASSESSMENT:  [name] [name] is a 48 year old Caucasian gentleman admitted with MSSA pneumonia, bacteremia, tricuspid valve endocarditis, psoas abscess, and lumbar osteomyelitis.  Admitted on 11/2 with altered mental status and inability to protect airway requiring intubation.  Now extubated.  Neck and back imaging with multiple areas of osteomyelitis/discitis and abnormal widening of the atlantodental and basion-dens intervals secondary to acute septic arthritis. No surgical intervention planned.   Mr. Shouse continues to increased work of breathing and antibiotics have escalated to include piperacillin-tazobactam and linezolid . Unlikely MRSA infection with MRSA PCR negative and would recommend continuing with piperacillin-tazobactam for treatment of pneumonia depending upon response although does have high burden of infection. Continue standard/universal precautions. Remaining medical and supportive care per Internal Medicine.   PLAN:  Continue current dose of piperacillin-tazobactam.  MRSA PCR negative and consider stopping linezolid .  Standard/universal precautions.  Remaining medical and supportive care per Internal Medicine.  Principal Problem:   Sepsis (HCC) Active Problems:   MSSA bacteremia   Severe sepsis (HCC)    ALPRAZolam   1 mg Oral Q8H   Chlorhexidine Gluconate Cloth  6 each Topical Daily   cloNIDine  0.1 mg Oral TID   feeding supplement  237 mL Oral BID BM   folic acid   1 mg Oral Daily   furosemide  60 mg Intravenous Q8H   gabapentin  100 mg Oral BID   guaiFENesin   1,200 mg Oral BID   heparin  injection (subcutaneous)  5,000 Units Subcutaneous Q8H   lidocaine   1 patch Transdermal Q24H   linezolid   600 mg Oral Q12H   methocarbamol  500 mg Oral TID   multivitamin with minerals  1 tablet Oral Daily   nutrition  supplement (JUVEN)  1 packet Oral BID BM   oxyCODONE   5 mg Oral Q6H    SUBJECTIVE:  Febrile overnight. Sleeping upon arrival and arousable. In C-collar.   No Known Allergies   Review of Systems: Review of Systems  Constitutional:  Positive for fever. Negative for chills and weight loss.  Respiratory:  Positive for shortness of breath. Negative for cough and wheezing.   Cardiovascular:  Negative for chest pain and leg swelling.  Gastrointestinal:  Negative for abdominal pain, constipation, diarrhea, nausea and vomiting.  Skin:  Negative for rash.      OBJECTIVE: Vitals:   04/26/24 0500 04/26/24 0745 04/26/24 1138 04/26/24 1615  BP:  (!) 140/97 (!) 158/105 (!) 142/96  Pulse:  (!) 116 (!) 113 (!) 108  Resp:  20 18 18   Temp:  99.7 F (37.6 C) 99.3 F (37.4 C) 98.3 F (36.8 C)  TempSrc:  Oral Axillary Oral  SpO2:  99% 97% 96%  Weight: 76.2 kg     Height:       Body mass index is 22.78 kg/m.  Physical Exam Constitutional:      General: He is not in acute distress.    Appearance: He is well-developed.  Cardiovascular:     Rate and Rhythm: Tachycardia present.     Heart sounds: Normal heart sounds.  Pulmonary:     Effort: Pulmonary effort is normal.     Breath sounds: Rhonchi present.  Skin:    General: Skin is warm and dry.  Neurological:     Mental Status: He is alert.  Lab Results Lab Results  Component Value Date   WBC 15.2 (H) 04/25/2024   HGB 10.2 (L) 04/25/2024   HCT 34.6 (L) 04/25/2024   MCV 98.6 04/25/2024   PLT 245 04/25/2024    Lab Results  Component Value Date   CREATININE 1.66 (H) 04/26/2024   BUN 62 (H) 04/26/2024   NA 143 04/26/2024   K 3.9 04/26/2024   CL 108 04/26/2024   CO2 23 04/26/2024    Lab Results  Component Value Date   ALT <5 04/24/2024   AST 22 04/24/2024   ALKPHOS 136 (H) 04/24/2024   BILITOT 1.1 04/24/2024     Microbiology: No results found for this or any previous visit (from the past 240 hours).   Greg  Adreonna Yontz, NP [facility]  04/26/2024  4:35 PM

## 2024-04-26 NOTE — Progress Notes (Signed)
 Physical Therapy Treatment Patient Details Name: [name] [name] MRN: [id number] DOB: 06-25-1975 Today's Date: 04/26/2024   History of Present Illness 48 yo M recently admitted with MSSA PNA MSSA bacteremia psoas abscesses and lumbar osteo 03/14/24-03/25/24 (left AMA) who presented to [facility] ED 04/14/24 w AMS. Admitted with sepsis and transferred to [facility]. Arrived to ED covered in feces, altered, with poor airway protection and was intubated, extubated on 11/9. 11/13 neurosurgery recommending non op tx of L spine and miami J collar for C spine. Course complicated by PNA. PMH includes: anxiety, HTN    PT Comments  Pt restless and anxious upon arrival to room, had pulled off O2 satting 85% and had spilled food/drinks over bed and floor. Pt placed back on O2 with good recovery to 90s%. Pt agreeable to bed-level session focused on bed mobility, PT emphasizing correct form for rolling and boost up given back and cervical precautions. Pt tolerating LE exercises well, reports improved back pain. PT to continue to follow.      If plan is discharge home, recommend the following: Assist for transportation;Help with stairs or ramp for entrance;A lot of help with bathing/dressing/bathroom;Assistance with cooking/housework;Direct supervision/assist for medications management;Direct supervision/assist for financial management;Two people to help with walking and/or transfers   Can travel by private vehicle        Equipment Recommendations  None recommended by PT    Recommendations for Other Services       Precautions / Restrictions Precautions Precautions: Fall;Cervical;Back Precaution Booklet Issued: No Recall of Precautions/Restrictions: Intact Required Braces or Orthoses: Cervical Brace Cervical Brace: Hard collar;At all times (c-collar ill-fitting upon PT arrival to room, PT adjusted for proper fit while pt in supine.) Restrictions Weight Bearing Restrictions Per Provider Order: No     Mobility   Bed Mobility Overal bed mobility: Needs Assistance Bed Mobility: Rolling Rolling: Mod assist, +2 for physical assistance         General bed mobility comments: rolling L/R x2 both directions with cues for UE use and hip and knee flexion to facilitate roll    Transfers                   General transfer comment: nt - pt declines EOB    Ambulation/Gait                   Stairs             Wheelchair Mobility     Tilt Bed    Modified Rankin (Stroke Patients Only)       Balance Overall balance assessment: Needs assistance                                          Communication Communication Communication: Impaired Factors Affecting Communication: Reduced clarity of speech (soft spoken)  Cognition Arousal: Alert Behavior During Therapy: Anxious   PT - Cognitive impairments: Problem solving, Safety/Judgement, Attention, Sequencing                         Following commands: Impaired Following commands impaired: Follows one step commands with increased time, Follows multi-step commands inconsistently    Cueing Cueing Techniques: Verbal cues, Tactile cues  Exercises General Exercises - Lower Extremity Ankle Circles/Pumps: AAROM, Both, 10 reps, Supine Heel Slides: AAROM, Both, Supine, 10 reps Hip ABduction/ADduction: AAROM, Both, 10 reps, Supine  General Comments General comments (skin integrity, edema, etc.): SPO2 92% and greater on 2LO2, HR 107-120s      Pertinent Vitals/Pain Pain Assessment Pain Assessment: Faces Faces Pain Scale: Hurts whole lot Pain Location: back, neck Pain Descriptors / Indicators: Discomfort, Guarding Pain Intervention(s): Limited activity within patient's tolerance, Monitored during session, Repositioned    Home Living                          Prior Function            PT Goals (current goals can now be found in the care plan section) Acute Rehab PT Goals PT Goal  Formulation: With patient Time For Goal Achievement: 05/09/24 Potential to Achieve Goals: Good Progress towards PT goals: Progressing toward goals    Frequency    Min 2X/week      PT Plan      Co-evaluation   Reason for Co-Treatment:  (pain mgmt)          AM-PAC PT 6 Clicks Mobility   Outcome Measure  Help needed turning from your back to your side while in a flat bed without using bedrails?: A Lot Help needed moving from lying on your back to sitting on the side of a flat bed without using bedrails?: A Lot Help needed moving to and from a bed to a chair (including a wheelchair)?: Total Help needed standing up from a chair using your arms (e.g., wheelchair or bedside chair)?: Total Help needed to walk in hospital room?: Total Help needed climbing 3-5 steps with a railing? : Total 6 Click Score: 8    End of Session Equipment Utilized During Treatment: Oxygen Activity Tolerance: Patient limited by pain;Patient limited by fatigue Patient left: in bed;with call bell/phone within reach;with bed alarm set Nurse Communication: Mobility status PT Visit Diagnosis: Unsteadiness on feet (R26.81);Muscle weakness (generalized) (M62.81);Difficulty in walking, not elsewhere classified (R26.2)     Time: 8399-8376 PT Time Calculation (min) (ACUTE ONLY): 23 min  Charges:    $Therapeutic Activity: 8-22 mins PT General Charges $$ ACUTE PT VISIT: 1 Visit                     Johana RAMAN, PT DPT Acute Rehabilitation Services Secure Chat Preferred  Office [phone]    [name] [name] 04/26/2024, 4:56 PM

## 2024-04-26 NOTE — Progress Notes (Signed)
 NAME:  BODI PALMERI, MRN:  [id number], DOB:  [birth year], LOS: 12 ADMISSION DATE:  04/14/2024, CONSULTATION DATE:  04/14/2024 REFERRING MD:  S[place] - [facility] EDP, CHIEF COMPLAINT:  AMS  History of Present Illness:  48 year old man who presented to [facility] ED 11/2 with AMS. PMHx significant for MSSA bacteremia, MSSA PNA, infective endocarditis, cervical through lumbar osteomyelitis, psoas abscess, severe protein calorie malnutrition/FTT, substance abuse. Recent admission 10/2 - 10/13 for sepsis, widespread MSSA.  Arrived to [facility] ED covered in feces, altered and with poor airway protection; he was subsequently intubated. CXR demonstrated RLL opacities. Confirmed bilateral septic pulmonary emboli, tricuspid valve endocarditis.  Required intubation and mechanical ventilation. Clinically progressed and was transferred out of ICU 11/10.  Reconsulted 11/13 for AMS, tachypnea and tachycardia.   Pertinent Medical History:  MSSA bacteremia MSSA PNA Lumbar osteomyelitis  Psoas abscess  Severe protein calorie malnutrition FTT in adult  Significant Hospital Events: Including procedures, antibiotic start and stop dates in addition to other pertinent events   11/2 [facility] ED w AMS. Intubated. Started on vanc mero. Txf request to [place] to [facility]  11/3 extubated, developed respiratory distress requiring reintubation. Brief cardiac arrest during intubation.  11/3 transthoracic echocardiogram > LVEF 40-45%, global hypokinesis, grade 1 diastolic dysfunction, normal RV function.  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  No mitral or aortic valve involvement 11/3 CT chest, abdomen, pelvis >> numerous bilateral cavitary and solid nodules consistent with septic emboli, left lower lobe consolidation with a small pleural effusion, right basilar atelectasis.  Moderate free fluid in the pelvis.  Erosive changes at T5-T6, L2-L3, L4-L5 suggestive of discitis 11/3 CT head >> no acute abnormality.   Opacified left maxillary sinus 11/10 - Transferred out of ICU. 11/13- PCCM reconsulted for decreased mental status, tachycardia, tachypnea. Started diuretics  Interim History/Subjective:  Febrile overnight. Still feels SOB.   Objective:   Blood pressure (!) 140/97, pulse (!) 116, temperature 99.7 F (37.6 C), temperature source Oral, resp. rate 20, height 6' (1.829 m), weight 76.2 kg, SpO2 99%.        Intake/Output Summary (Last 24 hours) at 04/26/2024 0818 Last data filed at 04/26/2024 9380 Gross per 24 hour  Intake --  Output 1950 ml  Net -1950 ml   Filed Weights   04/22/24 0500 04/25/24 0500 04/26/24 0500  Weight: 68.8 kg 76 kg 76.2 kg   Physical Examination: General: ill appearing man lying in bed in NAD HEENT: Jud/AT, eyes anicteric Neck: C-collar in place Neuro: arouses to stimulation, moving arms. Won't cough on command, but comprehension appears intact. CV: S1S2, RRR PULM: mild tachypnea, no accessory muscle use HP:dnqu, NT Extremities: +edema  CXR personally reviewed> nodular opacities, increased opacification LLL, slightly better than yesterday.  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 Left AMA 10/13. BCx MSSA bacteremia. UCx 60k colonies staph.  Acute Metabolic Encephalopathy AMS Hx of substance abuse  Acute renal failure, improving Elevated Alk phos  Anemia Hx of HTN Hx of Anxiety  Stage 2 pressure ulcer sacrum, present on admission   -more diuresis today-- lasix 60mg  q8h is appropriate -recommend escalating  antibiotics to cover HAP  organisms due to ongoing fevers (Linezolid , zosyn). MRSA nares.  -collect trach aspirate culture if able  -ok to stay on progressive care -supplemental O2 as required to maintain SpO>90%  Critical care time: N/A   Leita SHAUNNA Gaskins, DO 04/26/24 12:45 PM [facility]  For contact information, see Amion. If no response to pager, please call PCCM consult pager. After hours, 7PM- 7AM, please call Elink.

## 2024-04-26 NOTE — Progress Notes (Signed)
 PROGRESS NOTE   [name] [name]  FMW:[id number]    DOB: 09-30-1975    DOA: 04/14/2024  PCP: Bertell Satterfield, MD   I have briefly reviewed patients previous medical records in [facility].   Brief Hospital Course:  48 year old male with history of drug use in the past, prior MSSA pneumonia and MSSA bacteremia, lumbar osteomyelitis admitted 10/2-10/13 2025 (left AMA) who came back to the hospital on 11/2 with altered mental status, covered in feces, poor airway protection and had to be intubated. He was transferred to [facility] and admitted to the ICU. He was found to have recurrent MSSA bacteremia, tricuspid valve endocarditis, multi spine level discitis/osteomyelitis/abscesses, septic pulmonary emboli, MSSA pneumonia with intractable pain.  Neurosurgery and ID was consulted.  Treating nonsurgically.  Course complicated on 11/13 with tachypnea, tachycardia and dyspnea.  CCM had transferred to [facility] and signed off on 11/11, reconsulted them on 11/13.  Diuresing with IV Lasix.  If patient decompensates, recommend prompt bedside evaluation and low threshold to transfer to ICU and consult PCCM.   Assessment & Plan:   Severe sepsis, POA Due to MSSA bacteremia with tricuspid valve endocarditis Complicated by discitis/osteomyelitis at the lumbar spine level, recent psoas abscess, septic pulmonary emboli/MSSA pneumonia Intractable back pain blood cultures on admission 11/2 are showing MSSA bacteremia.  Imaging this hospitalization showed septic pulmonary emboli, an MRI done during prior hospitalization on 10/3 showed discitis/osteomyelitis L3-L4, L5-S1 with a septic right facet joint at L5-S1.  There was also paraspinal inflammation with small bilateral psoas abscesses - 2D echo as above with tricuspid valve mobile masses - Cardiothoracic surgery evaluated patient, recommending medical management and no role for angio vac - ID consulted, initially recommended Ancef  for 6 weeks but to  cover for pneumonia, changed to Unasyn on 11/12. 11/11, MRI C-spine with and without contrast: Acute septic arthritis involving the atlantodental articulation at the skull base.  Superimposed 2.1 cm intra-articular fluid collection at the left aspect of the atlantodental articulation. 11/11, MRI thoracic spine with and without contrast: Acute osteomyelitis, discitis at T5-6.  Consolidative opacities within the partially visualized lungs concerning for multifocal pneumonia. 11/11, MRI lumbar spine without and with contrast: Persistent findings of OM, discitis at L3-4 and L5-S1, mildly progressed from MRI 10/3.  Paraspinous inflammatory changes with superimposed left psoas abscess.  No significant epidural involvement.  Septic arthritis right L5-S1 facet progressed from prior.  Associated 2 cm soft tissue abscess within the adjacent posterior paraspinal soft tissues.  Septic arthritis involving left L3-4 facet, associated 1.1 cm soft tissue abscess within the adjacent posterior paraspinal soft tissues. MRI of C, T and L-spine 11/11 shows extensive findings as noted above including osteomyelitis, discitis, abscesses at various levels.   Neurosurgery follow-up 11/12 appreciated.  They have reviewed CT C-spine from overnight.  They recommend, L-spine: Nonoperative treatment with antibiotics, LSO brace for comfort if needed for back pain when upright; C-spine: Currently not a surgical candidate but may need surgery in the future, recommend [place] J collar.  Follow-up with Dr. Darnella in 6 weeks. Multimodality pain control.  Adjusted pain meds.  Added lidocaine  patch, gabapentin 100 mg twice daily and changed Robaxin to scheduled 5 mg 3 times daily.  Pain appears to be somewhat better controlled.   Acute hypoxic respiratory failure -intubated initially, extubated 11/9, now on room air.  Continue antibiotics as above for MSSA pneumonia 11/13 with decline, tachycardic, tachypneic, transient hypoxia requiring   oxygen.  Chest x-ray reviewed.  CCM consulted.  Lower extremity venous Dopplers negative for DVT.  Diuresed with IV Lasix with improvement.  CCM following and continuing aggressive IV diuresis.  They also broadened antibiotic coverage and added linezolid  for HAP coverage.   Acute metabolic encephalopathy -in the setting of severe sepsis, resolved and he is at baseline.  Monitor closely while on multimodality pain control.   History of substance abuse - he told prior [facility] MD he has been clean for several months, has not done any drugs, not drinking, but smoking cigarettes still.   Acute kidney injury  -likely in the setting of severe sepsis, creatinine on admission at 4.0, creatinine slowly trending down as noted below.  Had a degree of ATN from his sepsis picture, will continue to monitor as he may have a degree of chronic kidney disease moving forward.  Creatinine is bumped up from 1.58-1.66 post diuresis, monitor closely while on IV Lasix.   Hyperkalemia -resolved   Hypernatremia -resolved   Elevated LFTs -due to sepsis, shock liver, no intra-abdominal abnormality on CT   Anemia, thrombocytopenia Anemia stable.  Thrombocytopenia resolved.   History of hypertension Intermittent mild hypertension.   History of anxiety Continue alprazolam .   Stage 2 pressure ulcer sacrum, present on admission  -Continue wound care.  Appreciate consultation  Body mass index is 22.78 kg/m.   DVT prophylaxis: Place TED hose Start: 04/26/24 1238 heparin  injection 5,000 Units Start: 04/20/24 1400 SCDs Start: 04/14/24 2038     Code Status: Full Code:  Family Communication: None at bedside Disposition:  Status is: Inpatient Remains inpatient appropriate because: IV Abx, awaiting Neurosx input     Consultants:   Cardiothoracic surgery Infectious disease  Procedures:   As above  Subjective:  Seen this morning.  Stated that his breathing was excellent.  Was asking if he can have his  dental work done while he was here, advised him that this will have to wait given his critical illness.  Still in pain but did not answer if it was better controlled compared to day prior.  Objective:   Vitals:   04/26/24 0300 04/26/24 0500 04/26/24 0745 04/26/24 1138  BP: (!) 146/98  (!) 140/97 (!) 158/105  Pulse: (!) 114  (!) 116 (!) 113  Resp: 18  20 18   Temp: 100.2 F (37.9 C)  99.7 F (37.6 C) 99.3 F (37.4 C)  TempSrc: Oral  Oral Axillary  SpO2: 92%  99% 97%  Weight:  76.2 kg    Height:        General exam: Young male, moderately built and frail, chronically ill looking looks improved compared to yesterday.  Did not appear in any distress. Neck: Has Aspen neck collar Respiratory system: Clear to auscultation anteriorly.  Slightly diminished breath sounds in the bases with occasional basal crackles.  No wheezing or rhonchi.  No increased work of breathing. Cardiovascular system: S1 & S2 heard, RRR. No JVD, murmurs, rubs, gallops or clicks. No pedal edema.  Telemetry personally reviewed: ST 100-110's Gastrointestinal system: Abdomen is nondistended, soft and nontender. No organomegaly or masses felt. Normal bowel sounds heard. Central nervous system: Alert and oriented. No focal neurological deficits. Extremities: Quadriparetic with grade at least 3 x 5 power in all limbs and 4 x 5 power in left upper extremity. Skin: No rashes, lesions or ulcers Psychiatry: Judgement and insight appear normal. Mood & affect appropriate.     Data Reviewed:   I have personally reviewed following labs and imaging studies   CBC: Recent Labs  Lab  04/23/24 0509 04/24/24 1113 04/25/24 0356  WBC 19.8* 16.1* 15.2*  HGB 10.4* 9.5* 10.2*  HCT 34.9* 31.7* 34.6*  MCV 100.0 97.8 98.6  PLT 217 242 245    Basic Metabolic Panel: Recent Labs  Lab 04/20/24 0511 04/21/24 0943 04/22/24 0627 04/23/24 0509 04/24/24 1113 04/25/24 0356 04/26/24 0518  NA 152*   < > 144 145 142 141 143  K 3.5    < > 3.6 4.6 4.1 4.3 3.9  CL 114*   < > 109 111 108 110 108  CO2 24   < > 24 19* 23 20* 23  GLUCOSE 131*   < > 105* 87 128* 79 104*  BUN 92*   < > 73* 71* 66* 65* 62*  CREATININE 2.04*   < > 1.45* 1.62* 1.65* 1.58* 1.66*  CALCIUM 7.8*   < > 7.9* 8.2* 7.8* 8.2* 8.1*  MG 2.0  --   --   --  1.8  --   --   PHOS 5.3*   < > 3.9 4.2 4.5 4.9* 5.6*   < > = values in this interval not displayed.    Liver Function Tests: Recent Labs  Lab 04/22/24 0627 04/23/24 0509 04/24/24 1113 04/25/24 0356 04/26/24 0518  AST  --   --  22  --   --   ALT  --   --  <5  --   --   ALKPHOS  --   --  136*  --   --   BILITOT  --   --  1.1  --   --   PROT  --   --  6.7  --   --   ALBUMIN 1.6* 1.8* 1.5* 1.8* 2.3*    CBG: Recent Labs  Lab 04/25/24 1628 04/25/24 2054 04/26/24 0743  GLUCAP 100* 141* 96    Microbiology Studies:   No results found for this or any previous visit (from the past 240 hours).   Radiology Studies:  DG Chest Port 1 View Result Date: 04/26/2024 CLINICAL DATA:  Hypoxia. EXAM: PORTABLE CHEST 1 VIEW COMPARISON:  04/25/2024 FINDINGS: Patchy and nodular airspace disease is seen bilaterally with areas of apparent cystic or cavitary change. The cardio pericardial silhouette is enlarged. No acute bony abnormality. Telemetry leads overlie the chest. IMPRESSION: Patchy and nodular airspace disease bilaterally with areas of apparent cystic or cavitary change. No substantial interval change. Electronically Signed   By: Camellia Candle M.D.   On: 04/26/2024 07:28   VAS US  LOWER EXTREMITY VENOUS (DVT) Result Date: 04/25/2024  Lower Venous DVT Study Patient Name:  [name] [name] [facility]  Date of Exam:   04/25/2024 Medical Rec #: [id number]         Accession #:    [id number] Date of Birth: 06-Sep-1975         Patient Gender: M Patient Age:   23 years Exam Location:  [facility] Procedure:      VAS US  LOWER EXTREMITY VENOUS (DVT) Referring Phys: STEPHANIE REESE  --------------------------------------------------------------------------------  Indications: Edema.  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 Comparison Study: No prior study on file Performing Technologist: Alberta Lis RVS  Examination Guidelines: A complete evaluation includes B-mode imaging, spectral Doppler, color Doppler, and power Doppler as needed of all accessible portions of each vessel. Bilateral testing is considered an integral part of a complete examination. Limited examinations for reoccurring indications may be performed as noted. The reflux portion of the exam is performed with the patient in reverse  Trendelenburg.  +---------+---------------+---------+-----------+----------+--------------+ RIGHT    CompressibilityPhasicitySpontaneityPropertiesThrombus Aging +---------+---------------+---------+-----------+----------+--------------+ CFV      Full           Yes      Yes                                 +---------+---------------+---------+-----------+----------+--------------+ SFJ      Full                                                        +---------+---------------+---------+-----------+----------+--------------+ FV Prox  Full           Yes      Yes                                 +---------+---------------+---------+-----------+----------+--------------+ FV Mid   Full                                                        +---------+---------------+---------+-----------+----------+--------------+ FV DistalFull                                                        +---------+---------------+---------+-----------+----------+--------------+ PFV      Full           Yes      Yes                                 +---------+---------------+---------+-----------+----------+--------------+ POP      Full           Yes      Yes                                  +---------+---------------+---------+-----------+----------+--------------+ PTV      Full                                                        +---------+---------------+---------+-----------+----------+--------------+ PERO     Full                                                        +---------+---------------+---------+-----------+----------+--------------+ Gastroc  Full                                                        +---------+---------------+---------+-----------+----------+--------------+   +---------+---------------+---------+-----------+----------+--------------+  LEFT     CompressibilityPhasicitySpontaneityPropertiesThrombus Aging +---------+---------------+---------+-----------+----------+--------------+ CFV      Full           Yes      Yes                                 +---------+---------------+---------+-----------+----------+--------------+ SFJ      Full                                                        +---------+---------------+---------+-----------+----------+--------------+ FV Prox  Full                                                        +---------+---------------+---------+-----------+----------+--------------+ FV Mid   Full                                                        +---------+---------------+---------+-----------+----------+--------------+ FV DistalFull           Yes      Yes                                 +---------+---------------+---------+-----------+----------+--------------+ PFV      Full                                                        +---------+---------------+---------+-----------+----------+--------------+ POP      Full           Yes      Yes                                 +---------+---------------+---------+-----------+----------+--------------+ PTV      Full                                                         +---------+---------------+---------+-----------+----------+--------------+ PERO     Full                                                        +---------+---------------+---------+-----------+----------+--------------+ Gastroc  Full                                                        +---------+---------------+---------+-----------+----------+--------------+  Summary: RIGHT: - There is no evidence of deep vein thrombosis in the lower extremity. However, portions of this examination were limited- see technologist comments above.  - No cystic structure found in the popliteal fossa. Interstitial edema noted throughout the extremity  LEFT: - There is no evidence of deep vein thrombosis in the lower extremity.  - No cystic structure found in the popliteal fossa. Interstitial edema noted throughout the extremity.  *See table(s) above for measurements and observations. Electronically signed by Fonda Rim on 04/25/2024 at 4:34:13 PM.    Final    DG CHEST PORT 1 VIEW Result Date: 04/25/2024 EXAM: 1 VIEW(S) XRAY OF THE CHEST 04/25/2024 01:13:00 PM COMPARISON: CT dated 04/23/2024. CLINICAL HISTORY: Dyspnea FINDINGS: LINES, TUBES AND DEVICES: Multiple wires and leads project over the chest on the frontal radiograph. LUNGS AND PLEURA: Slightly improved left sided aeration. Increased right sided patchy airspace disease with underlying cavitary nodules. Suspicious small left pleural effusion. Mild right hemidiaphragm elevation. No pneumothorax. HEART AND MEDIASTINUM: Mild cardiomegaly. BONES AND SOFT TISSUES: No acute osseous abnormality. IMPRESSION: 1. Improved left and worsened right sided aeration. Multifocal pneumonia and cavitary nodules persist. 2. Possible  small left pleural effusion. Electronically signed by: Rockey Kilts MD 04/25/2024 01:56 PM EST RP Workstation: [id number]   CT CERVICAL SPINE WO CONTRAST Result Date: 04/25/2024 CLINICAL DATA:  Initial evaluation for neck trauma. EXAM: CT  CERVICAL SPINE WITHOUT CONTRAST TECHNIQUE: Multidetector CT imaging of the cervical spine was performed without intravenous contrast. Multiplanar CT image reconstructions were also generated. RADIATION DOSE REDUCTION: This exam was performed according to the departmental dose-optimization program which includes automated exposure control, adjustment of the mA and/or kV according to patient size and/or use of iterative reconstruction technique. COMPARISON:  Prior MRI from 04/23/2024. FINDINGS: Alignment: Examination degraded by motion artifact, limiting assessment. Straightening of the normal cervical lordosis.  No listhesis. Skull base and vertebrae: Again seen is abnormal widening of the atlantodental interval measuring up to 10 mm. [place] a an dens interval also widened up to 15 mm. Findings related to previously identified septic arthritis seen on prior MRI. Associated mild basilar invagination better appreciated on prior MRI. No other evidence for osteomyelitis discitis or septic arthritis elsewhere within the cervical spine by CT. There is an apparent oblique lucency traversing the right articular facet of T1 (series 7, image 47). While this finding is favored to reflect a nutrient foramen and/or artifact on this motion degraded exam, a possible subtle acute nondisplaced fracture is difficult to exclude. No other visible acute fracture. Vertebral body height maintained. No worrisome osseous lesions. Soft tissues and spinal canal: Paraspinous soft tissues demonstrate no acute abnormality. Right thyroid lobe hypoplastic and/or absent. No visible epidural collections. Disc levels: No significant cervical spondylosis for age. No appreciable spinal stenosis. Upper chest: Few scattered patchy and nodular densities within the visualized lungs, likely related to known history of pneumonia and/or septic emboli. Other: None. IMPRESSION: 1. Motion degraded exam. 2. Abnormal widening of the atlantodental and basion-dens  intervals secondary to acute septic arthritis, stable from prior MRI. 3. Apparent oblique lucency traversing the right articular facet of T1 as above. While this finding is favored to reflect a nutrient foramen and/or artifact on this motion degraded exam, a possible subtle acute nondisplaced fracture is difficult to exclude (has there been a traumatic event since recent MRI from 1 day prior?). Correlation with physical exam for possible pain at this location recommended. Correlation with repeat MRI could be performed for further evaluation as warranted.  4. Few scattered patchy and nodular densities within the visualized lungs, likely related to known history of pneumonia and/or septic emboli. Electronically Signed   By: Morene Hoard M.D.   On: 04/25/2024 02:30    Scheduled Meds:    ALPRAZolam   1 mg Oral Q8H   Chlorhexidine Gluconate Cloth  6 each Topical Daily   cloNIDine  0.1 mg Oral TID   feeding supplement  237 mL Oral BID BM   folic acid   1 mg Oral Daily   furosemide  60 mg Intravenous Q8H   gabapentin  100 mg Oral BID   guaiFENesin   1,200 mg Oral BID   heparin  injection (subcutaneous)  5,000 Units Subcutaneous Q8H   lidocaine   1 patch Transdermal Q24H   linezolid   600 mg Oral Q12H   methocarbamol  500 mg Oral TID   multivitamin with minerals  1 tablet Oral Daily   nutrition supplement (JUVEN)  1 packet Oral BID BM   oxyCODONE   5 mg Oral Q6H    Continuous Infusions:    ampicillin-sulbactam (UNASYN) IV 3 g (04/26/24 1312)     LOS: 12 days     Trenda Mar, MD,  FACP, [facility], [facility], [facility]   [facility]       To contact the attending provider between 7A-7P or the covering provider during after hours 7P-7A, please log into the web site [email or web address] and access using universal  password for that web site. If you do not have the password, please call the hospital operator.  04/26/2024, 2:24 PM

## 2024-04-27 DIAGNOSIS — J189 Pneumonia, unspecified organism: Secondary | ICD-10-CM

## 2024-04-27 LAB — COMPREHENSIVE METABOLIC PANEL WITH GFR
ALT: <5 U/L (ref 0–44)
AST: 14 U/L — ABNORMAL LOW (ref 15–41)
Albumin: 2 g/dL — ABNORMAL LOW (ref 3.5–5.0)
Alkaline Phosphatase: 109 U/L (ref 38–126)
Anion gap: 17 — ABNORMAL HIGH (ref 5–15)
BUN: 59 mg/dL — ABNORMAL HIGH (ref 6–20)
CO2: 21 mmol/L — ABNORMAL LOW (ref 22–32)
Calcium: 8.2 mg/dL — ABNORMAL LOW (ref 8.9–10.3)
Chloride: 108 mmol/L (ref 98–111)
Creatinine, Ser: 1.72 mg/dL — ABNORMAL HIGH (ref 0.61–1.24)
GFR, Estimated: 49 mL/min — ABNORMAL LOW (ref >60)
Glucose, Bld: 98 mg/dL (ref 70–99)
Potassium: 3.5 mmol/L (ref 3.5–5.1)
Sodium: 146 mmol/L — ABNORMAL HIGH (ref 135–145)
Total Bilirubin: 1.5 mg/dL — ABNORMAL HIGH (ref 0.0–1.2)
Total Protein: 6.4 g/dL — ABNORMAL LOW (ref 6.5–8.1)

## 2024-04-27 LAB — CBC
HCT: 25.3 % — ABNORMAL LOW (ref 39.0–52.0)
Hemoglobin: 7.8 g/dL — ABNORMAL LOW (ref 13.0–17.0)
MCH: 29.7 pg (ref 26.0–34.0)
MCHC: 30.8 g/dL (ref 30.0–36.0)
MCV: 96.2 fL (ref 80.0–100.0)
Platelets: 202 K/uL (ref 150–400)
RBC: 2.63 MIL/uL — ABNORMAL LOW (ref 4.22–5.81)
RDW: 17 % — ABNORMAL HIGH (ref 11.5–15.5)
WBC: 10.7 K/uL — ABNORMAL HIGH (ref 4.0–10.5)
nRBC: 0 % (ref 0.0–0.2)

## 2024-04-27 MED ORDER — LIDOCAINE HCL URETHRAL/MUCOSAL 2 % EX GEL
1.0000 | Freq: Once | CUTANEOUS | Status: AC
  Administered 2024-04-28: 1 via URETHRAL
  Filled 2024-04-27: qty 6

## 2024-04-27 MED ORDER — METOLAZONE 2.5 MG PO TABS
2.5000 mg | ORAL_TABLET | Freq: Once | ORAL | Status: AC
  Administered 2024-04-27: 2.5 mg via ORAL
  Filled 2024-04-27: qty 1

## 2024-04-27 MED ORDER — ALPRAZOLAM 0.5 MG PO TABS
0.7500 mg | ORAL_TABLET | Freq: Three times a day (TID) | ORAL | Status: DC
  Administered 2024-04-27 (×3): 0.75 mg via ORAL
  Filled 2024-04-27 (×3): qty 1

## 2024-04-27 MED ORDER — FUROSEMIDE 10 MG/ML IJ SOLN
60.0000 mg | Freq: Two times a day (BID) | INTRAMUSCULAR | Status: DC
  Administered 2024-04-27: 60 mg via INTRAVENOUS
  Filled 2024-04-27: qty 6

## 2024-04-27 NOTE — Progress Notes (Addendum)
 PROGRESS NOTE   [name] [name]  FMW:[id number]    DOB: 1976-03-27    DOA: 04/14/2024  PCP: Bertell Satterfield, MD   I have briefly reviewed patients previous medical records in [facility].   Brief Hospital Course:  48 year old male with history of drug use in the past, prior MSSA pneumonia and MSSA bacteremia, lumbar osteomyelitis admitted 10/2-10/13 2025 (left AMA) who came back to the hospital on 11/2 with altered mental status, covered in feces, poor airway protection and had to be intubated. He was transferred to [facility] and admitted to the ICU. He was found to have recurrent MSSA bacteremia, tricuspid valve endocarditis, multi spine level discitis/osteomyelitis/abscesses, septic pulmonary emboli, MSSA pneumonia with intractable pain.  Neurosurgery and ID was consulted.  Treating nonsurgically.  Course complicated on 11/13 with tachypnea, tachycardia and dyspnea.  CCM had transferred to [facility] and signed off on 11/11, reconsulted them on 11/13.  Diuresing with IV Lasix.  If patient decompensates, recommend prompt bedside evaluation and low threshold to transfer to ICU and consult PCCM.   Assessment & Plan:   Severe sepsis, POA Due to MSSA bacteremia with tricuspid valve endocarditis Complicated by discitis/osteomyelitis at the lumbar spine level, recent psoas abscess, septic pulmonary emboli/MSSA pneumonia Intractable back pain Blood cultures 11/2: MSSA bacteremia.  Imaging this hospitalization showed septic pulmonary emboli, an MRI done during prior hospitalization on 10/3 showed discitis/osteomyelitis L3-L4, L5-S1 with a septic right facet joint at L5-S1.  There was also paraspinal inflammation with small bilateral psoas abscesses - 2D echo: tricuspid valve mobile masses.  CTS recommended medical management and no role for angio vac. 11/11, MRI C/T/L-spine: Refer to detailed reports for multiple acute and chronic problems.  Importantly, septic arthritis involving the  atlantodental articulation at the skull base and a superimposed 2.1 cm intra-articular fluid collection, acute OM/discitis T5-6, OM/discitis at L3-4 and L5-S1 mildly progressed from 10/3, paraspinous inflammatory changes with superimposed left psoas abscess, septic arthritis L5-S1 facet progressed from prior, associated 2 cm soft tissue abscess within the adjacent posterior paraspinal soft tissue, septic arthritis L3-4 facet with 1.1 cm soft tissue abscess and adjacent posterior paraspinal soft tissue Neurosurgery see me 11/12: They recommend, L-spine: Nonoperative treatment with antibiotics, LSO brace for comfort if needed for back pain when upright; C-spine: Currently not a surgical candidate but may need surgery in the future, recommend [place] J collar.  Follow-up with Dr. Darnella in 6 weeks. Multimodality pain control.  Adjusted pain meds.  Added lidocaine  patch, gabapentin 100 mg twice daily and changed Robaxin to scheduled 5 mg 3 times daily.  Pain appears to be somewhat better controlled. ID following for antimicrobial recommendations.  Was on Ancef  but broadened to IV Zosyn to cover for HCAP.  MRSA PCR negative, linezolid  could be stopped.   Acute hypoxic respiratory failure -intubated initially, extubated 11/9.   11/13, clinically worsened.  PCCM consulted.  Lower extremity venous Dopplers negative for DVT.  Diuresing aggressively with IV Lasix with improvement.  Serum sodium has increased to 146.  Discussed with Dr. Toribio Sharps, PCCM, recommends reducing IV Lasix from 60 mg 3 times daily to 60 mg twice daily and give a dose of metolazone 2.5 mg.   Acute metabolic encephalopathy AMS had resolved.  However remains at risk for worsening due to polypharmacy.  Judicious use of opioids and sedatives.  Cut back on Xanax  from 1 mg 3 times daily to 0.75 mg 3 times daily.  As per home med rec review, no records of  where he was getting these medications.   History of substance abuse Per prior chart review,  clean for several months, has not done any drugs, not drinking, but smoking cigarettes still.   Acute kidney injury  -likely in the setting of severe sepsis, creatinine on admission at 4.0, creatinine slowly trending down as noted below.  Had a degree of ATN from his sepsis picture, will continue to monitor as he may have a degree of chronic kidney disease moving forward.  Creatinine is bumped up from 1.58-1.66-1.7 post diuresis, monitor closely while on IV Lasix.   Hyperkalemia -resolved   Hypernatremia Serum sodium up to 146 today.  Giving a dose of metolazone along with IV Lasix.  Follow BMP in AM.   Elevated LFTs -due to sepsis, shock liver, no intra-abdominal abnormality on CT.  Improved.   Anemia Thrombocytopenia, resolved Hemoglobin is dropped from 9-10 g range to 7.8 in the absence of overt bleeding.?  Accuracy.?  Related to acute infection and illness.  Follow CBC in AM.  Transfuse for hemoglobin 7 g or less.   History of hypertension Intermittent mild hypertension.  Added as needed IV hydralazine.   History of anxiety Continue alprazolam , reduced dose as noted above.   Stage 2 pressure ulcer sacrum, present on admission  -Continue wound care.  Appreciate consultation  Sinus tachycardia Multifactorial due to pain, infection excetra.  Stable  Body mass index is 22.13 kg/m.   DVT prophylaxis: Place TED hose Start: 04/26/24 1238 heparin  injection 5,000 Units Start: 04/20/24 1400 SCDs Start: 04/14/24 2038     Code Status: Full Code:  Family Communication: Spouse via phone. Updated care and advised that he is critically ill.  Disposition:  Status is: Inpatient Remains inpatient appropriate because: IV Abx.  Remains quite ill.     Consultants:   Cardiothoracic surgery Infectious disease  Procedures:   As above  Subjective:  Denies dyspnea.  States that his breathing is excellent.  Pain during nursing care and position change, advised that his expected and he  stated that he was going to take it a day at a time.  Reassured.  No other complaints reported.  Objective:   Vitals:   04/27/24 0300 04/27/24 0500 04/27/24 0759 04/27/24 1213  BP: (!) 153/115  (!) 141/98 (!) 132/105  Pulse: (!) 117  (!) 105 (!) 118  Resp: 18  16 18   Temp: 99.2 F (37.3 C)  99.5 F (37.5 C) 98 F (36.7 C)  TempSrc: Oral  Oral Oral  SpO2: 95%  92% 98%  Weight:  74 kg    Height:        General exam: Young male, moderately built and frail, chronically ill looking looks improved compared to 2 days ago did not appear in any distress. Neck: Has Aspen neck collar Respiratory system: Slightly harsh breath sounds but no wheezing, rhonchi or crackles.  No increased work of breathing.  No tachypnea. Cardiovascular system: S1 & S2 heard, RRR. No JVD, murmurs, rubs, gallops or clicks. No pedal edema.  Telemetry personally reviewed: ST 100-120's Gastrointestinal system: Abdomen is nondistended, soft and nontender. No organomegaly or masses felt. Normal bowel sounds heard. Central nervous system: Alert and oriented. No focal neurological deficits. Extremities: Quadriparetic with grade at least 3 x 5 power in all limbs and 4 x 5 power in left upper extremity. Skin: No rashes, lesions or ulcers Psychiatry: Judgement and insight appear normal. Mood & affect appropriate.     Data Reviewed:   I have personally  reviewed following labs and imaging studies   CBC: Recent Labs  Lab 04/24/24 1113 04/25/24 0356 04/27/24 0345  WBC 16.1* 15.2* 10.7*  HGB 9.5* 10.2* 7.8*  HCT 31.7* 34.6* 25.3*  MCV 97.8 98.6 96.2  PLT 242 245 202    Basic Metabolic Panel: Recent Labs  Lab 04/22/24 0627 04/23/24 0509 04/24/24 1113 04/25/24 0356 04/26/24 0518 04/27/24 0345  NA 144 145 142 141 143 146*  K 3.6 4.6 4.1 4.3 3.9 3.5  CL 109 111 108 110 108 108  CO2 24 19* 23 20* 23 21*  GLUCOSE 105* 87 128* 79 104* 98  BUN 73* 71* 66* 65* 62* 59*  CREATININE 1.45* 1.62* 1.65* 1.58* 1.66*  1.72*  CALCIUM 7.9* 8.2* 7.8* 8.2* 8.1* 8.2*  MG  --   --  1.8  --   --   --   PHOS 3.9 4.2 4.5 4.9* 5.6*  --     Liver Function Tests: Recent Labs  Lab 04/23/24 0509 04/24/24 1113 04/25/24 0356 04/26/24 0518 04/27/24 0345  AST  --  22  --   --  14*  ALT  --  <5  --   --  <5  ALKPHOS  --  136*  --   --  109  BILITOT  --  1.1  --   --  1.5*  PROT  --  6.7  --   --  6.4*  ALBUMIN 1.8* 1.5* 1.8* 2.3* 2.0*    CBG: Recent Labs  Lab 04/25/24 1628 04/25/24 2054 04/26/24 0743  GLUCAP 100* 141* 96    Microbiology Studies:   No results found for this or any previous visit (from the past 240 hours).   Radiology Studies:  DG Chest Port 1 View Result Date: 04/26/2024 CLINICAL DATA:  Hypoxia. EXAM: PORTABLE CHEST 1 VIEW COMPARISON:  04/25/2024 FINDINGS: Patchy and nodular airspace disease is seen bilaterally with areas of apparent cystic or cavitary change. The cardio pericardial silhouette is enlarged. No acute bony abnormality. Telemetry leads overlie the chest. IMPRESSION: Patchy and nodular airspace disease bilaterally with areas of apparent cystic or cavitary change. No substantial interval change. Electronically Signed   By: Camellia Candle M.D.   On: 04/26/2024 07:28   VAS US  LOWER EXTREMITY VENOUS (DVT) Result Date: 04/25/2024  Lower Venous DVT Study Patient Name:  ALYSSA MANCERA [facility]  Date of Exam:   04/25/2024 Medical Rec #: [id number]         Accession #:    [id number] Date of Birth: 08-17-1975         Patient Gender: M Patient Age:   70 years Exam Location:  [facility] Procedure:      VAS US  LOWER EXTREMITY VENOUS (DVT) Referring Phys: STEPHANIE REESE --------------------------------------------------------------------------------  Indications: Edema.  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 Comparison Study: No prior study on file Performing Technologist: Alberta Lis RVS  Examination Guidelines: A complete evaluation includes B-mode imaging, spectral  Doppler, color Doppler, and power Doppler as needed of all accessible portions of each vessel. Bilateral testing is considered an integral part of a complete examination. Limited examinations for reoccurring indications may be performed as noted. The reflux portion of the exam is performed with the patient in reverse Trendelenburg.  +---------+---------------+---------+-----------+----------+--------------+ RIGHT    CompressibilityPhasicitySpontaneityPropertiesThrombus Aging +---------+---------------+---------+-----------+----------+--------------+ CFV      Full           Yes      Yes                                 +---------+---------------+---------+-----------+----------+--------------+  SFJ      Full                                                        +---------+---------------+---------+-----------+----------+--------------+ FV Prox  Full           Yes      Yes                                 +---------+---------------+---------+-----------+----------+--------------+ FV Mid   Full                                                        +---------+---------------+---------+-----------+----------+--------------+ FV DistalFull                                                        +---------+---------------+---------+-----------+----------+--------------+ PFV      Full           Yes      Yes                                 +---------+---------------+---------+-----------+----------+--------------+ POP      Full           Yes      Yes                                 +---------+---------------+---------+-----------+----------+--------------+ PTV      Full                                                        +---------+---------------+---------+-----------+----------+--------------+ PERO     Full                                                        +---------+---------------+---------+-----------+----------+--------------+ Gastroc  Full                                                         +---------+---------------+---------+-----------+----------+--------------+   +---------+---------------+---------+-----------+----------+--------------+ LEFT     CompressibilityPhasicitySpontaneityPropertiesThrombus Aging +---------+---------------+---------+-----------+----------+--------------+ CFV      Full           Yes      Yes                                 +---------+---------------+---------+-----------+----------+--------------+  SFJ      Full                                                        +---------+---------------+---------+-----------+----------+--------------+ FV Prox  Full                                                        +---------+---------------+---------+-----------+----------+--------------+ FV Mid   Full                                                        +---------+---------------+---------+-----------+----------+--------------+ FV DistalFull           Yes      Yes                                 +---------+---------------+---------+-----------+----------+--------------+ PFV      Full                                                        +---------+---------------+---------+-----------+----------+--------------+ POP      Full           Yes      Yes                                 +---------+---------------+---------+-----------+----------+--------------+ PTV      Full                                                        +---------+---------------+---------+-----------+----------+--------------+ PERO     Full                                                        +---------+---------------+---------+-----------+----------+--------------+ Gastroc  Full                                                        +---------+---------------+---------+-----------+----------+--------------+     Summary: RIGHT: - There is no evidence of deep vein thrombosis  in the lower extremity. However, portions of this examination were limited- see technologist comments above.  - No cystic structure found in the popliteal fossa. Interstitial edema noted throughout the extremity  LEFT: - There is no evidence of deep vein thrombosis in the  lower extremity.  - No cystic structure found in the popliteal fossa. Interstitial edema noted throughout the extremity.  *See table(s) above for measurements and observations. Electronically signed by Fonda Rim on 04/25/2024 at 4:34:13 PM.    Final     Scheduled Meds:    ALPRAZolam   0.75 mg Oral Q8H   Chlorhexidine Gluconate Cloth  6 each Topical Daily   cloNIDine  0.1 mg Oral TID   feeding supplement  237 mL Oral BID BM   folic acid   1 mg Oral Daily   furosemide  60 mg Intravenous Q12H   gabapentin  100 mg Oral BID   guaiFENesin   1,200 mg Oral BID   heparin  injection (subcutaneous)  5,000 Units Subcutaneous Q8H   lidocaine   1 patch Transdermal Q24H   linezolid   600 mg Oral Q12H   methocarbamol  500 mg Oral TID   metolazone  2.5 mg Oral Once   multivitamin with minerals  1 tablet Oral Daily   nutrition supplement (JUVEN)  1 packet Oral BID BM   oxyCODONE   5 mg Oral Q6H    Continuous Infusions:    piperacillin-tazobactam (ZOSYN)  IV 3.375 g (04/27/24 0929)     LOS: 13 days     Trenda Mar, MD,  FACP, [facility], [facility], [facility]   [facility] [place]      To contact the attending provider between 7A-7P or the covering provider during after hours 7P-7A, please log into the web site [email or web address] and access using universal [place] password for that web site. If you do not have the password, please call the hospital operator.  04/27/2024, 1:33 PM

## 2024-04-28 ENCOUNTER — Inpatient Hospital Stay (HOSPITAL_COMMUNITY): Payer: MEDICAID

## 2024-04-28 DIAGNOSIS — J9602 Acute respiratory failure with hypercapnia: Secondary | ICD-10-CM

## 2024-04-28 DIAGNOSIS — I959 Hypotension, unspecified: Secondary | ICD-10-CM

## 2024-04-28 LAB — LACTIC ACID, PLASMA: Lactic Acid, Venous: 1.5 mmol/L (ref 0.5–1.9)

## 2024-04-28 LAB — URINALYSIS, ROUTINE W REFLEX MICROSCOPIC
Bilirubin Urine: NEGATIVE
Glucose, UA: NEGATIVE mg/dL
Ketones, ur: NEGATIVE mg/dL
Leukocytes,Ua: NEGATIVE
Nitrite: NEGATIVE
Protein, ur: 100 mg/dL — AB
RBC / HPF: >50 RBC/hpf (ref 0–5)
Specific Gravity, Urine: 1.011 (ref 1.005–1.030)
pH: 5 (ref 5.0–8.0)

## 2024-04-28 LAB — CBC
HCT: 29.7 % — ABNORMAL LOW (ref 39.0–52.0)
Hemoglobin: 8.5 g/dL — ABNORMAL LOW (ref 13.0–17.0)
MCH: 29.4 pg (ref 26.0–34.0)
MCHC: 28.6 g/dL — ABNORMAL LOW (ref 30.0–36.0)
MCV: 102.8 fL — ABNORMAL HIGH (ref 80.0–100.0)
Platelets: 152 K/uL (ref 150–400)
RBC: 2.89 MIL/uL — ABNORMAL LOW (ref 4.22–5.81)
RDW: 16.9 % — ABNORMAL HIGH (ref 11.5–15.5)
WBC: 13.2 K/uL — ABNORMAL HIGH (ref 4.0–10.5)
nRBC: 0 % (ref 0.0–0.2)

## 2024-04-28 LAB — BLOOD GAS, ARTERIAL
Acid-base deficit: 0.1 mmol/L (ref 0.0–2.0)
Bicarbonate: 30.1 mmol/L — ABNORMAL HIGH (ref 20.0–28.0)
Bicarbonate: 26.2 mmol/L (ref 20.0–28.0)
Drawn by: 59094
Drawn by: 59094
O2 Saturation: 100 %
O2 Saturation: 98.9 %
Patient temperature: 36.8
Patient temperature: 36.3
pCO2 arterial: 76 mmHg (ref 32–48)
pCO2 arterial: 55 mmHg — ABNORMAL HIGH (ref 32–48)
pH, Arterial: 7.2 — ABNORMAL LOW (ref 7.35–7.45)
pH, Arterial: 7.28 — ABNORMAL LOW (ref 7.35–7.45)
pO2, Arterial: 119 mmHg — ABNORMAL HIGH (ref 83–108)
pO2, Arterial: 200 mmHg — ABNORMAL HIGH (ref 83–108)

## 2024-04-28 LAB — BASIC METABOLIC PANEL WITH GFR
Anion gap: 14 (ref 5–15)
BUN: 67 mg/dL — ABNORMAL HIGH (ref 6–20)
CO2: 21 mmol/L — ABNORMAL LOW (ref 22–32)
Calcium: 8.1 mg/dL — ABNORMAL LOW (ref 8.9–10.3)
Chloride: 109 mmol/L (ref 98–111)
Creatinine, Ser: 2.2 mg/dL — ABNORMAL HIGH (ref 0.61–1.24)
GFR, Estimated: 36 mL/min — ABNORMAL LOW (ref >60)
Glucose, Bld: 113 mg/dL — ABNORMAL HIGH (ref 70–99)
Potassium: 4.2 mmol/L (ref 3.5–5.1)
Sodium: 144 mmol/L (ref 135–145)

## 2024-04-28 LAB — MAGNESIUM: Magnesium: 1.8 mg/dL (ref 1.7–2.4)

## 2024-04-28 LAB — GLUCOSE, CAPILLARY
Glucose-Capillary: 125 mg/dL — ABNORMAL HIGH (ref 70–99)
Glucose-Capillary: 121 mg/dL — ABNORMAL HIGH (ref 70–99)
Glucose-Capillary: 131 mg/dL — ABNORMAL HIGH (ref 70–99)
Glucose-Capillary: 126 mg/dL — ABNORMAL HIGH (ref 70–99)
Glucose-Capillary: 82 mg/dL (ref 70–99)
Glucose-Capillary: 105 mg/dL — ABNORMAL HIGH (ref 70–99)

## 2024-04-28 LAB — MRSA NEXT GEN BY PCR, NASAL: MRSA by PCR Next Gen: NOT DETECTED

## 2024-04-28 LAB — PHOSPHORUS: Phosphorus: 8.1 mg/dL — ABNORMAL HIGH (ref 2.5–4.6)

## 2024-04-28 MED ORDER — ALPRAZOLAM 0.5 MG PO TABS
0.7500 mg | ORAL_TABLET | Freq: Three times a day (TID) | ORAL | Status: DC
  Administered 2024-04-28 – 2024-05-01 (×9): 0.75 mg
  Filled 2024-04-28 (×9): qty 2

## 2024-04-28 MED ORDER — FENTANYL CITRATE (PF) 50 MCG/ML IJ SOSY
PREFILLED_SYRINGE | INTRAMUSCULAR | Status: AC
  Filled 2024-04-28: qty 2

## 2024-04-28 MED ORDER — FREE WATER
200.0000 mL | Freq: Four times a day (QID) | Status: DC
  Administered 2024-04-28 – 2024-05-08 (×38): 200 mL

## 2024-04-28 MED ORDER — DEXMEDETOMIDINE HCL IN NACL 400 MCG/100ML IV SOLN
0.0000 ug/kg/h | INTRAVENOUS | Status: DC
  Administered 2024-04-28: 0.4 ug/kg/h via INTRAVENOUS
  Filled 2024-04-28: qty 100

## 2024-04-28 MED ORDER — PIPERACILLIN-TAZOBACTAM 3.375 G IVPB 30 MIN
3.3750 g | Freq: Once | INTRAVENOUS | Status: AC
  Administered 2024-04-28: 3.375 g via INTRAVENOUS
  Filled 2024-04-28: qty 50

## 2024-04-28 MED ORDER — KETAMINE HCL 50 MG/5ML IJ SOSY
PREFILLED_SYRINGE | INTRAMUSCULAR | Status: AC
  Filled 2024-04-28: qty 10

## 2024-04-28 MED ORDER — OXYCODONE HCL 5 MG PO TABS
5.0000 mg | ORAL_TABLET | Freq: Four times a day (QID) | ORAL | Status: DC
Start: 2024-04-28 — End: 2024-05-04
  Administered 2024-04-28 – 2024-05-04 (×24): 5 mg
  Filled 2024-04-28 (×24): qty 1

## 2024-04-28 MED ORDER — NOREPINEPHRINE 4 MG/250ML-% IV SOLN
INTRAVENOUS | Status: AC
  Filled 2024-04-28: qty 250

## 2024-04-28 MED ORDER — PROPOFOL 1000 MG/100ML IV EMUL
0.0000 ug/kg/min | INTRAVENOUS | Status: DC
  Administered 2024-04-28: 10 ug/kg/min via INTRAVENOUS
  Administered 2024-04-29: 15 ug/kg/min via INTRAVENOUS
  Administered 2024-04-29 (×2): 20 ug/kg/min via INTRAVENOUS
  Administered 2024-04-30 – 2024-05-01 (×2): 15 ug/kg/min via INTRAVENOUS
  Administered 2024-05-01: 20 ug/kg/min via INTRAVENOUS
  Administered 2024-05-01: 15 ug/kg/min via INTRAVENOUS
  Filled 2024-04-28 (×9): qty 100

## 2024-04-28 MED ORDER — POLYETHYLENE GLYCOL 3350 17 G PO PACK
17.0000 g | PACK | Freq: Every day | ORAL | Status: DC | PRN
  Administered 2024-04-30: 17 g
  Filled 2024-04-28: qty 1

## 2024-04-28 MED ORDER — VASOPRESSIN 20 UNITS/100 ML INFUSION FOR SHOCK
0.0000 [IU]/min | INTRAVENOUS | Status: DC
  Administered 2024-04-28: 0.04 [IU]/min via INTRAVENOUS
  Filled 2024-04-28: qty 100

## 2024-04-28 MED ORDER — NOREPINEPHRINE 4 MG/250ML-% IV SOLN
0.0000 ug/min | INTRAVENOUS | Status: DC
  Administered 2024-04-28: 5 ug/min via INTRAVENOUS

## 2024-04-28 MED ORDER — PHENYLEPHRINE 80 MCG/ML (10ML) SYRINGE FOR IV PUSH (FOR BLOOD PRESSURE SUPPORT)
80.0000 ug | PREFILLED_SYRINGE | Freq: Once | INTRAVENOUS | Status: AC
  Administered 2024-04-28: 80 ug via INTRAVENOUS

## 2024-04-28 MED ORDER — FENTANYL CITRATE (PF) 50 MCG/ML IJ SOSY
50.0000 ug | PREFILLED_SYRINGE | Freq: Once | INTRAMUSCULAR | Status: AC
  Administered 2024-04-28: 50 ug via INTRAVENOUS

## 2024-04-28 MED ORDER — PIPERACILLIN-TAZOBACTAM 3.375 G IVPB
3.3750 g | Freq: Three times a day (TID) | INTRAVENOUS | Status: DC
  Administered 2024-04-28 – 2024-04-30 (×6): 3.375 g via INTRAVENOUS
  Filled 2024-04-28 (×6): qty 50

## 2024-04-28 MED ORDER — ADULT MULTIVITAMIN W/MINERALS CH
1.0000 | ORAL_TABLET | Freq: Every day | ORAL | Status: DC
  Administered 2024-04-29 – 2024-05-23 (×24): 1
  Filled 2024-04-28 (×25): qty 1

## 2024-04-28 MED ORDER — LACTATED RINGERS IV BOLUS
1000.0000 mL | Freq: Once | INTRAVENOUS | Status: AC
  Administered 2024-04-28: 1000 mL via INTRAVENOUS

## 2024-04-28 MED ORDER — PROSOURCE TF20 ENFIT COMPATIBL EN LIQD
60.0000 mL | Freq: Every day | ENTERAL | Status: DC
  Administered 2024-04-28 – 2024-05-15 (×17): 60 mL
  Filled 2024-04-28 (×17): qty 60

## 2024-04-28 MED ORDER — SUCCINYLCHOLINE CHLORIDE 200 MG/10ML IV SOSY
PREFILLED_SYRINGE | INTRAVENOUS | Status: AC
  Filled 2024-04-28: qty 10

## 2024-04-28 MED ORDER — NOREPINEPHRINE 16 MG/250ML-% IV SOLN
0.0000 ug/min | INTRAVENOUS | Status: DC
Start: 2024-04-28 — End: 2024-05-03
  Administered 2024-04-28: 10 ug/min via INTRAVENOUS
  Filled 2024-04-28 (×2): qty 250

## 2024-04-28 MED ORDER — FENTANYL CITRATE (PF) 50 MCG/ML IJ SOSY
50.0000 ug | PREFILLED_SYRINGE | INTRAMUSCULAR | Status: DC | PRN
  Administered 2024-05-06 – 2024-05-07 (×2): 50 ug via INTRAVENOUS
  Filled 2024-04-28 (×3): qty 1

## 2024-04-28 MED ORDER — FENTANYL CITRATE (PF) 50 MCG/ML IJ SOSY
50.0000 ug | PREFILLED_SYRINGE | INTRAMUSCULAR | Status: DC | PRN
  Administered 2024-04-29 – 2024-04-30 (×3): 50 ug via INTRAVENOUS
  Administered 2024-05-01 – 2024-05-03 (×8): 100 ug via INTRAVENOUS
  Administered 2024-05-03 (×2): 50 ug via INTRAVENOUS
  Administered 2024-05-03 (×2): 100 ug via INTRAVENOUS
  Administered 2024-05-03 – 2024-05-04 (×2): 50 ug via INTRAVENOUS
  Administered 2024-05-04 (×2): 100 ug via INTRAVENOUS
  Administered 2024-05-06 (×2): 50 ug via INTRAVENOUS
  Administered 2024-05-06: 100 ug via INTRAVENOUS
  Administered 2024-05-06: 50 ug via INTRAVENOUS
  Administered 2024-05-07: 100 ug via INTRAVENOUS
  Administered 2024-05-07 (×3): 50 ug via INTRAVENOUS
  Administered 2024-05-07: 100 ug via INTRAVENOUS
  Filled 2024-04-28 (×3): qty 1
  Filled 2024-04-28 (×2): qty 2
  Filled 2024-04-28: qty 1
  Filled 2024-04-28 (×2): qty 2
  Filled 2024-04-28: qty 1
  Filled 2024-04-28: qty 2
  Filled 2024-04-28 (×2): qty 1
  Filled 2024-04-28 (×2): qty 2
  Filled 2024-04-28: qty 1
  Filled 2024-04-28 (×4): qty 2
  Filled 2024-04-28: qty 1
  Filled 2024-04-28 (×4): qty 2
  Filled 2024-04-28: qty 1
  Filled 2024-04-28: qty 2
  Filled 2024-04-28 (×2): qty 1

## 2024-04-28 MED ORDER — MIDAZOLAM HCL 2 MG/2ML IJ SOLN
INTRAMUSCULAR | Status: AC
Start: 2024-04-28 — End: 2024-04-28
  Filled 2024-04-28: qty 2

## 2024-04-28 MED ORDER — PHENYLEPHRINE 80 MCG/ML (10ML) SYRINGE FOR IV PUSH (FOR BLOOD PRESSURE SUPPORT)
PREFILLED_SYRINGE | INTRAVENOUS | Status: AC
  Filled 2024-04-28: qty 10

## 2024-04-28 MED ORDER — FUROSEMIDE 10 MG/ML IJ SOLN
60.0000 mg | Freq: Two times a day (BID) | INTRAMUSCULAR | Status: DC
  Filled 2024-04-28: qty 6

## 2024-04-28 MED ORDER — ROCURONIUM BROMIDE 10 MG/ML (PF) SYRINGE
PREFILLED_SYRINGE | INTRAVENOUS | Status: AC
  Filled 2024-04-28: qty 10

## 2024-04-28 MED ORDER — PHENYLEPHRINE 80 MCG/ML (10ML) SYRINGE FOR IV PUSH (FOR BLOOD PRESSURE SUPPORT)
80.0000 ug | PREFILLED_SYRINGE | Freq: Once | INTRAVENOUS | Status: AC | PRN
  Administered 2024-04-28: 80 ug via INTRAVENOUS

## 2024-04-28 MED ORDER — NALOXONE HCL 0.4 MG/ML IJ SOLN
0.4000 mg | Freq: Once | INTRAMUSCULAR | Status: AC
  Administered 2024-04-28: 0.4 mg via INTRAVENOUS

## 2024-04-28 MED ORDER — ORAL CARE MOUTH RINSE
15.0000 mL | OROMUCOSAL | Status: DC
  Administered 2024-04-28 – 2024-05-06 (×93): 15 mL via OROMUCOSAL

## 2024-04-28 MED ORDER — VITAL HP 1.0 CAL PO LIQD
1000.0000 mL | ORAL | Status: DC
  Administered 2024-04-28 – 2024-04-29 (×2): 1000 mL
  Filled 2024-04-28: qty 1000

## 2024-04-28 MED ORDER — ETOMIDATE 2 MG/ML IV SOLN
INTRAVENOUS | Status: AC
Start: 2024-04-28 — End: 2024-04-28
  Filled 2024-04-28: qty 20

## 2024-04-28 MED ORDER — KETAMINE HCL 50 MG/5ML IJ SOSY
100.0000 mg | PREFILLED_SYRINGE | Freq: Once | INTRAMUSCULAR | Status: AC
  Administered 2024-04-28: 100 mg via INTRAVENOUS

## 2024-04-28 MED ORDER — GABAPENTIN 250 MG/5ML PO SOLN
100.0000 mg | Freq: Two times a day (BID) | ORAL | Status: DC
  Administered 2024-04-28 – 2024-05-07 (×18): 100 mg
  Filled 2024-04-28 (×19): qty 2

## 2024-04-28 MED ORDER — GUAIFENESIN 100 MG/5ML PO LIQD
5.0000 mL | ORAL | Status: DC
  Administered 2024-04-28 – 2024-05-23 (×150): 5 mL
  Filled 2024-04-28 (×3): qty 10
  Filled 2024-04-28: qty 5
  Filled 2024-04-28: qty 10
  Filled 2024-04-28: qty 5
  Filled 2024-04-28 (×3): qty 10
  Filled 2024-04-28 (×3): qty 5
  Filled 2024-04-28 (×5): qty 10
  Filled 2024-04-28: qty 5
  Filled 2024-04-28 (×3): qty 10
  Filled 2024-04-28: qty 5
  Filled 2024-04-28: qty 10
  Filled 2024-04-28: qty 5
  Filled 2024-04-28 (×3): qty 10
  Filled 2024-04-28: qty 5
  Filled 2024-04-28 (×6): qty 10
  Filled 2024-04-28: qty 5
  Filled 2024-04-28: qty 10
  Filled 2024-04-28 (×2): qty 5
  Filled 2024-04-28: qty 10
  Filled 2024-04-28 (×4): qty 5
  Filled 2024-04-28 (×10): qty 10
  Filled 2024-04-28: qty 5
  Filled 2024-04-28: qty 15
  Filled 2024-04-28 (×2): qty 5
  Filled 2024-04-28 (×3): qty 10
  Filled 2024-04-28 (×2): qty 5
  Filled 2024-04-28 (×2): qty 10
  Filled 2024-04-28: qty 5
  Filled 2024-04-28 (×2): qty 10
  Filled 2024-04-28: qty 5
  Filled 2024-04-28 (×2): qty 10
  Filled 2024-04-28: qty 5
  Filled 2024-04-28: qty 15
  Filled 2024-04-28: qty 5
  Filled 2024-04-28 (×3): qty 10
  Filled 2024-04-28: qty 15
  Filled 2024-04-28 (×2): qty 10
  Filled 2024-04-28 (×2): qty 5
  Filled 2024-04-28 (×3): qty 10
  Filled 2024-04-28 (×2): qty 5
  Filled 2024-04-28: qty 15
  Filled 2024-04-28: qty 5
  Filled 2024-04-28: qty 10
  Filled 2024-04-28: qty 15
  Filled 2024-04-28: qty 5
  Filled 2024-04-28 (×3): qty 10
  Filled 2024-04-28: qty 5
  Filled 2024-04-28 (×4): qty 10
  Filled 2024-04-28: qty 5
  Filled 2024-04-28 (×6): qty 10
  Filled 2024-04-28: qty 5
  Filled 2024-04-28: qty 10
  Filled 2024-04-28: qty 5
  Filled 2024-04-28: qty 10
  Filled 2024-04-28: qty 5
  Filled 2024-04-28: qty 10
  Filled 2024-04-28 (×2): qty 5
  Filled 2024-04-28 (×3): qty 10
  Filled 2024-04-28: qty 5
  Filled 2024-04-28 (×3): qty 10
  Filled 2024-04-28: qty 15
  Filled 2024-04-28 (×2): qty 5
  Filled 2024-04-28 (×6): qty 10
  Filled 2024-04-28: qty 15
  Filled 2024-04-28 (×5): qty 10
  Filled 2024-04-28 (×3): qty 5
  Filled 2024-04-28 (×6): qty 10
  Filled 2024-04-28 (×3): qty 5
  Filled 2024-04-28: qty 10

## 2024-04-28 MED ORDER — MIDAZOLAM HCL (PF) 2 MG/2ML IJ SOLN
2.0000 mg | Freq: Once | INTRAMUSCULAR | Status: AC
  Administered 2024-04-28: 2 mg via INTRAVENOUS

## 2024-04-28 MED ORDER — FOLIC ACID 1 MG PO TABS
1.0000 mg | ORAL_TABLET | Freq: Every day | ORAL | Status: DC
  Administered 2024-04-28 – 2024-05-23 (×25): 1 mg
  Filled 2024-04-28 (×25): qty 1

## 2024-04-28 MED ORDER — SODIUM CHLORIDE 0.9 % IV SOLN
250.0000 mL | INTRAVENOUS | Status: AC
  Administered 2024-04-28: 250 mL via INTRAVENOUS

## 2024-04-28 MED ORDER — NALOXONE HCL 0.4 MG/ML IJ SOLN
INTRAMUSCULAR | Status: AC
  Administered 2024-04-28: 0.4 mg via INTRAVENOUS
  Filled 2024-04-28: qty 1

## 2024-04-28 MED ORDER — ORAL CARE MOUTH RINSE: 15.0000 mL | OROMUCOSAL | Status: DC | PRN

## 2024-04-28 NOTE — Plan of Care (Signed)
  Problem: Clinical Measurements: Goal: Will remain free from infection Outcome: Progressing Goal: Diagnostic test results will improve Outcome: Progressing Goal: Cardiovascular complication will be avoided Outcome: Progressing   Problem: Nutrition: Goal: Adequate nutrition will be maintained Outcome: Progressing   Problem: Coping: Goal: Level of anxiety will decrease Outcome: Progressing    Problem: Clinical Measurements: Goal: Ability to maintain clinical measurements within normal limits will improve Outcome: Not Progressing Goal: Respiratory complications will improve Outcome: Not Progressing   Problem: Activity: Goal: Risk for activity intolerance will decrease Outcome: Not Progressing   Problem: Elimination: Goal: Will not experience complications related to urinary retention Outcome: Not Progressing

## 2024-04-28 NOTE — Progress Notes (Signed)
 Brief Nutrition Note  Consult received for enteral/tube feeding initiation and management.  Adult Enteral Nutrition Protocol initiated. Full assessment to follow.  OGT tube in place with tip located in LUQ, below the diaphgragm per xray imaging though appears similar to prior imaging. Reached out to NP to ensure safe positioning.   Admitting Dx: Dehydration [E86.0] AKI (acute kidney injury) [N17.9] Sepsis (HCC) [A41.9] Altered mental status, unspecified altered mental status type [R41.82] Sepsis, due to unspecified organism, unspecified whether acute organ dysfunction present (HCC) [A41.9] Severe sepsis (HCC) [A41.9, R65.20]  Body mass index is 20.78 kg/m.  Labs:  Recent Labs  Lab 04/24/24 1113 04/25/24 0356 04/26/24 0518 04/27/24 0345 04/28/24 0845  NA 142 141 143 146* 144  K 4.1 4.3 3.9 3.5 4.2  CL 108 110 108 108 109  CO2 23 20* 23 21* 21*  BUN 66* 65* 62* 59* 67*  CREATININE 1.65* 1.58* 1.66* 1.72* 2.20*  CALCIUM 7.8* 8.2* 8.1* 8.2* 8.1*  MG 1.8  --   --   --   --   PHOS 4.5 4.9* 5.6*  --   --   GLUCOSE 128* 79 104* 98 113*    [name] [name], RDN, LDN Clinical Nutrition See AMiON for contact information.

## 2024-04-28 NOTE — Progress Notes (Signed)
 This RN did shift assessment on patient and observed them having labored breathing, 80-88% SpO2 on 6L of oxygen via [place]. Patient sounds congested with course crackles in BL lungs. Patient is minimally responsive to voice and does not answer orientation questions. Patient is diaphoretic.   This RN performed oral suction with yankeur with little output.Then performed oral deep suction with small clear secretion output. Patient is unable to effectively cough or use incentive spirometer at this moment. Patient satted 90-94% for several minutes, and then dropped to 78-85% SpO2.   Charge nurse was notified and rapid response was called. This RN applied a non-rebreather but patient sustained 80% SpO2. Rapid response arrived, rectal temp done with no abnormality and blood sugar obtained (145). Trenda, MD notified and arrived at bedside. This RN administered narcan x2 with no acute change. Patient continues to sustain 70-80% SpO2 with non-rebreather and HFNC. See new orders with transfer to ICU.

## 2024-04-28 NOTE — Consult Note (Signed)
 Reason for Consult: Difficult Foley / Catheter Trauma  Referring Physician: Trenda Mar MD  [name] [name] is an 48 y.o. male.   HPI:   1 - Difficult Foley / Catheter Trauma - 48yo M admitted with recurrent MSSA bacteremai / osteomyelitis with NSG inability to place catheter and blood at meatus 11/16. Had catheter in correct position by CT 11/4 with no prostate enlargement (25gm). Ascites noted on CT as well. 26F council placed over wire as per below for bulbar urethral false pass / injury.   2 - Chronic Renal Insufficiency - Cr 1.6's at baseline. NO hydro on imaging x several 2025.    PMH sig for IV drug abuse / recurrent admissions for MSSA.   Today [name] [name] is seen for emergent consult for above.    Past Medical History:  Diagnosis Date   Anxiety    Back pain    Fracture of rib of right side 01/26/2015   Hypertension     Past Surgical History:  Procedure Laterality Date   TEE WITHOUT CARDIOVERSION N/A 01/21/2014   Procedure: TRANSESOPHAGEAL ECHOCARDIOGRAM (TEE) with propofol ;  Surgeon: Dorn JULIANNA Ross, MD;  Location: [facility];  Service: Endoscopy;  Laterality: N/A;    No family history on file.  Social History:  reports that he has been smoking cigarettes. He has never used smokeless tobacco. He reports current drug use. Drug: Heroin. He reports that he does not drink alcohol.  Allergies: No Known Allergies  Medications: I have reviewed the patient's current medications.  Results for orders placed or performed during the hospital encounter of 04/14/24 (from the past 48 hours)  Triglycerides     Status: None   Collection Time: 04/26/24  5:18 AM  Result Value Ref Range   Triglycerides 91 <150 mg/dL    Comment: Performed at [facility], [address] [place], [place] [address]  Renal function panel     Status: Abnormal   Collection Time: 04/26/24  5:18 AM  Result Value Ref Range   Sodium 143 135 - 145 mmol/L   Potassium 3.9 3.5 - 5.1 mmol/L   Chloride 108  98 - 111 mmol/L   CO2 23 22 - 32 mmol/L   Glucose, Bld 104 (H) 70 - 99 mg/dL    Comment: Glucose reference range applies only to samples taken after fasting for at least 8 hours.   BUN 62 (H) 6 - 20 mg/dL   Creatinine, Ser 8.33 (H) 0.61 - 1.24 mg/dL   Calcium 8.1 (L) 8.9 - 10.3 mg/dL   Phosphorus 5.6 (H) 2.5 - 4.6 mg/dL   Albumin 2.3 (L) 3.5 - 5.0 g/dL   GFR, Estimated 51 (L) >60 mL/min    Comment: (NOTE) Calculated using the CKD-EPI Creatinine Equation (2021)    Anion gap 12 5 - 15    Comment: Performed at [facility], [address], [place], [place] [address]  Glucose, capillary     Status: None   Collection Time: 04/26/24  7:43 AM  Result Value Ref Range   Glucose-Capillary 96 70 - 99 mg/dL    Comment: Glucose reference range applies only to samples taken after fasting for at least 8 hours.  CBC     Status: Abnormal   Collection Time: 04/27/24  3:45 AM  Result Value Ref Range   WBC 10.7 (H) 4.0 - 10.5 K/uL   RBC 2.63 (L) 4.22 - 5.81 MIL/uL   Hemoglobin 7.8 (L) 13.0 - 17.0 g/dL   HCT 74.6 (  L) 39.0 - 52.0 %   MCV 96.2 80.0 - 100.0 fL   MCH 29.7 26.0 - 34.0 pg   MCHC 30.8 30.0 - 36.0 g/dL   RDW 82.9 (H) 88.4 - 84.4 %   Platelets 202 150 - 400 K/uL   nRBC 0.0 0.0 - 0.2 %    Comment: Performed at [facility], [address] [place], [place] [address]  Comprehensive metabolic panel     Status: Abnormal   Collection Time: 04/27/24  3:45 AM  Result Value Ref Range   Sodium 146 (H) 135 - 145 mmol/L   Potassium 3.5 3.5 - 5.1 mmol/L   Chloride 108 98 - 111 mmol/L   CO2 21 (L) 22 - 32 mmol/L   Glucose, Bld 98 70 - 99 mg/dL    Comment: Glucose reference range applies only to samples taken after fasting for at least 8 hours.   BUN 59 (H) 6 - 20 mg/dL   Creatinine, Ser 8.27 (H) 0.61 - 1.24 mg/dL   Calcium 8.2 (L) 8.9 - 10.3 mg/dL   Total Protein 6.4 (L) 6.5 - 8.1 g/dL   Albumin 2.0 (L) 3.5 - 5.0 g/dL   AST 14 (L) 15 - 41 U/L   ALT <5 0 - 44 U/L   Alkaline  Phosphatase 109 38 - 126 U/L   Total Bilirubin 1.5 (H) 0.0 - 1.2 mg/dL   GFR, Estimated 49 (L) >60 mL/min    Comment: (NOTE) Calculated using the CKD-EPI Creatinine Equation (2021)    Anion gap 17 (H) 5 - 15    Comment: Performed at [facility], [address] [place], [place] [address]    DG Chest Port 1 View Result Date: 04/26/2024 CLINICAL DATA:  Hypoxia. EXAM: PORTABLE CHEST 1 VIEW COMPARISON:  04/25/2024 FINDINGS: Patchy and nodular airspace disease is seen bilaterally with areas of apparent cystic or cavitary change. The cardio pericardial silhouette is enlarged. No acute bony abnormality. Telemetry leads overlie the chest. IMPRESSION: Patchy and nodular airspace disease bilaterally with areas of apparent cystic or cavitary change. No substantial interval change. Electronically Signed   By: Camellia Candle M.D.   On: 04/26/2024 07:28    Review of Systems  Unable to perform ROS: Patient unresponsive   Blood pressure 113/75, pulse (!) 112, temperature 98.8 F (37.1 C), temperature source Oral, resp. rate 10, height 6' (1.829 m), weight 74 kg, SpO2 97%. Physical Exam Vitals reviewed.  Constitutional:      Comments: AOx0 despite no pain meds x 9 hours per NSG. Opens eyes to stimulation only. Symmetric face.   Eyes:     Pupils: Pupils are equal, round, and reactive to light.  Cardiovascular:     Rate and Rhythm: Normal rate.  Pulmonary:     Effort: Pulmonary effort is normal.  Abdominal:     General: Abdomen is flat.  Genitourinary:    Comments: Circ'd, some scant blood at meatus. No overt abdominal / pelvic distension.  Skin:    General: Skin is warm.     BEDSIDE CYSTO AND COMPLICATED CATHETER PLACEMENT:  Using aseptic technique, single attempt made at passage of 25F coude. Some resistance bulbar area and no urine return. 31F flexible cysto then performed. One spot urethral trauma pendulous urethra (50% circumference), another bulbar urethral (posterior 50%  circumference), true lumen at 12 o clock ,prostate non-obstructing, navigated to capacious bladder. [id number] sensor wire placed over which 25F council catheter easily placed, 10mL water in balloon with immediate efflux  non-foul urine. Connected to straing drain. NSG assist very helpful.   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 OK to remove at that time if still in house, otherwise, best served by trial of void at [facility] office.   Please call me directly with questions anytime.   Ricardo KATHEE Alvaro Mickey. 04/28/2024, 2:27 AM

## 2024-04-28 NOTE — Procedures (Signed)
 Central Venous Catheter Insertion Procedure Note  SUSUMU HACKLER  [id number]  [birth year]  Date:04/28/24  Time:10:35 AM   Provider Performing:Pete FORBES Jenna   Procedure: Insertion of Non-tunneled Central Venous Catheter(36556) with US  guidance (23062)   Indication(s) Medication administration, Difficult access, and Hemodialysis  Consent Unable to obtain consent due to emergent nature of procedure.  Anesthesia Topical only with 1% lidocaine    Timeout Verified patient identification, verified procedure, site/side was marked, verified correct patient position, special equipment/implants available, medications/allergies/relevant history reviewed, required imaging and test results available.  Sterile Technique Maximal sterile technique including full sterile barrier drape, hand hygiene, sterile gown, sterile gloves, mask, hair covering, sterile ultrasound probe cover (if used).  Procedure Description Area of catheter insertion was cleaned with chlorhexidine and draped in sterile fashion.  With real-time ultrasound guidance a central venous catheter was placed into the right subclavian vein. Nonpulsatile blood flow and easy flushing noted in all ports.  The catheter was sutured in place and sterile dressing applied.  Complications/Tolerance None; patient tolerated the procedure well. Chest X-ray is ordered to verify placement for internal jugular or subclavian cannulation.   Chest x-ray is not ordered for femoral cannulation.  EBL Minimal  Specimen(s) None

## 2024-04-28 NOTE — Progress Notes (Signed)
 [facility] notified to assist in obtaining the materials for coude foley placement.

## 2024-04-28 NOTE — Progress Notes (Addendum)
 PROGRESS NOTE   [name] [name]  FMW:[id number]    DOB: 01/26/1976    DOA: 04/14/2024  PCP: Bertell Satterfield, MD   I have briefly reviewed patients previous medical records in [facility].   Brief Hospital Course:  48 year old male with history of drug use in the past, prior MSSA pneumonia and MSSA bacteremia, lumbar osteomyelitis admitted 10/2-10/13 2025 (left AMA) who came back to the hospital on 11/2 with altered mental status, covered in feces, poor airway protection and had to be intubated. He was transferred to [facility] and admitted to the ICU. He was found to have recurrent MSSA bacteremia, tricuspid valve endocarditis, multi spine level discitis/osteomyelitis/abscesses, septic pulmonary emboli, MSSA pneumonia with intractable pain.  Neurosurgery and ID was consulted.  Treating nonsurgically.  Course complicated on 11/13 with tachypnea, tachycardia and dyspnea.  CCM had transferred to [facility] and signed off on 11/11, reconsulted them on 11/13.  Diuresing with IV Lasix.  11/15: MD Messaged by patient's RN that patient was less responsive and more hypoxic with increased O2 requirement.  Rapid response had been activated.  Promptly consulted PCCM who were already on patient's case.  Shortly thereafter arrived to patient's bedside and evaluated along with PCCM team.  Patient extremely somnolent, barely arousable, briefly opens eyes and then drifts back to sleep.  Shallow breathing.  Sinus tachycardia in the 110s on telemetry at bedside.  Despite IV naloxone x 2 doses, no significant improvement.  VBG showed pH of 7.2 and pCO2 in the 70s.  Ordered and reviewed chest x-ray with findings as noted below.  Acute respiratory failure with hypoxia and hypercarbia-multifactorial due to MSSA pneumonia/HCAP/septic pulmonary emboli, polypharmacy pain medications and sedatives, difficult pulmonary toilet/weak cough from deconditioning and has a cervical collar complicating the issue.  Acute metabolic  encephalopathy due to hypercarbia and multifactorial.  PCCM transferred patient to ICU and patient was intubated.  Appreciate PCCM help.  Also overnight, had urinary retention, difficult catheterization, urology consulted and placed Foley catheter.  Note that patient now has AKI with creatinine jumping up from 1.7-2.20 secondary to aggressive IV diuresis and may be the urinary retention.  For the rest of hospital course and details, refer to note below from 11/15.  [facility] will sign off at this time.  Kindly consult [facility] again for further assistance.   Assessment & Plan:   Severe sepsis, POA Due to MSSA bacteremia with tricuspid valve endocarditis Complicated by discitis/osteomyelitis at the lumbar spine level, recent psoas abscess, septic pulmonary emboli/MSSA pneumonia Intractable back pain Blood cultures 11/2: MSSA bacteremia.  Imaging this hospitalization showed septic pulmonary emboli, an MRI done during prior hospitalization on 10/3 showed discitis/osteomyelitis L3-L4, L5-S1 with a septic right facet joint at L5-S1.  There was also paraspinal inflammation with small bilateral psoas abscesses - 2D echo: tricuspid valve mobile masses.  CTS recommended medical management and no role for angio vac. 11/11, MRI C/T/L-spine: Refer to detailed reports for multiple acute and chronic problems.  Importantly, septic arthritis involving the atlantodental articulation at the skull base and a superimposed 2.1 cm intra-articular fluid collection, acute OM/discitis T5-6, OM/discitis at L3-4 and L5-S1 mildly progressed from 10/3, paraspinous inflammatory changes with superimposed left psoas abscess, septic arthritis L5-S1 facet progressed from prior, associated 2 cm soft tissue abscess within the adjacent posterior paraspinal soft tissue, septic arthritis L3-4 facet with 1.1 cm soft tissue abscess and adjacent posterior paraspinal soft tissue Neurosurgery see me 11/12: They recommend, L-spine: Nonoperative treatment  with antibiotics, LSO brace for  comfort if needed for back pain when upright; C-spine: Currently not a surgical candidate but may need surgery in the future, recommend [place] J collar.  Follow-up with Dr. Darnella in 6 weeks. Multimodality pain control.  Adjusted pain meds.  Added lidocaine  patch, gabapentin 100 mg twice daily and changed Robaxin to scheduled 5 mg 3 times daily.  Pain appears to be somewhat better controlled. ID following for antimicrobial recommendations.  Was on Ancef  but broadened to IV Zosyn to cover for HCAP.  MRSA PCR negative, linezolid  could be stopped.   Acute hypoxic respiratory failure -intubated initially, extubated 11/9.   11/13, clinically worsened.  PCCM consulted.  Lower extremity venous Dopplers negative for DVT.  Diuresing aggressively with IV Lasix with improvement.  Serum sodium has increased to 146.  Discussed with Dr. Toribio Sharps, PCCM, recommends reducing IV Lasix from 60 mg 3 times daily to 60 mg twice daily and give a dose of metolazone 2.5 mg.   Acute metabolic encephalopathy AMS had resolved.  However remains at risk for worsening due to polypharmacy.  Judicious use of opioids and sedatives.  Cut back on Xanax  from 1 mg 3 times daily to 0.75 mg 3 times daily.  As per home med rec review, no records of where he was getting these medications.   History of substance abuse Per prior chart review, clean for several months, has not done any drugs, not drinking, but smoking cigarettes still.   Acute kidney injury  -likely in the setting of severe sepsis, creatinine on admission at 4.0, creatinine slowly trending down as noted below.  Had a degree of ATN from his sepsis picture, will continue to monitor as he may have a degree of chronic kidney disease moving forward.  Creatinine is bumped up from 1.58-1.66-1.7 post diuresis, monitor closely while on IV Lasix.   Hyperkalemia -resolved   Hypernatremia Serum sodium up to 146 today.  Giving a dose of metolazone  along with IV Lasix.  Follow BMP in AM.   Elevated LFTs -due to sepsis, shock liver, no intra-abdominal abnormality on CT.  Improved.   Anemia Thrombocytopenia, resolved Hemoglobin is dropped from 9-10 g range to 7.8 in the absence of overt bleeding.?  Accuracy.?  Related to acute infection and illness.  Follow CBC in AM.  Transfuse for hemoglobin 7 g or less.   History of hypertension Intermittent mild hypertension.  Added as needed IV hydralazine.   History of anxiety Continue alprazolam , reduced dose as noted above.   Stage 2 pressure ulcer sacrum, present on admission  -Continue wound care.  Appreciate consultation  Sinus tachycardia Multifactorial due to pain, infection excetra.  Stable  Body mass index is 20.78 kg/m.   DVT prophylaxis: Place TED hose Start: 04/26/24 1238 heparin  injection 5,000 Units Start: 04/20/24 1400 SCDs Start: 04/14/24 2038     Code Status: Full Code:  Family Communication: Spouse via phone. Updated care and advised that he is critically ill.  Disposition:  Status is: Inpatient Remains inpatient appropriate because: IV Abx.  Remains quite ill.     Consultants:   Cardiothoracic surgery Infectious disease  Procedures:   As above  Subjective:  Denies dyspnea.  States that his breathing is excellent.  Pain during nursing care and position change, advised that his expected and he stated that he was going to take it a day at a time.  Reassured.  No other complaints reported.  Objective:   Vitals:   04/28/24 1100 04/28/24 1105 04/28/24 1110 04/28/24 1115  BP: (!) 76/52 (!) 81/63 (!) 87/62 (!) 87/65  Pulse: (!) 102 100 100 97  Resp: 17 (!) 24 12 (!) 5  Temp:      TempSrc:      SpO2: 100% 100% 100% 100%  Weight:      Height:        General exam: Young male, moderately built and frail, chronically ill looking looks improved compared to 2 days ago did not appear in any distress. Neck: Has Aspen neck collar Respiratory system: Slightly  harsh breath sounds but no wheezing, rhonchi or crackles.  No increased work of breathing.  No tachypnea. Cardiovascular system: S1 & S2 heard, RRR. No JVD, murmurs, rubs, gallops or clicks. No pedal edema.  Telemetry personally reviewed: ST 100-120's Gastrointestinal system: Abdomen is nondistended, soft and nontender. No organomegaly or masses felt. Normal bowel sounds heard. Central nervous system: Alert and oriented. No focal neurological deficits. Extremities: Quadriparetic with grade at least 3 x 5 power in all limbs and 4 x 5 power in left upper extremity. Skin: No rashes, lesions or ulcers Psychiatry: Judgement and insight appear normal. Mood & affect appropriate.     Data Reviewed:   I have personally reviewed following labs and imaging studies   CBC: Recent Labs  Lab 04/25/24 0356 04/27/24 0345 04/28/24 0845  WBC 15.2* 10.7* 13.2*  HGB 10.2* 7.8* 8.5*  HCT 34.6* 25.3* 29.7*  MCV 98.6 96.2 102.8*  PLT 245 202 152    Basic Metabolic Panel: Recent Labs  Lab 04/22/24 0627 04/23/24 0509 04/24/24 1113 04/25/24 0356 04/26/24 0518 04/27/24 0345 04/28/24 0845  NA 144 145 142 141 143 146* 144  K 3.6 4.6 4.1 4.3 3.9 3.5 4.2  CL 109 111 108 110 108 108 109  CO2 24 19* 23 20* 23 21* 21*  GLUCOSE 105* 87 128* 79 104* 98 113*  BUN 73* 71* 66* 65* 62* 59* 67*  CREATININE 1.45* 1.62* 1.65* 1.58* 1.66* 1.72* 2.20*  CALCIUM 7.9* 8.2* 7.8* 8.2* 8.1* 8.2* 8.1*  MG  --   --  1.8  --   --   --   --   PHOS 3.9 4.2 4.5 4.9* 5.6*  --   --     Liver Function Tests: Recent Labs  Lab 04/23/24 0509 04/24/24 1113 04/25/24 0356 04/26/24 0518 04/27/24 0345  AST  --  22  --   --  14*  ALT  --  <5  --   --  <5  ALKPHOS  --  136*  --   --  109  BILITOT  --  1.1  --   --  1.5*  PROT  --  6.7  --   --  6.4*  ALBUMIN 1.8* 1.5* 1.8* 2.3* 2.0*    CBG: Recent Labs  Lab 04/26/24 0743 04/28/24 0800 04/28/24 0952  GLUCAP 96 125* 121*    Microbiology Studies:   No results  found for this or any previous visit (from the past 240 hours).   Radiology Studies:  DG CHEST PORT 1 VIEW Result Date: 04/28/2024 CLINICAL DATA:  Endotracheal tube evaluation and central line placement. EXAM: PORTABLE CHEST 1 VIEW COMPARISON:  04/28/2024 at 8:40 a.m. FINDINGS: Endotracheal tube with tip 6.1 cm above the carina. Nasogastric tube courses into the region of the stomach and off the image as tip is not visualized. Nasogastric tube side port projects over the gastroesophageal junction. Right-sided PICC line has tip over the SVC. Lungs are adequately inflated demonstrate persistent bilateral  patchy airspace process without significant change. Suggestion of small amount of bilateral pleural fluid slightly worse. Cardiomediastinal silhouette and remainder of the exam is unchanged. IMPRESSION: 1. Persistent bilateral patchy airspace process without significant change. Suggestion of small amount of bilateral pleural fluid slightly worse. 2. Tubes and lines as described. Nasogastric tube side port projects over the gastroesophageal junction. Electronically Signed   By: Toribio Agreste M.D.   On: 04/28/2024 11:20   DG Abd 1 View Result Date: 04/28/2024 CLINICAL DATA:  Nasogastric tube placement. EXAM: ABDOMEN - 1 VIEW COMPARISON:  KUB 04/19/2024, chest x-ray 04/26/2024 FINDINGS: Nasogastric tube is present with tip over the stomach in the left upper quadrant and side-port in the region of the gastroesophageal junction. This could be advanced approximately 5-6 cm. Several air-filled mildly dilated centralized small bowel loops are present. No free peritoneal air. Bilateral pulmonary patchy airspace process without significant change. Suggestion of mild bilateral pleural fluid. IMPRESSION: 1. Nasogastric tube with tip over the stomach in the left upper quadrant and side-port in the region of the gastroesophageal junction. This could be advanced approximately 5-6 cm. 2. Several air-filled mildly dilated  centralized small bowel loops which may be due to ileus versus partial small bowel obstruction. 3. Persistent bilateral patchy airspace process with suggestion of mild bilateral pleural fluid. Electronically Signed   By: Toribio Agreste M.D.   On: 04/28/2024 11:17   DG CHEST PORT 1 VIEW Result Date: 04/28/2024 CLINICAL DATA:  Hypoxia. EXAM: PORTABLE CHEST 1 VIEW COMPARISON:  04/26/2024 FINDINGS: Lungs are adequately inflated demonstrate persistent bilateral patchy airspace process left worse than right without significant change. Possible small amount of bilateral pleural fluid. Cardiomediastinal silhouette and remainder of the exam is unchanged. IMPRESSION: Persistent bilateral patchy airspace process left worse than right without significant change. Possible small amount of bilateral pleural fluid. Electronically Signed   By: Toribio Agreste M.D.   On: 04/28/2024 09:21    Scheduled Meds:    ALPRAZolam   0.75 mg Per Tube Q8H   Chlorhexidine Gluconate Cloth  6 each Topical Daily   folic acid   1 mg Per Tube Daily   free water  200 mL Per Tube Q6H   gabapentin  100 mg Per Tube Q12H   guaiFENesin   5 mL Per Tube Q4H   heparin  injection (subcutaneous)  5,000 Units Subcutaneous Q8H   lidocaine   1 patch Transdermal Q24H   [START ON 04/29/2024] multivitamin with minerals  1 tablet Per Tube Daily   oxyCODONE   5 mg Per Tube Q6H    Continuous Infusions:    sodium chloride  Stopped (04/28/24 1005)   dexmedetomidine (PRECEDEX) IV infusion 0.4 mcg/kg/hr (04/28/24 1011)   norepinephrine (LEVOPHED) Adult infusion 10 mcg/min (04/28/24 1130)   piperacillin-tazobactam (ZOSYN)  IV 3.375 g (04/28/24 0830)   vasopressin 0.04 Units/min (04/28/24 1137)     LOS: 14 days   Critical care time spent in coordinating care: 60 minutes.  Trenda Mar, MD,  FACP, [facility], [facility], [facility]   [facility] [place]      To contact the attending provider between 7A-7P or the covering  provider during after hours 7P-7A, please log into the web site [email or web address] and access using universal [place] password for that web site. If you do not have the password, please call the hospital operator.  04/28/2024, 12:01 PM

## 2024-04-28 NOTE — Progress Notes (Signed)
 RT helped transport pt from 4NP12 to 62M14 on HFNC/NRB then placed on BIPAP upon arrival to 62M. No complications during transport.

## 2024-04-28 NOTE — Progress Notes (Signed)
 Wife updated via phone

## 2024-04-28 NOTE — Progress Notes (Signed)
 Date and time results received: 04/28/24 0907  Test: ABG CO2  Critical Value: 11  Name of Provider Notified: Judeth Lulas, MD  New order: Transfer patient to medical ICU.

## 2024-04-28 NOTE — Progress Notes (Signed)
 Blood noted at meatus prior to insertion attempt. Met resistance and catheter coiled when attempted to place coude foley. Made Mindy, patient's nurse, aware of unsuccessful attempt. Morrie Daywalt A

## 2024-04-28 NOTE — Progress Notes (Addendum)
 NAME:  [name] [name], MRN:  [id number], DOB:  [birth year], LOS: 14 ADMISSION DATE:  04/14/2024, CONSULTATION DATE:  04/14/2024 REFERRING MD:  Suzette - [facility] EDP, CHIEF COMPLAINT:  AMS  History of Present Illness:  48 year old man who presented to [facility] ED 11/2 with AMS. PMHx significant for MSSA bacteremia, MSSA PNA, infective endocarditis, cervical through lumbar osteomyelitis, psoas abscess, severe protein calorie malnutrition/FTT, substance abuse. Recent admission 10/2 - 10/13 for sepsis, widespread MSSA.  Arrived to [facility] ED covered in feces, altered and with poor airway protection; he was subsequently intubated. CXR demonstrated RLL opacities. Confirmed bilateral septic pulmonary emboli, tricuspid valve endocarditis.  Required intubation and mechanical ventilation. Clinically progressed and was transferred out of ICU 11/10.  Reconsulted 11/13 for AMS, tachypnea and tachycardia.   Pertinent Medical History:  MSSA bacteremia MSSA PNA Lumbar osteomyelitis  Psoas abscess  Severe protein calorie malnutrition FTT in adult  Significant Hospital Events: Including procedures, antibiotic start and stop dates in addition to other pertinent events   11/2 [facility] ED w AMS. Intubated. Started on vanc mero. Txf request to [place] to [facility]  11/3 extubated, developed respiratory distress requiring reintubation. Brief cardiac arrest during intubation.  11/3 transthoracic echocardiogram > LVEF 40-45%, global hypokinesis, grade 1 diastolic dysfunction, normal RV function.  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  No mitral or aortic valve involvement 11/3 CT chest, abdomen, pelvis >> numerous bilateral cavitary and solid nodules consistent with septic emboli, left lower lobe consolidation with a small pleural effusion, right basilar atelectasis.  Moderate free fluid in the pelvis.  Erosive changes at T5-T6, L2-L3, L4-L5 suggestive of discitis 11/3 CT head >> no acute abnormality.   Opacified left maxillary sinus 11/10 - Transferred out of ICU. 11/13- PCCM reconsulted for decreased mental status, tachycardia, tachypnea. Started diuretics  Interim History/Subjective:  Febrile overnight. Still feels SOB.   Objective:   Blood pressure 95/73, pulse (!) 108, temperature 98.2 F (36.8 C), temperature source Rectal, resp. rate 16, height 6' (1.829 m), weight 74 kg, SpO2 97%.        Intake/Output Summary (Last 24 hours) at 04/28/2024 0859 Last data filed at 04/28/2024 0600 Gross per 24 hour  Intake 223.99 ml  Output 1800 ml  Net -1576.01 ml   Filed Weights   04/25/24 0500 04/26/24 0500 04/27/24 0500  Weight: 76 kg 76.2 kg 74 kg   Physical Examination: General this is a chronically ill 48 yom who is bradycinetic and slow to respond on initial eval. He did seem to improve some post narcan briefly.  HENT  MM dry. Currently in C collar. Pupils are pinpoint  Pulm coarse rhonchus breath sounds t/o. Cough is rhonchus. Currently on 5 LPM. Sats down to 80% will go down to 70s intermittently. POCUS: bilateral evaluated. No pleural effusion  Card RRR no MRG Ext warm no edema Neuro will open eyes, + gross motor uppers. Attempts to communicate but speech to rhonchus to understand. Nods semi-appropriately. Perhaps minimal improvement after narcan.  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 Last MRSA PCR was negative on 11/2; ID stopped zyvox  11/16.  Plan Cont  IV zosyn (length of therapy still TBD) Awaiting MRSA repeat PCR. Low threshold to  resume zyvox   Acute hypoxic and hypercarbic respiratory failure 2/2 MSSA PNA, ARDS, mucous plugging, and septic pulmonary emboli.  I also think that deconditioning and generalized weakness are the contributing factors along w/ ineffective airway clearance  -not candidate for NIPPV -POCUS no evidence of effusion  Plan Intubate Repeat resp culture  VAP bundle  PAD protocol  RASS goal -2 F/u abg  Will likely need trach  Not clear to what degree long term vent needs will be  Post-intubation hypotension. Suspect this represents underlying volume depletion following diuretics Plan 1 liter LR Stop lasix  Start Norepi MAP goal > 65 Send cortisol   Acute Metabolic Encephalopathy 2/2 hypercarbia superimposed difficult to control pain and h/o substance abuse and chronic anxiety  -minimal response to narcan  -last narcs and benzos last evening around 10p Plan Supportive care Treat infection  PAD protocol RASS goal -1 will start dex w/ PRN fent  Cont low dose scheduled benzo and oxy   Acute renal failure further c/b urinary retention req'd Urology placement not foley 11/16 Had been improving scr had bumped some. Suspect partially worsened by retention Plan Keep foley in place  Strict I&O Renal dose meds F/u chem  Insure MAP > 65  Hypernatremia A little worse w/ diuretics Plan Free water via tube   Elevated Alk phos  Improving Plan  Monitor   Anemia No evidence of acute bleeding Plan Trend cbc Trigger for transfusion < 7   Protein calorie malnutrition  Plan Place FT  Start tubefeeds  Stage 2 pressure ulcer sacrum, present on admission Plan Cont wound care   H/o HTN  Plan Holding antihypertensives    Critical care time: N/A   I personally  spent 60 minutes  on this patient which included: review of medical records, nursing notes, progress notes, evaluation, interpretation of lab data and diagnostic studies, taking independent history, performing exam,  documenting plan, ordering diagnostics and interventions for the following critical care issues: Circulatory shock, Acute respiratory failure, Acute toxic and or metabolic encephalopathy with the following interventions which included: titration of ventilatory support, titration of hemodynamic drips to desired MAP, evaluation of life threatening infection and determining appropriate pharmaceutical intervention , evaluation and management of acute neurological insult , prevention of further deterioration

## 2024-04-28 NOTE — Procedures (Signed)
 Intubation Procedure Note  [name] [name]  [id number]  May 15, 1976  Date:04/28/24  Time:10:06 AM   Provider Performing:Pete E Jenna    Procedure: Intubation (31500)  Indication(s) Respiratory Failure  Consent Unable to obtain consent due to emergent nature of procedure.   Anesthesia Etomidate and Versed    Time Out Verified patient identification, verified procedure, site/side was marked, verified correct patient position, special equipment/implants available, medications/allergies/relevant history reviewed, required imaging and test results available.   Sterile Technique Usual hand hygeine, masks, and gloves were used   Procedure Description Patient positioned in bed supine.  Sedation given as noted above.  Patient was intubated with endotracheal tube using Glidescope.  View was Grade 1 full glottis .  Number of attempts was 1.  Colorimetric CO2 detector was consistent with tracheal placement.   Complications/Tolerance None; patient tolerated the procedure well. Chest X-ray is ordered to verify placement.  The pt was pre-oxygenated w/ NIPPV Elected to do non-paralyzed intubation w/ C-collar and limited neck mobility  Placed in [facility] elevated position C collar maintained w/ nursing further assisting w/ head/neck immobilization    EBL Minimal   Specimen(s) None

## 2024-04-28 NOTE — Progress Notes (Addendum)
 In/out cath unsuccessful.  Urology on call consulted for further intervention.  Urology cart at bedside awaiting urologist.  Will continue to monitor patient.

## 2024-04-28 NOTE — Significant Event (Addendum)
 Rapid Response Event Note   Reason for Call :  Lethargy  Initial Focused Assessment:  Pt in bed. Opens eyes to voice. PERRLA, 2mm. Moans, grimaces. Does not answer orientation questions. Purposeful movement. Skin warm, moist. Rhonchus breath sounds throughout. Upper airway congestion. Weak cough. Patient in C-collar.   VS: T 98.58F (rectal), BP 95/73, HR 112, RR 22, SpO2 97% on 5LNC CBG: 125  Interventions:  Per MD: PCCM consult, ABG, CXR ABG: 7.2/ 76/ 119/ 30.1  Plan of Care:  -Returned to bedside at 0900-0930:  Patient received Narcan 0.4mg  x2 with no improvement. Placed on 15L HFNC and 100% NRB due to acute oxygen desaturation, SpO2 80%. Attempted to NTS patient, however was unable to pass catheter. RT to bedside and assisted transfer to ICU.  -Transfer to ICU  Event Summary:  MD Notified: Dr. Judeth, per primary RN Call Time: 0745 Arrival Time: 0750 End Time: 9189   Leonor LITTIE Danker, RN, HR

## 2024-04-28 NOTE — Progress Notes (Signed)
 Obtained orders for coude foley catheter insertion.  Notified designated person to insert catheter and materials management notified to send supplies.

## 2024-04-29 ENCOUNTER — Inpatient Hospital Stay (HOSPITAL_COMMUNITY): Payer: MEDICAID

## 2024-04-29 DIAGNOSIS — Z515 Encounter for palliative care: Secondary | ICD-10-CM

## 2024-04-29 LAB — BASIC METABOLIC PANEL WITH GFR
Anion gap: 13 (ref 5–15)
BUN: 77 mg/dL — ABNORMAL HIGH (ref 6–20)
CO2: 24 mmol/L (ref 22–32)
Calcium: 7.8 mg/dL — ABNORMAL LOW (ref 8.9–10.3)
Chloride: 103 mmol/L (ref 98–111)
Creatinine, Ser: 2.56 mg/dL — ABNORMAL HIGH (ref 0.61–1.24)
GFR, Estimated: 30 mL/min — ABNORMAL LOW (ref >60)
Glucose, Bld: 131 mg/dL — ABNORMAL HIGH (ref 70–99)
Potassium: 3.9 mmol/L (ref 3.5–5.1)
Sodium: 140 mmol/L (ref 135–145)

## 2024-04-29 LAB — GLUCOSE, CAPILLARY
Glucose-Capillary: 76 mg/dL (ref 70–99)
Glucose-Capillary: 103 mg/dL — ABNORMAL HIGH (ref 70–99)
Glucose-Capillary: 82 mg/dL (ref 70–99)
Glucose-Capillary: 113 mg/dL — ABNORMAL HIGH (ref 70–99)
Glucose-Capillary: 103 mg/dL — ABNORMAL HIGH (ref 70–99)
Glucose-Capillary: 111 mg/dL — ABNORMAL HIGH (ref 70–99)

## 2024-04-29 LAB — TRIGLYCERIDES: Triglycerides: 169 mg/dL — ABNORMAL HIGH (ref <150)

## 2024-04-29 LAB — CBC
HCT: 26 % — ABNORMAL LOW (ref 39.0–52.0)
Hemoglobin: 7.8 g/dL — ABNORMAL LOW (ref 13.0–17.0)
MCH: 29 pg (ref 26.0–34.0)
MCHC: 30 g/dL (ref 30.0–36.0)
MCV: 96.7 fL (ref 80.0–100.0)
Platelets: 198 K/uL (ref 150–400)
RBC: 2.69 MIL/uL — ABNORMAL LOW (ref 4.22–5.81)
RDW: 17 % — ABNORMAL HIGH (ref 11.5–15.5)
WBC: 15 K/uL — ABNORMAL HIGH (ref 4.0–10.5)
nRBC: 0 % (ref 0.0–0.2)

## 2024-04-29 LAB — PHOSPHORUS: Phosphorus: 7.4 mg/dL — ABNORMAL HIGH (ref 2.5–4.6)

## 2024-04-29 LAB — MAGNESIUM: Magnesium: 1.7 mg/dL (ref 1.7–2.4)

## 2024-04-29 MED ORDER — ALBUMIN HUMAN 5 % IV SOLN
25.0000 g | Freq: Four times a day (QID) | INTRAVENOUS | Status: DC
  Filled 2024-04-29: qty 500

## 2024-04-29 MED ORDER — OSMOLITE 1.5 CAL PO LIQD
1000.0000 mL | ORAL | Status: DC
  Administered 2024-04-29 – 2024-05-16 (×19): 1000 mL
  Filled 2024-04-29 (×14): qty 1000

## 2024-04-29 MED ORDER — ALBUMIN HUMAN 25 % IV SOLN
25.0000 g | Freq: Four times a day (QID) | INTRAVENOUS | Status: AC
  Administered 2024-04-29 – 2024-04-30 (×4): 25 g via INTRAVENOUS
  Filled 2024-04-29 (×4): qty 100

## 2024-04-29 NOTE — Progress Notes (Signed)
 PT Cancellation Note  Patient Details Name: [name] MRN: [id number] DOB: [birth year]   Cancelled Treatment:    Reason Eval/Treat Not Completed: (P) Medical issues which prohibited therapy Pt back on vent and sedated. PT will follow back tomorrow for treatment.   Tyeasha Ebbs B. Fleeta Lapidus PT, DPT Acute Rehabilitation Services Please use secure chat or  Call Office [phone]  [name] 04/29/2024, 2:42 PM

## 2024-04-29 NOTE — Plan of Care (Signed)
 See care plan

## 2024-04-29 NOTE — Consult Note (Signed)
 Consultation Note Date: 04/29/2024   Patient Name: [name] [name]  DOB: Jan 14, 1976  MRN: [id number]  Age / Sex: 48 y.o., male  PCP: Bertell Satterfield, MD Referring Physician: Neda Jennet LABOR, MD  Reason for Consultation:  functional quad, trach/PEG vs. comfort  HPI/Patient Profile: 48 y.o. male  with past medical history of substance abuse, recent hospitalization for sepsis r/t MSSA bacteremia with septic pneumonia and PE which he left AMA without completing recommended antibiotics, lumbar discitis and septic facet joint admitted on 04/14/2024 with sepsis d/t MSSA bacteremia. Admission has been complicated due to findings of mobile vegetation on atrial valve- per CVTS no surgical interventions recommended. He has been intubated 3 times. Had PEA cardiac arrest on 11/3 with 4 mins CPR.  Last intubation was 11/16. He has severe back pain. MRI this admission shows septic arthritis at skill base, T5-T6 osteomyelitis/discitis.  He is currently intubated. Levophed turned off today- 11/17. Sedated with propofol . Palliative medicine consulted for assistance with medical decision making.   Primary Decision Maker NEXT OF KIN- spouse- Warren Benders  Discussion: Extensive chart reviewed including labs, progress notes, imaging from this and previous encounters.  Multiple MRIs reviewed- noting several areas of discitis/osteomyelitis of spine. PCCM not reviewed. CVTS reviewed- no surgical intervention for valve vegetations.  Discussed with bedside RN and PCCM NP Deward.  On eval patient sedated with propofol . Did not wake to my voice. Per RN has been tolerating weaning. Follows commands. Per Deward- due to patient being intubated x3- they are strongly recommended trach/PEG before extubation if he is to continue aggressive interventions.  I called and spoke with Warren- patient's spouse. Introduced Palliative medicine. Palliative  medicine is specialized medical care for people living with serious illness. It focuses on providing relief from the symptoms and stress of a serious illness. The goal is to improve quality of life for both the patient and the family. Warren was emotional at hearing about Palliative referral. I reviewed role of PMT and to provide support during serious illness.  Patient and Warren have one son, who is 46- in foster care. She is attempting to arrange a time for son to visit hospital with foster family.  Patient's mother is in nursing facility and Warren is primary caregiver for her. Warren is concerned about telling patient's mother about his current illness state.  Emotional support provided as Warren shared the stress she feels re: making decisions for patient by herself.  We discussed meeting here in the hospital for more discussion and medical decision making.   SUMMARY OF RECOMMENDATIONS -respiratory failure in the setting of sepsis MSSA bacteremia with pneumonia, septic PE, multiple levels of spinal discitis/ostemyelitis- continue current interventions -Warren was unable to discuss GOC today- she will call me with a time to meet- likely tomorrow or Wednesday     Code Status/Advance Care Planning:   Code Status: Full Code    Prognosis:   Unable to determine  Discharge Planning: To Be Determined  Primary Diagnoses: Present on Admission:  Sepsis (HCC)  Severe sepsis (  HCC)  MSSA bacteremia  Acute respiratory failure with hypoxia and hypercapnia (HCC)   Review of Systems  Unable to perform ROS: Mental status change    Physical Exam Vitals and nursing note reviewed.  Constitutional:      Appearance: He is ill-appearing.  Pulmonary:     Comments: intubated Skin:    General: Skin is warm and dry.  Neurological:     Comments: sedated     Vital Signs: BP 127/89   Pulse (!) 105   Temp 99.9 F (37.7 C) (Axillary)   Resp (!) 25   Ht 6' (1.829 m)   Wt 69.5 kg   SpO2 97%   BMI  20.78 kg/m  Pain Scale: CPOT POSS *See Group Information*: 1-Acceptable,Awake and alert Pain Score: Asleep   SpO2: SpO2: 97 % O2 Device:SpO2: 97 % O2 Flow Rate: .O2 Flow Rate (L/min): 5 L/min  IO: Intake/output summary:  Intake/Output Summary (Last 24 hours) at 04/29/2024 1111 Last data filed at 04/29/2024 1100 Gross per 24 hour  Intake 3464.24 ml  Output 200 ml  Net 3264.24 ml    LBM: Last BM Date : 04/28/24 Baseline Weight: Weight: 62.5 kg Most recent weight: Weight: 69.5 kg       Thank you for this consult. Palliative medicine will continue to follow and assist as needed.  Time Total: 90 minutes Signed by: Cassondra Stain, AGNP-C Palliative Medicine  Time includes:   Preparing to see the patient (e.g., review of tests) Obtaining and/or reviewing separately obtained history Performing a medically necessary appropriate examination and/or evaluation Counseling and educating the patient/family/caregiver Ordering medications, tests, or procedures Referring and communicating with other health care professionals (when not reported separately) Documenting clinical information in the electronic or other health record Independently interpreting results (not reported separately) and communicating results to the patient/family/caregiver Care coordination (not reported separately) Clinical documentation   Please contact Palliative Medicine Team phone at [phone] for questions and concerns.  For individual provider: See Tracey

## 2024-04-29 NOTE — TOC Progression Note (Signed)
 Transition of Care [facility]) - Progression Note    Patient Details  Name: [name] [name] MRN: [id number] Date of Birth: 1976/01/30  [facility]) CM/SW Contact  Inocente GORMAN Kindle, LCSW Phone Number: 04/29/2024, 3:30 PM  Clinical Narrative:    CSW requested Financial Counseling screen patient for Medicaid.                      Expected Discharge Plan and Services                                               Social Drivers of Health (SDOH) Interventions SDOH Screenings   Food Insecurity: No Food Insecurity (03/15/2024)  Housing: Low Risk  (03/15/2024)  Transportation Needs: No Transportation Needs (03/15/2024)  Utilities: Not At Risk (03/15/2024)  Tobacco Use: High Risk (03/14/2024)    Readmission Risk Interventions    04/15/2024    4:16 PM 03/25/2024    9:50 AM 03/22/2024   10:01 AM  Readmission Risk Prevention Plan  Transportation Screening Complete Complete Complete  PCP or Specialist Appt within 5-7 Days Complete    Home Care Screening Complete Complete Complete  Medication Review (RN CM) Referral to Pharmacy Complete Complete

## 2024-04-29 NOTE — TOC Progression Note (Signed)
 Transition of Care [facility]) - Progression Note    Patient Details  Name: [name] [name] MRN: [id number] Date of Birth: 1975/07/21  Transition of Care [facility] CM/SW Contact  Bernardino Dean, [place] Phone Number: 04/29/2024, 9:39 AM  Clinical Narrative:    PT recs changed from CIR to SNF. Patient transferred to ICU, on vent/pressors. TOC following for disposition planning and clinical progress.                      Expected Discharge Plan and Services                                               Social Drivers of Health (SDOH) Interventions SDOH Screenings   Food Insecurity: No Food Insecurity (03/15/2024)  Housing: Low Risk  (03/15/2024)  Transportation Needs: No Transportation Needs (03/15/2024)  Utilities: Not At Risk (03/15/2024)  Tobacco Use: High Risk (03/14/2024)    Readmission Risk Interventions    04/15/2024    4:16 PM 03/25/2024    9:50 AM 03/22/2024   10:01 AM  Readmission Risk Prevention Plan  Transportation Screening Complete Complete Complete  PCP or Specialist Appt within 5-7 Days Complete    Home Care Screening Complete Complete Complete  Medication Review (RN CM) Referral to Pharmacy Complete Complete

## 2024-04-29 NOTE — Progress Notes (Signed)
 NAME:  [name] [name], MRN:  [id number], DOB:  [birth year], LOS: 15 ADMISSION DATE:  04/14/2024, CONSULTATION DATE:  04/14/2024 REFERRING MD:  Suzette - [facility] EDP, CHIEF COMPLAINT:  AMS  History of Present Illness:  48 year old man who presented to [facility] ED 11/2 with AMS. PMHx significant for MSSA bacteremia, MSSA PNA, infective endocarditis, cervical through lumbar osteomyelitis, psoas abscess, severe protein calorie malnutrition/FTT, substance abuse. Recent admission 10/2 - 10/13 for sepsis, widespread MSSA.  Arrived to [facility] ED covered in feces, altered and with poor airway protection; he was subsequently intubated. CXR demonstrated RLL opacities. Confirmed bilateral septic pulmonary emboli, tricuspid valve endocarditis.  Required intubation and mechanical ventilation. Clinically progressed and was transferred out of ICU 11/10.  Reconsulted 11/13 for AMS, tachypnea and tachycardia.   Pertinent Medical History:  MSSA bacteremia MSSA PNA Lumbar osteomyelitis  Psoas abscess  Severe protein calorie malnutrition FTT in adult  Significant Hospital Events: Including procedures, antibiotic start and stop dates in addition to other pertinent events   11/2 [facility] ED w AMS. Intubated. Started on vanc mero. Txf request to [place] to [facility]  11/3 extubated, developed respiratory distress requiring reintubation. Brief cardiac arrest during intubation.  11/3 transthoracic echocardiogram > LVEF 40-45%, global hypokinesis, grade 1 diastolic dysfunction, normal RV function.  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  No mitral or aortic valve involvement 11/3 CT chest, abdomen, pelvis >> numerous bilateral cavitary and solid nodules consistent with septic emboli, left lower lobe consolidation with a small pleural effusion, right basilar atelectasis.  Moderate free fluid in the pelvis.  Erosive changes at T5-T6, L2-L3, L4-L5 suggestive of discitis 11/3 CT head >> no acute abnormality.   Opacified left maxillary sinus 11/10 - Transferred out of ICU. 11/13- PCCM reconsulted for decreased mental status, tachycardia, tachypnea. Started diuretics 11/16 poorly responsive, transferred back to ICU and intubated for the third time. Pressors started post intubation. Renal function worse. UOP poor.   Interim History/Subjective:  Remains on vent, levophed low dose.    Objective:   Blood pressure 101/70, pulse 86, temperature 99.9 F (37.7 C), temperature source Axillary, resp. rate (!) 24, height 6' (1.829 m), weight 69.5 kg, SpO2 100%. CVP:  [2 mmHg-14 mmHg] 8 mmHg  Vent Mode: PRVC FiO2 (%):  [40 %-100 %] 40 % Set Rate:  [15 bmp-24 bmp] 24 bmp Vt Set:  [470 mL] 470 mL PEEP:  [5 cmH20-8 cmH20] 5 cmH20 Plateau Pressure:  [12 cmH20-18 cmH20] 12 cmH20   Intake/Output Summary (Last 24 hours) at 04/29/2024 0809 Last data filed at 04/29/2024 0600 Gross per 24 hour  Intake 4264.98 ml  Output 200 ml  Net 4064.98 ml   Filed Weights   04/26/24 0500 04/27/24 0500 04/28/24 1014  Weight: 76.2 kg 74 kg 69.5 kg   Physical Examination:  General Frail middle aged gentleman on vent HENT  MM dry, C collar  Pulm: course throughout. Significant amount of tan ETT secretions.    Card: regular rate and rhythm Ext: RUE edema, BLE edema Neuro: on propofol , arouses to voice. Moves both uppers. Not following commands.  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 Last MRSA PCR was negative on 11/2; ID stopped zyvox  11/16.  Plan Continue Zosyn for duration TBD ID following MRSA swab negative  Acute hypoxic and hypercarbic respiratory failure 2/2 MSSA PNA, ARDS, mucous plugging, and septic pulmonary emboli.  Deconditioning and generalized weakness are contributing factors  along w/ ineffective airway clearance  -not candidate for NIPPV -POCUS 11/16 no evidence of effusion  Plan Full vent support SBT/SAT Resp culture pending VAP bundle  PAD protocol  -  prop RASS goal -2 PMT consult pending re: trach vs comfort. 3rd intubation now.   Post-intubation hypotension Lasix stopped Gentle volume Norepi for MAP 65 Send cortisol   Acute Metabolic Encephalopathy 2/2 hypercarbia superimposed difficult to control pain and h/o substance abuse and chronic anxiety  -minimal response to narcan  -last narcs and benzos last evening around 10p Plan Supportive care Treat infection  Cont low dose scheduled benzo and oxy  Propofol  infusion for RASS goal 0 to -1.  Impulse is to remove sedating medications from [facility], but he is in agony without anything, but becomes encephalopathic with meds. Difficult situation.   Acute renal failure further c/b urinary retention req'd Urology placement not foley 11/16 Plan Keep foley in place  Strict I&O Renal dose meds F/u chem  MAP > 65  Hypernatremia A little worse w/ diuretics Plan Free water  Elevated Alk phos  Improving Plan Monitor  Anemia No evidence of acute bleeding Plan Trend cbc Trigger for transfusion < 7   Protein calorie malnutrition  Plan Tube feeds  Stage 2 pressure ulcer sacrum, present on admission Plan Cont wound care   H/o HTN  Plan Holding antihypertensives    Critical care time: 46 minutes    Deward [place], AGACNP-BC [facility]  See Amion for personal pager PCCM on call pager [phone] until 7pm. Please call Elink 7p-7a. [phone]  04/29/2024 8:14 AM

## 2024-04-29 NOTE — Progress Notes (Signed)
 Patient dislodged orogastric tube this morning so this RN re-inserted the orogastric tube and waited for abdominal x-ray results and confirmation before administering medications.  [name] [name]

## 2024-04-30 ENCOUNTER — Inpatient Hospital Stay (HOSPITAL_COMMUNITY): Payer: MEDICAID

## 2024-04-30 DIAGNOSIS — I079 Rheumatic tricuspid valve disease, unspecified: Secondary | ICD-10-CM

## 2024-04-30 DIAGNOSIS — R609 Edema, unspecified: Secondary | ICD-10-CM

## 2024-04-30 DIAGNOSIS — T17998A Other foreign object in respiratory tract, part unspecified causing other injury, initial encounter: Secondary | ICD-10-CM

## 2024-04-30 DIAGNOSIS — K6812 Psoas muscle abscess: Secondary | ICD-10-CM

## 2024-04-30 DIAGNOSIS — M4620 Osteomyelitis of vertebra, site unspecified: Secondary | ICD-10-CM

## 2024-04-30 DIAGNOSIS — I2690 Septic pulmonary embolism without acute cor pulmonale: Secondary | ICD-10-CM

## 2024-04-30 LAB — GLUCOSE, CAPILLARY
Glucose-Capillary: 99 mg/dL (ref 70–99)
Glucose-Capillary: 93 mg/dL (ref 70–99)
Glucose-Capillary: 81 mg/dL (ref 70–99)
Glucose-Capillary: 53 mg/dL — ABNORMAL LOW (ref 70–99)
Glucose-Capillary: 100 mg/dL — ABNORMAL HIGH (ref 70–99)
Glucose-Capillary: 93 mg/dL (ref 70–99)
Glucose-Capillary: 106 mg/dL — ABNORMAL HIGH (ref 70–99)

## 2024-04-30 LAB — BASIC METABOLIC PANEL WITH GFR
Anion gap: 12 (ref 5–15)
BUN: 85 mg/dL — ABNORMAL HIGH (ref 6–20)
CO2: 24 mmol/L (ref 22–32)
Calcium: 7.9 mg/dL — ABNORMAL LOW (ref 8.9–10.3)
Chloride: 103 mmol/L (ref 98–111)
Creatinine, Ser: 2.95 mg/dL — ABNORMAL HIGH (ref 0.61–1.24)
GFR, Estimated: 26 mL/min — ABNORMAL LOW (ref >60)
Glucose, Bld: 111 mg/dL — ABNORMAL HIGH (ref 70–99)
Potassium: 3.8 mmol/L (ref 3.5–5.1)
Sodium: 139 mmol/L (ref 135–145)

## 2024-04-30 LAB — HEMOGLOBIN AND HEMATOCRIT, BLOOD
HCT: 22 % — ABNORMAL LOW (ref 39.0–52.0)
Hemoglobin: 7 g/dL — ABNORMAL LOW (ref 13.0–17.0)

## 2024-04-30 LAB — CBC
HCT: 19.7 % — ABNORMAL LOW (ref 39.0–52.0)
HCT: 19.2 % — ABNORMAL LOW (ref 39.0–52.0)
HCT: 19.8 % — ABNORMAL LOW (ref 39.0–52.0)
Hemoglobin: 5.9 g/dL — CL (ref 13.0–17.0)
Hemoglobin: 5.8 g/dL — CL (ref 13.0–17.0)
Hemoglobin: 6 g/dL — CL (ref 13.0–17.0)
MCH: 29.4 pg (ref 26.0–34.0)
MCH: 29.6 pg (ref 26.0–34.0)
MCH: 29.4 pg (ref 26.0–34.0)
MCHC: 29.9 g/dL — ABNORMAL LOW (ref 30.0–36.0)
MCHC: 30.2 g/dL (ref 30.0–36.0)
MCHC: 30.3 g/dL (ref 30.0–36.0)
MCV: 98 fL (ref 80.0–100.0)
MCV: 98 fL (ref 80.0–100.0)
MCV: 97.1 fL (ref 80.0–100.0)
Platelets: 95 K/uL — ABNORMAL LOW (ref 150–400)
Platelets: 105 K/uL — ABNORMAL LOW (ref 150–400)
Platelets: 97 K/uL — ABNORMAL LOW (ref 150–400)
RBC: 2.01 MIL/uL — ABNORMAL LOW (ref 4.22–5.81)
RBC: 1.96 MIL/uL — ABNORMAL LOW (ref 4.22–5.81)
RBC: 2.04 MIL/uL — ABNORMAL LOW (ref 4.22–5.81)
RDW: 16.5 % — ABNORMAL HIGH (ref 11.5–15.5)
RDW: 16.9 % — ABNORMAL HIGH (ref 11.5–15.5)
RDW: 16.6 % — ABNORMAL HIGH (ref 11.5–15.5)
WBC: 6.6 K/uL (ref 4.0–10.5)
WBC: 6.4 K/uL (ref 4.0–10.5)
WBC: 6.9 K/uL (ref 4.0–10.5)
nRBC: 0 % (ref 0.0–0.2)
nRBC: 0 % (ref 0.0–0.2)
nRBC: 0 % (ref 0.0–0.2)

## 2024-04-30 LAB — PHOSPHORUS: Phosphorus: 7.3 mg/dL — ABNORMAL HIGH (ref 2.5–4.6)

## 2024-04-30 LAB — CULTURE, RESPIRATORY W GRAM STAIN: Gram Stain: NONE SEEN

## 2024-04-30 LAB — MAGNESIUM: Magnesium: 1.9 mg/dL (ref 1.7–2.4)

## 2024-04-30 LAB — PREPARE RBC (CROSSMATCH)

## 2024-04-30 MED ORDER — FUROSEMIDE 10 MG/ML IJ SOLN
60.0000 mg | Freq: Once | INTRAMUSCULAR | Status: AC
  Administered 2024-04-30: 60 mg via INTRAVENOUS
  Filled 2024-04-30: qty 6

## 2024-04-30 MED ORDER — CEFAZOLIN SODIUM-DEXTROSE 2-4 GM/100ML-% IV SOLN
2.0000 g | Freq: Three times a day (TID) | INTRAVENOUS | Status: DC
  Administered 2024-04-30 – 2024-05-10 (×30): 2 g via INTRAVENOUS
  Filled 2024-04-30 (×30): qty 100

## 2024-04-30 MED ORDER — SODIUM CHLORIDE 0.9% IV SOLUTION: Freq: Once | INTRAVENOUS | Status: AC

## 2024-04-30 MED ORDER — SODIUM CHLORIDE 3 % IN NEBU
4.0000 mL | INHALATION_SOLUTION | Freq: Two times a day (BID) | RESPIRATORY_TRACT | Status: AC
Start: 2024-04-30 — End: 2024-05-03
  Administered 2024-04-30 – 2024-05-03 (×6): 4 mL via RESPIRATORY_TRACT
  Filled 2024-04-30 (×6): qty 15

## 2024-04-30 NOTE — Progress Notes (Signed)
 eLink Physician-Brief Progress Note Patient Name: [name] [name] DOB: 1976/02/06 MRN: [id number]   Date of Service  04/30/2024  HPI/Events of Note  Hemoglobin 5.9, changes in WBC and platelet counts as well.  No active bleeding.  eICU Interventions  Repeat CBC pending     Intervention Category Intermediate Interventions: Bleeding - evaluation and treatment with blood products  Ahmari Duerson 04/30/2024, 6:10 AM

## 2024-04-30 NOTE — Progress Notes (Addendum)
 NAME:  [name] [name], MRN:  [id number], DOB:  Oct 21, 1975, LOS: 16 ADMISSION DATE:  04/14/2024, CONSULTATION DATE:  04/14/2024 REFERRING MD:  [name] [name] - [facility] EDP, CHIEF COMPLAINT:  AMS  History of Present Illness:  49 year old man who presented to [facility] ED 11/2 with AMS. PMHx significant for MSSA bacteremia, MSSA PNA, infective endocarditis, cervical through lumbar osteomyelitis, psoas abscess, severe protein calorie malnutrition/FTT, substance abuse. Recent admission 10/2 - 10/13 for sepsis, widespread MSSA.  Arrived to [facility] ED covered in feces, altered and with poor airway protection; he was subsequently intubated. CXR demonstrated RLL opacities. Confirmed bilateral septic pulmonary emboli, tricuspid valve endocarditis.  Required intubation and mechanical ventilation. Clinically progressed and was transferred out of ICU 11/10.  Reconsulted 11/13 for AMS, tachypnea and tachycardia.   Pertinent Medical History:  MSSA bacteremia MSSA PNA Lumbar osteomyelitis  Psoas abscess  Severe protein calorie malnutrition FTT in adult  Significant Hospital Events: Including procedures, antibiotic start and stop dates in addition to other pertinent events   11/2 [facility] ED w AMS. Intubated. Started on vanc mero. Txf request to [place] to [facility]  11/3 extubated, developed respiratory distress requiring reintubation. Brief cardiac arrest during intubation.  11/3 transthoracic echocardiogram > LVEF 40-45%, global hypokinesis, grade 1 diastolic dysfunction, normal RV function.  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  No mitral or aortic valve involvement 11/3 CT chest, abdomen, pelvis >> numerous bilateral cavitary and solid nodules consistent with septic emboli, left lower lobe consolidation with a small pleural effusion, right basilar atelectasis.  Moderate free fluid in the pelvis.  Erosive changes at T5-T6, L2-L3, L4-L5 suggestive of discitis 11/3 CT head >> no acute abnormality.   Opacified left maxillary sinus 11/10 Transferred out of ICU. 11/13 PCCM reconsulted for decreased mental status, tachycardia, tachypnea. Started diuretics 11/16 Poorly responsive, transferred back to ICU and intubated for the third time. Pressors started post intubation. Renal function worse. UOP poor.   Interim History/Subjective:  No significant events Weaned all day yesterday, vent rest at night Only able to wean briefly this AM CXR with bilateral nodular densities, L > R; progressive LLL airspace opacity Hgb 6.0, repeat stable, 1U PRBCs ordered Cr continues to worsen with oliguria Ongoing GOC discussions  Objective:   Blood pressure 105/75, pulse 96, temperature 97.9 F (36.6 C), temperature source Axillary, resp. rate (!) 21, height 6' (1.829 m), weight 69.5 kg, SpO2 100%. CVP:  [6 mmHg-14 mmHg] 9 mmHg  Vent Mode: PRVC FiO2 (%):  [40 %] 40 % Set Rate:  [24 bmp] 24 bmp Vt Set:  [470 mL] 470 mL PEEP:  [5 cmH20] 5 cmH20 Pressure Support:  [5 cmH20] 5 cmH20 Plateau Pressure:  [19 cmH20] 19 cmH20   Intake/Output Summary (Last 24 hours) at 04/30/2024 0730 Last data filed at 04/30/2024 9365 Gross per 24 hour  Intake 2292.73 ml  Output 460 ml  Net 1832.73 ml   Filed Weights   04/27/24 0500 04/28/24 1014 04/29/24 0704  Weight: 74 kg 69.5 kg 69.5 kg   Physical Examination: General: Acutely ill-appearing middle-aged man in NAD. HEENT: [name] [name]/AT, anicteric sclera, PERRL 3mm brisk, moist mucous membranes. Neuro: Drowsy, wakes easily to voice/opens eyes. Responds to verbal stimuli. and Withdraws to pain in all 4 extremities. Following commands intermittently. +Corneal, +Cough, and +Gag  CV: RRR, no m/g/r. PULM: Breathing even and unlabored on vent (PEEP 5, FiO2 40%). Lung fields diminished at bases bilaterally, L > R. GI: Soft, nontender, nondistended. Normoactive bowel sounds.  Extremities: Bilateral symmetric 1-2+ pitting LE edema noted. Dependent edema of BUE noted. Skin: Warm/dry,  no rashes.  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 Last MRSA PCR was negative on 11/2; ID stopped Zyvox  11/16. MRSA swab negative. - ID following, appreciate recommendations - Continue Zosyn , duration TBD  Acute hypoxic and hypercarbic respiratory failure 2/2 MSSA PNA, ARDS, mucous plugging, and septic pulmonary emboli Deconditioning and generalized weakness are contributing factors along w/ ineffective airway clearance. POCUS 11/16 no evidence of effusion. - Continue full vent support (4-8cc/kg IBW) - Wean FiO2 for O2 sat > 90% - Daily WUA/SBT - VAP bundle - Pulmonary hygiene - PAD protocol for sedation: Propofol  and Fentanyl  for goal RASS 0 to -1 - Follow Resp Cx - Bronch with BAL 11/18 vs 11/19 - Ongoing GOC discussion re: aggressive care (to include trach, given third intubation) versus transition to comfort-focused care; spouse [name] - Appreciate PMT assistance  Post-intubation hypotension History of hypertension - Goal MAP > 65 - Levophed  titrated to goal MAP + vaso, off currently - Albumin  resuscitation as tolerated, concern for volume overload given worsening renal failure - Hold home antihypertensives for now  Acute Metabolic Encephalopathy 2/2 hypercarbia superimposed difficult to control pain and h/o substance abuse and chronic anxiety  Minimal response to narcan  previously. - Correct metabolic derangements as able - Limit sedating medications as able; low doses when necessary; avoid polypharmacy - Delirium precautions - PAD protocol as above  Acute renal failure further c/b urinary retention req'd Urology placement not foley 11/16 Hypernatremia - Trend BMP, Cr and BUN rising, now oliguric - Lasix  60mg  IV today post-blood - Replete electrolytes as indicated -  Monitor I&Os - F/u urine studies - Avoid nephrotoxic agents as able - Ensure adequate renal perfusion - Keep Foley in place (placed by Urology)  Anemia, unknown etiology - Trend H&H - Monitor for signs of active bleeding - Transfuse for Hgb < 7.0 or hemodynamically significant bleeding - 1U PRBCs given 11/18 for Hgb 6.0  Elevated Alk phos  - Trend LFTs  Protein calorie malnutrition  - Appreciate RD input - Continue TF via OGT  Stage 2 pressure ulcer sacrum, present on admission - WOC  Critical care time:   The patient is critically ill with multiple organ system failure and requires high complexity decision making for assessment and support, frequent evaluation and titration of therapies, advanced monitoring, review of radiographic studies and interpretation of complex data.   Critical Care Time devoted to patient care services, exclusive of separately billable procedures, described in this note is 37 minutes.  Corean CHRISTELLA Quintyn Dombek, PA-C [facility] 04/30/24 7:34 AM  Please see [email or web address] for pager details.  From 7A-7P if no response, please call [phone] After hours, please call ELink [phone]

## 2024-04-30 NOTE — Progress Notes (Signed)
 Nutrition Follow-up  DOCUMENTATION CODES:  Severe malnutrition in context of social or environmental circumstances  INTERVENTION:  TF via OGT: Osmolite 1.5 at 51ml/hr ( per day) 60ml ProSource TF20 once daily Provides 2060 kcal, 103g protein, free water daily  Free water flushes 200ml q6h ( daily) Total free water (TF+FWF)-   NUTRITION DIAGNOSIS:  Severe Malnutrition related to social / environmental circumstances (IVDU and alcohol abuse) as evidenced by severe fat depletion, severe muscle depletion. - remains applicable  GOAL:  Patient will meet greater than or equal to 90% of their needs   MONITOR:   Vent status, TF tolerance, Labs  REASON FOR ASSESSMENT:   Consult, Ventilator Enteral/tube feeding initiation and management  ASSESSMENT:   Pt admitted with metabolic encephalopathy and presumed septic shock d/t metastatic MSSA infection with concern for endocarditis. PMH significant for recent admission (10/2-10/13 leaving AMA) for MSSA, PNA, psoas abscesses and lumbar osteomyelitis, tobacco use, IVDU and alcohol use  11/2: admitted, [facility] from [facility] to [facility] 11/3: extubated; respiratory distress; re-intubated, cardiac arrest, ROSC; echo>LVEF 40-45%, normal RV function 11/7: cortrak placed 11/6: extubated; diet advanced to regular 11/10: transfer out of ICU 11/16: AMS, tachypnea, tachycardic; re-intubated d/t respiratory failure  Patient is currently intubated on ventilator support MV: 10.1 L/min Temp (24hrs), Avg:98.2 F (36.8 C), Min:97.9 F (36.6 C), Max:98.5 F (36.9 C)  Renal function worsening. UOP poor.  PMT goals of care discussion pending re: trach vs comfort.   Admit weight: 62.5 kg Current weight: 72.6 kg  OGT (tip and side port within the stomach)  UOP: x24 hours  Medications: folvite , MVI Drips: Abx Propofol  @ 6.26ml/hr   Labs:  BUN 85 Cr 2.95 Phosphorus 7. GFR 26 Hgb 6.0 CBG's 93-113 x24 hours  Diet  Order:   Diet Order             Diet NPO time specified  Diet effective now                   EDUCATION NEEDS:  Not appropriate for education at this time  Skin:  Skin Integrity Issues:: Stage I Stage I: right coccyx Stage II: Per WOC: 2 ruptured blisters 2 cm x 1 cm x 0.1 cm and 0.5 cm x 0.5 cm x 0.1 cm.  Last BM:  11/16 type 5 medium  Height:  Ht Readings from Last 1 Encounters:  04/19/24 6' (1.829 m)    Weight:  Wt Readings from Last 1 Encounters:  04/30/24 72.6 kg   BMI:  Body mass index is 21.71 kg/m.  Estimated Nutritional Needs:   Kcal:  2000-2200  Protein:  100-115g  Fluid:  >/=2L  Royce Maris, RDN, LDN Clinical Nutrition See AMiON for contact information.

## 2024-04-30 NOTE — Progress Notes (Signed)
 Daily Progress Note   Patient Name: [name] [name]       Date: 04/30/2024 DOB: 1976/03/02  Age: 48 y.o. MRN#: [id number] Attending Physician: Neda Jennet LABOR, MD Primary Care Physician: Bertell Satterfield, MD Admit Date: 04/14/2024  Reason for Consultation/Follow-up: Establishing goals of care  Patient Profile/HPI: 48 y.o. male  with past medical history of substance abuse, recent hospitalization for sepsis r/t MSSA bacteremia with septic pneumonia and PE which he left AMA without completing recommended antibiotics, lumbar discitis and septic facet joint admitted on 04/14/2024 with sepsis d/t MSSA bacteremia. Admission has been complicated due to findings of mobile vegetation on atrial valve- per CVTS no surgical interventions recommended. He has been intubated 3 times. Had PEA cardiac arrest on 11/3 with 4 mins CPR.  Last intubation was 11/16. He has severe back pain. MRI this admission shows septic arthritis at skill base, T5-T6 osteomyelitis/discitis.  He is currently intubated. Levophed turned off today- 11/17. Sedated with propofol . Palliative medicine consulted for assistance with medical decision making.   Subjective: Chart reviewed including labs, progress notes, imaging from this and previous encounters.  CMET shows worsening renal function with Cr 2.95. Hgb this morning 5.9- no signs of active bleeding - platelets 95. Plan for one unit PRBC.  Patient tolerating pressure support. Sedated on propofol . Off pressor support.  Did not wake to my voice or touch.  Called spouse Warren- left message requesting return call.   Review of Systems  Unable to perform ROS: Mental status change     Physical Exam Vitals and nursing note reviewed.  Cardiovascular:     Rate and Rhythm: Normal rate.   Pulmonary:     Comments: intubated Neurological:     Comments: sedated             Vital Signs: BP 106/69   Pulse 79   Temp 98.1 F (36.7 C) (Axillary)   Resp (!) 24   Ht 6' (1.829 m)   Wt 72.6 kg   SpO2 100%   BMI 21.71 kg/m  SpO2: SpO2: 100 % O2 Device: O2 Device: Ventilator O2 Flow Rate: O2 Flow Rate (L/min): 5 L/min  Intake/output summary:  Intake/Output Summary (Last 24 hours) at 04/30/2024 1037 Last data filed at 04/30/2024 0733 Gross per 24 hour  Intake 2151.18 ml  Output 490 ml  Net 1661.18 ml   LBM: Last BM Date : 04/28/24 Baseline Weight: Weight: 62.5 kg Most recent weight: Weight: 72.6 kg       Palliative Assessment/Data: PPS: 20%      Patient Active Problem List   Diagnosis Date Noted   Sepsis (HCC) 04/14/2024   Severe sepsis (HCC) 04/14/2024   Protein-calorie malnutrition, severe 03/22/2024   Septic embolism (HCC) 03/15/2024   Pressure injury of skin 03/15/2024   Lactic acidosis 03/15/2024   Osteomyelitis of lumbar spine (HCC) 03/15/2024   Thrombocytopenia 03/15/2024   Transaminasemia 03/15/2024   Hypoalbuminemia 03/15/2024   Failure to thrive in adult 03/15/2024   Sepsis due to pneumonia (HCC) 11/07/2016   Leukopenia 11/07/2016   Acute respiratory failure with hypoxia and hypercapnia (HCC) 11/07/2016   Anxiety disorder 11/07/2016   Hyperbilirubinemia 11/07/2016   Fever 01/18/2014   MSSA bacteremia 01/18/2014   CAP (community acquired pneumonia) 05/19/2013   Sinus tachycardia 05/19/2013   Tobacco abuse 05/19/2013    Palliative Care Assessment & Plan    Assessment/Recommendations/Plan  -respiratory failure in the setting of sepsis MSSA bacteremia with pneumonia, septic PE, multiple levels of spinal discitis/ostemyelitis- continue current interventions -Warren was unable to discuss GOC during our phone call yesterday- she plans to call with time for meeting, I called her today and left a message requesting a return phone call  Code  Status:   Code Status: Full Code   Prognosis:  Unable to determine  Discharge Planning: To Be Determined    Thank you for allowing the Palliative Medicine Team to assist in the care of this patient.  Total time: 45 mins Prolonged billing:  Time includes:   Preparing to see the patient (e.g., review of tests) Obtaining and/or reviewing separately obtained history Performing a medically necessary appropriate examination and/or evaluation Counseling and educating the patient/family/caregiver Ordering medications, tests, or procedures Referring and communicating with other health care professionals (when not reported separately) Documenting clinical information in the electronic or other health record Independently interpreting results (not reported separately) and communicating results to the patient/family/caregiver Care coordination (not reported separately) Clinical documentation  Cassondra Stain, AGNP-C Palliative Medicine   Please contact Palliative Medicine Team phone at [phone] for questions and concerns.

## 2024-04-30 NOTE — Progress Notes (Signed)
 PT Cancellation Note  Patient Details Name: [name] [name] MRN: [id number] DOB: 1976-04-26   Cancelled Treatment:    Reason Eval/Treat Not Completed: Medical issues which prohibited therapy (Pt failed weaning per nurse.  Nurse recommended PT check back tomorrow.)   [name] 04/30/2024, 12:33 PM Kerri Asche M,PT Acute Rehab Services [phone]

## 2024-04-30 NOTE — Progress Notes (Signed)
 [facility]  Date of Admission:  04/14/2024     Reason for Follow Up: Sepsis [facility])  Total days of antibiotics 16         ASSESSMENT:  Mr. Arnesen is a 48 year old Caucasian gentleman admitted with MSSA pneumonia, bacteremia, tricuspid valve endocarditis, psoas abscess, and lumbar osteomyelitis.  Admitted on 11/2 with altered mental status and inability to protect airway requiring intubation.  Now extubated.  Neck and back imaging with multiple areas of osteomyelitis/discitis and abnormal widening of the atlantodental and basion-dens intervals secondary to acute septic arthritis. No surgical intervention planned.   Mr. Marchuk is now intubated secondary to acute respiratory failure. RASS score with -2 and opens eyes for <10 seconds. Discussed plan of care to change piperacillin-tazobactam back to Cefazolin  for continued MSSA treatment. Will need at least 6 weeks of treatment with blood cultures clearing on 04/16/24 and tentative end date of 12/16  pending any new findings. Continue standard/universal precautions. Remaining medical and supportive care per PCCM.   PLAN:  Change piperacillin-tazobactam to Cefazolin   Plan for 6 weeks of treatment with tentative end date of 05/28/24.  Standard/universal precautions. Remaining medical and supportive care per PCCM.   Principal Problem:   Sepsis (HCC) Active Problems:   MSSA bacteremia   Acute respiratory failure with hypoxia and hypercapnia (HCC)   Severe sepsis (HCC)    ALPRAZolam   0.75 mg Per Tube Q8H   Chlorhexidine Gluconate Cloth  6 each Topical Daily   feeding supplement (PROSource TF20)  60 mL Per Tube Daily   folic acid   1 mg Per Tube Daily   free water  200 mL Per Tube Q6H   gabapentin  100 mg Per Tube Q12H   guaiFENesin   5 mL Per Tube Q4H   lidocaine   1 patch Transdermal Q24H   multivitamin with minerals  1 tablet Per Tube Daily   mouth rinse  15 mL Mouth Rinse Q2H   oxyCODONE   5 mg Per Tube  Q6H    SUBJECTIVE:  Afebrile overnight with no acute events.  Appears to be tolerating antibiotics with no adverse side effects.  Intubated on propofol  with RASS score of -2.  No Known Allergies   Review of Systems: Review of Systems  Unable to perform ROS: Intubated      OBJECTIVE: Vitals:   04/30/24 1230 04/30/24 1300 04/30/24 1330 04/30/24 1400  BP: 106/74 105/68 110/78 105/68  Pulse:      Resp: (!) 27 (!) 21 (!) 25 (!) 28  Temp:      TempSrc:      SpO2:      Weight:      Height:       Body mass index is 21.71 kg/m.  Physical Exam Constitutional:      General: He is not in acute distress.    Appearance: He is well-developed. He is ill-appearing.     Interventions: He is intubated. Cervical collar in place.  Cardiovascular:     Rate and Rhythm: Normal rate and regular rhythm.     Heart sounds: Normal heart sounds.  Pulmonary:     Effort: Pulmonary effort is normal. He is intubated.     Breath sounds: Normal breath sounds.  Skin:    General: Skin is warm and dry.  Neurological:     Mental Status: He is oriented to person, place, and time. He is lethargic.     Lab Results Lab Results  Component Value Date   WBC  6.9 04/30/2024   HGB 6.0 (LL) 04/30/2024   HCT 19.8 (L) 04/30/2024   MCV 97.1 04/30/2024   PLT 97 (L) 04/30/2024    Lab Results  Component Value Date   CREATININE 2.95 (H) 04/30/2024   BUN 85 (H) 04/30/2024   NA 139 04/30/2024   K 3.8 04/30/2024   CL 103 04/30/2024   CO2 24 04/30/2024    Lab Results  Component Value Date   ALT <5 04/27/2024   AST 14 (L) 04/27/2024   ALKPHOS 109 04/27/2024   BILITOT 1.5 (H) 04/27/2024     Microbiology: Recent Results (from the past 240 hours)  MRSA Next Gen by PCR, Nasal     Status: None   Collection Time: 04/28/24  1:07 PM   Specimen: Nasal Mucosa; Nasal Swab  Result Value Ref Range Status   MRSA by PCR Next Gen NOT DETECTED NOT DETECTED Final    Comment: (NOTE) The GeneXpert MRSA Assay (FDA  approved for NASAL specimens only), is one component of a comprehensive MRSA colonization surveillance program. It is not intended to diagnose MRSA infection nor to guide or monitor treatment for MRSA infections. Test performance is not FDA approved in patients less than 60 years old. Performed at [facility], [address] [place], [place] [address]   Culture, Respiratory w Gram Stain     Status: None   Collection Time: 04/28/24  4:02 PM   Specimen: Tracheal Aspirate; Respiratory  Result Value Ref Range Status   Specimen Description TRACHEAL ASPIRATE  Final   Special Requests NONE  Final   Gram Stain   Final    NO WBC SEEN FEW YEAST WITH PSEUDOHYPHAE FEW BUDDING YEAST SEEN Performed at [facility], [address], [place], [place] [address]    Culture   Final    MODERATE CANDIDA TROPICALIS MODERATE CANDIDA ALBICANS    Report Status 04/30/2024 FINAL  Final     Cathlyn July, NP [facility]  04/30/2024  2:29 PM

## 2024-04-30 NOTE — Progress Notes (Signed)
 BLE venous duplex has been completed.   Results can be found under chart review under CV PROC. 04/30/2024 1:38 PM Fredrick Geoghegan RVT, RDMS

## 2024-04-30 NOTE — Plan of Care (Signed)
 Care plan updated.

## 2024-04-30 NOTE — Progress Notes (Signed)
 OT Cancellation Note  Patient Details Name: [name] [name] MRN: [id number] DOB: 02-Oct-1975   Cancelled Treatment:    Reason Eval/Treat Not Completed: Patient not medically ready (Per nursing hold today; will reattempt later date.)  [facility] 04/30/2024, 1:07 PM Kreg Sink, OT/L   Acute OT Clinical Specialist Acute Rehabilitation Services Pager [phone] Office [phone]

## 2024-05-01 ENCOUNTER — Inpatient Hospital Stay (HOSPITAL_COMMUNITY): Payer: MEDICAID

## 2024-05-01 LAB — CBC
HCT: 21.8 % — ABNORMAL LOW (ref 39.0–52.0)
Hemoglobin: 6.8 g/dL — CL (ref 13.0–17.0)
MCH: 29.7 pg (ref 26.0–34.0)
MCHC: 31.2 g/dL (ref 30.0–36.0)
MCV: 95.2 fL (ref 80.0–100.0)
Platelets: 101 K/uL — ABNORMAL LOW (ref 150–400)
RBC: 2.29 MIL/uL — ABNORMAL LOW (ref 4.22–5.81)
RDW: 17 % — ABNORMAL HIGH (ref 11.5–15.5)
WBC: 7.6 K/uL (ref 4.0–10.5)
nRBC: 0 % (ref 0.0–0.2)

## 2024-05-01 LAB — BASIC METABOLIC PANEL WITH GFR
Anion gap: 16 — ABNORMAL HIGH (ref 5–15)
BUN: 92 mg/dL — ABNORMAL HIGH (ref 6–20)
CO2: 24 mmol/L (ref 22–32)
Calcium: 7.9 mg/dL — ABNORMAL LOW (ref 8.9–10.3)
Chloride: 99 mmol/L (ref 98–111)
Creatinine, Ser: 3.23 mg/dL — ABNORMAL HIGH (ref 0.61–1.24)
GFR, Estimated: 23 mL/min — ABNORMAL LOW (ref >60)
Glucose, Bld: 122 mg/dL — ABNORMAL HIGH (ref 70–99)
Potassium: 3.9 mmol/L (ref 3.5–5.1)
Sodium: 139 mmol/L (ref 135–145)

## 2024-05-01 LAB — URINALYSIS, ROUTINE W REFLEX MICROSCOPIC
Bilirubin Urine: NEGATIVE
Glucose, UA: NEGATIVE mg/dL
Ketones, ur: NEGATIVE mg/dL
Nitrite: NEGATIVE
Protein, ur: 30 mg/dL — AB
RBC / HPF: >50 RBC/hpf (ref 0–5)
Specific Gravity, Urine: 1.011 (ref 1.005–1.030)
pH: 5 (ref 5.0–8.0)

## 2024-05-01 LAB — GLUCOSE, CAPILLARY
Glucose-Capillary: 112 mg/dL — ABNORMAL HIGH (ref 70–99)
Glucose-Capillary: 105 mg/dL — ABNORMAL HIGH (ref 70–99)
Glucose-Capillary: 110 mg/dL — ABNORMAL HIGH (ref 70–99)
Glucose-Capillary: 109 mg/dL — ABNORMAL HIGH (ref 70–99)
Glucose-Capillary: 116 mg/dL — ABNORMAL HIGH (ref 70–99)
Glucose-Capillary: 122 mg/dL — ABNORMAL HIGH (ref 70–99)

## 2024-05-01 LAB — HEMOGLOBIN AND HEMATOCRIT, BLOOD
HCT: 24.4 % — ABNORMAL LOW (ref 39.0–52.0)
Hemoglobin: 8 g/dL — ABNORMAL LOW (ref 13.0–17.0)

## 2024-05-01 LAB — MAGNESIUM: Magnesium: 1.9 mg/dL (ref 1.7–2.4)

## 2024-05-01 LAB — PHOSPHORUS: Phosphorus: 6.9 mg/dL — ABNORMAL HIGH (ref 2.5–4.6)

## 2024-05-01 LAB — PREPARE RBC (CROSSMATCH)

## 2024-05-01 MED ORDER — SODIUM CHLORIDE 0.9% IV SOLUTION: Freq: Once | INTRAVENOUS | Status: AC

## 2024-05-01 MED ORDER — ALPRAZOLAM 0.5 MG PO TABS
0.5000 mg | ORAL_TABLET | Freq: Three times a day (TID) | ORAL | Status: DC
  Administered 2024-05-01 – 2024-05-04 (×9): 0.5 mg
  Filled 2024-05-01 (×9): qty 1

## 2024-05-01 NOTE — Progress Notes (Addendum)
 NAME:  [name] [name], MRN:  [id number], DOB:  [birth year], LOS: 17 ADMISSION DATE:  04/14/2024, CONSULTATION DATE:  04/14/2024 REFERRING MD:  [name] [name] - [facility] EDP, CHIEF COMPLAINT:  AMS  History of Present Illness:  48 year old man who presented to [facility] ED 11/2 with AMS. PMHx significant for MSSA bacteremia, MSSA PNA, infective endocarditis, cervical through lumbar osteomyelitis, psoas abscess, severe protein calorie malnutrition/FTT, substance abuse. Recent admission 10/2 - 10/13 for sepsis, widespread MSSA.  Arrived to [facility] ED covered in feces, altered and with poor airway protection; he was subsequently intubated. CXR demonstrated RLL opacities. Confirmed bilateral septic pulmonary emboli, tricuspid valve endocarditis.  Required intubation and mechanical ventilation. Clinically progressed and was transferred out of ICU 11/10.  Reconsulted 11/13 for AMS, tachypnea and tachycardia.   Pertinent Medical History:  MSSA bacteremia MSSA PNA Lumbar osteomyelitis  Psoas abscess  Severe protein calorie malnutrition FTT in adult  Significant Hospital Events: Including procedures, antibiotic start and stop dates in addition to other pertinent events   11/2 [facility] ED w AMS. Intubated. Started on vanc mero. Txf request to [place] to [facility]  11/3 Extubated, developed respiratory distress requiring reintubation. Brief cardiac arrest during intubation.  11/3 Transthoracic echocardiogram > LVEF 40-45%, global hypokinesis, grade 1 diastolic dysfunction, normal RV function.  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  No mitral or aortic valve involvement 11/3 CT Chest, abdomen, pelvis >> numerous bilateral cavitary and solid nodules consistent with septic emboli, left lower lobe consolidation with a small pleural effusion, right basilar atelectasis.  Moderate free fluid in the pelvis.  Erosive changes at T5-T6, L2-L3, L4-L5 suggestive of discitis 11/3 CT Head >> no acute abnormality.   Opacified left maxillary sinus 11/10 Transferred out of ICU. 11/13 PCCM reconsulted for decreased mental status, tachycardia, tachypnea. Started diuretics 11/16 Poorly responsive, transferred back to ICU and intubated for the third time. Pressors started post intubation. Renal function worse. UOP poor.   Interim History/Subjective:  No significant events Hgb 6.8 this AM, 1U PRBCs given UOP improved with Lasix 60mg  IV x 1, Weaning on vent, yesterday failed due to low Vt, today failed due to low rate More awake today, calm, following commands Resp Cx with mod candida (albicans, tropicalis)  Objective:   Blood pressure 104/75, pulse 85, temperature 98.5 F (36.9 C), temperature source Axillary, resp. rate 11, height 6' (1.829 m), weight 72.6 kg, SpO2 98%. CVP:  [5 mmHg-15 mmHg] 13 mmHg  Vent Mode: CPAP;PSV FiO2 (%):  [40 %] 40 % Set Rate:  [24 bmp] 24 bmp Vt Set:  [470 mL] 470 mL PEEP:  [5 cmH20] 5 cmH20 Pressure Support:  [10 cmH20] 10 cmH20 Plateau Pressure:  [19 cmH20] 19 cmH20   Intake/Output Summary (Last 24 hours) at 05/01/2024 0827 Last data filed at 05/01/2024 9357 Gross per 24 hour  Intake 2841.95 ml  Output 1220 ml  Net 1621.95 ml   Filed Weights   04/28/24 1014 04/29/24 0704 04/30/24 0702  Weight: 69.5 kg 69.5 kg 72.6 kg   Physical Examination: General: Acutely ill-appearing middle-aged man in NAD. HEENT: [place]/AT, anicteric sclera, PERRL, moist mucous membranes. Neuro: Awake but drowsy, calm. Responds to verbal stimuli. Following commands consistently. Generalized weakness. +Cough and +Gag  CV: RRR, no murmur, ?slight click heard over apex. PULM: Breathing even and unlabored on vent (PEEP 5, FiO2 40%). Lung fields diminished bilaterally, L > R. GI: Soft, nontender, nondistended. Normoactive bowel sounds. Extremities: Bilateral symmetric 1+ LE edema noted. Skin:  Warm/dry, no rashes.  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 Last MRSA PCR was negative on 11/2; ID stopped Zyvox  11/16. MRSA swab negative. - ID following, appreciate recommendations - Narrow Zosyn to cefazolin  per ID - No indication for treatment of candida at present  Acute hypoxic and hypercarbic respiratory failure 2/2 MSSA PNA, ARDS, mucous plugging, and septic pulmonary emboli Deconditioning and generalized weakness are contributing factors along w/ ineffective airway clearance. POCUS 11/16 no evidence of effusion. - Continue full vent support (4-8cc/kg IBW) - Wean FiO2 for O2 sat > 90% - Daily WUA/SBT - VAP bundle - Pulmonary hygiene - PAD protocol for sedation: Propofol  and Fentanyl  for goal RASS 0 to -1 - Follow finalized Resp Cx - Bronch 11/19 if ongoing inability to wean, CXR stable - Ongoing GOC discussion re: aggressive care (including trach, given third intubation) versus transition to comfort-focused care; spouse [name] [name] - Appreciate PMT assistance  Post-intubation hypotension History of hypertension - Goal MAP > 65 - Levophed titrated to goal MAP + vaso, remains off of pressors - Volume resuscitation as tolerated (blood versus albumin), caution in the setting of volume overload with worsening renal failure - Hold home antihypertensives  Acute Metabolic Encephalopathy 2/2 hypercarbia superimposed difficult to control pain and h/o substance abuse and chronic anxiety  Minimal response to narcan previously. - Correct metabolic derangements as able - Limit sedating medications, low doses when necessary, avoid polypharmacy - Delirium precautions - PAD protocol as above  Acute renal failure further c/b urinary retention req'd Urology placement not foley 11/16 Hypernatremia - Trend BMP, Cr/BUN rising, some UOP in response to Lasix (1250mL after 60mg  IV) -  Replete electrolytes as indicated - Monitor I&Os - Keep Foley in place (placed by Urology) - Avoid nephrotoxic agents as able - Ensure adequate renal perfusion - Nephro consult today, 11/19  Anemia, unknown etiology - Trend H&H - Monitor for signs of active bleeding - Transfuse for Hgb < 7.0 or hemodynamically significant bleeding - 1U PRBCs given 11/18 got Hgb 6.0, additional 1U PRBCs 11/19AM for Hgb 6.8  Elevated Alk phos  - Intermittent LFT monitoring  Protein calorie malnutrition  - Appreciate RD assistance - TF via OGT  Stage 2 pressure ulcer sacrum, present on admission - WOC  Critical care time:   The patient is critically ill with multiple organ system failure and requires high complexity decision making for assessment and support, frequent evaluation and titration of therapies, advanced monitoring, review of radiographic studies and interpretation of complex data.   Critical Care Time devoted to patient care services, exclusive of separately billable procedures, described in this note is 34 minutes.  Corean CHRISTELLA Reese, PA-C [place] [facility] 05/01/24 8:27 AM  Please see [email or web address] for pager details.  From 7A-7P if no response, please call [phone] After hours, please call Ema [phone]  Patient seen, independently examined, care plan was formulated and discussed with Corean resuspended mentation  Patient was admitted with altered mental status, septic pulmonary emboli, tricuspid valve endocarditis Intubated for the third time for respiratory failure  On vent, tried weaning but failed due to low rates Able to follow commands  Middle-aged, chronically ill-appearing Moist oral mucosa Following commands S1-S2 appreciated Coarse breath sounds Bowel sounds appreciated  I reviewed last 24 h vitals and pain scores, last 48 h  intake and output, last 24 h labs and trends, and last 24 h imaging results. H&H low this morning Will receive  transfusion  MSSA infective endocarditis, tricuspid valve endocarditis MSSA lumbar osteomyelitis Psoas abscess Pulmonary emboli - Appreciate ID input - Cefazolin   Hypoxic and hypercarbic respiratory failure secondary to pneumonia, ARDS, mucous plugging -Continue full ventilator support -Weaning as tolerated -Chest x-ray unchanged  Metabolic encephalopathy due to hypercarbia - Appears to be improving  Acute kidney injury -Avoid nephrotoxic agents - Continue to monitor closely - Consult renal with increasing renal parameters  Anemia of chronic disease No acute blood loss noted  Protein calorie malnutrition  Continue lines of care  The patient is critically ill with multiple organ systems failure and requires high complexity decision making for assessment and support, frequent evaluation and titration of therapies, application of advanced monitoring technologies and extensive interpretation of multiple databases. Critical Care Time devoted to patient care services described in this note independent of APP/resident time (if applicable)  is 31 minutes.   Jennet Epley MD [place] Personal pager: See Amion If unanswered, please page CCM On-call: #[phone]

## 2024-05-01 NOTE — Progress Notes (Signed)
 Daily Progress Note   Patient Name: [name] [name]       Date: 05/01/2024 DOB: 1975/06/22  Age: 48 y.o. MRN#: [id number] Attending Physician: Neda Jennet LABOR, MD Primary Care Physician: Bertell Satterfield, MD Admit Date: 04/14/2024  Reason for Consultation/Follow-up: Establishing goals of care  Patient Profile/HPI: 48 y.o. male  with past medical history of substance abuse, recent hospitalization for sepsis r/t MSSA bacteremia with septic pneumonia and PE which he left AMA without completing recommended antibiotics, lumbar discitis and septic facet joint admitted on 04/14/2024 with sepsis d/t MSSA bacteremia. 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 He has been intubated 3 times. Had PEA cardiac arrest on 11/3 with 4 mins CPR.  Last intubation was 11/16. He has severe back pain. MRI this admission shows septic arthritis at skill base, T5-T6 osteomyelitis/discitis.  He is currently intubated. Levophed turned off 11/17. Sedated with propofol . Palliative medicine consulted for assistance with medical decision making.   Subjective: Chart reviewed including labs, progress notes, imaging from this and previous encounters.  Renal function worse today- Cr 3.23. He failed weaning today due to low rate. Hgb 6.8- receiving blood transfusion today.  On eval- sedated with propofol .  Called patient's spouse- Warren. She is unable to discuss GOC with me today.  She is attempting to arrange transportation to hospital tomorrow- she will call and tell me what time.     Review of Systems  Unable to perform ROS: Mental status change     Physical Exam Vitals and nursing note reviewed.   Cardiovascular:     Rate and Rhythm: Normal rate.  Pulmonary:     Comments: intubated Neurological:     Comments: sedated             Vital Signs: BP (!) 137/92   Pulse 94   Temp 99.8 F (37.7 C) (Oral)   Resp (!) 23   Ht 6' (1.829 m)   Wt 72.6 kg   SpO2 100%   BMI 21.71 kg/m  SpO2: SpO2: 100 % O2 Device: O2 Device: Ventilator O2 Flow Rate: O2 Flow Rate (L/min): 5 L/min  Intake/output summary:  Intake/Output Summary (Last 24 hours) at 05/01/2024 1232 Last data filed at 05/01/2024 1000 Gross per 24  hour  Intake 2515.53 ml  Output 1055 ml  Net 1460.53 ml   LBM: Last BM Date : 04/28/24 Baseline Weight: Weight: 62.5 kg Most recent weight: Weight: 72.6 kg       Palliative Assessment/Data: PPS: 20%      Patient Active Problem List   Diagnosis Date Noted   Psoas abscess (HCC) 04/30/2024   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   Acute septic pulmonary embolism without acute cor pulmonale (HCC) 04/30/2024   Sepsis (HCC) 04/14/2024   Severe sepsis (HCC) 04/14/2024   Protein-calorie malnutrition, severe 03/22/2024   Septic embolism (HCC) 03/15/2024   Pressure injury of skin 03/15/2024   Lactic acidosis 03/15/2024   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   Thrombocytopenia 03/15/2024   Transaminasemia 03/15/2024   Hypoalbuminemia 03/15/2024   Failure to thrive in adult 03/15/2024   Sepsis due to pneumonia (HCC) 11/07/2016   Leukopenia 11/07/2016   Acute respiratory failure with hypoxia and hypercapnia (HCC) 11/07/2016   Anxiety disorder 11/07/2016   Hyperbilirubinemia 11/07/2016   Fever 01/18/2014   MSSA bacteremia 01/18/2014   CAP (community acquired pneumonia) 05/19/2013   Sinus tachycardia 05/19/2013   Tobacco abuse 05/19/2013    Palliative Care Assessment & Plan    Assessment/Recommendations/Plan  -respiratory failure in the setting of sepsis MSSA bacteremia, infective endocarditis, pneumonia, septic PE, multiple levels of spinal  discitis/ostemyelitis- continue current interventions -Warren was unable to discuss GOC during our phone call again today- she is planning on visiting hospital tomorrow and will call me with her planned time of visit Code Status:   Code Status: Full Code   Prognosis:  Unable to determine  Discharge Planning: To Be Determined    Thank you for allowing the Palliative Medicine Team to assist in the care of this patient.  Total time: 30 mins Prolonged billing:  Time includes:   Preparing to see the patient (e.g., review of tests) Obtaining and/or reviewing separately obtained history Performing a medically necessary appropriate examination and/or evaluation Counseling and educating the patient/family/caregiver Ordering medications, tests, or procedures Referring and communicating with other health care professionals (when not reported separately) Documenting clinical information in the electronic or other health record Independently interpreting results (not reported separately) and communicating results to the patient/family/caregiver Care coordination (not reported separately) Clinical documentation  Cassondra Stain, AGNP-C Palliative Medicine   Please contact Palliative Medicine Team phone at [phone] for questions and concerns.

## 2024-05-01 NOTE — Progress Notes (Signed)
 eLink Physician-Brief Progress Note Patient Name: [name] [name] DOB: 1976/04/09 MRN: [id number]   Date of Service  05/01/2024  HPI/Events of Note  Hg 6.8, no active bleeding  eICU Interventions  Transfuse 1u pRBC     Intervention Category Intermediate Interventions: Bleeding - evaluation and treatment with blood products  Muad Noga 05/01/2024, 4:37 AM

## 2024-05-01 NOTE — Progress Notes (Signed)
 Nephrology Consult   Requesting provider: Jennet Epley Service requesting consult: CCM Reason for consult: AKI   Assessment/Recommendations: [name] [name] is a/an 48 y.o. male with a past medical history substance use disorder who present w/ TV endocarditis c/b altered mental status, acute hypoxic respiratory failure, AKI  Nonoliguric AKI: Baseline creatinine normal.  Creatinine up to 3.2 today.  Likely tubular injury related to hemodynamic shifts and changes associated with this acute illness.  Most likely some ATN.  Glomerular disease associated with infection is possible.  Interstitial nephritis also possible but Zosyn recently stopped which would be the most common medication to cause this -Previous urinalysis with large blood but this was likely related to Foley catheter.  However repeat urinalysis and consider urine sediment evaluation -Had some obstruction and Foley catheter now placed -Continue to monitor daily Cr, Dose meds for GFR -Monitor Daily I/Os, Daily weight  -Maintain MAP>65 for optimal renal perfusion.  -Avoid nephrotoxic medications including NSAIDs -Use synthetic opioids (Fentanyl /Dilaudid ) if needed -Currently no indication for HD  MSSA tricuspid valve endocarditis: With several areas of metastatic infection.  Continue antibiotics per ID recommendations  Acute hypoxic and hypercarbic respiratory failure: ARDS, septic emboli, altered mental status all contributing.  Ventilation per primary team  Hypotension: Associated with sedating medications to some degree.  Utilize pressors as needed     Recommendations conveyed to primary service.    [facility] 05/01/2024 1:19 PM   _____________________________________________________________________________________ CC: AMS  History of Present Illness: [name] [name] is a/an 48 y.o. male with a past medical history of Substance use disorder who presents with altered mental  status.  Patient presented to the hospital about 16 days ago with altered mental status and was covered in feces at the time.  Was unable to protect his airway and was intubated.  He was ultimately found to have septic pulmonary emboli and tricuspid valve endocarditis.  He was able to be extubated earlier in his hospital stay but developed recurrent shortness of breath and required reintubation and had a brief cardiac arrest on 11/3.  An echocardiogram on the same date demonstrated ejection fraction of 40 to 45% with global hypokinesis as well as evidence of tricuspid valve endocarditis.  He was found to have multiple areas of septic emboli on imaging in the past.  He was able to leave the ICU on 11/10 but clinically worsened on 11/13 with significant shortness of breath.  Diuresis was attempted but ultimately the patient continued to worsen and was taken back to the ICU on 11/16 intubated for a third time and required pressors at that time.  Urine output has been intermittently low.  Baseline creatinine is normal and creatinine at this time is up to 3.2.  He was given Lasix with some response   Medications:  Current Facility-Administered Medications  Medication Dose Route Frequency Provider Last Rate Last Admin   acetaminophen  (TYLENOL ) tablet 650 mg  650 mg Oral Q6H PRN Marshall, Jessica, DO   650 mg at 04/27/24 1952   ALPRAZolam  (XANAX ) tablet 0.5 mg  0.5 mg Per Tube Q8H Ilah Krabbe M, PA-C   0.5 mg at 05/01/24 1247   ceFAZolin  (ANCEF ) IVPB 2g/100 mL premix  2 g Intravenous Q8H Calone, Gregory D, FNP 200 mL/hr at 05/01/24 1253 2 g at 05/01/24 1253   Chlorhexidine Gluconate Cloth 2 % PADS 6 each  6 each Topical Daily Shelah Lamar RAMAN, MD   6 each at 05/01/24 0907   docusate (COLACE) 50 MG/5ML  liquid 100 mg  100 mg Oral BID PRN Sharie Bourbon, MD   100 mg at 04/27/24 2226   feeding supplement (OSMOLITE 1.5 CAL) liquid 1,000 mL  1,000 mL Per Tube Continuous Olalere, Adewale A, MD 55 mL/hr at  05/01/24 1000 Infusion Verify at 05/01/24 1000   feeding supplement (PROSource TF20) liquid 60 mL  60 mL Per Tube Daily Jenna Maude BRAVO, NP   60 mL at 05/01/24 0908   fentaNYL  (SUBLIMAZE ) injection 50 mcg  50 mcg Intravenous Q15 min PRN Babcock, Peter E, NP       fentaNYL  (SUBLIMAZE ) injection 50-200 mcg  50-200 mcg Intravenous Q30 min PRN Babcock, Peter E, NP   50 mcg at 04/30/24 0118   folic acid  (FOLVITE ) tablet 1 mg  1 mg Per Tube Daily Babcock, Peter E, NP   1 mg at 05/01/24 9092   free water 200 mL  200 mL Per Tube Q6H Jenna Maude BRAVO, NP   200 mL at 05/01/24 1247   gabapentin (NEURONTIN) 250 MG/5ML solution 100 mg  100 mg Per Tube Q12H Babcock, Peter E, NP   100 mg at 05/01/24 0907   guaiFENesin  (ROBITUSSIN) 100 MG/5ML liquid 5 mL  5 mL Per Tube Q4H Jenna Maude BRAVO, NP   5 mL at 05/01/24 1247   ipratropium-albuterol  (DUONEB) 0.5-2.5 (3) MG/3ML nebulizer solution 3 mL  3 mL Nebulization Q4H PRN Paliwal, Aditya, MD   3 mL at 04/27/24 2248   lidocaine  (LIDODERM ) 5 % 1 patch  1 patch Transdermal Q24H Hongalgi, Anand D, MD   1 patch at 05/01/24 1248   multivitamin with minerals tablet 1 tablet  1 tablet Per Tube Daily Babcock, Peter E, NP   1 tablet at 05/01/24 0908   norepinephrine (LEVOPHED) 16 mg in 250mL (0.064 mg/mL) premix infusion  0-40 mcg/min Intravenous Titrated Babcock, Peter E, NP   Stopped at 04/29/24 1022   ondansetron  (ZOFRAN ) injection 4 mg  4 mg Intravenous Q6H PRN Smith, Joshua C, NP       Oral care mouth rinse  15 mL Mouth Rinse PRN Sharie Bourbon, MD       Oral care mouth rinse  15 mL Mouth Rinse Q2H Jenna Maude BRAVO, NP   15 mL at 05/01/24 1248   Oral care mouth rinse  15 mL Mouth Rinse PRN Babcock, Peter E, NP       oxyCODONE  (Oxy IR/ROXICODONE ) immediate release tablet 5 mg  5 mg Per Tube Q6H Babcock, Peter E, NP   5 mg at 05/01/24 1247   polyethylene glycol (MIRALAX  / GLYCOLAX ) packet 17 g  17 g Per Tube Daily PRN Babcock, Peter E, NP   17 g at 04/30/24 1041    propofol  (DIPRIVAN ) 1000 MG/100ML infusion  0-80 mcg/kg/min Intravenous Continuous Jenna Maude BRAVO, NP 8.34 mL/hr at 05/01/24 1000 20 mcg/kg/min at 05/01/24 1000   sodium chloride  HYPERTONIC 3 % nebulizer solution 4 mL  4 mL Nebulization BID Ilah Krabbe M, PA-C   4 mL at 05/01/24 0746   vasopressin (PITRESSIN) 20 Units in 100 mL (0.2 unit/mL) infusion-*FOR SHOCK*  0-0.03 Units/min Intravenous Continuous Jenna Maude BRAVO, NP   Stopped at 04/28/24 1559     ALLERGIES Patient has no known allergies.  MEDICAL HISTORY Past Medical History:  Diagnosis Date   Anxiety    Back pain    Fracture of rib of right side 01/26/2015   Hypertension      SOCIAL HISTORY Social History   Socioeconomic History  Marital status: Single    Spouse name: Not on file   Number of children: Not on file   Years of education: Not on file   Highest education level: Not on file  Occupational History   Not on file  Tobacco Use   Smoking status: Every Day    Current packs/day: 0.50    Types: Cigarettes   Smokeless tobacco: Never  Substance and Sexual Activity   Alcohol use: No    Comment: rare   Drug use: Yes    Types: Heroin    Comment: reports history of cocaine abuse.-last used years ago   Sexual activity: Yes    Birth control/protection: Condom  Other Topics Concern   Not on file  Social History Narrative   Not on file   Social Drivers of Health   Financial Resource Strain: Not on file  Food Insecurity: No Food Insecurity (03/15/2024)   Hunger Vital Sign    Worried About Running Out of Food in the Last Year: Never true    Ran Out of Food in the Last Year: Never true  Transportation Needs: No Transportation Needs (03/15/2024)   PRAPARE - Administrator, Civil Service (Medical): No    Lack of Transportation (Non-Medical): No  Physical Activity: Not on file  Stress: Not on file  Social Connections: Not on file  Intimate Partner Violence: Patient Unable To Answer (03/15/2024)    Humiliation, Afraid, Rape, and Kick questionnaire    Fear of Current or Ex-Partner: Patient unable to answer    Emotionally Abused: Patient unable to answer    Physically Abused: Patient unable to answer    Sexually Abused: Patient unable to answer     FAMILY HISTORY No family history on file.  Unable to obtain due to the patients AMS   Review of Systems: 12 systems reviewed Unable to obtain due to the patients AMS  Physical Exam: Vitals:   05/01/24 1000 05/01/24 1100  BP: (!) 137/92   Pulse: 94   Resp: (!) 23   Temp:  99.8 F (37.7 C)  SpO2: 100%    Total I/O In: 733.3 [I.V.:21.1; Blood:315; NG/GT:365; IV Piggyback:32.2] Out: -   Intake/Output Summary (Last 24 hours) at 05/01/2024 1319 Last data filed at 05/01/2024 1247 Gross per 24 hour  Intake 2715.53 ml  Output 1055 ml  Net 1660.53 ml   General: Ill appearing, no acute distress HEENT: anicteric sclera, oropharynx clear without lesions CV: Normal rate, no rub, trace LEE Lungs: coarse upper airway sounds, normal work of breathing Abd: soft, non-tender, non-distended Skin: no visible lesions or rashes Psych: alert, not interactive Musculoskeletal: thin arms and legs, otherwise no obvious deformities Neuro: tracks with eyes, unable to communicate  Test Results Reviewed Lab Results  Component Value Date   NA 139 05/01/2024   K 3.9 05/01/2024   CL 99 05/01/2024   CO2 24 05/01/2024   BUN 92 (H) 05/01/2024   CREATININE 3.23 (H) 05/01/2024   CALCIUM 7.9 (L) 05/01/2024   ALBUMIN 2.0 (L) 04/27/2024   PHOS 6.9 (H) 05/01/2024    CBC Recent Labs  Lab 04/30/24 0620 04/30/24 0725 04/30/24 1422 05/01/24 0306  WBC 6.4 6.9  --  7.6  HGB 5.8* 6.0* 7.0* 6.8*  HCT 19.2* 19.8* 22.0* 21.8*  MCV 98.0 97.1  --  95.2  PLT 105* 97*  --  101*    I have reviewed all relevant outside healthcare records related to the patient's current hospitalization

## 2024-05-01 NOTE — Evaluation (Addendum)
 Physical Therapy Re-Evaluation Patient Details Name: [name] [name] MRN: [id number] DOB: 1975-11-13 Today's Date: 05/01/2024  History of Present Illness  48 yo M recently admitted with MSSA PNA MSSA bacteremia psoas abscesses and lumbar osteo 03/14/24-03/25/24 (left AMA) who presented to [facility] ED 04/14/24 w AMS. Admitted with sepsis and transferred to [facility]. Arrived to ED covered in feces, altered, with poor airway protection and was intubated, Had PEA cardiac arrest on 11/3 with 4 mins CPR extubated on 11/9. 11/13  MRI this admission shows septic arthritis at skull base, T5-T6 osteomyelitis/discitis.neurosurgery recommending non op tx of L spine and miami J collar for C spine. Last intubation was 11/16. Course complicated by PNA. PMH includes: anxiety, HTN  Clinical Impression  Pt remains intubated, no longer on pressors, and only light sedation. Pt rouses with increased stimulation and recognizes PT/OT from prior ICU admission. Pt only able to participate in minor UE AAROM and requires PROM for bilateral foot and knee mobility. PT and OT worked to reduce distal edema with PROM of ankles and wrist and hand as well as soft tissue mobilization as well as more advantageous positioning of UE/LE for fluid return. Pt with increased secretions and coughing during session requiring suction by RT creating increased fatigue.  Pt situation is tenuous as he has not been able to successfully wean from vent. On positive note pt able to raise R hand to wave goodbye to therapists. Pt will likely need PT/OT at [facility] at discharge. Goals have been reviewed and downgraded given pts reintubation. PT will continue to follow acutely.         If plan is discharge home, recommend the following: Assist for transportation;Help with stairs or ramp for entrance;A lot of help with bathing/dressing/bathroom;Assistance with cooking/housework;Direct supervision/assist for medications management;Direct supervision/assist for financial  management;Two people to help with walking and/or transfers   Can travel by private vehicle   No    Equipment Recommendations None recommended by PT     Functional Status Assessment Patient has had a recent decline in their functional status and demonstrates the ability to make significant improvements in function in a reasonable and predictable amount of time.     Precautions / Restrictions Precautions Precautions: Fall;Cervical;Back Precaution Booklet Issued: No Precaution/Restrictions Comments: ETT, Cortrak, B wrist restraints Required Braces or Orthoses: Cervical Brace Cervical Brace: Hard collar (can don at EOB, can be removed to walk to the bathroom (per active orders)) Restrictions Weight Bearing Restrictions Per Provider Order: No      Mobility  Bed Mobility Overal bed mobility: Needs Assistance Bed Mobility: Rolling Rolling: Max assist         General bed mobility comments: brief rolling to L side for cervical repositioning to achieve neutral neck position (pt tending to laterally flex neck to the R away from vent)    Transfers                   General transfer comment: NT 2/2 pt on light sedation           Pertinent Vitals/Pain Pain Assessment Pain Assessment: Faces Faces Pain Scale: Hurts even more Pain Location: neck with movement Pain Descriptors / Indicators: Discomfort, Grimacing Pain Intervention(s): Limited activity within patient's tolerance, Monitored during session, Repositioned    Home Living Family/patient expects to be discharged to:: Private residence Living Arrangements: Spouse/significant other Available Help at Discharge: Family;Available 24 hours/day Type of Home: House Home Access: Stairs to enter Entrance Stairs-Rails: None Entrance Stairs-Number of Steps: half step into  house   Home Layout: One level Home Equipment: Agricultural Consultant (2 wheels);Wheelchair - manual;BSC/3in1;Cane - single point;Tub bench      Prior  Function Prior Level of Function : Independent/Modified Independent;Driving             Mobility Comments: 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 ADLs Comments: Independent     Extremity/Trunk Assessment   Upper Extremity Assessment Upper Extremity Assessment: Defer to OT evaluation RUE Deficits / Details: +2 edema distal>proximal; generally deconditioned LUE Deficits / Details: +2 edema distal>proximal; generally deconditioned    Lower Extremity Assessment Lower Extremity Assessment: RLE deficits/detail;LLE deficits/detail;Generalized weakness RLE Deficits / Details: +2 pitting edema distally> proximally,  pt with increased plantarflexion at rest, LLE Deficits / Details: +2 pitting edema distally> proximally, pt with increased plantarflexion at rest,    Cervical / Trunk Assessment Cervical / Trunk Assessment: Kyphotic  Communication   Communication Communication: Impaired    Cognition Arousal: Lethargic (on mild propofol  gtt) Behavior During Therapy: WFL for tasks assessed/performed                             Following commands: Impaired Following commands impaired: Follows one step commands with increased time, Follows one step commands inconsistently (likely medication related)     Cueing Cueing Techniques: Verbal cues, Tactile cues     General Comments General comments (skin integrity, edema, etc.): +2 pitting edema distally> proximally, pt with increased plantarflexion at rest,    Exercises General Exercises - Lower Extremity Ankle Circles/Pumps: PROM, Both (with soft tissue mobilization for edema reduction)   Assessment/Plan    PT Assessment Patient needs continued PT services  PT Problem List Decreased strength;Decreased activity tolerance;Decreased balance;Decreased mobility       PT Treatment Interventions DME instruction;Gait training;Stair training;Functional mobility training;Therapeutic activities;Therapeutic  exercise;Balance training;Patient/family education    PT Goals (Current goals can be found in the Care Plan section)  Acute Rehab PT Goals PT Goal Formulation: Patient unable to participate in goal setting Time For Goal Achievement: 05/09/24 Potential to Achieve Goals: Poor    Frequency Min 2X/week     Co-evaluation PT/OT/SLP Co-Evaluation/Treatment: Yes Reason for Co-Treatment: Complexity of the patient's impairments (multi-system involvement);To address functional/ADL transfers PT goals addressed during session: Strengthening/ROM OT goals addressed during session: ADL's and self-care       AM-PAC PT 6 Clicks Mobility  Outcome Measure Help needed turning from your back to your side while in a flat bed without using bedrails?: Total Help needed moving from lying on your back to sitting on the side of a flat bed without using bedrails?: Total Help needed moving to and from a bed to a chair (including a wheelchair)?: Total Help needed standing up from a chair using your arms (e.g., wheelchair or bedside chair)?: Total Help needed to walk in hospital room?: Total Help needed climbing 3-5 steps with a railing? : Total 6 Click Score: 6    End of Session Equipment Utilized During Treatment: Oxygen Activity Tolerance: Treatment limited secondary to medical complications (Comment) (intubation and light sedaton) Patient left: in bed;with call bell/phone within reach;with bed alarm set;with restraints reapplied;Other (comment) Nurse Communication: Mobility status;Other (comment) (Need for Prevalon boots to protect heels from pressure injury) PT Visit Diagnosis: Unsteadiness on feet (R26.81);Muscle weakness (generalized) (M62.81);Difficulty in walking, not elsewhere classified (R26.2)    Time: 8489-8455 PT Time Calculation (min) (ACUTE ONLY): 34 min   Charges:   PT  Evaluation $PT Re-evaluation: 1 Re-eval   PT General Charges $$ ACUTE PT VISIT: 1 Visit         Tonda Wiederhold B.  Fleeta Lapidus PT, DPT Acute Rehabilitation Services Please use secure chat or  Call Office [phone]   [name] 05/01/2024, 6:50 PM

## 2024-05-01 NOTE — Plan of Care (Signed)

## 2024-05-01 NOTE — Evaluation (Signed)
 Occupational Therapy Re-Evaluation Patient Details Name: [name] [name] MRN: [id number] DOB: 07/28/1975 Today's Date: 05/01/2024   History of Present Illness   48 yo M recently admitted with MSSA PNA MSSA bacteremia psoas abscesses and lumbar osteo 03/14/24-03/25/24 (left AMA) who presented to [facility] ED 04/14/24 w AMS. Admitted with sepsis and transferred to [facility]. Arrived to ED covered in feces, altered, with poor airway protection and was intubated, extubated on 11/9. 11/13 neurosurgery recommending non op tx of L spine and miami J collar for C spine. Course complicated by PNA. PMH includes: anxiety, HTN     Clinical Impressions Pt received in bed in ICU, lightly sedated on propofol  gtt. Awakens to stim but unable to maintain wakefulness for very long. He presents today generally deconditioned, in B wrist restraints, +2 edema to B hands/forearms, and intubated on vent. OT re-evaluation following re-intubation on 11/16 and need to downgrade goals in OT POC to better reflect pt's current functional status. Pt currently requiring total care for all ADLs (also may be impacted by sedation), needs max A for brief rolling for improved cervical alignment. OT adjusted Aspen collar with improved fit achieved. Pt tolerated brief bed level UE exercises and positioning for contracture prevention and pressure relief. Stable vitals throughout on 40% FiO2 / PEEP 5.   Pt is currently functioning well below his baseline, OT to continue to follow.     If plan is discharge home, recommend the following:   Two people to help with walking and/or transfers;Two people to help with bathing/dressing/bathroom;Direct supervision/assist for medications management;Direct supervision/assist for financial management;Assist for transportation;Help with stairs or ramp for entrance     Functional Status Assessment   Patient has had a recent decline in their functional status and demonstrates the ability to make significant  improvements in function in a reasonable and predictable amount of time.     Equipment Recommendations   Other (comment) (defer)     Recommendations for Other Services         Precautions/Restrictions   Precautions Precautions: Fall;Cervical;Back Precaution Booklet Issued: No Precaution/Restrictions Comments: ETT, Cortrak, B wrist restraints Required Braces or Orthoses: Cervical Brace Cervical Brace: Hard collar (can don at EOB, can be removed to walk to the bathroom (per active orders)) Restrictions Weight Bearing Restrictions Per Provider Order: No     Mobility Bed Mobility Overal bed mobility: Needs Assistance Bed Mobility: Rolling Rolling: Max assist         General bed mobility comments: brief rolling to L side for cervical repositioning to achieve neutral neck position (pt tending to laterally flex neck to the R away from vent)    Transfers                   General transfer comment: NT 2/2 pt on light sedation      Balance                                           ADL either performed or assessed with clinical judgement   ADL Overall ADL's : Needs assistance/impaired Eating/Feeding: NPO (Cortrak via OGT)   Grooming: Total assistance;Bed level;Wash/dry face Grooming Details (indicate cue type and reason): oral suction via yankauer Upper Body Bathing: Total assistance   Lower Body Bathing: Maximal assistance   Upper Body Dressing : Maximal assistance   Lower Body Dressing: Total assistance  Vision         Perception         Praxis         Pertinent Vitals/Pain Pain Assessment Pain Assessment: Faces Faces Pain Scale: Hurts even more Pain Location: neck with movement Pain Descriptors / Indicators: Discomfort, Grimacing     Extremity/Trunk Assessment Upper Extremity Assessment Upper Extremity Assessment: Left hand dominant;Generalized weakness RUE Deficits / Details: +2  edema distal>proximal; generally deconditioned LUE Deficits / Details: +2 edema distal>proximal; generally deconditioned           Communication Communication Communication: Impaired   Cognition Arousal: Lethargic (on mild propofol  gtt) Behavior During Therapy: WFL for tasks assessed/performed Cognition: Cognition impaired             OT - Cognition Comments: cognition likely affected by propofol  gtt today, delayed command following, answers inconsistently when stimulated                 Following commands: Impaired Following commands impaired: Follows one step commands with increased time, Follows one step commands inconsistently (likely medication related)     Cueing  General Comments   Cueing Techniques: Verbal cues;Tactile cues  FiO2 40% / PEEP 5 with stable vitals; off pressors; vent alarming intermittently when pt coughing requiring RRT to come in to provide deep & oral suctioning   Exercises Exercises: General Upper Extremity General Exercises - Upper Extremity Shoulder Flexion: AAROM, PROM, Strengthening, Right, Left, 10 reps, Supine Elbow Flexion: PROM, AAROM, Strengthening, Both, 10 reps, Supine Elbow Extension: PROM, AAROM, Strengthening, Both, 10 reps, Supine Digit Composite Flexion: PROM, Both, 10 reps, Supine (hand pumping and soft tissue mobs to improve edema in B hands/forearms)   Shoulder Instructions      Home Living Family/patient expects to be discharged to:: Private residence Living Arrangements: Spouse/significant other Available Help at Discharge: Family;Available 24 hours/day Type of Home: House Home Access: Stairs to enter [employer] of Steps: half step into house Entrance Stairs-Rails: None Home Layout: One level     Bathroom Shower/Tub: Chief Strategy Officer: Standard Bathroom Accessibility: Yes How Accessible: Accessible via wheelchair;Accessible via walker Home Equipment: Rolling Walker (2  wheels);Wheelchair - manual;BSC/3in1;Cane - single point;Tub bench          Prior Functioning/Environment Prior Level of Function : Independent/Modified Independent;Driving             Mobility Comments: 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 ADLs Comments: Independent    OT Problem List: Decreased strength;Decreased range of motion;Decreased activity tolerance;Impaired balance (sitting and/or standing);Decreased cognition;Decreased safety awareness;Pain;Decreased coordination;Increased edema   OT Treatment/Interventions: Self-care/ADL training;Therapeutic exercise;DME and/or AE instruction;Therapeutic activities;Cognitive remediation/compensation;Patient/family education;Balance training      OT Goals(Current goals can be found in the care plan section)   Acute Rehab OT Goals Patient Stated Goal: pt did not state 2/2 lightly sedated on propfol gtt OT Goal Formulation: Patient unable to participate in goal setting (2/2 sedation/lethargy) Time For Goal Achievement: 05/15/24 Potential to Achieve Goals: Fair   OT Frequency:  Min 2X/week    Co-evaluation PT/OT/SLP Co-Evaluation/Treatment: Yes Reason for Co-Treatment: Complexity of the patient's impairments (multi-system involvement);To address functional/ADL transfers   OT goals addressed during session: ADL's and self-care      AM-PAC OT 6 Clicks Daily Activity     Outcome Measure Help from another person eating meals?: Total Help from another person taking care of personal grooming?: Total Help from another person toileting, which includes using toliet, bedpan,  or urinal?: Total Help from another person bathing (including washing, rinsing, drying)?: Total Help from another person to put on and taking off regular upper body clothing?: Total Help from another person to put on and taking off regular lower body clothing?: Total 6 Click Score: 6   End of Session Equipment Utilized During Treatment:  Oxygen;Cervical collar Nurse Communication: Mobility status  Activity Tolerance: Patient limited by fatigue;Patient limited by lethargy Patient left: in bed;with call bell/phone within reach;with bed alarm set;with restraints reapplied (B mitts & wrist restraints re-applied)  OT Visit Diagnosis: Unsteadiness on feet (R26.81);Muscle weakness (generalized) (M62.81);Pain Pain - part of body:  (neck/back)                Time: 8487-8460 OT Time Calculation (min): 27 min Charges:  OT General Charges $OT Visit: 1 Visit OT Evaluation $OT Re-eval: 1 Re-eval  Eliah Marquard M. [place], OTR/L [facility] Acute Rehabilitation Services [phone] Secure Chat Preferred  Gurinder Toral 05/01/2024, 4:44 PM

## 2024-05-02 ENCOUNTER — Inpatient Hospital Stay (HOSPITAL_COMMUNITY): Payer: MEDICAID

## 2024-05-02 LAB — BASIC METABOLIC PANEL WITH GFR
Anion gap: 12 (ref 5–15)
BUN: 97 mg/dL — ABNORMAL HIGH (ref 6–20)
CO2: 25 mmol/L (ref 22–32)
Calcium: 7.8 mg/dL — ABNORMAL LOW (ref 8.9–10.3)
Chloride: 98 mmol/L (ref 98–111)
Creatinine, Ser: 3.24 mg/dL — ABNORMAL HIGH (ref 0.61–1.24)
GFR, Estimated: 23 mL/min — ABNORMAL LOW (ref >60)
Glucose, Bld: 126 mg/dL — ABNORMAL HIGH (ref 70–99)
Potassium: 4.1 mmol/L (ref 3.5–5.1)
Sodium: 135 mmol/L (ref 135–145)

## 2024-05-02 LAB — TYPE AND SCREEN
ABO/RH(D): A POS
Antibody Screen: NEGATIVE
Unit division: 0
Unit division: 0

## 2024-05-02 LAB — GLUCOSE, CAPILLARY
Glucose-Capillary: 129 mg/dL — ABNORMAL HIGH (ref 70–99)
Glucose-Capillary: 91 mg/dL (ref 70–99)
Glucose-Capillary: 107 mg/dL — ABNORMAL HIGH (ref 70–99)
Glucose-Capillary: 125 mg/dL — ABNORMAL HIGH (ref 70–99)
Glucose-Capillary: 134 mg/dL — ABNORMAL HIGH (ref 70–99)

## 2024-05-02 LAB — BPAM RBC
Blood Product Expiration Date: 202511252359
Blood Product Expiration Date: 202512122359
ISSUE DATE / TIME: 202511181013
ISSUE DATE / TIME: 202511190503
Unit Type and Rh: 6200
Unit Type and Rh: 6200

## 2024-05-02 LAB — CBC
HCT: 25 % — ABNORMAL LOW (ref 39.0–52.0)
Hemoglobin: 7.9 g/dL — ABNORMAL LOW (ref 13.0–17.0)
MCH: 28.7 pg (ref 26.0–34.0)
MCHC: 31.6 g/dL (ref 30.0–36.0)
MCV: 90.9 fL (ref 80.0–100.0)
Platelets: 107 K/uL — ABNORMAL LOW (ref 150–400)
RBC: 2.75 MIL/uL — ABNORMAL LOW (ref 4.22–5.81)
RDW: 19.4 % — ABNORMAL HIGH (ref 11.5–15.5)
WBC: 7.1 K/uL (ref 4.0–10.5)
nRBC: 0 % (ref 0.0–0.2)

## 2024-05-02 LAB — TRIGLYCERIDES: Triglycerides: 89 mg/dL (ref <150)

## 2024-05-02 MED ORDER — DEXMEDETOMIDINE HCL IN NACL 400 MCG/100ML IV SOLN: 0.0000 ug/kg/h | INTRAVENOUS | Status: DC

## 2024-05-02 MED ORDER — MIDODRINE HCL 5 MG PO TABS
5.0000 mg | ORAL_TABLET | Freq: Three times a day (TID) | ORAL | Status: DC
  Administered 2024-05-02 – 2024-05-14 (×36): 5 mg
  Filled 2024-05-02 (×35): qty 1

## 2024-05-02 MED ORDER — HEPARIN SODIUM (PORCINE) 5000 UNIT/ML IJ SOLN
5000.0000 [IU] | Freq: Three times a day (TID) | INTRAMUSCULAR | Status: DC
  Administered 2024-05-02 – 2024-05-18 (×48): 5000 [IU] via SUBCUTANEOUS
  Filled 2024-05-02 (×49): qty 1

## 2024-05-02 MED ORDER — DEXMEDETOMIDINE HCL IN NACL 400 MCG/100ML IV SOLN
0.0000 ug/kg/h | INTRAVENOUS | Status: DC
  Administered 2024-05-02: 0.3 ug/kg/h via INTRAVENOUS
  Administered 2024-05-02: 0.4 ug/kg/h via INTRAVENOUS
  Administered 2024-05-03 (×2): 0.5 ug/kg/h via INTRAVENOUS
  Administered 2024-05-04: 0.4 ug/kg/h via INTRAVENOUS
  Administered 2024-05-04: 0.5 ug/kg/h via INTRAVENOUS
  Administered 2024-05-04: 0.8 ug/kg/h via INTRAVENOUS
  Administered 2024-05-04: 0.7 ug/kg/h via INTRAVENOUS
  Administered 2024-05-05: 0.8 ug/kg/h via INTRAVENOUS
  Administered 2024-05-05: 0.6 ug/kg/h via INTRAVENOUS
  Administered 2024-05-05: 0.8 ug/kg/h via INTRAVENOUS
  Administered 2024-05-06: 0.5 ug/kg/h via INTRAVENOUS
  Filled 2024-05-02 (×11): qty 100

## 2024-05-02 NOTE — Progress Notes (Signed)
 Daily Progress Note   Patient Name: [name] [name]       Date: 05/02/2024 DOB: Apr 29, 1976  Age: 48 y.o. MRN#: [id number] Attending Physician: Neda Jennet LABOR, MD Primary Care Physician: Bertell Satterfield, MD Admit Date: 04/14/2024  Reason for Consultation/Follow-up:  Establishing goals of care  Patient Profile/HPI: 48 y.o. male  with past medical history of substance abuse, recent hospitalization for sepsis r/t MSSA bacteremia with septic pneumonia and PE which he left AMA without completing recommended antibiotics, lumbar discitis and septic facet joint admitted on 04/14/2024 with sepsis d/t MSSA bacteremia. 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 He has been intubated 3 times. Had PEA cardiac arrest on 11/3 with 4 mins CPR.  Last intubation was 11/16. He has severe back pain. MRI this admission shows septic arthritis at skill base, T5-T6 osteomyelitis/discitis.  He is currently intubated. Levophed turned off 11/17. Sedated with propofol . Palliative medicine consulted for assistance with medical decision making.   Subjective: Chart reviewed including labs, progress notes, imaging from this and previous encounters.  Patient is not tolerating wean from vent. Discussed with bedside RN.  Noted he had bronchoscopy today with washout. Had obstructing mucoid obstructions. PCCM continues to recommend trach if full scope desired.  Cr 3.24- nephrology note reviewed- most likely ATN vs glomerular disease d/t infection.  Currently sedation is lifted and per RN he has been able to communicate some.  I met with patient's spouse Warren in separate room.  She and Gregg have been married  for 18 years- have 35 yr old son.  He enjoys staying at home and watching [employer] with her.  He was not working prior to admission.  I reviewed Comer's acute and chronic issues with [name]. Extensive discussion was had regarding his multiple critical illnesses.  Discussed placement of tracheostomy, PEG.  Option for extubation without reintubation also discussed.  Option for comfort measures reviewed. Discussed transition to comfort measures only which includes stopping IV fluids, antibiotics, labs and providing symptom management for SOB, anxiety, nausea, vomiting, and other symptoms of dying.  We discussed that patient would likely require nursing home care with long rehabilitation. He is at risk of paralysis due to the infections in his spine. It is possible he will  be disabled long term IF he is able to recover from this critical illness. He is at risk of future infections as well with long term decline.  Warren was understandably very emotional during our discussion. She has had endocarditis herself and spent time in a nursing facility and it was not a good experience for her.  She notes that Shonta would likely be ok with short term in a nursing home- however, long term her goal would be for him to return home where she would be able to provide care for him. She understands this would include intensive personal care with his ADLs- IF he is able to liberate from ventilator.  We discussed code status. Warren confirmed full code status.  We returned to bedside and I assessed patient's ability to participate in discussion.  Patient was able to nod yes, but shaking his head no was difficult for him.  He was able to reliably answer yes/no questions. He was able to perform thumbs up and thumbs down for yes and no. Reliability was tested. Orientation was tested. I reviewed his medical status with him.  I discussed his ventilator dependence.  I reviewed option for tracheostomy/PEG vs extubation without  reintubation and comfort if fails. We discussed likelihood of long term debility and need for nursing facility. We discussed that he may or may not be able to come off vent and to have trach removed.  Patient's spouse and RN remained at bedside.  Patient was able to confirm that he would agree with trach and PEG for now with thumbs up when asked.   ROS   Physical Exam Vitals and nursing note reviewed.  Cardiovascular:     Rate and Rhythm: Tachycardia present.  Pulmonary:     Comments: intubated Skin:    General: Skin is warm and dry.  Neurological:     Mental Status: He is alert and oriented to person, place, and time.             Vital Signs: BP 122/84   Pulse 93   Temp 98.2 F (36.8 C) (Axillary)   Resp (!) 24   Ht 6' (1.829 m)   Wt 72.6 kg   SpO2 100%   BMI 21.71 kg/m  SpO2: SpO2: 100 % O2 Device: O2 Device: Ventilator O2 Flow Rate: O2 Flow Rate (L/min): 5 L/min  Intake/output summary:  Intake/Output Summary (Last 24 hours) at 05/02/2024 1330 Last data filed at 05/02/2024 1200 Gross per 24 hour  Intake 2480.83 ml  Output 1150 ml  Net 1330.83 ml   LBM: Last BM Date : 05/02/24 Baseline Weight: Weight: 62.5 kg Most recent weight: Weight: 72.6 kg       Palliative Assessment/Data: PPS: 20%      Patient Active Problem List   Diagnosis Date Noted   Psoas abscess (HCC) 04/30/2024   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   Acute septic pulmonary embolism without acute cor pulmonale (HCC) 04/30/2024   Sepsis (HCC) 04/14/2024   Severe sepsis (HCC) 04/14/2024   Protein-calorie malnutrition, severe 03/22/2024   Septic embolism (HCC) 03/15/2024   Pressure injury of skin 03/15/2024   Lactic acidosis 03/15/2024   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   Thrombocytopenia 03/15/2024   Transaminasemia 03/15/2024   Hypoalbuminemia 03/15/2024   Failure to thrive in adult 03/15/2024   Sepsis due to pneumonia (HCC) 11/07/2016   Leukopenia 11/07/2016   Acute  respiratory failure with hypoxia and hypercapnia (HCC) 11/07/2016   Anxiety disorder 11/07/2016   Hyperbilirubinemia 11/07/2016  Fever 01/18/2014   MSSA bacteremia 01/18/2014   CAP (community acquired pneumonia) 05/19/2013   Sinus tachycardia 05/19/2013   Tobacco abuse 05/19/2013    Palliative Care Assessment & Plan    Assessment/Recommendations/Plan  -respiratory failure in the setting of sepsis MSSA bacteremia, infective endocarditis, pneumonia, septic PE, multiple levels of spinal discitis/ostemyelitis- continue current interventions Continue full scope, full code- ok to proceed with trach/PEG PMT will continue to follow and assist with further GOC discussion with patient as needed SLP consult placed for possible assistance with communication - patient unable to write, ?assistive device such as letter board may be helpful?   Code Status:   Code Status: Full Code   Prognosis:  Unable to determine  Discharge Planning: To Be Determined  Care plan was discussed with patient, family, attending MD and bedside RN.  Thank you for allowing the Palliative Medicine Team to assist in the care of this patient.  Total time:  90 minutes Prolonged billing:  Time includes:   Preparing to see the patient (e.g., review of tests) Obtaining and/or reviewing separately obtained history Performing a medically necessary appropriate examination and/or evaluation Counseling and educating the patient/family/caregiver Ordering medications, tests, or procedures Referring and communicating with other health care professionals (when not reported separately) Documenting clinical information in the electronic or other health record Independently interpreting results (not reported separately) and communicating results to the patient/family/caregiver Care coordination (not reported separately) Clinical documentation  Cassondra Stain, AGNP-C Palliative Medicine   Please contact Palliative Medicine  Team phone at [phone] for questions and concerns.

## 2024-05-02 NOTE — Procedures (Signed)
 Bronchoscopy Procedure Note  RAQUEL SAYRES  [id number]  1976/05/05  Date:05/02/24  Time:11:08 AM   Provider Performing:Shaquille Murdy A Jhovani Griswold   Procedure(s):  Flexible bronchoscopy with bronchial alveolar lavage [phone])  Indication(s) Mucous plugging  Consent Discussed with patient, agreeable with procedure  Anesthesia Received fentanyl , on propofol    Time Out Verified patient identification, verified procedure, site/side was marked, verified correct patient position, special equipment/implants available, medications/allergies/relevant history reviewed, required imaging and test results available.   Sterile Technique Usual hand hygiene, masks, gowns, and gloves were used   Procedure Description Bronchoscope advanced through endotracheal tube and into airway.  Airways were examined down to subsegmental level with findings noted below.   Following diagnostic evaluation, BAL(s) performed in 60 cc with normal saline and return of 30 fluid Mucoid secretions in the airway causing obstruction Findings:  Suctioned secretions from the airway effectively   Complications/Tolerance None; patient tolerated the procedure well. Chest X-ray is not needed post procedure.   EBL none   Specimen(s) Lavage right lower lobe

## 2024-05-02 NOTE — Progress Notes (Signed)
 [facility]  Date of Admission:  04/14/2024     Reason for Follow Up: Disseminated MSSA infection  Total days of antibiotics 18         ASSESSMENT:  [name] is a 48 year old Caucasian gentleman admitted with MSSA pneumonia, bacteremia, tricuspid valve endocarditis, psoas abscess, and lumbar osteomyelitis.  Admitted on 11/2 with altered mental status and inability to protect airway requiring intubation.  Now extubated.  Neck and back imaging with multiple areas of osteomyelitis/discitis and abnormal widening of the atlantodental and basion-dens intervals secondary to acute septic arthritis. No surgical intervention planned.  Completed 5 days of piperacillin/tazobactam for suspected pneumonia and transition back to cefazolin .  [name] was febrile overnight and is status post bronchoscopy with cultures obtained.  CT imaging of chest/abdomen/pelvis is pending.  Will obtain blood cultures. Appears central line placed directly after blood cultures drawn which have remained negative although would recommend limiting lines if not needed. Burden of infection remains a concern. Recommend continuing with current dose of Cefazolin  while awaiting results of CT imaging. Palliative Care following with goals of care discussion. Remaining medical and supportive care per PCCM.   PLAN:  Continue current dose of Cefazolin .  Obtain blood cultures and monitor BAL specimens for organisms.  CT chest/abdomen/pelvis pending. Limit lines as able  Standard/universal precautions. Mechanical ventilation and remaining medical care per PCCM.   Principal Problem:   Sepsis (HCC) Active Problems:   MSSA bacteremia   Acute respiratory failure with hypoxia and hypercapnia (HCC)   Vertebral osteomyelitis (HCC)   Severe sepsis (HCC)   Psoas abscess (HCC)   Endocarditis of tricuspid valve   Acute septic pulmonary embolism without acute cor pulmonale (HCC)    ALPRAZolam   0.5 mg  Per Tube Q8H   Chlorhexidine Gluconate Cloth  6 each Topical Daily   feeding supplement (PROSource TF20)  60 mL Per Tube Daily   folic acid   1 mg Per Tube Daily   free water  200 mL Per Tube Q6H   gabapentin  100 mg Per Tube Q12H   guaiFENesin   5 mL Per Tube Q4H   heparin  injection (subcutaneous)  5,000 Units Subcutaneous Q8H   lidocaine   1 patch Transdermal Q24H   midodrine  5 mg Per Tube TID WC   multivitamin with minerals  1 tablet Per Tube Daily   mouth rinse  15 mL Mouth Rinse Q2H   oxyCODONE   5 mg Per Tube Q6H   sodium chloride  HYPERTONIC  4 mL Nebulization BID    SUBJECTIVE:  Febrile in the last 24 hours Temperature of 101.6 F with no leukocytosis and last white blood cell count of 7100.  Chest x-ray from 05/02/2011 with stable bilateral lung left being greater than the right.  [name] is more alert and able to answer basic questions indicating that his breathing is better since being on the ventilator and showing a so-so response to being asked how he feels.  No new pains noted.  Appears to be tolerating antibiotics with no adverse side effects..  No Known Allergies   Review of Systems: Review of Systems  Unable to perform ROS: Intubated      OBJECTIVE: Vitals:   05/02/24 1230 05/02/24 1300 05/02/24 1330 05/02/24 1400  BP: (!) 130/99 (!) 141/99 (!) 156/95 (!) 154/113  Pulse: (!) 106 (!) 108 (!) 114 (!) 115  Resp: 19 (!) 22 20 17   Temp:      TempSrc:  SpO2: 100% 100% 100% 100%  Weight:      Height:       Body mass index is 21.71 kg/m.  Physical Exam Constitutional:      General: He is not in acute distress.    Appearance: He is well-developed. He is ill-appearing.     Interventions: He is restrained. Cervical collar in place.     Comments: Lying in bed with head of bed elevated.   Cardiovascular:     Rate and Rhythm: Normal rate and regular rhythm.     Heart sounds: Normal heart sounds.  Pulmonary:     Effort: Pulmonary effort is normal.      Breath sounds: Normal breath sounds.  Skin:    General: Skin is warm and dry.  Neurological:     Mental Status: He is alert.     Lab Results Lab Results  Component Value Date   WBC 7.1 05/02/2024   HGB 7.9 (L) 05/02/2024   HCT 25.0 (L) 05/02/2024   MCV 90.9 05/02/2024   PLT 107 (L) 05/02/2024    Lab Results  Component Value Date   CREATININE 3.24 (H) 05/02/2024   BUN 97 (H) 05/02/2024   NA 135 05/02/2024   K 4.1 05/02/2024   CL 98 05/02/2024   CO2 25 05/02/2024    Lab Results  Component Value Date   ALT <5 04/27/2024   AST 14 (L) 04/27/2024   ALKPHOS 109 04/27/2024   BILITOT 1.5 (H) 04/27/2024     Microbiology: Recent Results (from the past 240 hours)  MRSA Next Gen by PCR, Nasal     Status: None   Collection Time: 04/28/24  1:07 PM   Specimen: Nasal Mucosa; Nasal Swab  Result Value Ref Range Status   MRSA by PCR Next Gen NOT DETECTED NOT DETECTED Final    Comment: (NOTE) The GeneXpert MRSA Assay (FDA approved for NASAL specimens only), is one component of a comprehensive MRSA colonization surveillance program. It is not intended to diagnose MRSA infection nor to guide or monitor treatment for MRSA infections. Test performance is not FDA approved in patients less than 8 years old. Performed at [facility], [address], [place], [place] [address]   Culture, Respiratory w Gram Stain     Status: None   Collection Time: 04/28/24  4:02 PM   Specimen: Tracheal Aspirate; Respiratory  Result Value Ref Range Status   Specimen Description TRACHEAL ASPIRATE  Final   Special Requests NONE  Final   Gram Stain   Final    NO WBC SEEN FEW YEAST WITH PSEUDOHYPHAE FEW BUDDING YEAST SEEN Performed at [facility], [address] [place], [place] [address]    Culture   Final    MODERATE CANDIDA TROPICALIS MODERATE CANDIDA ALBICANS    Report Status 04/30/2024 FINAL  Final     Cathlyn July, NP [facility]  05/02/2024  2:05 PM

## 2024-05-02 NOTE — Progress Notes (Signed)
 NAME:  [name] [name], MRN:  [id number], DOB:  14-Feb-1976, LOS: 18 ADMISSION DATE:  04/14/2024, CONSULTATION DATE:  04/14/2024 REFERRING MD:  [place] - [facility] EDP, CHIEF COMPLAINT:  AMS  History of Present Illness:  48 year old man who presented to [facility] ED 11/2 with AMS. PMHx significant for MSSA bacteremia, MSSA PNA, infective endocarditis, cervical through lumbar osteomyelitis, psoas abscess, severe protein calorie malnutrition/FTT, substance abuse. Recent admission 10/2 - 10/13 for sepsis, widespread MSSA.  Arrived to [facility] ED covered in feces, altered and with poor airway protection; he was subsequently intubated. CXR demonstrated RLL opacities. Confirmed bilateral septic pulmonary emboli, tricuspid valve endocarditis.  Required intubation and mechanical ventilation. Clinically progressed and was transferred out of ICU 11/10.  Reconsulted 11/13 for AMS, tachypnea and tachycardia.   Pertinent Medical History:  MSSA bacteremia MSSA PNA Lumbar osteomyelitis  Psoas abscess  Severe protein calorie malnutrition FTT in adult  Significant Hospital Events: Including procedures, antibiotic start and stop dates in addition to other pertinent events   11/2 [facility] ED w AMS. Intubated. Started on vanc mero. Txf request to [place] to [facility]  11/3 Extubated, developed respiratory distress requiring reintubation. Brief cardiac arrest during intubation.  11/3 Transthoracic echocardiogram > LVEF 40-45%, global hypokinesis, grade 1 diastolic dysfunction, normal RV function.  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  No mitral or aortic valve involvement 11/3 CT Chest, abdomen, pelvis >> numerous bilateral cavitary and solid nodules consistent with septic emboli, left lower lobe consolidation with a small pleural effusion, right basilar atelectasis.  Moderate free fluid in the pelvis.  Erosive changes at T5-T6, L2-L3, L4-L5 suggestive of discitis 11/3 CT Head >> no acute abnormality.   Opacified left maxillary sinus 11/10 Transferred out of ICU. 11/13 PCCM reconsulted for decreased mental status, tachycardia, tachypnea. Started diuretics 11/16 Poorly responsive, transferred back to ICU and intubated for the third time. Pressors started post intubation. Renal function worse. UOP poor.   Interim History/Subjective:  No significant events Hgb stable s/p transfusion, 7.9 (8.0) UOP 1.3L/24H, net +1.6L/24H Weaning on vent Awake and following commands Spouse, Warren, coming to hospital today per PMT  Objective:   Blood pressure 97/66, pulse 78, temperature 98 F (36.7 C), temperature source Axillary, resp. rate (!) 24, height 6' (1.829 m), weight 72.6 kg, SpO2 100%.    Vent Mode: PRVC FiO2 (%):  [30 %-40 %] 30 % Set Rate:  [24 bmp] 24 bmp Vt Set:  [470 mL] 470 mL PEEP:  [5 cmH20] 5 cmH20 Pressure Support:  [10 cmH20] 10 cmH20 Plateau Pressure:  [10 cmH20-17 cmH20] 17 cmH20   Intake/Output Summary (Last 24 hours) at 05/02/2024 9271 Last data filed at 05/02/2024 0700 Gross per 24 hour  Intake 2925.37 ml  Output 1300 ml  Net 1625.37 ml   Filed Weights   04/30/24 0702 05/01/24 0717 05/02/24 0311  Weight: 72.6 kg 72.6 kg 72.6 kg   Physical Examination: General: Acutely ill-appearing middle-aged man in NAD. HEENT: [place]/AT, anicteric sclera, PERRL, moist mucous membranes. Neuro: Awake, nodding appropriately to questions, calm. Responds to verbal stimuli. Following commands consistently. Moves all 4 extremities spontaneously. Generalized weakness. +Cough/gag.  CV: Tachycardic to 100s, regular rhythm, no m/g/r. ?Slight click heard over apex. PULM: Breathing even and unlabored on vent (PEEP 5, FiO2 30%). Lung fields diminished at bases, L > R. GI: Soft, nontender, nondistended. Normoactive bowel sounds. Extremities: Bilateral symmetric 1+ LE edema noted. Skin: Warm/dry, no rashes.  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 Last MRSA PCR was negative on 11/2; ID stopped Zyvox  11/16. MRSA swab negative. - ID following, appreciate recommendations - Continue cefazolin  per ID - No indication for treatment of candida at present  Acute hypoxic and hypercarbic respiratory failure 2/2 MSSA PNA, ARDS, mucous plugging, and septic pulmonary emboli Deconditioning and generalized weakness are contributing factors along w/ ineffective airway clearance. POCUS 11/16 no evidence of effusion. - Continue full vent support (4-8cc/kg IBW) - Wean FiO2 for O2 sat > 90% - Daily WUA/SBT as tolerated - VAP bundle - Pulmonary hygiene - PAD protocol for sedation: Propofol  and Fentanyl  for goal RASS 0 to -1 - F/u finalized Resp Cx - Ongoing GOC discussion re: aggressive care (trach, given third intubation) versus transition to comfort-focused care - Appreciate PMT assistance  Post-intubation hypotension History of hypertension - Goal MAP > 65 - Volume resuscitation as tolerated, Hgb stable today - Off of vasopressors at present - Hold home antihypertensives  Acute Metabolic Encephalopathy 2/2 hypercarbia superimposed difficult to control pain and h/o substance abuse and chronic anxiety  Minimal response to narcan  previously. - Correct metabolic derangements as able - Limit sedating medications, low doses when necessary, avoid polypharmacy - Delirium precautions - PAD protocol as above  Acute renal failure further c/b urinary retention req'd Urology placement not foley 11/16 Hypernatremia - Trend BMP, Cr plateaued, UOP improved (1.3L/24H) - Replete electrolytes as indicated - Monitor I&Os - Keep Foley in place (placed by Urology) - Avoid nephrotoxic agents as able - Ensure adequate renal perfusion - Appreciate Nephro recommendations  Anemia, unknown etiology 1U PRBCs  given 11/18 got Hgb 6.0, additional 1U PRBCs 11/19AM for Hgb 6.8. - Trend H&H, stable today 11/20 - Monitor for signs of active bleeding - Transfuse for Hgb < 7.0 or hemodynamically significant bleeding  Elevated Alk phos  - Intermittent LFT monitoring  Protein calorie malnutrition  - Appreciate RD assistance - TF via OGT  Stage 2 pressure ulcer sacrum, present on admission - WOC  Critical care time:   The patient is critically ill with multiple organ system failure and requires high complexity decision making for assessment and support, frequent evaluation and titration of therapies, advanced monitoring, review of radiographic studies and interpretation of complex data.   Critical Care Time devoted to patient care services, exclusive of separately billable procedures, described in this note is 34 minutes.  Corean CHRISTELLA Anajah Sterbenz, PA-C  Pulmonary & Critical Care 05/02/24 7:28 AM  Please see [email or web address] for pager details.  From 7A-7P if no response, please call [phone] After hours, please call ELink [phone]

## 2024-05-02 NOTE — TOC Progression Note (Signed)
 Transition of Care [facility]) - Progression Note    Patient Details  Name: [name] [name] MRN: [id number] Date of Birth: 05-11-1976  Transition of Care [facility]) CM/SW Contact  Tom-Johnson, Murline Weigel Daphne, RN Phone Number: 05/02/2024, 3:26 PM  Clinical Narrative:     CM consulted for [facility] referral. CM spoke with patient at bedside and wife, Warren via phone. Warren requested patient not sent out of [place]  and not far from [place]. CM gave Warren the two local [facility] choices. Warren consented for referral sent to both [facility]. CM sent referral via [facility], [facility] declined.  [facility] is considering at this time. CM spoke with DJ, Liaison at [facility]. Patient does not have Medical Insurance, showing Medicaid Potential.  DJ will reach out to leadership to do a contract.   CM will continue to follow as patient progresses with care towards discharge.                    Expected Discharge Plan and Services                                               Social Drivers of Health (SDOH) Interventions SDOH Screenings   Food Insecurity: No Food Insecurity (03/15/2024)  Housing: Low Risk  (03/15/2024)  Transportation Needs: No Transportation Needs (03/15/2024)  Utilities: Not At Risk (03/15/2024)  Tobacco Use: High Risk (03/14/2024)    Readmission Risk Interventions    04/15/2024    4:16 PM 03/25/2024    9:50 AM 03/22/2024   10:01 AM  Readmission Risk Prevention Plan  Transportation Screening Complete Complete Complete  PCP or Specialist Appt within 5-7 Days Complete    Home Care Screening Complete Complete Complete  Medication Review (RN CM) Referral to Pharmacy Complete Complete

## 2024-05-02 NOTE — Procedures (Signed)
 Bedside Bronchoscopy Procedure Note [name] [name] [id number] 06-25-1975  Procedure: Bronchoscopy Indications: Diagnostic evaluation of the airways, Obtain specimens for culture and/or other diagnostic studies, and Remove secretions  Procedure Details: ET Tube Size: ET Tube secured at lip (cm): Bite block in place: Yes In preparation for procedure, Patient hyper-oxygenated with 100 % FiO2 Airway entered and the following bronchi were examined: RUL, RML, RLL, LUL, LLL, and Bronchi.   Bronchoscope removed.  , Patient placed back on 30% FiO2 at conclusion of procedure.    Evaluation BP 114/78   Pulse 92   Temp 98 F (36.7 C) (Axillary)   Resp 11   Ht 6' (1.829 m)   Wt 72.6 kg   SpO2 100%   BMI 21.71 kg/m  Breath Sounds:Clear O2 sats: stable throughout Patient's Current Condition: stable Specimens:  Sent copious amount of thick tan / pink tinged secretions collected.  Complications: No apparent complications Patient did tolerate procedure well.   [name] L 05/02/2024, 11:02 AM

## 2024-05-02 NOTE — Progress Notes (Signed)
 Nephrology Follow-Up Consult note   Assessment/Recommendations: [name] [name] is a/an 48 y.o. male with a past medical history significant for substance use disorder who present w/ TV endocarditis c/b altered mental status, acute hypoxic respiratory failure, AKI   Nonoliguric AKI: Baseline creatinine normal.  Creatinine up to 3.2.  Likely tubular injury related to hemodynamic shifts and changes associated with this acute illness.  Most likely some ATN.  Glomerular disease associated with infection is possible.  Interstitial nephritis also possible but Zosyn recently stopped which would be the most common medication to cause this -Previous urinalysis with large blood but this was likely related to Foley catheter.  However may consider sediment evaluation if he fails to improve -Had some obstruction and Foley catheter now placed -Continue to monitor daily Cr, Dose meds for GFR -Monitor Daily I/Os, Daily weight  -Maintain MAP>65 for optimal renal perfusion.  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  Continue antibiotics per ID recommendations   Acute hypoxic and hypercarbic respiratory failure: ARDS, septic emboli, altered mental status all contributing.  Ventilation per primary team   Hypotension: Associated with sedating medications to some degree.  Utilize pressors as needed   Recommendations conveyed to primary service.    [facility] 05/02/2024 11:32 AM  ___________________________________________________________  CC: AMS  Interval History/Subjective: Fairly stable over the last 24 hours.  Underwent bronchoscopy today.  Urine output stable and creatinine stable   Medications:  Current Facility-Administered Medications  Medication Dose Route Frequency Provider Last Rate  Last Admin   acetaminophen  (TYLENOL ) tablet 650 mg  650 mg Oral Q6H PRN Marshall, Jessica, DO   650 mg at 05/01/24 2103   ALPRAZolam  (XANAX ) tablet 0.5 mg  0.5 mg Per Tube Q8H Ilah Krabbe M, PA-C   0.5 mg at 05/02/24 0504   ceFAZolin  (ANCEF ) IVPB 2g/100 mL premix  2 g Intravenous Q8H Calone, Gregory D, FNP   Stopped at 05/02/24 0540   Chlorhexidine Gluconate Cloth 2 % PADS 6 each  6 each Topical Daily Shelah Lamar RAMAN, MD   6 each at 05/01/24 2104   dexmedetomidine (PRECEDEX) 400 MCG/100ML (4 mcg/mL) infusion  0-1.2 mcg/kg/hr Intravenous Titrated Ilah Krabbe M, PA-C       docusate (COLACE) 50 MG/5ML liquid 100 mg  100 mg Oral BID PRN Sharie Bourbon, MD   100 mg at 04/27/24 2226   feeding supplement (OSMOLITE 1.5 CAL) liquid 1,000 mL  1,000 mL Per Tube Continuous Olalere, Adewale A, MD 55 mL/hr at 05/02/24 0800 Infusion Verify at 05/02/24 0800   feeding supplement (PROSource TF20) liquid 60 mL  60 mL Per Tube Daily Jenna Maude BRAVO, NP   60 mL at 05/02/24 1117   fentaNYL  (SUBLIMAZE ) injection 50 mcg  50 mcg Intravenous Q15 min PRN Babcock, Peter E, NP       fentaNYL  (SUBLIMAZE ) injection 50-200 mcg  50-200 mcg Intravenous Q30 min PRN Babcock, Peter E, NP   100 mcg at 05/02/24 1022   folic acid  (FOLVITE ) tablet 1 mg  1 mg Per Tube Daily Babcock, Peter E, NP   1 mg at 05/02/24 1117   free water 200 mL  200 mL Per Tube Q6H Jenna Maude BRAVO, NP   200 mL at 05/02/24 1124   gabapentin (NEURONTIN) 250 MG/5ML solution 100 mg  100 mg Per Tube Q12H Babcock, Peter E, NP   100  mg at 05/02/24 1124   guaiFENesin  (ROBITUSSIN) 100 MG/5ML liquid 5 mL  5 mL Per Tube Q4H Jenna Maude BRAVO, NP   5 mL at 05/02/24 1117   ipratropium-albuterol  (DUONEB) 0.5-2.5 (3) MG/3ML nebulizer solution 3 mL  3 mL Nebulization Q4H PRN Paliwal, Aditya, MD   3 mL at 05/02/24 0733   lidocaine  (LIDODERM ) 5 % 1 patch  1 patch Transdermal Q24H Judeth Trenda BIRCH, MD   1 patch at 05/02/24 1117   midodrine  (PROAMATINE ) tablet 5 mg  5  mg Per Tube TID WC Macel Jayson PARAS, MD   5 mg at 05/02/24 1117   multivitamin with minerals tablet 1 tablet  1 tablet Per Tube Daily Jenna Maude BRAVO, NP   1 tablet at 05/02/24 1117   norepinephrine  (LEVOPHED ) 16 mg in (0.064 mg/mL) premix infusion  0-40 mcg/min Intravenous Titrated Jenna Maude BRAVO, NP   Stopped at 04/29/24 1022   ondansetron  (ZOFRAN ) injection 4 mg  4 mg Intravenous Q6H PRN Smith, Joshua C, NP       Oral care mouth rinse  15 mL Mouth Rinse PRN Sharie Bourbon, MD       Oral care mouth rinse  15 mL Mouth Rinse Q2H Jenna Maude BRAVO, NP   15 mL at 05/02/24 1125   Oral care mouth rinse  15 mL Mouth Rinse PRN Jenna Maude BRAVO, NP       oxyCODONE  (Oxy IR/ROXICODONE ) immediate release tablet 5 mg  5 mg Per Tube Q6H Jenna Maude BRAVO, NP   5 mg at 05/02/24 1117   polyethylene glycol (MIRALAX  / GLYCOLAX ) packet 17 g  17 g Per Tube Daily PRN Jenna Maude BRAVO, NP   17 g at 04/30/24 1041   propofol  (DIPRIVAN ) 1000 MG/100ML infusion  0-80 mcg/kg/min Intravenous Continuous Jenna Maude BRAVO, NP   Stopped at 05/02/24 0744   sodium chloride  HYPERTONIC 3 % nebulizer solution 4 mL  4 mL Nebulization BID Ilah Krabbe M, PA-C   4 mL at 05/02/24 9266   vasopressin  (PITRESSIN) 20 Units in 100 mL (0.2 unit/mL) infusion-*FOR SHOCK*  0-0.03 Units/min Intravenous Continuous Jenna Maude BRAVO, NP   Stopped at 04/28/24 1559      Review of Systems:  unable to obtain due to the patient being intubated  Physical Exam: Vitals:   05/02/24 1033 05/02/24 1103  BP: 114/78   Pulse: 92   Resp:    Temp:  98.2 F (36.8 C)  SpO2: 100%    Total I/O In: 259.7 [I.V.:4.7; NG/GT:255] Out: 200 [Urine:200]  Intake/Output Summary (Last 24 hours) at 05/02/2024 1132 Last data filed at 05/02/2024 1124 Gross per 24 hour  Intake 2589.2 ml  Output 1500 ml  Net 1089.2 ml   Constitutional: hronically ill-appearing, no distress ENMT: ears and nose without scars or lesions, MMM CV: normal rate, no  edema Respiratory: Coarse bilateral breath sounds, bilateral chest rise, ventilated Gastrointestinal: soft, non-tender, no palpable masses or hernias Skin: Scattered excoriations in various stages of healing, otherwise no lesions Psych: Awake and alert, interactive   Test Results I personally reviewed new and old clinical labs and radiology tests Lab Results  Component Value Date   NA 135 05/02/2024   K 4.1 05/02/2024   CL 98 05/02/2024   CO2 25 05/02/2024   BUN 97 (H) 05/02/2024   CREATININE 3.24 (H) 05/02/2024   CALCIUM  7.8 (L) 05/02/2024   ALBUMIN  2.0 (L) 04/27/2024   PHOS 6.9 (H) 05/01/2024    CBC Recent Labs  Lab 04/30/24 0725 04/30/24 1422 05/01/24 0306 05/01/24 1419 05/02/24 0318  WBC 6.9  --  7.6  --  7.1  HGB 6.0*   < > 6.8* 8.0* 7.9*  HCT 19.8*   < > 21.8* 24.4* 25.0*  MCV 97.1  --  95.2  --  90.9  PLT 97*  --  101*  --  107*   < > = values in this interval not displayed.

## 2024-05-02 NOTE — Progress Notes (Signed)
 eLink Physician-Brief Progress Note Patient Name: [name] [name] DOB: 22-Feb-1976 MRN: [id number]   Date of Service  05/02/2024  HPI/Events of Note  Critical BAL-yeast with pseudohyphae high Known disseminated MSSA infection with TV vegetation  eICU Interventions  Maintain cefazolin , unlikely this is relevant-defer to ID in the morning     Intervention Category Minor Interventions: Clinical assessment - ordering diagnostic tests  [name] 05/02/2024, 9:03 PM

## 2024-05-02 NOTE — Progress Notes (Signed)
 Patient was transported to CT & back to 3M14 without any complications.

## 2024-05-03 DIAGNOSIS — R339 Retention of urine, unspecified: Secondary | ICD-10-CM

## 2024-05-03 DIAGNOSIS — I2699 Other pulmonary embolism without acute cor pulmonale: Secondary | ICD-10-CM

## 2024-05-03 DIAGNOSIS — J80 Acute respiratory distress syndrome: Secondary | ICD-10-CM

## 2024-05-03 DIAGNOSIS — I5020 Unspecified systolic (congestive) heart failure: Secondary | ICD-10-CM

## 2024-05-03 LAB — BASIC METABOLIC PANEL WITH GFR
Anion gap: 16 — ABNORMAL HIGH (ref 5–15)
BUN: 99 mg/dL — ABNORMAL HIGH (ref 6–20)
CO2: 21 mmol/L — ABNORMAL LOW (ref 22–32)
Calcium: 8 mg/dL — ABNORMAL LOW (ref 8.9–10.3)
Chloride: 100 mmol/L (ref 98–111)
Creatinine, Ser: 2.96 mg/dL — ABNORMAL HIGH (ref 0.61–1.24)
GFR, Estimated: 26 mL/min — ABNORMAL LOW (ref >60)
Glucose, Bld: 119 mg/dL — ABNORMAL HIGH (ref 70–99)
Potassium: 4.7 mmol/L (ref 3.5–5.1)
Sodium: 137 mmol/L (ref 135–145)

## 2024-05-03 LAB — CBC WITH DIFFERENTIAL/PLATELET
Abs Immature Granulocytes: 0.02 K/uL (ref 0.00–0.07)
Basophils Absolute: 0 K/uL (ref 0.0–0.1)
Basophils Relative: 0 %
Eosinophils Absolute: 0.1 K/uL (ref 0.0–0.5)
Eosinophils Relative: 2 %
HCT: 23.5 % — ABNORMAL LOW (ref 39.0–52.0)
Hemoglobin: 7.4 g/dL — ABNORMAL LOW (ref 13.0–17.0)
Immature Granulocytes: 0 %
Lymphocytes Relative: 8 %
Lymphs Abs: 0.4 K/uL — ABNORMAL LOW (ref 0.7–4.0)
MCH: 28.8 pg (ref 26.0–34.0)
MCHC: 31.5 g/dL (ref 30.0–36.0)
MCV: 91.4 fL (ref 80.0–100.0)
Monocytes Absolute: 0.6 K/uL (ref 0.1–1.0)
Monocytes Relative: 13 %
Neutro Abs: 3.5 K/uL (ref 1.7–7.7)
Neutrophils Relative %: 77 %
Platelets: 97 K/uL — ABNORMAL LOW (ref 150–400)
RBC: 2.57 MIL/uL — ABNORMAL LOW (ref 4.22–5.81)
RDW: 18.7 % — ABNORMAL HIGH (ref 11.5–15.5)
Smear Review: NORMAL
WBC: 4.6 K/uL (ref 4.0–10.5)
nRBC: 0 % (ref 0.0–0.2)

## 2024-05-03 LAB — GLUCOSE, CAPILLARY
Glucose-Capillary: 103 mg/dL — ABNORMAL HIGH (ref 70–99)
Glucose-Capillary: 112 mg/dL — ABNORMAL HIGH (ref 70–99)
Glucose-Capillary: 113 mg/dL — ABNORMAL HIGH (ref 70–99)
Glucose-Capillary: 113 mg/dL — ABNORMAL HIGH (ref 70–99)
Glucose-Capillary: 125 mg/dL — ABNORMAL HIGH (ref 70–99)
Glucose-Capillary: 126 mg/dL — ABNORMAL HIGH (ref 70–99)
Glucose-Capillary: 94 mg/dL (ref 70–99)

## 2024-05-03 LAB — APTT: aPTT: 34 s (ref 24–36)

## 2024-05-03 LAB — PROTIME-INR
INR: 1.3 — ABNORMAL HIGH (ref 0.8–1.2)
Prothrombin Time: 16.4 s — ABNORMAL HIGH (ref 11.4–15.2)

## 2024-05-03 LAB — PHOSPHORUS: Phosphorus: 7.4 mg/dL — ABNORMAL HIGH (ref 2.5–4.6)

## 2024-05-03 LAB — MAGNESIUM: Magnesium: 2 mg/dL (ref 1.7–2.4)

## 2024-05-03 NOTE — Progress Notes (Signed)
 Physical Therapy Treatment Patient Details Name: [name] [name] MRN: [id number] DOB: Aug 07, 1975 Today's Date: 05/03/2024   History of Present Illness 48 yo M recently admitted with MSSA PNA MSSA bacteremia psoas abscesses and lumbar osteo 03/14/24-03/25/24 (left AMA) who presented to [facility] ED 04/14/24 w AMS. Admitted with sepsis and transferred to [facility]. Arrived to ED covered in feces, altered, with poor airway protection and was intubated, Had PEA cardiac arrest on 11/3 with 4 mins CPR extubated on 11/9. 11/13  MRI this admission shows septic arthritis at skull base, T5-T6 osteomyelitis/discitis.neurosurgery recommending non op tx of L spine and miami J collar for C spine. Last intubation was 11/16. Course complicated by PNA. PMH includes: anxiety, HTN    PT Comments  Pt is happy to see therapists, requesting pain medication prior to moving. Pt had slid down in the bed and was experiencing increased low back pain. Pt agreeable to being slid up in the bed. Pt able to bend legs to assist in pushing up but experiences back spasms with pad scoot towards HoB. Performed limited AAROM of LE. SLP provided pt with clip board and marker and he was able to communicate with therapy about pain and reports being sorry he could not do more. Pt agrees to rolling to L to place neck in neutral rotation, pt supported posteriorly with pillows. Pt request pain medication and sleep aide at end of session. D/c plans remain appropriate. PT will continue to follow acutely     If plan is discharge home, recommend the following: Assist for transportation;Help with stairs or ramp for entrance;A lot of help with bathing/dressing/bathroom;Assistance with cooking/housework;Direct supervision/assist for medications management;Direct supervision/assist for financial management;Two people to help with walking and/or transfers   Can travel by private vehicle     No  Equipment Recommendations  None recommended by PT       Precautions  / Restrictions Precautions Precautions: Fall;Cervical;Back Precaution Booklet Issued: No Precaution/Restrictions Comments: ETT, Cortrak, indewelling catheter Required Braces or Orthoses: Cervical Brace Cervical Brace: Hard collar (can don at EOB, can be removed to walk to the bathroom (per active orders)) Restrictions Weight Bearing Restrictions Per Provider Order: No     Mobility  Bed Mobility Overal bed mobility: Needs Assistance Bed Mobility: Rolling Rolling: Total assist, +2 for physical assistance         General bed mobility comments: total Ax2 for Rolling to L sided to position neck in more neutral rotation    Transfers                   General transfer comment: unable to try egress due to increased back spasms             Communication Communication Communication: Impaired;Other (comment) (able to answer questions and asks for things using clip board and marker) Factors Affecting Communication: Trach/intubated  Cognition Arousal: Alert Behavior During Therapy: [facility] for tasks assessed/performed     Difficult to assess due to: Intubated                     PT - Cognition Comments: able to write down needs on clip board with marker Following commands: Impaired Following commands impaired: Follows one step commands with increased time, Follows one step commands inconsistently    Cueing Cueing Techniques: Verbal cues, Tactile cues  Exercises General Exercises - Lower Extremity Ankle Circles/Pumps: PROM, Both Heel Slides: Both, AAROM, Other (comment) (stretch)    General Comments  VSS on ventilator, 30% FiO2, PEEP  5       Pertinent Vitals/Pain Pain Assessment Pain Assessment: 0-10 Faces Pain Scale: Hurts worst Pain Location: back spasms with pulling up in the bed and for rolling L towards the ventilator to reduce R neck rotation Pain Descriptors / Indicators: Discomfort, Grimacing Pain Intervention(s): Limited activity within patient's  tolerance, Monitored during session, Repositioned, Patient requesting pain meds-RN notified, RN gave pain meds during session     PT Goals (current goals can now be found in the care plan section) Acute Rehab PT Goals PT Goal Formulation: Patient unable to participate in goal setting Time For Goal Achievement: 05/09/24 Potential to Achieve Goals: Poor Progress towards PT goals: Not progressing toward goals - comment    Frequency    Min 2X/week       Co-evaluation PT/OT/SLP Co-Evaluation/Treatment: Yes Reason for Co-Treatment: Complexity of the patient's impairments (multi-system involvement);To address functional/ADL transfers PT goals addressed during session: Strengthening/ROM OT goals addressed during session: ADL's and self-care      AM-PAC PT 6 Clicks Mobility   Outcome Measure  Help needed turning from your back to your side while in a flat bed without using bedrails?: Total Help needed moving from lying on your back to sitting on the side of a flat bed without using bedrails?: Total Help needed moving to and from a bed to a chair (including a wheelchair)?: Total Help needed standing up from a chair using your arms (e.g., wheelchair or bedside chair)?: Total Help needed to walk in hospital room?: Total Help needed climbing 3-5 steps with a railing? : Total 6 Click Score: 6    End of Session Equipment Utilized During Treatment: Oxygen Activity Tolerance: Treatment limited secondary to medical complications (Comment);Patient limited by pain (intubation) Patient left: in bed;with call bell/phone within reach;with bed alarm set Nurse Communication: Mobility status;Other (comment) (Need for Prevalon boots to protect heels from pressure injury) PT Visit Diagnosis: Unsteadiness on feet (R26.81);Muscle weakness (generalized) (M62.81);Difficulty in walking, not elsewhere classified (R26.2)     Time: 8652-8579 PT Time Calculation (min) (ACUTE ONLY): 33 min  Charges:     $Therapeutic Activity: 8-22 mins PT General Charges $$ ACUTE PT VISIT: 1 Visit                     Ryna Beckstrom B. Fleeta Lapidus PT, DPT Acute Rehabilitation Services Please use secure chat or  Call Office [phone]    Almarie KATHEE Fleeta [facility] 05/03/2024, 4:01 PM

## 2024-05-03 NOTE — Progress Notes (Signed)
 Occupational Therapy Treatment Patient Details Name: [name] [name] MRN: [id number] DOB: Dec 12, 1975 Today's Date: 05/03/2024   History of present illness 48 yo M recently admitted with MSSA PNA MSSA bacteremia psoas abscesses and lumbar osteo 03/14/24-03/25/24 (left AMA) who presented to [facility] ED 04/14/24 w AMS. Admitted with sepsis and transferred to [facility]. Arrived to ED covered in feces, altered, with poor airway protection and was intubated, Had PEA cardiac arrest on 11/3 with 4 mins CPR extubated on 11/9. 11/13  MRI this admission shows septic arthritis at skull base, T5-T6 osteomyelitis/discitis.neurosurgery recommending non op tx of L spine and miami J collar for C spine. Last intubation was 11/16. Course complicated by PNA. PMH includes: anxiety, HTN   OT comments  Pt greeted in bed, agreeable for OT session. Pre-medicated by RN prior to arrival. Pt remains intubated and awake, VSS on 30% FiO2 / PEEP 5. Engaged with therapists today, although significantly limited by back spasms & neck discomfort. Pt with improved participation and engagement today, able to communicate via written info on clip board today. Unable to tolerate HOB elevation in preparation for ADLs and egress/chair position in bed. Rolled with total A +2 for repositioning and pressure relief. Bony prominences propped with pillows for therapeutic postioning. Consider removing Aspen [facility] during next visit (as pt tending to rest with head laterally flexed and rotated, likely attributing to neck soreness/stiffness). RN updated. Plan for trach next week per RN. OT to continue to follow.      If plan is discharge home, recommend the following:  Two people to help with walking and/or transfers;Two people to help with bathing/dressing/bathroom;Direct supervision/assist for medications management;Direct supervision/assist for financial management;Assist for transportation;Help with stairs or ramp for entrance   Equipment Recommendations  Other  (comment) (defer)    Recommendations for Other Services      Precautions / Restrictions Precautions Precautions: Fall;Cervical;Back Precaution Booklet Issued: No Precaution/Restrictions Comments: ETT, Cortrak Required Braces or Orthoses: Cervical Brace ([place] [facility]) Cervical Brace: Hard collar (can don at EOB, can be removed to walk to the bathroom (per active orders)) Restrictions Weight Bearing Restrictions Per Provider Order: No       Mobility Bed Mobility Overal bed mobility: Needs Assistance Bed Mobility: Rolling Rolling: Total assist, +2 for physical assistance         General bed mobility comments: rolled for repostioning and optimal pressure relief and spinal alignment (pt tending to rotate head to the R in [facility])    Transfers                   General transfer comment: pt unable to tolerate HOB elevation 2/2 back spasms - deferred     Balance                                           ADL either performed or assessed with clinical judgement   ADL Overall ADL's : Needs assistance/impaired Eating/Feeding: NPO   Grooming: Minimal assistance                                      Extremity/Trunk Assessment Upper Extremity Assessment RUE Deficits / Details: mild edematous B hands, much improved from previous session            Vision       Perception  Praxis     Communication Communication Communication: Impaired;Other (comment) (writing answers to questions down on clipboard with pen/marker, needs intermittent assist to stabilize clipboard and maintain good grip on marker) Factors Affecting Communication: Trach/intubated (ETT)   Cognition Arousal: Alert Behavior During Therapy: WFL for tasks assessed/performed               OT - Cognition Comments: pt tearful towards end of session, restating I'm sorry, provided therapeutic listening and reassurance                 Following commands:  Impaired Following commands impaired: Follows one step commands with increased time, Follows one step commands inconsistently      Cueing   Cueing Techniques: Verbal cues, Tactile cues  Exercises      Shoulder Instructions       General Comments improved BUE edema, con't cervical rotation/neck flexion at rest in [facility]    Pertinent Vitals/ Pain       Pain Assessment Pain Assessment: Faces Faces Pain Scale: Hurts whole lot Pain Location: back spasms intermittently with HOB elevation and bed level repositioning Pain Descriptors / Indicators: Discomfort, Grimacing, Guarding Pain Intervention(s): Monitored during session, Repositioned, Limited activity within patient's tolerance, Premedicated before session  Home Living                                          Prior Functioning/Environment              Frequency  Min 2X/week        Progress Toward Goals  OT Goals(current goals can now be found in the care plan section)  Progress towards OT goals: Progressing toward goals (slowly)     Plan      Co-evaluation    PT/OT/SLP Co-Evaluation/Treatment: Yes Reason for Co-Treatment: Complexity of the patient's impairments (multi-system involvement);To address functional/ADL transfers PT goals addressed during session: Strengthening/ROM OT goals addressed during session: ADL's and self-care      AM-PAC OT 6 Clicks Daily Activity     Outcome Measure   Help from another person eating meals?: Total Help from another person taking care of personal grooming?: A Little Help from another person toileting, which includes using toliet, bedpan, or urinal?: Total Help from another person bathing (including washing, rinsing, drying)?: A Lot Help from another person to put on and taking off regular upper body clothing?: A Lot Help from another person to put on and taking off regular lower body clothing?: A Lot 6 Click Score: 11    End of Session Equipment  Utilized During Treatment: Oxygen;Cervical collar  OT Visit Diagnosis: Unsteadiness on feet (R26.81);Muscle weakness (generalized) (M62.81);Pain   Activity Tolerance No increased pain   Patient Left in bed;with call bell/phone within reach;with bed alarm set   Nurse Communication Other (comment) (status of session)        Time: 1348-1430 OT Time Calculation (min): 42 min  Charges: OT General Charges $OT Visit: 1 Visit OT Treatments $Therapeutic Activity: 8-22 mins  [name] [name], OTR/L [facility] Acute Rehabilitation Services [phone] Secure Chat Preferred  Rebel Laughridge 05/03/2024, 4:15 PM

## 2024-05-03 NOTE — Evaluation (Signed)
 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   Acute septic pulmonary embolism without acute cor pulmonale (HCC) 04/30/2024   Sepsis (HCC) 04/14/2024   Severe sepsis (HCC) 04/14/2024   Protein-calorie malnutrition, severe 03/22/2024   Septic embolism (HCC) 03/15/2024   Pressure injury of skin 03/15/2024   Lactic acidosis 03/15/2024   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   Thrombocytopenia 03/15/2024   Transaminasemia 03/15/2024   Hypoalbuminemia 03/15/2024   Failure to thrive in adult 03/15/2024   Sepsis due to pneumonia (HCC) 11/07/2016   Leukopenia 11/07/2016   Acute respiratory failure with hypoxia and hypercapnia (HCC) 11/07/2016   Anxiety disorder 11/07/2016   Hyperbilirubinemia 11/07/2016   Fever 01/18/2014   MSSA bacteremia 01/18/2014   CAP (community acquired pneumonia) 05/19/2013   Sinus tachycardia 05/19/2013   Tobacco abuse 05/19/2013   Past Medical History:  Past Medical History:  Diagnosis Date   Anxiety    Back pain    Fracture of rib of right side 01/26/2015   Hypertension    Past Surgical History:  Past Surgical History:  Procedure Laterality Date   TEE WITHOUT CARDIOVERSION N/A 01/21/2014   Procedure: TRANSESOPHAGEAL ECHOCARDIOGRAM (TEE) with propofol ;  Surgeon: Dorn JULIANNA Ross, MD;  Location: [facility];  Service: Endoscopy;  Laterality: N/A;   HPI:  48 yo M recently admitted with MSSA PNA MSSA bacteremia psoas abscesses and lumbar osteo 03/14/24-03/25/24 (left AMA) who presented to [facility] ED 04/14/24 w AMS. Admitted with sepsis and transferred to [facility]. Arrived to ED covered in feces, altered, with poor airway protection and was intubated, Had PEA cardiac arrest  on 11/3 with 4 mins CPR extubated on 11/9. 11/13  MRI this admission shows septic arthritis at skull base, T5-T6 osteomyelitis/discitis.neurosurgery recommending non op tx of L spine and miami J collar for C spine. Last intubation was 11/16. Course complicated by PNA. PMH includes: anxiety, HTN   Assessment / Plan / Recommendation Clinical Impression  Initiated preliminary augmentative/alternative communication assessment during portion of OT/PT treatment. Pt demonstrates accurate yes/no reliability. Uses combination of thumbs up/down and head nods/shakes to communicate answers to simple questions. Clipboard available at bedside and pt was able to communicate basic needs/ideas legibly by writing and with occasional support of board.  Basic reading is functional.  Handout left at bedside with communication topics itemized to help narrow down subject matter.  Pt tired and asking SLP to return at later time; appropriately emotional re: situation.  SLP will follow for communication assistance. Per PCCM, he will likely undergo trach in the next 24-48 hours. If candidate, can pursue inline valve trials when warranted.  SLP will follow.    SLP Assessment  SLP Recommendation/Assessment: Patient needs continued Speech Language Pathology Services SLP Visit Diagnosis: Cognitive communication deficit (R41.841)     Assistance Recommended at Discharge  Frequent or constant Supervision/Assistance  Functional Status Assessment Patient has had a recent decline in their functional status and demonstrates the ability to make significant improvements in function in a reasonable and predictable amount of time.  Frequency and Duration min 2x/week  2 weeks      SLP Evaluation Cognition    Ms [facility]      Comprehension  Auditory Comprehension Yes/No Questions: Within Functional Limits Reading Comprehension Reading Status: Within  funtional limits    Expression Expression Primary Mode of Expression: Nonverbal -  written Verbal Expression Overall Verbal Expression: Other (comment) (orally intubated) Written Expression Dominant Hand: Right Written Expression: Not tested   Oral / Motor  Oral Motor/Sensory Function Overall Oral Motor/Sensory Function:  (orally intubated)            Vona Palma Laurice 05/03/2024, 4:23 PM Kitti Mcclish L. Vona, MA CCC/SLP Clinical Specialist - Acute Care SLP Acute Rehabilitation Services Office number [phone]

## 2024-05-03 NOTE — Progress Notes (Signed)
 Nephrology Follow-Up Consult note   Assessment/Recommendations: [name] [name] is a/an 48 y.o. male with a past medical history significant for substance use disorder who present w/ TV endocarditis c/b altered mental status, acute hypoxic respiratory failure, AKI   Nonoliguric AKI: Baseline creatinine normal.  Creatinine up to 3.2.  Likely tubular injury related to hemodynamic shifts and changes associated with this acute illness.  Most likely some ATN.  Glomerular disease associated with infection is possible.  Interstitial nephritis also possible but Zosyn  recently stopped which would be the most common medication to cause this -Previous urinalysis with large blood but this was likely related to Foley catheter -Creatinine slowly improving.  Nothing to add at this time.  Likely AKI will slowly resolve with time.  We will sign off.  Please do not hesitate to contact us  if further help is needed -Continue to monitor daily Cr, Dose meds for GFR -Monitor Daily I/Os, Daily weight  -Maintain MAP>65 for optimal renal perfusion.  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  Continue antibiotics per ID recommendations   Acute hypoxic and hypercarbic respiratory failure: ARDS, septic emboli, altered mental status all contributing.  Ventilation per primary team   Hypotension: Associated with sedating medications to some degree.  Started midodrine .  Utilize pressors as needed   Recommendations conveyed to primary service.    [facility] 05/03/2024 10:37 AM  ___________________________________________________________  CC: AMS  Interval History/Subjective: Continues to be fairly stable.  Creatinine minimally improved.  Urine output slightly increased   Medications:  Current  Facility-Administered Medications  Medication Dose Route Frequency Provider Last Rate Last Admin   acetaminophen  (TYLENOL ) tablet 650 mg  650 mg Oral Q6H PRN Marshall, Jessica, DO   650 mg at 05/02/24 2121   ALPRAZolam  (XANAX ) tablet 0.5 mg  0.5 mg Per Tube Q8H Ilah Krabbe M, PA-C   0.5 mg at 05/03/24 0503   ceFAZolin  (ANCEF ) IVPB 2g/100 mL premix  2 g Intravenous Q8H Calone, Gregory D, FNP   Stopped at 05/03/24 0532   Chlorhexidine  Gluconate Cloth 2 % PADS 6 each  6 each Topical Daily Shelah Lamar RAMAN, MD   6 each at 05/02/24 2129   dexmedetomidine  (PRECEDEX ) 400 MCG/100ML (4 mcg/mL) infusion  0-1.2 mcg/kg/hr Intravenous Titrated Ilah Krabbe M, PA-C 9.08 mL/hr at 05/03/24 0800 0.5 mcg/kg/hr at 05/03/24 0800   docusate (COLACE) 50 MG/5ML liquid 100 mg  100 mg Oral BID PRN Sharie Bourbon, MD   100 mg at 04/27/24 2226   feeding supplement (OSMOLITE 1.5 CAL) liquid 1,000 mL  1,000 mL Per Tube Continuous Olalere, Adewale A, MD 55 mL/hr at 05/03/24 0800 Infusion Verify at 05/03/24 0800   feeding supplement (PROSource TF20) liquid 60 mL  60 mL Per Tube Daily Jenna Maude BRAVO, NP   60 mL at 05/03/24 0939   fentaNYL  (SUBLIMAZE ) injection 50 mcg  50 mcg Intravenous Q15 min PRN Babcock, Peter E, NP       fentaNYL  (SUBLIMAZE ) injection 50-200 mcg  50-200 mcg Intravenous Q30 min PRN Babcock, Peter E, NP   100 mcg at 05/03/24 0759   folic acid  (FOLVITE ) tablet 1 mg  1 mg Per Tube Daily Babcock, Peter E, NP   1 mg at 05/03/24 0939   free water  200 mL  200 mL Per Tube Q6H Jenna Maude BRAVO, NP   200 mL at 05/03/24 0503  gabapentin  (NEURONTIN ) 250 MG/5ML solution 100 mg  100 mg Per Tube Q12H Jenna Maude BRAVO, NP   100 mg at 05/03/24 0939   guaiFENesin  (ROBITUSSIN) 100 MG/5ML liquid 5 mL  5 mL Per Tube Q4H Jenna Maude BRAVO, NP   5 mL at 05/03/24 0732   heparin  injection 5,000 Units  5,000 Units Subcutaneous Q8H Ilah Krabbe M, PA-C   5,000 Units at 05/03/24 0503   ipratropium-albuterol  (DUONEB)  0.5-2.5 (3) MG/3ML nebulizer solution 3 mL  3 mL Nebulization Q4H PRN Haze Led, MD   3 mL at 05/03/24 9277   lidocaine  (LIDODERM ) 5 % 1 patch  1 patch Transdermal Q24H Judeth Trenda BIRCH, MD   1 patch at 05/02/24 1117   midodrine  (PROAMATINE ) tablet 5 mg  5 mg Per Tube TID WC Macel Jayson PARAS, MD   5 mg at 05/03/24 0732   multivitamin with minerals tablet 1 tablet  1 tablet Per Tube Daily Jenna Maude BRAVO, NP   1 tablet at 05/03/24 9060   ondansetron  (ZOFRAN ) injection 4 mg  4 mg Intravenous Q6H PRN Smith, Joshua C, NP       Oral care mouth rinse  15 mL Mouth Rinse PRN Sharie Bourbon, MD       Oral care mouth rinse  15 mL Mouth Rinse Q2H Jenna Maude BRAVO, NP   15 mL at 05/03/24 9060   Oral care mouth rinse  15 mL Mouth Rinse PRN Jenna Maude BRAVO, NP       oxyCODONE  (Oxy IR/ROXICODONE ) immediate release tablet 5 mg  5 mg Per Tube Q6H Jenna Maude BRAVO, NP   5 mg at 05/03/24 0503   polyethylene glycol (MIRALAX  / GLYCOLAX ) packet 17 g  17 g Per Tube Daily PRN Jenna Maude BRAVO, NP   17 g at 04/30/24 1041      Review of Systems:  unable to obtain due to the patient being intubated  Physical Exam: Vitals:   05/03/24 0830 05/03/24 0844  BP: 102/69   Pulse:    Resp: (!) 24   Temp:  (!) 97.5 F (36.4 C)  SpO2:     Total I/O In: 452.4 [I.V.:49.9; NG/GT:302.5; IV Piggyback:100] Out: 220 [Urine:220]  Intake/Output Summary (Last 24 hours) at 05/03/2024 1037 Last data filed at 05/03/2024 0800 Gross per 24 hour  Intake 2490.51 ml  Output 1720 ml  Net 770.51 ml   Constitutional: chronically ill-appearing, no distress ENMT: ears and nose without scars or lesions, MMM CV: normal rate, no edema Respiratory: Coarse bilateral breath sounds, bilateral chest rise, ventilated Gastrointestinal: soft, non-tender, no palpable masses or hernias Skin: Scattered excoriations in various stages of healing, otherwise no lesions Psych: Awake and alert, interactive   Test Results I personally  reviewed new and old clinical labs and radiology tests Lab Results  Component Value Date   NA 137 05/03/2024   K 4.7 05/03/2024   CL 100 05/03/2024   CO2 21 (L) 05/03/2024   BUN 99 (H) 05/03/2024   CREATININE 2.96 (H) 05/03/2024   CALCIUM  8.0 (L) 05/03/2024   ALBUMIN  2.0 (L) 04/27/2024   PHOS 7.4 (H) 05/03/2024    CBC Recent Labs  Lab 05/01/24 0306 05/01/24 1419 05/02/24 0318 05/03/24 0359  WBC 7.6  --  7.1 4.6  NEUTROABS  --   --   --  3.5  HGB 6.8* 8.0* 7.9* 7.4*  HCT 21.8* 24.4* 25.0* 23.5*  MCV 95.2  --  90.9 91.4  PLT 101*  --  107* 97*

## 2024-05-03 NOTE — Progress Notes (Signed)
 Nutrition Follow-up  DOCUMENTATION CODES:  Severe malnutrition in context of social or environmental circumstances  INTERVENTION:  Continue TF via nGT: 60ml ProSource TF20 once daily Provides 2060 kcal, 103g protein, free water  daily   Free water  flushes 200ml q6h (800ml daily) Total free water  (TF+FWF)-   Plan for trach and PEG per CCM. Will continue with current nutrition plan of care.   NUTRITION DIAGNOSIS:  Severe Malnutrition related to social / environmental circumstances (IVDU and alcohol abuse) as evidenced by severe fat depletion, severe muscle depletion. - remains applicable  GOAL:  Patient will meet greater than or equal to 90% of their needs - goal met via TF  MONITOR:  Vent status, TF tolerance, Labs  REASON FOR ASSESSMENT:  Consult, Ventilator Enteral/tube feeding initiation and management  ASSESSMENT:  Pt admitted with metabolic encephalopathy and presumed septic shock d/t metastatic MSSA infection with concern for endocarditis. PMH significant for recent admission (10/2-10/13 leaving AMA) for MSSA, PNA, psoas abscesses and lumbar osteomyelitis, tobacco use, IVDU and alcohol use  11/2: admitted, transferred from [facility] to [facility] 11/3: extubated; respiratory distress; re-intubated, cardiac arrest, ROSC; echo>LVEF 40-45%, normal RV function 11/7: cortrak placed 11/6: extubated; diet advanced to regular 11/10: transfer out of ICU 11/16: AMS, tachypnea, tachycardic; re-intubated d/t respiratory failure 11/20: s/p bronchoscopy; PMT meeting- full scope, ok with trach/PEG; CT chest -extensive atelectasis, increasing areas of cavitation, pleural effusions, erosive changes at T5-T6, L2-L3, and L4-L5 concerning for osteomyelitis/discitis  Patient remains intubated on vent support.  Tolerating tube feeding at goal rate.   Spoke with MD who reports plan for trach and PEG therefore Cortrak recommendation deferred given patient currently has feeding access as a  bridge to PEG.   Nephrology signed off. Creatinine slowly improving, no indication for HD. Likely AKI will slowly improve with time.   Admit weight: 62.5 kg Current weight: 72 kg  Drains/lines: Right subclavian CVC, triple lumen NGT (tip below the diaphragm)  UOP: x24 hours Net I/O's: +71ml since 11/7  Medications: folvite  daily, MVI  Labs:   BUN 99 Cr 2.96 Calcium  8.0 (no updated albumin  to correct) Anion gap 16 Phosphorus 7.4 GFR 26 CBG's 103-134 x24 hours  Diet Order:   Diet Order             Diet NPO time specified  Diet effective now                   EDUCATION NEEDS:   Not appropriate for education at this time  Skin:  Skin Integrity Issues:: Stage I Stage I: right coccyx Stage II: Per WOC: 2 ruptured blisters 2 cm x 1 cm x 0.1 cm and 0.5 cm x 0.5 cm x 0.1 cm.  Last BM:  11/20 type 4 large  Height:   Ht Readings from Last 1 Encounters:  04/19/24 6' (1.829 m)    Weight:   Wt Readings from Last 1 Encounters:  05/03/24 72 kg   BMI:  Body mass index is 21.53 kg/m.  Estimated Nutritional Needs:   Kcal:  2000-2200  Protein:  100-115g  Fluid:  >/=2L  Royce Maris, RDN, LDN Clinical Nutrition See AMiON for contact information.

## 2024-05-03 NOTE — TOC CAGE-AID Note (Signed)
 Transition of Care [facility]) - CAGE-AID Screening   Patient Details  Name: [name] [name] MRN: [id number] Date of Birth: 05/16/1976  Transition of Care [facility]) CM/SW Contact:    Iwao Shamblin E Loye Reininger, LCSW Phone Number: 05/03/2024, 3:07 PM   Clinical Narrative: Patient is currently intubated.   CAGE-AID Screening: Substance Abuse Screening unable to be completed due to: : Patient unable to participate

## 2024-05-03 NOTE — Progress Notes (Signed)
 NAME:  [name] [name], MRN:  [id number], DOB:  1976/04/10, LOS: 19 ADMISSION DATE:  04/14/2024, CONSULTATION DATE:  04/14/2024 REFERRING MD:  [place] - [facility] EDP, CHIEF COMPLAINT:  AMS  History of Present Illness:  48 year old man who presented to [facility] ED 11/2 with AMS. PMHx significant for MSSA bacteremia, MSSA PNA, infective endocarditis, cervical through lumbar osteomyelitis, psoas abscess, severe protein calorie malnutrition/FTT, substance abuse. Recent admission 10/2 - 10/13 for sepsis, widespread MSSA.  Arrived to [facility] ED covered in feces, altered and with poor airway protection; he was subsequently intubated. CXR demonstrated RLL opacities. Confirmed bilateral septic pulmonary emboli, tricuspid valve endocarditis.  Required intubation and mechanical ventilation. Clinically progressed and was transferred out of ICU 11/10.  Reconsulted 11/13 for AMS, tachypnea and tachycardia.   Pertinent Medical History:  MSSA bacteremia MSSA PNA Lumbar osteomyelitis  Psoas abscess  Severe protein calorie malnutrition FTT in adult  Significant Hospital Events: Including procedures, antibiotic start and stop dates in addition to other pertinent events   11/2 [facility] ED w AMS. Intubated. Started on vanc mero. Txf request to [place] to [facility]  11/3 Extubated, developed respiratory distress requiring reintubation. Brief cardiac arrest during intubation.  11/3 Transthoracic echocardiogram > LVEF 40-45%, global hypokinesis, grade 1 diastolic dysfunction, normal RV function.  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  No mitral or aortic valve involvement 11/3 CT Chest, abdomen, pelvis >> numerous bilateral cavitary and solid nodules consistent with septic emboli, left lower lobe consolidation with a small pleural effusion, right basilar atelectasis.  Moderate free fluid in the pelvis.  Erosive changes at T5-T6, L2-L3, L4-L5 suggestive of discitis 11/3 CT Head >> no acute abnormality.   Opacified left maxillary sinus 11/10 Transferred out of ICU. 11/13 PCCM reconsulted for decreased mental status, tachycardia, tachypnea. Started diuretics 11/16 Poorly responsive, transferred back to ICU and intubated for the third time. Pressors started post intubation. Renal function worse. UOP poor.  11/20 NO acute events overnight, awake and following commands.  Patient and spouse met with palliative care and collectively decision was made to proceed with trach and PEG if needed 11/21 no acute events overnight, hemoglobin remained stable  Interim History/Subjective:  Eyes open stimuli, intermittently follows commands on ventilator  Objective:   Blood pressure 102/69, pulse 85, temperature (!) 97.5 F (36.4 C), temperature source Axillary, resp. rate (!) 24, height 6' (1.829 m), weight 72 kg, SpO2 99%.    Vent Mode: PRVC FiO2 (%):  [30 %-100 %] 30 % Set Rate:  [24 bmp] 24 bmp Vt Set:  [470 mL] 470 mL PEEP:  [5 cmH20] 5 cmH20 Pressure Support:  [12 cmH20] 12 cmH20 Plateau Pressure:  [13 cmH20-17 cmH20] 14 cmH20   Intake/Output Summary (Last 24 hours) at 05/03/2024 0918 Last data filed at 05/03/2024 0800 Gross per 24 hour  Intake 2548.96 ml  Output 1720 ml  Net 828.96 ml   Filed Weights   05/01/24 0717 05/02/24 0311 05/03/24 0231  Weight: 72.6 kg 72.6 kg 72 kg   Physical Examination: General: Acute on chronic ill-appearing adult male lying in bed on mechanical ventilation no acute distress HEENT: ETT, MM pink/moist, PERRL,  Neuro: Opens eyes to physical stimuli CV: s1s2 regular rate and rhythm, no murmur, rubs, or gallops,  PULM: Clear to auscultation bilaterally, no increased work of breathing, no added breath sounds GI: soft, bowel sounds active in all 4 quadrants, non-tender, non-distended, tolerating TF Extremities: warm/dry, no edema  Skin: no rashes or lesions  Resolved Problem List:  Postintubation hypotension  Assessment and Plan:   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 Last MRSA PCR was negative on 11/2; ID stopped Zyvox  11/16. MRSA swab negative. P: ID following, appreciate assistance Continue cefazolin  tentative plan for treatment 6 to 8 weeks  Acute hypoxic and hypercarbic respiratory failure 2/2 MSSA PNA, ARDS, mucous plugging, and septic pulmonary emboli -Deconditioning and generalized weakness are contributing factors along w/ ineffective airway clearance. POCUS 11/16 no evidence of effusion. P: Failed SBT 8 AM 11/21 due to decreased respiratory rate Continue ventilator support with lung protective strategies  Wean PEEP and FiO2 for sats greater than 90%. Head of bed elevated 30 degrees. Plateau pressures less than 30 cm H20.  Follow intermittent chest x-ray and ABG.   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 P: Maintain neuro protective measures; goal for eurothermia, euglycemia, eunatermia, normoxia, and PCO2 goal of 35-40 Nutrition and bowel  regimen Aspirations precautions  Add protocol as above Minimize sedation as able  Acute renal failure further c/b urinary retention req'd Urology placement 11/16 - Urology consulted 11/16 due to difficult Foley insertion on evaluation false tract identified but urology was able to place urethral catheter wire Hypernatremia P: Follow renal function  Monitor urine output Trend Bmet Avoid nephrotoxins, ensure adequate renal perfusion  Keep Foley in place until at least 11/26 per urology  Anemia, unknown etiology -1U PRBCs given 11/18 got Hgb 6.0, additional 1U PRBCs 11/19AM for Hgb 6.8. P: CBC Transfuse per protocol Hemoglobin goal greater than 7 Hemoglobin continues to slowly downtrend at this time benefit outweighs risk of continued prophylactic subcutaneous heparin   Elevated Alk phos  P: Intermittently trend LFTs  Protein calorie malnutrition  P: Appreciate RD's assistance Continue tube feeds  Stage 2 pressure ulcer sacrum, present on admission P: Wound care per WOC Relieving devices Optimize nutrition   Critical care time:  CRITICAL CARE Performed by: Nautia Lem D. Harris   Total critical care time: 38 minutes  Critical care time was exclusive of separately billable procedures and treating other patients.  Critical care was necessary to treat or prevent imminent or life-threatening deterioration.  Critical care was time spent personally by me on the following activities: development of treatment plan with patient and/or surrogate as well as nursing, discussions with consultants, evaluation of patient's response to treatment, examination of patient, obtaining history from patient or surrogate, ordering and performing treatments and interventions, ordering and review of laboratory studies, ordering and review of radiographic studies, pulse oximetry and re-evaluation of patient's condition.  Dellia Donnelly D. Arloa, NP-C [facility] Personal contact  information can be found on Amion  If no contact or response made please call [phone] 05/03/2024, 9:21 AM

## 2024-05-03 NOTE — Consult Note (Signed)
 [name] [name] [name] 1975-11-01  [id number].    Requesting MD: Neda, MD Chief Complaint/Reason for Consult: tracheostomy   HPI:  [name] [name] [name] is a 48 y/o M with past medical history of MSSA bacteremia, pneumonia, infective endocarditis, cervical through lumbar osteomyelitis, psoas abscess, protein calorie malnutrition, and substance abuse who presented to [facility] with altered mental status.  Noted to have poor airway protection and was intubated.  Diagnosed with right lower lobe opacities, septic pulmonary emboli, endocarditis, and was transferred to [facility] for critical care management.  He has been on IV antibiotics. He was extubated but was reintubated the same day due to to respiratory distress. Extubated again and then transferred out of the ICU on 11/10.  He decompensated and required transfer back to the ICU on 11/16 and required reintubation.  He is being followed by infectious disease for antibiotic management, cardiothoracic was consulted and determined he was not a candidate for angiovac, neurosurgery is following for his septic arthritis and osteomyelitis of the spine, he also has acute kidney injury/ATN and nephrology is following. currently in a c-collar.    Given persistent respiratory failure trauma surgery is consulted for consideration of tracheostomy.  He has already met with palliative for goals of care and decided that he does want a tracheostomy, as well as a PEG tube if necessary.  ROS: Review of Systems  All other systems reviewed and are negative.   No family history on file.  Past Medical History:  Diagnosis Date   Anxiety    Back pain    Fracture of rib of right side 01/26/2015   Hypertension     Past Surgical History:  Procedure Laterality Date   TEE WITHOUT CARDIOVERSION N/A 01/21/2014   Procedure: TRANSESOPHAGEAL ECHOCARDIOGRAM (TEE) with propofol ;  Surgeon: Dorn JULIANNA Ross, MD;  Location: [facility];  Service: Endoscopy;  Laterality: N/A;     Social History:  reports that he has been smoking cigarettes. He has never used smokeless tobacco. He reports current drug use. Drug: Heroin. He reports that he does not drink alcohol.  Allergies: No Known Allergies  Medications Prior to Admission  Medication Sig Dispense Refill   acetaminophen  (TYLENOL ) 500 MG tablet Take 500 mg by mouth every 6 (six) hours as needed for mild pain (pain score 1-3).     ALPRAZolam  (XANAX ) 1 MG tablet Take 1 mg by mouth in the morning, at noon, in the evening, and at bedtime.     lidocaine  (LMX) 4 % cream Apply 1 Application topically as needed (pain).     naproxen  sodium (ALEVE ) 220 MG tablet Take 220 mg by mouth daily as needed (pain).     Oxycodone  HCl 10 MG TABS Take 1 tablet by mouth in the morning, at noon, in the evening, and at bedtime.       Physical Exam: Blood pressure 119/86, pulse 82, temperature 99.9 F (37.7 C), temperature source Axillary, resp. rate (!) 22, height 6' (1.829 m), weight 72 kg, SpO2 100%. General: Ill-appearing, white [name] [name], with diffuse muscle wasting HEENT: head -normocephalic, atraumatic; Eyes: PERRLA, no conjunctival injection;  [place] J collar in place CV- RRR, normal S1/S2, no M/R/G, there is edema of the extremities Pulm- ventilated respirations Vent Mode: PRVC FiO2 (%):  [30 %] 30 % Set Rate:  [24 bmp] 24 bmp Vt Set:  [470 mL] 470 mL PEEP:  [5 cmH20] 5 cmH20 Pressure Support:  [12 cmH20] 12 cmH20 Plateau Pressure:  [13 cmH20-15 cmH20] 15 cmH20  Abd- soft, NT/ND, no hernias or masses, anasarca present GU-Foley draining clear yellow urine MSK- UE/LE symmetrical Neuro- CN II-XII grossly in tact, no paresthesias. Psych- Alert and Oriented x3 *** Skin: warm and dry, no rashes or lesions   Results for orders placed or performed during the hospital encounter of 04/14/24 (from the past 48 hours)  Glucose, capillary     Status: Abnormal   Collection Time: 05/01/24  7:18 PM  Result Value Ref Range    Glucose-Capillary 116 (H) 70 - 99 mg/dL    Comment: Glucose reference range applies only to samples taken after fasting for at least 8 hours.  Glucose, capillary     Status: Abnormal   Collection Time: 05/01/24 11:05 PM  Result Value Ref Range   Glucose-Capillary 122 (H) 70 - 99 mg/dL    Comment: Glucose reference range applies only to samples taken after fasting for at least 8 hours.  Glucose, capillary     Status: Abnormal   Collection Time: 05/02/24  3:10 AM  Result Value Ref Range   Glucose-Capillary 129 (H) 70 - 99 mg/dL    Comment: Glucose reference range applies only to samples taken after fasting for at least 8 hours.  Triglycerides     Status: None   Collection Time: 05/02/24  3:18 AM  Result Value Ref Range   Triglycerides 89 <150 mg/dL    Comment: Performed at [facility], [address], [place], [place] [address]  CBC     Status: Abnormal   Collection Time: 05/02/24  3:18 AM  Result Value Ref Range   WBC 7.1 4.0 - 10.5 K/uL   RBC 2.75 (L) 4.22 - 5.81 MIL/uL   Hemoglobin 7.9 (L) 13.0 - 17.0 g/dL   HCT 74.9 (L) 60.9 - 47.9 %   MCV 90.9 80.0 - 100.0 fL   MCH 28.7 26.0 - 34.0 pg   MCHC 31.6 30.0 - 36.0 g/dL   RDW 80.5 (H) 88.4 - 84.4 %   Platelets 107 (L) 150 - 400 K/uL   nRBC 0.0 0.0 - 0.2 %    Comment: Performed at [facility], [address] [place], [place] [address]  Basic metabolic panel     Status: Abnormal   Collection Time: 05/02/24  3:18 AM  Result Value Ref Range   Sodium 135 135 - 145 mmol/L   Potassium 4.1 3.5 - 5.1 mmol/L   Chloride 98 98 - 111 mmol/L   CO2 25 22 - 32 mmol/L   Glucose, Bld 126 (H) 70 - 99 mg/dL    Comment: Glucose reference range applies only to samples taken after fasting for at least 8 hours.   BUN 97 (H) 6 - 20 mg/dL   Creatinine, Ser 6.75 (H) 0.61 - 1.24 mg/dL   Calcium  7.8 (L) 8.9 - 10.3 mg/dL   GFR, Estimated 23 (L) >60 mL/min    Comment: (NOTE) Calculated using the CKD-EPI Creatinine Equation (2021)    Anion  gap 12 5 - 15    Comment: Performed at [facility], [address], [place], [place] [address]  Glucose, capillary     Status: None   Collection Time: 05/02/24  7:17 AM  Result Value Ref Range   Glucose-Capillary 91 70 - 99 mg/dL    Comment: Glucose reference range applies only to samples taken after fasting for at least 8 hours.  Glucose, capillary     Status: Abnormal   Collection Time: 05/02/24 11:02 AM  Result Value Ref Range   Glucose-Capillary 107 (H) 70 - 99 mg/dL    Comment: Glucose reference range applies only to samples taken after fasting for at least 8 hours.  Culture, BAL-quantitative w Gram Stain     Status: None (Preliminary result)   Collection Time: 05/02/24 11:06 AM   Specimen: Bronchoalveolar Lavage; Respiratory  Result Value Ref Range   Specimen Description BRONCHIAL ALVEOLAR LAVAGE    Special Requests Normal    Gram Stain      WBC PRESENT, PREDOMINANTLY PMN YEAST WITH PSEUDOHYPHAE CRITICAL RESULT CALLED TO, READ BACK BY AND VERIFIED WITH: RN JESSICA D ON 05/02/24 @ 2032 BY DRT    Culture      CULTURE REINCUBATED FOR BETTER GROWTH Performed at [facility], [address] [place], [place] [address]    Report Status PENDING   Glucose, capillary     Status: Abnormal   Collection Time: 05/02/24  3:16 PM  Result Value Ref Range   Glucose-Capillary 125 (H) 70 - 99 mg/dL    Comment: Glucose reference range applies only to samples taken after fasting for at least 8 hours.  Culture, blood (Routine X 2) w Reflex to ID Panel     Status: None (Preliminary result)   Collection Time: 05/02/24  7:52 PM   Specimen: BLOOD  Result Value Ref Range   Specimen Description BLOOD SITE NOT SPECIFIED    Special Requests      BOTTLES DRAWN AEROBIC ONLY Blood Culture results may not be optimal due to an inadequate volume of blood received in culture bottles   Culture      NO GROWTH < 12 HOURS Performed at [facility], [address], [place], [place]  [address]    Report Status PENDING   Culture, blood (Routine X 2) w Reflex to ID Panel     Status: None (Preliminary result)   Collection Time: 05/02/24  7:57 PM   Specimen: BLOOD  Result Value Ref Range   Specimen Description BLOOD SITE NOT SPECIFIED    Special Requests      BOTTLES DRAWN AEROBIC AND ANAEROBIC Blood Culture results may not be optimal due to an inadequate volume of blood received in culture bottles   Culture      NO GROWTH < 12 HOURS Performed at [facility], [address] [place], [place] [address]    Report Status PENDING   Glucose, capillary     Status: Abnormal   Collection Time: 05/02/24  8:03 PM  Result Value Ref Range   Glucose-Capillary 134 (H) 70 - 99 mg/dL    Comment: Glucose reference range applies only to samples taken after fasting for at least 8 hours.  Glucose, capillary     Status: Abnormal   Collection Time: 05/03/24 12:04 AM  Result Value Ref Range   Glucose-Capillary 126 (H) 70 - 99 mg/dL    Comment: Glucose reference range applies only to samples taken after fasting for at least 8 hours.  CBC with Differential/Platelet     Status: Abnormal   Collection Time: 05/03/24  3:[name] AM  Result Value Ref Range   WBC 4.6 4.0 - 10.5 K/uL   RBC 2.57 (L) 4.22 - 5.81 MIL/uL   Hemoglobin 7.4 (L) 13.0 - 17.0 g/dL   HCT 76.4 (L) 60.9 - 47.9 %   MCV 91.4 80.0 - 100.0 fL   MCH 28.8 26.0 - 34.0 pg   MCHC 31.5 30.0 - 36.0 g/dL  RDW 18.7 (H) 11.5 - 15.5 %   Platelets 97 (L) 150 - 400 K/uL    Comment: SPECIMEN CHECKED FOR CLOTS REPEATED TO VERIFY PLATELET COUNT CONFIRMED BY SMEAR Immature Platelet Fraction may be clinically indicated, consider ordering this additional test [id number]    nRBC 0.0 0.0 - 0.2 %   Neutrophils Relative % 77 %   Neutro Abs 3.5 1.7 - 7.7 K/uL   Lymphocytes Relative 8 %   Lymphs Abs 0.4 (L) 0.7 - 4.0 K/uL   Monocytes Relative 13 %   Monocytes Absolute 0.6 0.1 - 1.0 K/uL   Eosinophils Relative 2 %   Eosinophils Absolute 0.1  0.0 - 0.5 K/uL   Basophils Relative 0 %   Basophils Absolute 0.0 0.0 - 0.1 K/uL   WBC Morphology MORPHOLOGY UNREMARKABLE    RBC Morphology MORPHOLOGY UNREMARKABLE    Smear Review Normal platelet morphology    Immature Granulocytes 0 %   Abs Immature Granulocytes 0.02 0.00 - 0.07 K/uL    Comment: Performed at [facility], [address] [place], [place] [address]  Basic metabolic panel with GFR     Status: Abnormal   Collection Time: 05/03/24  3:[name] AM  Result Value Ref Range   Sodium 137 135 - 145 mmol/L   Potassium 4.7 3.5 - 5.1 mmol/L   Chloride 100 98 - 111 mmol/L   CO2 21 (L) 22 - 32 mmol/L   Glucose, Bld 119 (H) 70 - 99 mg/dL    Comment: Glucose reference range applies only to samples taken after fasting for at least 8 hours.   BUN 99 (H) 6 - 20 mg/dL   Creatinine, Ser 7.03 (H) 0.61 - 1.24 mg/dL   Calcium  8.0 (L) 8.9 - 10.3 mg/dL   GFR, Estimated 26 (L) >60 mL/min    Comment: (NOTE) Calculated using the CKD-EPI Creatinine Equation (2021)    Anion gap 16 (H) 5 - 15    Comment: Performed at [facility], [address], [place], [place] [address]  Magnesium     Status: None   Collection Time: 05/03/24  3:[name] AM  Result Value Ref Range   Magnesium 2.0 1.7 - 2.4 mg/dL    Comment: Performed at [facility], [address], [place], [place] [address]  Phosphorus     Status: Abnormal   Collection Time: 05/03/24  3:[name] AM  Result Value Ref Range   Phosphorus 7.4 (H) 2.5 - 4.6 mg/dL    Comment: Performed at [facility], [address], [place], [place] [address]  Protime-INR     Status: Abnormal   Collection Time: 05/03/24  3:[name] AM  Result Value Ref Range   Prothrombin Time 16.4 (H) 11.4 - 15.2 seconds   INR 1.3 (H) 0.8 - 1.2    Comment: (NOTE) INR goal varies based on device and disease states. Performed at [facility], [address] [place], [place] [address]   APTT     Status: None   Collection Time: 05/03/24  3:[name] AM  Result Value Ref  Range   aPTT 34 24 - 36 seconds    Comment: Performed at [facility], [address] [place], [place] [address]  Glucose, capillary     Status: Abnormal   Collection Time: 05/03/24  3:[name] AM  Result Value Ref Range   Glucose-Capillary 125 (H) 70 - 99 mg/dL    Comment: Glucose reference range applies only to samples taken after  fasting for at least 8 hours.  Glucose, capillary     Status: Abnormal   Collection Time: 05/03/24  8:41 AM  Result Value Ref Range   Glucose-Capillary 112 (H) 70 - 99 mg/dL    Comment: Glucose reference range applies only to samples taken after fasting for at least 8 hours.  Glucose, capillary     Status: Abnormal   Collection Time: 05/03/24 12:19 PM  Result Value Ref Range   Glucose-Capillary 103 (H) 70 - 99 mg/dL    Comment: Glucose reference range applies only to samples taken after fasting for at least 8 hours.   CT CHEST ABDOMEN PELVIS WO CONTRAST Result Date: 05/02/2024 CLINICAL DATA:  Bacteremia and discitis. EXAM: CT CHEST, ABDOMEN AND PELVIS WITHOUT CONTRAST TECHNIQUE: Multidetector CT imaging of the chest, abdomen and pelvis was performed following the standard protocol without IV contrast. RADIATION DOSE REDUCTION: This exam was performed according to the departmental dose-optimization program which includes automated exposure control, adjustment of the mA and/or kV according to patient size and/or use of iterative reconstruction technique. COMPARISON:  CT dated 04/15/2024. FINDINGS: Evaluation of this exam is limited in the absence of intravenous contrast and anasarca as well as due to streak artifact caused by patient's arms. CT CHEST FINDINGS Cardiovascular: There is no cardiomegaly or pericardial effusion. There is hypoattenuation of the cardiac blood pool suggestive of anemia. Clinical correlation is recommended. The thoracic aorta and central pulmonary arteries are grossly unremarkable. Mediastinum/Nodes: Evaluation of the hilar lymph nodes is very  limited in the absence of intravenous contrast and due to consolidative changes of the lungs. An enteric tube noted in the esophagus. No mediastinal fluid collection. Right-sided PICC with tip over central SVC. Lungs/Pleura: There is mucous impaction of the bronchus intermedius. A large area of consolidation with volume loss involving the right middle and right lower lobe, new since the prior CT. Subsegmental consolidation of the left lower lobe. Additional bilateral confluent airspace consolidation and several scattered cavitary lesions noted. Overall significant progression of consolidative changes compared to prior CT. Small bilateral pleural effusions with no pneumothorax. Endotracheal tube approximately 5.5 cm above the carina. Musculoskeletal: There is diffuse subcutaneous edema and anasarca. Erosive changes at T5-T6 with endplate irregularity as seen previously and may represent osteomyelitis/discitis. CT ABDOMEN PELVIS FINDINGS No intra-abdominal free air.  Small ascites. Hepatobiliary: The liver is unremarkable. No biliary dilatation. The gallbladder is unremarkable. Pancreas: The pancreas is suboptimally evaluated but grossly unremarkable. Spleen: Normal in size without focal abnormality. Adrenals/Urinary Tract: The adrenal glands unremarkable. There is no hydronephrosis or nephrolithiasis on either side. Small right renal cyst. The urinary bladder is decompressed around a Foley catheter. Stomach/Bowel: Enteric tube with tip in the distal stomach. There is no bowel obstruction. The appendix is unremarkable. Vascular/Lymphatic: Mild aortoiliac atherosclerotic disease. The IVC is unremarkable. No portal venous gas. No obvious adenopathy. Reproductive: The prostate gland is grossly unremarkable. Other: Diffuse subcutaneous edema and anasarca. Musculoskeletal: There is erosive changes and endplate irregularity at L2-L3 and L4-L5 concerning for osteomyelitis/discitis. IMPRESSION: 1. Significant progression of  consolidative changes of the lungs with several scattered cavitary lesions. 2. Mucous impaction of the bronchus intermedius with complete collapse of the right middle and right lower lobes. 3. Small bilateral pleural effusions. 4. Erosive changes at T5-T6, L2-L3, and L4-L5 concerning for osteomyelitis/discitis. 5. Small ascites and diffuse subcutaneous edema and anasarca, progressed since the prior CT. 6.  Aortic Atherosclerosis (ICD10-I70.0). Electronically Signed   By: Vanetta Chou M.D.   On: 05/02/2024 20:28  Assessment/Plan Acute hypoxic respiratory failure 48 year old [name] [name] with multiple medical issues listed below including persistent hypoxemic hypercarbic respiratory failure related to MSSA pneumonia, pleural effusions, and septic emboli.  Will tentatively plan to proceed with tracheostomy early next week.  Following trach placement and vent weaning could consider speech therapy evaluation prior to proceeding with PEG.   FEN -Osmolite tube feeds at goal, hold Sunday night in anticipation of procedure Monday morning VTE -SCDs, subcu heparin  ID -Ancef  -per ID Admit -ICU  Infectious endocarditis MSSA pneumonia MSSA osteomyelitis of the spine Acute renal failure Polysubstance abuse Anemia Protein calorie malnutrition Pressure injury of the sacrum   I reviewed {Reviewed data:26882::last 24 h vitals and pain scores,last 48 h intake and output,last 24 h labs and trends,last 24 h imaging results}.  Almarie GORMAN Pringle, [facility] Surgery 05/03/2024, 3:54 PM Please see Amion for pager number during day hours 7:00am-4:30pm or 7:00am -11:30am on weekends

## 2024-05-03 NOTE — Progress Notes (Signed)
 [facility]  Date of Admission:  04/14/2024     Reason for Follow Up: Disseminated MSSA infection  Total days of antibiotics 19         ASSESSMENT:  [name] is a 48 year old Caucasian gentleman admitted with MSSA pneumonia, bacteremia, tricuspid valve endocarditis, psoas abscess, and lumbar osteomyelitis.  Admitted on 11/2 with altered mental status and inability to protect airway requiring intubation.  Now extubated.  Neck and back imaging with multiple areas of osteomyelitis/discitis and abnormal widening of the atlantodental and basion-dens intervals secondary to acute septic arthritis. No surgical intervention planned.  Completed 5 days of piperacillin /tazobactam for suspected pneumonia and transition back to cefazolin .  [name]'s CT reviewed with continued large bureden of infection with cavitary, likely septic, lesions, discitis/osteomyelitis and collapse of the right middle and lower lung lobes. Blood cultures from 05/02/24 are without growth to date. BAL culture re-incubated for better growth. More alert and able to communicate while on the ventilator and failed SBT. Discussed plan of care to continue with current dose of Cefazolin  and monitor for any new fevers or oxygen requirement changes. Monitor blood and BAL cultures. Continue standard/universal precautions. Palliative Care following with Goals of Care and continued full scope. Remaining medical and supportive care per PCCM.   PLAN:  Continue current dose of Cefazolin .  Monitor blood and BAL cultures and adjust antibiotics as appropriate. Monitor fever curve and oxygen requirements.  Standard/universal precautions. Goals of care per Palliative Care Remaining medical and supportive care per PCCM.   Principal Problem:   Sepsis (HCC) Active Problems:   MSSA bacteremia   Acute respiratory failure with hypoxia and hypercapnia (HCC)   Vertebral osteomyelitis (HCC)   Severe sepsis (HCC)    Psoas abscess (HCC)   Endocarditis of tricuspid valve   Acute septic pulmonary embolism without acute cor pulmonale (HCC)    ALPRAZolam   0.5 mg Per Tube Q8H   Chlorhexidine  Gluconate Cloth  6 each Topical Daily   feeding supplement (PROSource TF20)  60 mL Per Tube Daily   folic acid   1 mg Per Tube Daily   free water   200 mL Per Tube Q6H   gabapentin   100 mg Per Tube Q12H   guaiFENesin   5 mL Per Tube Q4H   heparin  injection (subcutaneous)  5,000 Units Subcutaneous Q8H   lidocaine   1 patch Transdermal Q24H   midodrine   5 mg Per Tube TID WC   multivitamin with minerals  1 tablet Per Tube Daily   mouth rinse  15 mL Mouth Rinse Q2H   oxyCODONE   5 mg Per Tube Q6H    SUBJECTIVE:  Afebrile overnight with no acute events. Remains intubated and alert communicating on through clipboard. Having back pain.   No Known Allergies   Review of Systems: Review of Systems  Constitutional:  Negative for chills, fever and weight loss.  Respiratory:  Negative for cough, shortness of breath and wheezing.   Cardiovascular:  Negative for chest pain and leg swelling.  Musculoskeletal:  Positive for back pain.  Skin:  Negative for rash.      OBJECTIVE: Vitals:   05/03/24 0830 05/03/24 0844 05/03/24 1106 05/03/24 1220  BP: 102/69  125/89   Pulse:   92   Resp: (!) 24  (!) 25   Temp:  (!) 97.5 F (36.4 C)  98.2 F (36.8 C)  TempSrc:  Axillary  Axillary  SpO2:   98%   Weight:      Height:  Body mass index is 21.53 kg/m.  Physical Exam Constitutional:      General: He is not in acute distress.    Appearance: He is well-developed.  Cardiovascular:     Rate and Rhythm: Normal rate and regular rhythm.     Heart sounds: Normal heart sounds.  Pulmonary:     Effort: Pulmonary effort is normal.     Breath sounds: Normal breath sounds.  Skin:    General: Skin is warm and dry.  Neurological:     Mental Status: He is alert and oriented to person, place, and time.     Lab  Results Lab Results  Component Value Date   WBC 4.6 05/03/2024   HGB 7.4 (L) 05/03/2024   HCT 23.5 (L) 05/03/2024   MCV 91.4 05/03/2024   PLT 97 (L) 05/03/2024    Lab Results  Component Value Date   CREATININE 2.96 (H) 05/03/2024   BUN 99 (H) 05/03/2024   NA 137 05/03/2024   K 4.7 05/03/2024   CL 100 05/03/2024   CO2 21 (L) 05/03/2024    Lab Results  Component Value Date   ALT <5 04/27/2024   AST 14 (L) 04/27/2024   ALKPHOS 109 04/27/2024   BILITOT 1.5 (H) 04/27/2024     Microbiology: Recent Results (from the past 240 hours)  MRSA Next Gen by PCR, Nasal     Status: None   Collection Time: 04/28/24  1:07 PM   Specimen: Nasal Mucosa; Nasal Swab  Result Value Ref Range Status   MRSA by PCR Next Gen NOT DETECTED NOT DETECTED Final    Comment: (NOTE) The GeneXpert MRSA Assay (FDA approved for NASAL specimens only), is one component of a comprehensive MRSA colonization surveillance program. It is not intended to diagnose MRSA infection nor to guide or monitor treatment for MRSA infections. Test performance is not FDA approved in patients less than 71 years old. Performed at [facility], [address], [place], [place] [address]   Culture, Respiratory w Gram Stain     Status: None   Collection Time: 04/28/24  4:02 PM   Specimen: Tracheal Aspirate; Respiratory  Result Value Ref Range Status   Specimen Description TRACHEAL ASPIRATE  Final   Special Requests NONE  Final   Gram Stain   Final    NO WBC SEEN FEW YEAST WITH PSEUDOHYPHAE FEW BUDDING YEAST SEEN Performed at [facility], [address] [place], [place] [address]    Culture   Final    MODERATE CANDIDA TROPICALIS MODERATE CANDIDA ALBICANS    Report Status 04/30/2024 FINAL  Final  Culture, BAL-quantitative w Gram Stain     Status: None (Preliminary result)   Collection Time: 05/02/24 11:06 AM   Specimen: Bronchoalveolar Lavage; Respiratory  Result Value Ref Range Status   Specimen  Description BRONCHIAL ALVEOLAR LAVAGE  Final   Special Requests Normal  Final   Gram Stain   Final    WBC PRESENT, PREDOMINANTLY PMN YEAST WITH PSEUDOHYPHAE CRITICAL RESULT CALLED TO, READ BACK BY AND VERIFIED WITH: RN JESSICA D ON 05/02/24 @ 2032 BY DRT    Culture   Final    CULTURE REINCUBATED FOR BETTER GROWTH Performed at [facility], [address] [place], [place] [address]    Report Status PENDING  Incomplete  Culture, blood (Routine X 2) w Reflex to ID Panel     Status: None (Preliminary result)   Collection Time: 05/02/24  7:52 PM   Specimen:  BLOOD  Result Value Ref Range Status   Specimen Description BLOOD SITE NOT SPECIFIED  Final   Special Requests   Final    BOTTLES DRAWN AEROBIC ONLY Blood Culture results may not be optimal due to an inadequate volume of blood received in culture bottles   Culture   Final    NO GROWTH < 12 HOURS Performed at [facility], [address], [place], [place] [address]    Report Status PENDING  Incomplete  Culture, blood (Routine X 2) w Reflex to ID Panel     Status: None (Preliminary result)   Collection Time: 05/02/24  7:57 PM   Specimen: BLOOD  Result Value Ref Range Status   Specimen Description BLOOD SITE NOT SPECIFIED  Final   Special Requests   Final    BOTTLES DRAWN AEROBIC AND ANAEROBIC Blood Culture results may not be optimal due to an inadequate volume of blood received in culture bottles   Culture   Final    NO GROWTH < 12 HOURS Performed at [facility], [address] [place], [place] [address]    Report Status PENDING  Incomplete     Cathlyn July, NP [facility]  05/03/2024  1:33 PM

## 2024-05-04 LAB — COMPREHENSIVE METABOLIC PANEL WITH GFR
ALT: <5 U/L (ref 0–44)
AST: 11 U/L — ABNORMAL LOW (ref 15–41)
Albumin: 1.7 g/dL — ABNORMAL LOW (ref 3.5–5.0)
Alkaline Phosphatase: 83 U/L (ref 38–126)
Anion gap: 14 (ref 5–15)
BUN: 103 mg/dL — ABNORMAL HIGH (ref 6–20)
CO2: 22 mmol/L (ref 22–32)
Calcium: 7.9 mg/dL — ABNORMAL LOW (ref 8.9–10.3)
Chloride: 97 mmol/L — ABNORMAL LOW (ref 98–111)
Creatinine, Ser: 3.08 mg/dL — ABNORMAL HIGH (ref 0.61–1.24)
GFR, Estimated: 24 mL/min — ABNORMAL LOW (ref >60)
Glucose, Bld: 94 mg/dL (ref 70–99)
Potassium: 4.9 mmol/L (ref 3.5–5.1)
Sodium: 133 mmol/L — ABNORMAL LOW (ref 135–145)
Total Bilirubin: 0.7 mg/dL (ref 0.0–1.2)
Total Protein: 6.5 g/dL (ref 6.5–8.1)

## 2024-05-04 LAB — BASIC METABOLIC PANEL WITH GFR
Anion gap: 16 — ABNORMAL HIGH (ref 5–15)
BUN: 104 mg/dL — ABNORMAL HIGH (ref 6–20)
CO2: 19 mmol/L — ABNORMAL LOW (ref 22–32)
Calcium: 8.2 mg/dL — ABNORMAL LOW (ref 8.9–10.3)
Chloride: 99 mmol/L (ref 98–111)
Creatinine, Ser: 3.04 mg/dL — ABNORMAL HIGH (ref 0.61–1.24)
GFR, Estimated: 25 mL/min — ABNORMAL LOW (ref >60)
Glucose, Bld: 106 mg/dL — ABNORMAL HIGH (ref 70–99)
Potassium: 5.6 mmol/L — ABNORMAL HIGH (ref 3.5–5.1)
Sodium: 134 mmol/L — ABNORMAL LOW (ref 135–145)

## 2024-05-04 LAB — GLUCOSE, CAPILLARY
Glucose-Capillary: 112 mg/dL — ABNORMAL HIGH (ref 70–99)
Glucose-Capillary: 112 mg/dL — ABNORMAL HIGH (ref 70–99)
Glucose-Capillary: 131 mg/dL — ABNORMAL HIGH (ref 70–99)
Glucose-Capillary: 131 mg/dL — ABNORMAL HIGH (ref 70–99)
Glucose-Capillary: 133 mg/dL — ABNORMAL HIGH (ref 70–99)
Glucose-Capillary: 90 mg/dL (ref 70–99)

## 2024-05-04 LAB — CBC WITH DIFFERENTIAL/PLATELET
Abs Immature Granulocytes: 0.04 K/uL (ref 0.00–0.07)
Basophils Absolute: 0 K/uL (ref 0.0–0.1)
Basophils Relative: 1 %
Eosinophils Absolute: 0.2 K/uL (ref 0.0–0.5)
Eosinophils Relative: 3 %
HCT: 25.3 % — ABNORMAL LOW (ref 39.0–52.0)
Hemoglobin: 7.9 g/dL — ABNORMAL LOW (ref 13.0–17.0)
Immature Granulocytes: 1 %
Lymphocytes Relative: 7 %
Lymphs Abs: 0.5 K/uL — ABNORMAL LOW (ref 0.7–4.0)
MCH: 28.5 pg (ref 26.0–34.0)
MCHC: 31.2 g/dL (ref 30.0–36.0)
MCV: 91.3 fL (ref 80.0–100.0)
Monocytes Absolute: 0.8 K/uL (ref 0.1–1.0)
Monocytes Relative: 12 %
Neutro Abs: 5 K/uL (ref 1.7–7.7)
Neutrophils Relative %: 76 %
Platelets: 125 K/uL — ABNORMAL LOW (ref 150–400)
RBC: 2.77 MIL/uL — ABNORMAL LOW (ref 4.22–5.81)
RDW: 18.6 % — ABNORMAL HIGH (ref 11.5–15.5)
WBC: 6.5 K/uL (ref 4.0–10.5)
nRBC: 0 % (ref 0.0–0.2)

## 2024-05-04 MED ORDER — CLONAZEPAM 0.5 MG PO TABS
0.5000 mg | ORAL_TABLET | Freq: Two times a day (BID) | ORAL | Status: DC
  Administered 2024-05-04 (×2): 0.5 mg
  Filled 2024-05-04 (×2): qty 1

## 2024-05-04 MED ORDER — OXYCODONE HCL 5 MG PO TABS
10.0000 mg | ORAL_TABLET | Freq: Four times a day (QID) | ORAL | Status: DC
Start: 2024-05-04 — End: 2024-05-10
  Administered 2024-05-04 – 2024-05-10 (×23): 10 mg
  Filled 2024-05-04 (×24): qty 2

## 2024-05-04 NOTE — Plan of Care (Signed)
  Problem: Clinical Measurements: Goal: Will remain free from infection Outcome: Progressing   Problem: Clinical Measurements: Goal: Diagnostic test results will improve Outcome: Progressing   Problem: Clinical Measurements: Goal: Respiratory complications will improve Outcome: Progressing   Problem: Nutrition: Goal: Adequate nutrition will be maintained Outcome: Progressing   Problem: Skin Integrity: Goal: Risk for impaired skin integrity will decrease Outcome: Progressing   Problem: Safety: Goal: Ability to remain free from injury will improve Outcome: Progressing    Plan of care, assessment, monitoring, treatment, and intervention (s) ongoing, see MAR see flowsheet

## 2024-05-04 NOTE — Progress Notes (Signed)
 NAME:  [name] [name], MRN:  [id number], DOB:  09-24-1975, LOS: 20 ADMISSION DATE:  04/14/2024, CONSULTATION DATE:  04/14/2024 REFERRING MD:  [place] - [facility] EDP, CHIEF COMPLAINT:  AMS  History of Present Illness:  48 year old man who presented to [facility] ED 11/2 with AMS. PMHx significant for MSSA bacteremia, MSSA PNA, infective endocarditis, cervical through lumbar osteomyelitis, psoas abscess, severe protein calorie malnutrition/FTT, substance abuse. Recent admission 10/2 - 10/13 for sepsis, widespread MSSA.  Arrived to [facility] ED covered in feces, altered and with poor airway protection; he was subsequently intubated. CXR demonstrated RLL opacities. Confirmed bilateral septic pulmonary emboli, tricuspid valve endocarditis.  Required intubation and mechanical ventilation. Clinically progressed and was transferred out of ICU 11/10.  Reconsulted 11/13 for AMS, tachypnea and tachycardia.   Pertinent Medical History:  MSSA bacteremia MSSA PNA Lumbar osteomyelitis  Psoas abscess  Severe protein calorie malnutrition FTT in adult  Significant Hospital Events: Including procedures, antibiotic start and stop dates in addition to other pertinent events   11/2 [facility] ED w AMS. Intubated. Started on vanc mero. Txf request to [place] to [facility]  11/3 Extubated, developed respiratory distress requiring reintubation. Brief cardiac arrest during intubation.  11/3 Transthoracic echocardiogram > LVEF 40-45%, global hypokinesis, grade 1 diastolic dysfunction, normal RV function.  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  No mitral or aortic valve involvement 11/3 CT Chest, abdomen, pelvis >> numerous bilateral cavitary and solid nodules consistent with septic emboli, left lower lobe consolidation with a small pleural effusion, right basilar atelectasis.  Moderate free fluid in the pelvis.  Erosive changes at T5-T6, L2-L3, L4-L5 suggestive of discitis 11/3 CT Head >> no acute abnormality.   Opacified left maxillary sinus 11/10 Transferred out of ICU. 11/13 PCCM reconsulted for decreased mental status, tachycardia, tachypnea. Started diuretics 11/16 Poorly responsive, transferred back to ICU and intubated for the third time. Pressors started post intubation. Renal function worse. UOP poor.  11/20 NO acute events overnight, awake and following commands.  Patient and spouse met with palliative care and collectively decision was made to proceed with trach and PEG if needed 11/21 no acute events overnight, hemoglobin remained stable  Interim History/Subjective:  Awake alert interactive Able to communicate by writing on a board Still having a lot of pain especially around his neck  Objective:   Blood pressure 119/80, pulse 77, temperature (!) 96.8 F (36 C), temperature source Axillary, resp. rate (!) 25, height 6' (1.829 m), weight 67.5 kg, SpO2 100%.    Vent Mode: PRVC FiO2 (%):  [30 %] 30 % Set Rate:  [24 bmp] 24 bmp Vt Set:  [470 mL] 470 mL PEEP:  [5 cmH20] 5 cmH20 Plateau Pressure:  [12 cmH20-15 cmH20] 15 cmH20   Intake/Output Summary (Last 24 hours) at 05/04/2024 0947 Last data filed at 05/04/2024 0800 Gross per 24 hour  Intake 2608.2 ml  Output 1700 ml  Net 908.2 ml   Filed Weights   05/02/24 0311 05/03/24 0231 05/04/24 0500  Weight: 72.6 kg 72 kg 67.5 kg   Physical Examination: General: Acute on chronically ill-appearing  HEENT: Moist oral mucosa, endotracheal tube in place Neuro: Awake alert interactive, good strength bilateral upper extremity CV: S1-S2 appreciated with no murmur PULM: Some rhonchi GI: Soft, bowel sounds appreciated Extremities: Skin is warm and dry Skin: no rashes or lesions  I reviewed last 24 h vitals and pain scores, last 48 h intake and output, last 24 h labs and trends, and last  24 h imaging results.  Resolved Problem List:  Postintubation hypotension  Assessment and Plan:   MSSA infective endocarditis of the tricuspid  valve MSSA lumbar osteomyelitis Recent psoas abscess  MSSA septic pulmonary emboli C-spine septic arthritis -ID continues to follow -Zyvox  stopped 11/16 -Continue cefazolin  with treatment plan for 6 to 8 weeks -Cultures on 05/02/2024 negative to date  Acute hypoxic and hypercapnic respiratory failure secondary to MSSA pneumonia, ARDS, mucous plugging Septic pulmonary emboli - Generalized deconditioning, no significant pleural effusion on POCUS at bedside on 11/16 - Has been failing SBT with low volumes and high respiratory rate - Continue to wean as tolerated Head of the bed elevated to 30 degrees Plateau pressures less than 30 - Intermittent chest x-rays - VAP bundle in place  Airway management - Consulted trauma service for tracheostomy tube placement -Has been intubated 3 times  HFrEF - EF of 40 to 45% with global hypokinesis of the left ventricle and grade 1 diastolic dysfunction 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 - Continue telemetry - Continue with dynamic monitoring and support - Home medications on hold  Metabolic encephalopathy secondary to hypercarbia On opiates for pain management - History of substance abuse and chronic anxiety - Neuroprotective measures- normothermia, euglycemia, HOB greater than 30, head in neutral alignment, normocapnia, normoxia - Pain management - He is on Neurontin  100 manage he did not get anything else from him okay I increased his oxy to 10 from 5 lets try and wean him down on Precedex  unguinal start maybe Klonopin  0.5 twice a day yeah - Increase oxycodone  to 10 every 6 - On as needed fentanyl  - Add Klonopin  0.5 twice daily - Wean down Precedex   Acute renal failure Urine retention - Continue to follow renal output - Keep Foley in place till 11/26 per urology - Avoid nephrotoxic medications  Chronic anemia - Will transfuse per protocol -Continue management  Elevated alkaline phosphatase intermittent  LFTs  Protein calorie malnutrition Continue tube feeds  Stage II pressure ulcer sacrum, present on admission - Relieving devices - Optimize nutrition Wound care  The patient is critically ill with multiple organ systems failure and requires high complexity decision making for assessment and support, frequent evaluation and titration of therapies, application of advanced monitoring technologies and extensive interpretation of multiple databases. Critical Care Time devoted to patient care services described in this note independent of APP/resident time (if applicable)  is 32 minutes.   Jennet Epley MD [place] [facility] Personal pager: See Amion If unanswered, please page CCM On-call: #[phone]

## 2024-05-04 NOTE — Progress Notes (Signed)
 Palliative Medicine Inpatient Follow Up Note   HPI: [name] is a 48 yo male with past medical history significant og substance abuse, recent hospitalization for sepsis r/t MSSA bacteremia with septic PNA and PE which he left AMA failing to complete recommended ABTs.  Admitted on 04/14/2024 with sepsis d/t MSSA bacteremia, complicated due to findings of mobile vegetation on atrial valve.  According to CVTS, he is not a candidate for surgical interventions for his infective endocarditis.   Worth to note that he has been intubated 3 times, Had PEA cardiac arrest on 11/3 with 4 minutes of CPR. Last intubation was 11/16. MRI this admission shows septic arthritis at skull base, T5-T6 osteomyelitis/discitis.   Currently remains intubated, on Precedex  infusion at 0.7 mcg/kg/hr.  19 days admitted, with admit to ICU care 04/28/2024.   Today's Discussion 05/04/2024   Chart reviewed inclusive of vital signs, progress notes. VS from 05/04/24 0830 reviewed: BP 119/80, MAP 92, PR 77 and R25. Reviewed progress note from infectious disease 05/03/24, plan is to continue with current dose of cefazolin  for disseminated MSSA bacteremia.  Reviewed progress note from critical care NP Harris from 05/03/24, current treatment in place for multiorgan system failure. SLP note from 05/03/24 reviewed, per PCCM, patient likely to undergo trach in the next 24-48 hours, post trach will better allow clear patient-PMT discussion/communication on GOC including addressing code status as patient appears in capacity to make healthcare decisions at this time.   I went to visit patient at bedside, no family members present. Patient appears ill-appearing. Intubated, on mechanical vent support. Also noted with ongoing artifical feeding through NGT. Patient appears alert, responding to queries although with marked limitations due to being intubated. He uses pen and paper to communicate. He denies pain and discomfort.   I shared that the  current plan is for tracheostomy placement. I shared with patient the further GOC conversation will be done once a trach is established. Patient replied with a nod.  Patient appears tired and proceeded with going back to sleep.   Received report from ICU RN, no significant event thus far. Planning for tracheostomy placement on Monday, May 06, 2024.   Treatment plan unchanged. Continue with full code, full scope of treatment.   Created space and opportunity for patient to explore thoughts feelings and fears regarding current medical situation.  Patient and her family face treatment option decisions, advanced directive decisions and anticipatory care needs.   Questions and concerns addressed   Palliative Support Provided.   Objective Assessment: Vital Signs Vitals:   05/04/24 0815 05/04/24 0830  BP:  119/80  Pulse: (!) 59 77  Resp: 17 (!) 25  Temp:    SpO2: 98% 100%    Intake/Output Summary (Last 24 hours) at 05/04/2024 0947 Last data filed at 05/04/2024 0800 Gross per 24 hour  Intake 2608.2 ml  Output 1700 ml  Net 908.2 ml   Last Weight  Most recent update: 05/04/2024  5:41 AM    Weight  67.5 kg (148 lb 13 oz)             Gen:  Intubated, on vent support, HEENT: moist mucous membranes CV: Regular rate and rhythm, no murmurs rubs or gallops PULM: clear to auscultation bilaterally. No wheezes/rales/rhonchi ABD: soft/nontender, NGT in place to support artificial feeding.  EXT: No edema Neuro: Alert and oriented x3  SUMMARY OF RECOMMENDATIONS    Continue with full code, full scope, plan for tracheostomy tube placement on Monday, 05/06/2024 PMT will  continue to follow and assist with GOC discussion as needed.  Symptom Management --- Per PCCM team  I personally spent a total of 35 minutes in the care of the patient today including preparing to see the patient, getting/reviewing separately obtained history, counseling and educating, referring and communicating with  other health care professionals, documenting clinical information in the EHR, independently interpreting results, communicating results, and coordinating care.   ______________________________________________________________________________________ Kathlyne Bolder NP-C [place] Palliative Medicine Team Team Cell Phone: [phone] Please utilize secure chat with additional questions, if there is no response within 30 minutes please call the above phone number  Palliative Medicine Team providers are available by phone from 7am to 7pm daily and can be reached through the team cell phone.  Should this patient require assistance outside of these hours, please call the patient's attending physician.

## 2024-05-04 NOTE — Progress Notes (Signed)
 RT note. SBT attempted this morning, patients HR dropped to 50, flipped back to [facility].    05/04/24 0729  Vent Select  Invasive or Noninvasive Invasive  Adult Vent Y  Airway 7.5 mm  Placement Date/Time: 04/28/24 0945   Placed By: ICU physician  Airway Device: Endotracheal Tube  Laryngoscope Blade: 4  ETT Types: Oral  Size (mm): 7.5 mm  Cuffed: Cuffed  Insertion attempts: 1  Airway Equipment: Stylet;Video Laryngoscope  Placement C...  Secured at (cm) 25 cm  Measured From Lips  Secured Location Right  Secured By English As A Second Language Teacher No  Tube Holder Repositioned Yes  Prone position No  Cuff Pressure (cm H2O) Clear OR 27-39 CmH2O  Site Condition Dry  Adult Ventilator Settings  Vent Type Servo i  Humidity HME  Vent Mode PRVC  Vt Set 470 mL  Set Rate 24 bmp  FiO2 (%) 30 %  I Time 0.9 Sec(s)  PEEP 5 cmH20  Adult Ventilator Measurements  Peak Airway Pressure 20 L/min  Mean Airway Pressure 10 cmH20  Plateau Pressure 15 cmH20  Resp Rate Spontaneous 1 br/min  Resp Rate Total 25 br/min  Exhaled Vt 561 mL  Measured Ve 11.3 L  I:E Ratio Measured 1:1.8  Auto PEEP 0 cmH20  Total PEEP 5 cmH20  SpO2 100 %  Adult Ventilator Alarms  Alarms On Y  Ve High Alarm 18 L/min  Ve Low Alarm 5 L/min  Resp Rate High Alarm 36 br/min  Resp Rate Low Alarm 8  PEEP Low Alarm 3 cmH2O  Press High Alarm 45 cmH2O  T Apnea 20 sec(s)  VAP Prevention  HOB> 30 Degrees Y  Equipment wiped down Yes  Daily Weaning Assessment  Daily Assessment of Readiness to Wean Wean protocol criteria met (SBT performed)  SBT Method CPAP 5 cm H20 and PS 5 cm H20  Weaning Start Time 0730  Patient response Failed SBT terminated  Reason SBT Terminated  (HR dropped to 50)  Breath Sounds  Bilateral Breath Sounds Clear;Diminished

## 2024-05-05 LAB — CULTURE, BAL-QUANTITATIVE W GRAM STAIN
Culture: 10000 — AB
Special Requests: NORMAL

## 2024-05-05 LAB — TRIGLYCERIDES: Triglycerides: 101 mg/dL (ref <150)

## 2024-05-05 LAB — GLUCOSE, CAPILLARY
Glucose-Capillary: 109 mg/dL — ABNORMAL HIGH (ref 70–99)
Glucose-Capillary: 127 mg/dL — ABNORMAL HIGH (ref 70–99)
Glucose-Capillary: 130 mg/dL — ABNORMAL HIGH (ref 70–99)
Glucose-Capillary: 102 mg/dL — ABNORMAL HIGH (ref 70–99)
Glucose-Capillary: 131 mg/dL — ABNORMAL HIGH (ref 70–99)
Glucose-Capillary: 123 mg/dL — ABNORMAL HIGH (ref 70–99)

## 2024-05-05 MED ORDER — CLONIDINE HCL 0.1 MG PO TABS
0.3000 mg | ORAL_TABLET | Freq: Four times a day (QID) | ORAL | Status: DC
  Administered 2024-05-05 – 2024-05-06 (×4): 0.3 mg
  Filled 2024-05-05 (×4): qty 3

## 2024-05-05 MED ORDER — CLONAZEPAM 1 MG PO TABS: 1.0000 mg | ORAL_TABLET | Freq: Two times a day (BID) | ORAL | Status: DC

## 2024-05-05 MED ORDER — METHOCARBAMOL 1000 MG/10ML IJ SOLN
500.0000 mg | Freq: Once | INTRAMUSCULAR | Status: AC
  Administered 2024-05-05: 500 mg via INTRAVENOUS
  Filled 2024-05-05: qty 10

## 2024-05-05 MED ORDER — CLONAZEPAM 1 MG PO TABS
2.0000 mg | ORAL_TABLET | Freq: Two times a day (BID) | ORAL | Status: DC
  Administered 2024-05-05 – 2024-05-09 (×8): 2 mg
  Filled 2024-05-05 (×8): qty 2

## 2024-05-05 MED ORDER — METHOCARBAMOL 500 MG PO TABS
500.0000 mg | ORAL_TABLET | Freq: Three times a day (TID) | ORAL | Status: DC
  Administered 2024-05-05 – 2024-05-07 (×6): 500 mg
  Filled 2024-05-05 (×6): qty 1

## 2024-05-05 NOTE — Progress Notes (Signed)
 Attempted to reach spouse to obtain consent for surgery. No answer, left VM.   Dreama GEANNIE Hanger, MD General and Trauma Surgery [facility]

## 2024-05-05 NOTE — Plan of Care (Signed)
  Problem: Skin Integrity: Goal: Risk for impaired skin integrity will decrease Outcome: Progressing   Problem: Pain Managment: Goal: General experience of comfort will improve and/or be controlled Outcome: Progressing   Problem: Elimination: Goal: Will not experience complications related to urinary retention Outcome: Progressing   Problem: Elimination: Goal: Will not experience complications related to bowel motility Outcome: Progressing   Problem: Clinical Measurements: Goal: Will remain free from infection Outcome: Progressing   Problem: Clinical Measurements: Goal: Ability to maintain clinical measurements within normal limits will improve Outcome: Progressing    Plan of care, assessment, monitoring, treatment, and intervention (s) ongoing, see MAR see flowsheet

## 2024-05-05 NOTE — Progress Notes (Addendum)
 NAME:  [name] [name], MRN:  [id number], DOB:  02-11-1976, LOS: 21 ADMISSION DATE:  04/14/2024, CONSULTATION DATE:  04/14/2024 REFERRING MD:  Suzette - [facility] EDP, CHIEF COMPLAINT:  AMS  History of Present Illness:  48 year old man who presented to [facility] ED 11/2 with AMS. PMHx significant for MSSA bacteremia, MSSA PNA, infective endocarditis, cervical through lumbar osteomyelitis, psoas abscess, severe protein calorie malnutrition/FTT, substance abuse. Recent admission 10/2 - 10/13 for sepsis, widespread MSSA.  Arrived to [facility] ED covered in feces, altered and with poor airway protection; he was subsequently intubated. CXR demonstrated RLL opacities. Confirmed bilateral septic pulmonary emboli, tricuspid valve endocarditis.  Required intubation and mechanical ventilation. Clinically progressed and was transferred out of ICU 11/10.  Reconsulted 11/13 for AMS, tachypnea and tachycardia.   Pertinent Medical History:  MSSA bacteremia MSSA PNA Lumbar osteomyelitis  Psoas abscess  Severe protein calorie malnutrition FTT in adult  Significant Hospital Events: Including procedures, antibiotic start and stop dates in addition to other pertinent events   11/2 [facility] ED w AMS. Intubated. Started on vanc mero. Txf request to [place] to [facility]  11/3 Extubated, developed respiratory distress requiring reintubation. Brief cardiac arrest during intubation.  11/3 Transthoracic echocardiogram > LVEF 40-45%, global hypokinesis, grade 1 diastolic dysfunction, normal RV function.  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  No mitral or aortic valve involvement 11/3 CT Chest, abdomen, pelvis >> numerous bilateral cavitary and solid nodules consistent with septic emboli, left lower lobe consolidation with a small pleural effusion, right basilar atelectasis.  Moderate free fluid in the pelvis.  Erosive changes at T5-T6, L2-L3, L4-L5 suggestive of discitis 11/3 CT Head >> no acute abnormality.   Opacified left maxillary sinus 11/10 Transferred out of ICU. 11/13 PCCM reconsulted for decreased mental status, tachycardia, tachypnea. Started diuretics 11/16 Poorly responsive, transferred back to ICU and intubated for the third time. Pressors started post intubation. Renal function worse. UOP poor.  11/20 NO acute events overnight, awake and following commands.  Patient and spouse met with palliative care and collectively decision was made to proceed with trach and PEG if needed 11/21 no acute events overnight, hemoglobin remained stable 11/23 pain, discomfort, muscle spasms  Interim History/Subjective:  Patient is awake alert interactive Able to communicate writing on the board Still having pain and discomfort Complaining of some muscle spasms today  Objective:   Blood pressure 122/88, pulse 82, temperature 98.3 F (36.8 C), temperature source Axillary, resp. rate (!) 29, height 6' (1.829 m), weight 70.5 kg, SpO2 97%.    Vent Mode: PRVC FiO2 (%):  [30 %] 30 % Set Rate:  [24 bmp] 24 bmp Vt Set:  [470 mL] 470 mL PEEP:  [5 cmH20] 5 cmH20 Pressure Support:  [5 cmH20] 5 cmH20 Plateau Pressure:  [16 cmH20-22 cmH20] 22 cmH20   Intake/Output Summary (Last 24 hours) at 05/05/2024 1046 Last data filed at 05/05/2024 0900 Gross per 24 hour  Intake 2640.41 ml  Output 2550 ml  Net 90.41 ml   Filed Weights   05/03/24 0231 05/04/24 0500 05/05/24 0412  Weight: 72 kg 67.5 kg 70.5 kg   Physical Examination: General: Acute on chronically ill-appearing HEENT: Moist oral mucosa, endotracheal tube in place Neuro: Awake alert interactive, good strength in bilateral upper extremities CV: S1-S2 appreciated, PULM: Rhonchi bilaterally GI: Soft, bowel sounds appreciated Extremities: Skin is warm and dry Skin: no rashes or lesions  I reviewed last 24 h vitals and pain scores, last 48 h intake  and output, last 24 h labs and trends, and last 24 h imaging results.  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 MSSA septic pulmonary emboli C-spine septic arthritis -Appreciate ID follow-up -Completed Zyvox  11/16 -On cefazolin  with treatment plan for 6 to 8 weeks -Cultures on 05/02/2024-negative to date  Acute hypoxic and hypercapnic respiratory failure secondary to MSSA pneumonia, ARDS, mucous plugging Septic pulmonary emboli Generalized deconditioning - Failed SBT today with tachypnea -Head of bed elevated 30 degrees - Plateau pressures less than 30 - Intermittent chest x-rays  Airway management - Tentative scheduled for tracheostomy 05/06/2024  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% with global hypokinesis of the left ventricle and grade 1 diastolic dysfunction 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 - Continue hemodynamic support  Metabolic encephalopathy secondary to hypercarbia Anxiety On opiates for pain management - History of substance abuse and chronic anxiety - Neuroprotective measures- normothermia, euglycemia, HOB greater than 30, head in neutral alignment, normocapnia, normoxia - Pain management -Increase Klonopin  to 2 mg twice daily - On oxycodone  10 every 6 - As needed fentanyl  - On Precedex  - Will start on clonidine  -Hopefully can get off Precedex  in 24 hours  Acute renal failure Urinary retention - Avoid nephrotoxic medications Keep Foley in place till 11/26 as per urology  Chronic anemia - Transfuse per protocol  Protein calorie malnutrition - Continue tube feeds  Stage II pressure ulcer sacrum, present on admission - Relieving devices - Optimize nutrition - Wound care  The patient is critically ill with multiple organ systems failure and requires high complexity decision making for assessment and support, frequent evaluation and titration of therapies, application of advanced monitoring technologies and extensive  interpretation of multiple databases. Critical Care Time devoted to patient care services described in this note independent of APP/resident time (if applicable)  is 32 minutes.   Jennet Epley MD [facility] Personal pager: See Amion If unanswered, please page CCM On-call: #[phone]

## 2024-05-06 ENCOUNTER — Inpatient Hospital Stay (HOSPITAL_COMMUNITY): Payer: MEDICAID | Admitting: Anesthesiology

## 2024-05-06 ENCOUNTER — Encounter (HOSPITAL_COMMUNITY): Payer: Self-pay | Admitting: Critical Care Medicine

## 2024-05-06 ENCOUNTER — Encounter (HOSPITAL_COMMUNITY): Admission: EM | Disposition: E | Payer: Self-pay | Source: Home / Self Care | Attending: Internal Medicine

## 2024-05-06 ENCOUNTER — Inpatient Hospital Stay (HOSPITAL_COMMUNITY): Payer: MEDICAID

## 2024-05-06 DIAGNOSIS — I10 Essential (primary) hypertension: Secondary | ICD-10-CM

## 2024-05-06 DIAGNOSIS — J9690 Respiratory failure, unspecified, unspecified whether with hypoxia or hypercapnia: Secondary | ICD-10-CM

## 2024-05-06 DIAGNOSIS — F419 Anxiety disorder, unspecified: Secondary | ICD-10-CM

## 2024-05-06 DIAGNOSIS — F17210 Nicotine dependence, cigarettes, uncomplicated: Secondary | ICD-10-CM

## 2024-05-06 HISTORY — PX: TRACHEOSTOMY TUBE PLACEMENT: SHX814

## 2024-05-06 LAB — BASIC METABOLIC PANEL WITH GFR
Anion gap: 14 (ref 5–15)
BUN: 110 mg/dL — ABNORMAL HIGH (ref 6–20)
CO2: 21 mmol/L — ABNORMAL LOW (ref 22–32)
Calcium: 8.3 mg/dL — ABNORMAL LOW (ref 8.9–10.3)
Chloride: 100 mmol/L (ref 98–111)
Creatinine, Ser: 2.87 mg/dL — ABNORMAL HIGH (ref 0.61–1.24)
GFR, Estimated: 27 mL/min — ABNORMAL LOW (ref >60)
Glucose, Bld: 106 mg/dL — ABNORMAL HIGH (ref 70–99)
Potassium: 5.7 mmol/L — ABNORMAL HIGH (ref 3.5–5.1)
Sodium: 135 mmol/L (ref 135–145)

## 2024-05-06 LAB — CBC
HCT: 29.1 % — ABNORMAL LOW (ref 39.0–52.0)
Hemoglobin: 9.3 g/dL — ABNORMAL LOW (ref 13.0–17.0)
MCH: 29.6 pg (ref 26.0–34.0)
MCHC: 32 g/dL (ref 30.0–36.0)
MCV: 92.7 fL (ref 80.0–100.0)
Platelets: 172 K/uL (ref 150–400)
RBC: 3.14 MIL/uL — ABNORMAL LOW (ref 4.22–5.81)
RDW: 18.6 % — ABNORMAL HIGH (ref 11.5–15.5)
WBC: 9.5 K/uL (ref 4.0–10.5)
nRBC: 0 % (ref 0.0–0.2)

## 2024-05-06 LAB — GLUCOSE, CAPILLARY
Glucose-Capillary: 105 mg/dL — ABNORMAL HIGH (ref 70–99)
Glucose-Capillary: 111 mg/dL — ABNORMAL HIGH (ref 70–99)
Glucose-Capillary: 111 mg/dL — ABNORMAL HIGH (ref 70–99)
Glucose-Capillary: 88 mg/dL (ref 70–99)
Glucose-Capillary: 89 mg/dL (ref 70–99)
Glucose-Capillary: 90 mg/dL (ref 70–99)

## 2024-05-06 SURGERY — CREATION, TRACHEOSTOMY
Anesthesia: General

## 2024-05-06 MED ORDER — PROPOFOL 500 MG/50ML IV EMUL
INTRAVENOUS | Status: DC | PRN
  Administered 2024-05-06: 100 ug/kg/min via INTRAVENOUS

## 2024-05-06 MED ORDER — POLYETHYLENE GLYCOL 3350 17 G PO PACK: 17.0000 g | PACK | Freq: Every day | ORAL | Status: DC

## 2024-05-06 MED ORDER — 0.9 % SODIUM CHLORIDE (POUR BTL) OPTIME
TOPICAL | Status: DC | PRN
  Administered 2024-05-06: 1000 mL

## 2024-05-06 MED ORDER — LIDOCAINE-EPINEPHRINE 1.5 %-1:200,000 OPTIME - NO CHARGE
INTRAMUSCULAR | Status: DC | PRN
  Administered 2024-05-06: 5 mL via SUBCUTANEOUS

## 2024-05-06 MED ORDER — FENTANYL CITRATE (PF) 250 MCG/5ML IJ SOLN
INTRAMUSCULAR | Status: DC | PRN
  Administered 2024-05-06: 50 ug via INTRAVENOUS

## 2024-05-06 MED ORDER — LACTATED RINGERS IV SOLN: INTRAVENOUS | Status: DC | PRN

## 2024-05-06 MED ORDER — ONDANSETRON HCL 4 MG/2ML IJ SOLN
INTRAMUSCULAR | Status: DC | PRN
  Administered 2024-05-06: 4 mg via INTRAVENOUS

## 2024-05-06 MED ORDER — SUGAMMADEX SODIUM 200 MG/2ML IV SOLN
INTRAVENOUS | Status: DC | PRN
  Administered 2024-05-06: 200 mg via INTRAVENOUS

## 2024-05-06 MED ORDER — FENTANYL CITRATE (PF) 100 MCG/2ML IJ SOLN
INTRAMUSCULAR | Status: AC
  Filled 2024-05-06: qty 2

## 2024-05-06 MED ORDER — PROPOFOL 10 MG/ML IV BOLUS
INTRAVENOUS | Status: DC | PRN
  Administered 2024-05-06: 40 mg via INTRAVENOUS

## 2024-05-06 MED ORDER — ROCURONIUM BROMIDE 10 MG/ML (PF) SYRINGE
PREFILLED_SYRINGE | INTRAVENOUS | Status: DC | PRN
  Administered 2024-05-06: 40 mg via INTRAVENOUS
  Administered 2024-05-06: 20 mg via INTRAVENOUS

## 2024-05-06 MED ORDER — PROPOFOL 10 MG/ML IV BOLUS
INTRAVENOUS | Status: AC
  Filled 2024-05-06: qty 20

## 2024-05-06 MED ORDER — MIDAZOLAM HCL (PF) 2 MG/2ML IJ SOLN
INTRAMUSCULAR | Status: DC | PRN
  Administered 2024-05-06: 2 mg via INTRAVENOUS

## 2024-05-06 MED ORDER — ORAL CARE MOUTH RINSE: 15.0000 mL | OROMUCOSAL | Status: DC | PRN

## 2024-05-06 MED ORDER — ALBUMIN HUMAN 5 % IV SOLN
25.0000 g | Freq: Once | INTRAVENOUS | Status: AC
  Administered 2024-05-06: 25 g via INTRAVENOUS
  Filled 2024-05-06: qty 500

## 2024-05-06 MED ORDER — MIDAZOLAM HCL 2 MG/2ML IJ SOLN
INTRAMUSCULAR | Status: AC
Start: 2024-05-06 — End: 2024-05-06
  Filled 2024-05-06: qty 2

## 2024-05-06 MED ORDER — ORAL CARE MOUTH RINSE
15.0000 mL | OROMUCOSAL | Status: DC
  Administered 2024-05-06 – 2024-05-13 (×83): 15 mL via OROMUCOSAL

## 2024-05-06 MED ORDER — ROCURONIUM BROMIDE 10 MG/ML (PF) SYRINGE
PREFILLED_SYRINGE | INTRAVENOUS | Status: AC
  Filled 2024-05-06: qty 10

## 2024-05-06 MED ORDER — EPHEDRINE SULFATE-NACL 50-0.9 MG/10ML-% IV SOSY
PREFILLED_SYRINGE | INTRAVENOUS | Status: DC | PRN
  Administered 2024-05-06: 10 mg via INTRAVENOUS

## 2024-05-06 MED ORDER — LIDOCAINE 2% (20 MG/ML) 5 ML SYRINGE
INTRAMUSCULAR | Status: AC
  Filled 2024-05-06: qty 5

## 2024-05-06 MED ORDER — SODIUM ZIRCONIUM CYCLOSILICATE 10 G PO PACK
10.0000 g | PACK | Freq: Once | ORAL | Status: AC
Start: 2024-05-06 — End: 2024-05-06
  Administered 2024-05-06: 10 g
  Filled 2024-05-06: qty 1

## 2024-05-06 MED ORDER — CLONIDINE HCL 0.1 MG PO TABS
0.2000 mg | ORAL_TABLET | Freq: Four times a day (QID) | ORAL | Status: DC
  Filled 2024-05-06 (×2): qty 2

## 2024-05-06 MED ORDER — PHENYLEPHRINE HCL-NACL 20-0.9 MG/250ML-% IV SOLN
INTRAVENOUS | Status: DC | PRN
  Administered 2024-05-06 (×4): 160 ug via INTRAVENOUS

## 2024-05-06 SURGICAL SUPPLY — 28 items
BAG COUNTER SPONGE SURGICOUNT (BAG) ×1 IMPLANT
BLADE CLIPPER SURG (BLADE) IMPLANT
CANISTER SUCTION 3000ML PPV (SUCTIONS) ×1 IMPLANT
COVER SURGICAL LIGHT HANDLE (MISCELLANEOUS) ×1 IMPLANT
DRAPE UTILITY XL STRL (DRAPES) ×1 IMPLANT
ELECT CAUTERY BLADE 6.4 (BLADE) ×1 IMPLANT
ELECTRODE REM PT RTRN 9FT ADLT (ELECTROSURGICAL) ×1 IMPLANT
GAUZE 4X4 16PLY ~~LOC~~+RFID DBL (SPONGE) ×1 IMPLANT
GAUZE SPONGE 4X4 12PLY STRL (GAUZE/BANDAGES/DRESSINGS) IMPLANT
GLOVE BIO SURGEON STRL SZ8 (GLOVE) ×1 IMPLANT
GLOVE BIOGEL PI IND STRL 8 (GLOVE) ×1 IMPLANT
GOWN STRL REUS W/ TWL LRG LVL3 (GOWN DISPOSABLE) ×1 IMPLANT
GOWN STRL REUS W/ TWL XL LVL3 (GOWN DISPOSABLE) ×1 IMPLANT
KIT BASIN OR (CUSTOM PROCEDURE TRAY) ×1 IMPLANT
KIT TURNOVER KIT B (KITS) ×1 IMPLANT
PACK EENT II TURBAN DRAPE (CUSTOM PROCEDURE TRAY) ×1 IMPLANT
PAD ARMBOARD POSITIONER FOAM (MISCELLANEOUS) ×2 IMPLANT
PENCIL SMOKE EVACUATOR (MISCELLANEOUS) ×1 IMPLANT
SOLN 0.9% NACL POUR BTL 1000ML (IV SOLUTION) ×1 IMPLANT
SPONGE INTESTINAL PEANUT (DISPOSABLE) ×1 IMPLANT
SUT PROLENE 2 0 SH 30 (SUTURE) IMPLANT
SUT VICRYL AB 3 0 TIES (SUTURE) ×1 IMPLANT
SYR 20ML LL LF (SYRINGE) ×1 IMPLANT
TOWEL GREEN STERILE (TOWEL DISPOSABLE) ×1 IMPLANT
TOWEL GREEN STERILE FF (TOWEL DISPOSABLE) ×1 IMPLANT
TRAY W/TRACH TUBE 7.5 (MISCELLANEOUS) IMPLANT
TRAY W/TRACH TUBE 8.5 (MISCELLANEOUS) IMPLANT
TUBE CONNECTING 12X1/4 (SUCTIONS) ×1 IMPLANT

## 2024-05-06 NOTE — Progress Notes (Signed)
 SLP Cancellation Note  Patient Details Name: [name] [name] MRN: [id number] DOB: 1975-10-14   Cancelled treatment:  Pt off unit for tracheotomy.  SLP will follow - he may benefit from inline PMV in the near future.   Samiha Denapoli L. Vona, MA CCC/SLP Clinical Specialist - Acute Care SLP Acute Rehabilitation Services Office number [phone]          Vona Palma Laurice 05/06/2024, 11:52 AM

## 2024-05-06 NOTE — Plan of Care (Signed)
  Problem: Clinical Measurements: Goal: Ability to maintain clinical measurements within normal limits will improve Outcome: Progressing   Problem: Clinical Measurements: Goal: Will remain free from infection Outcome: Progressing   Problem: Clinical Measurements: Goal: Diagnostic test results will improve Outcome: Progressing   Problem: Clinical Measurements: Goal: Respiratory complications will improve Outcome: Progressing   Problem: Skin Integrity: Goal: Risk for impaired skin integrity will decrease Outcome: Progressing   Problem: Pain Managment: Goal: General experience of comfort will improve and/or be controlled Outcome: Progressing    Plan of care, monitoring, assessment, treatment, and intervention (s)  ongoing, see MAR see flowsheet

## 2024-05-06 NOTE — TOC Progression Note (Addendum)
 Transition of Care [facility]) - Progression Note    Patient Details  Name: [name] [name] MRN: [id number] Date of Birth: 02-May-1976  Transition of Care [facility]) CM/SW Contact  Tom-Johnson, Viann Nielson Daphne, RN Phone Number: 05/06/2024, 1:51 PM  Clinical Narrative:     Patient is scheduled for Tracheostomy Creation by Gen Sx today.  CM f/u with [facility] Liaison, DJ about disposition. DJ awaiting response from [facility] leadership about LOG.  CM will continue to follow as patient progresses with care towards discharge.                    Expected Discharge Plan and Services                                               Social Drivers of Health (SDOH) Interventions SDOH Screenings   Food Insecurity: No Food Insecurity (03/15/2024)  Housing: Low Risk  (03/15/2024)  Transportation Needs: No Transportation Needs (03/15/2024)  Utilities: Not At Risk (03/15/2024)  Tobacco Use: High Risk (05/06/2024)    Readmission Risk Interventions    04/15/2024    4:16 PM 03/25/2024    9:50 AM 03/22/2024   10:01 AM  Readmission Risk Prevention Plan  Transportation Screening Complete Complete Complete  PCP or Specialist Appt within 5-7 Days Complete    Home Care Screening Complete Complete Complete  Medication Review (RN CM) Referral to Pharmacy Complete Complete

## 2024-05-06 NOTE — Procedures (Addendum)
 Cortrak  Person Inserting Tube:  [name] [name], RD Tube Type:  Cortrak - 43 inches Tube Size:  10 Tube Location:  Right nare Secured by: Bridle Technique Used to Measure Tube Placement:  Marking at nare/corner of mouth Cortrak Secured At:  62 cm Initial Placement Verification:  Xray  Cortrak Tube Team Note:  Consult received to place a Cortrak feeding tube.   Xray ordered to confirm placement before use.   If the tube becomes dislodged please keep the tube and contact the Cortrak team at [email or web address] for replacement.  If after hours and replacement cannot be delayed, place a NG tube and confirm placement with an abdominal x-ray.     Addendum: Rn reported Cortrak would not flush, RD returned and pulled Cortrak back a few cm and it appears to be in similar position as previous xray per Cortrak machine. No repeat xray required. Rn able to flush tube.   [name] Elihue, MS, RDN, LDN Clinical Dietitian I Please reach out via secure chat

## 2024-05-06 NOTE — Progress Notes (Signed)
 NAME:  [name] [name], MRN:  [id number], DOB:  Apr 17, 1976, LOS: 22 ADMISSION DATE:  04/14/2024, CONSULTATION DATE:  04/14/2024 REFERRING MD:  Suzette - [facility] EDP, CHIEF COMPLAINT:  AMS  History of Present Illness:  48 year old man who presented to [facility] ED 11/2 with AMS. PMHx significant for MSSA bacteremia, MSSA PNA, infective endocarditis, cervical through lumbar osteomyelitis, psoas abscess, severe protein calorie malnutrition/FTT, substance abuse. Recent admission 10/2 - 10/13 for sepsis, widespread MSSA.  Arrived to [facility] ED covered in feces, altered and with poor airway protection; he was subsequently intubated. CXR demonstrated RLL opacities. Confirmed bilateral septic pulmonary emboli, tricuspid valve endocarditis.  Required intubation and mechanical ventilation. Clinically progressed and was transferred out of ICU 11/10.  Reconsulted 11/13 for AMS, tachypnea and tachycardia.   Pertinent Medical History:  MSSA bacteremia MSSA PNA Lumbar osteomyelitis  Psoas abscess  Severe protein calorie malnutrition FTT in adult  Significant Hospital Events: Including procedures, antibiotic start and stop dates in addition to other pertinent events   11/2 [facility] ED w AMS. Intubated. Started on vanc mero. Txf request to [place] to [facility]  11/3 Extubated, developed respiratory distress requiring reintubation. Brief cardiac arrest during intubation.  11/3 Transthoracic echocardiogram > LVEF 40-45%, global hypokinesis, grade 1 diastolic dysfunction, normal RV function.  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  No mitral or aortic valve involvement 11/3 CT Chest, abdomen, pelvis >> numerous bilateral cavitary and solid nodules consistent with septic emboli, left lower lobe consolidation with a small pleural effusion, right basilar atelectasis.  Moderate free fluid in the pelvis.  Erosive changes at T5-T6, L2-L3, L4-L5 suggestive of discitis 11/3 CT Head >> no acute abnormality.   Opacified left maxillary sinus 11/10 Transferred out of ICU. 11/13 PCCM reconsulted for decreased mental status, tachycardia, tachypnea. Started diuretics 11/16 Poorly responsive, transferred back to ICU and intubated for the third time. Pressors started post intubation. Renal function worse. UOP poor.  11/20 NO acute events overnight, awake and following commands.  Patient and spouse met with palliative care and collectively decision was made to proceed with trach and PEG if needed 11/21 no acute events overnight, hemoglobin remained stable 11/23 pain, discomfort, muscle spasms  Interim History/Subjective:  Afebrile Plans for trach today with CCS   Objective:   Blood pressure 107/80, pulse (!) 51, temperature (!) 97.2 F (36.2 C), temperature source Axillary, resp. rate (!) 24, height 6' (1.829 m), weight 75.7 kg, SpO2 97%.    Vent Mode: PRVC FiO2 (%):  [30 %] 30 % Set Rate:  [24 bmp] 24 bmp Vt Set:  [470 mL] 470 mL PEEP:  [5 cmH20] 5 cmH20 Pressure Support:  [5 cmH20] 5 cmH20 Plateau Pressure:  [13 cmH20-22 cmH20] 13 cmH20   Intake/Output Summary (Last 24 hours) at 05/06/2024 0713 Last data filed at 05/06/2024 0400 Gross per 24 hour  Intake 1941.25 ml  Output 1475 ml  Net 466.25 ml   Filed Weights   05/04/24 0500 05/05/24 0412 05/06/24 0433  Weight: 67.5 kg 70.5 kg 75.7 kg   Physical Examination: Dex 0.3 General:  AoC ill appearing adult male in bed in NAD HEENT: MM pink/moist, ETT/ OGT, pupils 3/r, c-collar Neuro: Awake, writes on board, follows commands, MAE, 3-4 BUE, 2/5 LE CV: rr, SB 50s PULM:  clear, diminished, vent supported, not breathing over set rate, scant secretions, very minimal cough GI: soft, bs+, NT, foley  Extremities: warm, feet cool/dry, +2 pedal edema  Skin: no rashes PIVs  UOP 1.47L/ /24hrs Net -1.3 Last stool 11/21  Labs pending  Resolved Problem List:  Postintubation hypotension  Assessment and Plan:   MSSA infective endocarditis of  the tricuspid valve MSSA lumbar osteomyelitis Psoas abscess MSSA septic pulmonary emboli C-spine septic arthritis - completed Zyvox  11/16 -  P:   - Appreciate ID - 6-8 weeks of cefazolin  (end date 12/16) - 11/20 Bcxs ngtd x 4 days - trend fever/ WBC curve - c-collar   Acute hypoxic and hypercapnic respiratory failure secondary to MSSA pneumonia, ARDS, mucous plugging Septic pulmonary emboli Generalized deconditioning - cont to fail SBT, < 2hrs 11/23 P:  - tentative trach today with CCS, will defer peg for now, recs for SLP  - cont full LTVV support, 4-8cc/kg IBW with goal Pplat <30 and DP<15  - VAP prevention protocol/ PPI - PAD protocol for sedation> as below, RASS goal 0 - intermittent CXR/ ABG - wean FiO2 as able for SpO2 >92% - daily SAT & SBT  - aggressive pulmonary hygiene, guaifenesin  - prn duoneb    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% with global hypokinesis of the left ventricle and grade 1 diastolic dysfunction 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  Hopefully wean off precedex  after trach today  - cont klonopin  2mg  BID  - cont robaxin , gabapentin , lidoderm  patch prn, oxy 10mg  q6hrs, prn fentanyl  with bowel regimen - Neuroprotective measures- normothermia, euglycemia, HOB greater than 30, head in neutral alignment, normocapnia, normoxia   Nonoliguric AKI Urinary retention s/p foley placement by urology 11/16 - Nephrology s/o 11/21 P: - pending BMET, trend - stable UOP - to keep foley in till 11/26 per urology - trend renal indices  - strict I/Os, daily wts - avoid nephrotoxins, renal dose meds, hemodynamic support as above   Chronic  anemia - trend CBC, transfuse per protocol    Protein calorie malnutrition -  cont EN per RD, MVI and FWF   Stage II pressure ulcer sacrum, present on admission - cont wound care - maximize nutrition, frequent turns   CRITICAL CARE Performed by: Lyle Pesa   Total critical care time: 35 minutes  Critical care time was exclusive of separately billable procedures and treating other patients.  Critical care was necessary to treat or prevent imminent or life-threatening deterioration.  Critical care was time spent personally by me on the following activities: development of treatment plan with patient and/or surrogate as well as nursing, discussions with consultants, evaluation of patient's response to treatment, examination of patient, obtaining history from patient or surrogate, ordering and performing treatments and interventions, ordering and review of laboratory studies, ordering and review of radiographic studies, pulse oximetry and re-evaluation of patient's condition.    Lyle Pesa, NP [facility] 05/06/2024, 7:13 AM  See Amion for pager If no response to pager , please call [phone] until 7pm After 7:00 pm call Elink  [phone]

## 2024-05-06 NOTE — Transfer of Care (Signed)
 Immediate Anesthesia Transfer of Care Note  Patient: [name] [name]  Procedure(s) Performed: CREATION, TRACHEOSTOMY  Patient Location: ICU  Anesthesia Type:General  Level of Consciousness: awake and alert   Airway & Oxygen Therapy: Patient placed on Ventilator (see vital sign flow sheet for setting)  Post-op Assessment: Report given to RN and Post -op Vital signs reviewed and stable  Post vital signs: Reviewed and stable  Last Vitals:  Vitals Value Taken Time  BP    Temp    Pulse    Resp    SpO2      Last Pain:  Vitals:   05/06/24 1017  TempSrc:   PainSc: Asleep      Patients Stated Pain Goal: 0 (05/06/24 0400)  Complications: No notable events documented.

## 2024-05-06 NOTE — Anesthesia Preprocedure Evaluation (Addendum)
 Anesthesia Evaluation  Patient identified by MRN, date of birth, ID band Patient unresponsive    Reviewed: Allergy & Precautions, Patient's Chart, lab work & pertinent test results, Unable to perform ROS - Chart review only  Airway Mallampati: Intubated       Dental  (+) Teeth Intact   Pulmonary Current Smoker    + decreased breath sounds      Cardiovascular hypertension,  Rhythm:Regular Rate:Normal  Echo:  1. Left ventricular ejection fraction, by estimation, is 40 to 45%. The  left ventricle has mildly decreased function. The left ventricle  demonstrates global hypokinesis. Left ventricular diastolic parameters are  consistent with Grade I diastolic  dysfunction (impaired relaxation). There is abnormal (paradoxical) septal  motion, consistent with right ventricular volume overload.   2. RVSP estimated at 76 mmHg. Right ventricular systolic function is  normal. The right ventricular size is severely enlarged. There is severely  elevated pulmonary artery systolic pressure.   3. Right atrial size was severely dilated.   4. The mitral valve is normal in structure. No evidence of mitral valve  regurgitation. No evidence of mitral stenosis.   5. There are two mobile masses seen on the tricuspid valve with one  measuring 2.1 cm by 1.1 cm and another (1.8 by 0.8 cm)(#85, #34, #41, #66)  near the posterior leaflet with associate TR, though hard to quantify  degree of regurgitation.   6. The aortic valve is normal in structure. Aortic valve regurgitation is  not visualized. No aortic stenosis is present.   7. The inferior vena cava is dilated in size with <50% respiratory  variability, suggesting right atrial pressure of 15 mmHg.     Neuro/Psych    GI/Hepatic negative GI ROS, Neg liver ROS,,,  Endo/Other  negative endocrine ROS    Renal/GU negative Renal ROS     Musculoskeletal negative musculoskeletal ROS (+)     Abdominal   Peds  Hematology negative hematology ROS (+)   Anesthesia Other Findings   Reproductive/Obstetrics                              Anesthesia Physical Anesthesia Plan  ASA: 3  Anesthesia Plan: General   Post-op Pain Management:    Induction: Inhalational  PONV Risk Score and Plan: 2 and Ondansetron , Propofol  infusion and Midazolam   Airway Management Planned: Tracheostomy  Additional Equipment: None  Intra-op Plan:   Post-operative Plan: Post-operative intubation/ventilation  Informed Consent: I have reviewed the patients History and Physical, chart, labs and discussed the procedure including the risks, benefits and alternatives for the proposed anesthesia with the patient or authorized representative who has indicated his/her understanding and acceptance.     History available from chart only and Consent reviewed with POA  Plan Discussed with: CRNA  Anesthesia Plan Comments:          Anesthesia Quick Evaluation

## 2024-05-06 NOTE — Anesthesia Postprocedure Evaluation (Addendum)
 Anesthesia Post Note  Patient: [name] [name]  Procedure(s) Performed: CREATION, TRACHEOSTOMY     Patient location during evaluation: SICU Anesthesia Type: General Level of consciousness: sedated Pain management: pain level controlled Vital Signs Assessment: post-procedure vital signs reviewed and stable Respiratory status: patient remains intubated per anesthesia plan Cardiovascular status: stable Postop Assessment: no apparent nausea or vomiting Anesthetic complications: no   No notable events documented.               [name] [name] [name] [name]

## 2024-05-06 NOTE — Plan of Care (Signed)
 PMT following along. Patient currently off unit for tracheostomy. No family present in room. PMT will continue to follow.

## 2024-05-06 NOTE — Op Note (Signed)
  05/06/2024  11:45 AM  PATIENT:  [name] [name] Minus  48 y.o. male  PRE-OPERATIVE DIAGNOSIS:  respiratory failure  POST-OPERATIVE DIAGNOSIS:  respiratory failure  PROCEDURE:  Procedure(s): CREATION, TRACHEOSTOMY #6 SHILEY  SURGEON:  Surgeon(s): Sebastian Moles, MD  ASSISTANTS: Mochael Maczis, PA-C   ANESTHESIA:   local and general  EBL:  Total I/O In: 134.2 [I.V.:34.2; IV Piggyback:100] Out: 200 [Urine:200]  BLOOD ADMINISTERED:none  DRAINS: none   SPECIMEN:  No Specimen  DISPOSITION OF SPECIMEN:  N/A  COUNTS:  YES  DICTATION: .Dragon Dictation Procedure detail: Informed consent was obtained.  He is receiving intravenous antibiotics scheduled.  He was brought directly from the intensive care unit to the operating room on the ventilator.  General anesthesia was administered by the anesthesia staff.  His anterior collar was removed and rolls were placed along with tape to secure his head in a neutral position.  His anterior neck was prepped and draped in a sterile fashion.  We did a timeout procedure.  Local was injected 2 cm cephalad to the sternal notch.  A vertical incision was made.  Subcutaneous tissues were dissected down through the platysma revealing the strap muscles.  These were split along the midline.  Had a very small thyroid isthmus which was divided with cautery and good hemostasis was obtained.  The anterior surface of the trachea was identified.  The endotracheal tube was then withdrawn by anesthesia until it was above our area of work.  Angiocath was inserted between the 2nd and 3rd tracheal ring.  Followed by a guidewire.  Next a small blue dilator was placed.  Next, the large Blue Rhino dilator was placed and then a #6 Shiley tracheostomy was placed over a smaller blue dilator.  It passed easily into the airway.  We initially had difficulties with end-tidal return but this resolved when the endotracheal tube was removed from his mouth.  The trachea was suctioned.   There was some secretions but no bleeding.  The small incision below the trach was closed with 2 interrupted 2-0 Prolene's.  We then secured the trach to the skin with 2-0 Prolene's.  A Velcro trach tie was applied.  All counts were correct.  There was excellent hemostasis.  There were not any apparent complications.  He was taken directly back to the ICU and critical but stable condition on the ventilator. PATIENT DISPOSITION:  ICU - intubated and critically ill.   Delay start of Pharmacological VTE agent (>24hrs) due to surgical blood loss or risk of bleeding:  no  Moles Sebastian, MD, MPH, FACS Pager: [phone]  11/24/202511:45 AM

## 2024-05-06 NOTE — Progress Notes (Signed)
 PT Cancellation Note  Patient Details Name: [name] [name] MRN: [id number] DOB: [birth year]   Cancelled Treatment:    Reason Eval/Treat Not Completed: (P) Patient at procedure or test/unavailable Pt is off floor for Trach placement. PT will follow back for treatment another day.  [name] [name] PT, DPT Acute Rehabilitation Services Please use secure chat or  Call Office [phone]    [name] 05/06/2024, 10:57 AM

## 2024-05-06 NOTE — Progress Notes (Signed)
 Patient ID: DEMETRIAS GOODBAR, male   DOB: Mar 17, 1976, 48 y.o.   MRN: [id number]      Subjective: On vent ROS negative except as listed above. Objective: Vital signs in last 24 hours: Temp:  [96.9 F (36.1 C)-97.7 F (36.5 C)] 97.6 F (36.4 C) (11/24 0723) Pulse Rate:  [47-91] 49 (11/24 0809) Resp:  [13-29] 24 (11/24 0809) BP: (93-130)/(62-104) 96/70 (11/24 0800) SpO2:  [89 %-100 %] 96 % (11/24 0809) FiO2 (%):  [30 %] 30 % (11/24 0809) Weight:  [75.7 kg] 75.7 kg (11/24 0433) Last BM Date : 05/04/24  Intake/Output from previous day: 11/23 0701 - 11/24 0700 In: 1941.3 [I.V.:206.4; WH/HU:8464; IV Piggyback:199.9] Out: 1475 [Urine:1475] Intake/Output this shift: Total I/O In: 130.2 [I.V.:30.2; IV Piggyback:100] Out: -   Neck: collar in place, no masses felt  Lab Results: CBC  Recent Labs    05/04/24 0953  WBC 6.5  HGB 7.9*  HCT 25.3*  PLT 125*   BMET Recent Labs    05/04/24 0737 05/04/24 0953  NA 134* 133*  K 5.6* 4.9  CL 99 97*  CO2 19* 22  GLUCOSE 106* 94  BUN 104* 103*  CREATININE 3.04* 3.08*  CALCIUM  8.2* 7.9*   PT/INR No results for input(s): LABPROT, INR in the last 72 hours. ABG No results for input(s): PHART, HCO3 in the last 72 hours.  Invalid input(s): PCO2, PO2  Studies/Results: No results found.  Anti-infectives: Anti-infectives (From admission, onward)    Start     Dose/Rate Route Frequency Ordered Stop   04/30/24 2000  ceFAZolin  (ANCEF ) IVPB 2g/100 mL premix        2 g 200 mL/hr over 30 Minutes Intravenous Every 8 hours 04/30/24 1441 05/28/24 2359   04/28/24 1800  piperacillin -tazobactam (ZOSYN ) IVPB 3.375 g  Status:  Discontinued        3.375 g 12.5 mL/hr over 240 Minutes Intravenous Every 8 hours 04/28/24 1243 04/30/24 1441   04/28/24 1330  piperacillin -tazobactam (ZOSYN ) IVPB 3.375 g        3.375 g 100 mL/hr over 30 Minutes Intravenous  Once 04/28/24 1243 04/28/24 1424   04/26/24 1600  piperacillin -tazobactam (ZOSYN )  IVPB 3.375 g  Status:  Discontinued        3.375 g 12.5 mL/hr over 240 Minutes Intravenous Every 8 hours 04/26/24 1427 04/28/24 1243   04/26/24 1330  linezolid  (ZYVOX ) tablet 600 mg  Status:  Discontinued        600 mg Oral Every 12 hours 04/26/24 1244 04/27/24 1348   04/24/24 1230  Ampicillin -Sulbactam (UNASYN ) 3 g in sodium chloride  0.9 % 100 mL IVPB  Status:  Discontinued        3 g 200 mL/hr over 30 Minutes Intravenous Every 6 hours 04/24/24 1132 04/26/24 1427   04/19/24 1400  ceFAZolin  (ANCEF ) IVPB 2g/100 mL premix  Status:  Discontinued        2 g 200 mL/hr over 30 Minutes Intravenous Every 8 hours 04/19/24 0653 04/24/24 1132   04/15/24 1700  ceFAZolin  (ANCEF ) IVPB 2g/100 mL premix  Status:  Discontinued        2 g 200 mL/hr over 30 Minutes Intravenous Every 12 hours 04/15/24 0900 04/19/24 0653   04/15/24 1300  ceFAZolin  (ANCEF ) IVPB 2g/100 mL premix  Status:  Discontinued        2 g 200 mL/hr over 30 Minutes Intravenous Every 8 hours 04/15/24 0836 04/15/24 0900   04/15/24 0500  meropenem  (MERREM ) 1 g in sodium chloride  0.9 %  100 mL IVPB  Status:  Discontinued        1 g 200 mL/hr over 30 Minutes Intravenous Every 12 hours 04/14/24 2044 04/15/24 0836   04/14/24 2121  vancomycin  variable dose per unstable renal function (pharmacist dosing)  Status:  Discontinued         Does not apply See admin instructions 04/14/24 2122 04/15/24 0836   04/14/24 1500  vancomycin  (VANCOCIN ) IVPB 1000 mg/200 mL premix        1,000 mg 200 mL/hr over 60 Minutes Intravenous  Once 04/14/24 1458 04/15/24 0143   04/14/24 1500  meropenem  (MERREM ) 1 g in sodium chloride  0.9 % 100 mL IVPB  Status:  Discontinued        1 g 200 mL/hr over 30 Minutes Intravenous Every 8 hours 04/14/24 1458 04/14/24 2044       Assessment/Plan: Respiratory failure with prolonged mechanical ventilation - for tracheostomy in the OR today. I called his wife and discussed the procedure, risks, and benefits. She agrees. Phone  consent documented. On Ancef . I D/W CCM team as well.    LOS: 22 days    Dann Hummer, MD, MPH, FACS Trauma & General Surgery Use [email or web address] to contact on call provider  05/06/2024

## 2024-05-07 ENCOUNTER — Encounter (HOSPITAL_COMMUNITY): Payer: Self-pay | Admitting: General Surgery

## 2024-05-07 LAB — GLUCOSE, CAPILLARY
Glucose-Capillary: 107 mg/dL — ABNORMAL HIGH (ref 70–99)
Glucose-Capillary: 108 mg/dL — ABNORMAL HIGH (ref 70–99)
Glucose-Capillary: 121 mg/dL — ABNORMAL HIGH (ref 70–99)
Glucose-Capillary: 132 mg/dL — ABNORMAL HIGH (ref 70–99)
Glucose-Capillary: 83 mg/dL (ref 70–99)
Glucose-Capillary: 97 mg/dL (ref 70–99)

## 2024-05-07 LAB — CULTURE, BLOOD (ROUTINE X 2)
Culture: NO GROWTH
Culture: NO GROWTH

## 2024-05-07 LAB — BASIC METABOLIC PANEL WITH GFR
Anion gap: 12 (ref 5–15)
BUN: 108 mg/dL — ABNORMAL HIGH (ref 6–20)
CO2: 22 mmol/L (ref 22–32)
Calcium: 8.1 mg/dL — ABNORMAL LOW (ref 8.9–10.3)
Chloride: 100 mmol/L (ref 98–111)
Creatinine, Ser: 2.83 mg/dL — ABNORMAL HIGH (ref 0.61–1.24)
GFR, Estimated: 27 mL/min — ABNORMAL LOW (ref >60)
Glucose, Bld: 103 mg/dL — ABNORMAL HIGH (ref 70–99)
Potassium: 5.2 mmol/L — ABNORMAL HIGH (ref 3.5–5.1)
Sodium: 134 mmol/L — ABNORMAL LOW (ref 135–145)

## 2024-05-07 LAB — CBC
HCT: 27 % — ABNORMAL LOW (ref 39.0–52.0)
Hemoglobin: 8.3 g/dL — ABNORMAL LOW (ref 13.0–17.0)
MCH: 28.4 pg (ref 26.0–34.0)
MCHC: 30.7 g/dL (ref 30.0–36.0)
MCV: 92.5 fL (ref 80.0–100.0)
Platelets: 175 K/uL (ref 150–400)
RBC: 2.92 MIL/uL — ABNORMAL LOW (ref 4.22–5.81)
RDW: 18.4 % — ABNORMAL HIGH (ref 11.5–15.5)
WBC: 11.5 K/uL — ABNORMAL HIGH (ref 4.0–10.5)
nRBC: 0 % (ref 0.0–0.2)

## 2024-05-07 MED ORDER — SODIUM ZIRCONIUM CYCLOSILICATE 10 G PO PACK
10.0000 g | PACK | Freq: Once | ORAL | Status: AC
  Administered 2024-05-07: 10 g
  Filled 2024-05-07: qty 1

## 2024-05-07 MED ORDER — METHOCARBAMOL 500 MG PO TABS
750.0000 mg | ORAL_TABLET | Freq: Three times a day (TID) | ORAL | Status: DC
  Administered 2024-05-07 – 2024-05-16 (×27): 750 mg
  Filled 2024-05-07 (×27): qty 2

## 2024-05-07 MED ORDER — HYDROMORPHONE HCL 1 MG/ML IJ SOLN
1.0000 mg | INTRAMUSCULAR | Status: DC | PRN
  Administered 2024-05-07 – 2024-05-10 (×10): 1 mg via INTRAVENOUS
  Filled 2024-05-07 (×9): qty 1

## 2024-05-07 MED ORDER — GABAPENTIN 250 MG/5ML PO SOLN
100.0000 mg | Freq: Three times a day (TID) | ORAL | Status: DC
  Administered 2024-05-07 – 2024-05-11 (×12): 100 mg
  Filled 2024-05-07 (×13): qty 2

## 2024-05-07 MED ORDER — SENNOSIDES 8.8 MG/5ML PO SYRP
5.0000 mL | ORAL_SOLUTION | Freq: Two times a day (BID) | ORAL | Status: DC
  Administered 2024-05-07 – 2024-05-10 (×6): 5 mL via ORAL
  Filled 2024-05-07 (×7): qty 5

## 2024-05-07 MED ORDER — POLYETHYLENE GLYCOL 3350 17 G PO PACK
17.0000 g | PACK | Freq: Two times a day (BID) | ORAL | Status: DC
  Administered 2024-05-07 (×2): 17 g
  Filled 2024-05-07 (×2): qty 1

## 2024-05-07 MED ORDER — PANTOPRAZOLE SODIUM 40 MG IV SOLR: 40.0000 mg | INTRAVENOUS | Status: DC

## 2024-05-07 NOTE — Plan of Care (Signed)
   Problem: Education: Goal: Knowledge of General Education information will improve Description Including pain rating scale, medication(s)/side effects and non-pharmacologic comfort measures Outcome: Progressing   Problem: Health Behavior/Discharge Planning: Goal: Ability to manage health-related needs will improve Outcome: Progressing

## 2024-05-07 NOTE — Progress Notes (Addendum)
 Trauma/Critical Care Follow Up Note  Subjective:    Overnight Issues:   Objective:  Vital signs for last 24 hours: Temp:  [98 F (36.7 C)-99.8 F (37.7 C)] 99.8 F (37.7 C) (11/25 1138) Pulse Rate:  [54-109] 75 (11/25 1143) Resp:  [0-28] 11 (11/25 1143) BP: (74-134)/(58-104) 134/84 (11/25 1100) SpO2:  [81 %-100 %] 94 % (11/25 1143) FiO2 (%):  [30 %] 30 % (11/25 1143) Weight:  [75.3 kg] 75.3 kg (11/25 0401)  Hemodynamic parameters for last 24 hours:    Intake/Output from previous day: 11/24 0701 - 11/25 0700 In: 2587.7 [I.V.:379.5; WH/HU:8385.6; IV Piggyback:594] Out: 1650 [Urine:1650]  Intake/Output this shift: Total I/O In: 420 [NG/GT:420] Out: 300 [Urine:300]  Vent settings for last 24 hours: Vent Mode: PSV;CPAP FiO2 (%):  [30 %] 30 % Set Rate:  [24 bmp] 24 bmp Vt Set:  [470 mL] 470 mL PEEP:  [5 cmH20] 5 cmH20 Pressure Support:  [5 cmH20] 5 cmH20 Plateau Pressure:  [12 cmH20-14 cmH20] 12 cmH20  Physical Exam:  Gen: comfortable, no distress Neuro: follows commands HEENT: PERRL Neck: c-collar in place CV: RRR Pulm: unlabored breathing on mechanical ventilation-full support Abd: soft, NT  , +BM GU: urine clear and yellow, +Foley Extr: wwp, no edema  Results for orders placed or performed during the hospital encounter of 04/14/24 (from the past 24 hours)  Glucose, capillary     Status: None   Collection Time: 05/06/24 12:10 PM  Result Value Ref Range   Glucose-Capillary 89 70 - 99 mg/dL  Glucose, capillary     Status: None   Collection Time: 05/06/24  3:02 PM  Result Value Ref Range   Glucose-Capillary 90 70 - 99 mg/dL  Glucose, capillary     Status: Abnormal   Collection Time: 05/06/24  8:12 PM  Result Value Ref Range   Glucose-Capillary 111 (H) 70 - 99 mg/dL  Glucose, capillary     Status: Abnormal   Collection Time: 05/06/24 11:36 PM  Result Value Ref Range   Glucose-Capillary 105 (H) 70 - 99 mg/dL  Glucose, capillary     Status: None    Collection Time: 05/07/24  3:52 AM  Result Value Ref Range   Glucose-Capillary 83 70 - 99 mg/dL  Glucose, capillary     Status: Abnormal   Collection Time: 05/07/24  7:18 AM  Result Value Ref Range   Glucose-Capillary 107 (H) 70 - 99 mg/dL  CBC     Status: Abnormal   Collection Time: 05/07/24 10:04 AM  Result Value Ref Range   WBC 11.5 (H) 4.0 - 10.5 K/uL   RBC 2.92 (L) 4.22 - 5.81 MIL/uL   Hemoglobin 8.3 (L) 13.0 - 17.0 g/dL   HCT 72.9 (L) 60.9 - 47.9 %   MCV 92.5 80.0 - 100.0 fL   MCH 28.4 26.0 - 34.0 pg   MCHC 30.7 30.0 - 36.0 g/dL   RDW 81.5 (H) 88.4 - 84.4 %   Platelets 175 150 - 400 K/uL   nRBC 0.0 0.0 - 0.2 %  Basic metabolic panel     Status: Abnormal   Collection Time: 05/07/24 10:04 AM  Result Value Ref Range   Sodium 134 (L) 135 - 145 mmol/L   Potassium 5.2 (H) 3.5 - 5.1 mmol/L   Chloride 100 98 - 111 mmol/L   CO2 22 22 - 32 mmol/L   Glucose, Bld 103 (H) 70 - 99 mg/dL   BUN 891 (H) 6 - 20 mg/dL   Creatinine, Ser  2.83 (H) 0.61 - 1.24 mg/dL   Calcium  8.1 (L) 8.9 - 10.3 mg/dL   GFR, Estimated 27 (L) >60 mL/min   Anion gap 12 5 - 15  Glucose, capillary     Status: None   Collection Time: 05/07/24 11:40 AM  Result Value Ref Range   Glucose-Capillary 97 70 - 99 mg/dL    Assessment & Plan: The plan of care was discussed with the bedside nurse for the day, who is in agreement with this plan and no additional concerns were raised.   Present on Admission:  Sepsis (HCC)  Severe sepsis (HCC)  MSSA bacteremia  Acute respiratory failure with hypoxia and hypercapnia (HCC)  Vertebral osteomyelitis (HCC)    LOS: 23 days   Additional comments:I reviewed the patient's new clinical lab test results.   and I reviewed the patients new imaging test results.    Prolonged mechanical ventilation - s/p trach 11/24 by Dr.Thompson. Hemostatic, doing well on the vent. Dispo - ICU, okay for DVT ppx, sutures out in 7d, okay for RT to downsize trach 12/1 or after. Surgery to sign  off.    Dreama GEANNIE Hanger, MD Trauma & General Surgery Please use [email or web address] to contact on call provider  05/07/2024  *Care during the described time interval was provided by me. I have reviewed this patient's available data, including medical history, events of note, physical examination and test results as part of my evaluation.

## 2024-05-07 NOTE — Progress Notes (Signed)
 Physical Therapy Treatment Patient Details Name: [name] [name] MRN: [id number] DOB: Aug 16, 1975 Today's Date: 05/07/2024   History of Present Illness 48 yo M recently admitted with MSSA PNA MSSA bacteremia psoas abscesses and lumbar osteo 03/14/24-03/25/24 (left AMA) who presented to [facility] ED 04/14/24 w AMS. Admitted with sepsis and transferred to [facility]. Arrived to ED covered in feces, altered, with poor airway protection and was intubated, Had PEA cardiac arrest on 11/3 with 4 mins CPR extubated on 11/9. 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 PMH: includes: anxiety, HTN    PT Comments  Pt in bed with the HoB elevated slightly, wide awake and happy to see therapy. RN in room giving IV fentanyl  for maximal therapy participation. Pt bed placed in chair position, with stops for shooting pain down his R LE. Able to reach full egress position where OT was able to removed C-collar and clean neck and do skin check. Collar pads are very soiled have asked for order for new pads be ordered from [employer]. C-collar returned. Pt asking for more pain medication due to back and hip pain. Pt told he can't get any more pain medication. Educated on movement for relief of pain, pt agreeable to PT provided prolonged stretching of LE to see if that helps. Pt still with pain. Pt writes on his board that he wouldn't treat a dog like this PT had frank discussion about the level of pain and the source in the infected spine, that may not get better. Pt voices understanding but asking for more pain medication. Pt unable to tolerate standing or out of bed due to pain. RN in room with gabapentin  and klonopin  at end of session. D/c plans remain appropriate. PT will continue to follow acutely.     If plan is discharge home, recommend the following: Assist for  transportation;Help with stairs or ramp for entrance;A lot of help with bathing/dressing/bathroom;Assistance with cooking/housework;Direct supervision/assist for medications management;Direct supervision/assist for financial management;Two people to help with walking and/or transfers   Can travel by private vehicle     No  Equipment Recommendations  None recommended by PT       Precautions / Restrictions Precautions Precautions: Fall;Cervical;Back Precaution Booklet Issued: No Precaution/Restrictions Comments: trach, Cortrak, indewelling catheter Required Braces or Orthoses: Cervical Brace Cervical Brace: Hard collar (can don at EOB, can be removed to walk to the bathroom (per active orders)) Restrictions Weight Bearing Restrictions Per Provider Order: No     Mobility  Bed Mobility Overal bed mobility: Needs Assistance Bed Mobility: Rolling Rolling: Total assist, +2 for physical assistance   Supine to sit: Total assist Sit to supine: Total assist   General bed mobility comments: total Ax2 for rolling to remove and replace pillows under R hip, utilized bed egress positioning to work on LE stretching    Transfers                   General transfer comment: unable to try egress due to increased back spasms       Balance       Sitting balance - Comments: unable due to pain                                    Communication Communication Communication: Impaired;Other (comment) (able to  answer questions and asks for things using clip board and marker) Factors Affecting Communication: Trach/intubated  Cognition Arousal: Alert Behavior During Therapy: [facility] for tasks assessed/performed     Difficult to assess due to: Intubated                     PT - Cognition Comments: able to write down needs on clip board Following commands: Impaired Following commands impaired: Follows one step commands with increased time, Follows one step commands  inconsistently    Cueing Cueing Techniques: Verbal cues, Tactile cues  Exercises General Exercises - Lower Extremity Ankle Circles/Pumps: PROM, Both (stretch) Long Arc Quad: PROM, Both, Seated (stretch) Hip Flexion/Marching: PROM, Seated, Both (stretch)    General Comments General comments (skin integrity, edema, etc.): on Ventilator, FiO2 30%O2, 5 PEEP, SpO2 92-100% SpO2, Supine BP 102/66, chair position 107/82 and 96/63 at end of session HoB elevated      Pertinent Vitals/Pain Pain Assessment Pain Assessment: Faces Faces Pain Scale: Hurts worst Pain Location: back spasms with pulling up in the bed and  radiating pain down RLE with movement in the bed Pain Descriptors / Indicators: Discomfort, Grimacing, Shooting, Radiating, Stabbing Pain Intervention(s): Limited activity within patient's tolerance, Repositioned, Patient requesting pain meds-RN notified     PT Goals (current goals can now be found in the care plan section) Acute Rehab PT Goals PT Goal Formulation: Patient unable to participate in goal setting Time For Goal Achievement: 05/09/24 Potential to Achieve Goals: Poor Progress towards PT goals: Progressing toward goals    Frequency    Min 2X/week       Co-evaluation PT/OT/SLP Co-Evaluation/Treatment: Yes Reason for Co-Treatment: Complexity of the patient's impairments (multi-system involvement);To address functional/ADL transfers PT goals addressed during session: Strengthening/ROM OT goals addressed during session: ADL's and self-care      AM-PAC PT 6 Clicks Mobility   Outcome Measure  Help needed turning from your back to your side while in a flat bed without using bedrails?: Total Help needed moving from lying on your back to sitting on the side of a flat bed without using bedrails?: Total Help needed moving to and from a bed to a chair (including a wheelchair)?: Total Help needed standing up from a chair using your arms (e.g., wheelchair or bedside  chair)?: Total Help needed to walk in hospital room?: Total Help needed climbing 3-5 steps with a railing? : Total 6 Click Score: 6    End of Session Equipment Utilized During Treatment: Oxygen Activity Tolerance: Patient limited by pain;Patient tolerated treatment well (intubation) Patient left: in bed;with call bell/phone within reach;with bed alarm set;with family/visitor present Nurse Communication: Mobility status;Other (comment) (Need for Prevalon boots to protect heels from pressure injury) PT Visit Diagnosis: Unsteadiness on feet (R26.81);Muscle weakness (generalized) (M62.81);Difficulty in walking, not elsewhere classified (R26.2)     Time: 0812-0902 PT Time Calculation (min) (ACUTE ONLY): 50 min  Charges:    $Therapeutic Exercise: 8-22 mins PT General Charges $$ ACUTE PT VISIT: 1 Visit                     Norabelle Kondo B. Fleeta Lapidus PT, DPT Acute Rehabilitation Services Please use secure chat or  Call Office [phone]    [name] 05/07/2024, 9:20 AM

## 2024-05-07 NOTE — Progress Notes (Signed)
 Occupational Therapy Treatment Patient Details Name: [name] [name] MRN: [id number] DOB: 08-22-1975 Today's Date: 05/07/2024   History of present illness 48 yo M recently admitted with MSSA PNA MSSA bacteremia psoas abscesses and lumbar osteo 03/14/24-03/25/24 (left AMA) who presented to [facility] ED 04/14/24 w AMS. Admitted with sepsis and transferred to [facility]. Arrived to ED covered in feces, altered, with poor airway protection and was intubated, Had PEA cardiac arrest on 11/3 with 4 mins CPR extubated on 11/9. 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 PMH: includes: anxiety, HTN   OT comments  Pt greeted in bed, agreeable for OT visit. Pre-medicated prior to OT arrival. Pt s/p trach placement yesterday. Pt able to progress HOB elevation better today, but unable to tolerate attempted bed egress 2/2 pain and back spasms. He was min A for simple grooming in bed, and max A for UB dressing and bathing (removed HCC and assisted with washing neck/chest). BP dropped slightly with bed in chair position, but pt asymptomatic - see below. AMPAC remains 11/24 indicating poor functional performance. OT to continue to follow.  Supine BP: 107/82 Bed in chair position BP: 92/65 (73) SpO2 ~92-94% on vent via trach      If plan is discharge home, recommend the following:  Two people to help with walking and/or transfers;Two people to help with bathing/dressing/bathroom;Direct supervision/assist for medications management;Direct supervision/assist for financial management;Assist for transportation;Help with stairs or ramp for entrance   Equipment Recommendations  Other (comment) (defer)    Recommendations for Other Services      Precautions / Restrictions Precautions Precautions: Fall;Cervical;Back Precaution Booklet Issued:  No Precaution/Restrictions Comments: Cortrak, trach to vent Required Braces or Orthoses: Cervical Brace Cervical Brace: Hard collar (can don at EOB, can be removed to walk to the bathroom (per active orders)) Restrictions Weight Bearing Restrictions Per Provider Order: No       Mobility Bed Mobility Overal bed mobility: Needs Assistance Bed Mobility: Rolling Rolling: Total assist, +2 for physical assistance         General bed mobility comments: partial rolling in supine for pressure relief and repositioning; cued to reach for bed rails and use BUE for offloading buttocks    Transfers                   General transfer comment: intention to egress bed, unable 2/2 high pain levels     Balance                                           ADL either performed or assessed with clinical judgement   ADL Overall ADL's : Needs assistance/impaired Eating/Feeding: NPO Eating/Feeding Details (indicate cue type and reason): Cortrak Grooming: Minimal assistance;Bed level Grooming Details (indicate cue type and reason): assist for thoroughness with washing face with HOB elevated, able to open and utilize bottle of lip moisturizer         Upper Body Dressing : Maximal assistance;Bed level Upper Body Dressing Details (indicate cue type and reason): doffing/re-applying HCC         Toileting- Clothing Manipulation and Hygiene: Total assistance Toileting - Clothing Manipulation Details (indicate cue type and reason): foley            Extremity/Trunk Assessment  Vision       Perception     Praxis     Communication Communication Communication: Impaired;Other (comment) (using pencil & clipboard for written communication) Factors Affecting Communication: Trach/intubated   Cognition Arousal: Alert Behavior During Therapy: WFL for tasks assessed/performed                                 Following commands:  Impaired Following commands impaired: Follows one step commands with increased time, Follows one step commands inconsistently      Cueing   Cueing Techniques: Verbal cues, Tactile cues  Exercises      Shoulder Instructions       General Comments trach to vent with stable O2 sats; BP 107/82 in supine with HOB elevated, BP with bed in chair position (pt asymptomatic): 92/65 (73)    Pertinent Vitals/ Pain       Pain Assessment Pain Assessment: Faces Faces Pain Scale: Hurts worst Pain Location: intermittent back spasms with mobility in bed/HOB elevation Pain Descriptors / Indicators: Discomfort, Grimacing, Shooting, Radiating, Stabbing, Guarding Pain Intervention(s): Limited activity within patient's tolerance, Monitored during session, Repositioned  Home Living                                          Prior Functioning/Environment              Frequency  Min 2X/week        Progress Toward Goals  OT Goals(current goals can now be found in the care plan section)  Progress towards OT goals: Progressing toward goals (slowly)     Plan      Co-evaluation    PT/OT/SLP Co-Evaluation/Treatment: Yes Reason for Co-Treatment: Complexity of the patient's impairments (multi-system involvement);To address functional/ADL transfers PT goals addressed during session: Strengthening/ROM OT goals addressed during session: ADL's and self-care      AM-PAC OT 6 Clicks Daily Activity     Outcome Measure   Help from another person eating meals?: Total Help from another person taking care of personal grooming?: A Little Help from another person toileting, which includes using toliet, bedpan, or urinal?: Total Help from another person bathing (including washing, rinsing, drying)?: A Lot Help from another person to put on and taking off regular upper body clothing?: A Lot Help from another person to put on and taking off regular lower body clothing?: A Lot 6  Click Score: 11    End of Session Equipment Utilized During Treatment: Oxygen;Cervical collar (vent to trach)  OT Visit Diagnosis: Unsteadiness on feet (R26.81);Muscle weakness (generalized) (M62.81);Pain   Activity Tolerance Patient tolerated treatment well;Patient limited by pain   Patient Left in bed;with call bell/phone within reach;with bed alarm set;with nursing/sitter in room   Nurse Communication Other (comment);Patient requests pain meds (status of session)        Time: 9197-9157 OT Time Calculation (min): 40 min  Charges: OT General Charges $OT Visit: 1 Visit OT Treatments $Self Care/Home Management : 23-37 mins  Remmi Armenteros M. [place], OTR/L [facility] Acute Rehabilitation Services [phone] Secure Chat Preferred  Rikki Burma 05/07/2024, 10:57 AM

## 2024-05-07 NOTE — Progress Notes (Signed)
 NAME:  [name] [name], MRN:  [id number], DOB:  20-Mar-1976, LOS: 23 ADMISSION DATE:  04/14/2024, CONSULTATION DATE:  04/14/2024 REFERRING MD:  [place] - [facility] EDP, CHIEF COMPLAINT:  AMS  History of Present Illness:  48 year old man who presented to [facility] ED 11/2 with AMS. PMHx significant for MSSA bacteremia, MSSA PNA, infective endocarditis, cervical through lumbar osteomyelitis, psoas abscess, severe protein calorie malnutrition/FTT, substance abuse. Recent admission 10/2 - 10/13 for sepsis, widespread MSSA.  Arrived to [facility] ED covered in feces, altered and with poor airway protection; he was subsequently intubated. CXR demonstrated RLL opacities. Confirmed bilateral septic pulmonary emboli, tricuspid valve endocarditis.  Required intubation and mechanical ventilation. Clinically progressed and was transferred out of ICU 11/10.  Reconsulted 11/13 for AMS, tachypnea and tachycardia.   Pertinent Medical History:  MSSA bacteremia MSSA PNA Lumbar osteomyelitis  Psoas abscess  Severe protein calorie malnutrition FTT in adult  Significant Hospital Events: Including procedures, antibiotic start and stop dates in addition to other pertinent events   11/2 [facility] ED w AMS. Intubated. Started on vanc mero. Txf request to [place] to [facility]  11/3 Extubated, developed respiratory distress requiring reintubation. Brief cardiac arrest during intubation.  11/3 Transthoracic echocardiogram > LVEF 40-45%, global hypokinesis, grade 1 diastolic dysfunction, normal RV function.  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  No mitral or aortic valve involvement 11/3 CT Chest, abdomen, pelvis >> numerous bilateral cavitary and solid nodules consistent with septic emboli, left lower lobe consolidation with a small pleural effusion, right basilar atelectasis.  Moderate free fluid in the pelvis.  Erosive changes at T5-T6, L2-L3, L4-L5 suggestive of discitis 11/3 CT Head >> no acute abnormality.   Opacified left maxillary sinus 11/10 Transferred out of ICU. 11/13 PCCM reconsulted for decreased mental status, tachycardia, tachypnea. Started diuretics 11/16 Poorly responsive, transferred back to ICU and intubated for the third time. Pressors started post intubation. Renal function worse. UOP poor.  11/20 NO acute events overnight, awake and following commands.  Patient and spouse met with palliative care and collectively decision was made to proceed with trach and PEG if needed 11/21 no acute events overnight, hemoglobin remained stable 11/23 pain, discomfort, muscle spasms 11/24 CCS trach, cortrak  Interim History/Subjective:  Off precedex  since yesterday Tolerating SBT this am but having anxiety, feels like he is going to die at time, holding his breath, and c/o of leg spasms, bottom soreness, and headache.  Is redirectable with reassurance.  Worked with PT this am up in chair position   Objective:   Blood pressure 102/66, pulse 91, temperature 98.4 F (36.9 C), temperature source Oral, resp. rate (!) 22, height 6' (1.829 m), weight 75.3 kg, SpO2 99%.    Vent Mode: PRVC FiO2 (%):  [30 %] 30 % Set Rate:  [24 bmp] 24 bmp Vt Set:  [470 mL] 470 mL PEEP:  [5 cmH20] 5 cmH20 Plateau Pressure:  [12 cmH20-14 cmH20] 12 cmH20   Intake/Output Summary (Last 24 hours) at 05/07/2024 0831 Last data filed at 05/07/2024 0800 Gross per 24 hour  Intake 2606.88 ml  Output 1650 ml  Net 956.88 ml   Filed Weights   05/05/24 0412 05/06/24 0433 05/07/24 0401  Weight: 70.5 kg 75.7 kg 75.3 kg   Physical Examination: General:  AoC ill appearing thin adult male  HEENT: MM pink/moist, c-collar, midline sutured trach, some dried blood, cortrak Neuro: Awake, writing and mouthing words, UE 3-4/5, 2/5 in LE CV: rr, NSR 60's PULM:  currently on PSV getting good volumes, scattered rhonchi clears with strong cough/ whitish secretions GI: soft, bs+, NT, foley- cyu Extremities: warm/dry, more pedal 2+  edema  Skin: no rashes   UOP 1.6L/ /24hrs +1L, net +1.5 Last stool 11/21 and small smear 11/24  Labs> WBC 9.5> 11.5, H/H 7.9> 9.3/ 29> 8.3/ 27, Na 134, K 5.2, sCr 2.87> 2.83  Resolved Problem List:  Postintubation hypotension Metabolic encephalopathy secondary to hypercarbia, resolved  Assessment and Plan:   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  Anxiety is contributing to vent liberation but having good volumes on 5/5.  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  Bradycardia resolved today - cont monitor hemodynamics    Anxiety On opiates for pain management - History of substance abuse and chronic anxiety P:  - missed several meds yesterday with trach and cortrak placement yesterday.  Did not receive except 1 dose of clonidine  yest am but has since remained off precedex  therefore will stop clonidine .  Still having lots of anxiety, pain and c/o of muscle spasms and holding his  breath this am, query that underlying behavioral issues are contributing.  Continue multimodal management with tylenol , increase robaxin  500mg  q8 to 750mg  q8, increase gabapentin  100mg  BID to TID (monitor renal function), lidoderm  patch prn, scheduled oxy10 q6hrs and change fentanyl  to dilaudid .  Increasing scheduled bowel regimen as last BM 11/21 and small smear 11/24   Nonoliguric AKI Urinary retention s/p foley placement by urology 11/16 Hyperkalemia - without EKG changes, improving - Nephrology s/o 11/21 P: - stable UOP, sCr slowly improving - additional lokelmia today  - to keep foley in till 11/26 per urology - repeat renal indices in am - strict I/Os, daily wts - avoid nephrotoxins, renal dose meds, hemodynamic support as above   Chronic anemia - overall stable H/H  - trend CBC periodically - transfuse per protocol    Protein calorie malnutrition -  cont EN per RD via cortrak, MVI and FWF   Stage II pressure ulcer sacrum, present on admission - cont wound care - maximize nutrition, frequent turns   CRITICAL CARE Performed by: Lyle Pesa   Total critical care time: 35 minutes  Critical care time was exclusive of separately billable procedures and treating other patients.  Critical care was necessary to treat or prevent imminent or life-threatening deterioration.  Critical care was time spent personally by me on the following activities: development of treatment plan with patient and/or surrogate as well as nursing, discussions with  consultants, evaluation of patient's response to treatment, examination of patient, obtaining history from patient or surrogate, ordering and performing treatments and interventions, ordering and review of laboratory studies, ordering and review of radiographic studies, pulse oximetry and re-evaluation of patient's condition.    Lyle Pesa, NP  Pulmonary & Critical Care 05/07/2024, 8:31 AM  See Amion for pager If no  response to pager , please call [phone] until 7pm After 7:00 pm call Elink  [phone]

## 2024-05-07 NOTE — Progress Notes (Addendum)
 Daily Progress Note   Patient Name: [name] [name]       Date: 05/07/2024 DOB: 07/20/1975  Age: 48 y.o. MRN#: [id number] Attending Physician: Geronimo Amel, MD Primary Care Physician: Bertell Satterfield, MD Admit Date: 04/14/2024  Reason for Consultation/Follow-up: Establishing goals of care  Subjective: Notes and labs reviewed.  In to see patient.  No family at bedside.  He is currently resting in bed at this time, no distress noted.  Ventilator in place.  He has a set designer.  He is also able to shake and nod his head.  He denies complaint at this time.  With discussing his trach and planning, he nodded his head he understood his current status and care moving forward.  When I ask if he is amenable to his current care, he nodded that he is.  He indicated that he would want any care including CPR.  He became tearful and wrote on his paper [facility] and again indicated he wanted to try what he could to live longer.  He states he is thankful for his care.     Length of Stay: 23  Current Medications: Scheduled Meds:   Chlorhexidine  Gluconate Cloth  6 each Topical Daily   clonazePAM   2 mg Per Tube BID   feeding supplement (PROSource TF20)  60 mL Per Tube Daily   folic acid   1 mg Per Tube Daily   free water   200 mL Per Tube Q6H   gabapentin   100 mg Per Tube Q8H   guaiFENesin   5 mL Per Tube Q4H   heparin  injection (subcutaneous)  5,000 Units Subcutaneous Q8H   lidocaine   1 patch Transdermal Q24H   methocarbamol   750 mg Per Tube Q8H   midodrine   5 mg Per Tube TID WC   multivitamin with minerals  1 tablet Per Tube Daily   mouth rinse  15 mL Mouth Rinse Q2H   oxyCODONE   10 mg Per Tube Q6H   polyethylene glycol  17 g Per Tube BID   sennosides  5 mL Oral BID    Continuous  Infusions:   ceFAZolin  (ANCEF ) IV 2 g (05/07/24 1315)   feeding supplement (OSMOLITE 1.5 CAL) 55 mL/hr at 05/07/24 1300    PRN Meds: acetaminophen , HYDROmorphone  (DILAUDID ) injection, ipratropium-albuterol , ondansetron  (ZOFRAN ) IV, mouth rinse  Physical Exam Pulmonary:  Effort: Pulmonary effort is normal.     Comments: Trach connected to ventilator Skin:    General: Skin is warm and dry.  Neurological:     Mental Status: He is alert.             Vital Signs: BP 97/70   Pulse (!) 57   Temp 99.8 F (37.7 C) (Oral)   Resp 18   Ht 6' (1.829 m)   Wt 75.3 kg   SpO2 95%   BMI 22.51 kg/m  SpO2: SpO2: 95 % O2 Device: O2 Device: Ventilator O2 Flow Rate: O2 Flow Rate (L/min): 5 L/min  Intake/output summary:  Intake/Output Summary (Last 24 hours) at 05/07/2024 1450 Last data filed at 05/07/2024 1300 Gross per 24 hour  Intake 2592.06 ml  Output 1350 ml  Net 1242.06 ml   LBM: Last BM Date : 05/06/24 Baseline Weight: Weight: 62.5 kg Most recent weight: Weight: 75.3 kg   Patient Active Problem List   Diagnosis Date Noted   Psoas abscess (HCC) 04/30/2024   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   Acute septic pulmonary embolism without acute cor pulmonale (HCC) 04/30/2024   Sepsis (HCC) 04/14/2024   Severe sepsis (HCC) 04/14/2024   Protein-calorie malnutrition, severe 03/22/2024   Septic embolism (HCC) 03/15/2024   Pressure injury of skin 03/15/2024   Lactic acidosis 03/15/2024   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   Thrombocytopenia 03/15/2024   Transaminasemia 03/15/2024   Hypoalbuminemia 03/15/2024   Failure to thrive in adult 03/15/2024   Sepsis due to pneumonia (HCC) 11/07/2016   Leukopenia 11/07/2016   Acute respiratory failure with hypoxia and hypercapnia (HCC) 11/07/2016   Anxiety disorder 11/07/2016   Hyperbilirubinemia 11/07/2016   Fever 01/18/2014   MSSA bacteremia 01/18/2014   CAP (community acquired pneumonia) 05/19/2013   Sinus  tachycardia 05/19/2013   Tobacco abuse 05/19/2013    Palliative Care Assessment & Plan    Recommendations/Plan: Continue full code and full scope PMT will step away from daily visits and plan to follow-up Saturday.  Please reach out for immediate needs  Code Status:    Code Status Orders  (From admission, onward)           Start     Ordered   04/14/24 2038  Full code  Continuous       Question:  By:  Answer:  Consent: discussion documented in EHR   04/14/24 2037           Code Status History     Date Active Date Inactive Code Status Order ID Comments User Context   03/15/2024 0332 03/26/2024 0014 Full Code [id number]  Manfred Driver, DO Inpatient   11/07/2016 1703 11/09/2016 1641 Full Code [id number]  Charlton Evalene RAMAN, MD ED   01/26/2015 2119 01/27/2015 1458 Full Code [id number]  Elgergawy, Brayton RAMAN, MD Inpatient   01/23/2014 2307 01/24/2014 1744 Full Code [id number]  Fernand Elfreda LABOR, MD Inpatient   01/18/2014 1415 01/21/2014 1943 Full Code [id number]  Cindy Garnette POUR, MD ED   05/19/2013 0138 05/19/2013 2014 Full Code [id number]  Verdene Purchase, MD Inpatient     Camelia Lewis, NP  Please contact Palliative Medicine Team phone at [phone] for questions and concerns.

## 2024-05-07 NOTE — Progress Notes (Signed)
 Nutrition Follow-up  DOCUMENTATION CODES:  Severe malnutrition in context of social or environmental circumstances  INTERVENTION:  Continue TF via Cortrak: Osmolite 1.5 at 55ml/hr (1320ml per day)  60ml ProSource TF20 once daily Provides 2060 kcal, 103g protein, free water  daily   Free water  flushes 200ml q6h (800ml daily) Total free water  (TF+FWF)-   Monitor for SLP recommendations regarding diet once able to SBT and tolerate trach collar.   NUTRITION DIAGNOSIS:  Severe Malnutrition related to social / environmental circumstances (IVDU and alcohol abuse) as evidenced by severe fat depletion, severe muscle depletion. - remains applicable  GOAL:  Patient will meet greater than or equal to 90% of their needs - goal met via TF  MONITOR:  Vent status, TF tolerance, Labs  REASON FOR ASSESSMENT:  Consult, Ventilator Enteral/tube feeding initiation and management  ASSESSMENT: Pt admitted with metabolic encephalopathy and presumed septic shock d/t metastatic MSSA infection with concern for endocarditis. PMH significant for recent admission (10/2-10/13 leaving AMA) for MSSA, PNA, psoas abscesses and lumbar osteomyelitis, tobacco use, IVDU and alcohol use  11/2: admitted, transferred from [facility] to [facility] 11/3: extubated; respiratory distress; re-intubated, cardiac arrest, ROSC; echo>LVEF 40-45%, normal RV function 11/7: cortrak placed 11/6: extubated; diet advanced to regular 11/10: transfer out of ICU 11/16: AMS, tachypnea, tachycardic; re-intubated d/t respiratory failure 11/20: s/p bronchoscopy; PMT meeting- full scope, ok with trach/PEG; CT chest -extensive atelectasis, increasing areas of cavitation, pleural effusions, erosive changes at T5-T6, L2-L3, and L4-L5 concerning for osteomyelitis/discitis 11/24: s/p trach placement; Cortrak placed  Patient discussed in rounds.  Patient remains on IV abx treatment.   Per RN patient continues with SBT however anxiety is  hindering vent liberation.  PEG tube deferred, Cortrak placed and planning for SLP evaluation when tolerating ATC.  Admit weight: 62.5 kg Current weight: 75.3 kg (+very deep pitting BLE)  UOP: x24 hours  Medications: folvite , MVI, miralax  BID, senna BID  Labs:  CBG's 83-111 x24 hours Sodium 134 Potassium 5.2 (+1 dose of lokelma ) BUN 108 Cr 2.83 (trending down) GFR 27 (slowly trending up)  Diet Order:   Diet Order             Diet NPO time specified  Diet effective midnight                   EDUCATION NEEDS:   Not appropriate for education at this time  Skin:  Skin Integrity Issues:: Stage I Stage I: right coccyx Stage II: Per WOC: 2 ruptured blisters 2 cm x 1 cm x 0.1 cm and 0.5 cm x 0.5 cm x 0.1 cm.  Last BM:  11/25 type 5 medium  Height:   Ht Readings from Last 1 Encounters:  04/19/24 6' (1.829 m)    Weight:   Wt Readings from Last 1 Encounters:  05/07/24 75.3 kg   MI:  Body mass index is 22.51 kg/m.  Estimated Nutritional Needs:   Kcal:  2000-2200  Protein:  100-115g  Fluid:  >/=2L  [name] [name], RDN, LDN Clinical Nutrition See AMiON for contact information.

## 2024-05-08 ENCOUNTER — Inpatient Hospital Stay (HOSPITAL_COMMUNITY): Payer: MEDICAID

## 2024-05-08 DIAGNOSIS — I369 Nonrheumatic tricuspid valve disorder, unspecified: Secondary | ICD-10-CM

## 2024-05-08 LAB — BASIC METABOLIC PANEL WITH GFR
Anion gap: 17 — ABNORMAL HIGH (ref 5–15)
Anion gap: 15 (ref 5–15)
BUN: 110 mg/dL — ABNORMAL HIGH (ref 6–20)
BUN: 110 mg/dL — ABNORMAL HIGH (ref 6–20)
CO2: 20 mmol/L — ABNORMAL LOW (ref 22–32)
CO2: 19 mmol/L — ABNORMAL LOW (ref 22–32)
Calcium: 8.4 mg/dL — ABNORMAL LOW (ref 8.9–10.3)
Calcium: 8.1 mg/dL — ABNORMAL LOW (ref 8.9–10.3)
Chloride: 96 mmol/L — ABNORMAL LOW (ref 98–111)
Chloride: 98 mmol/L (ref 98–111)
Creatinine, Ser: 2.77 mg/dL — ABNORMAL HIGH (ref 0.61–1.24)
Creatinine, Ser: 2.78 mg/dL — ABNORMAL HIGH (ref 0.61–1.24)
GFR, Estimated: 28 mL/min — ABNORMAL LOW (ref >60)
GFR, Estimated: 28 mL/min — ABNORMAL LOW (ref >60)
Glucose, Bld: 102 mg/dL — ABNORMAL HIGH (ref 70–99)
Glucose, Bld: 96 mg/dL (ref 70–99)
Potassium: 6 mmol/L — ABNORMAL HIGH (ref 3.5–5.1)
Potassium: 6.2 mmol/L — ABNORMAL HIGH (ref 3.5–5.1)
Sodium: 133 mmol/L — ABNORMAL LOW (ref 135–145)
Sodium: 132 mmol/L — ABNORMAL LOW (ref 135–145)

## 2024-05-08 LAB — GLUCOSE, CAPILLARY
Glucose-Capillary: 85 mg/dL (ref 70–99)
Glucose-Capillary: 108 mg/dL — ABNORMAL HIGH (ref 70–99)
Glucose-Capillary: 88 mg/dL (ref 70–99)
Glucose-Capillary: 97 mg/dL (ref 70–99)
Glucose-Capillary: 82 mg/dL (ref 70–99)
Glucose-Capillary: 84 mg/dL (ref 70–99)
Glucose-Capillary: 80 mg/dL (ref 70–99)

## 2024-05-08 LAB — POTASSIUM: Potassium: 5.4 mmol/L — ABNORMAL HIGH (ref 3.5–5.1)

## 2024-05-08 LAB — MAGNESIUM: Magnesium: 2.1 mg/dL (ref 1.7–2.4)

## 2024-05-08 LAB — PHOSPHORUS: Phosphorus: 6.6 mg/dL — ABNORMAL HIGH (ref 2.5–4.6)

## 2024-05-08 MED ORDER — DEXMEDETOMIDINE HCL IN NACL 400 MCG/100ML IV SOLN
0.0000 ug/kg/h | INTRAVENOUS | Status: DC
  Administered 2024-05-08 (×2): 0.4 ug/kg/h via INTRAVENOUS
  Filled 2024-05-08: qty 100

## 2024-05-08 MED ORDER — MIDAZOLAM HCL 2 MG/2ML IJ SOLN
INTRAMUSCULAR | Status: AC
Start: 2024-05-08 — End: 2024-05-08
  Filled 2024-05-08: qty 4

## 2024-05-08 MED ORDER — SODIUM BICARBONATE 650 MG PO TABS
650.0000 mg | ORAL_TABLET | Freq: Two times a day (BID) | ORAL | Status: DC
  Administered 2024-05-08 – 2024-05-10 (×5): 650 mg
  Filled 2024-05-08 (×5): qty 1

## 2024-05-08 MED ORDER — SODIUM ZIRCONIUM CYCLOSILICATE 10 G PO PACK
10.0000 g | PACK | ORAL | Status: AC
Start: 2024-05-08 — End: 2024-05-08
  Administered 2024-05-08 (×2): 10 g via ORAL
  Filled 2024-05-08 (×2): qty 1

## 2024-05-08 MED ORDER — POLYETHYLENE GLYCOL 3350 17 G PO PACK
17.0000 g | PACK | Freq: Every day | ORAL | Status: DC
  Administered 2024-05-09 – 2024-05-10 (×2): 17 g
  Filled 2024-05-08 (×3): qty 1

## 2024-05-08 MED ORDER — MIDAZOLAM HCL (PF) 2 MG/2ML IJ SOLN
1.0000 mg | INTRAMUSCULAR | Status: DC | PRN
  Administered 2024-05-08: 2 mg via INTRAVENOUS
  Administered 2024-05-08: 4 mg via INTRAVENOUS
  Administered 2024-05-08 – 2024-05-09 (×2): 2 mg via INTRAVENOUS
  Administered 2024-05-09: 4 mg via INTRAVENOUS
  Administered 2024-05-09: 2 mg via INTRAVENOUS
  Administered 2024-05-09: 4 mg via INTRAVENOUS
  Administered 2024-05-09 (×2): 2 mg via INTRAVENOUS
  Filled 2024-05-08: qty 2
  Filled 2024-05-08: qty 4
  Filled 2024-05-08: qty 2
  Filled 2024-05-08: qty 4
  Filled 2024-05-08 (×4): qty 2

## 2024-05-08 MED ORDER — SODIUM BICARBONATE 650 MG PO TABS: 650.0000 mg | ORAL_TABLET | Freq: Two times a day (BID) | ORAL | Status: DC

## 2024-05-08 MED ORDER — INSULIN ASPART 100 UNIT/ML IV SOLN
5.0000 [IU] | Freq: Once | INTRAVENOUS | Status: AC
  Administered 2024-05-08: 5 [IU] via INTRAVENOUS
  Filled 2024-05-08: qty 5

## 2024-05-08 MED ORDER — DEXTROSE 50 % IV SOLN
1.0000 | Freq: Once | INTRAVENOUS | Status: AC
  Administered 2024-05-08: 50 mL via INTRAVENOUS
  Filled 2024-05-08: qty 50

## 2024-05-08 MED ORDER — PROPOFOL 1000 MG/100ML IV EMUL
INTRAVENOUS | Status: AC
  Filled 2024-05-08: qty 100

## 2024-05-08 MED ORDER — LIDOCAINE HCL URETHRAL/MUCOSAL 2 % EX GEL
1.0000 | Freq: Once | CUTANEOUS | Status: AC
  Administered 2024-05-09: 1 via URETHRAL
  Filled 2024-05-08: qty 6

## 2024-05-08 MED ORDER — PROPOFOL 1000 MG/100ML IV EMUL
0.0000 ug/kg/min | INTRAVENOUS | Status: DC
  Administered 2024-05-08: 20 ug/kg/min via INTRAVENOUS

## 2024-05-08 NOTE — Progress Notes (Addendum)
 eLink Physician-Brief Progress Note Patient Name: [name] [name] DOB: 10-May-1976 MRN: [id number]   Date of Service  05/08/2024  HPI/Events of Note    Elink to do follow up BMP.  Reviewed. Persisting hyperkalemia even after 2 doses of lokelma  today, last dose from 2 PM, per RN discussion.   NP notes; as below;Nonoliguric AKI Urinary retention  Hyperkalemia, improving  - Nephrology s/o 11/21 - s/p foley placement by urology 11/16 given pendulous and bulbar urethral injuries / false pass  P: - pending BMET - remove foley today per urology notes, frequent bladder scan  Camera: Comfortable, VS stable. Trach to vent.  Has NG tube feeding going Awake. As per RN glucose 89.   eICU Interventions  - hyperkalemia protocol ordered Follow potassium, bladder scan prn for any retension. If persisting elevated need Nephrology help. On bicarb tablet q12 hrsly already.      Intervention Category Intermediate Interventions: Electrolyte abnormality - evaluation and management  [name] [name] 05/08/2024, 8:06 PM  21:15 Coude foley removed today at 6.30 pm by NP, no void.  Bladder scan = 450 ml.  Unable to get in and out cath. -discussed with RN. She will call resource nurse who can place Urology coude cath. Stat Coude urinary cath ordered. If they can not then call Urology resident on call Emma at [phone].   21;28 4W will place coude cath for this patient they just need an order to have one placed as well as an order for lidocaine  gel to help with insertion please?  Ordered 2% jelly  00:41: RN reports RN team unsuccessful w/coude foley placement...states pt needs Urology c/s for placement - RN is going to call Urology resident on call [facility], Urology consult stat ordered. Call back if not able to connect to resident, so we can call on call Urologist attending.

## 2024-05-08 NOTE — Progress Notes (Signed)
 NAME:  [name] [name], MRN:  [id number], DOB:  Jun 28, 1975, LOS: 24 ADMISSION DATE:  04/14/2024, CONSULTATION DATE:  04/14/2024 REFERRING MD:  [place] - [facility] EDP, CHIEF COMPLAINT:  AMS  History of Present Illness:  48 year old man who presented to [facility] ED 11/2 with AMS. PMHx significant for MSSA bacteremia, MSSA PNA, infective endocarditis, cervical through lumbar osteomyelitis, psoas abscess, severe protein calorie malnutrition/FTT, substance abuse. Recent admission 10/2 - 10/13 for sepsis, widespread MSSA.  Arrived to [facility] ED covered in feces, altered and with poor airway protection; he was subsequently intubated. CXR demonstrated RLL opacities. Confirmed bilateral septic pulmonary emboli, tricuspid valve endocarditis.  Required intubation and mechanical ventilation. Clinically progressed and was transferred out of ICU 11/10.  Reconsulted 11/13 for AMS, tachypnea and tachycardia.   Pertinent Medical History:  MSSA bacteremia MSSA PNA Lumbar osteomyelitis  Psoas abscess  Severe protein calorie malnutrition FTT in adult  Significant Hospital Events: Including procedures, antibiotic start and stop dates in addition to other pertinent events   11/2 [facility] ED w AMS. Intubated. Started on vanc mero. Txf request to [place] to [facility]  11/3 Extubated, developed respiratory distress requiring reintubation. Brief cardiac arrest during intubation.  11/3 Transthoracic echocardiogram > LVEF 40-45%, global hypokinesis, grade 1 diastolic dysfunction, normal RV function.  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  No mitral or aortic valve involvement 11/3 CT Chest, abdomen, pelvis >> numerous bilateral cavitary and solid nodules consistent with septic emboli, left lower lobe consolidation with a small pleural effusion, right basilar atelectasis.  Moderate free fluid in the pelvis.  Erosive changes at T5-T6, L2-L3, L4-L5 suggestive of discitis 11/3 CT Head >> no acute abnormality.   Opacified left maxillary sinus 11/10 Transferred out of ICU. 11/13 PCCM reconsulted for decreased mental status, tachycardia, tachypnea. Started diuretics 11/16 Poorly responsive, transferred back to ICU and intubated for the third time. Pressors started post intubation. Renal function worse. UOP poor.  11/20 NO acute events overnight, awake and following commands.  Patient and spouse met with palliative care and collectively decision was made to proceed with trach and PEG if needed 11/21 no acute events overnight, hemoglobin remained stable 11/23 pain, discomfort, muscle spasms 11/24 CCS trach, cortrak, off precedex  11/25 PSV 5/5 for 8hrs  Interim History/Subjective:  In better place mentally, motivated to continue to get better.  Thankful for care being provided C/o of ongoing neck and back pain Asking not to tell him when on PSV so he doesn't get more anxious  Objective:   Blood pressure 108/71, pulse 94, temperature 99.4 F (37.4 C), temperature source Oral, resp. rate 19, height 6' (1.829 m), weight 75.3 kg, SpO2 98%.    Vent Mode: PRVC FiO2 (%):  [30 %] 30 % Set Rate:  [18 bmp] 18 bmp Vt Set:  [470 mL] 470 mL PEEP:  [5 cmH20] 5 cmH20 Pressure Support:  [5 cmH20] 5 cmH20 Plateau Pressure:  [13 cmH20-16 cmH20] 16 cmH20   Intake/Output Summary (Last 24 hours) at 05/08/2024 0723 Last data filed at 05/08/2024 0700 Gross per 24 hour  Intake 2570 ml  Output 2150 ml  Net 420 ml   Filed Weights   05/06/24 0433 05/07/24 0401 05/08/24 0144  Weight: 75.7 kg 75.3 kg 75.3 kg   Physical Examination: General:  Pleasant adult male lying upright in bed in NAD HEENT: MM pink/moist, poor dentition, pupils 4/r, midline cuffed trach sutured, [place] j collar Neuro: Awake, oriented x 3, BUE 4/5, BLE 2/5 CV:  rr, NSR PULM:  non labored, coarse> strong cough with moderate amount of secretions, doing well on PSV 5/5 with TV ~500 GI: soft, bs+, NT, foley- cyu Extremities: warm/dry, +2 LE edema,  decreased muscle mass Skin: no rashes  UOP 2.1L/ /24hrs 2 stools overnight  Labs> not drawn yet  Resolved Problem List:  Postintubation hypotension Metabolic encephalopathy secondary to hypercarbia, resolved  Assessment and Plan:   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 MSSA septic pulmonary emboli C-spine septic arthritis Deconditioning - completed Zyvox  11/16 P:   - Appreciate ID recs  - 6-8 weeks of cefazolin  (11/2> end date 12/16) - 11/20 Bcxs negative - remains afebrile - c-collar, and LSO brace for comfort when upright> will reach out to ortho tech to follow up on > hopefully get OOB with PT once we have  - will need 6 week f/u with NSGY around week of 12/22, last seen 11/12, recs for medical management, not a surgical candidate at that  - PMT following peripherally, appreciate assistance, remains full code   Acute hypoxic and hypercapnic respiratory failure secondary to MSSA pneumonia, ARDS, mucous plugging Septic pulmonary emboli Generalized deconditioning Tracheostomy placement 11/24 P:  - cont daily SBT with goal for ATC, hopeful can go to ATC today - cont full LTVV support, 4-8cc/kg IBW with goal Pplat <30 and DP<15  - VAP prevention protocol/ PPI - PAD protocol for sedation> minimize now with trach, see below- stable CXR 11/24, CXR prn - wean FiO2 as able for SpO2 >92%  - aggressive pulmonary hygiene - PRN BD - SLP eventual when tolerating ATC, cont cortrak for now, NPO - OOB when have brace   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% with global hypokinesis of the left ventricle and grade 1 diastolic dysfunction 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  Consider lasix  soon pending renal function.  Add TED hose   Anxiety On opiates for pain management - History of substance abuse and chronic anxiety P:  - cont klonipin 2mg  TID,  robaxin  750mg  q8hrs, gabapentin  100mg  TID, lidoderm  patch, oxy 10mg  q6hrs and prn dilaudid  with scheduled bowel regimen   Nonoliguric AKI Urinary retention  Hyperkalemia, improving  - Nephrology s/o 11/21 - s/p foley placement by urology 11/16 given pendulous and bulbar urethral injuries / false pass  P: - pending BMET - remove foley today per urology notes, frequent bladder scan - repeat renal indices in am - strict I/Os, daily wts - avoid nephrotoxins, renal dose meds, hemodynamic support as above   Chronic anemia - trend CBC periodically - transfuse per protocol    Protein calorie malnutrition -  cont EN per RD via cortrak, MVI and FWF   Stage II pressure ulcer sacrum, present on admission - cont wound care - maximize nutrition, frequent turns  Heparin  for VTE ppx PPI not warranted   CRITICAL CARE Performed by: Lyle Pesa   Total critical care time: 32 minutes  Critical care time was exclusive of separately billable procedures and treating other patients.  Critical care was necessary to treat or prevent imminent or life-threatening deterioration.  Critical care was time spent personally by me on the following activities: development of treatment plan with patient and/or surrogate as well as nursing, discussions with consultants, evaluation of patient's response to treatment, examination of patient, obtaining history from patient or surrogate, ordering and performing treatments and interventions, ordering and review of  laboratory studies, ordering and review of radiographic studies, pulse oximetry and re-evaluation of patient's condition.    Lyle Pesa, NP [facility] 05/08/2024, 7:23 AM  See Amion for pager If no response to pager , please call [phone] until 7pm After 7:00 pm call Elink  [phone]

## 2024-05-08 NOTE — Progress Notes (Signed)
 Pt placed back on vent due to anxiety and increased WOB. Pt tolerating well at this time, RN aware, CCM aware.      05/08/24 9061  Therapy Vitals  Pulse Rate 96  Resp (!) 21  MEWS Score/Color  MEWS Score 1  MEWS Score Color Green  Respiratory Assessment  Assessment Type Assess only  Respiratory Pattern Regular;Unlabored  Chest Assessment Chest expansion symmetrical  Bilateral Breath Sounds Diminished;Rhonchi  Oxygen Therapy/Pulse Ox  O2 Device (S)  Ventilator  SpO2 92 %  O2 Therapy Oxygen  FiO2 (%) 30 %  Tracheostomy Shiley Flexible 6 mm Cuffed  Placement Date/Time: 05/06/24 1125   Serial / Lot #: [id number]  Expiration Date: 05/02/28  Placed By: (c) Other (Comment)  Brand: Shiley Flexible  Size (mm): 6 mm  Style: Cuffed  Status Secured with trach ties  Site Assessment Clean;Dry  Ties Assessment Clean, Dry  Cuff Pressure (cm H2O) Clear OR 27-39 CmH2O  Tracheostomy Equipment at bedside Yes and checklist posted at head of bed

## 2024-05-08 NOTE — Progress Notes (Signed)
 Orthopedic Tech Progress Note Patient Details:  [name] [name] Mar 01, 1976 [id number]  Ortho Devices Type of Ortho Device: Lumbar corsett Ortho Device/Splint Location: Left at bedside. Pt. will need to be fitted once he is up doing PT. Ortho Device/Splint Interventions: Ordered   Post Interventions Patient Tolerated: Well  [name] [name] 05/08/2024, 8:28 AM

## 2024-05-08 NOTE — Progress Notes (Addendum)
 Tolerated ATC for 30-66mins prior to pt asking RT to go back on MV.    BMET reviewed> Na 133, K 6, bicarb 20, BUN/ sCr 108/ 2.83> 110/ 2.77  MRI cspine, lumbar, and thoracic pending per ID recommendations  P: - hold FWF, adding enteral bicarb and repeat lokelma  x 2 with repeat BMET at 2000 - considering albumin / lasix  however BUN elevated and sCr has been improving with increasing UOP - given anxiety/ claustrophobia will restart precedex  for MRI    Addendum 1218> despite precedex  1, versed  4, and dilaudid  1, pt still not able to tolerate MRI with moving and pulling at pulse ox and monitor due to anxiety/ claustrophobia.  Remains on full MV support w/ vitals stable.  Will switch precedex  over to propofol  for MRI     Add CCT   Lyle Pesa, NP [facility] 05/08/2024, 10:53 AM  See Amion for pager If no response to pager , please call [phone] until 7pm After 7:00 pm call Elink  [phone]

## 2024-05-08 NOTE — Progress Notes (Signed)
 Ventilator patient transported from 3M14 to MRI and back without any issues.

## 2024-05-08 NOTE — Progress Notes (Signed)
 Pt placed on ATC 10L 30%, Pt tolerating well at this time. No increased WOB noted, Spo2 97%, vitals stable, RN aware, CCM aware.     05/08/24 0858  Therapy Vitals  Pulse Rate (!) 103  Resp 14  MEWS Score/Color  MEWS Score 1  MEWS Score Color Green  Respiratory Assessment  Assessment Type Assess only  Respiratory Pattern Regular;Unlabored  Chest Assessment Chest expansion symmetrical  Cough Productive  Sputum Amount Moderate  Sputum Color White;Clear  Sputum Consistency Thick  Sputum Specimen Source Tracheostomy tube  Bilateral Breath Sounds Diminished;Rhonchi  Oxygen Therapy/Pulse Ox  O2 Device (S)  Tracheostomy Collar  SpO2 98 %  O2 Therapy Oxygen humidified  O2 Flow Rate (L/min) 10 L/min  FiO2 (%) 30 %  Tracheostomy Shiley Flexible 6 mm Cuffed  Placement Date/Time: 05/06/24 1125   Serial / Lot #: [id number]  Expiration Date: 05/02/28  Placed By: (c) Other (Comment)  Brand: Shiley Flexible  Size (mm): 6 mm  Style: Cuffed  Status Secured with trach ties  Site Assessment Clean;Dry  Site Care Cleansed;Dried;Dressing applied  Inner Cannula Care (S)  Changed/new  Ties Assessment Clean, Dry  Cuff Pressure (cm H2O) Clear OR 27-39 CmH2O  Tracheostomy Equipment at bedside Yes and checklist posted at head of bed

## 2024-05-08 NOTE — Progress Notes (Signed)
 [facility]  Date of Admission:  04/14/2024     Reason for Follow Up: Sepsis (HCC)  Abx: 11/3-11/12; 11/18-c cefazlin  11/12-11/17 piptazo   Assessment:       Disseminated mssa infection (bsi, psoas abscesses, vertebral om neck/lumbar, septic pulm emboli, tv endocarditis) Resp failure  Aki     Hiv ab negative Hep c ab positive; hcv pcr negative;    04/14/24 bcx positive 11/4 bcx negative   11/3 tte tv vegetation   11/11 mri spine; atlantodental/basion-dens involvement, t5-6 om; l3-s1 om; l5s1 paraspinal soft tissue abscess left paraspinal psoas abscess     05/03/24 assessment 11/20 repeat bcx negative Stable vent minimal requirement Suspect high burden septic pulm emboli source for fever If no hemodynamic disturbance and stable vent setting will keep current abx   11/20 abd pelv chest Ct reviewed -- no occult abscesses   Patient also has bronch 11/20 -- cx yeast a colonizer will not need treatment  ------------ 04/28/24 id assessment Patient currently full code He has improved with mentation  He complained of back pain and known diffuse vertebral OM  Will get repeat mri to see if any abscess developed given mri done rather early     -continue cefazolin  -mri entire spine -continue standard isolation precaution  Principal Problem:   Sepsis (HCC) Active Problems:   MSSA bacteremia   Acute respiratory failure with hypoxia and hypercapnia (HCC)   Vertebral osteomyelitis (HCC)   Severe sepsis (HCC)   Psoas abscess (HCC)   Endocarditis of tricuspid valve   Acute septic pulmonary embolism without acute cor pulmonale (HCC)    Chlorhexidine  Gluconate Cloth  6 each Topical Daily   clonazePAM   2 mg Per Tube BID   feeding supplement (PROSource TF20)  60 mL Per Tube Daily   folic acid   1 mg Per Tube Daily   gabapentin   100 mg Per Tube Q8H   guaiFENesin   5 mL Per Tube Q4H   heparin  injection (subcutaneous)  5,000 Units  Subcutaneous Q8H   lidocaine   1 patch Transdermal Q24H   methocarbamol   750 mg Per Tube Q8H   midodrine   5 mg Per Tube TID WC   multivitamin with minerals  1 tablet Per Tube Daily   mouth rinse  15 mL Mouth Rinse Q2H   oxyCODONE   10 mg Per Tube Q6H   polyethylene glycol  17 g Per Tube Daily   sennosides  5 mL Oral BID   sodium bicarbonate   650 mg Per Tube BID    SUBJECTIVE: No complaint    No Known Allergies   Review of Systems: Review of Systems  Unable to perform ROS: Intubated      OBJECTIVE: Vitals:   05/08/24 1330 05/08/24 1400 05/08/24 1500 05/08/24 1538  BP: 106/80 113/87 109/85   Pulse: 62 87 82   Resp: 15 17 (!) 8   Temp:    98.2 F (36.8 C)  TempSrc:    Oral  SpO2: 98% 100% 100%   Weight:      Height:       Body mass index is 22.51 kg/m.  Physical Exam Constitutional:      General: He is not in acute distress.    Appearance: He is well-developed. He is ill-appearing.     Interventions: He is intubated. Cervical collar in place.  Cardiovascular:     Rate and Rhythm: Normal rate and regular rhythm.     Heart sounds: Normal heart sounds.  Pulmonary:     Effort: Pulmonary effort is normal. He is intubated.     Breath sounds: Normal breath sounds.  Skin:    General: Skin is warm and dry.  Neurological:     Mental Status: He is oriented to person, place, and time. He is lethargic.     Lab Results Lab Results  Component Value Date   WBC 11.5 (H) 05/07/2024   HGB 8.3 (L) 05/07/2024   HCT 27.0 (L) 05/07/2024   MCV 92.5 05/07/2024   PLT 175 05/07/2024    Lab Results  Component Value Date   CREATININE 2.77 (H) 05/08/2024   BUN 110 (H) 05/08/2024   NA 133 (L) 05/08/2024   K 6.0 (H) 05/08/2024   CL 96 (L) 05/08/2024   CO2 20 (L) 05/08/2024    Lab Results  Component Value Date   ALT <5 05/04/2024   AST 11 (L) 05/04/2024   ALKPHOS 83 05/04/2024   BILITOT 0.7 05/04/2024     Microbiology: Recent Results (from the past 240 hours)   Culture, BAL-quantitative w Gram Stain     Status: Abnormal   Collection Time: 05/02/24 11:06 AM   Specimen: Bronchoalveolar Lavage; Respiratory  Result Value Ref Range Status   Specimen Description BRONCHIAL ALVEOLAR LAVAGE  Final   Special Requests Normal  Final   Gram Stain   Final    WBC PRESENT, PREDOMINANTLY PMN YEAST WITH PSEUDOHYPHAE CRITICAL RESULT CALLED TO, READ BACK BY AND VERIFIED WITH: RN JESSICA D ON 05/02/24 @ 2032 BY DRT    Culture (A)  Final    10,000 COLONIES/mL Consistent with normal respiratory flora. Performed at [facility], [address], [place], [place] [address]    Report Status 05/05/2024 FINAL  Final  Culture, blood (Routine X 2) w Reflex to ID Panel     Status: None   Collection Time: 05/02/24  7:52 PM   Specimen: BLOOD  Result Value Ref Range Status   Specimen Description BLOOD SITE NOT SPECIFIED  Final   Special Requests   Final    BOTTLES DRAWN AEROBIC ONLY Blood Culture results may not be optimal due to an inadequate volume of blood received in culture bottles   Culture   Final    NO GROWTH 5 DAYS Performed at [facility], [address], [place], [place] [address]    Report Status 05/07/2024 FINAL  Final  Culture, blood (Routine X 2) w Reflex to ID Panel     Status: None   Collection Time: 05/02/24  7:57 PM   Specimen: BLOOD  Result Value Ref Range Status   Specimen Description BLOOD SITE NOT SPECIFIED  Final   Special Requests   Final    BOTTLES DRAWN AEROBIC AND ANAEROBIC Blood Culture results may not be optimal due to an inadequate volume of blood received in culture bottles   Culture   Final    NO GROWTH 5 DAYS Performed at [facility], [address], [place], [place] [address]    Report Status 05/07/2024 FINAL  Final           Constance ONEIDA Passer, MD [facility] for Infectious Disease [facility] [phone]  pager   [phone] cell 05/08/2024, 4:27 PM

## 2024-05-09 ENCOUNTER — Inpatient Hospital Stay (HOSPITAL_COMMUNITY): Payer: MEDICAID

## 2024-05-09 LAB — BASIC METABOLIC PANEL WITH GFR
Anion gap: 18 — ABNORMAL HIGH (ref 5–15)
Anion gap: 12 (ref 5–15)
BUN: 106 mg/dL — ABNORMAL HIGH (ref 6–20)
BUN: 106 mg/dL — ABNORMAL HIGH (ref 6–20)
CO2: 18 mmol/L — ABNORMAL LOW (ref 22–32)
CO2: 26 mmol/L (ref 22–32)
Calcium: 8 mg/dL — ABNORMAL LOW (ref 8.9–10.3)
Calcium: 8.3 mg/dL — ABNORMAL LOW (ref 8.9–10.3)
Chloride: 98 mmol/L (ref 98–111)
Chloride: 97 mmol/L — ABNORMAL LOW (ref 98–111)
Creatinine, Ser: 2.71 mg/dL — ABNORMAL HIGH (ref 0.61–1.24)
Creatinine, Ser: 2.51 mg/dL — ABNORMAL HIGH (ref 0.61–1.24)
GFR, Estimated: 28 mL/min — ABNORMAL LOW (ref >60)
GFR, Estimated: 31 mL/min — ABNORMAL LOW (ref >60)
Glucose, Bld: 110 mg/dL — ABNORMAL HIGH (ref 70–99)
Glucose, Bld: 100 mg/dL — ABNORMAL HIGH (ref 70–99)
Potassium: 5.7 mmol/L — ABNORMAL HIGH (ref 3.5–5.1)
Potassium: 4.3 mmol/L (ref 3.5–5.1)
Sodium: 134 mmol/L — ABNORMAL LOW (ref 135–145)
Sodium: 135 mmol/L (ref 135–145)

## 2024-05-09 LAB — GLUCOSE, CAPILLARY
Glucose-Capillary: 100 mg/dL — ABNORMAL HIGH (ref 70–99)
Glucose-Capillary: 79 mg/dL (ref 70–99)
Glucose-Capillary: 96 mg/dL (ref 70–99)
Glucose-Capillary: 97 mg/dL (ref 70–99)
Glucose-Capillary: 97 mg/dL (ref 70–99)
Glucose-Capillary: 97 mg/dL (ref 70–99)

## 2024-05-09 LAB — CBC
HCT: 30.2 % — ABNORMAL LOW (ref 39.0–52.0)
Hemoglobin: 9.3 g/dL — ABNORMAL LOW (ref 13.0–17.0)
MCH: 29.1 pg (ref 26.0–34.0)
MCHC: 30.8 g/dL (ref 30.0–36.0)
MCV: 94.4 fL (ref 80.0–100.0)
Platelets: 169 K/uL (ref 150–400)
RBC: 3.2 MIL/uL — ABNORMAL LOW (ref 4.22–5.81)
RDW: 18.1 % — ABNORMAL HIGH (ref 11.5–15.5)
WBC: 10.4 K/uL (ref 4.0–10.5)
nRBC: 0 % (ref 0.0–0.2)

## 2024-05-09 LAB — PHOSPHORUS: Phosphorus: 6.3 mg/dL — ABNORMAL HIGH (ref 2.5–4.6)

## 2024-05-09 LAB — MAGNESIUM: Magnesium: 2.1 mg/dL (ref 1.7–2.4)

## 2024-05-09 MED ORDER — METHADONE HCL 5 MG PO TABS: 5.0000 mg | ORAL_TABLET | Freq: Three times a day (TID) | ORAL | Status: DC

## 2024-05-09 MED ORDER — ALPRAZOLAM 0.5 MG PO TABS
1.0000 mg | ORAL_TABLET | Freq: Four times a day (QID) | ORAL | Status: DC
  Administered 2024-05-09 – 2024-05-11 (×8): 1 mg via ORAL
  Filled 2024-05-09 (×8): qty 2

## 2024-05-09 MED ORDER — KETAMINE HCL 50 MG/5ML IJ SOSY
100.0000 mg | PREFILLED_SYRINGE | Freq: Once | INTRAMUSCULAR | Status: AC
  Administered 2024-05-09: 100 mg via INTRAVENOUS
  Filled 2024-05-09: qty 10

## 2024-05-09 MED ORDER — ROCURONIUM BROMIDE 10 MG/ML (PF) SYRINGE
100.0000 mg | PREFILLED_SYRINGE | Freq: Once | INTRAVENOUS | Status: AC
  Administered 2024-05-09: 100 mg via INTRAVENOUS
  Filled 2024-05-09: qty 10

## 2024-05-09 MED ORDER — SODIUM CHLORIDE 0.9 % IV SOLN
250.0000 mL | INTRAVENOUS | Status: AC
  Administered 2024-05-09: 250 mL via INTRAVENOUS

## 2024-05-09 MED ORDER — MIDAZOLAM HCL (PF) 2 MG/2ML IJ SOLN: 4.0000 mg | INTRAMUSCULAR | Status: DC | PRN

## 2024-05-09 MED ORDER — SODIUM ZIRCONIUM CYCLOSILICATE 10 G PO PACK
10.0000 g | PACK | Freq: Every day | ORAL | Status: DC
  Administered 2024-05-09 – 2024-05-15 (×7): 10 g
  Filled 2024-05-09 (×7): qty 1

## 2024-05-09 MED ORDER — HYDROMORPHONE HCL-NACL 50-0.9 MG/50ML-% IV SOLN
1.0000 mg/h | INTRAVENOUS | Status: DC
  Administered 2024-05-09 (×2): 1 mg/h via INTRAVENOUS
  Filled 2024-05-09 (×2): qty 50

## 2024-05-09 MED ORDER — NOREPINEPHRINE 4 MG/250ML-% IV SOLN
0.0000 ug/min | INTRAVENOUS | Status: DC
  Administered 2024-05-09: 2 ug/min via INTRAVENOUS
  Filled 2024-05-09: qty 250

## 2024-05-09 MED ORDER — METHADONE HCL 10 MG PO TABS
5.0000 mg | ORAL_TABLET | Freq: Three times a day (TID) | ORAL | Status: DC
  Administered 2024-05-09 – 2024-05-16 (×21): 5 mg
  Filled 2024-05-09 (×21): qty 1

## 2024-05-09 MED ORDER — HYDROMORPHONE HCL 1 MG/ML IJ SOLN
2.0000 mg | INTRAMUSCULAR | Status: DC | PRN
  Administered 2024-05-09 (×2): 2 mg via INTRAVENOUS
  Filled 2024-05-09: qty 2

## 2024-05-09 MED ORDER — SODIUM ZIRCONIUM CYCLOSILICATE 10 G PO PACK
10.0000 g | PACK | ORAL | Status: AC
  Administered 2024-05-09 (×2): 10 g via ORAL
  Filled 2024-05-09 (×2): qty 1

## 2024-05-09 MED ORDER — NOREPINEPHRINE 4 MG/250ML-% IV SOLN
0.0000 ug/min | INTRAVENOUS | Status: DC
Start: 2024-05-09 — End: 2024-05-09

## 2024-05-09 NOTE — Procedures (Signed)
 Urology Procedure Note   HPI: I was notified by primary RN that pt's foley was removed at 1800 and he had not voided since. Coude team had attempted placement, but had encountered resistance at the level of the prostate. Patient had previously required catheter placement via bedside cystoscope with Dr. Alvaro on 04/28/24.  Procedure: Consent was obtained. The patient was prepped and draped in the usual sterile fashion. I initially attempted placement of a glidewire, but met resistance at the prostate. A 17 Fr ambu cystoscope was passed per urethra. There was a false passage directly left of the true lumen in the bulbar urethra. The scope was passed easily into bladder. The remainder of the urethra and bladder appeared normal. A glidewire was passed through the scope and the scope was removed. A 16 Fr council tip catheter was then passed over the wire and the wire removed. There was return of clear yellow urine and the balloon was instilled with 10 cc sterile water . The catheter was put to drainage.   Plan:  - Continue foley for 7-10 days - Please check with Urology prior to catheter removal (DO NOT recommend PM removal)  Maurilio Agar, MD

## 2024-05-09 NOTE — Progress Notes (Signed)
 Dr. Maurilio Agar (Urology) at bedside to place coude catheter. Urology cart bedside. RN assisted.

## 2024-05-09 NOTE — Progress Notes (Addendum)
 eLink Physician-Brief Progress Note Patient Name: [name] [name] DOB: May 11, 1976 MRN: [id number]   Date of Service  05/09/2024  HPI/Events of Note  K was 5.7 earlier and given Lokelma . No repeat check ordered.   eICU Interventions  Get BMP and let us  know results     Intervention Category Intermediate Interventions: Electrolyte abnormality - evaluation and management  Eimy Plaza G Jaloni Davoli 05/09/2024, 7:46 PM  9 pm - K is 4.3. Recheck in AM

## 2024-05-09 NOTE — Progress Notes (Signed)
 Placed back on full support at this time per RN request due to starting pt on stronger meds.

## 2024-05-09 NOTE — Progress Notes (Signed)
 Pt transported from 36M to MRI and then back to 36M

## 2024-05-09 NOTE — Progress Notes (Signed)
    MRI sedation plan  Plan  - rest back on vent - dilaudid  gtt + prn  -if RASS -4 with aboe> then give roc pre MRI - If RASS till not deep enough (ie +1 to -3) then give ketamine  single dose just prior to MRI - d/w nursing     SIGNATURE    Dr. Dorethia Cave, M.D., F.C.C.P,  Pulmonary and Critical Care Medicine Staff Physician, [facility] Director - Interstitial Lung Disease  Program  Pulmonary Fibrosis [facility] at [facility] [place], [place], [address]   Pager: [phone], If no answer  -> Check AMION or Try [phone] Telephone (clinical office): [phone] Telephone (research): [phone]  12:04 PM 05/09/2024

## 2024-05-09 NOTE — Progress Notes (Addendum)
 NAME:  [name] [name], MRN:  [id number], DOB:  [birth year], LOS: 25 ADMISSION DATE:  04/14/2024, CONSULTATION DATE:  04/14/2024 REFERRING MD:  [place] - [facility] EDP, CHIEF COMPLAINT:  AMS  History of Present Illness:  48 year old man who presented to [facility] ED 11/2 with AMS. PMHx significant for MSSA bacteremia, MSSA PNA, infective endocarditis, cervical through lumbar osteomyelitis, psoas abscess, severe protein calorie malnutrition/FTT, substance abuse. Recent admission 10/2 - 10/13 for sepsis, widespread MSSA.  Arrived to [facility] ED covered in feces, altered and with poor airway protection; he was subsequently intubated. CXR demonstrated RLL opacities. Confirmed bilateral septic pulmonary emboli, tricuspid valve endocarditis.  Required intubation and mechanical ventilation. Clinically progressed and was transferred out of ICU 11/10.  Reconsulted 11/13 for AMS, tachypnea and tachycardia.   Pertinent Medical History:  MSSA bacteremia MSSA PNA Lumbar osteomyelitis  Psoas abscess  Severe protein calorie malnutrition FTT in adult  Significant Hospital Events: Including procedures, antibiotic start and stop dates in addition to other pertinent events   11/2 [facility] ED w AMS. Intubated. Started on vanc mero. Txf request to [place] to [facility]  11/3 Extubated, developed respiratory distress requiring reintubation. Brief cardiac arrest during intubation.  11/3 Transthoracic echocardiogram > LVEF 40-45%, global hypokinesis, grade 1 diastolic dysfunction, normal RV function.  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  No mitral or aortic valve involvement 11/3 CT Chest, abdomen, pelvis >> numerous bilateral cavitary and solid nodules consistent with septic emboli, left lower lobe consolidation with a small pleural effusion, right basilar atelectasis.  Moderate free fluid in the pelvis.  Erosive changes at T5-T6, L2-L3, L4-L5 suggestive of discitis 11/3 CT Head >> no acute abnormality.   Opacified left maxillary sinus 11/10 Transferred out of ICU. 11/13 PCCM reconsulted for decreased mental status, tachycardia, tachypnea. Started diuretics 11/16 Poorly responsive, transferred back to ICU and intubated for the third time. Pressors started post intubation. Renal function worse. UOP poor.  Trach aspirate - Candida 11/20 NO acute events overnight, awake and following commands.  Patient and spouse met with palliative care and collectively decision was made to proceed with trach and PEG if needed Blood culture - NEG BAL -0 normal flora 11/21 no acute events overnight, hemoglobin remained stable 11/23 pain, discomfort, muscle spasms 11/24 CCS trach, cortrak, off precedex  11/25 PSV 5/5 for 8hrs 11/26 - In better place mentally, motivated to continue to get better.  Thankful for care being provided. C/o of ongoing neck and back pain. Asking not to tell him when on PSV so he doesn't get more anxious Did ATC briefly Went for MRI but too much motion artifcat despite precedex  and intermitten fent/versed   Interim History/Subjective:    11/27 - needs MRI again but concerns for anxiety and motion again. LE weaker than UE. Now on  ATC Trach 40%. Afebrilre. Vent overnight. C collar remains. RN sas stage 2 decub slowly progressing to stage 3.   [place] -> says klonopin  wont help.Recommends Xanax  1mg  qid  Objective:   Blood pressure (!) 127/93, pulse (!) 102, temperature 99.5 F (37.5 C), temperature source Oral, resp. rate 19, height 6' (1.829 m), weight 75.3 kg, SpO2 100%.    Vent Mode: PRVC FiO2 (%):  [30 %-40 %] 40 % Set Rate:  [18 bmp] 18 bmp Vt Set:  [470 mL] 470 mL PEEP:  [5 cmH20] 5 cmH20 Plateau Pressure:  [11 cmH20-17 cmH20] 14 cmH20   Intake/Output Summary (Last 24 hours) at 05/09/2024 9188 Last data filed  at 05/09/2024 0800 Gross per 24 hour  Intake 1751 ml  Output 2200 ml  Net -449 ml   Filed Weights   05/07/24 0401 05/08/24 0144 05/09/24 0139  Weight: 75.3  kg 75.3 kg 75.3 kg   Physical Examination:  General Appearance:  Looks chronicaly ill but stable Head:  Normocephalic, without obvious abnormality, atraumatic Eyes:  PERRL - yes, conjunctiva/corneas - muddy     Ears:  Normal external ear canals, both ears Nose:  G tube - YES Throat:  ETT TUBE +  Neck:  Supple,  No enlargement/tenderness/nodules. HARD COLLAR + Lungs: Clear to auscultation bilaterally, On ATC. COUGHS with secretions + Heart:  S1 and S2 normal, no murmur, CVP - no.  Pressors - no Abdomen:  Soft, no masses, no organomegaly Genitalia / Rectal:  Not done Extremities:  Extremities- intact . UE > LE movement Skin:  ntact in exposed areas . Sacral area - DECUBB + Neurologic:  Sedation - oxycodone , dilaudid , versed  prn, klonopin , robaxin  -> RASS - +1 to +3 anxiety . Moves all 4s - yes but UE. CAM-ICU - neg . 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 ID wants MRI spine    P:   - Appreciate ID recs  - 6-8 weeks of cefazolin  (11/2> end date 12/16)   C Spine Osteop - last seen by NSGY 04/24/24  Plan - get MRI spine repeat (needs deep sedation plan) - c-collar, and LSO brace for comfort when upright> will reach out to ortho tech to follow up on > hopefully get OOB with PT once we have  - will need 6 week f/u with NSGY around week of 12/22 - , recs for medical management, not a surgical candidate at that  - PMT following peripherally, appreciate assistance, remains full code   Acute hypoxic and hypercapnic respiratory failure secondary to MSSA pneumonia, ARDS, mucous plugging Septic pulmonary emboli Generalized  deconditioning Tracheostomy placement 11/24  11/28 - able to ATC in day with less anxiety  P:  - ATC/SBT in day  BUT REst at night - cont full LTVV support, 4-8cc/kg IBW with goal Pplat <30 and DP<15  - VAP prevention protocol/ PPI - PAD protocol for sedation> minimize now with trach, see below- stable CXR 11/24, CXR prn - wean FiO2 as able for SpO2 >92%  - aggressive pulmonary hygiene - PRN BD - SLP eventual when tolerating ATC, cont cortrak for now, NPO - OOB when have brace   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% with global hypokinesis of the left ventricle and grade 1 diastolic dysfunction 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  Consider lasix  soon pending renal function.  Add TED hose   Anxiety - on home xanax  On opiates for pain management - History of substance abuse and chronic anxiety  P:  - continue robaxin  750mg  q8hrs, gabapentin  100mg  TID, lidoderm  patch, oxy 10mg  q6hrs and prn dilaudid  with scheduled bowel regimen - STOP Klonopin  and instead xanax  1mg  4x/days - Start Methadone  for opioid rotation (check QTc every 5-7 days, increase every 5-7 days based on  pain)  - wife  updaed and is in alignment   Nonoliguric AKI Urinary retention  Hyperkalemia,  - Nephrology s/o 11/21 - s/p foley placement by urology 11/16 given pendulous and bulbar urethral injuries / false pass -> antother Foley 05/10/24  11/28 - new foley overnight and having some hematuria., K still high L ast lokelma  11/26  P: - monitor - lokelma   Chronic anemia - trend CBC periodically - transfuse per protocol    Protein calorie malnutrition -  cont EN per RD via cortrak, MVI and FWF   Stage II pressure ulcer sacrum, present on admission - cont wound care - maximize nutrition, frequent turns  Deconditioning  Heparin  for VTE ppx PPI not warranted   Fmaily  Patient updated 05/09/24 -> later called  common law wife Warren Benders [phone]. ).     ATTESTATION & SIGNATURE   The patient [name] [name] is critically ill with multiple organ systems failure and requires high complexity decision making for assessment and support, frequent evaluation and titration of therapies, application of advanced monitoring technologies and extensive interpretation of multiple databases and discussion with other appropriate health care personnel such as bedside nurses, social workers, case production designer, theatre/television/film, consultants, respiratory therapists, nutritionists, secretaries etc.,  Critical care time includes but is not restricted to just documentation time. Documentation can happen in parallel or sequential to care time depending on case mix urgency and priorities for the shift. So, overall critical Care Time devoted to patient care services described in this note is  30  Minutes.   This time reflects time of care of this signee Dr Dorethia Cave which includ does not reflect procedure time, or teaching time or supervisory time of PA/NP/Med student/Med Resident etc but could involve care discussion time     Dr. Dorethia Cave, M.D., [facility].C.P Pulmonary and Critical Care Medicine Staff Physician, [facility] [place] Pulmonary and Critical Care Pager: [phone], If no answer or between  15:00h - 7:00h: call [phone]  05/09/2024 8:16 AM    LABS    PULMONARY No results for input(s): PHART, PCO2ART, PO2ART, HCO3, TCO2, O2SAT in the last 168 hours.  Invalid input(s): PCO2, PO2  CBC Recent Labs  Lab 05/04/24 0953 05/06/24 1040 05/07/24 1004  HGB 7.9* 9.3* 8.3*  HCT 25.3* 29.1* 27.0*  WBC 6.5 9.5 11.5*  PLT 125* 172 175    COAGULATION Recent Labs  Lab 05/03/24 0359  INR 1.3*    CARDIAC  No results for input(s): TROPONINI in the last 168 hours. No results for input(s): PROBNP in the last 168 hours.   CHEMISTRY Recent Labs  Lab 05/03/24 0359  05/04/24 0737 05/06/24 1040 05/07/24 1004 05/08/24 0841 05/08/24 1915 05/08/24 2140 05/09/24 0619  NA 137   < > 135 134* 133* 132*  --  134*  K 4.7   < > 5.7* 5.2* 6.0* 6.2* 5.4* 5.7*  CL 100   < > 100 100 96* 98  --  98  CO2 21*   < > 21* 22 20* 19*  --  18*  GLUCOSE 119*   < > 106* 103* 102* 96  --  110*  BUN 99*   < > 110* 108* 110* 110*  --  106*  CREATININE 2.96*   < > 2.87* 2.83* 2.77* 2.78*  --  2.71*  CALCIUM  8.0*   < > 8.3* 8.1* 8.4* 8.1*  --  8.0*  MG 2.0  --   --   --  2.1  --   --  2.1  PHOS 7.4*  --   --   --  6.6*  --   --  6.3*   < > = values in this interval not displayed.   Estimated Creatinine Clearance: 36.3 mL/min (A) (by C-G formula based on SCr of 2.71 mg/dL (H)).   LIVER Recent Labs  Lab 05/03/24 0359 05/04/24 0953  AST  --  11*  ALT  --  <5  ALKPHOS  --  83  BILITOT  --  0.7  PROT  --  6.5  ALBUMIN   --  1.7*  INR 1.3*  --      INFECTIOUS No results for input(s): LATICACIDVEN, PROCALCITON in the last 168 hours.   ENDOCRINE CBG (last 3)  Recent Labs    05/08/24 2307 05/09/24 0327 05/09/24 0719  GLUCAP 80 97 97         IMAGING x48h  - image(s) personally visualized  -   highlighted in bold MR CERVICAL SPINE WO CONTRAST Result Date: 05/08/2024 CLINICAL DATA:  Follow-up examination for known bacteremia with spinal infection, evaluate for possible epidural abscess. EXAM: MRI CERVICAL SPINE WITHOUT CONTRAST TECHNIQUE: Multiplanar, multisequence MR imaging of the cervical spine was performed. No intravenous contrast was administered. COMPARISON:  Comparison made with prior MRI from 04/23/2024. FINDINGS: Alignment: Examination severely degraded by motion artifact, markedly limiting assessment. Straightening of the normal cervical lordosis. No interval listhesis. Vertebrae: Persistent findings of on going infection at the skull base involving the C1-2 articulations again seen. Persistent abnormal widening of the landed dental interval up  to approximately 10 mm. Basion dens interval measures up to 16 mm. Secondary slight basilar invagination at the ventral aspect of the cervicomedullary junction. Overall, appearance and alignment is relatively stable from prior. Associated prevertebral edema within the upper cervical spinal cord. No other visible new or distant areas of progressive infection elsewhere within the cervical spine on this motion degraded exam. Vertebral body height maintained. Decreased T1 signal intensity noted throughout the visualized bone marrow, nonspecific, but most commonly related to anemia, smoking or obesity. Cord: Grossly normal signal and morphology. No visible epidural abscess or other epidural involvement of infection on this motion degraded exam. Posterior Fossa, vertebral arteries, paraspinal tissues: Visualized brain and posterior fossa are grossly within normal limits. No skull base infection about the C1-2 articulations as above. Associated prevertebral edema about the upper cervical spine. Neither vertebral artery flow void well seen on this motion degraded exam. Disc levels: Underlying mild for age cervical spondylosis, grossly similar to previous. No significant stenosis. No new or progressive finding on this motion degraded exam. IMPRESSION: 1. Severely motion degraded exam, markedly limiting assessment. 2. Persistent findings of ongoing infection at the skull base involving the atlantodental articulation. Persistent abnormal widening of the atlantodental interval up to 10 mm. Basion dens interval measures up to 16 mm. Secondary slight basilar invagination at the ventral aspect of the cervicomedullary junction. Overall, appearance and alignment is relatively stable from prior. 3. No visible epidural abscess or other epidural involvement of infection on this motion degraded exam. No other visible new or distant sites of infection. Electronically Signed   By: Morene Hoard M.D.   On: 05/08/2024 18:46

## 2024-05-10 LAB — CBC
HCT: 27.1 % — ABNORMAL LOW (ref 39.0–52.0)
Hemoglobin: 8.1 g/dL — ABNORMAL LOW (ref 13.0–17.0)
MCH: 29 pg (ref 26.0–34.0)
MCHC: 29.9 g/dL — ABNORMAL LOW (ref 30.0–36.0)
MCV: 97.1 fL (ref 80.0–100.0)
Platelets: 160 K/uL (ref 150–400)
RBC: 2.79 MIL/uL — ABNORMAL LOW (ref 4.22–5.81)
RDW: 18.3 % — ABNORMAL HIGH (ref 11.5–15.5)
WBC: 9.3 K/uL (ref 4.0–10.5)
nRBC: 0 % (ref 0.0–0.2)

## 2024-05-10 LAB — COMPREHENSIVE METABOLIC PANEL WITH GFR
ALT: <5 U/L (ref 0–44)
AST: 10 U/L — ABNORMAL LOW (ref 15–41)
Albumin: 1.5 g/dL — ABNORMAL LOW (ref 3.5–5.0)
Alkaline Phosphatase: 85 U/L (ref 38–126)
Anion gap: 10 (ref 5–15)
BUN: 105 mg/dL — ABNORMAL HIGH (ref 6–20)
CO2: 26 mmol/L (ref 22–32)
Calcium: 8.1 mg/dL — ABNORMAL LOW (ref 8.9–10.3)
Chloride: 101 mmol/L (ref 98–111)
Creatinine, Ser: 2.68 mg/dL — ABNORMAL HIGH (ref 0.61–1.24)
GFR, Estimated: 29 mL/min — ABNORMAL LOW (ref >60)
Glucose, Bld: 111 mg/dL — ABNORMAL HIGH (ref 70–99)
Potassium: 4.7 mmol/L (ref 3.5–5.1)
Sodium: 137 mmol/L (ref 135–145)
Total Bilirubin: 0.8 mg/dL (ref 0.0–1.2)
Total Protein: 6.1 g/dL — ABNORMAL LOW (ref 6.5–8.1)

## 2024-05-10 LAB — GLUCOSE, CAPILLARY
Glucose-Capillary: 79 mg/dL (ref 70–99)
Glucose-Capillary: 56 mg/dL — ABNORMAL LOW (ref 70–99)
Glucose-Capillary: 99 mg/dL (ref 70–99)
Glucose-Capillary: 78 mg/dL (ref 70–99)
Glucose-Capillary: 119 mg/dL — ABNORMAL HIGH (ref 70–99)
Glucose-Capillary: 105 mg/dL — ABNORMAL HIGH (ref 70–99)
Glucose-Capillary: 134 mg/dL — ABNORMAL HIGH (ref 70–99)

## 2024-05-10 LAB — MAGNESIUM: Magnesium: 2.1 mg/dL (ref 1.7–2.4)

## 2024-05-10 LAB — PHOSPHORUS: Phosphorus: 6 mg/dL — ABNORMAL HIGH (ref 2.5–4.6)

## 2024-05-10 MED ORDER — HYDROXYZINE HCL 25 MG PO TABS
25.0000 mg | ORAL_TABLET | Freq: Once | ORAL | Status: AC
  Administered 2024-05-10: 25 mg
  Filled 2024-05-10: qty 1

## 2024-05-10 MED ORDER — HYDROMORPHONE HCL 1 MG/ML IJ SOLN
1.0000 mg | INTRAMUSCULAR | Status: DC | PRN
  Administered 2024-05-10 – 2024-05-13 (×12): 1 mg via INTRAVENOUS
  Filled 2024-05-10 (×13): qty 1

## 2024-05-10 MED ORDER — METRONIDAZOLE 500 MG/100ML IV SOLN
500.0000 mg | Freq: Two times a day (BID) | INTRAVENOUS | Status: DC
  Administered 2024-05-10 – 2024-05-12 (×4): 500 mg via INTRAVENOUS
  Filled 2024-05-10 (×4): qty 100

## 2024-05-10 MED ORDER — SODIUM CHLORIDE 0.9 % IV SOLN
2.0000 g | INTRAVENOUS | Status: AC
  Administered 2024-05-10 – 2024-05-14 (×5): 2 g via INTRAVENOUS
  Filled 2024-05-10 (×5): qty 20

## 2024-05-10 MED ORDER — SODIUM BICARBONATE 650 MG PO TABS
325.0000 mg | ORAL_TABLET | Freq: Two times a day (BID) | ORAL | Status: DC
  Administered 2024-05-10 – 2024-05-14 (×8): 325 mg
  Filled 2024-05-10 (×8): qty 1

## 2024-05-10 MED ORDER — SENNOSIDES 8.8 MG/5ML PO SYRP
5.0000 mL | ORAL_SOLUTION | Freq: Two times a day (BID) | ORAL | Status: DC
  Administered 2024-05-10 – 2024-05-11 (×2): 5 mL
  Filled 2024-05-10 (×2): qty 5

## 2024-05-10 MED ORDER — DEXTROSE 50 % IV SOLN
25.0000 mL | Freq: Once | INTRAVENOUS | Status: AC
  Administered 2024-05-10: 25 mL via INTRAVENOUS
  Filled 2024-05-10: qty 50

## 2024-05-10 NOTE — TOC Progression Note (Addendum)
 Transition of Care [facility]) - Progression Note    Patient Details  Name: [name] [name] MRN: [id number] Date of Birth: 09-08-1975  Transition of Care [facility]) CM/SW Contact  Tom-Johnson, Yannely Kintzel Daphne, RN Phone Number: 05/10/2024, 2:06 PM  Clinical Narrative:     CM consulted for [facility], CM had [facility]. [facility] declined, [facility] considering d/t patient with no insurance. CM sent referral to Financial Navigator for Medicaid and Disability eligibility, awaiting response. CM reached out to DJ, Liaison at [facility] and he states he is awaiting response from Leadership regarding Letter Of Guarantee request.   CM will continue to follow as patient progresses with care towards discharge.                        Expected Discharge Plan and Services                                               Social Drivers of Health (SDOH) Interventions SDOH Screenings   Food Insecurity: No Food Insecurity (03/15/2024)  Housing: Low Risk  (03/15/2024)  Transportation Needs: No Transportation Needs (03/15/2024)  Utilities: Not At Risk (03/15/2024)  Tobacco Use: High Risk (05/06/2024)    Readmission Risk Interventions    04/15/2024    4:16 PM 03/25/2024    9:50 AM 03/22/2024   10:01 AM  Readmission Risk Prevention Plan  Transportation Screening Complete Complete Complete  PCP or Specialist Appt within 5-7 Days Complete    Home Care Screening Complete Complete Complete  Medication Review (RN CM) Referral to Pharmacy Complete Complete

## 2024-05-10 NOTE — Progress Notes (Addendum)
 Notified of copious milky secretions and desaturations during bath, since improved with suctioning and O2 breaths.  Remains on full MV.  Trach asp sent this morning given change in sputum color from prior days.    - will broaden to unasyn  for now, follow trach asp - CBC in am - trend WBC/ fever curve    Addendum> unasyn  will not cover for MSSA bacteremia/ IE, therefore change to ctx/ flagyl, discussed with pharmacy   Lyle Pesa, NP  Pulmonary & Critical Care 05/10/2024, 4:19 PM  See Amion for pager If no response to pager , please call [phone] until 7pm After 7:00 pm call Elink  [phone]

## 2024-05-10 NOTE — Progress Notes (Signed)
 RN called and stated that the patient was placed back on full support on ventilator due to apnea during SBT.

## 2024-05-10 NOTE — Progress Notes (Signed)
 Physical Therapy Treatment Patient Details Name: [name] [name] MRN: [id number] DOB: [birth year] Today's Date: 05/10/2024   History of Present Illness 48 yo M recently admitted with MSSA PNA MSSA bacteremia psoas abscesses and lumbar osteo 03/14/24-03/25/24 (left AMA) who presented to [facility] ED 04/14/24 w AMS. Admitted with sepsis and transferred to [facility]. Arrived to ED covered in feces, altered, with poor airway protection and was intubated, Had PEA cardiac arrest on 11/3 with 4 mins CPR extubated on 11/9. 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 PMH: includes: anxiety, HTN    PT Comments  Patient progressing still very slowly due to pain and today lethargic needing frequent cues/reminders to complete 5 reps of exercises on each leg.  Patient agreeable to try and roll though pain limited he did initiate with cues and A.  Goals updated this session.  Feel continued skilled PT indicated to progress mobility as able.  Remains appropriate for [facility] for continued therapies and medical intevention.     If plan is discharge home, recommend the following: Two people to help with walking and/or transfers;Two people to help with bathing/dressing/bathroom   Can travel by private vehicle     No  Equipment Recommendations  None recommended by PT    Recommendations for Other Services       Precautions / Restrictions Precautions Precautions: Fall;Cervical;Back Recall of Precautions/Restrictions: Impaired Precaution/Restrictions Comments: Cortrak, trach to vent Required Braces or Orthoses: Cervical Brace;Spinal Brace Cervical Brace: Hard collar ([place]) Spinal Brace: Lumbar corset (for OOB)     Mobility  Bed Mobility Overal bed mobility: Needs Assistance Bed Mobility: Rolling Rolling: Max assist, +2 for physical assistance, Used rails          General bed mobility comments: cues to use rails and A to turn hips to R though painful so limited    Transfers                   General transfer comment: deferred due to pain and likely not tolerated to chair per RN    Ambulation/Gait                   Stairs             Wheelchair Mobility     Tilt Bed    Modified Rankin (Stroke Patients Only)       Balance                                            Communication Communication Communication: Impaired Factors Affecting Communication: Trach/intubated  Cognition Arousal: Lethargic Behavior During Therapy: Flat affect   PT - Cognitive impairments: Difficult to assess Difficult to assess due to: Tracheostomy                     PT - Cognition Comments: has been able to write to communicate, though today hands too shaky Following commands: Impaired Following commands impaired: Follows one step commands inconsistently, Follows one step commands with increased time (multimodal cues)    Cueing Cueing Techniques: Verbal cues, Tactile cues  Exercises General Exercises - Lower Extremity Ankle Circles/Pumps: AROM, Both, 5 reps, Supine Short Arc Quad: AROM, Both, 5 reps, Supine Heel Slides: AAROM, Both, 5 reps, Supine Straight  Leg Raises: AAROM, Both, 5 reps, Supine    General Comments General comments (skin integrity, edema, etc.): on PRVC 30% FiO2 PEEP 5 via trach; VSS in supine      Pertinent Vitals/Pain Pain Assessment Faces Pain Scale: Hurts whole lot Pain Location: grimacing after exercises in bed and rolling Pain Descriptors / Indicators: Grimacing, Discomfort Pain Intervention(s): Monitored during session, Limited activity within patient's tolerance, Repositioned, Patient requesting pain meds-RN notified    Home Living                          Prior Function            PT Goals (current goals can now be found in the care plan  section) Acute Rehab PT Goals Patient Stated Goal: pt unable to state PT Goal Formulation: Patient unable to participate in goal setting Time For Goal Achievement: 05/24/24 Potential to Achieve Goals: Poor Progress towards PT goals: Progressing toward goals;Goals updated    Frequency    Min 2X/week      PT Plan      Co-evaluation              AM-PAC PT 6 Clicks Mobility   Outcome Measure  Help needed turning from your back to your side while in a flat bed without using bedrails?: Total Help needed moving from lying on your back to sitting on the side of a flat bed without using bedrails?: Total Help needed moving to and from a bed to a chair (including a wheelchair)?: Total Help needed standing up from a chair using your arms (e.g., wheelchair or bedside chair)?: Total Help needed to walk in hospital room?: Total Help needed climbing 3-5 steps with a railing? : Total 6 Click Score: 6    End of Session Equipment Utilized During Treatment: Oxygen Activity Tolerance: Patient limited by fatigue Patient left: in bed;with call bell/phone within reach   PT Visit Diagnosis: Muscle weakness (generalized) (M62.81);Other abnormalities of gait and mobility (R26.89);Pain Pain - part of body:  (back)     Time: 8651-8641 ([id number]) PT Time Calculation (min) (ACUTE ONLY): 10 min  Charges:    $Therapeutic Activity: 23-37 mins PT General Charges $$ ACUTE PT VISIT: 1 Visit                     Micheline [name], PT Acute Rehabilitation Services Office:[phone] 05/10/2024    [name] [name] 05/10/2024, 3:05 PM

## 2024-05-10 NOTE — Progress Notes (Signed)
 NAME:  [name] [name], MRN:  [id number], DOB:  21-Jul-1975, LOS: 26 ADMISSION DATE:  04/14/2024, CONSULTATION DATE:  04/14/2024 REFERRING MD:  [place] - [facility] EDP, CHIEF COMPLAINT:  AMS  History of Present Illness:  48 year old man who presented to [facility] ED 11/2 with AMS. PMHx significant for MSSA bacteremia, MSSA PNA, infective endocarditis, cervical through lumbar osteomyelitis, psoas abscess, severe protein calorie malnutrition/FTT, substance abuse. Recent admission 10/2 - 10/13 for sepsis, widespread MSSA.  Arrived to [facility] ED covered in feces, altered and with poor airway protection; he was subsequently intubated. CXR demonstrated RLL opacities. Confirmed bilateral septic pulmonary emboli, tricuspid valve endocarditis.  Required intubation and mechanical ventilation. Clinically progressed and was transferred out of ICU 11/10.  Reconsulted 11/13 for AMS, tachypnea and tachycardia.   Pertinent Medical History:  MSSA bacteremia MSSA PNA Lumbar osteomyelitis  Psoas abscess  Severe protein calorie malnutrition FTT in adult  Significant Hospital Events: Including procedures, antibiotic start and stop dates in addition to other pertinent events   11/2 [facility] ED w AMS. Intubated. Started on vanc mero. Txf request to [place] to [facility]  11/3 Extubated, developed respiratory distress requiring reintubation. Brief cardiac arrest during intubation.  11/3 Transthoracic echocardiogram > LVEF 40-45%, global hypokinesis, grade 1 diastolic dysfunction, normal RV function.  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  No mitral or aortic valve involvement 11/3 CT Chest, abdomen, pelvis >> numerous bilateral cavitary and solid nodules consistent with septic emboli, left lower lobe consolidation with a small pleural effusion, right basilar atelectasis.  Moderate free fluid in the pelvis.  Erosive changes at T5-T6, L2-L3, L4-L5 suggestive of discitis 11/3 CT Head >> no acute abnormality.   Opacified left maxillary sinus 11/10 Transferred out of ICU. 11/13 PCCM reconsulted for decreased mental status, tachycardia, tachypnea. Started diuretics 11/16 Poorly responsive, transferred back to ICU and intubated for the third time. Pressors started post intubation. Renal function worse. UOP poor.  Trach aspirate - Candida 11/20 NO acute events overnight, awake and following commands.  Patient and spouse met with palliative care and collectively decision was made to proceed with trach and PEG if needed Blood culture - NEG BAL -0 normal flora 11/21 no acute events overnight, hemoglobin remained stable 11/23 pain, discomfort, muscle spasms 11/24 CCS trach, cortrak, off precedex  11/25 PSV 5/5 for 8hrs 11/26 - In better place mentally, motivated to continue to get better.  Thankful for care being provided. C/o of ongoing neck and back pain. Asking not to tell him when on PSV so he doesn't get more anxious Did ATC briefly Went for MRI but too much motion artifcat despite precedex  and intermitten fent/versed  and propofol   11/27   needs MRI again but concerns for anxiety and motion again. LE weaker than UE. Now on  ATC Trach 40%. Afebrilre. Vent overnight. C collar remains. RN sas stage 2 decub slowly progressing to stage 3.  Family request change over from klonopin  to xanax , started on methadone    Interim History/Subjective:  Placed on dilaudid  gtt last evening for ongoing pain Currently rates 5/10  Objective:   Blood pressure 106/72, pulse 91, temperature 99.1 F (37.3 C), temperature source Oral, resp. rate 18, height 6' (1.829 m), weight 73.2 kg, SpO2 100%.    Vent Mode: PRVC FiO2 (%):  [30 %-40 %] 30 % Set Rate:  [18 bmp] 18 bmp Vt Set:  [470 mL] 470 mL PEEP:  [5 cmH20] 5 cmH20 Pressure Support:  [8 cmH20] 8 cmH20 Plateau  Pressure:  [8 cmH20-15 cmH20] 8 cmH20   Intake/Output Summary (Last 24 hours) at 05/10/2024 0930 Last data filed at 05/10/2024 0400 Gross per 24 hour  Intake  1296.44 ml  Output 1200 ml  Net 96.44 ml   Filed Weights   05/08/24 0144 05/09/24 0139 05/10/24 0500  Weight: 75.3 kg 75.3 kg 73.2 kg   Physical Examination: Dilaudid  1mg / hr General:  chronically ill appearing adult male lying in bed in NAD HEENT: MM pink/moist, poor dentition, ccollar in place Neuro: Awake, mouths words but falls asleep easily, 4/5 in UE, 2/5 in LE CV: rr, NSR PULM:  full MV support, coarse on right, clear left, yellowish trach secretions GI: soft, bs hypo, NT, foley- cyu Extremities: warm/dry, poor muscle mass, +1-2 ankle/pedal edema  Skin: no rashes   Afebrile Labs> K 4.7, bicarb 26, BUN/ sCr 106/ 2.71> 105/ 2.68, albumin  1.5, protein 6.1, WBC 9.3, H/H 8.1/ 27, plts 160  UOP 1.2L/ 24hrs Net +7.8L  MRI lumbar 11/27 1. Transitional lumbosacral anatomy with mostly lumbarized S1 level, same numbering used on prior Lumbar MRIs 2. Unresolved discitis/osteomyelitis at L3-L4, on the right at L5-S1, and ongoing associated right side L5-S1 septic facet joint. 3. Mild new abnormal signal at the anterior T12 inferior endplate. Difficult to exclude early New osteomyelitis at that level. Recommend attention on follow-up. 4. 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 Moderate to severe bilateral L3 and right L5 neural foraminal stenosis. 5. Extensive lumbar paraspinal edema and myositis. Mildly regressed left lower psoas muscle abscess (estimated 4 mL now) and minimally smaller right L5-S1 posterior facet joint abscess (estimated 2 mL)  MRI thoracic 11/27 1. Unresolved T5T6 discitis-osteomyelitis. But despite endplate erosion, no significant loss of vertebral body height, spinal abscess, or other complicating features at this time. 2. No new levels of thoracic spinal infection on this non-contrast exam. 3. Abnormal lungs, in part related to septic emboli. 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 MSSA septic pulmonary emboli C-spine septic arthritis  - - completed Zyvox  11/16  - - 11/20 Bcxs negative P:   - ID following, appreciate recs - 6-8 weeks of cefazolin  (11/2> end date 12/16) - MRI lumbar and thoracic as above> unresolved but stable thoracic imaging, lumbar imaging noted regressed psoas muscle and posterior facet joint abscesses but noted new abnormal signal at anterior T12 endplate, unable to exclude new osteomyelitis - remains afebrile and stable/ normal WBC   C Spine Osteop - last seen by NSGY 04/24/24 Plan - cont c-collar and LSO brace when upright for pain - will need 6 week f/u with NSGY around week of 12/22 - , recs for medical management, not a surgical candidate at that  - PMT following peripherally, appreciate assistance, remains full code   Acute hypoxic and hypercapnic respiratory failure secondary to MSSA pneumonia, ARDS, mucous plugging Septic pulmonary emboli Generalized deconditioning Tracheostomy placement 11/24 11/28 - able to ATC in day with less anxiety for 2hrs  P:  - eventual goal ATC> thus far not tolerated > 2hrs due to anxiety and yest requiring heavy sedation for MRI - minimize sedating meds as able to facilitate MV liberation  - increasing trach secretions and change in color but no fever/ WBC> send trach asp - cont full LTVV support, 4-8cc/kg IBW with goal Pplat <  30 and DP<15  - VAP prevention protocol/ PPI - PAD protocol for sedation> minimize now  - wean FiO2 as able for SpO2 >92%  - aggressive pulmonary hygiene - PRN BD - SLP following - cortrak for now pending MV liberation, may need PEG at some point  - PT and OOB with TSO brace   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% with global hypokinesis of the left ventricle and grade 1 diastolic  dysfunction 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  Qtc 444, cont to monitor  - d/c dilaudid  gtt and oxy> change to prn IV dilaudid  to help promote PT/ MV liberation efforts  Nonoliguric AKI Urinary retention  Hyperkalemia,  - Nephrology s/o 11/21 - s/p foley placement by urology 11/16 given pendulous and bulbar urethral injuries / false pass -> removed 11/26 but ongoing retention> required Urology placement for Foley 05/10/24  P: - cont foley for additional 7-10 days per urology - cont daily lokelma  - decrease enteral bicarb - trend renal indices/ I/Os, UOP  Chronic anemia - trend CBC periodically - transfuse per protocol    Protein calorie malnutrition -  cont EN per RD via cortrak, MVI.  Monitor Na, add back FWF if increasing  Stage II pressure ulcer sacrum, present on admission - cont wound care - maximize nutrition, frequent turns  Deconditioning  Heparin  for VTE ppx PPI not warranted   Family Patient updated 05/09/24 -> later called common law wife Warren Benders [phone]. ).  Pending update 11/28    ATTESTATION & SIGNATURE   CRITICAL CARE Performed by: Lyle Pesa   Total critical care time: 35 minutes  Critical care time was exclusive of separately billable procedures and treating other patients.  Critical care was necessary to treat or prevent imminent or life-threatening deterioration.  Critical care was time spent personally by me on the following activities: development of treatment plan with patient and/or surrogate as well as nursing, discussions with consultants, evaluation of  patient's response to treatment, examination of patient, obtaining history from patient or surrogate, ordering and performing treatments and interventions, ordering and review of laboratory studies, ordering and review of radiographic studies, pulse oximetry and re-evaluation of patient's condition.    Lyle Pesa, NP [facility] 05/10/2024, 10:44 AM  See Amion for pager If no response to pager , please call [phone] until 7pm After 7:00 pm call Elink  [phone]    LABS    PULMONARY No results for input(s): PHART, PCO2ART, PO2ART, HCO3, TCO2, O2SAT in the last 168 hours.  Invalid input(s): PCO2, PO2  CBC Recent Labs  Lab 05/07/24 1004 05/09/24 0751 05/10/24 0230  HGB 8.3* 9.3* 8.1*  HCT 27.0* 30.2* 27.1*  WBC 11.5* 10.4 9.3  PLT 175 169 160    COAGULATION No results for input(s): INR in the last 168 hours.   CARDIAC  No results for input(s): TROPONINI in the last 168 hours. No results for input(s): PROBNP in the last 168 hours.   CHEMISTRY Recent Labs  Lab 05/08/24 0841 05/08/24 1915 05/08/24 2140 05/09/24 0619 05/09/24 2001 05/10/24 0230  NA 133* 132*  --  134* 135 137  K 6.0* 6.2*   < > 5.7* 4.3 4.7  CL 96* 98  --  98 97* 101  CO2 20* 19*  --  18* 26 26  GLUCOSE 102* 96  --  110* 100* 111*  BUN 110* 110*  --  106* 106* 105*  CREATININE 2.77* 2.78*  --  2.71* 2.51* 2.68*  CALCIUM  8.4* 8.1*  --  8.0* 8.3* 8.1*  MG 2.1  --   --  2.1  --  2.1  PHOS 6.6*  --   --  6.3*  --  6.0*   < > = values in this interval not displayed.   Estimated Creatinine Clearance: 35.7 mL/min (A) (by C-G formula based on SCr of 2.68 mg/dL (H)).   LIVER Recent Labs  Lab 05/04/24 0953 05/10/24 0230  AST 11* 10*  ALT <5 <5  ALKPHOS 83 85  BILITOT 0.7 0.8  PROT 6.5 6.1*  ALBUMIN  1.7* 1.5*     INFECTIOUS No results for input(s): LATICACIDVEN, PROCALCITON in the last 168 hours.   ENDOCRINE CBG (last 3)   Recent Labs    05/10/24 0333 05/10/24 0726 05/10/24 0836  GLUCAP 79 56* 99         IMAGING x48h  - image(s) personally visualized  -   highlighted in bold MR LUMBAR SPINE WO CONTRAST Result Date: 05/09/2024 EXAM: MRI LUMBAR SPINE 05/09/2024 02:55:14 PM TECHNIQUE: Multiplanar multisequence MRI of the lumbar spine was performed without the administration of intravenous contrast. COMPARISON: Thoracic MRI 05/09/2024 reported separately. CT chest, abdomen, and pelvis 05/02/2024. Lumbar MRI 04/23/2024. CLINICAL HISTORY: 48 year old male with Staph aureus bacteremia, concerning for epidural abscess. 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 Mostly lumbarized S1 level. This is the same numbering system used on the previous MRI. Correlation with radiographs is recommended prior to any operative intervention. BONES AND ALIGNMENT: Stable vertebral height and alignment. Partially visible sacrum and SI joints appear to remain within normal limits. T12 anterior inferior endplate faint abnormal marrow signal appears new (series 5 image 10). No L1 marrow edema. No other lumbar marrow edema identified. Mildly regressed but not resolved abnormal signal at the L5-S1 disc space and endplates. Residual endplate marrow edema there is asymmetric to the right as before (series 6 image 6). The right facet remains abnormal, infected. Superimposed L3-L4 discitis osteomyelitis with moderate endplate erosion. Marrow edema at that level also mildly regressed. SPINAL CORD: Normal conus medullaris at L1-L2. SOFT TISSUES: Extensive abnormality lumbar paraspinal soft tissues with widespread myositis and muscle edema. Paraspinal phlegmon is maximal at L3-L4 and L5-S1. Medial left lower psoas muscle abscess tracks inferiorly from the L3-L4 disc space (series 8 images 27 and 32) continues into the sacral level (series 8 image 42) and measures up to 12 x 10 x 64 mm (AP by  transverse by CC). This has regressed. Estimated volume: 4 mL. Contralateral septic right facet joint associated posterior paraspinal muscle abscess is 12 x 19 x 18 mm (AP by transverse by CC). This is minimally smaller. Estimated volume: 2 mL. No new paraspinal abscess identified. Partially visible retroperitoneal space edema in the abdomen and pelvis including surrounding the kidneys. Grossly stable kidneys otherwise. L3-L4: Superimposed L3-L4 discitis osteomyelitis with moderate endplate erosion. Marrow edema at that level also mildly regressed. Mild to moderate multifactorial lumbar spinal stenosis related to pathologic disc osteophyte complex with a broad based posterior component (series 8 image 25). Moderate to severe bilateral L3 neural foraminal  stenosis. These findings are stable. No other lumbar spinal stenosis. L5-S1: Mildly regressed but not resolved abnormal signal at the L5-S1 disc space and endplates. Residual endplate marrow edema there is asymmetric to the right as before (series 6 image 6). The right facet remains abnormal, infected. Moderate to severe right L5 neural foraminal stenosis related to pathologic facet and rightward disc osteophyte complex also stable. IMPRESSION: 1. Transitional lumbosacral anatomy with mostly lumbarized S1 level, same numbering used on prior Lumbar MRIs 2. Unresolved discitis/osteomyelitis at L3-L4, on the right at L5-S1, and ongoing associated right side L5-S1 septic facet joint. 3. Mild new abnormal signal at the anterior T12 inferior endplate. Difficult to exclude early New osteomyelitis at that level. Recommend attention on follow-up. 4. 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 Moderate to severe bilateral L3 and right L5 neural foraminal stenosis. 5. Extensive lumbar paraspinal edema and myositis. Mildly regressed left lower psoas muscle abscess (estimated 4 mL now) and minimally smaller right L5-S1 posterior facet joint abscess  (estimated 2 mL). Electronically signed by: Helayne Hurst MD 05/09/2024 04:29 PM EST RP Workstation: [id number]   MR THORACIC SPINE WO CONTRAST Result Date: 05/09/2024 EXAM: MRI THORACIC SPINE WITHOUT INTRAVENOUS CONTRAST 05/09/2024 02:55:14 PM TECHNIQUE: Multiplanar multisequence MRI of the thoracic spine was performed without the administration of intravenous contrast. COMPARISON: Cervical spine MRI 05/08/2024. CT chest, abdomen, and pelvis 05/02/2024. Thoracic spine MRI 04/23/2024. CLINICAL HISTORY: 48 year old male with Staph aureus bacteremia. Concerning for epidural abscess. 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 FINDINGS: BONES AND ALIGNMENT: Abnormal widening and soft tissue at the C1-C2 articulation redemonstrated. Normal thoracic segmentation on the comparison CT. Ongoing, mildly progressed STIR hyperintense marrow edema at the T5 and T6 vertebral bodies surrounding the abnormal T5-T6 disc space. Endplate erosions at T5-T6, better demonstrated on the recent CT. No significant loss of vertebral body height at T5-T6 compared to the earlier MRI. No posterior marrow edema at T5-T6. Lower thoracic endplate osteophytosis redemonstrated. Stable and benign appearing T9 inferior endplate small focus of STIR hyperintensity. No new levels of marrow edema or infection identified. SPINAL CORD: No convincing abnormal thoracic spinal cord signal. Conus medullaris occurs below T12. SOFT TISSUES: Partially visible abnormal lungs, in part related to septic emboli. Small but lobulated bilateral pleural effusions. Partially visible retained secretions in the trachea. Negative visible upper abdominal viscera. No significant thoracic paraspinal phlegmon. No paraspinal soft tissue abscess identified. And no significant abnormality of the thoracic spine epidural space. DEGENERATIVE CHANGES: Capacious thoracic spinal canal and no spinal stenosis despite mild disc bulging and irregular endplates at some levels including T5-T6,  T6-T7, T8-T9. IMPRESSION: 1. Unresolved T5T6 discitis-osteomyelitis. But despite endplate erosion, no significant loss of vertebral body height, spinal abscess, or other complicating features at this time. 2. No new levels of thoracic spinal infection on this non-contrast exam. 3. Abnormal lungs, in part related to septic emboli. Small layering and loculated pleural effusions. Electronically signed by: Helayne Hurst MD 05/09/2024 04:15 PM EST RP Workstation: [id number]   MR CERVICAL SPINE WO CONTRAST Result Date: 05/08/2024 CLINICAL DATA:  Follow-up examination for known bacteremia with spinal infection, evaluate for possible epidural abscess. EXAM: MRI CERVICAL SPINE WITHOUT CONTRAST TECHNIQUE: Multiplanar, multisequence MR imaging of the cervical spine was performed. No intravenous contrast was administered. COMPARISON:  Comparison made with prior MRI from 04/23/2024. FINDINGS: Alignment: Examination severely degraded by motion artifact, markedly limiting assessment. Straightening of the normal cervical lordosis. No interval listhesis. Vertebrae: Persistent findings of on going infection at the skull base  involving the C1-2 articulations again seen. Persistent abnormal widening of the landed dental interval up to approximately 10 mm. Basion dens interval measures up to 16 mm. Secondary slight basilar invagination at the ventral aspect of the cervicomedullary junction. Overall, appearance and alignment is relatively stable from prior. Associated prevertebral edema within the upper cervical spinal cord. No other visible new or distant areas of progressive infection elsewhere within the cervical spine on this motion degraded exam. Vertebral body height maintained. Decreased T1 signal intensity noted throughout the visualized bone marrow, nonspecific, but most commonly related to anemia, smoking or obesity. Cord: Grossly normal signal and morphology. No visible epidural abscess or other epidural involvement of  infection on this motion degraded exam. Posterior Fossa, vertebral arteries, paraspinal tissues: Visualized brain and posterior fossa are grossly within normal limits. No skull base infection about the C1-2 articulations as above. Associated prevertebral edema about the upper cervical spine. Neither vertebral artery flow void well seen on this motion degraded exam. Disc levels: Underlying mild for age cervical spondylosis, grossly similar to previous. No significant stenosis. No new or progressive finding on this motion degraded exam. IMPRESSION: 1. Severely motion degraded exam, markedly limiting assessment. 2. Persistent findings of ongoing infection at the skull base involving the atlantodental articulation. Persistent abnormal widening of the atlantodental interval up to 10 mm. Basion dens interval measures up to 16 mm. Secondary slight basilar invagination at the ventral aspect of the cervicomedullary junction. Overall, appearance and alignment is relatively stable from prior. 3. No visible epidural abscess or other epidural involvement of infection on this motion degraded exam. No other visible new or distant sites of infection. Electronically Signed   By: Morene Hoard M.D.   On: 05/08/2024 18:46

## 2024-05-10 NOTE — Progress Notes (Signed)
 Occupational Therapy Treatment Patient Details Name: [name] [name] MRN: [id number] DOB: July 26, 1975 Today's Date: 05/10/2024   History of present illness 48 yo M recently admitted with MSSA PNA MSSA bacteremia psoas abscesses and lumbar osteo 03/14/24-03/25/24 (left AMA) who presented to [facility] ED 04/14/24 w AMS. Admitted with sepsis and transferred to [facility]. Arrived to ED covered in feces, altered, with poor airway protection and was intubated, Had PEA cardiac arrest on 11/3 with 4 mins CPR extubated on 11/9. 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 PMH: includes: anxiety, HTN   OT comments  Pt not making great progress during OT session today. Despite being pre-medicated per RN, pt still with high pain levels, overall hindering his performance. Unable to tolerate HOB elevation without muscle spasms and pt bracing self in the bed in anticipation of pain. More lethargic today, only sustaining wakefulness for ~10-15 seconds before falling back to sleep. Tolerated simple UE exercises as detailed below. Stable vitals on vent via trach. Decreasing OT frequency given pt's lack of progress over the last 3 sessions. Will continue to follow.      If plan is discharge home, recommend the following:  Two people to help with walking and/or transfers;Two people to help with bathing/dressing/bathroom;Direct supervision/assist for medications management;Direct supervision/assist for financial management;Assist for transportation;Help with stairs or ramp for entrance   Equipment Recommendations  Other (comment) (defer)    Recommendations for Other Services      Precautions / Restrictions Precautions Precautions: Fall;Cervical;Back Precaution Booklet Issued: No Recall of Precautions/Restrictions: Impaired Precaution/Restrictions Comments:  Cortrak, trach to vent, [place] [facility] Required Braces or Orthoses: Cervical Brace;Spinal Brace Cervical Brace: Hard collar Spinal Brace: Lumbar corset (for OOB) Restrictions Weight Bearing Restrictions Per Provider Order: No       Mobility Bed Mobility Overal bed mobility: Needs Assistance             General bed mobility comments: trunk sliding and repositioning - max A (given pt tending to lean to the R side of bed)    Transfers                   General transfer comment: NT 2/2 high pain levels and poor tolerance to HOB elevation     Balance                                           ADL either performed or assessed with clinical judgement   ADL Overall ADL's : Needs assistance/impaired Eating/Feeding: NPO Eating/Feeding Details (indicate cue type and reason): Cortrak                                        Extremity/Trunk Assessment              Vision       Perception     Praxis     Communication Communication Communication: Impaired Factors Affecting Communication: Trach/intubated   Cognition Arousal: Lethargic Behavior During Therapy: Flat affect               OT - Cognition Comments: pt lethargic, but awakens for ~10-15 secs at a time, difficult to sustain wakefulness especially since pt  cannot tolerate HOB elevation 2/2 pain; requiring frequent redirection to task                 Following commands: Impaired Following commands impaired: Follows one step commands inconsistently, Follows one step commands with increased time      Cueing   Cueing Techniques: Verbal cues, Tactile cues, Gestural cues  Exercises Exercises: General Upper Extremity General Exercises - Upper Extremity Shoulder Flexion: AAROM, PROM, Supine, Both, 5 reps Elbow Flexion: PROM, AAROM, Both, 10 reps, Supine Elbow Extension: PROM, AAROM, Both, 10 reps, Supine    Shoulder Instructions       General Comments on PRVC  30% FiO2, PEEP 5, VSS with supine exercises/activity    Pertinent Vitals/ Pain       Pain Assessment Pain Assessment: Faces Faces Pain Scale: Hurts whole lot Pain Location: random grimacing with various movements of extremities Pain Descriptors / Indicators: Grimacing, Discomfort Pain Intervention(s): Limited activity within patient's tolerance, Monitored during session, Repositioned, Premedicated before session  Home Living                                          Prior Functioning/Environment              Frequency  Min 2X/week        Progress Toward Goals  OT Goals(current goals can now be found in the care plan section)  Progress towards OT goals: Not progressing toward goals - comment     Plan      Co-evaluation                 AM-PAC OT 6 Clicks Daily Activity     Outcome Measure   Help from another person eating meals?: Total Help from another person taking care of personal grooming?: A Little Help from another person toileting, which includes using toliet, bedpan, or urinal?: Total Help from another person bathing (including washing, rinsing, drying)?: A Lot Help from another person to put on and taking off regular upper body clothing?: A Lot Help from another person to put on and taking off regular lower body clothing?: Total 6 Click Score: 10    End of Session Equipment Utilized During Treatment: Oxygen;Cervical collar  OT Visit Diagnosis: Unsteadiness on feet (R26.81);Muscle weakness (generalized) (M62.81);Pain   Activity Tolerance Patient limited by pain   Patient Left in bed;with call bell/phone within reach;with bed alarm set   Nurse Communication          Time: 8372-8355 OT Time Calculation (min): 17 min  Charges: OT General Charges $OT Visit: 1 Visit OT Treatments $Therapeutic Exercise: 8-22 mins  Jorje Vanatta M. [place], OTR/L [facility] [phone] Secure Chat Preferred  Saharsh Sterling 05/10/2024, 5:11 PM

## 2024-05-10 NOTE — Plan of Care (Signed)
  Problem: Education: Goal: Knowledge of General Education information will improve Description: Including pain rating scale, medication(s)/side effects and non-pharmacologic comfort measures Outcome: Not Progressing   Problem: Health Behavior/Discharge Planning: Goal: Ability to manage health-related needs will improve Outcome: Not Progressing   Problem: Clinical Measurements: Goal: Ability to maintain clinical measurements within normal limits will improve Outcome: Not Progressing Goal: Will remain free from infection Outcome: Not Progressing Goal: Diagnostic test results will improve Outcome: Not Progressing Goal: Respiratory complications will improve Outcome: Not Progressing Goal: Cardiovascular complication will be avoided Outcome: Not Progressing   Problem: Activity: Goal: Risk for activity intolerance will decrease Outcome: Not Progressing   Problem: Nutrition: Goal: Adequate nutrition will be maintained Outcome: Not Progressing   Problem: Coping: Goal: Level of anxiety will decrease Outcome: Not Progressing   Problem: Elimination: Goal: Will not experience complications related to bowel motility Outcome: Not Progressing Goal: Will not experience complications related to urinary retention Outcome: Not Progressing   Problem: Pain Managment: Goal: General experience of comfort will improve and/or be controlled Outcome: Not Progressing   Problem: Safety: Goal: Ability to remain free from injury will improve Outcome: Not Progressing   Problem: Skin Integrity: Goal: Risk for impaired skin integrity will decrease Outcome: Not Progressing   Problem: Education: Goal: Knowledge about tracheostomy care/management will improve Outcome: Not Progressing   Problem: Activity: Goal: Ability to tolerate increased activity will improve Outcome: Not Progressing   Problem: Health Behavior/Discharge Planning: Goal: Ability to manage tracheostomy will improve Outcome:  Not Progressing   Problem: Respiratory: Goal: Patent airway maintenance will improve Outcome: Not Progressing   Problem: Role Relationship: Goal: Ability to communicate will improve Outcome: Not Progressing

## 2024-05-11 LAB — CBC
HCT: 26.8 % — ABNORMAL LOW (ref 39.0–52.0)
Hemoglobin: 7.9 g/dL — ABNORMAL LOW (ref 13.0–17.0)
MCH: 28.5 pg (ref 26.0–34.0)
MCHC: 29.5 g/dL — ABNORMAL LOW (ref 30.0–36.0)
MCV: 96.8 fL (ref 80.0–100.0)
Platelets: 165 K/uL (ref 150–400)
RBC: 2.77 MIL/uL — ABNORMAL LOW (ref 4.22–5.81)
RDW: 18.3 % — ABNORMAL HIGH (ref 11.5–15.5)
WBC: 12.4 K/uL — ABNORMAL HIGH (ref 4.0–10.5)
nRBC: 0 % (ref 0.0–0.2)

## 2024-05-11 LAB — BASIC METABOLIC PANEL WITH GFR
Anion gap: 11 (ref 5–15)
BUN: 105 mg/dL — ABNORMAL HIGH (ref 6–20)
CO2: 27 mmol/L (ref 22–32)
Calcium: 8.2 mg/dL — ABNORMAL LOW (ref 8.9–10.3)
Chloride: 100 mmol/L (ref 98–111)
Creatinine, Ser: 2.68 mg/dL — ABNORMAL HIGH (ref 0.61–1.24)
GFR, Estimated: 29 mL/min — ABNORMAL LOW (ref >60)
Glucose, Bld: 117 mg/dL — ABNORMAL HIGH (ref 70–99)
Potassium: 4.8 mmol/L (ref 3.5–5.1)
Sodium: 138 mmol/L (ref 135–145)

## 2024-05-11 LAB — GLUCOSE, CAPILLARY
Glucose-Capillary: 108 mg/dL — ABNORMAL HIGH (ref 70–99)
Glucose-Capillary: 106 mg/dL — ABNORMAL HIGH (ref 70–99)
Glucose-Capillary: 107 mg/dL — ABNORMAL HIGH (ref 70–99)
Glucose-Capillary: 135 mg/dL — ABNORMAL HIGH (ref 70–99)
Glucose-Capillary: 118 mg/dL — ABNORMAL HIGH (ref 70–99)
Glucose-Capillary: 118 mg/dL — ABNORMAL HIGH (ref 70–99)

## 2024-05-11 MED ORDER — SENNOSIDES 8.8 MG/5ML PO SYRP
10.0000 mL | ORAL_SOLUTION | Freq: Two times a day (BID) | ORAL | Status: DC
  Administered 2024-05-11 – 2024-05-20 (×12): 10 mL
  Filled 2024-05-11 (×25): qty 10

## 2024-05-11 MED ORDER — POLYETHYLENE GLYCOL 3350 17 G PO PACK
17.0000 g | PACK | Freq: Two times a day (BID) | ORAL | Status: DC
  Administered 2024-05-11 – 2024-05-23 (×15): 17 g
  Filled 2024-05-11 (×15): qty 1

## 2024-05-11 MED ORDER — ALPRAZOLAM 0.5 MG PO TABS
1.0000 mg | ORAL_TABLET | Freq: Four times a day (QID) | ORAL | Status: DC
  Administered 2024-05-11 – 2024-05-14 (×12): 1 mg
  Filled 2024-05-11 (×12): qty 2

## 2024-05-11 MED ORDER — GABAPENTIN 250 MG/5ML PO SOLN
300.0000 mg | Freq: Three times a day (TID) | ORAL | Status: DC
  Administered 2024-05-11 – 2024-05-19 (×24): 300 mg
  Filled 2024-05-11 (×29): qty 6

## 2024-05-11 MED ORDER — CEFAZOLIN SODIUM-DEXTROSE 2-4 GM/100ML-% IV SOLN
2.0000 g | Freq: Three times a day (TID) | INTRAVENOUS | Status: DC
  Administered 2024-05-15 – 2024-05-20 (×14): 2 g via INTRAVENOUS
  Filled 2024-05-11 (×16): qty 100

## 2024-05-11 NOTE — Plan of Care (Signed)
  Problem: Education: Goal: Knowledge of General Education information will improve Description: Including pain rating scale, medication(s)/side effects and non-pharmacologic comfort measures Outcome: Progressing   Problem: Health Behavior/Discharge Planning: Goal: Ability to manage health-related needs will improve Outcome: Progressing   Problem: Clinical Measurements: Goal: Ability to maintain clinical measurements within normal limits will improve Outcome: Not Progressing Goal: Will remain free from infection Outcome: Not Progressing Goal: Diagnostic test results will improve Outcome: Not Progressing Goal: Respiratory complications will improve Outcome: Not Progressing Goal: Cardiovascular complication will be avoided Outcome: Not Progressing   Problem: Activity: Goal: Risk for activity intolerance will decrease Outcome: Not Progressing   Problem: Nutrition: Goal: Adequate nutrition will be maintained Outcome: Not Progressing   Problem: Coping: Goal: Level of anxiety will decrease Outcome: Not Progressing   Problem: Elimination: Goal: Will not experience complications related to bowel motility Outcome: Not Progressing Goal: Will not experience complications related to urinary retention Outcome: Not Progressing   Problem: Pain Managment: Goal: General experience of comfort will improve and/or be controlled Outcome: Not Progressing   Problem: Safety: Goal: Ability to remain free from injury will improve Outcome: Not Progressing   Problem: Skin Integrity: Goal: Risk for impaired skin integrity will decrease Outcome: Not Progressing   Problem: Education: Goal: Knowledge about tracheostomy care/management will improve Outcome: Not Progressing   Problem: Activity: Goal: Ability to tolerate increased activity will improve Outcome: Not Progressing   Problem: Health Behavior/Discharge Planning: Goal: Ability to manage tracheostomy will improve Outcome: Not  Progressing   Problem: Respiratory: Goal: Patent airway maintenance will improve Outcome: Not Progressing   Problem: Role Relationship: Goal: Ability to communicate will improve Outcome: Not Progressing

## 2024-05-11 NOTE — Progress Notes (Signed)
 NAME:  [name] [name], MRN:  [id number], DOB:  1975-09-16, LOS: 27 ADMISSION DATE:  04/14/2024, CONSULTATION DATE:  04/14/2024 REFERRING MD:  S[place] - [facility] EDP, CHIEF COMPLAINT:  AMS  History of Present Illness:  48 year old man who presented to [facility] ED 11/2 with AMS. PMHx significant for MSSA bacteremia, MSSA PNA, infective endocarditis, cervical through lumbar osteomyelitis, psoas abscess, severe protein calorie malnutrition/FTT, substance abuse. Recent admission 10/2 - 10/13 for sepsis, widespread MSSA.  Arrived to [facility] ED covered in feces, altered and with poor airway protection; he was subsequently intubated. CXR demonstrated RLL opacities. Confirmed bilateral septic pulmonary emboli, tricuspid valve endocarditis.  Required intubation and mechanical ventilation. Clinically progressed and was transferred out of ICU 11/10.  Reconsulted 11/13 for AMS, tachypnea and tachycardia.   Pertinent Medical History:  MSSA bacteremia MSSA PNA Lumbar osteomyelitis  Psoas abscess  Severe protein calorie malnutrition FTT in adult  Significant Hospital Events: Including procedures, antibiotic start and stop dates in addition to other pertinent events   11/2 [facility] ED w AMS. Intubated. Started on vanc mero. Txf request to [place] to [facility]  11/3 Extubated, developed respiratory distress requiring reintubation. Brief cardiac arrest during intubation.  11/3 Transthoracic echocardiogram > LVEF 40-45%, global hypokinesis, grade 1 diastolic dysfunction, normal RV function.  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  No mitral or aortic valve involvement 11/3 CT Chest, abdomen, pelvis >> numerous bilateral cavitary and solid nodules consistent with septic emboli, left lower lobe consolidation with a small pleural effusion, right basilar atelectasis.  Moderate free fluid in the pelvis.  Erosive changes at T5-T6, L2-L3, L4-L5 suggestive of discitis 11/3 CT Head >> no acute abnormality.   Opacified left maxillary sinus 11/10 Transferred out of ICU. 11/13 PCCM reconsulted for decreased mental status, tachycardia, tachypnea. Started diuretics 11/16 Poorly responsive, transferred back to ICU and intubated for the third time. Pressors started post intubation. Renal function worse. UOP poor.  Trach aspirate - Candida 11/20 NO acute events overnight, awake and following commands.  Patient and spouse met with palliative care and collectively decision was made to proceed with trach and PEG if needed Blood culture - NEG BAL -0 normal flora 11/21 no acute events overnight, hemoglobin remained stable 11/23 pain, discomfort, muscle spasms 11/24 CCS trach, cortrak, off precedex  11/25 PSV 5/5 for 8hrs 11/26 - In better place mentally, motivated to continue to get better.  Thankful for care being provided. C/o of ongoing neck and back pain. Asking not to tell him when on PSV so he doesn't get more anxious Did ATC briefly Went for MRI but too much motion artifcat despite precedex  and intermitten fent/versed  and propofol  .  11/27   needs MRI again but concerns for anxiety and motion again. LE weaker than UE. Now on  ATC Trach 40%. Afebrilre. Vent overnight. C collar remains. RN sas stage 2 decub slowly progressing to stage 3.   Family request change over from klonopin  to xanax , started on methadone   11/28 - Placed on dilaudid  gtt last evening for ongoing pain, Currently rates 5/10. MRI - spine nnil acute Increaesd respioratiory secretions and o2 needs up  =-> started Ceftriaxone  and Flagyl  (Ancef  needs to be restarted 12/2 and continued through 12/16) Cutlure growing ABUNDANT GNR  Interim History/Subjective:    11/29 on vent 40% fio2. On TF. Off dilaudid  gtt  Afebfilr. CNS Meds: D3 METHADONE , on Xanax  scheduled. On Gababpentin scheduled. On Robaxin  scheudle. For first time he reports PAIN BETTER  Pharm D reports:cnonstipated x 3 days  Objective:   Blood pressure (!) 133/104, pulse (!)  103, temperature 98.7 F (37.1 C), temperature source Oral, resp. rate (!) 24, height 6' (1.829 m), weight 73.2 kg, SpO2 96%.    Vent Mode: PRVC FiO2 (%):  [30 %-40 %] 40 % Set Rate:  [18 bmp] 18 bmp Vt Set:  [470 mL] 470 mL PEEP:  [5 cmH20] 5 cmH20 Pressure Support:  [10 cmH20] 10 cmH20 Plateau Pressure:  [10 cmH20-15 cmH20] 15 cmH20   Intake/Output Summary (Last 24 hours) at 05/11/2024 1134 Last data filed at 05/11/2024 0915 Gross per 24 hour  Intake 1815.51 ml  Output 1475 ml  Net 340.51 ml   Filed Weights   05/09/24 0139 05/10/24 0500 05/11/24 0500  Weight: 75.3 kg 73.2 kg 73.2 kg   Physical Examination:  General Appearance:  Looks criticall ill.  Head:  Normocephalic, without obvious abnormality, atraumatic Eyes:  PERRL - yes, conjunctiva/corneas - muddy     Ears:  Normal external ear canals, both ears Nose:  G tube - no Throat:  ETT TUBE - no , OG tube - no.  TRACH +. HARD C COLLAR + Neck:  Supple,  No enlargement/tenderness/nodules Lungs: Clear to auscultation bilaterally, Vent synchrony - + Heart:  S1 and S2 normal, no murmur, CVP - no.  Pressors - no Abdomen:  Soft, no masses, no organomegaly Genitalia / Rectal:  Not done Extremities:  Extremities- intact Skin:  ntact in exposed areas . Sacral area - not examined Neurologic:  Sedation - dialudid prn, methadone  d3, robaxin , gabapentin  -> RASS - 0 . Moves all 4s - UE > LE. CAM-ICU - neg . Orientation - x3. Less anxious       MRI lumbar 11/27 1. Transitional lumbosacral anatomy with mostly lumbarized S1 level, same numbering used on prior Lumbar MRIs 2. Unresolved discitis/osteomyelitis at L3-L4, on the right at L5-S1, and ongoing associated right side L5-S1 septic facet joint. 3. Mild new abnormal signal at the anterior T12 inferior endplate. Difficult to exclude early New osteomyelitis at that level. Recommend attention on follow-up. 4. Infection-related mild to moderate lumbar spinal stenosis at L3-L4  only. Moderate to severe bilateral L3 and right L5 neural foraminal stenosis. 5. Extensive lumbar paraspinal edema and myositis. Mildly regressed left lower psoas muscle abscess (estimated 4 mL now) and minimally smaller right L5-S1 posterior facet joint abscess (estimated 2 mL)  MRI thoracic 11/27 1. Unresolved T5T6 discitis-osteomyelitis. But despite endplate erosion, no significant loss of vertebral body height, spinal abscess, or other complicating features at this time. 2. No new levels of thoracic spinal infection on this non-contrast exam. 3. Abnormal lungs, in part related to septic emboli. Small layering and loculated pleural effusions   Resolved Problem List:  Postintubation hypotension Metabolic encephalopathy secondary to hypercarbia, resolved  Assessment and Plan:   MSSA with Dissemination -  infective endocarditis of the tricuspid valve - last echo 04/15/24 -  lumbar osteomyelitis - Psoas abscess - septic pulmonary emboli - C-spine septic arthritis   - - completed Zyvox  11/16; On Ancef  through 05/28/21   - - 11/20 Bcxs negative   Likely VAP 05/10/24 and on Ceftriaxone  and Flagyl  11/29 - Vent better. Culture gorwing GNR   P:   - ID following, appreciate recs - 6-8 weeks of cefazolin  (11/2> end date 12/16) ; sus[pended due to VAP Rx - do ceftraixone and flagyl through 11/28 - 05/14/24 - at some point next few to several weeks need repeeat  ECHO   C Spine Osteop - last seen by NSGY 04/24/24  - 11/27 MRI - - MRI lumbar and thoracic as above> unresolved but stable thoracic imaging, lumbar imaging noted regressed psoas muscle and posterior facet joint abscesses but noted new abnormal signal at anterior T12 endplate, unable to exclude new osteomyelitis  Plan - cont c-collar and LSO brace when upright for pain - will need 6 week f/u with NSGY around week of 12/22 - , recs for medical management, not a surgical candidate at that  - PMT following peripherally,  appreciate assistance, remains full code   Acute  on chronic hypoxic and hypercapnic respiratory failure secondary to MSSA pneumonia, ARDS, mucous plugging  and Septic pulmonary emboli - Tracheostomy placement 11/24  11/29 since gpoing on sedation gtt 2d ago for MRI and current likely VAP not doing ATC but improving  P:  - eventual goal ATC> thus far not tolerated > 2hrs due to anxiety and yest requiring heavy sedation for MRI - minimize sedating meds as able to facilitate MV liberation  - cont full LTVV support, 4-8cc/kg IBW with goal Pplat <30 and DP<15  - VAP prevention protocol/ PPI - PAD protocol for sedation> minimize now  - wean FiO2 as able for SpO2 >92%  - aggressive pulmonary hygiene - PRN BD - SLP following - cortrak for now pending MV liberation, may need PEG at some point  - PT and OOB with TSO brace   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% with global hypokinesis of the left ventricle and grade 1 diastolic dysfunction 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  Qtc 444, cont to monitor  -  prn IV dilaudid  to help promote PT/ MV liberation efforts  Nonoliguric AKI Urinary retention  Hyperkalemia,  - Nephrology s/o 11/21 - s/p foley placement by urology 11/16 given pendulous and bulbar urethral injuries / false pass -> removed 11/26 but ongoing retention> required Urology placement for Foley 05/10/24   P: - cont foley for additional 7-10 days per urology from 05/10/24 - cont daily  lokelma  - decrease enteral bicarb - trend renal indices/ I/Os, UOP  Chronic anemia  - no bleeding 05/11/24  - trend CBC periodically - transfuse per protocol    Protein calorie malnutrition -  cont EN per RD via cortrak, MVI.  Monitor Na, add back FWF if increasing  Stage II pressure ulcer sacrum, present on admission - cont wound care - maximize nutrition, frequent turns  Deconditioning  Heparin  for VTE ppx PPI not warranted   Family Patient updated 05/09/24 -> later called common law wife Warren Benders ([phone]. )  05/11/24  - common law wife Warren Benders > updated    ATTESTATION & SIGNATURE   The patient [name] [name] is critically ill with multiple organ systems failure and requires high complexity decision making for assessment and support, frequent evaluation and titration of therapies, application of advanced monitoring technologies and extensive interpretation of multiple databases and  discussion with other appropriate health care personnel such as bedside nurses, social workers, case production designer, theatre/television/film, consultants, respiratory therapists, nutritionists, secretaries etc.,  Critical care time includes but is not restricted to just documentation time. Documentation can happen in parallel or sequential to care time depending on case mix urgency and priorities for the shift. So, overall critical Care Time devoted to patient care services described in this note is  30  Minutes.   This time reflects time of care of this signee Dr Dorethia Cave which includ does not reflect procedure time, or teaching time or supervisory time of PA/NP/Med student/Med Resident etc but could involve care discussion time     Dr. Dorethia Cave, M.D., [facility].C.P Pulmonary and Critical Care Medicine Staff Physician, [facility]  Pulmonary and Critical Care Pager: [phone], If no answer or between  15:00h - 7:00h: call [phone]  05/11/2024 11:34 AM     LABS     PULMONARY No results for input(s): PHART, PCO2ART, PO2ART, HCO3, TCO2, O2SAT in the last 168 hours.  Invalid input(s): PCO2, PO2  CBC Recent Labs  Lab 05/09/24 0751 05/10/24 0230 05/11/24 0511  HGB 9.3* 8.1* 7.9*  HCT 30.2* 27.1* 26.8*  WBC 10.4 9.3 12.4*  PLT 169 160 165    COAGULATION No results for input(s): INR in the last 168 hours.   CARDIAC  No results for input(s): TROPONINI in the last 168 hours. No results for input(s): PROBNP in the last 168 hours.   CHEMISTRY Recent Labs  Lab 05/08/24 0841 05/08/24 1915 05/08/24 2140 05/09/24 0619 05/09/24 2001 05/10/24 0230 05/11/24 0511  NA 133* 132*  --  134* 135 137 138  K 6.0* 6.2*   < > 5.7* 4.3 4.7 4.8  CL 96* 98  --  98 97* 101 100  CO2 20* 19*  --  18* 26 26 27   GLUCOSE 102* 96  --  110* 100* 111* 117*  BUN 110* 110*  --  106* 106* 105* 105*  CREATININE 2.77* 2.78*  --  2.71* 2.51* 2.68* 2.68*  CALCIUM  8.4* 8.1*  --  8.0* 8.3* 8.1* 8.2*  MG 2.1  --   --  2.1  --  2.1  --   PHOS 6.6*  --   --  6.3*  --  6.0*  --    < > = values in this interval not displayed.   Estimated Creatinine Clearance: 35.7 mL/min (A) (by C-G formula based on SCr of 2.68 mg/dL (H)).   LIVER Recent Labs  Lab 05/10/24 0230  AST 10*  ALT <5  ALKPHOS 85  BILITOT 0.8  PROT 6.1*  ALBUMIN  1.5*     INFECTIOUS No results for input(s): LATICACIDVEN, PROCALCITON in the last 168 hours.   ENDOCRINE CBG (last 3)  Recent Labs    05/11/24 0311 05/11/24 0725 05/11/24 1101  GLUCAP 108* 106* 107*         IMAGING x48h  - image(s) personally visualized  -   highlighted in bold MR LUMBAR SPINE WO CONTRAST Result Date: 05/09/2024 EXAM: MRI LUMBAR SPINE 05/09/2024 02:55:14 PM TECHNIQUE: Multiplanar multisequence MRI of the lumbar spine was performed without the administration of intravenous contrast. COMPARISON: Thoracic MRI 05/09/2024 reported separately. CT chest, abdomen, and pelvis 05/02/2024.  Lumbar MRI 04/23/2024. CLINICAL HISTORY: 48 year old male with Staph aureus bacteremia, concerning for epidural abscess. 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 Mostly lumbarized S1 level. This is the same numbering  system used on the previous MRI. Correlation with radiographs is recommended prior to any operative intervention. BONES AND ALIGNMENT: Stable vertebral height and alignment. Partially visible sacrum and SI joints appear to remain within normal limits. T12 anterior inferior endplate faint abnormal marrow signal appears new (series 5 image 10). No L1 marrow edema. No other lumbar marrow edema identified. Mildly regressed but not resolved abnormal signal at the L5-S1 disc space and endplates. Residual endplate marrow edema there is asymmetric to the right as before (series 6 image 6). The right facet remains abnormal, infected. Superimposed L3-L4 discitis osteomyelitis with moderate endplate erosion. Marrow edema at that level also mildly regressed. SPINAL CORD: Normal conus medullaris at L1-L2. SOFT TISSUES: Extensive abnormality lumbar paraspinal soft tissues with widespread myositis and muscle edema. Paraspinal phlegmon is maximal at L3-L4 and L5-S1. Medial left lower psoas muscle abscess tracks inferiorly from the L3-L4 disc space (series 8 images 27 and 32) continues into the sacral level (series 8 image 42) and measures up to 12 x 10 x 64 mm (AP by transverse by CC). This has regressed. Estimated volume: 4 mL. Contralateral septic right facet joint associated posterior paraspinal muscle abscess is 12 x 19 x 18 mm (AP by transverse by CC). This is minimally smaller. Estimated volume: 2 mL. No new paraspinal abscess identified. Partially visible retroperitoneal space edema in the abdomen and pelvis including surrounding the kidneys. Grossly stable kidneys otherwise. L3-L4: Superimposed L3-L4 discitis osteomyelitis with moderate endplate  erosion. Marrow edema at that level also mildly regressed. Mild to moderate multifactorial lumbar spinal stenosis related to pathologic disc osteophyte complex with a broad based posterior component (series 8 image 25). Moderate to severe bilateral L3 neural foraminal stenosis. These findings are stable. No other lumbar spinal stenosis. L5-S1: Mildly regressed but not resolved abnormal signal at the L5-S1 disc space and endplates. Residual endplate marrow edema there is asymmetric to the right as before (series 6 image 6). The right facet remains abnormal, infected. Moderate to severe right L5 neural foraminal stenosis related to pathologic facet and rightward disc osteophyte complex also stable. IMPRESSION: 1. Transitional lumbosacral anatomy with mostly lumbarized S1 level, same numbering used on prior Lumbar MRIs 2. Unresolved discitis/osteomyelitis at L3-L4, on the right at L5-S1, and ongoing associated right side L5-S1 septic facet joint. 3. Mild new abnormal signal at the anterior T12 inferior endplate. Difficult to exclude early New osteomyelitis at that level. Recommend attention on follow-up. 4. 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 Moderate to severe bilateral L3 and right L5 neural foraminal stenosis. 5. Extensive lumbar paraspinal edema and myositis. Mildly regressed left lower psoas muscle abscess (estimated 4 mL now) and minimally smaller right L5-S1 posterior facet joint abscess (estimated 2 mL). Electronically signed by: Helayne Hurst MD 05/09/2024 04:29 PM EST RP Workstation: [id number]   MR THORACIC SPINE WO CONTRAST Result Date: 05/09/2024 EXAM: MRI THORACIC SPINE WITHOUT INTRAVENOUS CONTRAST 05/09/2024 02:55:14 PM TECHNIQUE: Multiplanar multisequence MRI of the thoracic spine was performed without the administration of intravenous contrast. COMPARISON: Cervical spine MRI 05/08/2024. CT chest, abdomen, and pelvis 05/02/2024. Thoracic spine MRI 04/23/2024.  CLINICAL HISTORY: 48 year old male with Staph aureus bacteremia. Concerning for epidural abscess. 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 FINDINGS: BONES AND ALIGNMENT: Abnormal widening and soft tissue at the C1-C2 articulation redemonstrated. Normal thoracic segmentation on the comparison CT. Ongoing, mildly progressed STIR hyperintense marrow edema at the T5 and T6 vertebral bodies surrounding the abnormal T5-T6 disc space. Endplate erosions at T5-T6, better demonstrated on  the recent CT. No significant loss of vertebral body height at T5-T6 compared to the earlier MRI. No posterior marrow edema at T5-T6. Lower thoracic endplate osteophytosis redemonstrated. Stable and benign appearing T9 inferior endplate small focus of STIR hyperintensity. No new levels of marrow edema or infection identified. SPINAL CORD: No convincing abnormal thoracic spinal cord signal. Conus medullaris occurs below T12. SOFT TISSUES: Partially visible abnormal lungs, in part related to septic emboli. Small but lobulated bilateral pleural effusions. Partially visible retained secretions in the trachea. Negative visible upper abdominal viscera. No significant thoracic paraspinal phlegmon. No paraspinal soft tissue abscess identified. And no significant abnormality of the thoracic spine epidural space. DEGENERATIVE CHANGES: Capacious thoracic spinal canal and no spinal stenosis despite mild disc bulging and irregular endplates at some levels including T5-T6, T6-T7, T8-T9. IMPRESSION: 1. Unresolved T5T6 discitis-osteomyelitis. But despite endplate erosion, no significant loss of vertebral body height, spinal abscess, or other complicating features at this time. 2. No new levels of thoracic spinal infection on this non-contrast exam. 3. Abnormal lungs, in part related to septic emboli. Small layering and loculated pleural effusions. Electronically signed by: Helayne Hurst MD 05/09/2024 04:15 PM EST RP Workstation: [id number]

## 2024-05-11 NOTE — Plan of Care (Signed)
 Not Progressing Add All Education: Knowledge of General Education information will improve Add Today at 2338 - Not Progressing by Rolfe Corean HERO, RN Add Health Behavior/Discharge Planning: Ability to manage health-related needs will improve Add Today at 2338 - Not Progressing by Rolfe Corean HERO, RN Add Clinical Measurements: Ability to maintain clinical measurements within normal limits will improve Add Today at 2338 - Not Progressing by Rolfe Corean HERO, RN Add Will remain free from infection Add Today at 2338 - Not Progressing by Rolfe Corean HERO, RN Add Diagnostic test results will improve Add Today at 2338 - Not Progressing by Rolfe Corean HERO, RN Add Respiratory complications will improve Add Today at 2338 - Not Progressing by Rolfe Corean HERO, RN Add Cardiovascular complication will be avoided Add Today at 2338 - Not Progressing by Rolfe Corean HERO, RN Add Activity: Risk for activity intolerance will decrease Add Today at 2338 - Not Progressing by Rolfe Corean HERO, RN Add Nutrition: Adequate nutrition will be maintained Add Today at 2338 - Not Progressing by Rolfe Corean HERO, RN Add Coping: Level of anxiety will decrease Add Today at 2338 - Not Progressing by Rolfe Corean HERO, RN Add Elimination: Will not experience complications related to bowel motility Add Today at 2338 - Not Progressing by Rolfe Corean HERO, RN Add Will not experience complications related to urinary retention Add Today at 2338 - Not Progressing by Rolfe Corean HERO, RN Add Pain Managment: General experience of comfort will improve and/or be controlled Add Today at 2338 - Not Progressing by Rolfe Corean HERO, RN Add Safety: Ability to remain free from injury will improve Add Today at 2338 - Not Progressing by Rolfe Corean HERO, RN Add Skin Integrity: Risk for impaired skin integrity will  decrease Add Today at 2338 - Not Progressing by Rolfe Corean HERO, RN Add Education: Knowledge about tracheostomy care/management will improve Add Today at 2338 - Not Progressing by Rolfe Corean HERO, RN Add Activity: Ability to tolerate increased activity will improve Add Today at 2338 - Not Progressing by Rolfe Corean HERO, RN Add Health Behavior/Discharge Planning: Ability to manage tracheostomy will improve Add Today at 2338 - Not Progressing by Rolfe Corean HERO, RN Add Respiratory: Patent airway maintenance will improve Add Today at 2338 - Not Progressing by Rolfe Corean HERO, RN Add Role Relationship: Ability to communicate will improve Add Today at 2338 - Not Progressing by Rolfe Corean HERO, RN

## 2024-05-11 NOTE — Progress Notes (Signed)
 SLP Cancellation Note  Patient Details Name: [name] [name] MRN: [id number] DOB: 11/28/1975   Cancelled treatment:        Attempted to see pt for PMSV assessment.  Pt still requiring ventilator support.  RN reports working on vent weaning with RT and pt may be more ready tomorrow.  SLP will continue to follow for PMV readiness.     Anette FORBES Grippe, MA, CCC-SLP Acute Rehabilitation Services Office: [phone] 05/11/2024, 10:23 AM

## 2024-05-12 ENCOUNTER — Inpatient Hospital Stay (HOSPITAL_COMMUNITY): Payer: MEDICAID

## 2024-05-12 ENCOUNTER — Other Ambulatory Visit: Payer: Self-pay

## 2024-05-12 LAB — GLUCOSE, CAPILLARY
Glucose-Capillary: 107 mg/dL — ABNORMAL HIGH (ref 70–99)
Glucose-Capillary: 109 mg/dL — ABNORMAL HIGH (ref 70–99)
Glucose-Capillary: 112 mg/dL — ABNORMAL HIGH (ref 70–99)
Glucose-Capillary: 114 mg/dL — ABNORMAL HIGH (ref 70–99)
Glucose-Capillary: 96 mg/dL (ref 70–99)
Glucose-Capillary: 99 mg/dL (ref 70–99)

## 2024-05-12 NOTE — Progress Notes (Signed)
 Discussed Dr. Geronimo regarding patient has history of substance abuse and any chance patient or family member bring or ask drugs in the hospital. Dr. Geronimo stated that wife is clean from IV drugs. However, patient is going to go to SNF, not home. Discussed this issue with patient's nurse Todd RN and he is aware of it and his wife hasn't come to hospital more than 3 days. Talked ID Dr. Epifanio regarding how many lumen of PICC is proper. Dr. Epifanio secure chatted that single lumen is okay. Patient's GFR was 29 and Nephrology was follow up until 11/21 then sign off and there was no indication for HD. Tried to obtain telephone consent from patient's wife, but no answer. HS [employer]

## 2024-05-12 NOTE — Progress Notes (Signed)
 NAME:  [name] [name], MRN:  [id number], DOB:  [birth year], LOS: 28 ADMISSION DATE:  04/14/2024, CONSULTATION DATE:  04/14/2024 REFERRING MD:  [place] - [facility] EDP, CHIEF COMPLAINT:  AMS  History of Present Illness:  48 year old man who presented to [facility] ED 11/2 with AMS. PMHx significant for MSSA bacteremia, MSSA PNA, infective endocarditis, cervical through lumbar osteomyelitis, psoas abscess, severe protein calorie malnutrition/FTT, substance abuse. Recent admission 10/2 - 10/13 for sepsis, widespread MSSA.  Arrived to [facility] ED covered in feces, altered and with poor airway protection; he was subsequently intubated. CXR demonstrated RLL opacities. Confirmed bilateral septic pulmonary emboli, tricuspid valve endocarditis.  Required intubation and mechanical ventilation. Clinically progressed and was transferred out of ICU 11/10.  Reconsulted 11/13 for AMS, tachypnea and tachycardia.   Pertinent Medical History:  MSSA bacteremia MSSA PNA Lumbar osteomyelitis  Psoas abscess  Severe protein calorie malnutrition FTT in adult  Significant Hospital Events: Including procedures, antibiotic start and stop dates in addition to other pertinent events   11/2 [facility] ED w AMS. Intubated. Started on vanc mero. Txf request to [place] to [facility]  11/3 Extubated, developed respiratory distress requiring reintubation. Brief cardiac arrest during intubation.  11/3 Transthoracic echocardiogram > LVEF 40-45%, global hypokinesis, grade 1 diastolic dysfunction, normal RV function.  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  No mitral or aortic valve involvement 11/3 CT Chest, abdomen, pelvis >> numerous bilateral cavitary and solid nodules consistent with septic emboli, left lower lobe consolidation with a small pleural effusion, right basilar atelectasis.  Moderate free fluid in the pelvis.  Erosive changes at T5-T6, L2-L3, L4-L5 suggestive of discitis 11/3 CT Head >> no acute abnormality.   Opacified left maxillary sinus 11/10 Transferred out of ICU. 11/13 PCCM reconsulted for decreased mental status, tachycardia, tachypnea. Started diuretics 11/16 Poorly responsive, transferred back to ICU and intubated for the third time. Pressors started post intubation. Renal function worse. UOP poor.  Trach aspirate - Candida Trorpicals and Calcians 11/20 NO acute events overnight, awake and following commands.  Patient and spouse met with palliative care and collectively decision was made to proceed with trach and PEG if needed Blood culture - NEG BAL - normal flora 11/21 no acute events overnight, hemoglobin remained stable 11/23 pain, discomfort, muscle spasms 11/24 CCS trach, cortrak, off precedex  11/25 PSV 5/5 for 8hrs 11/26 - In better place mentally, motivated to continue to get better.  Thankful for care being provided. C/o of ongoing neck and back pain. Asking not to tell him when on PSV so he doesn't get more anxious Did ATC briefly Went for MRI but too much motion artifcat despite precedex  and intermitten fent/versed  and propofol  .  11/27   needs MRI again but concerns for anxiety and motion again. LE weaker than UE. Now on  ATC Trach 40%. Afebrilre. Vent overnight. C collar remains. RN sas stage 2 decub slowly progressing to stage 3.   Family request change over from klonopin  to xanax , started on methadone   11/28 - Placed on dilaudid  gtt last evening for ongoing pain, Currently rates 5/10. MRI - spine nnil acute Increaesd respioratiory secretions and o2 needs up  =-> started Ceftriaxone  and Flagyl  (Ancef  needs to be restarted 12/2 and continued through 12/16) Cutlure growing ABUNDANT SERRATA  on 05/12/24  11/29 on vent 40% fio2. On TF. Off dilaudid  gtt  Afebrile.  CNS Meds: D3 METHADONE , on Xanax  scheduled. On Gababpentin scheduled. On Robaxin  scheudle. For first time he reports  PAIN BETTER. Pharm D reports:cnonstipated x 3 days -> Laxative given  Interim History/Subjective:    11/30 -VAP with SERRATIA. On TF. On ATC this am .D4 methadone  (QTc ok on 05/12/24). Dilaudid  4mg  as of lunch time yesterday and now 2mg . -> reducing dilaudid  prn need.  Objective:   Blood pressure (!) 132/98, pulse (!) 101, temperature 98.4 F (36.9 C), temperature source Oral, resp. rate 15, height 6' (1.829 m), weight 73.2 kg, SpO2 95%.    Vent Mode: PRVC FiO2 (%):  [40 %-50 %] 50 % Set Rate:  [18 bmp] 18 bmp Vt Set:  [470 mL] 470 mL PEEP:  [5 cmH20] 5 cmH20 Pressure Support:  [5 cmH20] 5 cmH20 Plateau Pressure:  [10 cmH20-14 cmH20] 14 cmH20   Intake/Output Summary (Last 24 hours) at 05/12/2024 1413 Last data filed at 05/12/2024 1100 Gross per 24 hour  Intake 1725.59 ml  Output 1260 ml  Net 465.59 ml   Filed Weights   05/09/24 0139 05/10/24 0500 05/11/24 0500  Weight: 75.3 kg 73.2 kg 73.2 kg   Physical Examination:  General Appearance:  Looks chronic critically ill Head:  Normocephalic, without obvious abnormality, atraumatic Eyes:  PERRL - yes, conjunctiva/corneas - mdd     Ears:  Normal external ear canals, both ears Nose:  G tube - yes Throat:  ETT TUBE - no , OG tube - no. TRACH + Neck:  Supple,  No enlargement/tenderness/nodules Lungs: Clear to auscultation bilaterally, Ventilator   Synchrony - yes but now on ATC Heart:  S1 and S2 normal, no murmur, CVP - no.  Pressors - no Abdomen:  Soft, no masses, no organomegaly Genitalia / Rectal:  Not done Extremities:  Extremities- intact  Skin:  ntact in exposed areas . Sacral area - YES for decub per RN Neurologic:  Sedation - none but on methadone , xanax , gabapentin  -> RASS - 0 . Moves all 4s - yes with better on pperss. CAM-ICU - x . Orientation - followed commands. Has c/p pain intermittently       MRI lumbar 11/27 1. Transitional lumbosacral anatomy with mostly lumbarized S1 level, same numbering used on prior Lumbar MRIs 2. Unresolved discitis/osteomyelitis at L3-L4, on the right at L5-S1, and ongoing  associated right side L5-S1 septic facet joint. 3. Mild new abnormal signal at the anterior T12 inferior endplate. Difficult to exclude early New osteomyelitis at that level. Recommend attention on follow-up. 4. 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 Moderate to severe bilateral L3 and right L5 neural foraminal stenosis. 5. Extensive lumbar paraspinal edema and myositis. Mildly regressed left lower psoas muscle abscess (estimated 4 mL now) and minimally smaller right L5-S1 posterior facet joint abscess (estimated 2 mL)  MRI thoracic 11/27 1. Unresolved T5T6 discitis-osteomyelitis. But despite endplate erosion, no significant loss of vertebral body height, spinal abscess, or other complicating features at this time. 2. No new levels of thoracic spinal infection on this non-contrast exam. 3. Abnormal lungs, in part related to septic emboli. Small layering and loculated pleural effusions   Resolved Problem List:  Postintubation hypotension Metabolic encephalopathy secondary to hypercarbia, resolved  Assessment and Plan:   MSSA with Dissemination -  infective endocarditis of the tricuspid valve - last echo 04/15/24 -  lumbar osteomyelitis - Psoas abscess - septic pulmonary emboli - C-spine septic arthritis   - - completed Zyvox  11/16; On Ancef  through 05/28/21   - - 11/20 Bcxs negative   VAP 05/10/24 - confirmed as SERRATIA 05/12/24    P:   -  ID following, appreciate recs - 6-8 weeks of cefazolin  (11/2> end date 12/16) ; sus[pended due to VAP Rx - do ceftraixone for VAP through 11/28 - 05/14/24  - DC flagyl 05/12/24 - at some point next few to several weeks need repeeat ECHO   C Spine Osteop - last seen by NSGY 04/24/24  - 11/27 MRI - - MRI lumbar and thoracic as above> unresolved but stable thoracic imaging, lumbar imaging noted regressed psoas muscle and posterior facet joint abscesses but noted new abnormal signal at anterior T12  endplate, unable to exclude new osteomyelitis  Plan - cont c-collar and LSO brace when upright for pain - will need 6 week f/u with NSGY around week of 12/22 - , recs for medical management, not a surgical candidate at that  - PMT following peripherally, appreciate assistance, remains full code   Acute  on chronic hypoxic and hypercapnic respiratory failure secondary to MSSA pneumonia, ARDS, mucous plugging  and Septic pulmonary emboli - Tracheostomy placement 11/24  11/30 with VAP control and pain contrl now back on ATC in day  P:  - ATC as tolerartd with progression goal  -rest on vent at night  - pulmonary toileting - SLP following - cortrak for now pending MV liberation, may need PEG at some point  - PT and OOB with TSO brace   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% with global hypokinesis of the left ventricle and grade 1 diastolic dysfunction 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  Qtc 444, cont to monitor  -  prn IV dilaudid  to help promote PT/ MV liberation efforts  Nonoliguric AKI Urinary retention  Hyperkalemia,  - Nephrology s/o 11/21 - s/p foley placement by urology 11/16 given pendulous and bulbar urethral injuries / false pass -> removed 11/26 but ongoing retention> required Urology placement for Foley 05/10/24   P: - cont foley for additional 7-10 days per urology from 05/10/24 - cont daily lokelma  - decrease enteral bicarb - trend renal indices/ I/Os,  UOP  Chronic anemia  - no bleeding 05/11/24 and 05/12/24  - trend CBC periodically - transfuse per protocol    Protein calorie malnutrition -  cont EN per RD via cortrak, MVI.  Monitor Na, add back FWF if increasing  Stage II pressure ulcer sacrum, present on admission - cont wound care - maximize nutrition, frequent turns  Deconditioning  Heparin  for VTE ppx PPI not warranted   Family Patient updated 05/09/24 -> later called common law wife Warren Benders ([phone]. )  05/11/24  - common law wife Warren Benders > updated  05/12/24 -Warren Benders updated over phone  DISPO - LTAC consult - Picc line requestd    ATTESTATION & SIGNATURE      Dr. Dorethia Cave, M.D., [facility].C.P Pulmonary and Critical Care Medicine Staff Physician, [facility] [place] [facility] Critical [facility] Pager: [phone], If no answer or between  15:00h - 7:00h: call [phone]  05/12/2024 2:13 PM     LABS    PULMONARY No results for input(s): PHART, PCO2ART, PO2ART, HCO3, TCO2, O2SAT in the last 168 hours.  Invalid input(s): PCO2, PO2  CBC Recent Labs  Lab 05/09/24 0751 05/10/24 0230 05/11/24 0511  HGB 9.3* 8.1* 7.9*  HCT 30.2* 27.1* 26.8*  WBC 10.4 9.3 12.4*  PLT 169 160 165    COAGULATION No results for input(s): INR in the last 168 hours.   CARDIAC  No results for input(s): TROPONINI in the last 168 hours. No results for input(s): PROBNP in the last 168 hours.   CHEMISTRY Recent Labs  Lab 05/08/24 0841 05/08/24 1915 05/08/24 2140 05/09/24 0619 05/09/24 2001 05/10/24 0230 05/11/24 0511  NA 133* 132*  --  134* 135 137 138  K 6.0* 6.2*   < > 5.7* 4.3 4.7 4.8  CL 96* 98  --  98 97* 101 100  CO2 20* 19*  --  18* 26 26 27   GLUCOSE 102* 96  --  110* 100* 111* 117*  BUN 110* 110*  --  106* 106* 105* 105*  CREATININE 2.77* 2.78*  --  2.71* 2.51* 2.68* 2.68*  CALCIUM  8.4* 8.1*  --  8.0* 8.3* 8.1* 8.2*  MG 2.1  --   --   2.1  --  2.1  --   PHOS 6.6*  --   --  6.3*  --  6.0*  --    < > = values in this interval not displayed.   Estimated Creatinine Clearance: 35.7 mL/min (A) (by C-G formula based on SCr of 2.68 mg/dL (H)).   LIVER Recent Labs  Lab 05/10/24 0230  AST 10*  ALT <5  ALKPHOS 85  BILITOT 0.8  PROT 6.1*  ALBUMIN  1.5*     INFECTIOUS No results for input(s): LATICACIDVEN, PROCALCITON in the last 168 hours.   ENDOCRINE CBG (last 3)  Recent Labs    05/12/24 0305 05/12/24 0751 05/12/24 1058  GLUCAP 96 112* 107*         IMAGING x48h  - image(s) personally visualized  -   highlighted in bold DG CHEST PORT 1 VIEW Result Date: 05/12/2024 EXAM: 1 VIEW(S) XRAY OF THE CHEST 05/12/2024 05:51:00 AM COMPARISON: 05/06/2024 CLINICAL HISTORY: Acute and chronic respiratory failure (acute-on-chronic) (HCC) FINDINGS: LINES, TUBES AND DEVICES: Tracheostomy tube in place with tip 5.5 cm above the carina. Enteric tube in place, coursing below the hemidiaphragm with tip collimated off view. LUNGS AND PLEURA: Persistent bilateral patchy airspace opacities. Small left pleural effusion. No pneumothorax. HEART AND MEDIASTINUM: No acute abnormality of the cardiac and mediastinal silhouettes. BONES AND SOFT TISSUES: No acute osseous abnormality. IMPRESSION: 1. Persistent bilateral patchy airspace opacities. 2. Small left pleural effusion. Electronically signed by: Franky Stanford MD 05/12/2024 11:35 AM EST RP Workstation: [id number]

## 2024-05-13 LAB — GLUCOSE, CAPILLARY
Glucose-Capillary: 100 mg/dL — ABNORMAL HIGH (ref 70–99)
Glucose-Capillary: 117 mg/dL — ABNORMAL HIGH (ref 70–99)
Glucose-Capillary: 111 mg/dL — ABNORMAL HIGH (ref 70–99)
Glucose-Capillary: 113 mg/dL — ABNORMAL HIGH (ref 70–99)
Glucose-Capillary: 115 mg/dL — ABNORMAL HIGH (ref 70–99)
Glucose-Capillary: 102 mg/dL — ABNORMAL HIGH (ref 70–99)

## 2024-05-13 LAB — CBC
HCT: 26.1 % — ABNORMAL LOW (ref 39.0–52.0)
Hemoglobin: 7.7 g/dL — ABNORMAL LOW (ref 13.0–17.0)
MCH: 29.2 pg (ref 26.0–34.0)
MCHC: 29.5 g/dL — ABNORMAL LOW (ref 30.0–36.0)
MCV: 98.9 fL (ref 80.0–100.0)
Platelets: 189 K/uL (ref 150–400)
RBC: 2.64 MIL/uL — ABNORMAL LOW (ref 4.22–5.81)
RDW: 18.2 % — ABNORMAL HIGH (ref 11.5–15.5)
WBC: 13.3 K/uL — ABNORMAL HIGH (ref 4.0–10.5)
nRBC: 0 % (ref 0.0–0.2)

## 2024-05-13 LAB — COMPREHENSIVE METABOLIC PANEL WITH GFR
ALT: <5 U/L (ref 0–44)
AST: 11 U/L — ABNORMAL LOW (ref 15–41)
Albumin: 1.5 g/dL — ABNORMAL LOW (ref 3.5–5.0)
Alkaline Phosphatase: 100 U/L (ref 38–126)
Anion gap: 10 (ref 5–15)
BUN: 113 mg/dL — ABNORMAL HIGH (ref 6–20)
CO2: 29 mmol/L (ref 22–32)
Calcium: 8.4 mg/dL — ABNORMAL LOW (ref 8.9–10.3)
Chloride: 98 mmol/L (ref 98–111)
Creatinine, Ser: 2.73 mg/dL — ABNORMAL HIGH (ref 0.61–1.24)
GFR, Estimated: 28 mL/min — ABNORMAL LOW (ref >60)
Glucose, Bld: 113 mg/dL — ABNORMAL HIGH (ref 70–99)
Potassium: 4.8 mmol/L (ref 3.5–5.1)
Sodium: 137 mmol/L (ref 135–145)
Total Bilirubin: 0.7 mg/dL (ref 0.0–1.2)
Total Protein: 6.5 g/dL (ref 6.5–8.1)

## 2024-05-13 LAB — PHOSPHORUS: Phosphorus: 6.1 mg/dL — ABNORMAL HIGH (ref 2.5–4.6)

## 2024-05-13 LAB — MAGNESIUM: Magnesium: 2.2 mg/dL (ref 1.7–2.4)

## 2024-05-13 MED ORDER — ORAL CARE MOUTH RINSE
15.0000 mL | OROMUCOSAL | Status: DC
  Administered 2024-05-13 – 2024-05-17 (×14): 15 mL via OROMUCOSAL

## 2024-05-13 MED ORDER — ORAL CARE MOUTH RINSE: 15.0000 mL | OROMUCOSAL | Status: DC | PRN

## 2024-05-13 MED ORDER — SODIUM CHLORIDE 0.9% FLUSH: 10.0000 mL | INTRAVENOUS | Status: DC | PRN

## 2024-05-13 NOTE — Evaluation (Signed)
 Passy-Muir Speaking Valve - Evaluation Patient Details  Name: [name] [name] MRN: [id number] Date of Birth: Aug 20, 1975  Today's Date: 05/13/2024 Time: 1209-1231 SLP Time Calculation (min) (ACUTE ONLY): 22 min  Past Medical History:  Past Medical History:  Diagnosis Date   Anxiety    Back pain    Fracture of rib of right side 01/26/2015   Hypertension    Past Surgical History:  Past Surgical History:  Procedure Laterality Date   TEE WITHOUT CARDIOVERSION N/A 01/21/2014   Procedure: TRANSESOPHAGEAL ECHOCARDIOGRAM (TEE) with propofol ;  Surgeon: Dorn JULIANNA Ross, MD;  Location: [facility];  Service: Endoscopy;  Laterality: N/A;   TRACHEOSTOMY TUBE PLACEMENT N/A 05/06/2024   Procedure: CREATION, TRACHEOSTOMY;  Surgeon: Sebastian Moles, MD;  Location: [facility] OR;  Service: General;  Laterality: N/A;   HPI:  [name] [name] is a 69 yom who presented to [facility] ED 11/2 with AMS. Dx sepsis and transferred to [facility].  Intubated 11/2-3; reintubation same day; brief cardiac arrest during intubation; extubated 11/9. Reintubated 11/16; trach 11/24. Cortrak 11/24. Increasing time on TC. Dx bilateral septic pulmonary emboli, tricuspid valve endocarditis, PMHx significant for MSSA bacteremia, MSSA PNA, infective endocarditis, cervical through lumbar osteomyelitis, psoas abscess, severe protein calorie malnutrition/FTT, substance abuse.    Assessment / Plan / Recommendation  Clinical Impression  PLAN: pt may use PMV when staff is present in room; with full supervision. Should not be left alone with valve on.  Anticipate improvements and participation in instrumental swallow study by the end of this week.    Pt demonstrated willingness to try PMV.  Currently with #6 Shiley; cuff deflated. PMV placed for brief intervals while establishing airway access. No signs of air trapping.  RN present to suction. Required constant verbal cues to increase volume, which improved intelligibility.  Voice hoarse; volume low.  VS  remained stable throughout use.  SLP will follow - discussed plan with RN.   SLP Visit Diagnosis: Aphonia (R49.1)    Recommendations for use/ supervision  Patient may use Passy-Muir Speech Valve: During all therapies with supervision PMSV Supervision: Full   SLP Assessment  Patient needs continued Speech Language Pathology Services   Assistance Recommended at Discharge Frequent or constant Supervision/Assistance  Functional Status Assessment Patient has had a recent decline in their functional status and demonstrates the ability to make significant improvements in function in a reasonable and predictable amount of time.  Frequency and Duration min 2x/week  2 weeks    PMSV Trial PMSV was placed for: 20 minutes Able to redirect subglottic air through upper airway: Yes Able to Attain Phonation: Yes Voice Quality: Breathy;Hoarse;Wet Able to Expectorate Secretions: No attempts Level of Secretion Expectoration with PMSV: Tracheal Breath Support for Phonation: Mildly decreased Intelligibility: Intelligibility reduced Word: 75-100% accurate Phrase: 50-74% accurate Sentence: 50-74% accurate Respirations During Trial: 17 SpO2 During Trial: 94 % Pulse During Trial: 105   Tracheostomy Tube  Additional Tracheostomy Tube Assessment Fenestrated: No Secretion Description: thick;copious Level of Secretion Expectoration: Tracheal    Vent Dependency  Vent Dependent:  (at night) FiO2 (%): 40 %    Cuff Deflation Trial Tolerated Cuff Deflation: Yes Length of Time for Cuff Deflation Trial: at baseline Behavior: Alert;Confused  [name] [name] L. [place], MA CCC/SLP Clinical Specialist - Acute Care SLP Acute Rehabilitation Services Office number [phone]        [name] [name] 05/13/2024, 1:42 PM

## 2024-05-13 NOTE — Progress Notes (Signed)
   7 Days Post-Op Subjective: Pt alert but not verbal on rounds. Trach collar. NAEON  Objective: Vital signs in last 24 hours: Temp:  [97.2 F (36.2 C)-98.8 F (37.1 C)] 97.5 F (36.4 C) (12/01 1126) Pulse Rate:  [67-107] 101 (12/01 1030) Resp:  [4-21] 7 (12/01 1030) BP: (86-142)/(53-102) 103/75 (12/01 1030) SpO2:  [87 %-100 %] 95 % (12/01 1030) FiO2 (%):  [40 %-50 %] 40 % (12/01 0826)  Assessment/Plan: #urinary retention #false passage  Cystoscopy with over wire foley placement on 11/16 and 11/27 respectively for false passage. Discharge with foley and follow up in clinic. Exchange catheter over wire every 30 days. Contact Urology if extended admission necessitates foley exchange. Do not remove otherwise.   Urology will sign off at this time. Please call with questions  Intake/Output from previous day: 11/30 0701 - 12/01 0700 In: 1901.8 [NG/GT:1735; IV Piggyback:166.8] Out: 1250 [Urine:1250]  Intake/Output this shift: Total I/O In: 165 [NG/GT:165] Out: 225 [Urine:225]  Physical Exam:  General: Alert and oriented CV: No cyanosis Lungs: equal chest rise Abdomen: Soft, NTND, no rebound or guarding Gu: 85f council tip catheter in place  Lab Results: Recent Labs    05/11/24 0511 05/13/24 0742  HGB 7.9* 7.7*  HCT 26.8* 26.1*   BMET Recent Labs    05/11/24 0511 05/13/24 0742  NA 138 137  K 4.8 4.8  CL 100 98  CO2 27 29  GLUCOSE 117* 113*  BUN 105* 113*  CREATININE 2.68* 2.73*  CALCIUM  8.2* 8.4*  HGB 7.9* 7.7*  WBC 12.4* 13.3*     Studies/Results: US  EKG SITE RITE Result Date: 05/12/2024 If Site Rite image not attached, placement could not be confirmed due to current cardiac rhythm.  DG CHEST PORT 1 VIEW Result Date: 05/12/2024 EXAM: 1 VIEW(S) XRAY OF THE CHEST 05/12/2024 05:51:00 AM COMPARISON: 05/06/2024 CLINICAL HISTORY: Acute and chronic respiratory failure (acute-on-chronic) (HCC) FINDINGS: LINES, TUBES AND DEVICES: Tracheostomy tube in place  with tip 5.5 cm above the carina. Enteric tube in place, coursing below the hemidiaphragm with tip collimated off view. LUNGS AND PLEURA: Persistent bilateral patchy airspace opacities. Small left pleural effusion. No pneumothorax. HEART AND MEDIASTINUM: No acute abnormality of the cardiac and mediastinal silhouettes. BONES AND SOFT TISSUES: No acute osseous abnormality. IMPRESSION: 1. Persistent bilateral patchy airspace opacities. 2. Small left pleural effusion. Electronically signed by: Franky Stanford MD 05/12/2024 11:35 AM EST RP Workstation: [id number]      LOS: 29 days   Ole Bourdon, NP [facility] Pager: [phone]  05/13/2024, 11:56 AM

## 2024-05-13 NOTE — Progress Notes (Signed)
 Patient ID: [name] [name], male   DOB: Oct 17, 1975, 48 y.o.   MRN: [id number]    Progress Note from the Palliative Medicine Team at [facility]   Patient Name: [name] [name]        Date: 05/13/2024 DOB: 1976/01/28  Age: 48 y.o. MRN#: [id number] Attending Physician: [name] Bourbon, MD Primary Care Physician: [name] Satterfield, MD Admit Date: 04/14/2024   Reason for Consultation/Follow-up   Establishing Goals of Care   HPI/ Brief Hospital Review 48 year old man who presented to [facility] ED 11/2 with AMS. PMHx significant for MSSA bacteremia, MSSA PNA, infective endocarditis, cervical through lumbar osteomyelitis, psoas abscess, severe protein calorie malnutrition/FTT, substance abuse. Recent admission 10/2 - 10/13 for sepsis, widespread MSSA.   Arrived to [facility] ED covered in feces, altered and with poor airway protection; he was subsequently intubated. CXR demonstrated RLL opacities. Confirmed bilateral septic pulmonary emboli, tricuspid valve endocarditis.  Required intubation and mechanical ventilation. Clinically progressed and was transferred out of ICU 11/10.  Significant Events   11/2 [facility] ED w AMS. Intubated.  Transferred to [facility] 11/3 Extubated, developed respiratory distress requiring reintubation. Brief cardiac arrest during intubation.  11/3 Transthoracic echocardiogram > LVEF 40-45%, global hypokinesis 11/3 CT Chest, abdomen, pelvis >> numerous bilateral cavitary and solid nodules consistent with septic emboli, left lower lobe consolidation with a small pleural effusion, right basilar atelectasis.  Erosive changes at T5-T6, L2-L3, L4-L5 suggestive of discitis 11/3 CT Head >> no acute abnormality.  11/10 Transferred out of ICU. 11/13 PCCM reconsulted for decreased mental status, tachycardia, tachypnea 11/16 Poorly responsive, transferred back to ICU and intubated for the third time. Pressors started post intubation. Renal function worse. UOP poor.  Trach aspirate -  Candida Trorpicals and Calcians 11/20 NO acute events overnight, awake and following commands.  Patient and spouse met with palliative care and collectively decision was made to proceed with trach and PEG if needed Blood culture - NEG BAL - normal flora 11/21 no acute events overnight, hemoglobin remained stable 11/23 pain, discomfort, muscle spasms 11/24 CCS trach, cortrak, off precedex  11/25 PSV 5/5 for 8hrs 11/26 - In better place mentally, motivated to continue to get better.  Thankful for care being provided. C/o of ongoing neck and back pain. Asking not to tell him when on PSV so he doesn't get more anxious Did ATC briefly Went for MRI but too much motion artifcat despite precedex  and intermitten fent/versed  and propofol  .  11/27   needs MRI again but concerns for anxiety and motion again. LE weaker than UE. Now on  ATC Trach 40%. Afebrilre. Vent overnight. C collar remains. RN sas stage 2 decub slowly progressing to stage 3.   Family request change over from klonopin  to xanax , started on methadone   11/28 - Placed on dilaudid  gtt last evening for ongoing pain, Currently rates 5/10. MRI - spine nnil acute Increaesd respioratiory secretions and o2 needs up  =-> started Ceftriaxone  and Flagyl  (Ancef  needs to be restarted 12/2 and continued through 12/16) Cutlure growing ABUNDANT SERRATA  on 05/12/24 11/30 Sputum with serratia. Trach collar all day. Day 4 methadone . PRN dilaudid  decreased.    Subjective  Extensive chart review has been completed prior to meeting with patient/family  including labs, vital signs, imaging, progress/consult notes, orders, medications and available advance directive documents.    This NP assessed patient at the bedside as a follow up for palliative medicine needs and emotional support.  No family at bedside.  [name] [name] is weak, eyes  are open maintains eye contact.  He does responds slowly to simple commands.  I reached out to family and was able to  speak to his significant other of 18 years [name] [name].  [name] tells me the patient mother is alive but currently living in a skilled nursing facility herself and unable to be available for any decision making  Education offered on the seriousness of his current medical situation, and the likely long-term projected trajectory in the context of multiple comorbidities.  Discussed that, despite full medical support the overall outcomes remain uncertain.  Education offered on patient's high risk of decompensation secondary to underlying comorbidities, infection, immobility, and trach.       [name] tells me that [employer] and actually signed out AMA on last admission.  She is from familiar with long-term IV therapy having had endocarditis herself in the past where she ended up signing out AMA also.  Education offered on the difference between a full medical support path attempting to prolong life and a palliative comfort path foregoing life-prolonging measures, allowing for natural death.  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  Questions and concerns addressed   Discussed with primary team and nursing staff   Time:  50  minutes  Detailed review of medical records ( labs, imaging, vital signs), medically appropriate exam ( MS, skin, cardiac,  resp)   discussed with treatment team, counseling and education to patient, family, staff, documenting clinical information, medication management, coordination of care    [name] Plants NP  Palliative Medicine Team Team Phone # [phone] Pager [phone]

## 2024-05-13 NOTE — Progress Notes (Signed)
 Physical Therapy Treatment Patient Details Name: [name] [name] MRN: [id number] DOB: 09/19/1975 Today's Date: 05/13/2024   History of Present Illness 48 yo M recently admitted with MSSA PNA MSSA bacteremia psoas abscesses and lumbar osteo 03/14/24-03/25/24 (left AMA) who presented to [facility] ED 04/14/24 w AMS. Admitted with sepsis and transferred to [facility]. Arrived to ED covered in feces, altered, with poor airway protection and was intubated, Had PEA cardiac arrest on 11/3 with 4 mins CPR extubated on 11/9. 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 PMH: includes: anxiety, HTN    PT Comments  RN in room reports he just got his methadone  recently and is increasingly lethargic. Pt is able to rouse to PT voice, but keeps eyes closed. PT provided washcloth and helped pt wash his face. Opens eyes and participated in limited exercises with maximal encouragement and assistance, ultimately falls asleep with PT providing increasing assistance. PT will follow back another day for more mobilization when pt is not so lethargic. D/c plans remain appropriate.     If plan is discharge home, recommend the following: Two people to help with walking and/or transfers;Two people to help with bathing/dressing/bathroom   Can travel by private vehicle     No  Equipment Recommendations  None recommended by PT       Precautions / Restrictions Precautions Precautions: Fall;Cervical;Back Recall of Precautions/Restrictions: Impaired Precaution/Restrictions Comments: Cortrak, trach to vent Required Braces or Orthoses: Cervical Brace;Spinal Brace Cervical Brace: Hard collar (Miami J) Spinal Brace: Lumbar corset (for OOB) Restrictions Weight Bearing Restrictions Per Provider Order: No           Communication Communication Communication: Impaired Factors  Affecting Communication: Trach/intubated (to lethargic to try PMSV)  Cognition Arousal: Obtunded Behavior During Therapy: Flat affect   PT - Cognitive impairments: Difficult to assess Difficult to assess due to: Tracheostomy                       Following commands: Impaired Following commands impaired: Only follows one step commands consistently    Cueing Cueing Techniques: Verbal cues, Tactile cues, Gestural cues  Exercises General Exercises - Upper Extremity Digit Composite Flexion: AROM, Both, Supine General Exercises - Lower Extremity Ankle Circles/Pumps: AROM, Both, 5 reps, Supine (increased cuing) Heel Slides: AAROM, Both, 5 reps, Supine    General Comments General comments (skin integrity, edema, etc.): Trach collar, 10L FiO2 40%      Pertinent Vitals/Pain Pain Assessment Pain Assessment: Faces Pain Score: 1  Pain Location: grimacing with increased ROM Pain Descriptors / Indicators: Grimacing, Discomfort Pain Intervention(s): Limited activity within patient's tolerance     PT Goals (current goals can now be found in the care plan section) Acute Rehab PT Goals PT Goal Formulation: Patient unable to participate in goal setting Time For Goal Achievement: 05/24/24 Potential to Achieve Goals: Poor Progress towards PT goals: Not progressing toward goals - comment    Frequency    Min 2X/week       AM-PAC PT 6 Clicks Mobility   Outcome Measure  Help needed turning from your back to your side while in a flat bed without using bedrails?: Total Help needed moving from lying on your back to sitting on the side of a flat bed without using bedrails?: Total Help needed moving to and from a bed to a chair (including a wheelchair)?: Total Help  needed standing up from a chair using your arms (e.g., wheelchair or bedside chair)?: Total Help needed to walk in hospital room?: Total Help needed climbing 3-5 steps with a railing? : Total 6 Click Score: 6    End  of Session Equipment Utilized During Treatment: Oxygen Activity Tolerance: Patient limited by fatigue Patient left: in bed;with call bell/phone within reach Nurse Communication: Mobility status PT Visit Diagnosis: Muscle weakness (generalized) (M62.81);Other abnormalities of gait and mobility (R26.89);Pain Pain - part of body:  (back)     Time: 8351-8342 PT Time Calculation (min) (ACUTE ONLY): 9 min  Charges:    $Therapeutic Exercise: 8-22 mins PT General Charges $$ ACUTE PT VISIT: 1 Visit                     Dontasia Miranda B. Fleeta Lapidus PT, DPT Acute Rehabilitation Services Please use secure chat or  Call Office [phone]    [name] 05/13/2024, 5:25 PM

## 2024-05-13 NOTE — Progress Notes (Signed)
 Peripherally Inserted Central Catheter Placement  The IV Nurse has discussed with the patient and/or persons authorized to consent for the patient, the purpose of this procedure and the potential benefits and risks involved with this procedure.  The benefits include less needle sticks, lab draws from the catheter, and the patient may be discharged home with the catheter. Risks include, but not limited to, infection, bleeding, blood clot (thrombus formation), and puncture of an artery; nerve damage and irregular heartbeat and possibility to perform a PICC exchange if needed/ordered by physician.  Alternatives to this procedure were also discussed.  Bard Power PICC patient education guide, fact sheet on infection prevention and patient information card has been provided to patient /or left at bedside.    PICC Placement Documentation  PICC Single Lumen 05/13/24 Right Brachial 37 cm 0 cm (Active)  Indication for Insertion or Continuance of Line Prolonged intravenous therapies 05/13/24 0935  Exposed Catheter (cm) 0 cm 05/13/24 0935  Site Assessment Clean, Dry, Intact 05/13/24 0935  Line Status Flushed;Saline locked;Blood return noted 05/13/24 0935  Dressing Type Transparent;Securing device 05/13/24 0935  Dressing Status Clean, Dry, Intact;Antimicrobial disc/dressing in place 05/13/24 0935  Line Care Connections checked and tightened 05/13/24 0935  Line Adjustment (NICU/IV Team Only) No 05/13/24 0935  Dressing Intervention New dressing;Adhesive placed at insertion site (IV team only) 05/13/24 0935  Dressing Change Due 05/20/24 05/13/24 0935       Jolee Na 05/13/2024, 9:53 AM

## 2024-05-13 NOTE — Progress Notes (Signed)
 NAME:  [name] [name], MRN:  [id number], DOB:  July 02, 1975, LOS: 29 ADMISSION DATE:  04/14/2024, CONSULTATION DATE:  04/14/2024 REFERRING MD:  [place] - [facility] EDP, CHIEF COMPLAINT:  AMS  History of Present Illness:  48 year old man who presented to [facility] ED 11/2 with AMS. PMHx significant for MSSA bacteremia, MSSA PNA, infective endocarditis, cervical through lumbar osteomyelitis, psoas abscess, severe protein calorie malnutrition/FTT, substance abuse. Recent admission 10/2 - 10/13 for sepsis, widespread MSSA.  Arrived to [facility] ED covered in feces, altered and with poor airway protection; he was subsequently intubated. CXR demonstrated RLL opacities. Confirmed bilateral septic pulmonary emboli, tricuspid valve endocarditis.  Required intubation and mechanical ventilation. Clinically progressed and was transferred out of ICU 11/10.  Reconsulted 11/13 for AMS, tachypnea and tachycardia.   Pertinent Medical History:  MSSA bacteremia MSSA PNA Lumbar osteomyelitis  Psoas abscess  Severe protein calorie malnutrition FTT in adult  Significant Hospital Events: Including procedures, antibiotic start and stop dates in addition to other pertinent events   11/2 [facility] ED w AMS. Intubated. Started on vanc mero. Txf request to [place] to [facility]  11/3 Extubated, developed respiratory distress requiring reintubation. Brief cardiac arrest during intubation.  11/3 Transthoracic echocardiogram > LVEF 40-45%, global hypokinesis, grade 1 diastolic dysfunction, normal RV function.  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  No mitral or aortic valve involvement 11/3 CT Chest, abdomen, pelvis >> numerous bilateral cavitary and solid nodules consistent with septic emboli, left lower lobe consolidation with a small pleural effusion, right basilar atelectasis.  Moderate free fluid in the pelvis.  Erosive changes at T5-T6, L2-L3, L4-L5 suggestive of discitis 11/3 CT Head >> no acute abnormality.   Opacified left maxillary sinus 11/10 Transferred out of ICU. 11/13 PCCM reconsulted for decreased mental status, tachycardia, tachypnea. Started diuretics 11/16 Poorly responsive, transferred back to ICU and intubated for the third time. Pressors started post intubation. Renal function worse. UOP poor.  Trach aspirate - Candida Trorpicals and Calcians 11/20 NO acute events overnight, awake and following commands.  Patient and spouse met with palliative care and collectively decision was made to proceed with trach and PEG if needed Blood culture - NEG BAL - normal flora 11/21 no acute events overnight, hemoglobin remained stable 11/23 pain, discomfort, muscle spasms 11/24 CCS trach, cortrak, off precedex  11/25 PSV 5/5 for 8hrs 11/26 - In better place mentally, motivated to continue to get better.  Thankful for care being provided. C/o of ongoing neck and back pain. Asking not to tell him when on PSV so he doesn't get more anxious Did ATC briefly Went for MRI but too much motion artifcat despite precedex  and intermitten fent/versed  and propofol  .  11/27   needs MRI again but concerns for anxiety and motion again. LE weaker than UE. Now on  ATC Trach 40%. Afebrilre. Vent overnight. C collar remains. RN sas stage 2 decub slowly progressing to stage 3.   Family request change over from klonopin  to xanax , started on methadone   11/28 - Placed on dilaudid  gtt last evening for ongoing pain, Currently rates 5/10. MRI - spine nnil acute Increaesd respioratiory secretions and o2 needs up  =-> started Ceftriaxone  and Flagyl  (Ancef  needs to be restarted 12/2 and continued through 12/16) Cutlure growing ABUNDANT SERRATA  on 05/12/24 11/30 Sputum with serratia. Trach collar all day. Day 4 methadone . PRN dilaudid  decreased.   Interim History/Subjective:  Complaining of sore throat vs mouth pain.   Objective:   Blood  pressure (!) 89/61, pulse 85, temperature 97.6 F (36.4 C), temperature source Axillary,  resp. rate 19, height 6' (1.829 m), weight 73.2 kg, SpO2 96%.    Vent Mode: PRVC FiO2 (%):  [40 %-50 %] 40 % Set Rate:  [18 bmp] 18 bmp Vt Set:  [470 mL] 470 mL PEEP:  [5 cmH20] 5 cmH20 Plateau Pressure:  [14 cmH20-15 cmH20] 14 cmH20   Intake/Output Summary (Last 24 hours) at 05/13/2024 0825 Last data filed at 05/13/2024 0700 Gross per 24 hour  Intake 1724.87 ml  Output 1150 ml  Net 574.87 ml   Filed Weights   05/09/24 0139 05/10/24 0500 05/11/24 0500  Weight: 75.3 kg 73.2 kg 73.2 kg   Physical Examination:  General Appearance:  Frail appearing middle aged male chronically ill appearing.  Head:  [place]/AT Eyes:  PERRL Ears:  Normal external ear canals, both ears Nose:  Cortrak Throat:  Trach Neck:  Collar in place. No JVD Lungs: diminished bases Heart:  RRR, no MRG Abdomen:  Soft, NT, nD Genitalia / Rectal:  Not done Extremities:  2-3+ pitting edema bilateral lower extremities.  Neurologic:  RASS 0    MRI cervical 11/11  1. Findings consistent with acute septic arthritis involving the atlantodental articulation at the skull base. Secondary abnormal widening of the atlantodental and basion-dens intervals with mild basilar invagination as above. Superimposed 2.1 cm intra-articular fluid collection at the left aspect of the atlantodental articulation. 2. No other evidence for acute infection elsewhere within the cervical spine. 3. No significant disc pathology for age. No stenosis or neural impingement.  MRI lumbar 11/27 1. Transitional lumbosacral anatomy with mostly lumbarized S1 level, same numbering used on prior Lumbar MRIs 2. Unresolved discitis/osteomyelitis at L3-L4, on the right at L5-S1, and ongoing associated right side L5-S1 septic facet joint. 3. Mild new abnormal signal at the anterior T12 inferior endplate. Difficult to exclude early New osteomyelitis at that level. Recommend attention on follow-up. 4. Infection-related mild to moderate lumbar spinal  stenosis at L3-L4 only. Moderate to severe bilateral L3 and right L5 neural foraminal stenosis. 5. Extensive lumbar paraspinal edema and myositis. Mildly regressed left lower psoas muscle abscess (estimated 4 mL now) and minimally smaller right L5-S1 posterior facet joint abscess (estimated 2 mL)  MRI thoracic 11/27 1. Unresolved T5T6 discitis-osteomyelitis. But despite endplate erosion, no significant loss of vertebral body height, spinal abscess, or other complicating features at this time. 2. No new levels of thoracic spinal infection on this non-contrast exam. 3. Abnormal lungs, in part related to septic emboli. Small layering and loculated pleural effusions   Resolved Problem List:  Postintubation hypotension Metabolic encephalopathy secondary to hypercarbia, resolved  Assessment and Plan:   MSSA with Dissemination complicated by infective endocarditis of the tricuspid valve - last echo 04/15/24, lumbar osteomyelitis, Psoas abscess, septic pulmonary emboli, C-spine septic arthritis, completed Zyvox  11/16; - 11/20 Bcxs negative - On Ancef  through 05/28/21 (On hold for CTX tx of serratia PNA) - ID following  VAP 05/10/24 - confirmed as SERRATIA 05/12/24  - ID following, appreciate recs - ceftriaxone  through 11/28 - 05/14/24 - at some point next few to several weeks need repeat ECHO  C Spine Osteo - last seen by NSGY 04/24/24 - 11/27 MRI - - MRI lumbar and thoracic as above> unresolved but stable thoracic imaging, lumbar imaging noted regressed psoas muscle and posterior facet joint abscesses but noted new abnormal signal at anterior T12 endplate, unable to exclude new osteomyelitis - cont c-collar and LSO brace when  upright for pain - will need 6 week f/u with NSGY around week of 12/22 - recs for medical management, not a surgical candidate at that   Acute  on chronic hypoxic and hypercapnic respiratory failure secondary to MSSA pneumonia, ARDS (resolved), mucous plugging  (improved)  and Septic pulmonary emboli - Tracheostomy placement 11/24 - ATC all day yesterday, will attempt again today.  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 Consider incraseing - TED hose - monitor volume status  Anxiety  Chronic pain History of substance abuse and chronic anxiety P:  - continue robaxin  750mg  q8hrs, gabapentin  100mg  TID, lidoderm  patch, tylenol , and xanax  1mg  q6hrs - cont methadone  (started 11/27), titrate every 5-7 days as needed.  Qtc 417, cont to monitor intermittently - PRN   Nonoliguric AKI Urinary retention  Hyperkalemia,  - Nephrology s/o 11/21 - s/p foley placement by urology 11/16 given pendulous and bulbar urethral injuries / false pass -> removed 11/26 but ongoing retention> required Urology placement for Foley 05/10/24   P: - cont foley for additional 7-10 days per urology from 05/10/24 - cont daily lokelma , bicarb - trend renal indices/ I/Os, UOP  Chronic anemia - trend CBC periodically - transfuse per protocol   Protein calorie malnutrition -  cont EN per RD via cortrak, MVI.    Stage II pressure ulcer sacrum, present on admission - cont wound care - maximize nutrition, frequent turns  Deconditioning  Heparin  for VTE ppx PPI not warranted   DISPO - LTAC consult - Picc line requestd   ATTESTATION & SIGNATURE   Critical care time 48 minutes necessary due to respiratory failure requiring mechanical ventilation.   Deward Eastern, AGACNP-BC [facility]  See Amion for personal pager PCCM on call pager [phone] until 7pm. Please call Elink 7p-7a. [phone]  05/13/2024 9:00 AM

## 2024-05-13 NOTE — Plan of Care (Signed)
  Add All Education: Knowledge of General Education information will improve Add Today at 0155 - Progressing by Rolfe Corean HERO, RN Add Health Behavior/Discharge Planning: Ability to manage health-related needs will improve Add Today at 0155 - Progressing by Rolfe Corean HERO, RN Add Clinical Measurements: Ability to maintain clinical measurements within normal limits will improve Add Today at 0155 - Progressing by Rolfe Corean HERO, RN Add Will remain free from infection Add Today at 0155 - Progressing by Rolfe Corean HERO, RN Add Diagnostic test results will improve Add Today at 0155 - Progressing by Rolfe Corean HERO, RN Add Respiratory complications will improve Add Today at 0155 - Progressing by Rolfe Corean HERO, RN Add Cardiovascular complication will be avoided Add Today at 0155 - Progressing by Rolfe Corean HERO, RN Add Activity: Risk for activity intolerance will decrease Add Today at 0155 - Progressing by Rolfe Corean HERO, RN

## 2024-05-13 NOTE — TOC Progression Note (Signed)
 Transition of Care [facility]) - Progression Note    Patient Details  Name: [name] [name] MRN: [id number] Date of Birth: 1976/03/23  Transition of Care [facility]) CM/SW Contact  Tom-Johnson, Brenley Priore Daphne, RN Phone Number: 05/13/2024, 1:51 PM  Clinical Narrative:     CM spoke with DJ at [facility], states he is awaiting response from [facility] in regards to Letter Of Guarantee.  CM will continue to follow as patient progresses with care towards discharge.                     Expected Discharge Plan and Services                                               Social Drivers of Health (SDOH) Interventions SDOH Screenings   Food Insecurity: No Food Insecurity (03/15/2024)  Housing: Low Risk  (03/15/2024)  Transportation Needs: No Transportation Needs (03/15/2024)  Utilities: Not At Risk (03/15/2024)  Tobacco Use: High Risk (05/06/2024)    Readmission Risk Interventions    04/15/2024    4:16 PM 03/25/2024    9:50 AM 03/22/2024   10:01 AM  Readmission Risk Prevention Plan  Transportation Screening Complete Complete Complete  PCP or Specialist Appt within 5-7 Days Complete    Home Care Screening Complete Complete Complete  Medication Review (RN CM) Referral to Pharmacy Complete Complete

## 2024-05-13 DEATH — deceased

## 2024-05-14 DIAGNOSIS — A4901 Methicillin susceptible Staphylococcus aureus infection, unspecified site: Secondary | ICD-10-CM

## 2024-05-14 DIAGNOSIS — A498 Other bacterial infections of unspecified site: Secondary | ICD-10-CM

## 2024-05-14 LAB — CULTURE, RESPIRATORY W GRAM STAIN

## 2024-05-14 LAB — CBC
HCT: 28.7 % — ABNORMAL LOW (ref 39.0–52.0)
Hemoglobin: 8.4 g/dL — ABNORMAL LOW (ref 13.0–17.0)
MCH: 29.1 pg (ref 26.0–34.0)
MCHC: 29.3 g/dL — ABNORMAL LOW (ref 30.0–36.0)
MCV: 99.3 fL (ref 80.0–100.0)
Platelets: 194 K/uL (ref 150–400)
RBC: 2.89 MIL/uL — ABNORMAL LOW (ref 4.22–5.81)
RDW: 18 % — ABNORMAL HIGH (ref 11.5–15.5)
WBC: 12.2 K/uL — ABNORMAL HIGH (ref 4.0–10.5)
nRBC: 0 % (ref 0.0–0.2)

## 2024-05-14 LAB — GLUCOSE, CAPILLARY
Glucose-Capillary: 105 mg/dL — ABNORMAL HIGH (ref 70–99)
Glucose-Capillary: 110 mg/dL — ABNORMAL HIGH (ref 70–99)
Glucose-Capillary: 111 mg/dL — ABNORMAL HIGH (ref 70–99)
Glucose-Capillary: 118 mg/dL — ABNORMAL HIGH (ref 70–99)
Glucose-Capillary: 120 mg/dL — ABNORMAL HIGH (ref 70–99)
Glucose-Capillary: 120 mg/dL — ABNORMAL HIGH (ref 70–99)

## 2024-05-14 LAB — BASIC METABOLIC PANEL WITH GFR
Anion gap: 8 (ref 5–15)
BUN: 115 mg/dL — ABNORMAL HIGH (ref 6–20)
CO2: 30 mmol/L (ref 22–32)
Calcium: 8.5 mg/dL — ABNORMAL LOW (ref 8.9–10.3)
Chloride: 99 mmol/L (ref 98–111)
Creatinine, Ser: 2.71 mg/dL — ABNORMAL HIGH (ref 0.61–1.24)
GFR, Estimated: 28 mL/min — ABNORMAL LOW (ref >60)
Glucose, Bld: 125 mg/dL — ABNORMAL HIGH (ref 70–99)
Potassium: 5.1 mmol/L (ref 3.5–5.1)
Sodium: 137 mmol/L (ref 135–145)

## 2024-05-14 LAB — PHOSPHORUS: Phosphorus: 6.6 mg/dL — ABNORMAL HIGH (ref 2.5–4.6)

## 2024-05-14 LAB — MAGNESIUM: Magnesium: 2.3 mg/dL (ref 1.7–2.4)

## 2024-05-14 MED ORDER — ALPRAZOLAM 0.5 MG PO TABS
1.0000 mg | ORAL_TABLET | Freq: Three times a day (TID) | ORAL | Status: DC
  Administered 2024-05-14 – 2024-05-16 (×6): 1 mg
  Filled 2024-05-14 (×6): qty 2

## 2024-05-14 MED ORDER — MIDODRINE HCL 5 MG PO TABS
2.5000 mg | ORAL_TABLET | Freq: Three times a day (TID) | ORAL | Status: DC
  Administered 2024-05-14 – 2024-05-17 (×6): 2.5 mg
  Filled 2024-05-14 (×8): qty 1

## 2024-05-14 NOTE — Plan of Care (Signed)
 Progressing Add All Education: Knowledge of General Education information will improve Add Today at 0000 - Progressing by Rolfe Corean HERO, RN Add Health Behavior/Discharge Planning: Ability to manage health-related needs will improve Add Today at 0000 - Progressing by Rolfe Corean HERO, RN Add Clinical Measurements: Ability to maintain clinical measurements within normal limits will improve Add Today at 0000 - Progressing by Rolfe Corean HERO, RN Add Will remain free from infection Add Today at 0000 - Progressing by Rolfe Corean HERO, RN Add Diagnostic test results will improve Add Today at 0000 - Progressing by Rolfe Corean HERO, RN Add Respiratory complications will improve Add Today at 0000 - Progressing by Rolfe Corean HERO, RN Add Cardiovascular complication will be avoided Add Today at 0000 - Progressing by Rolfe Corean HERO, RN Add Activity: Risk for activity intolerance will decrease Add Today at 0000 - Progressing by Rolfe Corean HERO, RN

## 2024-05-14 NOTE — Plan of Care (Signed)
   Problem: Education: Goal: Knowledge of General Education information will improve Description Including pain rating scale, medication(s)/side effects and non-pharmacologic comfort measures Outcome: Progressing

## 2024-05-14 NOTE — Progress Notes (Signed)
 NAME:  [name] [name], MRN:  [id number], DOB:  09/13/1975, LOS: 30 ADMISSION DATE:  04/14/2024, CONSULTATION DATE:  04/14/2024 REFERRING MD:  Suz[facility] - [facility] EDP, CHIEF COMPLAINT:  AMS  History of Present Illness:  48 year old man who presented to [facility] ED 11/2 with AMS. PMHx significant for MSSA bacteremia, MSSA PNA, infective endocarditis, cervical through lumbar osteomyelitis, psoas abscess, severe protein calorie malnutrition/FTT, substance abuse. Recent admission 10/2 - 10/13 for sepsis, widespread MSSA.  Arrived to [facility] ED covered in feces, altered and with poor airway protection; he was subsequently intubated. CXR demonstrated RLL opacities. Confirmed bilateral septic pulmonary emboli, tricuspid valve endocarditis.  Required intubation and mechanical ventilation. Clinically progressed and was transferred out of ICU 11/10.  Reconsulted 11/13 for AMS, tachypnea and tachycardia.   Pertinent Medical History:  MSSA bacteremia MSSA PNA Lumbar osteomyelitis  Psoas abscess  Severe protein calorie malnutrition FTT in adult  Significant Hospital Events: Including procedures, antibiotic start and stop dates in addition to other pertinent events   11/2 [facility] ED w AMS. Intubated. Started on vanc mero. Txf request to [place] to [facility]  11/3 Extubated, developed respiratory distress requiring reintubation. Brief cardiac arrest during intubation.  11/3 Transthoracic echocardiogram > LVEF 40-45%, global hypokinesis, grade 1 diastolic dysfunction, normal RV function.  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  No mitral or aortic valve involvement 11/3 CT Chest, abdomen, pelvis >> numerous bilateral cavitary and solid nodules consistent with septic emboli, left lower lobe consolidation with a small pleural effusion, right basilar atelectasis.  Moderate free fluid in the pelvis.  Erosive changes at T5-T6, L2-L3, L4-L5 suggestive of discitis 11/3 CT Head >> no acute abnormality.   Opacified left maxillary sinus 11/10 Transferred out of ICU. 11/13 PCCM reconsulted for decreased mental status, tachycardia, tachypnea. Started diuretics 11/16 Poorly responsive, transferred back to ICU and intubated for the third time. Pressors started post intubation. Renal function worse. UOP poor.  Trach aspirate - Candida Trorpicals and Calcians 11/20 NO acute events overnight, awake and following commands.  Patient and spouse met with palliative care and collectively decision was made to proceed with trach and PEG if needed Blood culture - NEG BAL - normal flora 11/21 no acute events overnight, hemoglobin remained stable 11/23 pain, discomfort, muscle spasms 11/24 CCS trach, cortrak, off precedex  11/25 PSV 5/5 for 8hrs 11/26 - In better place mentally, motivated to continue to get better.  Thankful for care being provided. C/o of ongoing neck and back pain. Asking not to tell him when on PSV so he doesn't get more anxious Did ATC briefly Went for MRI but too much motion artifcat despite precedex  and intermitten fent/versed  and propofol  .  11/27   needs MRI again but concerns for anxiety and motion again. LE weaker than UE. Now on  ATC Trach 40%. Afebrilre. Vent overnight. C collar remains. RN sas stage 2 decub slowly progressing to stage 3.   Family request change over from klonopin  to xanax , started on methadone   11/28 - Placed on dilaudid  gtt last evening for ongoing pain, Currently rates 5/10. MRI - spine nnil acute Increaesd respioratiory secretions and o2 needs up  =-> started Ceftriaxone  and Flagyl   (Ancef  needs to be restarted 12/2 and continued through 12/16) Cutlure growing ABUNDANT SERRATA  on 05/12/24 11/30 Sputum with serratia. Trach collar all day. Day 4 methadone . PRN dilaudid  decreased.  12/1 - urology signed off - plan for long term foley with outpt urology f/u- change catheter  over wire every 30 days  Interim History/Subjective:  Resting, no c/o. No events overnight.    Objective:   Blood pressure 129/88, pulse 90, temperature 97.9 F (36.6 C), temperature source Oral, resp. rate 15, height 6' (1.829 m), weight 73.2 kg, SpO2 100%.    FiO2 (%):  [40 %] 40 %   Intake/Output Summary (Last 24 hours) at 05/14/2024 0740 Last data filed at 05/14/2024 0500 Gross per 24 hour  Intake 1310 ml  Output 1050 ml  Net 260 ml   Filed Weights   05/09/24 0139 05/10/24 0500 05/11/24 0500  Weight: 75.3 kg 73.2 kg 73.2 kg   Physical Examination:  General:  chronically ill appearing male, NAD  HEENT: MM pink/moist, trach, c collar  Neuro: RASS 0, sleeping but easily opens eyes to voice, follows commands  CV: s1s2 rrr, no m/r/g PULM:  resps even non labored on ATC, coarse  GI: soft, bsx4 active  Extremities: warm/dry, 2-3+ pitting edema BLE  Skin: no rashes or lesions    MRI cervical 11/11  1. Findings consistent with acute septic arthritis involving the atlantodental articulation at the skull base. Secondary abnormal widening of the atlantodental and basion-dens intervals with mild basilar invagination as above. Superimposed 2.1 cm intra-articular fluid collection at the left aspect of the atlantodental articulation. 2. No other evidence for acute infection elsewhere within the cervical spine. 3. No significant disc pathology for age. No stenosis or neural impingement.  MRI lumbar 11/27 1. Transitional lumbosacral anatomy with mostly lumbarized S1 level, same numbering used on prior Lumbar MRIs 2. Unresolved discitis/osteomyelitis at L3-L4, on the right at L5-S1, and ongoing associated right side L5-S1 septic facet joint. 3. Mild new abnormal signal at the anterior T12 inferior endplate. Difficult to exclude early New osteomyelitis at that level. Recommend attention on follow-up. 4. 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 Moderate to severe bilateral L3 and right L5 neural foraminal stenosis. 5. Extensive lumbar  paraspinal edema and myositis. Mildly regressed left lower psoas muscle abscess (estimated 4 mL now) and minimally smaller right L5-S1 posterior facet joint abscess (estimated 2 mL)  MRI thoracic 11/27 1. Unresolved T5T6 discitis-osteomyelitis. But despite endplate erosion, no significant loss of vertebral body height, spinal abscess, or other complicating features at this time. 2. No new levels of thoracic spinal infection on this non-contrast exam. 3. Abnormal lungs, in part related to septic emboli. Small layering and loculated pleural effusions   Resolved Problem List:  Postintubation hypotension Metabolic encephalopathy secondary to hypercarbia, resolved  Assessment and Plan:   MSSA with Dissemination complicated by infective endocarditis of the tricuspid valve - last echo 04/15/24, lumbar osteomyelitis, Psoas abscess, septic pulmonary emboli, C-spine septic arthritis, completed Zyvox  11/16; - 11/20 Bcxs negative - ancef  to resume 12/3 through 12/16 - On hold for CTX tx of serratia PNA as below - ID following  VAP 05/10/24 - confirmed as SERRATIA 05/12/24  - ID following, appreciate recs - ceftriaxone  through 11/28 - 05/14/24 - at some point next few to several weeks need repeat ECHO  C Spine Osteo - last seen by NSGY 04/24/24 - 11/27 MRI - - MRI lumbar and thoracic as above> unresolved but stable thoracic imaging, lumbar imaging noted regressed psoas muscle and posterior facet joint abscesses but noted new abnormal signal at anterior T12 endplate, unable to exclude new osteomyelitis - cont c-collar and LSO brace when upright for pain - will need 6 week f/u with NSGY around week of 12/22 - recs for  medical management, not a surgical candidate at that   Acute  on chronic hypoxic and hypercapnic respiratory failure secondary to MSSA pneumonia, ARDS (resolved), mucous plugging (improved)  and Septic pulmonary emboli - Tracheostomy placement 11/24 - tol ATC since 6am 12/1 - ?if  still should have vent at night - cont ATC as tol - pulmonary hygiene  - SLP following - PT/OT - intermittent CXR   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% with global hypokinesis of the left ventricle and grade 1 diastolic dysfunction 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  Qtc 417, cont to monitor intermittently - PRN   Nonoliguric AKI Urinary retention  Hyperkalemia,  - Nephrology s/o 11/21 - s/p foley placement by urology 11/16 given pendulous and bulbar urethral injuries / false pass -> removed 11/26 but ongoing retention> required Urology placement for Foley 05/10/24   P: - cont foley through d/c with catheter change over wire every 30 days and outpt f/u - cont daily lokelma , bicarb - trend renal indices/ I/Os, UOP  Chronic anemia - trend CBC periodically - transfuse per protocol   Protein calorie malnutrition -  cont EN per RD via cortrak, MVI.    Stage II pressure ulcer sacrum, present on admission - cont wound care - maximize nutrition, frequent turns  Deconditioning  Heparin  for VTE ppx PPI not warranted   DISPO - LTAC consult - Picc line placed 12/1   ATTESTATION & SIGNATURE   Critical care time 33 minutes necessary due to respiratory failure requiring mechanical ventilation.   Rockie Myers, NP Pulmonary/Critical Care Medicine  05/14/2024  7:40 AM   See Tracey for personal pager PCCM on call pager [phone] until 7pm. Please call Elink 7p-7a. [phone]

## 2024-05-14 NOTE — Progress Notes (Signed)
 Pt seen for afternoon rounds after tx out of ICU to PCU. Comfortable on ATC, good cough. BP rising a bit, will decrease midodrine . Remains drowsy but easily wakes to voice. Weaning xanax  slowly. Pt given to TRH, PCCM will continue to follow closely for trach care.    Rockie Myers, NP Pulmonary/Critical Care Medicine  05/14/2024  3:34 PM   See Amion for personal pager PCCM on call pager [phone] until 7pm. Please call Elink 7p-7a. [phone]

## 2024-05-14 NOTE — Progress Notes (Signed)
 [facility]  Date of Admission:  04/14/2024      Total days of antibiotics 30         ASSESSMENT: [name] [name] is a 48 y.o. male admitted with:   Disseminated MSSA Infection -  BSI -  Psoas Abscess, Vertebral OM C - L spine, Septic Pulmonary Emboli, TV IE) -  11/2 BCx +, cleared 11/4.  Repeated MRI done on 11/27 of T-L spine - largely stable, some evolution to anterior T12 that is likely part of natural course of severe infection unfolding on MRI. Mildly regressed LL psoas abscess and L5-S1 posterior facet joint abscess.  Will start trending CRPs to see if that's helpful with monitoring infection. No new concern for abscess.  Currently on ceftriaxone  to cover serratia marcescens pneumonia - now day 5.  - recommend to convert back to cefazolin  tomorrow - MRI results reviewed - plan for NSGY to see him back at 6 week mark 12/22 to reassess. He will likely need oral antibiotics after 8 week lead in with IV given degree of infection  - trend weekly crp   Respiratory failure -  S/P Trach 11/24 -  Serratia on Trach Aspirate 11/28 -  S/p trach placement. Currently on ceftriaxone  day 5 today. Sputum is clear and overall improved on TC. No fevers but had some low temps.  Transition back to cefazolin  tomorrow.   Substance use -  HIV and Hepatitis B sAg (-).  Hep C Ab (+) RNA (-) --> pattern c/w previously cleared / treated infection.  He is not immune to Hepatitis B - would recommend vaccination   CKD -  Lab Results  Component Value Date   CREATININE 2.71 (H) 05/14/2024  Follow and account for abx dose recommendations     PLAN: Trend weekly CRPs  Change back to cefazolin  tomorrow for long course of treatment  C-collar and LSO brace per NSGY - to re-eval at 12/22 week (6 weeks)  Anticipate he will need protracted oral antibiotics as well after 8 week induction with IV Would benefit from hepatitis B vaccine dose #1 inpatient.   Will  continue to follow intermittently.      Principal Problem:   Sepsis (HCC) Active Problems:   MSSA bacteremia   Acute respiratory failure with hypoxia and hypercapnia (HCC)   Vertebral osteomyelitis (HCC)   Severe sepsis (HCC)   Psoas abscess (HCC)   Endocarditis of tricuspid valve   Acute septic pulmonary embolism without acute cor pulmonale (HCC)    ALPRAZolam   1 mg Per Tube Q8H   Chlorhexidine  Gluconate Cloth  6 each Topical Daily   feeding supplement (PROSource TF20)  60 mL Per Tube Daily   folic acid   1 mg Per Tube Daily   gabapentin   300 mg Per Tube Q8H   guaiFENesin   5 mL Per Tube Q4H   heparin  injection (subcutaneous)  5,000 Units Subcutaneous Q8H   lidocaine   1 patch Transdermal Q24H   methadone   5 mg Per Tube Q8H   methocarbamol   750 mg Per Tube Q8H   midodrine   5 mg Per Tube TID WC   multivitamin with minerals  1 tablet Per Tube Daily   mouth rinse  15 mL Mouth Rinse 4 times per day   polyethylene glycol  17 g Per Tube BID   sennosides  10 mL Per Tube BID   sodium zirconium cyclosilicate   10 g Per Tube Daily  SUBJECTIVE: Coughing up clear sputum. Not interacting much to questions   Review of Systems: ROS  No Known Allergies  OBJECTIVE: Vitals:   05/14/24 1200 05/14/24 1300 05/14/24 1403 05/14/24 1431  BP: (!) 136/95 (!) 141/95 (!) 171/110   Pulse: 92 94 (!) 109 96  Resp: (!) 7 (!) 7 16 (!) 9  Temp:   97.7 F (36.5 C)   TempSrc:   Oral   SpO2: 100% 100% 93% 95%  Weight:      Height:       Body mass index is 21.89 kg/m.  Physical Exam HENT:     Mouth/Throat:     Mouth: Mucous membranes are moist.  Cardiovascular:     Rate and Rhythm: Normal rate.  Pulmonary:     Breath sounds: Rhonchi present.  Abdominal:     General: Bowel sounds are normal. There is no distension.     Palpations: Abdomen is soft.  Musculoskeletal:        General: Normal range of motion.  Skin:    General: Skin is warm and dry.     Capillary Refill: Capillary refill  takes less than 2 seconds.  Neurological:     Mental Status: He is alert.     Lab Results Lab Results  Component Value Date   WBC 12.2 (H) 05/14/2024   HGB 8.4 (L) 05/14/2024   HCT 28.7 (L) 05/14/2024   MCV 99.3 05/14/2024   PLT 194 05/14/2024    Lab Results  Component Value Date   CREATININE 2.71 (H) 05/14/2024   BUN 115 (H) 05/14/2024   NA 137 05/14/2024   K 5.1 05/14/2024   CL 99 05/14/2024   CO2 30 05/14/2024    Lab Results  Component Value Date   ALT <5 05/13/2024   AST 11 (L) 05/13/2024   ALKPHOS 100 05/13/2024   BILITOT 0.7 05/13/2024     Microbiology: Recent Results (from the past 240 hours)  Culture, Respiratory w Gram Stain     Status: None   Collection Time: 05/10/24 10:34 AM   Specimen: Tracheal Aspirate; Respiratory  Result Value Ref Range Status   Specimen Description TRACHEAL ASPIRATE  Final   Special Requests NONE  Final   Gram Stain   Final    MODERATE WBC PRESENT, PREDOMINANTLY PMN ABUNDANT GRAM NEGATIVE RODS RARE YEAST Performed at [facility], [address], [place], [place] [address]    Culture ABUNDANT SERRATIA MARCESCENS  Final   Report Status 05/14/2024 FINAL  Final   Organism ID, Bacteria SERRATIA MARCESCENS  Final      Susceptibility   Serratia marcescens - MIC*    CEFEPIME  <=0.12 SENSITIVE Sensitive     ERTAPENEM <=0.12 SENSITIVE Sensitive     CEFTRIAXONE  <=0.25 SENSITIVE Sensitive     CIPROFLOXACIN  0.12 SENSITIVE Sensitive     GENTAMICIN <=1 SENSITIVE Sensitive     MEROPENEM  0.5 SENSITIVE Sensitive     TRIMETH /SULFA  <=20 SENSITIVE Sensitive     * ABUNDANT SERRATIA MARCESCENS     [name], MSN, NP-C [facility]  [email or web address] Pager: [phone] Office: [phone] [facility] Main Line: [phone] *Secure Chat Communication Welcome

## 2024-05-14 NOTE — Progress Notes (Signed)
 Nutrition Follow-up  DOCUMENTATION CODES:  Severe malnutrition in context of social or environmental circumstances  INTERVENTION:  Continue TF via Cortrak: Osmolite 1.5 at 55ml/hr (1320ml per day)  60ml ProSource TF20 once daily Provides 2060 kcal, 103g protein, free water  daily   Monitor for SLP recommendations following tentative swallow study later in the week.   NUTRITION DIAGNOSIS:  Severe Malnutrition related to social / environmental circumstances (IVDU and alcohol abuse) as evidenced by severe fat depletion, severe muscle depletion. - remains applicable  GOAL:  Patient will meet greater than or equal to 90% of their needs - goal met via TF  MONITOR:  Vent status, TF tolerance, Labs  REASON FOR ASSESSMENT:  Consult, Ventilator Enteral/tube feeding initiation and management  ASSESSMENT:  Pt admitted with metabolic encephalopathy and presumed septic shock d/t metastatic MSSA infection with concern for endocarditis. PMH significant for recent admission (10/2-10/13 leaving AMA) for MSSA, PNA, psoas abscesses and lumbar osteomyelitis, tobacco use, IVDU and alcohol use  11/2: admitted, transferred from [facility] to [facility] 11/3: extubated; respiratory distress; re-intubated, cardiac arrest, ROSC; echo>LVEF 40-45%, normal RV function 11/7: cortrak placed 11/6: extubated; diet advanced to regular 11/10: transfer out of ICU 11/16: AMS, tachypnea, tachycardic; re-intubated d/t respiratory failure 11/20: s/p bronchoscopy; PMT meeting- full scope, ok with trach/PEG; CT chest -extensive atelectasis, increasing areas of cavitation, pleural effusions, erosive changes at T5-T6, L2-L3, and L4-L5 concerning for osteomyelitis/discitis 11/24: s/p trach placement; Cortrak placed  Tolerating ATC. Plan to transfer to progressive care.   SLP continues to follow for PMV placement. Recommend donning with full supervision only. Anticipate improvement and participation in instrumental swallow study  by the end of the week.  Checked in with patient at bedside.  He remains drowsy. Not very interactive today.  He is tolerating tube feeds well at this time.   Admit weight: 62.5 kg Current weight: 73.2 kg (+ mild pitting generalized, BUE, BLE edema)  Medications: folvite , MVI, miralax  BID, senna BID, sodium bicarbonate , lokelma   Labs:  BUN 115 Cr 2.71 Phosphorus 6.6 GFR 28 CBG's 102-115 x24 hours  Diet Order:   Diet Order             Diet NPO time specified  Diet effective midnight                   EDUCATION NEEDS:   Not appropriate for education at this time  Skin:  Skin Integrity Issues:: Stage I Stage I: right coccyx Stage II: Per WOC: 2 ruptured blisters 2 cm x 1 cm x 0.1 cm and 0.5 cm x 0.5 cm x 0.1 cm.  Last BM:  11/30 type 6 x2  Height:   Ht Readings from Last 1 Encounters:  04/19/24 6' (1.829 m)    Weight:   Wt Readings from Last 1 Encounters:  05/14/24 73.2 kg   BMI:  Body mass index is 21.89 kg/m.  Estimated Nutritional Needs:   Kcal:  2000-2200  Protein:  100-115g  Fluid:  >/=2L  Royce Maris, RDN, LDN Clinical Nutrition See AMiON for contact information.

## 2024-05-15 ENCOUNTER — Inpatient Hospital Stay (HOSPITAL_COMMUNITY): Payer: MEDICAID

## 2024-05-15 LAB — COMPREHENSIVE METABOLIC PANEL WITH GFR
ALT: <5 U/L (ref 0–44)
AST: <10 U/L — ABNORMAL LOW (ref 15–41)
Albumin: 1.6 g/dL — ABNORMAL LOW (ref 3.5–5.0)
Alkaline Phosphatase: 104 U/L (ref 38–126)
Anion gap: 12 (ref 5–15)
BUN: 115 mg/dL — ABNORMAL HIGH (ref 6–20)
CO2: 30 mmol/L (ref 22–32)
Calcium: 8.4 mg/dL — ABNORMAL LOW (ref 8.9–10.3)
Chloride: 97 mmol/L — ABNORMAL LOW (ref 98–111)
Creatinine, Ser: 2.74 mg/dL — ABNORMAL HIGH (ref 0.61–1.24)
GFR, Estimated: 28 mL/min — ABNORMAL LOW (ref >60)
Glucose, Bld: 115 mg/dL — ABNORMAL HIGH (ref 70–99)
Potassium: 5.9 mmol/L — ABNORMAL HIGH (ref 3.5–5.1)
Sodium: 139 mmol/L (ref 135–145)
Total Bilirubin: 0.9 mg/dL (ref 0.0–1.2)
Total Protein: 6.6 g/dL (ref 6.5–8.1)

## 2024-05-15 LAB — BLOOD GAS, VENOUS
Acid-Base Excess: 4.4 mmol/L — ABNORMAL HIGH (ref 0.0–2.0)
Bicarbonate: 33 mmol/L — ABNORMAL HIGH (ref 20.0–28.0)
O2 Saturation: 84.3 %
Patient temperature: 37
pCO2, Ven: 67 mmHg — ABNORMAL HIGH (ref 44–60)
pH, Ven: 7.3 (ref 7.25–7.43)
pO2, Ven: 49 mmHg — ABNORMAL HIGH (ref 32–45)

## 2024-05-15 LAB — CBC WITH DIFFERENTIAL/PLATELET
Abs Immature Granulocytes: 0.1 K/uL — ABNORMAL HIGH (ref 0.00–0.07)
Basophils Absolute: 0 K/uL (ref 0.0–0.1)
Basophils Relative: 0 %
Eosinophils Absolute: 0 K/uL (ref 0.0–0.5)
Eosinophils Relative: 0 %
HCT: 26 % — ABNORMAL LOW (ref 39.0–52.0)
Hemoglobin: 7.6 g/dL — ABNORMAL LOW (ref 13.0–17.0)
Immature Granulocytes: 1 %
Lymphocytes Relative: 4 %
Lymphs Abs: 0.5 K/uL — ABNORMAL LOW (ref 0.7–4.0)
MCH: 29.3 pg (ref 26.0–34.0)
MCHC: 29.2 g/dL — ABNORMAL LOW (ref 30.0–36.0)
MCV: 100.4 fL — ABNORMAL HIGH (ref 80.0–100.0)
Monocytes Absolute: 1.2 K/uL — ABNORMAL HIGH (ref 0.1–1.0)
Monocytes Relative: 9 %
Neutro Abs: 11.8 K/uL — ABNORMAL HIGH (ref 1.7–7.7)
Neutrophils Relative %: 86 %
Platelets: 191 K/uL (ref 150–400)
RBC: 2.59 MIL/uL — ABNORMAL LOW (ref 4.22–5.81)
RDW: 17.9 % — ABNORMAL HIGH (ref 11.5–15.5)
WBC: 13.7 K/uL — ABNORMAL HIGH (ref 4.0–10.5)
nRBC: 0 % (ref 0.0–0.2)

## 2024-05-15 LAB — GLUCOSE, CAPILLARY
Glucose-Capillary: 114 mg/dL — ABNORMAL HIGH (ref 70–99)
Glucose-Capillary: 111 mg/dL — ABNORMAL HIGH (ref 70–99)
Glucose-Capillary: 126 mg/dL — ABNORMAL HIGH (ref 70–99)
Glucose-Capillary: 114 mg/dL — ABNORMAL HIGH (ref 70–99)

## 2024-05-15 LAB — PHOSPHORUS: Phosphorus: 6 mg/dL — ABNORMAL HIGH (ref 2.5–4.6)

## 2024-05-15 LAB — MAGNESIUM: Magnesium: 2.5 mg/dL — ABNORMAL HIGH (ref 1.7–2.4)

## 2024-05-15 LAB — PROTIME-INR
INR: 1.3 — ABNORMAL HIGH (ref 0.8–1.2)
Prothrombin Time: 16.9 s — ABNORMAL HIGH (ref 11.4–15.2)

## 2024-05-15 LAB — PROCALCITONIN: Procalcitonin: 1.07 ng/mL

## 2024-05-15 LAB — C-REACTIVE PROTEIN: CRP: 13.1 mg/dL — ABNORMAL HIGH (ref <1.0)

## 2024-05-15 LAB — MRSA NEXT GEN BY PCR, NASAL: MRSA by PCR Next Gen: NOT DETECTED

## 2024-05-15 MED ORDER — PANTOPRAZOLE SODIUM 40 MG IV SOLR
40.0000 mg | INTRAVENOUS | Status: DC
  Administered 2024-05-15 – 2024-05-16 (×2): 40 mg via INTRAVENOUS
  Filled 2024-05-15 (×3): qty 10

## 2024-05-15 NOTE — Progress Notes (Addendum)
 PROGRESS NOTE     Patient Demographics:    [name] [name], is a 48 y.o. male, DOB - 31-Mar-1976, FMW:[id number]  Outpatient Primary MD for the patient is [name] Satterfield, MD    LOS - 31  Admit date - 04/14/2024    Chief Complaint  Patient presents with   Altered Mental Status       Brief Narrative (HPI from H&P)   49 year old man who presented to [name] ED 11/2 with AMS. PMHx significant for MSSA bacteremia, MSSA PNA, infective endocarditis, cervical through lumbar osteomyelitis, psoas abscess, severe protein calorie malnutrition/FTT, substance abuse. Recent admission 10/2 - 10/13 for sepsis, widespread MSSA.   Arrived to [name] ED covered in feces, altered and with poor airway protection; he was subsequently intubated. CXR demonstrated RLL opacities. Confirmed bilateral septic pulmonary emboli, tricuspid valve endocarditis.  Required intubation and mechanical ventilation. Clinically progressed and was transferred out of ICU 11/10.   PCCM reconsulted 11/13 for AMS, tachypnea and tachycardia stabilized in ICU and transferred to hospitalist service on 05/15/2024 on day 30 one of his [name] stay.  Currently trached, with a trach collar, c-collar in neck, NG tube, Foley catheter, extremely weak and lower extremities right leg more than left with functional paraplegia, upper extremities barely move with strength 1/5, appears to be in extremely poor condition.  Pertinent Medical History:  MSSA bacteremia MSSA PNA Lumbar osteomyelitis  Psoas abscess  Severe protein calorie malnutrition FTT in adult   Significant [name] Events: Including procedures, antibiotic start and stop dates in addition to other pertinent events   11/2 [facility] ED  w AMS. Intubated. Started on vanc mero. Txf request to [name] to [name]  11/3 Extubated, developed respiratory distress requiring reintubation. Brief cardiac arrest during intubation.  11/3 Transthoracic echocardiogram > LVEF 40-45%, global hypokinesis, grade 1 diastolic dysfunction, normal RV function.  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  No mitral or aortic valve involvement 11/3 CT Chest, abdomen, pelvis >> numerous bilateral cavitary and solid nodules consistent with septic emboli, left lower lobe consolidation with a small pleural effusion, right  basilar atelectasis.  Moderate free fluid in the pelvis.  Erosive changes at T5-T6, L2-L3, L4-L5 suggestive of discitis 11/3 CT Head >> no acute abnormality.  Opacified left maxillary sinus 11/10 Transferred out of ICU. 11/13 PCCM reconsulted for decreased mental status, tachycardia, tachypnea. Started diuretics 11/16 Poorly responsive, transferred back to ICU and intubated for the third time. Pressors started post intubation. Renal function worse. UOP poor.  Trach aspirate - Candida Trorpicals and Calcians 11/20 NO acute events overnight, awake and following commands.  Patient and spouse met with palliative care and collectively decision was made to proceed with trach and PEG if needed Blood culture - NEG BAL - normal flora 11/21 no acute events overnight, hemoglobin remained stable 11/23 pain, discomfort, muscle spasms 11/24 CCS trach, cortrak, off precedex  11/25 PSV 5/5 for 8hrs 11/26 - In better place mentally, motivated to continue to get better.  Thankful for care being provided. C/o of ongoing neck and back pain. Asking not to tell him when on PSV so he doesn't get more anxious Did ATC briefly Went for MRI but too much motion artifcat despite precedex  and intermitten fent/versed  and propofol  .  11/27   needs MRI again but concerns for anxiety and motion again. LE weaker than UE. Now on  ATC Trach 40%.  Afebrilre. Vent overnight. C collar remains. RN sas stage 2 decub slowly progressing to stage 3.   Family request change over from klonopin  to xanax , started on methadone   11/28 - Placed on dilaudid  gtt last evening for ongoing pain, Currently rates 5/10. MRI - spine nnil acute Increaesd respioratiory secretions and o2 needs up  =-> started Ceftriaxone  and Flagyl  (Ancef  needs to be restarted 12/2 and continued through 12/16) Cutlure growing ABUNDANT SERRATA  on 05/12/24 11/30 Sputum with serratia. Trach collar all day. Day 4 methadone . PRN dilaudid  decreased.  12/1 - urology signed off - plan for long term foley with outpt urology f/u- change catheter over wire every 30 days 05/15/2024 transferred to [facility].   Subjective:    [name] today in bed, nonverbal, has a trach collar, c-collar, NG tube, leaning towards his right side, says no to any headache, agrees that he is weak all over   Assessment  & Plan :   MSSA with Dissemination complicated by infective endocarditis of the tricuspid valve - last echo 04/15/24, lumbar osteomyelitis, Psoas abscess, septic pulmonary emboli, C-spine septic arthritis, had multiple courses of antibiotics in the past. ID following currently on cefazolin  stop date 06/01/2024 may need PO after that, will defer to ID.   VAP 05/10/24 - confirmed as SERRATIA 05/12/24  - ID following, appreciate recs as above - Will repeat an echocardiogram 06/01/2024.   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, functional paraplegia, and minimal leg but extremity strength, urinary retention, dysphagia requiring NG tube, failure to protect airway requiring tracheostomy and c-collar  - last seen by NSGY 04/24/24 - 11/27 MRI - - MRI lumbar and thoracic as above> unresolved but stable thoracic imaging, lumbar imaging noted regressed psoas muscle and posterior facet joint abscesses but noted new abnormal signal at anterior T12 endplate, unable to exclude new  osteomyelitis - cont c-collar and LSO brace when upright for pain, antibiotics per ID - will need 6 week f/u with NSGY around week of 12/22 - recs for medical management, not a surgical candidate at that    Acute  on chronic hypoxic and hypercapnic respiratory failure secondary to MSSA pneumonia, ARDS (resolved), mucous plugging (improved)  and Septic pulmonary emboli - Tracheostomy placement 11/24 - tol ATC since 6am 12/1 - ?if still should have vent at night - cont ATC as tol - pulmonary hygiene  - SLP following - PT/OT - intermittent CXR    Dysphagia due to above.  On NG tube, speech following, may require peg tube eventually.    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  Qtc 417, cont to monitor intermittently - PRN    Nonoliguric AKI Urinary retention  Hyperkalemia,  - Nephrology s/o 11/21 - s/p foley placement by urology 11/16 given pendulous and bulbar urethral injuries / false pass -> removed 11/26 but ongoing retention> required Urology placement for Foley 05/10/24    P: - cont foley through d/c with catheter change over wire every 30 days and outpt f/u - cont daily lokelma , bicarb - trend renal indices/ I/Os, UOP   Chronic anemia - trend CBC periodically - transfuse per protocol    Protein calorie malnutrition -  cont EN per RD via cortrak, MVI.     Stage II pressure ulcer sacrum, present on admission - cont wound care - maximize nutrition, frequent turns   Deconditioning   Heparin  for VTE ppx PPI    DISPO - LTAC consult - Picc line placed  12/1         Condition - Extremely Guarded  Family Communication  :  wife updated [phone] on 05/15/24  Code Status :  Full  Consults  :  PCCM, NS, ID  PUD Prophylaxis :  PPI   Procedures  :            Disposition Plan  :    Status is: Inpatient   DVT Prophylaxis  :    Place TED hose Start: 05/08/24 0813 heparin  injection 5,000 Units Start: 05/02/24 1400 SCDs Start: 04/14/24 2038   Lab Results  Component Value Date   PLT 194 05/14/2024    Diet :  Diet Order             Diet NPO time specified  Diet effective midnight                    Inpatient Medications  Scheduled Meds:  ALPRAZolam   1 mg Per Tube Q8H   Chlorhexidine  Gluconate Cloth  6 each Topical Daily   feeding supplement (PROSource TF20)  60 mL Per Tube Daily   folic acid   1 mg Per Tube Daily   gabapentin   300 mg Per Tube Q8H   guaiFENesin   5 mL Per Tube Q4H   heparin  injection (subcutaneous)  5,000 Units Subcutaneous Q8H   lidocaine   1 patch Transdermal Q24H   methadone   5 mg Per Tube Q8H   methocarbamol   750 mg Per Tube Q8H   midodrine   2.5 mg Per Tube TID WC   multivitamin with minerals  1 tablet Per Tube Daily   mouth rinse  15 mL Mouth Rinse 4 times per day   polyethylene glycol  17 g Per Tube BID   sennosides  10 mL Per Tube BID   sodium  zirconium cyclosilicate  10 g Per Tube Daily   Continuous Infusions:   ceFAZolin  (ANCEF ) IV     feeding supplement (OSMOLITE 1.5 CAL) 1,000 mL (05/14/24 2318)   PRN Meds:.acetaminophen , HYDROmorphone  (DILAUDID ) injection, ipratropium-albuterol , midazolam  PF, ondansetron  (ZOFRAN ) IV, mouth rinse, sodium chloride  flush    Objective:   Vitals:   05/15/24 0000 05/15/24 0500 05/15/24 0752 05/15/24 0815  BP: (!) 138/92  119/83   Pulse: (!) 111  (!) 105 (!) 109  Resp: 14  (!) 7 (!) 9  Temp: (!) 97.5 F (36.4 C)  99.2 F (37.3 C)   TempSrc: Axillary  Axillary   SpO2: 93%  96% 97%  Weight:  90.3 kg    Height:        Wt Readings from  Last 3 Encounters:  05/15/24 90.3 kg  03/15/24 62.5 kg  11/09/16 74.5 kg     Intake/Output Summary (Last 24 hours) at 05/15/2024 1008 Last data filed at 05/15/2024 0753 Gross per 24 hour  Intake 210 ml  Output 1600 ml  Net -1390 ml     Physical Exam  Awake , upper extremities 2/5, R leg 1/5, L leg 2/5, Trach collar, C Collar, Foley, R Arm PICC, NG tube [place].AT,PERRAL Supple Neck, No JVD,   Symmetrical Chest wall movement, Good air movement bilaterally, CTAB RRR,No Gallops,Rubs or new Murmurs,  +ve B.Sounds, Abd Soft, No tenderness,   No Cyanosis, Clubbing or edema     RN pressure injury documentation: Wound 03/15/24 0130 Pressure Injury Sacrum Medial Stage 3 -  Full thickness tissue loss. Subcutaneous fat may be visible but bone, tendon or muscle are NOT exposed. (Active)      Data Review:    Recent Labs  Lab 05/09/24 0751 05/10/24 0230 05/11/24 0511 05/13/24 0742 05/14/24 0525  WBC 10.4 9.3 12.4* 13.3* 12.2*  HGB 9.3* 8.1* 7.9* 7.7* 8.4*  HCT 30.2* 27.1* 26.8* 26.1* 28.7*  PLT 169 160 165 189 194  MCV 94.4 97.1 96.8 98.9 99.3  MCH 29.1 29.0 28.5 29.2 29.1  MCHC 30.8 29.9* 29.5* 29.5* 29.3*  RDW 18.1* 18.3* 18.3* 18.2* 18.0*    Recent Labs  Lab 05/09/24 0619 05/09/24 2001 05/10/24 0230 05/11/24 0511 05/13/24 0742 05/14/24 0525 05/15/24 0237  NA 134* 135 137 138 137 137  --   K 5.7* 4.3 4.7 4.8 4.8 5.1  --   CL 98 97* 101 100 98 99  --   CO2 18* 26 26 27 29 30   --   ANIONGAP 18* 12 10 11 10 8   --   GLUCOSE 110* 100* 111* 117* 113* 125*  --   BUN 106* 106* 105* 105* 113* 115*  --   CREATININE 2.71* 2.51* 2.68* 2.68* 2.73* 2.71*  --   AST  --   --  10*  --  11*  --   --   ALT  --   --  <5  --  <5  --   --   ALKPHOS  --   --  85  --  100  --   --   BILITOT  --   --  0.8  --  0.7  --   --   ALBUMIN   --   --  1.5*  --  1.5*  --   --   CRP  --   --   --   --   --   --  13.1*  MG 2.1  --  2.1  --  2.2 2.3  --  PHOS 6.3*  --  6.0*  --  6.1* 6.6*  --    CALCIUM  8.0* 8.3* 8.1* 8.2* 8.4* 8.5*  --       Recent Labs  Lab 05/09/24 0619 05/09/24 2001 05/10/24 0230 05/11/24 0511 05/13/24 0742 05/14/24 0525 05/15/24 0237  CRP  --   --   --   --   --   --  13.1*  MG 2.1  --  2.1  --  2.2 2.3  --   CALCIUM  8.0* 8.3* 8.1* 8.2* 8.4* 8.5*  --     --------------------------------------------------------------------------------------------------------------- Lab Results  Component Value Date   TRIG 101 05/05/2024    Lab Results  Component Value Date   HGBA1C 4.6 (L) 04/25/2024   No results for input(s): TSH, T4TOTAL, FREET4, T3FREE, THYROIDAB in the last 72 hours. No results for input(s): VITAMINB12, FOLATE, FERRITIN, TIBC, IRON , RETICCTPCT in the last 72 hours. ------------------------------------------------------------------------------------------------------------------ Cardiac Enzymes No results for input(s): CKMB, TROPONINI, MYOGLOBIN in the last 168 hours.  Invalid input(s): CK  Micro Results Recent Results (from the past 240 hours)  Culture, Respiratory w Gram Stain     Status: None   Collection Time: 05/10/24 10:34 AM   Specimen: Tracheal Aspirate; Respiratory  Result Value Ref Range Status   Specimen Description TRACHEAL ASPIRATE  Final   Special Requests NONE  Final   Gram Stain   Final    MODERATE WBC PRESENT, PREDOMINANTLY PMN ABUNDANT GRAM NEGATIVE RODS RARE YEAST Performed at [facility], [address] [place], [place] [address]    Culture ABUNDANT SERRATIA MARCESCENS  Final   Report Status 05/14/2024 FINAL  Final   Organism ID, Bacteria SERRATIA MARCESCENS  Final      Susceptibility   Serratia marcescens - MIC*    CEFEPIME  <=0.12 SENSITIVE Sensitive     ERTAPENEM <=0.12 SENSITIVE Sensitive     CEFTRIAXONE  <=0.25 SENSITIVE Sensitive     CIPROFLOXACIN  0.12 SENSITIVE Sensitive     GENTAMICIN <=1 SENSITIVE Sensitive     MEROPENEM  0.5 SENSITIVE Sensitive      TRIMETH /SULFA  <=20 SENSITIVE Sensitive     * ABUNDANT SERRATIA MARCESCENS    Radiology Report DG Chest Port 1 View Result Date: 05/15/2024 EXAM: 1 VIEW(S) XRAY OF THE CHEST 05/15/2024 07:13:00 AM COMPARISON: 05/12/2024 CLINICAL HISTORY: SOB (shortness of breath) FINDINGS: LINES, TUBES AND DEVICES: Tracheostomy tube in place. Enteric tube courses below diaphragm with distal tip beyond inferior margin of film. Right upper extremity PICC with tip at superior cavoatrial junction. LUNGS AND PLEURA: Increased patchy airspace opacity in left mid to lower lung zone. Increased right basilar airspace opacity. Trace right pleural effusion. No pneumothorax. HEART AND MEDIASTINUM: No acute abnormality of the cardiac and mediastinal silhouettes. BONES AND SOFT TISSUES: No acute osseous abnormality. IMPRESSION: 1. Increased patchy airspace opacity in the left mid to lower lung zone and increased right basilar airspace opacity, compatible with multifocal airspace disease. 2. Trace right pleural effusion. Electronically signed by: Waddell Calk MD 05/15/2024 07:20 AM EST RP Workstation: [id number]     Signature  -   Lavada Stank M.D on 05/15/2024 at 10:08 AM   -  To page go to [email or web address]

## 2024-05-15 NOTE — Progress Notes (Signed)
 eLink Physician-Brief Progress Note Patient Name: [name] [name] DOB: 10-Feb-1976 MRN: [id number]   Date of Service  05/15/2024  HPI/Events of Note  Transferred out of ICU, notified about desaturation into the 80s range slowly improved with deep suctioning and escalation of supplemental oxygen to 50% trach collar.  eICU Interventions  Patient is notably bradycardia apneic on alprazolam  3 times daily.  Bradypnea may be contributing to some degree of hypoventilation.  Will check gas with a.m. labs.     Intervention Category Minor Interventions: Routine modifications to care plan (e.g. PRN medications for pain, fever)  Aquarius Tremper 05/15/2024, 1:43 AM

## 2024-05-15 NOTE — Progress Notes (Signed)
 Physical Therapy Treatment Patient Details Name: [name] [name] MRN: [id number] DOB: 1976/03/11 Today's Date: 05/15/2024   History of Present Illness 48 yo M recently admitted with MSSA PNA MSSA bacteremia psoas abscesses and lumbar osteo 03/14/24-03/25/24 (left AMA) who presented to [facility] ED 04/14/24 w AMS. Admitted with sepsis and transferred to [facility]. Arrived to ED covered in feces, altered, with poor airway protection and was intubated, Had PEA cardiac arrest on 11/3 with 4 mins CPR extubated on 11/9. 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 PMH: includes: anxiety, HTN    PT Comments  Pt more awake today but remains dependent with all aspects of mobility and care. Followed some 1 step commands and has some movement in BUE's. No movement in BLE's except trace of lt foot dorsiflexion one time.     If plan is discharge home, recommend the following: Two people to help with walking and/or transfers;Two people to help with bathing/dressing/bathroom   Can travel by private vehicle     No  Equipment Recommendations  None recommended by PT    Recommendations for Other Services       Precautions / Restrictions Precautions Precautions: Fall;Cervical;Back Recall of Precautions/Restrictions: Impaired Precaution/Restrictions Comments: Cortrak, trach Required Braces or Orthoses: Cervical Brace;Spinal Brace Cervical Brace: Hard collar (Mi[place] J) Spinal Brace: Lumbar corset (for OOB) Restrictions Weight Bearing Restrictions Per Provider Order: No     Mobility  Bed Mobility Overal bed mobility: Needs Assistance             General bed mobility comments: Placed bed in chair position with HOB 57 degrees    Transfers                        Ambulation/Gait                   Stairs              Wheelchair Mobility     Tilt Bed    Modified Rankin (Stroke Patients Only)       Balance                                            Communication Communication Communication: Impaired Factors Affecting Communication: Trach/intubated  Cognition Arousal: Alert Behavior During Therapy: Flat affect   PT - Cognitive impairments: Difficult to assess Difficult to assess due to: Tracheostomy                       Following commands: Impaired Following commands impaired: Follows one step commands inconsistently, Follows one step commands with increased time    Cueing Cueing Techniques: Verbal cues, Tactile cues, Gestural cues  Exercises General Exercises - Upper Extremity Shoulder Flexion: PROM, Both, Supine Elbow Flexion: AAROM, Both, Supine Elbow Extension: PROM, Both, Supine General Exercises - Lower Extremity Ankle Circles/Pumps: Both, Supine, PROM (increased cuing) Heel Slides: Both, Supine, PROM    General Comments        Pertinent Vitals/Pain Pain Assessment Pain Assessment: Faces Faces Pain Scale: Hurts little more Pain Location: generalized, neck Pain Descriptors / Indicators: Grimacing, Discomfort Pain Intervention(s): Limited activity within patient's tolerance, Monitored during session, Repositioned    Home Living  Prior Function            PT Goals (current goals can now be found in the care plan section) Acute Rehab PT Goals Patient Stated Goal: pt unable to state Progress towards PT goals: Not progressing toward goals - comment    Frequency    Min 2X/week      PT Plan      Co-evaluation              AM-PAC PT 6 Clicks Mobility   Outcome Measure  Help needed turning from your back to your side while in a flat bed without using bedrails?: Total Help needed moving from lying on your back to sitting on the side of a flat bed without using bedrails?: Total Help  needed moving to and from a bed to a chair (including a wheelchair)?: Total Help needed standing up from a chair using your arms (e.g., wheelchair or bedside chair)?: Total Help needed to walk in hospital room?: Total Help needed climbing 3-5 steps with a railing? : Total 6 Click Score: 6    End of Session Equipment Utilized During Treatment: Oxygen Activity Tolerance: Patient limited by fatigue Patient left: in bed;with call bell/phone within reach;with nursing/sitter in room;with bed alarm set Nurse Communication: Mobility status;Need for lift equipment PT Visit Diagnosis: Muscle weakness (generalized) (M62.81);Other abnormalities of gait and mobility (R26.89);Pain Pain - part of body:  (back and neck)     Time: 8486-8458 PT Time Calculation (min) (ACUTE ONLY): 28 min  Charges:    $Therapeutic Activity: 23-37 mins PT General Charges $$ ACUTE PT VISIT: 1 Visit                     [facility] PT Acute Rehabilitation Services Office [phone]    [place] ORN [facility] 05/15/2024, 4:35 PM

## 2024-05-15 NOTE — Plan of Care (Signed)

## 2024-05-15 NOTE — Plan of Care (Signed)
   Problem: Education: Goal: Knowledge of General Education information will improve Description Including pain rating scale, medication(s)/side effects and non-pharmacologic comfort measures Outcome: Progressing

## 2024-05-16 ENCOUNTER — Inpatient Hospital Stay (HOSPITAL_COMMUNITY): Payer: MEDICAID

## 2024-05-16 LAB — CBC WITH DIFFERENTIAL/PLATELET
Abs Immature Granulocytes: 0.19 K/uL — ABNORMAL HIGH (ref 0.00–0.07)
Basophils Absolute: 0 K/uL (ref 0.0–0.1)
Basophils Relative: 0 %
Eosinophils Absolute: 0 K/uL (ref 0.0–0.5)
Eosinophils Relative: 0 %
HCT: 27.7 % — ABNORMAL LOW (ref 39.0–52.0)
Hemoglobin: 7.8 g/dL — ABNORMAL LOW (ref 13.0–17.0)
Immature Granulocytes: 2 %
Lymphocytes Relative: 4 %
Lymphs Abs: 0.5 K/uL — ABNORMAL LOW (ref 0.7–4.0)
MCH: 29 pg (ref 26.0–34.0)
MCHC: 28.2 g/dL — ABNORMAL LOW (ref 30.0–36.0)
MCV: 103 fL — ABNORMAL HIGH (ref 80.0–100.0)
Monocytes Absolute: 1.4 K/uL — ABNORMAL HIGH (ref 0.1–1.0)
Monocytes Relative: 10 %
Neutro Abs: 10.8 K/uL — ABNORMAL HIGH (ref 1.7–7.7)
Neutrophils Relative %: 84 %
Platelets: 189 K/uL (ref 150–400)
RBC: 2.69 MIL/uL — ABNORMAL LOW (ref 4.22–5.81)
RDW: 17.6 % — ABNORMAL HIGH (ref 11.5–15.5)
WBC: 12.9 K/uL — ABNORMAL HIGH (ref 4.0–10.5)
nRBC: 0 % (ref 0.0–0.2)

## 2024-05-16 LAB — BLOOD GAS, ARTERIAL
Acid-Base Excess: 3.5 mmol/L — ABNORMAL HIGH (ref 0.0–2.0)
Acid-Base Excess: 2.7 mmol/L — ABNORMAL HIGH (ref 0.0–2.0)
Bicarbonate: 32.6 mmol/L — ABNORMAL HIGH (ref 20.0–28.0)
Bicarbonate: 31.9 mmol/L — ABNORMAL HIGH (ref 20.0–28.0)
Drawn by: 336832
Drawn by: 244901
O2 Saturation: 98.8 %
O2 Saturation: 99.7 %
Patient temperature: 36.3
Patient temperature: 36.4
pCO2 arterial: 69 mmHg (ref 32–48)
pCO2 arterial: 69 mmHg (ref 32–48)
pH, Arterial: 7.28 — ABNORMAL LOW (ref 7.35–7.45)
pH, Arterial: 7.27 — ABNORMAL LOW (ref 7.35–7.45)
pO2, Arterial: 78 mmHg — ABNORMAL LOW (ref 83–108)
pO2, Arterial: 128 mmHg — ABNORMAL HIGH (ref 83–108)

## 2024-05-16 LAB — COMPREHENSIVE METABOLIC PANEL WITH GFR
ALT: <5 U/L (ref 0–44)
AST: <10 U/L — ABNORMAL LOW (ref 15–41)
Albumin: 1.6 g/dL — ABNORMAL LOW (ref 3.5–5.0)
Alkaline Phosphatase: 111 U/L (ref 38–126)
Anion gap: 8 (ref 5–15)
BUN: 118 mg/dL — ABNORMAL HIGH (ref 6–20)
CO2: 31 mmol/L (ref 22–32)
Calcium: 8.3 mg/dL — ABNORMAL LOW (ref 8.9–10.3)
Chloride: 99 mmol/L (ref 98–111)
Creatinine, Ser: 2.79 mg/dL — ABNORMAL HIGH (ref 0.61–1.24)
GFR, Estimated: 27 mL/min — ABNORMAL LOW (ref >60)
Glucose, Bld: 129 mg/dL — ABNORMAL HIGH (ref 70–99)
Potassium: 5.7 mmol/L — ABNORMAL HIGH (ref 3.5–5.1)
Sodium: 138 mmol/L (ref 135–145)
Total Bilirubin: 0.9 mg/dL (ref 0.0–1.2)
Total Protein: 6.7 g/dL (ref 6.5–8.1)

## 2024-05-16 LAB — URINALYSIS, ROUTINE W REFLEX MICROSCOPIC

## 2024-05-16 LAB — GLUCOSE, CAPILLARY
Glucose-Capillary: 122 mg/dL — ABNORMAL HIGH (ref 70–99)
Glucose-Capillary: 104 mg/dL — ABNORMAL HIGH (ref 70–99)
Glucose-Capillary: 109 mg/dL — ABNORMAL HIGH (ref 70–99)
Glucose-Capillary: 112 mg/dL — ABNORMAL HIGH (ref 70–99)
Glucose-Capillary: 111 mg/dL — ABNORMAL HIGH (ref 70–99)

## 2024-05-16 LAB — URINALYSIS, MICROSCOPIC (REFLEX): RBC / HPF: >50 RBC/hpf (ref 0–5)

## 2024-05-16 LAB — C-REACTIVE PROTEIN: CRP: 15.1 mg/dL — ABNORMAL HIGH (ref <1.0)

## 2024-05-16 LAB — PHOSPHORUS: Phosphorus: 6.8 mg/dL — ABNORMAL HIGH (ref 2.5–4.6)

## 2024-05-16 LAB — PROTEIN / CREATININE RATIO, URINE
Creatinine, Urine: 34 mg/dL
Protein Creatinine Ratio: 7.26 mg/mg{creat} — ABNORMAL HIGH (ref 0.00–0.15)
Total Protein, Urine: 247 mg/dL

## 2024-05-16 LAB — MAGNESIUM: Magnesium: 2.5 mg/dL — ABNORMAL HIGH (ref 1.7–2.4)

## 2024-05-16 LAB — PROCALCITONIN: Procalcitonin: 0.96 ng/mL

## 2024-05-16 LAB — LACTIC ACID, PLASMA: Lactic Acid, Venous: 0.8 mmol/L (ref 0.5–1.9)

## 2024-05-16 MED ORDER — NEPRO/CARBSTEADY PO LIQD
1000.0000 mL | ORAL | Status: DC
  Administered 2024-05-16 – 2024-05-22 (×6): 1000 mL
  Filled 2024-05-16 (×3): qty 1000

## 2024-05-16 MED ORDER — SODIUM ZIRCONIUM CYCLOSILICATE 10 G PO PACK
10.0000 g | PACK | Freq: Once | ORAL | Status: AC
  Administered 2024-05-16: 10 g
  Filled 2024-05-16: qty 1

## 2024-05-16 MED ORDER — FREE WATER
100.0000 mL | Freq: Three times a day (TID) | Status: DC
  Administered 2024-05-16 – 2024-05-21 (×15): 100 mL

## 2024-05-16 MED ORDER — ALPRAZOLAM 0.5 MG PO TABS
0.5000 mg | ORAL_TABLET | Freq: Two times a day (BID) | ORAL | Status: DC
  Administered 2024-05-17 – 2024-05-18 (×4): 0.5 mg
  Filled 2024-05-16 (×4): qty 1

## 2024-05-16 MED ORDER — ORAL CARE MOUTH RINSE: 15.0000 mL | OROMUCOSAL | Status: DC | PRN

## 2024-05-16 MED ORDER — SODIUM ZIRCONIUM CYCLOSILICATE 10 G PO PACK
10.0000 g | PACK | Freq: Two times a day (BID) | ORAL | Status: DC
  Administered 2024-05-16 – 2024-05-19 (×7): 10 g
  Filled 2024-05-16 (×9): qty 1

## 2024-05-16 MED ORDER — ORAL CARE MOUTH RINSE
15.0000 mL | OROMUCOSAL | Status: DC
  Administered 2024-05-16 – 2024-05-17 (×8): 15 mL via OROMUCOSAL

## 2024-05-16 MED ORDER — METHADONE HCL 10 MG PO TABS
5.0000 mg | ORAL_TABLET | Freq: Every day | ORAL | Status: DC
  Administered 2024-05-17 – 2024-05-18 (×2): 5 mg
  Filled 2024-05-16 (×2): qty 1

## 2024-05-16 MED ORDER — FAMOTIDINE 20 MG PO TABS: 20.0000 mg | ORAL_TABLET | Freq: Two times a day (BID) | ORAL | Status: DC

## 2024-05-16 NOTE — Progress Notes (Signed)
 Abg reviewed CXR about the same. I remain concerned that this could be a ventilatory issue from his C spine infxn Plan Back to ICU Place on vent May end up needing at least nocturnal ventilation for some time still

## 2024-05-16 NOTE — Progress Notes (Signed)
 Report given to [place] of 62M.

## 2024-05-16 NOTE — Progress Notes (Signed)
 NAME:  [name] [name], MRN:  [id number], DOB:  Jan 06, 1976, LOS: 32 ADMISSION DATE:  04/14/2024, CONSULTATION DATE:  04/14/2024 REFERRING MD:  [place] - [facility] EDP, CHIEF COMPLAINT:  AMS  History of Present Illness:  48 year old man who presented to [facility] ED 11/2 with AMS. PMHx significant for MSSA bacteremia, MSSA PNA, infective endocarditis, cervical through lumbar osteomyelitis, psoas abscess, severe protein calorie malnutrition/FTT, substance abuse. Recent admission 10/2 - 10/13 for sepsis, widespread MSSA.  Arrived to [facility] ED covered in feces, altered and with poor airway protection; he was subsequently intubated. CXR demonstrated RLL opacities. Confirmed bilateral septic pulmonary emboli, tricuspid valve endocarditis.  Required intubation and mechanical ventilation. Clinically progressed and was transferred out of ICU 11/10.  Reconsulted 11/13 for AMS, tachypnea and tachycardia.   Pertinent Medical History:  MSSA bacteremia MSSA PNA Lumbar osteomyelitis  Psoas abscess  Severe protein calorie malnutrition FTT in adult  Significant Hospital Events: Including procedures, antibiotic start and stop dates in addition to other pertinent events   11/2 [facility] ED w AMS. Intubated. Started on vanc mero. Txf request to [place] to [facility]  11/3 Extubated, developed respiratory distress requiring reintubation. Brief cardiac arrest during intubation.  11/3 Transthoracic echocardiogram > LVEF 40-45%, global hypokinesis, grade 1 diastolic dysfunction, normal RV function.  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  No mitral or aortic valve involvement 11/3 CT Chest, abdomen, pelvis >> numerous bilateral cavitary and solid nodules consistent with septic emboli, left lower lobe consolidation with a small pleural effusion, right basilar atelectasis.  Moderate free fluid in the pelvis.  Erosive changes at T5-T6, L2-L3, L4-L5 suggestive of discitis 11/3 CT Head >> no acute abnormality.   Opacified left maxillary sinus 11/10 Transferred out of ICU. 11/13 PCCM reconsulted for decreased mental status, tachycardia, tachypnea. Started diuretics 11/16 Poorly responsive, transferred back to ICU and intubated for the third time. Pressors started post intubation. Renal function worse. UOP poor.  Trach aspirate - Candida Trorpicals and Calcians 11/20 NO acute events overnight, awake and following commands.  Patient and spouse met with palliative care and collectively decision was made to proceed with trach and PEG if needed Blood culture - NEG BAL - normal flora 11/21 no acute events overnight, hemoglobin remained stable 11/23 pain, discomfort, muscle spasms 11/24 CCS trach, cortrak, off precedex  11/25 PSV 5/5 for 8hrs 11/26 - In better place mentally, motivated to continue to get better.  Thankful for care being provided. C/o of ongoing neck and back pain. Asking not to tell him when on PSV so he doesn't get more anxious Did ATC briefly Went for MRI but too much motion artifcat despite precedex  and intermitten fent/versed  and propofol  .  11/27   needs MRI again but concerns for anxiety and motion again. LE weaker than UE. Now on  ATC Trach 40%. Afebrilre. Vent overnight. C collar remains. RN sas stage 2 decub slowly progressing to stage 3.   Family request change over from klonopin  to xanax , started on methadone   11/28 - Placed on dilaudid  gtt last evening for ongoing pain, Currently rates 5/10. MRI - spine nnil acute Increaesd respioratiory secretions and o2 needs up  =-> started Ceftriaxone  and Flagyl   (Ancef  needs to be restarted 12/2 and continued through 12/16) Cutlure growing ABUNDANT SERRATA  on 05/12/24 11/30 Sputum with serratia. Trach collar all day. Day 4 methadone . PRN dilaudid  decreased.  12/1 - urology signed off - plan for long term foley with outpt urology f/u- change catheter  over wire every 30 days 12/4 PCCM asked to see as pt more difficult to wake up   Interim  History/Subjective:  More obtunded. Pulm called to see   Objective:   Blood pressure (!) 135/98, pulse (!) 106, temperature (!) 97.4 F (36.3 C), temperature source Axillary, resp. rate (!) 5, height 6' (1.829 m), weight 92.3 kg, SpO2 94%.    FiO2 (%):  [35 %-40 %] 35 %   Intake/Output Summary (Last 24 hours) at 05/16/2024 1245 Last data filed at 05/16/2024 0836 Gross per 24 hour  Intake 1775 ml  Output 1800 ml  Net -25 ml   Filed Weights   05/14/24 0710 05/15/24 0500 05/16/24 0500  Weight: 73.2 kg 90.3 kg 92.3 kg   Physical Examination: General This is a chronically ill 48 year old male patient who is status post an acute and prolonged critical illness.  He is currently lying in bed, his eyes are open but he is not following commands nor interacting HEENT c-collar in place.  Size 6 tracheostomy is midline, cuff is deflated, minimal tracheal output currently Pulmonary coarse scattered rhonchi, does have mild accessory use.  Inspiratory efforts appear labored and asymmetric.  Currently on 35% aerosol trach collar Cardiac tachycardic rhythm Abdomen soft PEG in place Neuro eyes open. Not following commands. Gross motor to uppers.     Resolved Problem List:  Postintubation hypotension Metabolic encephalopathy secondary to hypercarbia, resolved Acute  on chronic hypoxic and hypercapnic respiratory failure secondary to MSSA  ARDS (resolved), mucous plugging (improved)  and Septic pulmonary emboli  Nonoliguric AK Assessment and Plan:   Tracheostomy dependence  MSSA with Dissemination complicated by infective endocarditis of the tricuspid valve - last echo 04/15/24, lumbar osteomyelitis, Psoas abscess, septic pulmonary emboli, C-spine septic arthritis, VAP 05/10/24 - confirmed as SERRATIA 05/12/24 C Spine Osteo  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% with global hypokinesis of the left ventricle and grade 1 diastolic dysfunction History of substance abuse and chronic anxiety Urinary retention now  has FC Chronic anemia Protein calorie malnutrition Stage II pressure ulcer sacrum, present on admission Deconditioning   Pulm problem list  Acute metabolic encephalopathy secondary to hypercarbia, but also superimposed on underlying anxiety, substance abuse, and chronic pain Plan Agree w/ backing down on sedating meds Supportive care   Acute on chronic Hypercarbic respiratory failure.  pCO2 currently 69.  His cough mechanics are poor, he also has functional paraplegia, and currently is having some increased accessory muscle use.    Certainly sedating medication regimen could be contributing to this however I also worry about his underlying respiratory reserves,and diaphragmatic fxn Plan Starting with reduction of sedating medications, already done by primary team Repeat arterial blood gas later this afternoon Checking lactic acid Checking portable chest x-ray I am a little concerned about his pulmonary endurance, and whether or not he's got enough diaphragmatic support to stay off vent   Tracheostomy dependence:secondary to ineffective cough mechanics due to C-spine septic arthritis, and functional paraplegia Now status post prolonged critical illness and ventilator dependent respiratory failure due to ineffective cough, mucous plugging, MSSA pneumonia, and ARDS.  Complicated by Serratia hospital associated pneumonia. -Tracheostomy was placed on 11/24 -Still has poor respiratory mechanics clinically, and not a candidate for decannulation Plan Cont routine trach care Hold off from changing to cuffless for now in case we     I personally  spent 32 minutes  on this patient which included: review of medical records, nursing notes, progress notes, evaluation, interpretation of lab data and  diagnostic studies, taking independent history, performing exam, documenting plan, ordering diagnostics and interventions for the following critical care issues: Acute respiratory failure with the  following interventions which included: titration of ventilatory support, prevention of further deterioration

## 2024-05-16 NOTE — Progress Notes (Signed)
 Occupational Therapy Treatment Patient Details Name: [name] [name] MRN: [id number] DOB: February 13, 1976 Today's Date: 05/16/2024   History of present illness 48 yo M recently admitted with MSSA PNA MSSA bacteremia psoas abscesses and lumbar osteo 03/14/24-03/25/24 (left AMA) who presented to [facility] ED 04/14/24 w AMS. Admitted with sepsis and transferred to [facility]. Arrived to ED covered in feces, altered, with poor airway protection and was intubated, Had PEA cardiac arrest on 11/3 with 4 mins CPR extubated on 11/9. 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 PMH: includes: anxiety, HTN   OT comments  Patient with no attempts at any participation with OT session.  Facial grimacing throughout.  Pillows placed under bilateral upper extremities to support elbows/shoulders and elevate hands.  OT downgraded goals, and reduced frequency to 1x/wk.  Will reassess for potential ongoing, and LTACH recommended for post acute.        If plan is discharge home, recommend the following:  Two people to help with walking and/or transfers;Two people to help with bathing/dressing/bathroom;Assist for transportation   Equipment Recommendations  Wheelchair (measurements OT);Wheelchair cushion (measurements OT);Hoyer lift;Hospital bed    Recommendations for Other Services      Precautions / Restrictions Precautions Precautions: Fall;Cervical;Back Recall of Precautions/Restrictions: Impaired Precaution/Restrictions Comments: Cortrak, trach, watch for potential UE splinting needs Required Braces or Orthoses: Cervical Brace;Spinal Brace Cervical Brace: Hard collar Spinal Brace: Lumbar corset Restrictions Weight Bearing Restrictions Per Provider Order: No       Mobility Bed Mobility Overal bed mobility: Needs Assistance Bed Mobility: Rolling Rolling:  Total assist, +2 for physical assistance              Transfers                   General transfer comment: Unable     Balance                                           ADL either performed or assessed with clinical judgement   ADL   Eating/Feeding: NPO   Grooming: Total assistance;Bed level   Upper Body Bathing: Total assistance;Bed level   Lower Body Bathing: Total assistance;Bed level   Upper Body Dressing : Total assistance;Bed level   Lower Body Dressing: Total assistance;Bed level       Toileting- Clothing Manipulation and Hygiene: Total assistance;Bed level              Extremity/Trunk Assessment Upper Extremity Assessment RUE Deficits / Details: edematous hand, painful ROM to shoulder with resistance RUE Coordination: decreased fine motor;decreased gross motor LUE Deficits / Details: edema to hand.  Patient not trying any AROM, full PROM with 1 finger sublux to shoulder LUE Coordination: decreased fine motor;decreased gross motor   Lower Extremity Assessment Lower Extremity Assessment: Defer to PT evaluation        Vision   Vision Assessment?: No apparent visual deficits   Perception Perception Perception: Not tested   Praxis Praxis Praxis: Not tested   Communication Communication Communication: Impaired Factors Affecting Communication: Trach/intubated;Difficulty expressing self   Cognition Arousal: Lethargic Behavior During Therapy: Flat affect Cognition: No family/caregiver present to determine baseline  Following commands: Impaired Following commands impaired: Follows one step commands inconsistently      Cueing   Cueing Techniques: Verbal cues, Gestural cues  Exercises General Exercises - Upper Extremity Shoulder Flexion: PROM, Both, Supine Elbow Flexion: Both, Supine, PROM Elbow Extension: PROM, Both, Supine    Shoulder Instructions       General  Comments      Pertinent Vitals/ Pain       Pain Assessment Pain Assessment: Faces Faces Pain Scale: Hurts even more Pain Location: generalized, neck, R UE with PROM Pain Descriptors / Indicators: Grimacing, Discomfort Pain Intervention(s): Monitored during session                                                          Frequency  Min 1X/week        Progress Toward Goals  OT Goals(current goals can now be found in the care plan section)  Progress towards OT goals: OT to reassess next treatment;Goals drowngraded-see care plan  Acute Rehab OT Goals OT Goal Formulation: Patient unable to participate in goal setting Time For Goal Achievement: 05/29/24 Potential to Achieve Goals: Fair ADL Goals Pt Will Perform Grooming: with max assist;bed level  Plan      Co-evaluation                 AM-PAC OT 6 Clicks Daily Activity     Outcome Measure   Help from another person eating meals?: Total Help from another person taking care of personal grooming?: Total Help from another person toileting, which includes using toliet, bedpan, or urinal?: Total Help from another person bathing (including washing, rinsing, drying)?: Total Help from another person to put on and taking off regular upper body clothing?: Total Help from another person to put on and taking off regular lower body clothing?: Total 6 Click Score: 6    End of Session Equipment Utilized During Treatment: Oxygen;Cervical collar  OT Visit Diagnosis: Muscle weakness (generalized) (M62.81)   Activity Tolerance Patient limited by pain   Patient Left in bed;with call bell/phone within reach;with bed alarm set   Nurse Communication Need for lift equipment        Time: 9151-9098 OT Time Calculation (min): 13 min  Charges: OT General Charges $OT Visit: 1 Visit OT Treatments $Therapeutic Exercise: 8-22 mins  05/16/2024  RP, OTR/L  Acute Rehabilitation Services  Office:   [phone]   Charlie JONETTA Halsted 05/16/2024, 9:08 AM

## 2024-05-16 NOTE — Progress Notes (Signed)
 Rn attempted to call report, per secretary receiving RN is tied up with other patient.

## 2024-05-16 NOTE — Progress Notes (Signed)
 MD requests a repeat ABG be done at 1600.

## 2024-05-16 NOTE — Progress Notes (Signed)
 Speech Language Pathology Treatment: [name] [name] Speaking valve  Patient Details Name: [name] [name] MRN: [id number] DOB: 03/01/1976 Today's Date: 05/16/2024 Time: 8893-8879 SLP Time Calculation (min) (ACUTE ONLY): 14 min  Assessment / Plan / Recommendation Clinical Impression  Pt poorly participatory today; very lethargic. Provided oral care.  Fully deflated cuff.  PMV was placed - VS remained stable throughout its use (RR 9, HR 106, RR 96). There was occasional spontaneous voicing when exhaling, but no volitional effort to speak; not following commands.  RN in room and stated meds were being adjusted given pt's mental status.  Recommend staff place valve when they are present and when pt is alert.  Remove before leaving. Signage re: PMV is at [facility]. SLP will follow for PMV and readiness for swallow assessment.    HPI HPI: [name] [name] is a 56 yom who presented to [facility] ED 11/2 with AMS. Dx sepsis and transferred to [facility].  Intubated 11/2-3; reintubation same day; brief cardiac arrest during intubation; extubated 11/9. Reintubated 11/16; trach 11/24. Cortrak 11/24. Increasing time on TC. Dx bilateral septic pulmonary emboli, tricuspid valve endocarditis, PMHx significant for MSSA bacteremia, MSSA PNA, infective endocarditis, cervical through lumbar osteomyelitis, psoas abscess, severe protein calorie malnutrition/FTT, substance abuse.      SLP Plan  Continue with current plan of care                 Recommendations         Patient may use Passy-Muir Speech Valve: During all therapies with supervision PMSV Supervision: Full                       Continue with current plan of care    [name] [name] L. [place], MA CCC/SLP Clinical Specialist - Acute Care SLP Acute Rehabilitation Services Office number [phone]  [name] [name] [name] [name]  05/16/2024, 11:30 AM

## 2024-05-16 NOTE — Progress Notes (Addendum)
 PROGRESS NOTE     Patient Demographics:    [name] [name], is a 48 y.o. male, DOB - 21-May-1976, FMW:[id number]  Outpatient Primary MD for the patient is Bertell Satterfield, MD    LOS - 32  Admit date - 04/14/2024    Chief Complaint  Patient presents with   Altered Mental Status       Brief Narrative (HPI from H&P)   48 year old man who presented to [facility] ED 11/2 with AMS. PMHx significant for MSSA bacteremia, MSSA PNA, infective endocarditis, cervical through lumbar osteomyelitis, psoas abscess, severe protein calorie malnutrition/FTT, substance abuse. Recent admission 10/2 - 10/13 for sepsis, widespread MSSA.   Arrived to [facility] ED covered in feces, altered and with poor airway protection; he was subsequently intubated. CXR demonstrated RLL opacities. Confirmed bilateral septic pulmonary emboli, tricuspid valve endocarditis.  Required intubation and mechanical ventilation. Clinically progressed and was transferred out of ICU 11/10.   PCCM reconsulted 11/13 for AMS, tachypnea and tachycardia stabilized in ICU and transferred to hospitalist service on 05/15/2024 on day 30 one of his hospital stay.  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  Pertinent Medical History:  MSSA bacteremia MSSA PNA Lumbar osteomyelitis  Psoas abscess  Severe protein calorie malnutrition FTT in adult   Significant Hospital Events: Including procedures, antibiotic start and stop dates in addition to other  pertinent events   11/2 [facility] ED w AMS. Intubated. Started on vanc mero. Txf request to [place] to [facility]  11/3 Extubated, developed respiratory distress requiring reintubation. Brief cardiac arrest during intubation.  11/3 Transthoracic echocardiogram > LVEF 40-45%, global hypokinesis, grade 1 diastolic dysfunction, normal RV function.  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  No mitral or aortic valve involvement 11/3 CT Chest, abdomen, pelvis >> numerous bilateral cavitary and solid nodules consistent with septic emboli, left lower lobe consolidation with a  small pleural effusion, right basilar atelectasis.  Moderate free fluid in the pelvis.  Erosive changes at T5-T6, L2-L3, L4-L5 suggestive of discitis 11/3 CT Head >> no acute abnormality.  Opacified left maxillary sinus 11/10 Transferred out of ICU. 11/13 PCCM reconsulted for decreased mental status, tachycardia, tachypnea. Started diuretics 11/16 Poorly responsive, transferred back to ICU and intubated for the third time. Pressors started post intubation. Renal function worse. UOP poor.  Trach aspirate - Candida Trorpicals and Calcians 11/20 NO acute events overnight, awake and following commands.  Patient and spouse met with palliative care and collectively decision was made to proceed with trach and PEG if needed Blood culture - NEG BAL - normal flora 11/21 no acute events overnight, hemoglobin remained stable 11/23 pain, discomfort, muscle spasms 11/24 CCS trach, cortrak, off precedex  11/25 PSV 5/5 for 8hrs 11/26 - In better place mentally, motivated to continue to get better.  Thankful for care being provided. C/o of ongoing neck and back pain. Asking not to tell him when on PSV so he doesn't get more anxious Did ATC briefly Went for MRI but too much motion artifcat despite precedex  and intermitten fent/versed  and propofol  .  11/27   needs MRI again but concerns for anxiety and motion again. LE weaker  than UE. Now on  ATC Trach 40%. Afebrilre. Vent overnight. C collar remains. RN sas stage 2 decub slowly progressing to stage 3.   Family request change over from klonopin  to xanax , started on methadone   11/28 - Placed on dilaudid  gtt last evening for ongoing pain, Currently rates 5/10. MRI - spine nnil acute Increaesd respioratiory secretions and o2 needs up  =-> started Ceftriaxone  and Flagyl  (Ancef  needs to be restarted 12/2 and continued through 12/16) Cutlure growing ABUNDANT SERRATA  on 05/12/24 11/30 Sputum with serratia. Trach collar all day. Day 4 methadone . PRN dilaudid  decreased.  12/1 - urology signed off - plan for long term foley with outpt urology f/u- change catheter over wire every 30 days 05/15/2024 transferred to [facility].   Subjective:    [name] [name] peers to be in no distress but more sleepy this morning, on multiple sedative medications, responds to painful stimuli   Assessment  & Plan :   MSSA with Dissemination complicated by infective endocarditis of the tricuspid valve - last echo 04/15/24, lumbar osteomyelitis, Psoas abscess, septic pulmonary emboli, C-spine septic arthritis, had multiple courses of antibiotics in the past. ID following currently on cefazolin  stop date 06/01/2024 may need PO after that, will defer to ID.   VAP 05/10/24 - confirmed as SERRATIA 05/12/24  - ID following, appreciate recs as above - Will repeat an echocardiogram 06/01/2024.   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, minimal upper extremity strength as well.  Being a functional quadriplegic, urinary retention, dysphagia requiring NG tube, failure to protect airway requiring tracheostomy and c-collar  - last seen by NSGY 04/24/24 - 11/27 MRI - - MRI lumbar and thoracic as above> unresolved but stable thoracic imaging, lumbar imaging noted regressed psoas muscle and posterior facet joint abscesses but noted new abnormal signal at anterior T12 endplate, unable to  exclude new osteomyelitis - cont c-collar and LSO brace when upright for pain, antibiotics per ID - will need 6 week f/u with NSGY around week of 12/22 - recs for medical management, not a surgical candidate at that    Acute  on chronic hypoxic and hypercapnic respiratory failure secondary to MSSA pneumonia, ARDS (resolved), mucous plugging (improved)  and  Septic pulmonary emboli - Tracheostomy placement 11/24 - tol ATC since 6am 12/1 - ?if still should have vent at night - cont ATC as tol - pulmonary hygiene  - SLP following - PT/OT - intermittent CXR    Dysphagia due to above.  On NG tube, speech following, may require peg tube eventually.    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   Nonoliguric AKI Urinary retention  Hyperkalemia,  - Nephrology s/o 11/21 - s/p foley placement by urology 11/16 given pendulous and bulbar urethral injuries / false pass -> removed 11/26 but ongoing retention> required Urology placement for Foley 05/10/24.   Continues to have poor renal function and hyperkalemia, on scheduled Lokelma , tube feeds changed, having bowel movements, will repeat UA with urine protein/creatinine ratio along with renal ultrasound.  Has been seen by nephrology earlier.   Chronic anemia - trend CBC periodically - transfuse per protocol    Protein calorie malnutrition -  cont EN per RD via cortrak, MVI.     Stage II pressure ulcer sacrum, present on admission - cont wound care - maximize nutrition, frequent turns   Deconditioning   Heparin  for VTE ppx PPI    DISPO - LTAC  consult - Picc line placed 12/1         Condition - Extremely Guarded  Family Communication  :  wife updated [phone] on 05/15/24  Code Status :  Full  Consults  :  PCCM, NS, ID  PUD Prophylaxis :  PPI   Procedures  :            Disposition Plan  :    Status is: Inpatient   DVT Prophylaxis  :    Place TED hose Start: 05/08/24 0813 heparin  injection 5,000 Units Start: 05/02/24 1400 SCDs Start: 04/14/24 2038   Lab Results  Component Value Date   PLT 189 05/16/2024    Diet :  Diet Order             Diet NPO time specified  Diet effective midnight                    Inpatient Medications  Scheduled Meds:  [START ON 05/17/2024] ALPRAZolam   0.5 mg Per Tube BID   Chlorhexidine  Gluconate Cloth  6 each Topical Daily   folic acid   1 mg Per Tube Daily   free water   100 mL Per Tube Q8H   gabapentin   300 mg Per Tube Q8H   guaiFENesin   5 mL Per Tube Q4H   heparin  injection (subcutaneous)  5,000 Units Subcutaneous Q8H   lidocaine   1 patch Transdermal Q24H   [START ON 05/17/2024] methadone   5 mg Per Tube Daily   midodrine   2.5 mg Per Tube TID WC   multivitamin with minerals  1 tablet Per Tube Daily   mouth rinse  15 mL Mouth Rinse 4 times per day   pantoprazole  (PROTONIX ) IV  40 mg Intravenous Q24H   polyethylene glycol  17 g Per Tube BID   sennosides  10 mL Per Tube BID   sodium zirconium  cyclosilicate  10 g Per Tube BID   sodium zirconium cyclosilicate   10 g Per Tube Once   Continuous Infusions:   ceFAZolin  (ANCEF ) IV Stopped (05/16/24 0602)   feeding supplement (NEPRO CARB STEADY)     PRN Meds:.acetaminophen , ipratropium-albuterol , ondansetron  (ZOFRAN ) IV, mouth rinse, sodium chloride  flush    Objective:   Vitals:   05/16/24 0500 05/16/24 0745 05/16/24 0813 05/16/24 0830  BP:  (!) 133/95  (!) 135/98  Pulse:  (!) 107  (!) 102  Resp:  (!) 9 15 11   Temp:    (!) 97.4 F (36.3 C)  TempSrc:    Axillary  SpO2:  97%  96%  Weight: 92.3 kg      Height:        Wt Readings from Last 3 Encounters:  05/16/24 92.3 kg  03/15/24 62.5 kg  11/09/16 74.5 kg     Intake/Output Summary (Last 24 hours) at 05/16/2024 1017 Last data filed at 05/16/2024 0700 Gross per 24 hour  Intake 1675 ml  Output 2000 ml  Net -325 ml     Physical Exam  Somnolent this morning, responds to painful stimuli, R leg 1/5, L leg 2/5, upper extremities 2/5, trach collar, C Collar, Foley, R Arm PICC, NG tube [place].AT,PERRAL Supple Neck, No JVD,   Symmetrical Chest wall movement, Good air movement bilaterally, CTAB RRR,No Gallops,Rubs or new Murmurs,  +ve B.Sounds, Abd Soft, No tenderness,   No Cyanosis, Clubbing or edema     RN pressure injury documentation: Wound 03/15/24 0130 Pressure Injury Sacrum Medial Stage 3 -  Full thickness tissue loss. Subcutaneous fat may be visible but bone, tendon or muscle are NOT exposed. (Active)     Wound 05/15/24 1200 Pressure Injury Buttocks Right Stage 2 -  Partial thickness loss of dermis presenting as a shallow open injury with a red, pink wound bed without slough. (Active)      Data Review:    Recent Labs  Lab 05/11/24 0511 05/13/24 0742 05/14/24 0525 05/15/24 1017 05/16/24 0311  WBC 12.4* 13.3* 12.2* 13.7* 12.9*  HGB 7.9* 7.7* 8.4* 7.6* 7.8*  HCT 26.8* 26.1* 28.7* 26.0* 27.7*  PLT 165 189 194 191 189  MCV 96.8 98.9 99.3 100.4* 103.0*  MCH 28.5 29.2 29.1 29.3 29.0  MCHC 29.5* 29.5* 29.3* 29.2* 28.2*  RDW 18.3* 18.2* 18.0* 17.9* 17.6*  LYMPHSABS  --   --   --  0.5* 0.5*  MONOABS  --   --   --  1.2* 1.4*  EOSABS  --   --   --  0.0 0.0  BASOSABS  --   --   --  0.0 0.0    Recent Labs  Lab 05/10/24 0230 05/11/24 0511 05/13/24 0742 05/14/24 0525 05/15/24 0237 05/15/24 1017 05/16/24 0311  NA 137 138 137 137  --  139 138  K 4.7 4.8 4.8 5.1  --  5.9* 5.7*  CL 101 100 98 99  --  97* 99  CO2 26 27 29 30   --  30 31  ANIONGAP 10 11 10 8   --  12 8  GLUCOSE 111* 117* 113* 125*  --  115* 129*  BUN 105*  105* 113* 115*  --  115* 118*  CREATININE 2.68* 2.68* 2.73* 2.71*  --  2.74* 2.79*  AST 10*  --  11*  --   --  <10* <10*  ALT <5  --  <5  --   --  <5 <5  ALKPHOS 85  --  100  --   --  104 111  BILITOT 0.8  --  0.7  --   --  0.9 0.9  ALBUMIN  1.5*  --  1.5*  --   --  1.6* 1.6*  CRP  --   --   --   --  13.1*  --  15.1*  PROCALCITON  --   --   --   --   --  1.07 0.96  INR  --   --   --   --   --  1.3*  --   MG 2.1  --  2.2 2.3  --  2.5* 2.5*  PHOS 6.0*  --  6.1* 6.6*  --  6.0* 6.8*  CALCIUM  8.1* 8.2* 8.4* 8.5*  --  8.4* 8.3*      Recent Labs  Lab 05/10/24 0230 05/11/24 0511 05/13/24 0742 05/14/24 0525 05/15/24 0237 05/15/24 1017 05/16/24 0311  CRP  --   --   --   --  13.1*  --  15.1*  PROCALCITON  --   --   --   --   --  1.07 0.96  INR  --   --   --   --   --  1.3*  --   MG 2.1  --  2.2 2.3  --  2.5* 2.5*  CALCIUM  8.1* 8.2* 8.4* 8.5*  --  8.4* 8.3*    --------------------------------------------------------------------------------------------------------------- Lab Results  Component Value Date   TRIG 101 05/05/2024    Lab Results  Component Value Date   HGBA1C 4.6 (L) 04/25/2024   No results for input(s): TSH, T4TOTAL, FREET4, T3FREE, THYROIDAB in the last 72 hours. No results for input(s): VITAMINB12, FOLATE, FERRITIN, TIBC, IRON , RETICCTPCT in the last 72 hours. ------------------------------------------------------------------------------------------------------------------ Cardiac Enzymes No results for input(s): CKMB, TROPONINI, MYOGLOBIN in the last 168 hours.  Invalid input(s): CK  Micro Results Recent Results (from the past 240 hours)  Culture, Respiratory w Gram Stain     Status: None   Collection Time: 05/10/24 10:34 AM   Specimen: Tracheal Aspirate; Respiratory  Result Value Ref Range Status   Specimen Description TRACHEAL ASPIRATE  Final   Special Requests NONE  Final   Gram Stain   Final    MODERATE WBC PRESENT,  PREDOMINANTLY PMN ABUNDANT GRAM NEGATIVE RODS RARE YEAST Performed at [facility], [address], [place], [place] [address]    Culture ABUNDANT SERRATIA MARCESCENS  Final   Report Status 05/14/2024 FINAL  Final   Organism ID, Bacteria SERRATIA MARCESCENS  Final      Susceptibility   Serratia marcescens - MIC*    CEFEPIME  <=0.12 SENSITIVE Sensitive     ERTAPENEM <=0.12 SENSITIVE Sensitive     CEFTRIAXONE  <=0.25 SENSITIVE Sensitive     CIPROFLOXACIN  0.12 SENSITIVE Sensitive     GENTAMICIN <=1 SENSITIVE Sensitive     MEROPENEM  0.5 SENSITIVE Sensitive     TRIMETH /SULFA  <=20 SENSITIVE Sensitive     * ABUNDANT SERRATIA MARCESCENS  MRSA Next Gen by PCR, Nasal     Status: None   Collection Time: 05/15/24 10:08 AM   Specimen: Nasal Mucosa; Nasal Swab  Result Value Ref Range Status   MRSA by PCR Next Gen NOT DETECTED NOT DETECTED Final    Comment: (NOTE) The GeneXpert MRSA Assay (FDA approved for NASAL specimens only), is one component of a comprehensive MRSA colonization surveillance program. It is not intended to diagnose MRSA infection nor to guide or monitor treatment for MRSA infections.  Test performance is not FDA approved in patients less than 33 years old. Performed at [facility], [address], [place], [place] [address]     Radiology Report DG Chest Port 1 View Result Date: 05/15/2024 EXAM: 1 VIEW(S) XRAY OF THE CHEST 05/15/2024 07:13:00 AM COMPARISON: 05/12/2024 CLINICAL HISTORY: SOB (shortness of breath) FINDINGS: LINES, TUBES AND DEVICES: Tracheostomy tube in place. Enteric tube courses below diaphragm with distal tip beyond inferior margin of film. Right upper extremity PICC with tip at superior cavoatrial junction. LUNGS AND PLEURA: Increased patchy airspace opacity in left mid to lower lung zone. Increased right basilar airspace opacity. Trace right pleural effusion. No pneumothorax. HEART AND MEDIASTINUM: No acute abnormality of the cardiac and mediastinal  silhouettes. BONES AND SOFT TISSUES: No acute osseous abnormality. IMPRESSION: 1. Increased patchy airspace opacity in the left mid to lower lung zone and increased right basilar airspace opacity, compatible with multifocal airspace disease. 2. Trace right pleural effusion. Electronically signed by: Waddell Calk MD 05/15/2024 07:20 AM EST RP Workstation: [id number]     Signature  -   Lavada Stank M.D on 05/16/2024 at 10:17 AM   -  To page go to [email or web address]

## 2024-05-16 NOTE — Progress Notes (Addendum)
 Nutrition Follow-up  DOCUMENTATION CODES:   Severe malnutrition in context of social or environmental circumstances  INTERVENTION:  Modify TF via Cortrak: Nepro Carb Steady at 50ml/hr (1200ml per day)  Free water  flushes 100ml q8h ( per day)  Provides 2124 kcal, 97g protein, 1172 ml free water  daily   Monitor for SLP recommendations following tentative swallow study later in the week.  Monitor for diet advancement/tolerance and add ONS as able  NUTRITION DIAGNOSIS:  Severe Malnutrition related to social / environmental circumstances (IVDU and alcohol abuse) as evidenced by severe fat depletion, severe muscle depletion.  GOAL:  Patient will meet greater than or equal to 90% of their needs  MONITOR:  Vent status, TF tolerance, Labs  REASON FOR ASSESSMENT:  Consult, Ventilator Enteral/tube feeding initiation and management  ASSESSMENT:   Pt admitted with metabolic encephalopathy and presumed septic shock d/t metastatic MSSA infection with concern for endocarditis. PMH significant for recent admission (10/2-10/13 leaving AMA) for MSSA, PNA, psoas abscesses and lumbar osteomyelitis, tobacco use, IVDU and alcohol use  11/2: admitted, transferred from [facility] to [facility] 11/3: extubated; respiratory distress; re-intubated, cardiac arrest, ROSC; echo>LVEF 40-45%, normal RV function 11/7: cortrak placed 11/6: extubated; diet advanced to regular 11/10: transfer out of ICU 11/16: AMS, tachypnea, tachycardic; re-intubated d/t respiratory failure; TF started 11/20: s/p bronchoscopy; PMT meeting- full scope, ok with trach/PEG; CT chest -extensive atelectasis, increasing areas of cavitation, pleural effusions, erosive changes at T5-T6, L2-L3, and L4-L5 concerning for osteomyelitis/discitis 11/24: s/p trach placement; Cortrak placed 11/30: sputum culture growing serratia 12/03: transfer out of ICU 12/04: change TF formula to Nepro  Patient now transferred out of ICU. Received request from  MD to modify TF formula to Nepro to decrease potassium provision. Noted that patient without BM x3-4 days until this morning post lab draw. Potassium within range over last week  aside from last two days. He reports patient has been receiving Lokelma  off and on. Will modify formula, per MD request. Likely d/t discontinuation of sodium bicarbonate  and not tube feeding.   Unable to rouse patient this morning at bedside. Medications being adjusted d/t change in mental status. Not currently following commands. Tolerating tube feed, which is running at goal rate.  MD requesting assessment by pulmonary critical care due to patient obtunded status. Per pulmonary, while sedating medications can be contributing, concern for maintaining pulmonary endurance and continued poor respiratory mechanics make him an undesirable candidate for decannulation. Holding off on changing to cuffless trach for now.    SLP continues to follow patient and work with PMV.  Continue to recommend staff place valve when they are present and patient is alert.Continue to monitor for appropriateness to complete instrumental swallow study.   Admit weight: 62.5 kg Current weight: 92.3 kg (+ mild pitting edema generalized, BUE; deep pitting edema to BLE)  Significant trend up in weight in transfer out of ICU. Likely scale calibration issue or inaccurate weight collection. Will monitor trend. Significant edema continues.  Drains/Lines: Cortrak (placed 11/24) R brachial: PICC, single lumen Tracheostomy: Fhiley, 6mm cuffed Foley catheter UOP: 2100 mL x24 hours   Medications: folvite , MVI, miralax  BID, Senokot BID, pantoprazole , lokelma , IV ABX   Labs:  K+ 5.1>5.9>5.7 BUN 118 Cr 2.79 Phosphorus 6.8 Mg 2.5  GFR 27 CRP 15.1  WBC 12.9 CBG's 115-129 x24 hours A1c 4.6 (04/2024)  Diet Order:   Diet Order             Diet NPO time specified  Diet effective midnight  EDUCATION NEEDS:  Not appropriate for education at  this time  Skin:  Skin Integrity Issues:: Stage I Stage I: right coccyx Stage II: R buttocks  Last BM:  12/04 - type 4 x1  Height:  Ht Readings from Last 1 Encounters:  04/19/24 6' (1.829 m)   Weight:  Wt Readings from Last 1 Encounters:  05/16/24 92.3 kg   Ideal Body Weight:  80.9 kg  BMI:  Body mass index is 27.6 kg/m.  Estimated Nutritional Needs:   Kcal:  2000-2200  Protein:  100-115g  Fluid:  >/=2L  Blair Deaner MS, RD, LDN Registered Dietitian Clinical Nutrition RD Inpatient Contact Info in Amion

## 2024-05-16 NOTE — Progress Notes (Signed)
 Critical ABG results given to MD.   Latest Reference Range & Units 05/16/24 12:31  pH, Arterial 7.35 - 7.45  7.28 (L)  pCO2 arterial 32 - 48 mmHg 69 (HH)  pO2, Arterial 83 - 108 mmHg 78 (L)  Acid-Base Excess 0.0 - 2.0 mmol/L 3.5 (H)  Bicarbonate 20.0 - 28.0 mmol/L 32.6 (H)  O2 Saturation % 98.8  (HH): Data is critically high (L): Data is abnormally low (H): Data is abnormally high

## 2024-05-17 LAB — COMPREHENSIVE METABOLIC PANEL WITH GFR
ALT: 5 U/L (ref 0–44)
ALT: <5 U/L (ref 0–44)
AST: 14 U/L — ABNORMAL LOW (ref 15–41)
AST: 14 U/L — ABNORMAL LOW (ref 15–41)
Albumin: <1.5 g/dL — ABNORMAL LOW (ref 3.5–5.0)
Albumin: <1.5 g/dL — ABNORMAL LOW (ref 3.5–5.0)
Alkaline Phosphatase: 145 U/L — ABNORMAL HIGH (ref 38–126)
Alkaline Phosphatase: 143 U/L — ABNORMAL HIGH (ref 38–126)
Anion gap: 13 (ref 5–15)
Anion gap: 9 (ref 5–15)
BUN: 119 mg/dL — ABNORMAL HIGH (ref 6–20)
BUN: 119 mg/dL — ABNORMAL HIGH (ref 6–20)
CO2: 29 mmol/L (ref 22–32)
CO2: 31 mmol/L (ref 22–32)
Calcium: 8 mg/dL — ABNORMAL LOW (ref 8.9–10.3)
Calcium: 8.2 mg/dL — ABNORMAL LOW (ref 8.9–10.3)
Chloride: 97 mmol/L — ABNORMAL LOW (ref 98–111)
Chloride: 100 mmol/L (ref 98–111)
Creatinine, Ser: 2.75 mg/dL — ABNORMAL HIGH (ref 0.61–1.24)
Creatinine, Ser: 2.95 mg/dL — ABNORMAL HIGH (ref 0.61–1.24)
GFR, Estimated: 28 mL/min — ABNORMAL LOW (ref >60)
GFR, Estimated: 26 mL/min — ABNORMAL LOW (ref >60)
Glucose, Bld: 108 mg/dL — ABNORMAL HIGH (ref 70–99)
Glucose, Bld: 115 mg/dL — ABNORMAL HIGH (ref 70–99)
Potassium: 4.9 mmol/L (ref 3.5–5.1)
Potassium: 4.9 mmol/L (ref 3.5–5.1)
Sodium: 139 mmol/L (ref 135–145)
Sodium: 140 mmol/L (ref 135–145)
Total Bilirubin: 0.9 mg/dL (ref 0.0–1.2)
Total Bilirubin: 0.8 mg/dL (ref 0.0–1.2)
Total Protein: 6.1 g/dL — ABNORMAL LOW (ref 6.5–8.1)
Total Protein: 6.1 g/dL — ABNORMAL LOW (ref 6.5–8.1)

## 2024-05-17 LAB — CBC WITH DIFFERENTIAL/PLATELET
Abs Immature Granulocytes: 0.08 K/uL — ABNORMAL HIGH (ref 0.00–0.07)
Abs Immature Granulocytes: 0.05 K/uL (ref 0.00–0.07)
Basophils Absolute: 0 K/uL (ref 0.0–0.1)
Basophils Absolute: 0 K/uL (ref 0.0–0.1)
Basophils Relative: 0 %
Basophils Relative: 0 %
Eosinophils Absolute: 0.1 K/uL (ref 0.0–0.5)
Eosinophils Absolute: 0.1 K/uL (ref 0.0–0.5)
Eosinophils Relative: 1 %
Eosinophils Relative: 1 %
HCT: 19.6 % — ABNORMAL LOW (ref 39.0–52.0)
HCT: 19.6 % — ABNORMAL LOW (ref 39.0–52.0)
Hemoglobin: 5.9 g/dL — CL (ref 13.0–17.0)
Hemoglobin: 5.9 g/dL — CL (ref 13.0–17.0)
Immature Granulocytes: 1 %
Immature Granulocytes: 1 %
Lymphocytes Relative: 7 %
Lymphocytes Relative: 7 %
Lymphs Abs: 0.5 K/uL — ABNORMAL LOW (ref 0.7–4.0)
Lymphs Abs: 0.5 K/uL — ABNORMAL LOW (ref 0.7–4.0)
MCH: 29.4 pg (ref 26.0–34.0)
MCH: 29.6 pg (ref 26.0–34.0)
MCHC: 30.1 g/dL (ref 30.0–36.0)
MCHC: 30.1 g/dL (ref 30.0–36.0)
MCV: 97.5 fL (ref 80.0–100.0)
MCV: 98.5 fL (ref 80.0–100.0)
Monocytes Absolute: 0.8 K/uL (ref 0.1–1.0)
Monocytes Absolute: 0.8 K/uL (ref 0.1–1.0)
Monocytes Relative: 11 %
Monocytes Relative: 11 %
Neutro Abs: 5.9 K/uL (ref 1.7–7.7)
Neutro Abs: 5.8 K/uL (ref 1.7–7.7)
Neutrophils Relative %: 80 %
Neutrophils Relative %: 80 %
Platelets: 159 K/uL (ref 150–400)
Platelets: 146 K/uL — ABNORMAL LOW (ref 150–400)
RBC: 2.01 MIL/uL — ABNORMAL LOW (ref 4.22–5.81)
RBC: 1.99 MIL/uL — ABNORMAL LOW (ref 4.22–5.81)
RDW: 18 % — ABNORMAL HIGH (ref 11.5–15.5)
RDW: 18 % — ABNORMAL HIGH (ref 11.5–15.5)
WBC: 7.4 K/uL (ref 4.0–10.5)
WBC: 7.2 K/uL (ref 4.0–10.5)
nRBC: 0 % (ref 0.0–0.2)
nRBC: 0 % (ref 0.0–0.2)

## 2024-05-17 LAB — PROCALCITONIN: Procalcitonin: 0.86 ng/mL

## 2024-05-17 LAB — MAGNESIUM
Magnesium: 2.3 mg/dL (ref 1.7–2.4)
Magnesium: 2.3 mg/dL (ref 1.7–2.4)

## 2024-05-17 LAB — CBC
HCT: 23.2 % — ABNORMAL LOW (ref 39.0–52.0)
HCT: 21.5 % — ABNORMAL LOW (ref 39.0–52.0)
Hemoglobin: 7.2 g/dL — ABNORMAL LOW (ref 13.0–17.0)
Hemoglobin: 6.6 g/dL — CL (ref 13.0–17.0)
MCH: 29.8 pg (ref 26.0–34.0)
MCH: 29.2 pg (ref 26.0–34.0)
MCHC: 31 g/dL (ref 30.0–36.0)
MCHC: 30.7 g/dL (ref 30.0–36.0)
MCV: 95.9 fL (ref 80.0–100.0)
MCV: 95.1 fL (ref 80.0–100.0)
Platelets: 156 K/uL (ref 150–400)
Platelets: 161 K/uL (ref 150–400)
RBC: 2.42 MIL/uL — ABNORMAL LOW (ref 4.22–5.81)
RBC: 2.26 MIL/uL — ABNORMAL LOW (ref 4.22–5.81)
RDW: 18.6 % — ABNORMAL HIGH (ref 11.5–15.5)
RDW: 18.9 % — ABNORMAL HIGH (ref 11.5–15.5)
WBC: 9 K/uL (ref 4.0–10.5)
WBC: 7.4 K/uL (ref 4.0–10.5)
nRBC: 0 % (ref 0.0–0.2)
nRBC: 0 % (ref 0.0–0.2)

## 2024-05-17 LAB — POCT I-STAT 7, (LYTES, BLD GAS, ICA,H+H)
Acid-Base Excess: 14 mmol/L — ABNORMAL HIGH (ref 0.0–2.0)
Bicarbonate: 38.7 mmol/L — ABNORMAL HIGH (ref 20.0–28.0)
Calcium, Ion: 1.15 mmol/L (ref 1.15–1.40)
HCT: 19 % — ABNORMAL LOW (ref 39.0–52.0)
Hemoglobin: 6.5 g/dL — CL (ref 13.0–17.0)
O2 Saturation: 91 %
Patient temperature: 37.3
Potassium: 4.8 mmol/L (ref 3.5–5.1)
Sodium: 140 mmol/L (ref 135–145)
TCO2: 40 mmol/L — ABNORMAL HIGH (ref 22–32)
pCO2 arterial: 52.5 mmHg — ABNORMAL HIGH (ref 32–48)
pH, Arterial: 7.476 — ABNORMAL HIGH (ref 7.35–7.45)
pO2, Arterial: 59 mmHg — ABNORMAL LOW (ref 83–108)

## 2024-05-17 LAB — PREPARE RBC (CROSSMATCH)

## 2024-05-17 LAB — PROTIME-INR
INR: 1.4 — ABNORMAL HIGH (ref 0.8–1.2)
Prothrombin Time: 17.7 s — ABNORMAL HIGH (ref 11.4–15.2)

## 2024-05-17 LAB — PHOSPHORUS
Phosphorus: 4.9 mg/dL — ABNORMAL HIGH (ref 2.5–4.6)
Phosphorus: 4.9 mg/dL — ABNORMAL HIGH (ref 2.5–4.6)

## 2024-05-17 LAB — GLUCOSE, CAPILLARY
Glucose-Capillary: 104 mg/dL — ABNORMAL HIGH (ref 70–99)
Glucose-Capillary: 110 mg/dL — ABNORMAL HIGH (ref 70–99)
Glucose-Capillary: 111 mg/dL — ABNORMAL HIGH (ref 70–99)
Glucose-Capillary: 115 mg/dL — ABNORMAL HIGH (ref 70–99)

## 2024-05-17 LAB — URIC ACID: Uric Acid, Serum: 4.9 mg/dL (ref 3.7–8.6)

## 2024-05-17 LAB — C-REACTIVE PROTEIN
CRP: 12.2 mg/dL — ABNORMAL HIGH (ref <1.0)
CRP: 13.4 mg/dL — ABNORMAL HIGH (ref <1.0)

## 2024-05-17 LAB — LACTATE DEHYDROGENASE: LDH: 103 U/L — ABNORMAL LOW (ref 105–235)

## 2024-05-17 MED ORDER — SODIUM CHLORIDE 0.9% IV SOLUTION: Freq: Once | INTRAVENOUS | Status: AC

## 2024-05-17 MED ORDER — FUROSEMIDE 10 MG/ML IJ SOLN
40.0000 mg | Freq: Once | INTRAMUSCULAR | Status: AC
  Administered 2024-05-17: 40 mg via INTRAVENOUS
  Filled 2024-05-17: qty 4

## 2024-05-17 MED ORDER — ORAL CARE MOUTH RINSE
15.0000 mL | OROMUCOSAL | Status: DC
  Administered 2024-05-17 – 2024-05-20 (×35): 15 mL via OROMUCOSAL

## 2024-05-17 MED ORDER — PANTOPRAZOLE SODIUM 40 MG IV SOLR
40.0000 mg | Freq: Two times a day (BID) | INTRAVENOUS | Status: DC
  Administered 2024-05-17 – 2024-05-20 (×8): 40 mg via INTRAVENOUS
  Filled 2024-05-17 (×8): qty 10

## 2024-05-17 MED ORDER — ORAL CARE MOUTH RINSE: 15.0000 mL | OROMUCOSAL | Status: DC | PRN

## 2024-05-17 NOTE — Progress Notes (Signed)
 Physical Therapy Treatment Patient Details Name: [name] [name] MRN: [id number] DOB: 09-Apr-1976 Today's Date: 05/17/2024   History of Present Illness 48 yo M recently admitted with MSSA PNA MSSA bacteremia psoas abscesses and lumbar osteo 03/14/24-03/25/24 (left AMA) who presented to [facility] ED 04/14/24 w AMS. Admitted with sepsis and transferred to [facility]. Arrived to ED covered in feces, altered, with poor airway protection and was intubated, Had PEA cardiac arrest on 11/3 with 4 mins CPR extubated on 11/9. 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 PMH: includes: anxiety, HTN    PT Comments  Patient seen for progression of ROM/strength as able for extremities,  noted edema in feet and hands and pt unable to tolerate much elevation of R LE and with inability to elevated R Shoulder.  He was also intolerant of attempts to elevated HOB with grimacing even with smallest movement.  He remains appropriate for LTACH post acute stay.  PT will continue to follow.     If plan is discharge home, recommend the following: Two people to help with walking and/or transfers;Two people to help with bathing/dressing/bathroom   Can travel by private vehicle        Equipment Recommendations  None recommended by PT    Recommendations for Other Services       Precautions / Restrictions Precautions Precautions: Fall;Cervical;Back Recall of Precautions/Restrictions: Impaired Precaution/Restrictions Comments: Cortrak, trach, watch for potential UE splinting needs Required Braces or Orthoses: Cervical Brace;Spinal Brace Cervical Brace: Hard collar Spinal Brace: Lumbar corset (FOR OOB)     Mobility  Bed Mobility               General bed mobility comments: elevated HOB minimally and pt grimacing (RN reports pain meds reduced due to concern for  sedation)    Transfers                        Ambulation/Gait                   Stairs             Wheelchair Mobility     Tilt Bed    Modified Rankin (Stroke Patients Only)       Balance                                            Communication Communication Communication: Impaired Factors Affecting Communication: Trach/intubated;Difficulty expressing self  Cognition Arousal: Lethargic Behavior During Therapy: Flat affect   PT - Cognitive impairments: Difficult to assess Difficult to assess due to: Tracheostomy                       Following commands: Impaired Following commands impaired: Follows one step commands inconsistently    Cueing Cueing Techniques: Verbal cues, Gestural cues  Exercises General Exercises - Upper Extremity Shoulder Flexion: Both, Left, Right, AROM, PROM, Supine (elevated L shoulder spontaneously, not able to perform on R) Shoulder Horizontal ABduction: PROM, Both, Supine Shoulder Horizontal ADduction: PROM, Both, Supine Elbow Extension: AAROM, PROM, Both, Supine Digit Composite Flexion: Both, Supine, PROM Composite Extension: PROM, Both, Supine General Exercises - Lower Extremity Ankle Circles/Pumps: Both, Supine, AROM, AAROM Heel Slides: Both, Supine, PROM Hip ABduction/ADduction:  PROM, Both, Supine Straight Leg Raises: PROM, Both, Supine    General Comments General comments (skin integrity, edema, etc.): on vent PRVC via trach 40% FiO2 PEEP 8      Pertinent Vitals/Pain Pain Assessment Pain Assessment: Faces Faces Pain Scale: Hurts even more Pain Location: generalized with movement esp with cervical ROM and R LE ROM Pain Descriptors / Indicators: Grimacing, Discomfort Pain Intervention(s): Monitored during session    Home Living                          Prior Function            PT Goals (current goals can now be found in the care plan section) Progress  towards PT goals: Progressing toward goals    Frequency    Min 2X/week      PT Plan      Co-evaluation              AM-PAC PT 6 Clicks Mobility   Outcome Measure  Help needed turning from your back to your side while in a flat bed without using bedrails?: Total Help needed moving from lying on your back to sitting on the side of a flat bed without using bedrails?: Total Help needed moving to and from a bed to a chair (including a wheelchair)?: Total Help needed standing up from a chair using your arms (e.g., wheelchair or bedside chair)?: Total Help needed to walk in hospital room?: Total Help needed climbing 3-5 steps with a railing? : Total 6 Click Score: 6    End of Session Equipment Utilized During Treatment: Oxygen Activity Tolerance: Patient limited by pain Patient left: in bed   PT Visit Diagnosis: Muscle weakness (generalized) (M62.81);Other abnormalities of gait and mobility (R26.89);Pain Pain - part of body:  (neck, R LE)     Time: 1030-1045 PT Time Calculation (min) (ACUTE ONLY): 15 min  Charges:    $Therapeutic Activity: 8-22 mins PT General Charges $$ ACUTE PT VISIT: 1 Visit                     Micheline Portal, PT Acute Rehabilitation Services Office:[phone] 05/17/2024    Montie Portal 05/17/2024, 10:49 AM

## 2024-05-17 NOTE — Progress Notes (Signed)
 eLink Physician-Brief Progress Note Patient Name: [name] [name] DOB: 1975/10/13 MRN: [id number]   Date of Service  05/17/2024  HPI/Events of Note  Hemoglobin 6.6. no evidence of bleeding anywhere.  Known tricuspid valve endocarditis  eICU Interventions  Question lytic anemia.  Transfused 2 units PRBC, gap LDH/uric acid.     Intervention Category Intermediate Interventions: Bleeding - evaluation and treatment with blood products  Balian Schaller 05/17/2024, 9:15 PM

## 2024-05-17 NOTE — Progress Notes (Signed)
 NAME:  [name] [name], MRN:  [id number], DOB:  Oct 24, 1975, LOS: 33 ADMISSION DATE:  04/14/2024, CONSULTATION DATE:  04/14/2024 REFERRING MD:  S[place] - [facility] EDP, CHIEF COMPLAINT:  AMS  History of Present Illness:  48 year old man who presented to [facility] ED 11/2 with AMS. PMHx significant for MSSA bacteremia, MSSA PNA, infective endocarditis, cervical through lumbar osteomyelitis, psoas abscess, severe protein calorie malnutrition/FTT, substance abuse. Recent admission 10/2 - 10/13 for sepsis, widespread MSSA.  Arrived to [facility] ED covered in feces, altered and with poor airway protection; he was subsequently intubated. CXR demonstrated RLL opacities. Confirmed bilateral septic pulmonary emboli, tricuspid valve endocarditis.  Required intubation and mechanical ventilation. Clinically progressed and was transferred out of ICU 11/10.  Reconsulted 11/13 for AMS, tachypnea and tachycardia.   Pertinent Medical History:  MSSA bacteremia MSSA PNA Lumbar osteomyelitis  Psoas abscess  Severe protein calorie malnutrition FTT in adult  Significant Hospital Events: Including procedures, antibiotic start and stop dates in addition to other pertinent events   11/2 [facility] ED w AMS. Intubated. Started on vanc mero. Txf request to [place] [facility]  11/3 Extubated, developed respiratory distress requiring reintubation. Brief cardiac arrest during intubation.  11/3 Transthoracic echocardiogram > LVEF 40-45%, global hypokinesis, grade 1 diastolic dysfunction, normal RV function.  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  No mitral or aortic valve involvement 11/3 CT Chest, abdomen, pelvis >> numerous bilateral cavitary and solid nodules consistent with septic emboli, left lower lobe consolidation with a small pleural effusion, right basilar atelectasis.  Moderate free fluid in the pelvis.  Erosive changes at T5-T6, L2-L3, L4-L5 suggestive of discitis 11/3 CT Head >> no acute abnormality.   Opacified left maxillary sinus 11/10 Transferred out of ICU. 11/13 PCCM reconsulted for decreased mental status, tachycardia, tachypnea. Started diuretics 11/16 Poorly responsive, transferred back to ICU and intubated for the third time. Pressors started post intubation. Renal function worse. UOP poor.  Trach aspirate - Candida Trorpicals and Calcians 11/20 NO acute events overnight, awake and following commands.  Patient and spouse met with palliative care and collectively decision was made to proceed with trach and PEG if needed Blood culture - NEG BAL - normal flora 11/21 no acute events overnight, hemoglobin remained stable 11/23 pain, discomfort, muscle spasms 11/24 CCS trach, cortrak, off precedex  11/25 PSV 5/5 for 8hrs 11/26 - In better place mentally, motivated to continue to get better.  Thankful for care being provided. C/o of ongoing neck and back pain. Asking not to tell him when on PSV so he doesn't get more anxious Did ATC briefly Went for MRI but too much motion artifcat despite precedex  and intermitten fent/versed  and propofol  .  11/27   needs MRI again but concerns for anxiety and motion again. LE weaker than UE. Now on  ATC Trach 40%. Afebrilre. Vent overnight. C collar remains. RN sas stage 2 decub slowly progressing to stage 3.   Family request change over from klonopin  to xanax , started on methadone   11/28 - Placed on dilaudid  gtt last evening for ongoing pain, Currently rates 5/10. MRI - spine nnil acute Increaesd respioratiory secretions and o2 needs up  =-> started Ceftriaxone  and Flagyl   (Ancef  needs to be restarted 12/2 and continued through 12/16) Cutlure growing ABUNDANT SERRATA  on 05/12/24 11/30 Sputum with serratia. Trach collar all day. Day 4 methadone . PRN dilaudid  decreased.  12/1 - urology signed off - plan for long term foley with outpt urology f/u- change catheter  over wire every 30 days 12/4 PCCM asked to see as pt more difficult to wake up , transferred  to ICU for low RR and hypercarbia  Interim History/Subjective:  Awake, following commands on MV this am   Objective:   Blood pressure 120/85, pulse (!) 106, temperature 98.8 F (37.1 C), temperature source Oral, resp. rate 14, height 6' (1.829 m), weight 92.3 kg, SpO2 93%.    Vent Mode: PRVC FiO2 (%):  [40 %-50 %] 40 % Set Rate:  [14 bmp] 14 bmp Vt Set:  [620 mL] 620 mL PEEP:  [8 cmH20] 8 cmH20 Plateau Pressure:  [15 cmH20-20 cmH20] 20 cmH20   Intake/Output Summary (Last 24 hours) at 05/17/2024 1622 Last data filed at 05/17/2024 1500 Gross per 24 hour  Intake 2669.63 ml  Output 2050 ml  Net 619.63 ml   Filed Weights   05/15/24 0500 05/16/24 0500 05/17/24 0500  Weight: 90.3 kg 92.3 kg 92.3 kg   Physical Examination: General This is a chronically ill 48 year old male patient who is status post an acute and prolonged critical illness.  He is currently lying in bed, his eyes are open , follows commands HEENT c-collar in place.  Size 6 tracheostomy is midline, cuff is deflated, minimal tracheal output currently Pulmonary coarse scattered rhonchi,  Cardiac tachycardic rhythm Abdomen soft PEG in place Neuro eyes open. Follows commands. Gross motor to uppers. Can't move lower extremities    Resolved Problem List:   Postintubation hypotension Metabolic encephalopathy secondary to hypercarbia, resolved Acute  on chronic hypoxic and hypercapnic respiratory failure secondary to MSSA  ARDS (resolved), mucous plugging (improved)  and Septic pulmonary emboli  Nonoliguric AK VAP 05/10/24 - confirmed as SERRATIA 05/12/24  Assessment and Plan:   Acute on chronic hypercarbic respiratory failure likely from oversedation (BZP,methadone  and gabapentin ), 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  MSSA with Dissemination complicated by infective endocarditis of the tricuspid valve - last echo 04/15/24, lumbar osteomyelitis, Psoas abscess, septic pulmonary emboli, C-spine septic arthritis, C  Spine Osteomyelitis- functional quadriplegia CKD 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% with global hypokinesis of the left ventricle and grade 1 diastolic dysfunction History of substance abuse and chronic anxiety Urinary retention now has FC Chronic anemia Protein calorie malnutrition Stage II pressure ulcer sacrum, present on admission Deconditioning Critical illness neuromyopathy      - mentation improved today. Repeat ABG still shows CO2 of 52 , improved from yesterday - lowered sedatives doses - wean to PS and if does well to TC later today - will monitor on TC in ICU for 24-48 hrs before transfer out - continue ancef  as per ID - ABG this am, if compensated , will try PS and then TC - will give a dose of lasix  dose today, extreme third spacing - continue TF - new drop in hb, trend hb. Give 2 U PRBC- no evidence of bleeding anywhere. Keep on PPI BID - lowered methadone  and BZP - social worker/case management consultation- had LTAC consult on floor    I personally  spent 32 minutes  on this patient which included: review of medical records, nursing notes, progress notes, evaluation, interpretation of lab data and diagnostic studies, taking independent history, performing exam, documenting plan, ordering diagnostics and interventions for the following critical care issues: Acute respiratory failure with the following interventions which included: titration of ventilatory support, prevention of further deterioration

## 2024-05-17 NOTE — Plan of Care (Signed)
  Problem: Education: Goal: Knowledge of General Education information will improve Description: Including pain rating scale, medication(s)/side effects and non-pharmacologic comfort measures Outcome: Progressing   Problem: Health Behavior/Discharge Planning: Goal: Ability to manage health-related needs will improve Outcome: Progressing   Problem: Clinical Measurements: Goal: Ability to maintain clinical measurements within normal limits will improve Outcome: Progressing Goal: Will remain free from infection Outcome: Progressing Goal: Diagnostic test results will improve Outcome: Progressing Goal: Respiratory complications will improve Outcome: Progressing Goal: Cardiovascular complication will be avoided Outcome: Progressing   Problem: Pain Managment: Goal: General experience of comfort will improve and/or be controlled Outcome: Progressing   Problem: Safety: Goal: Ability to remain free from injury will improve Outcome: Progressing

## 2024-05-17 NOTE — Consult Note (Signed)
 WOC Nurse Consult Note: Reason for Consult: PI on sacrum. First consultation in 11/03, wound classified as PI Stage 2. Wound type: DTPI (Deep tissue pressure injury) Pressure Injury POA: Yes Measurement: see nursing flowsheet. Wound bed: 30% red, 10% yellow at 7 o'clock and 60% black in center. Peri-wound: macerated at 12 o'clock, peeling skin, partial intact. Scant drainage, serosanguinous.    Dressing procedure/placement/frequency: Cleanse with saline, pat dry. Apply Xeroform to the wound bed daily. Top with sacrum foam dressing. The foam can remain 3 days if no saturated or soiled. Ok to lift and change the Xeroform.  Bed nurse recommendations: - Turn and repositioning per hospital policy - If the pt is able to go in the chair, add a chair pressure retribution pad.  - Recommended air loss mattress.  WOC team will follow weekly due to worsening clinical condition. Please reconsult if further assistance is needed. Thank-you,  Lela Holm MSN, RN, CWCN, CNS.  (Phone [phone])

## 2024-05-17 NOTE — TOC Initial Note (Signed)
 Transition of Care [facility]) - Initial/Assessment Note    Patient Details  Name: [name] [name] MRN: [id number] Date of Birth: 1975-12-06  Transition of Care [facility]) CM/SW Contact:    Lauraine FORBES Saa, LCSWA Phone Number: 05/17/2024, 11:56 AM  Clinical Narrative:                  11:57 AM TOC Leadership informed TOC that they are continuing to work on LOG for [employer]. TOC will continue to follow.  Expected Discharge Plan: Long Term Acute Care (LTAC) Barriers to Discharge: Continued Medical Work up, English As A Second Language Teacher, Inadequate or no insurance   Patient Goals and CMS Choice            Expected Discharge Plan and Services   Discharge Planning Services: CM Consult Post Acute Care Choice: Long Term Acute Care (LTAC) Living arrangements for the past 2 months: Single Family Home                                      Prior Living Arrangements/Services Living arrangements for the past 2 months: Single Family Home Lives with:: Spouse Patient language and need for interpreter reviewed:: Yes        Need for Family Participation in Patient Care: Yes (Comment)     Criminal Activity/Legal Involvement Pertinent to Current Situation/Hospitalization: No - Comment as needed  Activities of Daily Living   ADL Screening (condition at time of admission) Independently performs ADLs?: No Does the patient have a NEW difficulty with bathing/dressing/toileting/self-feeding that is expected to last >3 days?: Yes (Initiates electronic notice to provider for possible OT consult) Does the patient have a NEW difficulty with getting in/out of bed, walking, or climbing stairs that is expected to last >3 days?: Yes (Initiates electronic notice to provider for possible PT consult) Does the patient have a NEW difficulty with communication that is expected to last >3 days?: Yes (Initiates electronic notice to provider for possible SLP consult) Is the patient deaf or have difficulty  hearing?: No Does the patient have difficulty seeing, even when wearing glasses/contacts?: No Does the patient have difficulty concentrating, remembering, or making decisions?: Yes  Permission Sought/Granted Permission sought to share information with : Family Supports, Oceanographer granted to share information with : No (Contact information on chart)  Share Information with NAME: Warren Benders  Permission granted to share info w AGENCY: [facility]  Permission granted to share info w Relationship: Spouse  Permission granted to share info w Contact Information: [phone]  Emotional Assessment         Alcohol / Substance Use: Not Applicable Psych Involvement: No (comment)  Admission diagnosis:  Dehydration [E86.0] AKI (acute kidney injury) [N17.9] Sepsis (HCC) [A41.9] Altered mental status, unspecified altered mental status type [R41.82] Sepsis, due to unspecified organism, unspecified whether acute organ dysfunction present [facility]) [A41.9] Severe sepsis (HCC) [A41.9, R65.20] Patient Active Problem List   Diagnosis Date Noted   MSSA (methicillin susceptible Staphylococcus aureus) infection 05/14/2024   Serratia infection 05/14/2024   Psoas abscess (HCC) 04/30/2024   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   Acute septic pulmonary embolism without acute cor pulmonale (HCC) 04/30/2024   Sepsis (HCC) 04/14/2024   Severe sepsis (HCC) 04/14/2024   Protein-calorie malnutrition, severe 03/22/2024   Septic embolism (HCC) 03/15/2024   Pressure injury of skin 03/15/2024   Lactic acidosis 03/15/2024   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  Thrombocytopenia 03/15/2024   Transaminasemia 03/15/2024   Hypoalbuminemia 03/15/2024   Failure to thrive in adult 03/15/2024   Sepsis due to pneumonia (HCC) 11/07/2016   Leukopenia 11/07/2016   Acute respiratory failure with hypoxia and hypercapnia (HCC) 11/07/2016   Anxiety disorder 11/07/2016    Hyperbilirubinemia 11/07/2016   Fever 01/18/2014   MSSA bacteremia 01/18/2014   CAP (community acquired pneumonia) 05/19/2013   Sinus tachycardia 05/19/2013   Tobacco abuse 05/19/2013   PCP:  Bertell Satterfield, MD Pharmacy:   [facility] [address], [place] - [address] [place] [address] Phone: [phone] Fax: [phone]  [id number] - [place], [place] - [address] AT [facility] [address] [place] [address] Phone: [phone] Fax: [phone]     Social Drivers of Health (SDOH) Social History: SDOH Screenings   Food Insecurity: No Food Insecurity (03/15/2024)  Housing: Low Risk  (03/15/2024)  Transportation Needs: No Transportation Needs (03/15/2024)  Utilities: Not At Risk (03/15/2024)  Tobacco Use: High Risk (05/06/2024)   SDOH Interventions: Transportation Interventions: Inpatient TOC   Readmission Risk Interventions    04/15/2024    4:16 PM 03/25/2024    9:50 AM 03/22/2024   10:01 AM  Readmission Risk Prevention Plan  Transportation Screening Complete Complete Complete  PCP or Specialist Appt within 5-7 Days Complete    Home Care Screening Complete Complete Complete  Medication Review (RN CM) Referral to Pharmacy Complete Complete

## 2024-05-18 LAB — CBC
HCT: 27 % — ABNORMAL LOW (ref 39.0–52.0)
HCT: 26.3 % — ABNORMAL LOW (ref 39.0–52.0)
Hemoglobin: 8.6 g/dL — ABNORMAL LOW (ref 13.0–17.0)
Hemoglobin: 8.3 g/dL — ABNORMAL LOW (ref 13.0–17.0)
MCH: 29.2 pg (ref 26.0–34.0)
MCH: 28.7 pg (ref 26.0–34.0)
MCHC: 31.9 g/dL (ref 30.0–36.0)
MCHC: 31.6 g/dL (ref 30.0–36.0)
MCV: 91.5 fL (ref 80.0–100.0)
MCV: 91 fL (ref 80.0–100.0)
Platelets: 185 K/uL (ref 150–400)
Platelets: 180 K/uL (ref 150–400)
RBC: 2.95 MIL/uL — ABNORMAL LOW (ref 4.22–5.81)
RBC: 2.89 MIL/uL — ABNORMAL LOW (ref 4.22–5.81)
RDW: 18.6 % — ABNORMAL HIGH (ref 11.5–15.5)
RDW: 18.4 % — ABNORMAL HIGH (ref 11.5–15.5)
WBC: 9.2 K/uL (ref 4.0–10.5)
WBC: 9 K/uL (ref 4.0–10.5)
nRBC: 0 % (ref 0.0–0.2)
nRBC: 0 % (ref 0.0–0.2)

## 2024-05-18 LAB — CBC WITH DIFFERENTIAL/PLATELET
Abs Immature Granulocytes: 0.05 K/uL (ref 0.00–0.07)
Basophils Absolute: 0 K/uL (ref 0.0–0.1)
Basophils Relative: 0 %
Eosinophils Absolute: 0 K/uL (ref 0.0–0.5)
Eosinophils Relative: 1 %
HCT: 26.6 % — ABNORMAL LOW (ref 39.0–52.0)
Hemoglobin: 8.4 g/dL — ABNORMAL LOW (ref 13.0–17.0)
Immature Granulocytes: 1 %
Lymphocytes Relative: 6 %
Lymphs Abs: 0.5 K/uL — ABNORMAL LOW (ref 0.7–4.0)
MCH: 28.8 pg (ref 26.0–34.0)
MCHC: 31.6 g/dL (ref 30.0–36.0)
MCV: 91.1 fL (ref 80.0–100.0)
Monocytes Absolute: 0.9 K/uL (ref 0.1–1.0)
Monocytes Relative: 11 %
Neutro Abs: 6.8 K/uL (ref 1.7–7.7)
Neutrophils Relative %: 81 %
Platelets: 166 K/uL (ref 150–400)
RBC: 2.92 MIL/uL — ABNORMAL LOW (ref 4.22–5.81)
RDW: 18.6 % — ABNORMAL HIGH (ref 11.5–15.5)
WBC: 8.2 K/uL (ref 4.0–10.5)
nRBC: 0 % (ref 0.0–0.2)

## 2024-05-18 LAB — COMPREHENSIVE METABOLIC PANEL WITH GFR
ALT: <5 U/L (ref 0–44)
AST: 16 U/L (ref 15–41)
Albumin: <1.5 g/dL — ABNORMAL LOW (ref 3.5–5.0)
Alkaline Phosphatase: 132 U/L — ABNORMAL HIGH (ref 38–126)
Anion gap: 12 (ref 5–15)
BUN: 117 mg/dL — ABNORMAL HIGH (ref 6–20)
CO2: 29 mmol/L (ref 22–32)
Calcium: 8.4 mg/dL — ABNORMAL LOW (ref 8.9–10.3)
Chloride: 98 mmol/L (ref 98–111)
Creatinine, Ser: 2.9 mg/dL — ABNORMAL HIGH (ref 0.61–1.24)
GFR, Estimated: 26 mL/min — ABNORMAL LOW (ref >60)
Glucose, Bld: 124 mg/dL — ABNORMAL HIGH (ref 70–99)
Potassium: 4.7 mmol/L (ref 3.5–5.1)
Sodium: 139 mmol/L (ref 135–145)
Total Bilirubin: 0.7 mg/dL (ref 0.0–1.2)
Total Protein: 6.7 g/dL (ref 6.5–8.1)

## 2024-05-18 LAB — GLUCOSE, CAPILLARY
Glucose-Capillary: 110 mg/dL — ABNORMAL HIGH (ref 70–99)
Glucose-Capillary: 102 mg/dL — ABNORMAL HIGH (ref 70–99)
Glucose-Capillary: 109 mg/dL — ABNORMAL HIGH (ref 70–99)
Glucose-Capillary: 83 mg/dL (ref 70–99)
Glucose-Capillary: 104 mg/dL — ABNORMAL HIGH (ref 70–99)

## 2024-05-18 LAB — DIC (DISSEMINATED INTRAVASCULAR COAGULATION)PANEL
D-Dimer, Quant: 2.79 ug{FEU}/mL — ABNORMAL HIGH (ref 0.00–0.50)
Fibrinogen: 637 mg/dL — ABNORMAL HIGH (ref 210–475)
INR: 1.4 — ABNORMAL HIGH (ref 0.8–1.2)
Platelets: 163 K/uL (ref 150–400)
Prothrombin Time: 18 s — ABNORMAL HIGH (ref 11.4–15.2)
Smear Review: NONE SEEN
aPTT: 56 s — AB (ref 24–36)

## 2024-05-18 LAB — PROCALCITONIN: Procalcitonin: 0.69 ng/mL

## 2024-05-18 LAB — MAGNESIUM: Magnesium: 2.3 mg/dL (ref 1.7–2.4)

## 2024-05-18 LAB — TYPE AND SCREEN
ABO/RH(D): A POS
Antibody Screen: NEGATIVE

## 2024-05-18 LAB — PHOSPHORUS: Phosphorus: 5.2 mg/dL — ABNORMAL HIGH (ref 2.5–4.6)

## 2024-05-18 LAB — C-REACTIVE PROTEIN: CRP: 14.3 mg/dL — ABNORMAL HIGH (ref <1.0)

## 2024-05-18 LAB — EXPECTORATED SPUTUM ASSESSMENT W GRAM STAIN, RFLX TO RESP C

## 2024-05-18 MED ORDER — FUROSEMIDE 10 MG/ML IJ SOLN
20.0000 mg | Freq: Once | INTRAMUSCULAR | Status: AC
  Administered 2024-05-18: 20 mg via INTRAVENOUS
  Filled 2024-05-18: qty 2

## 2024-05-18 NOTE — Plan of Care (Signed)
  Problem: Clinical Measurements: Goal: Ability to maintain clinical measurements within normal limits will improve Outcome: Progressing Goal: Will remain free from infection Outcome: Progressing Goal: Cardiovascular complication will be avoided Outcome: Progressing   Problem: Nutrition: Goal: Adequate nutrition will be maintained Outcome: Progressing   Problem: Elimination: Goal: Will not experience complications related to bowel motility Outcome: Progressing Goal: Will not experience complications related to urinary retention Outcome: Progressing   Problem: Education: Goal: Knowledge about tracheostomy care/management will improve Outcome: Progressing   Problem: Respiratory: Goal: Ability to maintain a clear airway and adequate ventilation will improve Outcome: Progressing   Problem: Role Relationship: Goal: Method of communication will improve Outcome: Progressing

## 2024-05-18 NOTE — Progress Notes (Signed)
 NAME:  [name] [name], MRN:  [id number], DOB:  1975-12-04, LOS: 34 ADMISSION DATE:  04/14/2024, CONSULTATION DATE:  04/14/2024 REFERRING MD:  Suzette - [facility] EDP, CHIEF COMPLAINT:  AMS  History of Present Illness:  48 year old man who presented to [facility] ED 11/2 with AMS. PMHx significant for MSSA bacteremia, MSSA PNA, infective endocarditis, cervical through lumbar osteomyelitis, psoas abscess, severe protein calorie malnutrition/FTT, substance abuse. Recent admission 10/2 - 10/13 for sepsis, widespread MSSA.  Arrived to [facility] ED covered in feces, altered and with poor airway protection; he was subsequently intubated. CXR demonstrated RLL opacities. Confirmed bilateral septic pulmonary emboli, tricuspid valve endocarditis.  Required intubation and mechanical ventilation. Clinically progressed and was transferred out of ICU 11/10.  Reconsulted 11/13 for AMS, tachypnea and tachycardia.   Pertinent Medical History:  MSSA bacteremia MSSA PNA Lumbar osteomyelitis  Psoas abscess  Severe protein calorie malnutrition FTT in adult  Significant Hospital Events: Including procedures, antibiotic start and stop dates in addition to other pertinent events   11/2 [facility] ED w AMS. Intubated. Started on vanc mero. Txf request to [place] to [facility]  11/3 Extubated, developed respiratory distress requiring reintubation. Brief cardiac arrest during intubation.  11/3 Transthoracic echocardiogram > LVEF 40-45%, global hypokinesis, grade 1 diastolic dysfunction, normal RV function.  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  No mitral or aortic valve involvement 11/3 CT Chest, abdomen, pelvis >> numerous bilateral cavitary and solid nodules consistent with septic emboli, left lower lobe consolidation with a small pleural effusion, right basilar atelectasis.  Moderate free fluid in the pelvis.  Erosive changes at T5-T6, L2-L3, L4-L5 suggestive of discitis 11/3 CT Head >> no acute abnormality.   Opacified left maxillary sinus 11/10 Transferred out of ICU. 11/13 PCCM reconsulted for decreased mental status, tachycardia, tachypnea. Started diuretics 11/16 Poorly responsive, transferred back to ICU and intubated for the third time. Pressors started post intubation. Renal function worse. UOP poor.  Trach aspirate - Candida Trorpicals and Calcians 11/20 NO acute events overnight, awake and following commands.  Patient and spouse met with palliative care and collectively decision was made to proceed with trach and PEG if needed Blood culture - NEG BAL - normal flora 11/21 no acute events overnight, hemoglobin remained stable 11/23 pain, discomfort, muscle spasms 11/24 CCS trach, cortrak, off precedex  11/25 PSV 5/5 for 8hrs 11/26 - In better place mentally, motivated to continue to get better.  Thankful for care being provided. C/o of ongoing neck and back pain. Asking not to tell him when on PSV so he doesn't get more anxious Did ATC briefly Went for MRI but too much motion artifcat despite precedex  and intermitten fent/versed  and propofol  .  11/27   needs MRI again but concerns for anxiety and motion again. LE weaker than UE. Now on  ATC Trach 40%. Afebrilre. Vent overnight. C collar remains. RN sas stage 2 decub slowly progressing to stage 3.   Family request change over from klonopin  to xanax , started on methadone   11/28 - Placed on dilaudid  gtt last evening for ongoing pain, Currently rates 5/10. MRI - spine nnil acute Increaesd respioratiory secretions and o2 needs up  =-> started Ceftriaxone  and Flagyl   (Ancef  needs to be restarted 12/2 and continued through 12/16) Cutlure growing ABUNDANT SERRATA  on 05/12/24 11/30 Sputum with serratia. Trach collar all day. Day 4 methadone . PRN dilaudid  decreased.  12/1 - urology signed off - plan for long term foley with outpt urology f/u- change catheter  over wire every 30 days 12/4 PCCM asked to see as pt more difficult to wake up , transferred  to ICU for low RR and hypercarbia  Interim History/Subjective:  More awake Moving arms, mouthing this morning   Objective:   Blood pressure (!) 144/96, pulse 95, temperature 98.3 F (36.8 C), temperature source Oral, resp. rate 13, height 6' (1.829 m), weight 92.3 kg, SpO2 93%.    Vent Mode: PRVC FiO2 (%):  [40 %] 40 % Set Rate:  [14 bmp] 14 bmp Vt Set:  [620 mL] 620 mL PEEP:  [8 cmH20] 8 cmH20 Plateau Pressure:  [18 cmH20-20 cmH20] 18 cmH20   Intake/Output Summary (Last 24 hours) at 05/18/2024 1005 Last data filed at 05/18/2024 0900 Gross per 24 hour  Intake 3105 ml  Output 1400 ml  Net 1705 ml   Filed Weights   05/15/24 0500 05/16/24 0500 05/17/24 0500  Weight: 90.3 kg 92.3 kg 92.3 kg   Physical Examination: General This is a chronically ill 48 year old male patient who is status post an acute and prolonged critical illness.  He is currently lying in bed, his eyes are open , follows commands HEENT c-collar in place.  Size 6 tracheostomy is midline, cuff is deflated, minimal tracheal output currently Pulmonary coarse scattered rhonchi,  Cardiac tachycardic rhythm Abdomen soft PEG in place Neuro eyes open. Follows commands. Gross motor to uppers. Can't move lower extremities    Resolved Problem List:   Postintubation hypotension Metabolic encephalopathy secondary to hypercarbia, resolved Acute  on chronic hypoxic and hypercapnic respiratory failure secondary to MSSA  ARDS (resolved), mucous plugging (improved)  and Septic pulmonary emboli  Nonoliguric AK VAP 05/10/24 - confirmed as SERRATIA 05/12/24  Assessment and Plan:   Acute on chronic hypercarbic respiratory failure likely from oversedation (BZP,methadone  and gabapentin ), 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  MSSA with Dissemination complicated by infective endocarditis of the tricuspid valve - last echo 04/15/24, lumbar osteomyelitis, Psoas abscess, septic pulmonary emboli, C-spine septic arthritis, C Spine  Osteomyelitis- functional quadriplegia CKD 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% with global hypokinesis of the left ventricle and grade 1 diastolic dysfunction History of substance abuse and chronic anxiety Urinary retention now has FC Chronic anemia Protein calorie malnutrition Stage II pressure ulcer sacrum, present on admission Deconditioning Critical illness neuromyopathy      - mentation much improved today.  - continue TC as long as pt tolerates today - will send sputum culture - will monitor on TC in ICU for 24-48 hrs before transfer out - continue ancef  as per ID - will give a dose of lasix  dose today again, extreme third spacing - continue TF - new drop in hb, trend hb.received blood overnight again. no evidence of bleeding anywhere. Keep on PPI BID. Hemodynamically stable - continue lowered methadone  and BZP dose - social worker/case management consultation- had LTAC consult on floor  Hold heparin  [place] for now   I personally  spent 32 minutes  on this patient which included: review of medical records, nursing notes, progress notes, evaluation, interpretation of lab data and diagnostic studies, taking independent history, performing exam, documenting plan, ordering diagnostics and interventions for the following critical care issues: Acute respiratory failure with the following interventions which included: titration of ventilatory support, prevention of further deterioration

## 2024-05-18 NOTE — Progress Notes (Signed)
 Pt periodically agitated and frustrated not being able to communicate or coherently/legibly write or speak concerns or needs. Pt often appears anxious and this seems to be made worse by inability to communicate. Considered placing speaking valve but patient has copious and frequent secretions. Will review ST notes about and speaking valve restrictions.  Pt continues to pull at lines and monitoring equipment not ro remove but to reposition and organize but usually end up tangling and pulling off. Pt unfastening Aspen collar and indicated he wanted it removed and cleaned. Pt refusing it to be placed back on after cleaning in soap and water . Will re engage after collar has dried.  Pt only able to lift legs off of bed with assist and is unable to situp or roll onto side without severe grimacing and bracing. This is consistent with recent PT visits and observations. Unable to get patient positioned to his satisfaction. Pt seems to be indicating that he wants to get OOB to chair but refuses use of overhead lift.  Instead Patient indicates he wants to get OOB and walk this indicates impaired and unrealistic situational knowledge of the severity and limitations of his condition.

## 2024-05-18 NOTE — Progress Notes (Signed)
 Patient currently on 40% trach collar, tolerating well. Copious secretions with oozing from stoma. Vent currently on stand by.

## 2024-05-19 ENCOUNTER — Inpatient Hospital Stay (HOSPITAL_COMMUNITY): Payer: MEDICAID

## 2024-05-19 LAB — TYPE AND SCREEN
ABO/RH(D): A POS
Antibody Screen: NEGATIVE
Unit division: 0
Unit division: 0
Unit division: 0

## 2024-05-19 LAB — COMPREHENSIVE METABOLIC PANEL WITH GFR
ALT: <5 U/L (ref 0–44)
AST: 17 U/L (ref 15–41)
Albumin: 1.6 g/dL — ABNORMAL LOW (ref 3.5–5.0)
Alkaline Phosphatase: 112 U/L (ref 38–126)
Anion gap: 14 (ref 5–15)
BUN: 112 mg/dL — ABNORMAL HIGH (ref 6–20)
CO2: 32 mmol/L (ref 22–32)
Calcium: 8.7 mg/dL — ABNORMAL LOW (ref 8.9–10.3)
Chloride: 97 mmol/L — ABNORMAL LOW (ref 98–111)
Creatinine, Ser: 2.82 mg/dL — ABNORMAL HIGH (ref 0.61–1.24)
GFR, Estimated: 27 mL/min — ABNORMAL LOW (ref >60)
Glucose, Bld: 106 mg/dL — ABNORMAL HIGH (ref 70–99)
Potassium: 4.1 mmol/L (ref 3.5–5.1)
Sodium: 143 mmol/L (ref 135–145)
Total Bilirubin: 1.1 mg/dL (ref 0.0–1.2)
Total Protein: 7.1 g/dL (ref 6.5–8.1)

## 2024-05-19 LAB — CBC WITH DIFFERENTIAL/PLATELET
Abs Immature Granulocytes: 0.07 K/uL (ref 0.00–0.07)
Basophils Absolute: 0 K/uL (ref 0.0–0.1)
Basophils Relative: 0 %
Eosinophils Absolute: 0.1 K/uL (ref 0.0–0.5)
Eosinophils Relative: 1 %
HCT: 28.1 % — ABNORMAL LOW (ref 39.0–52.0)
Hemoglobin: 8.9 g/dL — ABNORMAL LOW (ref 13.0–17.0)
Immature Granulocytes: 1 %
Lymphocytes Relative: 5 %
Lymphs Abs: 0.4 K/uL — ABNORMAL LOW (ref 0.7–4.0)
MCH: 29.2 pg (ref 26.0–34.0)
MCHC: 31.7 g/dL (ref 30.0–36.0)
MCV: 92.1 fL (ref 80.0–100.0)
Monocytes Absolute: 1.1 K/uL — ABNORMAL HIGH (ref 0.1–1.0)
Monocytes Relative: 11 %
Neutro Abs: 7.7 K/uL (ref 1.7–7.7)
Neutrophils Relative %: 82 %
Platelets: 206 K/uL (ref 150–400)
RBC: 3.05 MIL/uL — ABNORMAL LOW (ref 4.22–5.81)
RDW: 18.2 % — ABNORMAL HIGH (ref 11.5–15.5)
WBC: 9.4 K/uL (ref 4.0–10.5)
nRBC: 0 % (ref 0.0–0.2)

## 2024-05-19 LAB — CBC
HCT: 27.2 % — ABNORMAL LOW (ref 39.0–52.0)
Hemoglobin: 8.6 g/dL — ABNORMAL LOW (ref 13.0–17.0)
MCH: 29 pg (ref 26.0–34.0)
MCHC: 31.6 g/dL (ref 30.0–36.0)
MCV: 91.6 fL (ref 80.0–100.0)
Platelets: 196 K/uL (ref 150–400)
RBC: 2.97 MIL/uL — ABNORMAL LOW (ref 4.22–5.81)
RDW: 18 % — ABNORMAL HIGH (ref 11.5–15.5)
WBC: 8.7 K/uL (ref 4.0–10.5)
nRBC: 0 % (ref 0.0–0.2)

## 2024-05-19 LAB — MAGNESIUM: Magnesium: 2.5 mg/dL — ABNORMAL HIGH (ref 1.7–2.4)

## 2024-05-19 LAB — BPAM RBC
Blood Product Expiration Date: 202512272359
Blood Product Expiration Date: 202512272359
ISSUE DATE / TIME: 202512051007
ISSUE DATE / TIME: 202512052248
ISSUE DATE / TIME: 202512060132
ISSUE DATE / TIME: 202512272359
Unit Type and Rh: 202512272359
Unit Type and Rh: 202512272359
Unit Type and Rh: 6200
Unit Type and Rh: 6200
Unit Type and Rh: 6200

## 2024-05-19 LAB — GLUCOSE, CAPILLARY: Glucose-Capillary: 96 mg/dL (ref 70–99)

## 2024-05-19 LAB — EXPECTORATED SPUTUM ASSESSMENT W GRAM STAIN, RFLX TO RESP C

## 2024-05-19 LAB — C-REACTIVE PROTEIN: CRP: 12.8 mg/dL — ABNORMAL HIGH (ref <1.0)

## 2024-05-19 LAB — PHOSPHORUS: Phosphorus: 4.9 mg/dL — ABNORMAL HIGH (ref 2.5–4.6)

## 2024-05-19 LAB — PROCALCITONIN: Procalcitonin: 0.76 ng/mL

## 2024-05-19 MED ORDER — FUROSEMIDE 10 MG/ML IJ SOLN
20.0000 mg | Freq: Once | INTRAMUSCULAR | Status: AC
  Administered 2024-05-19: 20 mg via INTRAVENOUS
  Filled 2024-05-19: qty 2

## 2024-05-19 MED ORDER — ACETAMINOPHEN 160 MG/5ML PO SOLN
650.0000 mg | Freq: Four times a day (QID) | ORAL | Status: DC | PRN
  Administered 2024-05-19 – 2024-05-21 (×4): 650 mg
  Filled 2024-05-19 (×4): qty 20.3

## 2024-05-19 MED ORDER — GABAPENTIN 250 MG/5ML PO SOLN
400.0000 mg | Freq: Three times a day (TID) | ORAL | Status: DC
  Administered 2024-05-19 – 2024-05-22 (×9): 400 mg
  Filled 2024-05-19 (×12): qty 8

## 2024-05-19 MED ORDER — METHADONE HCL 10 MG PO TABS
10.0000 mg | ORAL_TABLET | Freq: Every day | ORAL | Status: DC
  Administered 2024-05-19 – 2024-05-22 (×4): 10 mg
  Filled 2024-05-19 (×4): qty 1

## 2024-05-19 MED ORDER — OXYCODONE HCL 5 MG PO TABS
5.0000 mg | ORAL_TABLET | Freq: Once | ORAL | Status: AC
  Administered 2024-05-19: 5 mg
  Filled 2024-05-19: qty 1

## 2024-05-19 MED ORDER — ALPRAZOLAM 0.5 MG PO TABS
0.5000 mg | ORAL_TABLET | Freq: Three times a day (TID) | ORAL | Status: DC
  Administered 2024-05-19 (×3): 0.5 mg via ORAL
  Filled 2024-05-19 (×3): qty 1

## 2024-05-19 MED ORDER — ALPRAZOLAM 0.5 MG PO TABS: 1.0000 mg | ORAL_TABLET | Freq: Two times a day (BID) | ORAL | Status: DC

## 2024-05-19 MED ORDER — OXYCODONE HCL 5 MG PO TABS: 5.0000 mg | ORAL_TABLET | Freq: Once | ORAL | Status: DC

## 2024-05-19 MED ORDER — ALPRAZOLAM 0.5 MG PO TABS
0.5000 mg | ORAL_TABLET | Freq: Three times a day (TID) | ORAL | Status: DC
  Administered 2024-05-20 – 2024-05-21 (×6): 0.5 mg
  Filled 2024-05-19 (×6): qty 1

## 2024-05-19 MED ORDER — OXYCODONE HCL 5 MG PO TABS
10.0000 mg | ORAL_TABLET | ORAL | Status: DC | PRN
  Administered 2024-05-19 – 2024-05-22 (×15): 10 mg
  Filled 2024-05-19 (×15): qty 2

## 2024-05-19 NOTE — Progress Notes (Signed)
 eLink Physician-Brief Progress Note Patient Name: [name] [name] DOB: 1975/07/10 MRN: [id number]   Date of Service  05/19/2024  HPI/Events of Note  Multiple bowel movements, complaining of back pain methadone  and Tylenol  are ineffective  eICU Interventions  Flexi-Seal One-time oxycodone , may need adjustment of methadone      Intervention Category Minor Interventions: Routine modifications to care plan (e.g. PRN medications for pain, fever)  Davonta Stroot 05/19/2024, 5:21 AM

## 2024-05-19 NOTE — Progress Notes (Signed)
 NAME:  [name] [name], MRN:  [id number], DOB:  02-20-1976, LOS: 35 ADMISSION DATE:  04/14/2024, CONSULTATION DATE:  04/14/2024 REFERRING MD:  Suzette - [facility] EDP, CHIEF COMPLAINT:  AMS  History of Present Illness:  48 year old man who presented to [facility] ED 11/2 with AMS. PMHx significant for MSSA bacteremia, MSSA PNA, infective endocarditis, cervical through lumbar osteomyelitis, psoas abscess, severe protein calorie malnutrition/FTT, substance abuse. Recent admission 10/2 - 10/13 for sepsis, widespread MSSA.  Arrived to [facility] ED covered in feces, altered and with poor airway protection; he was subsequently intubated. CXR demonstrated RLL opacities. Confirmed bilateral septic pulmonary emboli, tricuspid valve endocarditis.  Required intubation and mechanical ventilation. Clinically progressed and was transferred out of ICU 11/10.  Reconsulted 11/13 for AMS, tachypnea and tachycardia.   Pertinent Medical History:  MSSA bacteremia MSSA PNA Lumbar osteomyelitis  Psoas abscess  Severe protein calorie malnutrition FTT in adult  Significant Hospital Events: Including procedures, antibiotic start and stop dates in addition to other pertinent events   11/2 [facility] ED w AMS. Intubated. Started on vanc mero. Txf request to [place] to [facility]  11/3 Extubated, developed respiratory distress requiring reintubation. Brief cardiac arrest during intubation.  11/3 Transthoracic echocardiogram > LVEF 40-45%, global hypokinesis, grade 1 diastolic dysfunction, normal RV function.  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  No mitral or aortic valve involvement 11/3 CT Chest, abdomen, pelvis >> numerous bilateral cavitary and solid nodules consistent with septic emboli, left lower lobe consolidation with a small pleural effusion, right basilar atelectasis.  Moderate free fluid in the pelvis.  Erosive changes at T5-T6, L2-L3, L4-L5 suggestive of discitis 11/3 CT Head >> no acute abnormality.   Opacified left maxillary sinus 11/10 Transferred out of ICU. 11/13 PCCM reconsulted for decreased mental status, tachycardia, tachypnea. Started diuretics 11/16 Poorly responsive, transferred back to ICU and intubated for the third time. Pressors started post intubation. Renal function worse. UOP poor.  Trach aspirate - Candida Trorpicals and Calcians 11/20 NO acute events overnight, awake and following commands.  Patient and spouse met with palliative care and collectively decision was made to proceed with trach and PEG if needed Blood culture - NEG BAL - normal flora 11/21 no acute events overnight, hemoglobin remained stable 11/23 pain, discomfort, muscle spasms 11/24 CCS trach, cortrak, off precedex  11/25 PSV 5/5 for 8hrs 11/26 - In better place mentally, motivated to continue to get better.  Thankful for care being provided. C/o of ongoing neck and back pain. Asking not to tell him when on PSV so he doesn't get more anxious Did ATC briefly Went for MRI but too much motion artifcat despite precedex  and intermitten fent/versed  and propofol  .  11/27   needs MRI again but concerns for anxiety and motion again. LE weaker than UE. Now on  ATC Trach 40%. Afebrilre. Vent overnight. C collar remains. RN sas stage 2 decub slowly progressing to stage 3.   Family request change over from klonopin  to xanax , started on methadone   11/28 - Placed on dilaudid  gtt last evening for ongoing pain, Currently rates 5/10. MRI - spine nnil acute Increaesd respioratiory secretions and o2 needs up  =-> started Ceftriaxone  and Flagyl   (Ancef  needs to be restarted 12/2 and continued through 12/16) Cutlure growing ABUNDANT SERRATA  on 05/12/24 11/30 Sputum with serratia. Trach collar all day. Day 4 methadone . PRN dilaudid  decreased.  12/1 - urology signed off - plan for long term foley with outpt urology f/u- change catheter  over wire every 30 days 12/4 PCCM asked to see as pt more difficult to wake up , transferred  to ICU for low RR and hypercarbia  Interim History/Subjective:  More awake Moving arms, mouthing this morning Complained of back overnight and early morning Tolerated TC all day yesterday No more PRBC needs +700 despite 1300 UO yesterday   Objective:   Blood pressure (!) 135/109, pulse 73, temperature (!) 97.4 F (36.3 C), temperature source Axillary, resp. rate (!) 6, height 6' (1.829 m), weight 85.8 kg, SpO2 99%.    FiO2 (%):  [40 %] 40 %   Intake/Output Summary (Last 24 hours) at 05/19/2024 1109 Last data filed at 05/19/2024 0800 Gross per 24 hour  Intake 2055.33 ml  Output 900 ml  Net 1155.33 ml   Filed Weights   05/16/24 0500 05/17/24 0500 05/19/24 0100  Weight: 92.3 kg 92.3 kg 85.8 kg   Physical Examination: General This is a chronically ill 48 year old male patient who is status post an acute and prolonged critical illness.  He is currently lying in bed, his eyes are open , follows commands HEENT c-collar in place.  Size 6 tracheostomy is midline, cuff is deflated, minimal tracheal output currently Pulmonary  scattered rhonchi Cardiac tachycardic rhythm Abdomen soft PEG in place Neuro eyes open. Follows commands. Gross motor to uppers. Can't move lower extremities    Resolved Problem List:   Postintubation hypotension Metabolic encephalopathy secondary to hypercarbia, resolved Acute  on chronic hypoxic and hypercapnic respiratory failure secondary to MSSA  ARDS (resolved), mucous plugging (improved)  and Septic pulmonary emboli  Nonoliguric AK VAP 05/10/24 - confirmed as SERRATIA 05/12/24  Assessment and Plan:   Acute on chronic hypercarbic respiratory failure likely from oversedation (BZP,methadone  and gabapentin ), 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  MSSA with Dissemination complicated by infective endocarditis of the tricuspid valve - last echo 04/15/24, lumbar osteomyelitis, Psoas abscess, septic pulmonary emboli, C-spine septic arthritis, C Spine  Osteomyelitis- functional quadriplegia CKD 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% with global hypokinesis of the left ventricle and grade 1 diastolic dysfunction History of substance abuse and chronic anxiety Urinary retention now has FC Chronic anemia Protein calorie malnutrition Stage II pressure ulcer sacrum, present on admission Deconditioning Critical illness neuromyopathy   - mentation back to baseline but now is complaining of pain - will increase methadone  dose. Prn oxycodone  ordered. May benefit from duloxetine - xanax  frequency increased, increased gabapentin  slightly - continue TC - sputum culture  pending - will monitor on TC in ICU as we are increasing pain medications - continue ancef  as per ID - will give a dose of lasix  dose today again, extreme third spacing - continue TF -hb stable since yesterday. Keep on PPI BID. Hemodynamically stable - social worker/case management consultation- had LTAC consult on floor  Hold heparin   for now. Can resume tomorrow   I personally  spent 31 minutes  on this patient which included: review of medical records, nursing notes, progress notes, evaluation, interpretation of lab data and diagnostic studies, taking independent history, performing exam, documenting plan, ordering diagnostics and interventions for the following critical care issues: Acute respiratory failure with the following interventions which included: titration of ventilatory support, prevention of further deterioration

## 2024-05-19 NOTE — Progress Notes (Signed)
 Sputum sample collected and sent to lab.

## 2024-05-19 NOTE — Clinical Note (Incomplete)
 Pt pulling tubes and drains. Briefly redirectable but have several times intervened with patient removing trach collar and having fingers hooked behind trach in track ties. Pt pulling off trach split guaze. Yesterday patient pulled out PIV in LFA. Patient not adhering to safe limitations despite nodding yes to understanding that removal of lines and tubes was dangerous and delays his care.   At 1400 Patient pulled cortrak out without removing bridal. MD informed and NGT placed and order for KUB and bilat wrist restraints given

## 2024-05-20 DIAGNOSIS — D638 Anemia in other chronic diseases classified elsewhere: Secondary | ICD-10-CM

## 2024-05-20 DIAGNOSIS — J9610 Chronic respiratory failure, unspecified whether with hypoxia or hypercapnia: Secondary | ICD-10-CM

## 2024-05-20 DIAGNOSIS — L8992 Pressure ulcer of unspecified site, stage 2: Secondary | ICD-10-CM

## 2024-05-20 LAB — COMPREHENSIVE METABOLIC PANEL WITH GFR
ALT: <5 U/L (ref 0–44)
AST: 15 U/L (ref 15–41)
Albumin: <1.5 g/dL — ABNORMAL LOW (ref 3.5–5.0)
Alkaline Phosphatase: 99 U/L (ref 38–126)
Anion gap: 15 (ref 5–15)
BUN: 110 mg/dL — ABNORMAL HIGH (ref 6–20)
CO2: 31 mmol/L (ref 22–32)
Calcium: 8.4 mg/dL — ABNORMAL LOW (ref 8.9–10.3)
Chloride: 97 mmol/L — ABNORMAL LOW (ref 98–111)
Creatinine, Ser: 2.93 mg/dL — ABNORMAL HIGH (ref 0.61–1.24)
GFR, Estimated: 26 mL/min — ABNORMAL LOW (ref >60)
Glucose, Bld: 122 mg/dL — ABNORMAL HIGH (ref 70–99)
Potassium: 3.5 mmol/L (ref 3.5–5.1)
Sodium: 143 mmol/L (ref 135–145)
Total Bilirubin: 0.9 mg/dL (ref 0.0–1.2)
Total Protein: 6.6 g/dL (ref 6.5–8.1)

## 2024-05-20 LAB — GLUCOSE, CAPILLARY
Glucose-Capillary: 98 mg/dL (ref 70–99)
Glucose-Capillary: 102 mg/dL — ABNORMAL HIGH (ref 70–99)
Glucose-Capillary: 115 mg/dL — ABNORMAL HIGH (ref 70–99)
Glucose-Capillary: 106 mg/dL — ABNORMAL HIGH (ref 70–99)
Glucose-Capillary: 112 mg/dL — ABNORMAL HIGH (ref 70–99)

## 2024-05-20 LAB — CBC WITH DIFFERENTIAL/PLATELET
Abs Immature Granulocytes: 0.07 K/uL (ref 0.00–0.07)
Basophils Absolute: 0 K/uL (ref 0.0–0.1)
Basophils Relative: 1 %
Eosinophils Absolute: 0.2 K/uL (ref 0.0–0.5)
Eosinophils Relative: 2 %
HCT: 28 % — ABNORMAL LOW (ref 39.0–52.0)
Hemoglobin: 8.8 g/dL — ABNORMAL LOW (ref 13.0–17.0)
Immature Granulocytes: 1 %
Lymphocytes Relative: 6 %
Lymphs Abs: 0.5 K/uL — ABNORMAL LOW (ref 0.7–4.0)
MCH: 29.4 pg (ref 26.0–34.0)
MCHC: 31.4 g/dL (ref 30.0–36.0)
MCV: 93.6 fL (ref 80.0–100.0)
Monocytes Absolute: 1 K/uL (ref 0.1–1.0)
Monocytes Relative: 12 %
Neutro Abs: 6.6 K/uL (ref 1.7–7.7)
Neutrophils Relative %: 78 %
Platelets: 211 K/uL (ref 150–400)
RBC: 2.99 MIL/uL — ABNORMAL LOW (ref 4.22–5.81)
RDW: 17.7 % — ABNORMAL HIGH (ref 11.5–15.5)
WBC: 8.4 K/uL (ref 4.0–10.5)
nRBC: 0 % (ref 0.0–0.2)

## 2024-05-20 LAB — PROCALCITONIN: Procalcitonin: 0.61 ng/mL

## 2024-05-20 MED ORDER — JUVEN PO PACK
1.0000 | PACK | Freq: Two times a day (BID) | ORAL | Status: DC
  Administered 2024-05-21 – 2024-05-23 (×4): 1
  Filled 2024-05-20 (×4): qty 1

## 2024-05-20 MED ORDER — HEPARIN SODIUM (PORCINE) 5000 UNIT/ML IJ SOLN
5000.0000 [IU] | Freq: Three times a day (TID) | INTRAMUSCULAR | Status: DC
  Administered 2024-05-20 – 2024-05-22 (×6): 5000 [IU] via SUBCUTANEOUS
  Filled 2024-05-20 (×6): qty 1

## 2024-05-20 MED ORDER — COLLAGENASE 250 UNIT/GM EX OINT
TOPICAL_OINTMENT | Freq: Every day | CUTANEOUS | Status: DC
  Administered 2024-05-22: 1 via TOPICAL
  Filled 2024-05-20: qty 30

## 2024-05-20 MED ORDER — ORAL CARE MOUTH RINSE: 15.0000 mL | OROMUCOSAL | Status: DC | PRN

## 2024-05-20 MED ORDER — SODIUM CHLORIDE 0.9 % IV SOLN: 2.0000 g | INTRAVENOUS | Status: DC

## 2024-05-20 MED ORDER — FUROSEMIDE 10 MG/ML IJ SOLN
20.0000 mg | Freq: Once | INTRAMUSCULAR | Status: AC
  Administered 2024-05-20: 20 mg via INTRAVENOUS
  Filled 2024-05-20: qty 2

## 2024-05-20 MED ORDER — ORAL CARE MOUTH RINSE
15.0000 mL | OROMUCOSAL | Status: DC
  Administered 2024-05-20 – 2024-05-23 (×10): 15 mL via OROMUCOSAL

## 2024-05-20 MED ORDER — SODIUM CHLORIDE 0.9 % IV SOLN
3.0000 g | Freq: Four times a day (QID) | INTRAVENOUS | Status: DC
  Administered 2024-05-20 – 2024-05-22 (×9): 3 g via INTRAVENOUS
  Filled 2024-05-20 (×9): qty 8

## 2024-05-20 NOTE — Consult Note (Signed)
 WOC Nurse wound follow up Wound type: DTPI; sacrum  Measurement: 9cm x 5cm x 0.2cm  Wound bed: 50% soft black; 50% pink from 6-9 o'clock and at 3 o'clock  Drainage (amount, consistency, odor) serosanguinous  Periwound: intact  Dressing procedure/placement/frequency: Add enzymatic debridement  Low air loss mattress in place while in the ICU, added orders if transferred Consult RD for wound supplements   WOC Nurse team will follow with you and see patient within 10 days for wound assessments.  Please notify WOC nurses of any acute changes in the wounds or any new areas of concern Arali Somera [facility] MSN, RN,CWOCN, CNS, CWON-AP [phone]

## 2024-05-20 NOTE — Evaluation (Signed)
 Clinical/Bedside Swallow Evaluation Patient Details  Name: [name] [name] MRN: [id number] Date of Birth: 1976-06-03  Today's Date: 05/20/2024 Time: SLP Start Time (ACUTE ONLY): 1015 SLP Stop Time (ACUTE ONLY): 1036 SLP Time Calculation (min) (ACUTE ONLY): 21 min  Past Medical History:  Past Medical History:  Diagnosis Date   Anxiety    Back pain    Fracture of rib of right side 01/26/2015   Hypertension    Past Surgical History:  Past Surgical History:  Procedure Laterality Date   TEE WITHOUT CARDIOVERSION N/A 01/21/2014   Procedure: TRANSESOPHAGEAL ECHOCARDIOGRAM (TEE) with propofol ;  Surgeon: Dorn JULIANNA Ross, MD;  Location: [facility] ORS;  Service: Endoscopy;  Laterality: N/A;   TRACHEOSTOMY TUBE PLACEMENT N/A 05/06/2024   Procedure: CREATION, TRACHEOSTOMY;  Surgeon: Sebastian Moles, MD;  Location: [facility] OR;  Service: General;  Laterality: N/A;   HPI:  [name] [name] is a 88 yom who presented to [facility] ED 11/2 with AMS. Dx sepsis and transferred to [facility].  Intubated 11/2-3; reintubation same day; brief cardiac arrest during intubation; extubated 11/9. Reintubated 11/16; trach 11/24. Cortrak 11/24. 12/4 transferred back to ICU for low RR and hypercarbia.  Dx bilateral septic pulmonary emboli, tricuspid valve endocarditis, PMHx significant for MSSA bacteremia, MSSA PNA, infective endocarditis, cervical through lumbar osteomyelitis, psoas abscess, severe protein calorie malnutrition/FTT, substance abuse.    Assessment / Plan / Recommendation  Clinical Impression  PLAN: Pt is ready for an instrumental swallow study.  Continue NPO pending evaluation.    Pt participated in preliminary clinical swallow assessment with ice chips/ teaspoons of water  only.  PMV in place; cough improving in strength with pt able to effectively bring up oral secretions. Oral care provided.  Pt enthusiastically accepted several ice chips and teaspoons of water .  He met criteria necessary to participate in instrumental study:  his voice and cough are stronger; he is alert and interactive; he actively chewed the ice chips and initiated a consistent swallow response.  Quality of swallow and ability to start a PO diet will be measured by FEES/MBS.  D/W pt and RN.   SLP Visit Diagnosis: Dysphagia, pharyngeal phase (R13.13)    Aspiration Risk    tba   Diet Recommendation   NPO  Medication Administration: Via alternative means    Other Recommendations Oral Care Recommendations: Oral care QID     Swallow Evaluation Recommendations     Assistance Recommended at Discharge Frequent or constant Supervision/Assistance  Functional Status Assessment Patient has had a recent decline in their functional status and demonstrates the ability to make significant improvements in function in a reasonable and predictable amount of time.  Frequency and Duration min 2x/week  1 week       Prognosis Prognosis for improved oropharyngeal function: Good      Swallow Study   General Date of Onset: 04/14/24 HPI: [name] [name] is a 18 yom who presented to [facility] ED 11/2 with AMS. Dx sepsis and transferred to [facility].  Intubated 11/2-3; reintubation same day; brief cardiac arrest during intubation; extubated 11/9. Reintubated 11/16; trach 11/24. Cortrak 11/24. 12/4 transferred back to ICU for low RR and hypercarbia.  Dx bilateral septic pulmonary emboli, tricuspid valve endocarditis, PMHx significant for MSSA bacteremia, MSSA PNA, infective endocarditis, cervical through lumbar osteomyelitis, psoas abscess, severe protein calorie malnutrition/FTT, substance abuse. Type of Study: Bedside Swallow Evaluation Diet Prior to this Study: NPO;Large bore NG tube Temperature Spikes Noted: No Respiratory Status: Trach;Trach Collar Trach Size and Type: #6;Cuff;Deflated History of Recent Intubation: Yes Total  duration of intubation (days):  (multiple intubations and ultimate trach 11/24; back on vent) Date extubated:  (trach 11/24) Behavior/Cognition:  Alert;Cooperative;Pleasant mood Oral Cavity Assessment: Dried secretions Oral Care Completed by SLP: Yes Oral Cavity - Dentition: Missing dentition;Poor condition Vision: Functional for self-feeding Self-Feeding Abilities: Needs assist Patient Positioning: Partially reclined Baseline Vocal Quality: Low vocal intensity Volitional Cough: Weak Volitional Swallow: Able to elicit    Oral/Motor/Sensory Function Overall Oral Motor/Sensory Function: Within functional limits   Ice Chips Ice chips: Within functional limits   Thin Liquid Thin Liquid: Within functional limits    Nectar Thick Nectar Thick Liquid: Not tested   Honey Thick Honey Thick Liquid: Not tested   Puree Puree: Not tested   Solid     Solid: Not tested      Vona Palma Laurice 05/20/2024,11:17 AM  Palma L. Vona, MA CCC/SLP Clinical Specialist - Acute Care SLP Acute Rehabilitation Services Office number [phone]

## 2024-05-20 NOTE — Progress Notes (Signed)
 [facility] Infectious Diseases Follow Up Note  Patient Identification: Patient Name: [name] [name] MRN: [id number] Admit Date: 04/14/2024  2:45 PM Age: 48 y.o.Today's Date: 05/20/2024  Reason for Visit: Follow-up on disseminated MSSA infection  Principal Problem:   Sepsis (HCC) Active Problems:   MSSA bacteremia   Acute respiratory failure with hypoxia and hypercapnia (HCC)   Vertebral osteomyelitis (HCC)   Severe sepsis (HCC)   Psoas abscess (HCC)   Endocarditis of tricuspid valve   Acute septic pulmonary embolism without acute cor pulmonale (HCC)   MSSA (methicillin susceptible Staphylococcus aureus) infection   Serratia infection   Antibiotics: Cefazolin  12/3- Total days of antibiotics 37  Lines/Hardwares: Right arm PICC line  Interval Events: Remains afebrile Labs remarkable for creatinine elevated to 2.93, albumin  less than 1.5, hemoglobin 8.8   Assessment 48 year old male with history of disseminated MSSA infection who initially presented to [facility] with altered mental status, subsequently intubated for airway protection. Hospital course complicated by ICU admission, reintubation, cardiac arrest, native tricuspid valve endocarditis, bilateral septic pulm emboli, thoracolumbar discitis/osteomyelitis.  Transferred out of ICU on 11/10, reintubated on 11/16 for third time.  S/p trach on 11/24.  Treated with 5 days of IV ceftriaxone  for ?  Serratia pneumonia.  Transferred back to ICU on 12/4 due to difficulty waking up in the setting of polypharmacy.  # Disseminated MSSA infection including bacteremia, native tricuspid valve endocarditis with bilateral septic pulmonary emboli, C-spine septic arthritis, thoracolumbar discitis/osteomyelitis, psoas abscess  # Chronic respiratory failure/vent dependence - 12/6 trach culture with GPC and GNR in Gram stain, culture pending - Doubt this is new pneumonia given no fever, leukocytosis,  stable/decreasing oxygen requirement.  Only symptoms is increased accretions per PCCM  # Stage II-III decubitus ulcer # Anemia - in the setting of chronic illness, infection   Recommendations - Will DC IV ceftriaxone  as unlikely new pneumonia and transition to Unasyn  instead to target MSSA better.  Try to complete 5 days of Unasyn  and then transition back to cefazolin . - Monitor CBC, BMP on antibiotics  - Universal/standard isolation precautions - Will plan to see next week D/W Dr. [name]  Rest of the management as per the primary team. Thank you for the consult. Please page with pertinent questions or concerns.  ______________________________________________________________________ Subjective patient seen and examined at the bedside.   Past Medical History:  Diagnosis Date   Anxiety    Back pain    Fracture of rib of right side 01/26/2015   Hypertension    Past Surgical History:  Procedure Laterality Date   TEE WITHOUT CARDIOVERSION N/A 01/21/2014   Procedure: TRANSESOPHAGEAL ECHOCARDIOGRAM (TEE) with propofol ;  Surgeon: Dorn JULIANNA Ross, MD;  Location: [facility];  Service: Endoscopy;  Laterality: N/A;   TRACHEOSTOMY TUBE PLACEMENT N/A 05/06/2024   Procedure: CREATION, TRACHEOSTOMY;  Surgeon: Sebastian Moles, MD;  Location: [facility];  Service: General;  Laterality: N/A;   Vitals BP (!) 163/94   Pulse 94   Temp 98.2 F (36.8 C) (Oral)   Resp 12   Ht 6' (1.829 m)   Wt 87.8 kg   SpO2 97%   BMI 26.25 kg/m   Physical Exam Constitutional: Chronically ill-appearing male lying in the bed    Comments: C-collar in place  Cardiovascular:     Rate and Rhythm: Normal rate     Heart sounds:   Pulmonary:     Effort: Pulmonary effort is normal in tracheostomy, copious secretions present    Comments:   Abdominal:  Palpations: Abdomen is nondistended    Tenderness:   Musculoskeletal:        General: No swelling or tenderness in peripheral joints  Skin:    Comments: No  rashes  Neurological:     General: Awake, alert and oriented, moves all extremities  Psychiatric:        Mood and Affect: Mood normal.   Pertinent Microbiology Results for orders placed or performed during the hospital encounter of 04/14/24  Blood Culture (routine x 2)     Status: Abnormal   Collection Time: 04/14/24  3:31 PM   Specimen: Blood  Result Value Ref Range Status   Specimen Description   Final    BLOOD LEFT ANTECUBITAL Performed at [facility], [address] [place], [place] [address]    Special Requests   Final    BOTTLES DRAWN AEROBIC AND ANAEROBIC Blood Culture adequate volume Performed at [facility], [address] [place], [place] [address]    Culture  Setup Time   Final    GRAM POSITIVE COCCI IN BOTH AEROBIC AND ANAEROBIC BOTTLES CRITICAL RESULT CALLED TO, READ BACK BY AND VERIFIED WITH: PRUITT,G ON 04/15/24 AT 0102 BY PURDIE,J CRITICAL RESULT CALLED TO, READ BACK BY AND VERIFIED WITH: PHARMD M.MICHELLE AT 0743N 04/15/2024 BY T.SAAD. Performed at [facility], [address], [place], [place] [address]    Culture STAPHYLOCOCCUS AUREUS (A)  Final   Report Status 04/17/2024 FINAL  Final   Organism ID, Bacteria STAPHYLOCOCCUS AUREUS  Final      Susceptibility   Staphylococcus aureus - MIC*    CIPROFLOXACIN  <=0.5 SENSITIVE Sensitive     ERYTHROMYCIN <=0.25 SENSITIVE Sensitive     GENTAMICIN <=0.5 SENSITIVE Sensitive     OXACILLIN <=0.25 SENSITIVE Sensitive     TETRACYCLINE <=1 SENSITIVE Sensitive     VANCOMYCIN  1 SENSITIVE Sensitive     TRIMETH /SULFA  <=10 SENSITIVE Sensitive     CLINDAMYCIN  <=0.25 SENSITIVE Sensitive     RIFAMPIN <=0.5 SENSITIVE Sensitive     Inducible Clindamycin  NEGATIVE Sensitive     LINEZOLID  2 SENSITIVE Sensitive     * STAPHYLOCOCCUS AUREUS  Blood Culture ID Panel (Reflexed)     Status: Abnormal   Collection Time: 04/14/24  3:31 PM  Result Value Ref Range Status   Enterococcus faecalis NOT DETECTED NOT DETECTED Final    Enterococcus Faecium NOT DETECTED NOT DETECTED Final   Listeria monocytogenes NOT DETECTED NOT DETECTED Final   Staphylococcus species DETECTED (A) NOT DETECTED Final    Comment: CRITICAL RESULT CALLED TO, READ BACK BY AND VERIFIED WITH: PHARMD M.MICHELLE AT 0743N 04/15/2024 BY T.SAAD.    Staphylococcus aureus (BCID) DETECTED (A) NOT DETECTED Final    Comment: CRITICAL RESULT CALLED TO, READ BACK BY AND VERIFIED WITH: PHARMD M.MICHELLE AT 0743N 04/15/2024 BY T.SAAD.    Staphylococcus epidermidis NOT DETECTED NOT DETECTED Final   Staphylococcus lugdunensis NOT DETECTED NOT DETECTED Final   Streptococcus species NOT DETECTED NOT DETECTED Final   Streptococcus agalactiae NOT DETECTED NOT DETECTED Final   Streptococcus pneumoniae NOT DETECTED NOT DETECTED Final   Streptococcus pyogenes NOT DETECTED NOT DETECTED Final   A.calcoaceticus-baumannii NOT DETECTED NOT DETECTED Final   Bacteroides fragilis NOT DETECTED NOT DETECTED Final   Enterobacterales NOT DETECTED NOT DETECTED Final   Enterobacter cloacae complex NOT DETECTED NOT DETECTED Final   Escherichia coli NOT DETECTED NOT DETECTED Final   Klebsiella aerogenes NOT DETECTED NOT DETECTED Final   Klebsiella oxytoca NOT DETECTED NOT DETECTED Final  Klebsiella pneumoniae NOT DETECTED NOT DETECTED Final   Proteus species NOT DETECTED NOT DETECTED Final   Salmonella species NOT DETECTED NOT DETECTED Final   Serratia marcescens NOT DETECTED NOT DETECTED Final   Haemophilus influenzae NOT DETECTED NOT DETECTED Final   Neisseria meningitidis NOT DETECTED NOT DETECTED Final   Pseudomonas aeruginosa NOT DETECTED NOT DETECTED Final   Stenotrophomonas maltophilia NOT DETECTED NOT DETECTED Final   Candida albicans NOT DETECTED NOT DETECTED Final   Candida auris NOT DETECTED NOT DETECTED Final   Candida glabrata NOT DETECTED NOT DETECTED Final   Candida krusei NOT DETECTED NOT DETECTED Final   Candida parapsilosis NOT DETECTED NOT DETECTED Final    Candida tropicalis NOT DETECTED NOT DETECTED Final   Cryptococcus neoformans/gattii NOT DETECTED NOT DETECTED Final   Meth resistant mecA/C and MREJ NOT DETECTED NOT DETECTED Final    Comment: Performed at [facility], [address], [place], [place] [address]  Resp panel by RT-PCR (RSV, Flu A&B, Covid) Anterior Nasal Swab     Status: None   Collection Time: 04/14/24  4:07 PM   Specimen: Anterior Nasal Swab  Result Value Ref Range Status   SARS Coronavirus 2 by RT PCR NEGATIVE NEGATIVE Final    Comment: (NOTE) SARS-CoV-2 target nucleic acids are NOT DETECTED.  The SARS-CoV-2 RNA is generally detectable in upper respiratory specimens during the acute phase of infection. The lowest concentration of SARS-CoV-2 viral copies this assay can detect is 138 copies/mL. A negative result does not preclude SARS-Cov-2 infection and should not be used as the sole basis for treatment or other patient management decisions. A negative result may occur with  improper specimen collection/handling, submission of specimen other than nasopharyngeal swab, presence of viral mutation(s) within the areas targeted by this assay, and inadequate number of viral copies(<138 copies/mL). A negative result must be combined with clinical observations, patient history, and epidemiological information. The expected result is Negative.  Fact Sheet for Patients:  bloggercourse.com  Fact Sheet for Healthcare Providers:  seriousbroker.it  This test is no t yet approved or cleared by the [place]  FDA and  has been authorized for detection and/or diagnosis of SARS-CoV-2 by FDA under an Emergency Use Authorization (EUA). This EUA will remain  in effect (meaning this test can be used) for the duration of the COVID-19 declaration under Section 564(b)(1) of the Act, 21 U.S.C.section 360bbb-3(b)(1), unless the authorization is terminated  or revoked sooner.        Influenza A by PCR NEGATIVE NEGATIVE Final   Influenza B by PCR NEGATIVE NEGATIVE Final    Comment: (NOTE) The Xpert Xpress SARS-CoV-2/FLU/RSV plus assay is intended as an aid in the diagnosis of influenza from Nasopharyngeal swab specimens and should not be used as a sole basis for treatment. Nasal washings and aspirates are unacceptable for Xpert Xpress SARS-CoV-2/FLU/RSV testing.  Fact Sheet for Patients: bloggercourse.com  Fact Sheet for Healthcare Providers: seriousbroker.it  This test is not yet approved or cleared by the [place]  FDA and has been authorized for detection and/or diagnosis of SARS-CoV-2 by FDA under an Emergency Use Authorization (EUA). This EUA will remain in effect (meaning this test can be used) for the duration of the COVID-19 declaration under Section 564(b)(1) of the Act, 21 U.S.C. section 360bbb-3(b)(1), unless the authorization is terminated or revoked.     Resp Syncytial Virus by PCR NEGATIVE NEGATIVE Final    Comment: (NOTE) Fact Sheet for Patients: bloggercourse.com  Fact Sheet for Healthcare Providers: seriousbroker.it  This test is not yet approved or cleared by the [place]  FDA and has been authorized for detection and/or diagnosis of SARS-CoV-2 by FDA under an Emergency Use Authorization (EUA). This EUA will remain in effect (meaning this test can be used) for the duration of the COVID-19 declaration under Section 564(b)(1) of the Act, 21 U.S.C. section 360bbb-3(b)(1), unless the authorization is terminated or revoked.  Performed at [facility], [address] [place], [place] [address]   Urine Culture     Status: Abnormal   Collection Time: 04/14/24  4:07 PM   Specimen: Urine, Random  Result Value Ref Range Status   Specimen Description   Final    URINE, RANDOM Performed at [facility], [address],  [place], [place] [address]    Special Requests   Final    NONE Reflexed from [phone] Performed at [facility], [address], [place], [place] [address]    Culture 60,000 COLONIES/mL STAPHYLOCOCCUS AUREUS (A)  Final   Report Status 04/16/2024 FINAL  Final   Organism ID, Bacteria STAPHYLOCOCCUS AUREUS (A)  Final      Susceptibility   Staphylococcus aureus - MIC*    CIPROFLOXACIN  <=0.5 SENSITIVE Sensitive     GENTAMICIN <=0.5 SENSITIVE Sensitive     NITROFURANTOIN <=16 SENSITIVE Sensitive     OXACILLIN <=0.25 SENSITIVE Sensitive     TETRACYCLINE <=1 SENSITIVE Sensitive     VANCOMYCIN  <=0.5 SENSITIVE Sensitive     TRIMETH /SULFA  <=10 SENSITIVE Sensitive     RIFAMPIN <=0.5 SENSITIVE Sensitive     Inducible Clindamycin  NEGATIVE Sensitive     LINEZOLID  2 SENSITIVE Sensitive     * 60,000 COLONIES/mL STAPHYLOCOCCUS AUREUS  Blood Culture (routine x 2)     Status: None   Collection Time: 04/14/24  6:31 PM   Specimen: BLOOD RIGHT WRIST  Result Value Ref Range Status   Specimen Description BLOOD RIGHT WRIST BLOOD  Final   Special Requests AEROBIC BOTTLE ONLY Blood Culture adequate volume  Final   Culture   Final    NO GROWTH 5 DAYS Performed at [facility], [address], [place], [place] [address]    Report Status 04/19/2024 FINAL  Final  Culture, Respiratory w Gram Stain     Status: None   Collection Time: 04/14/24  6:51 PM   Specimen: Tracheal Aspirate; Respiratory  Result Value Ref Range Status   Specimen Description TRACHEAL ASPIRATE  Final   Special Requests NONE  Final   Gram Stain   Final    RARE WBC PRESENT, PREDOMINANTLY PMN FEW GRAM POSITIVE COCCI FEW GRAM NEGATIVE RODS    Culture   Final    FEW Normal respiratory flora-no Staph aureus or Pseudomonas seen Performed at [facility], [address] [place], [place] [address]    Report Status 04/17/2024 FINAL  Final  MRSA Next Gen by PCR, Nasal     Status: None   Collection Time: 04/14/24  8:39 PM   Specimen: Nasal  Mucosa; Nasal Swab  Result Value Ref Range Status   MRSA by PCR Next Gen NOT DETECTED NOT DETECTED Final    Comment: (NOTE) The GeneXpert MRSA Assay (FDA approved for NASAL specimens only), is one component of a comprehensive MRSA colonization surveillance program. It is not intended to diagnose MRSA infection nor to guide or monitor treatment for MRSA infections. Test performance is not FDA approved in patients less than 83 years old. Performed at [facility], [address], [place], [place]  [address]   Culture, blood (Routine X 2) w Reflex to ID Panel     Status: None   Collection Time: 04/16/24 10:10 AM   Specimen: BLOOD RIGHT HAND  Result Value Ref Range Status   Specimen Description BLOOD RIGHT HAND  Final   Special Requests   Final    BOTTLES DRAWN AEROBIC ONLY Blood Culture results may not be optimal due to an inadequate volume of blood received in culture bottles   Culture   Final    NO GROWTH 5 DAYS Performed at [facility], [address] [place], [place] [address]    Report Status 04/21/2024 FINAL  Final  Culture, blood (Routine X 2) w Reflex to ID Panel     Status: None   Collection Time: 04/16/24 10:16 AM   Specimen: BLOOD LEFT WRIST  Result Value Ref Range Status   Specimen Description BLOOD LEFT WRIST  Final   Special Requests   Final    BOTTLES DRAWN AEROBIC ONLY Blood Culture results may not be optimal due to an inadequate volume of blood received in culture bottles   Culture   Final    NO GROWTH 5 DAYS Performed at [facility], [address], [place], [place] [address]    Report Status 04/21/2024 FINAL  Final  MRSA Next Gen by PCR, Nasal     Status: None   Collection Time: 04/28/24  1:07 PM   Specimen: Nasal Mucosa; Nasal Swab  Result Value Ref Range Status   MRSA by PCR Next Gen NOT DETECTED NOT DETECTED Final    Comment: (NOTE) The GeneXpert MRSA Assay (FDA approved for NASAL specimens only), is one component of a comprehensive  MRSA colonization surveillance program. It is not intended to diagnose MRSA infection nor to guide or monitor treatment for MRSA infections. Test performance is not FDA approved in patients less than 59 years old. Performed at [facility], [address] [place], [place] [address]   Culture, Respiratory w Gram Stain     Status: None   Collection Time: 04/28/24  4:02 PM   Specimen: Tracheal Aspirate; Respiratory  Result Value Ref Range Status   Specimen Description TRACHEAL ASPIRATE  Final   Special Requests NONE  Final   Gram Stain   Final    NO WBC SEEN FEW YEAST WITH PSEUDOHYPHAE FEW BUDDING YEAST SEEN Performed at [facility], [address] [place], [place] [address]    Culture   Final    MODERATE CANDIDA TROPICALIS MODERATE CANDIDA ALBICANS    Report Status 04/30/2024 FINAL  Final  Culture, BAL-quantitative w Gram Stain     Status: Abnormal   Collection Time: 05/02/24 11:06 AM   Specimen: Bronchoalveolar Lavage; Respiratory  Result Value Ref Range Status   Specimen Description BRONCHIAL ALVEOLAR LAVAGE  Final   Special Requests Normal  Final   Gram Stain   Final    WBC PRESENT, PREDOMINANTLY PMN YEAST WITH PSEUDOHYPHAE CRITICAL RESULT CALLED TO, READ BACK BY AND VERIFIED WITH: RN JESSICA D ON 05/02/24 @ 2032 BY DRT    Culture (A)  Final    10,000 COLONIES/mL Consistent with normal respiratory flora. Performed at [facility], [address] [place], [place] [address]    Report Status 05/05/2024 FINAL  Final  Culture, blood (Routine X 2) w Reflex to ID Panel     Status: None   Collection Time: 05/02/24  7:52 PM   Specimen: BLOOD  Result Value Ref Range Status   Specimen Description BLOOD SITE NOT SPECIFIED  Final   Special Requests   Final    BOTTLES DRAWN AEROBIC ONLY Blood Culture results may not be optimal due to an inadequate volume of blood received in culture bottles   Culture   Final    NO GROWTH 5 DAYS Performed at [facility], [address], [place], [place] [address]    Report Status 05/07/2024 FINAL  Final  Culture, blood (Routine X 2) w Reflex to ID Panel     Status: None   Collection Time: 05/02/24  7:57 PM   Specimen: BLOOD  Result Value Ref Range Status   Specimen Description BLOOD SITE NOT SPECIFIED  Final   Special Requests   Final    BOTTLES DRAWN AEROBIC AND ANAEROBIC Blood Culture results may not be optimal due to an inadequate volume of blood received in culture bottles   Culture   Final    NO GROWTH 5 DAYS Performed at [facility], [address], [place], [place] [address]    Report Status 05/07/2024 FINAL  Final  Culture, Respiratory w Gram Stain     Status: None   Collection Time: 05/10/24 10:34 AM   Specimen: Tracheal Aspirate; Respiratory  Result Value Ref Range Status   Specimen Description TRACHEAL ASPIRATE  Final   Special Requests NONE  Final   Gram Stain   Final    MODERATE WBC PRESENT, PREDOMINANTLY PMN ABUNDANT GRAM NEGATIVE RODS RARE YEAST Performed at [facility], [address], [place], [place] [address]    Culture ABUNDANT SERRATIA MARCESCENS  Final   Report Status 05/14/2024 FINAL  Final   Organism ID, Bacteria SERRATIA MARCESCENS  Final      Susceptibility   Serratia marcescens - MIC*    CEFEPIME  <=0.12 SENSITIVE Sensitive     ERTAPENEM <=0.12 SENSITIVE Sensitive     CEFTRIAXONE  <=0.25 SENSITIVE Sensitive     CIPROFLOXACIN  0.12 SENSITIVE Sensitive     GENTAMICIN <=1 SENSITIVE Sensitive     MEROPENEM  0.5 SENSITIVE Sensitive     TRIMETH /SULFA  <=20 SENSITIVE Sensitive     * ABUNDANT SERRATIA MARCESCENS  MRSA Next Gen by PCR, Nasal     Status: None   Collection Time: 05/15/24 10:08 AM   Specimen: Nasal Mucosa; Nasal Swab  Result Value Ref Range Status   MRSA by PCR Next Gen NOT DETECTED NOT DETECTED Final    Comment: (NOTE) The GeneXpert MRSA Assay (FDA approved for NASAL specimens only), is one component of a comprehensive MRSA colonization  surveillance program. It is not intended to diagnose MRSA infection nor to guide or monitor treatment for MRSA infections. Test performance is not FDA approved in patients less than 7 years old. Performed at [facility], [address] [place], [place] [address]   Expectorated Sputum Assessment w Gram Stain, Rflx to Resp Cult     Status: None   Collection Time: 05/18/24  8:43 AM   Specimen: Sputum  Result Value Ref Range Status   Specimen Description SPU  Final   Special Requests NONE  Final   Sputum evaluation   Final    Sputum specimen not acceptable for testing.  Please recollect.   Performed at [facility], [address], [place], [place] [address]    Report Status 05/18/2024 FINAL  Final  Expectorated Sputum Assessment w Gram Stain, Rflx to Resp Cult     Status:  None   Collection Time: 05/18/24  9:29 PM   Specimen: Tracheal Aspirate; Sputum  Result Value Ref Range Status   Specimen Description TRACHEAL ASPIRATE  Final   Special Requests NONE  Final   Sputum evaluation   Final    THIS SPECIMEN IS ACCEPTABLE FOR SPUTUM CULTURE Performed at [facility], [address], [place], [place] [address]    Report Status 05/19/2024 FINAL  Final  Culture, Respiratory w Gram Stain     Status: None (Preliminary result)   Collection Time: 05/18/24  9:29 PM   Specimen: Tracheal Aspirate  Result Value Ref Range Status   Specimen Description TRACHEAL ASPIRATE  Final   Special Requests NONE Reflexed from [id number]  Final   Gram Stain   Final    FEW WBC PRESENT, PREDOMINANTLY PMN FEW GRAM NEGATIVE RODS RARE GRAM POSITIVE COCCI Performed at [facility], [address] [place], [place] [address]    Culture PENDING  Incomplete   Report Status PENDING  Incomplete   Pertinent Lab.    Latest Ref Rng & Units 05/20/2024    4:48 AM 05/19/2024   10:24 AM 05/19/2024    5:18 AM  CBC  WBC 4.0 - 10.5 K/uL 8.4  8.7  9.4   Hemoglobin 13.0 - 17.0 g/dL 8.8  8.6  8.9    Hematocrit 39.0 - 52.0 % 28.0  27.2  28.1   Platelets 150 - 400 K/uL 211  196  206       Latest Ref Rng & Units 05/20/2024    4:48 AM 05/19/2024    5:18 AM 05/18/2024    5:28 AM  CMP  Glucose 70 - 99 mg/dL 877  893  875   BUN 6 - 20 mg/dL 889  887  882   Creatinine 0.61 - 1.24 mg/dL 7.06  7.17  7.09   Sodium 135 - 145 mmol/L 143  143  139   Potassium 3.5 - 5.1 mmol/L 3.5  4.1  4.7   Chloride 98 - 111 mmol/L 97  97  98   CO2 22 - 32 mmol/L 31  32  29   Calcium  8.9 - 10.3 mg/dL 8.4  8.7  8.4   Total Protein 6.5 - 8.1 g/dL 6.6  7.1  6.7   Total Bilirubin 0.0 - 1.2 mg/dL 0.9  1.1  0.7   Alkaline Phos 38 - 126 U/L 99  112  132   AST 15 - 41 U/L 15  17  16    ALT 0 - 44 U/L <5  <5  <5      Pertinent Imaging today Plain films and CT images have been personally visualized and interpreted; radiology reports have been reviewed. Decision making incorporated into the Impression  DG Abd 1 View Result Date: 05/19/2024 CLINICAL DATA:  NG tube placement. EXAM: ABDOMEN - 1 VIEW COMPARISON:  None Available. FINDINGS: The bowel gas pattern is normal in the visualized mid to upper abdomen. An enteric tube terminates in the stomach and appears appropriate in position. No radio-opaque calculi or other acute radiographic abnormality are seen. IMPRESSION: NG tube is appropriate in position. Electronically Signed   By: Leita Birmingham M.D.   On: 05/19/2024 14:56   US  RENAL Result Date: 05/16/2024 EXAM: US  Retroperitoneum Complete, Renal. 05/16/2024 03:01:08 PM TECHNIQUE: Real-time ultrasonography of the retroperitoneum renal was performed. COMPARISON: None available CLINICAL HISTORY: AKI (acute kidney injury). FINDINGS: FINDINGS: RIGHT KIDNEY/URETER: Right kidney measures 12.9 x 5.9 x 7.7 cm.  Right renal volume 305 ml. 1.5 cm simple cyst of the right kidney. Small volume pararenal fluid. Normal cortical echogenicity. No hydronephrosis. No calculus. LEFT KIDNEY/URETER: Left kidney measures 11.8 x 7.0 x 6.8 cm.  Left renal volume 292 ml. Small volume pararenal fluid. Normal cortical echogenicity. No hydronephrosis. No calculus. No mass. BLADDER: Foley catheter in the urinary bladder. OTHER FINDINGS: Small volume right upper quadrant and pelvic ascites. IMPRESSION: 1. No acute sonographic abnormality to explain acute kidney injury. Electronically signed by: Franky Stanford MD 05/16/2024 08:10 PM EST RP Workstation: [id number]   DG Chest Port 1 View Result Date: 05/16/2024 EXAM: 1 VIEW(S) XRAY OF THE CHEST 05/16/2024 01:34:05 PM COMPARISON: 05/15/2024 CLINICAL HISTORY: Acute hypoxemic respiratory failure (HCC) FINDINGS: LINES, TUBES AND DEVICES: Tracheostomy tube stable in midline at clavicular level. Enteric tube in place with tip beyond inferior film margin. Stable right PICC line. LUNGS AND PLEURA: Persistent left greater than right lower lung airspace opacities. Mildly improved aeration at right lung base. Stable small right pleural effusion. No pneumothorax. HEART AND MEDIASTINUM: No acute abnormality of the cardiac and mediastinal silhouettes. BONES AND SOFT TISSUES: No acute osseous abnormality. IMPRESSION: 1. Persistent left greater than right lower lung airspace opacities. 2. Stable small right pleural effusion. 3. Mildly improved aeration at right lung base. Electronically signed by: Franky Stanford MD 05/16/2024 08:07 PM EST RP Workstation: [id number]   DG Chest Port 1 View Result Date: 05/15/2024 EXAM: 1 VIEW(S) XRAY OF THE CHEST 05/15/2024 07:13:00 AM COMPARISON: 05/12/2024 CLINICAL HISTORY: SOB (shortness of breath) FINDINGS: LINES, TUBES AND DEVICES: Tracheostomy tube in place. Enteric tube courses below diaphragm with distal tip beyond inferior margin of film. Right upper extremity PICC with tip at superior cavoatrial junction. LUNGS AND PLEURA: Increased patchy airspace opacity in left mid to lower lung zone. Increased right basilar airspace opacity. Trace right pleural effusion. No pneumothorax. HEART AND  MEDIASTINUM: No acute abnormality of the cardiac and mediastinal silhouettes. BONES AND SOFT TISSUES: No acute osseous abnormality. IMPRESSION: 1. Increased patchy airspace opacity in the left mid to lower lung zone and increased right basilar airspace opacity, compatible with multifocal airspace disease. 2. Trace right pleural effusion. Electronically signed by: Waddell Calk MD 05/15/2024 07:20 AM EST RP Workstation: [id number]   US  EKG SITE RITE Result Date: 05/12/2024 If Site Rite image not attached, placement could not be confirmed due to current cardiac rhythm.  DG CHEST PORT 1 VIEW Result Date: 05/12/2024 EXAM: 1 VIEW(S) XRAY OF THE CHEST 05/12/2024 05:51:00 AM COMPARISON: 05/06/2024 CLINICAL HISTORY: Acute and chronic respiratory failure (acute-on-chronic) (HCC) FINDINGS: LINES, TUBES AND DEVICES: Tracheostomy tube in place with tip 5.5 cm above the carina. Enteric tube in place, coursing below the hemidiaphragm with tip collimated off view. LUNGS AND PLEURA: Persistent bilateral patchy airspace opacities. Small left pleural effusion. No pneumothorax. HEART AND MEDIASTINUM: No acute abnormality of the cardiac and mediastinal silhouettes. BONES AND SOFT TISSUES: No acute osseous abnormality. IMPRESSION: 1. Persistent bilateral patchy airspace opacities. 2. Small left pleural effusion. Electronically signed by: Franky Stanford MD 05/12/2024 11:35 AM EST RP Workstation: [id number]   MR LUMBAR SPINE WO CONTRAST Result Date: 05/09/2024 EXAM: MRI LUMBAR SPINE 05/09/2024 02:55:14 PM TECHNIQUE: Multiplanar multisequence MRI of the lumbar spine was performed without the administration of intravenous contrast. COMPARISON: Thoracic MRI 05/09/2024 reported separately. CT chest, abdomen, and pelvis 05/02/2024. Lumbar MRI 04/23/2024. CLINICAL HISTORY: 48 year old male with Staph aureus bacteremia, concerning for epidural abscess. 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 FINDINGS: Transitional lumbosacral anatomy  confirmed on prior  CT. Mostly lumbarized S1 level. This is the same numbering system used on the previous MRI. Correlation with radiographs is recommended prior to any operative intervention. BONES AND ALIGNMENT: Stable vertebral height and alignment. Partially visible sacrum and SI joints appear to remain within normal limits. T12 anterior inferior endplate faint abnormal marrow signal appears new (series 5 image 10). No L1 marrow edema. No other lumbar marrow edema identified. Mildly regressed but not resolved abnormal signal at the L5-S1 disc space and endplates. Residual endplate marrow edema there is asymmetric to the right as before (series 6 image 6). The right facet remains abnormal, infected. Superimposed L3-L4 discitis osteomyelitis with moderate endplate erosion. Marrow edema at that level also mildly regressed. SPINAL CORD: Normal conus medullaris at L1-L2. SOFT TISSUES: Extensive abnormality lumbar paraspinal soft tissues with widespread myositis and muscle edema. Paraspinal phlegmon is maximal at L3-L4 and L5-S1. Medial left lower psoas muscle abscess tracks inferiorly from the L3-L4 disc space (series 8 images 27 and 32) continues into the sacral level (series 8 image 42) and measures up to 12 x 10 x 64 mm (AP by transverse by CC). This has regressed. Estimated volume: 4 mL. Contralateral septic right facet joint associated posterior paraspinal muscle abscess is 12 x 19 x 18 mm (AP by transverse by CC). This is minimally smaller. Estimated volume: 2 mL. No new paraspinal abscess identified. Partially visible retroperitoneal space edema in the abdomen and pelvis including surrounding the kidneys. Grossly stable kidneys otherwise. L3-L4: Superimposed L3-L4 discitis osteomyelitis with moderate endplate erosion. Marrow edema at that level also mildly regressed. Mild to moderate multifactorial lumbar spinal stenosis related to pathologic disc osteophyte complex with a broad based posterior component  (series 8 image 25). Moderate to severe bilateral L3 neural foraminal stenosis. These findings are stable. No other lumbar spinal stenosis. L5-S1: Mildly regressed but not resolved abnormal signal at the L5-S1 disc space and endplates. Residual endplate marrow edema there is asymmetric to the right as before (series 6 image 6). The right facet remains abnormal, infected. Moderate to severe right L5 neural foraminal stenosis related to pathologic facet and rightward disc osteophyte complex also stable. IMPRESSION: 1. Transitional lumbosacral anatomy with mostly lumbarized S1 level, same numbering used on prior Lumbar MRIs 2. Unresolved discitis/osteomyelitis at L3-L4, on the right at L5-S1, and ongoing associated right side L5-S1 septic facet joint. 3. Mild new abnormal signal at the anterior T12 inferior endplate. Difficult to exclude early New osteomyelitis at that level. Recommend attention on follow-up. 4. 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 Moderate to severe bilateral L3 and right L5 neural foraminal stenosis. 5. Extensive lumbar paraspinal edema and myositis. Mildly regressed left lower psoas muscle abscess (estimated 4 mL now) and minimally smaller right L5-S1 posterior facet joint abscess (estimated 2 mL). Electronically signed by: Helayne Hurst MD 05/09/2024 04:29 PM EST RP Workstation: [id number]   MR THORACIC SPINE WO CONTRAST Result Date: 05/09/2024 EXAM: MRI THORACIC SPINE WITHOUT INTRAVENOUS CONTRAST 05/09/2024 02:55:14 PM TECHNIQUE: Multiplanar multisequence MRI of the thoracic spine was performed without the administration of intravenous contrast. COMPARISON: Cervical spine MRI 05/08/2024. CT chest, abdomen, and pelvis 05/02/2024. Thoracic spine MRI 04/23/2024. CLINICAL HISTORY: 48 year old male with Staph aureus bacteremia. Concerning for epidural abscess. 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 FINDINGS: BONES AND ALIGNMENT: Abnormal widening and soft tissue at  the C1-C2 articulation redemonstrated. Normal thoracic segmentation on the comparison CT. Ongoing, mildly progressed STIR hyperintense marrow edema at the T5 and T6 vertebral bodies surrounding the abnormal  T5-T6 disc space. Endplate erosions at T5-T6, better demonstrated on the recent CT. No significant loss of vertebral body height at T5-T6 compared to the earlier MRI. No posterior marrow edema at T5-T6. Lower thoracic endplate osteophytosis redemonstrated. Stable and benign appearing T9 inferior endplate small focus of STIR hyperintensity. No new levels of marrow edema or infection identified. SPINAL CORD: No convincing abnormal thoracic spinal cord signal. Conus medullaris occurs below T12. SOFT TISSUES: Partially visible abnormal lungs, in part related to septic emboli. Small but lobulated bilateral pleural effusions. Partially visible retained secretions in the trachea. Negative visible upper abdominal viscera. No significant thoracic paraspinal phlegmon. No paraspinal soft tissue abscess identified. And no significant abnormality of the thoracic spine epidural space. DEGENERATIVE CHANGES: Capacious thoracic spinal canal and no spinal stenosis despite mild disc bulging and irregular endplates at some levels including T5-T6, T6-T7, T8-T9. IMPRESSION: 1. Unresolved T5T6 discitis-osteomyelitis. But despite endplate erosion, no significant loss of vertebral body height, spinal abscess, or other complicating features at this time. 2. No new levels of thoracic spinal infection on this non-contrast exam. 3. Abnormal lungs, in part related to septic emboli. Small layering and loculated pleural effusions. Electronically signed by: Helayne Hurst MD 05/09/2024 04:15 PM EST RP Workstation: [id number]   MR CERVICAL SPINE WO CONTRAST Result Date: 05/08/2024 CLINICAL DATA:  Follow-up examination for known bacteremia with spinal infection, evaluate for possible epidural abscess. EXAM: MRI CERVICAL SPINE WITHOUT CONTRAST  TECHNIQUE: Multiplanar, multisequence MR imaging of the cervical spine was performed. No intravenous contrast was administered. COMPARISON:  Comparison made with prior MRI from 04/23/2024. FINDINGS: Alignment: Examination severely degraded by motion artifact, markedly limiting assessment. Straightening of the normal cervical lordosis. No interval listhesis. Vertebrae: Persistent findings of on going infection at the skull base involving the C1-2 articulations again seen. Persistent abnormal widening of the landed dental interval up to approximately 10 mm. Basion dens interval measures up to 16 mm. Secondary slight basilar invagination at the ventral aspect of the cervicomedullary junction. Overall, appearance and alignment is relatively stable from prior. Associated prevertebral edema within the upper cervical spinal cord. No other visible new or distant areas of progressive infection elsewhere within the cervical spine on this motion degraded exam. Vertebral body height maintained. Decreased T1 signal intensity noted throughout the visualized bone marrow, nonspecific, but most commonly related to anemia, smoking or obesity. Cord: Grossly normal signal and morphology. No visible epidural abscess or other epidural involvement of infection on this motion degraded exam. Posterior Fossa, vertebral arteries, paraspinal tissues: Visualized brain and posterior fossa are grossly within normal limits. No skull base infection about the C1-2 articulations as above. Associated prevertebral edema about the upper cervical spine. Neither vertebral artery flow void well seen on this motion degraded exam. Disc levels: Underlying mild for age cervical spondylosis, grossly similar to previous. No significant stenosis. No new or progressive finding on this motion degraded exam. IMPRESSION: 1. Severely motion degraded exam, markedly limiting assessment. 2. Persistent findings of ongoing infection at the skull base involving the  atlantodental articulation. Persistent abnormal widening of the atlantodental interval up to 10 mm. Basion dens interval measures up to 16 mm. Secondary slight basilar invagination at the ventral aspect of the cervicomedullary junction. Overall, appearance and alignment is relatively stable from prior. 3. No visible epidural abscess or other epidural involvement of infection on this motion degraded exam. No other visible new or distant sites of infection. Electronically Signed   By: Morene Hoard M.D.   On: 05/08/2024 18:46  DG Abd Portable 1V Result Date: 05/06/2024 CLINICAL DATA:  Enteric catheter placement EXAM: PORTABLE ABDOMEN - 1 VIEW COMPARISON:  04/29/2024 FINDINGS: Frontal view of the lower chest and upper abdomen demonstrates enteric catheter passing below diaphragm, tip projecting over the gastric fundus. Distended gas-filled loops of small bowel in the left upper quadrant measuring up to 3.8 cm in diameter, consistent with small-bowel obstruction. Patchy bibasilar airspace disease unchanged. IMPRESSION: 1. Enteric catheter tip projecting over the gastric fundus. 2. Distended gas-filled loops of small bowel concerning for obstruction. 3. Patchy bibasilar airspace disease. Electronically Signed   By: Ozell Daring M.D.   On: 05/06/2024 21:23   DG Chest Port 1 View Result Date: 05/06/2024 EXAM: 1 VIEW(S) XRAY OF THE CHEST 05/06/2024 12:54:00 PM COMPARISON: 05/01/2024 and CT of 05/02/2024. CLINICAL HISTORY: Status post tracheostomy (HCC) FINDINGS: LINES, TUBES AND DEVICES: Removal of endotracheal and nasogastric tubes. Placement of a tracheostomy. Removal of PICC line. LUNGS AND PLEURA: Breathing mask projects over the lung apices. Given this mild limitation, no pneumothorax. Minimally improved left base consolidation. Otherwise, similar appearance of left greater than right multifocal airspace opacities and subtle nodularity. No pleural effusion. HEART AND MEDIASTINUM: No acute abnormality  of the cardiac and mediastinal silhouettes. BONES AND SOFT TISSUES: No acute osseous abnormality. IMPRESSION: 1. Minimally improved left base consolidation; otherwise, similar appearance of left greater than right multifocal pneumonia. 2. Removal of endotracheal tube with placement of tracheostomy. Electronically signed by: Rockey Kilts MD 05/06/2024 01:34 PM EST RP Workstation: [id number]   CT CHEST ABDOMEN PELVIS WO CONTRAST Result Date: 05/02/2024 CLINICAL DATA:  Bacteremia and discitis. EXAM: CT CHEST, ABDOMEN AND PELVIS WITHOUT CONTRAST TECHNIQUE: Multidetector CT imaging of the chest, abdomen and pelvis was performed following the standard protocol without IV contrast. RADIATION DOSE REDUCTION: This exam was performed according to the departmental dose-optimization program which includes automated exposure control, adjustment of the mA and/or kV according to patient size and/or use of iterative reconstruction technique. COMPARISON:  CT dated 04/15/2024. FINDINGS: Evaluation of this exam is limited in the absence of intravenous contrast and anasarca as well as due to streak artifact caused by patient's arms. CT CHEST FINDINGS Cardiovascular: There is no cardiomegaly or pericardial effusion. There is hypoattenuation of the cardiac blood pool suggestive of anemia. Clinical correlation is recommended. The thoracic aorta and central pulmonary arteries are grossly unremarkable. Mediastinum/Nodes: Evaluation of the hilar lymph nodes is very limited in the absence of intravenous contrast and due to consolidative changes of the lungs. An enteric tube noted in the esophagus. No mediastinal fluid collection. Right-sided PICC with tip over central SVC. Lungs/Pleura: There is mucous impaction of the bronchus intermedius. A large area of consolidation with volume loss involving the right middle and right lower lobe, new since the prior CT. Subsegmental consolidation of the left lower lobe. Additional bilateral confluent  airspace consolidation and several scattered cavitary lesions noted. Overall significant progression of consolidative changes compared to prior CT. Small bilateral pleural effusions with no pneumothorax. Endotracheal tube approximately 5.5 cm above the carina. Musculoskeletal: There is diffuse subcutaneous edema and anasarca. Erosive changes at T5-T6 with endplate irregularity as seen previously and may represent osteomyelitis/discitis. CT ABDOMEN PELVIS FINDINGS No intra-abdominal free air.  Small ascites. Hepatobiliary: The liver is unremarkable. No biliary dilatation. The gallbladder is unremarkable. Pancreas: The pancreas is suboptimally evaluated but grossly unremarkable. Spleen: Normal in size without focal abnormality. Adrenals/Urinary Tract: The adrenal glands unremarkable. There is no hydronephrosis or nephrolithiasis on either side. Small right renal cyst.  The urinary bladder is decompressed around a Foley catheter. Stomach/Bowel: Enteric tube with tip in the distal stomach. There is no bowel obstruction. The appendix is unremarkable. Vascular/Lymphatic: Mild aortoiliac atherosclerotic disease. The IVC is unremarkable. No portal venous gas. No obvious adenopathy. Reproductive: The prostate gland is grossly unremarkable. Other: Diffuse subcutaneous edema and anasarca. Musculoskeletal: There is erosive changes and endplate irregularity at L2-L3 and L4-L5 concerning for osteomyelitis/discitis. IMPRESSION: 1. Significant progression of consolidative changes of the lungs with several scattered cavitary lesions. 2. Mucous impaction of the bronchus intermedius with complete collapse of the right middle and right lower lobes. 3. Small bilateral pleural effusions. 4. Erosive changes at T5-T6, L2-L3, and L4-L5 concerning for osteomyelitis/discitis. 5. Small ascites and diffuse subcutaneous edema and anasarca, progressed since the prior CT. 6.  Aortic Atherosclerosis (ICD10-I70.0). Electronically Signed   By: Vanetta Chou M.D.   On: 05/02/2024 20:28   DG Chest Port 1 View Result Date: 05/01/2024 EXAM: 1 VIEW(S) XRAY OF THE CHEST 05/01/2024 06:03:16 AM COMPARISON: Yesterday. CLINICAL HISTORY: History of ETT [id number]. FINDINGS: LINES, TUBES AND DEVICES: Stable support apparatus is in appropriate position. LUNGS AND PLEURA: Bilateral lung opacities, left greater than right. No pleural effusion. No pneumothorax. HEART AND MEDIASTINUM: No acute abnormality of the cardiac and mediastinal silhouettes. BONES AND SOFT TISSUES: No acute osseous abnormality. IMPRESSION: 1. Stable bilateral lung opacities, left greater than right. Electronically signed by: Lynwood Seip MD 05/01/2024 07:21 AM EST RP Workstation: [id number]   VAS US  LOWER EXTREMITY VENOUS (DVT) Result Date: 04/30/2024  Lower Venous DVT Study Patient Name:  SADIK PIASCIK [facility]  Date of Exam:   04/30/2024 Medical Rec #: [id number]         Accession #:    [id number] Date of Birth: 1975/08/15         Patient Gender: M Patient Age:   67 years Exam Location:  [facility] Procedure:      VAS US  LOWER EXTREMITY VENOUS (DVT) Referring Phys: TORIBIO SHARPS --------------------------------------------------------------------------------  Indications: Edema.  Comparison Study: Previous exam on 04/25/2024 was negative for DVT Performing Technologist: Ezzie Potters RVT, RDMS  Examination Guidelines: A complete evaluation includes B-mode imaging, spectral Doppler, color Doppler, and power Doppler as needed of all accessible portions of each vessel. Bilateral testing is considered an integral part of a complete examination. Limited examinations for reoccurring indications may be performed as noted. The reflux portion of the exam is performed with the patient in reverse Trendelenburg.  +---------+---------------+---------+-----------+----------+--------------+ RIGHT    CompressibilityPhasicitySpontaneityPropertiesThrombus Aging  +---------+---------------+---------+-----------+----------+--------------+ CFV      Full           No       Yes                                 +---------+---------------+---------+-----------+----------+--------------+ SFJ      Full                                                        +---------+---------------+---------+-----------+----------+--------------+ FV Prox  Full           No       Yes                                 +---------+---------------+---------+-----------+----------+--------------+  FV Mid   Full           No       Yes                                 +---------+---------------+---------+-----------+----------+--------------+ FV DistalFull           No       Yes                                 +---------+---------------+---------+-----------+----------+--------------+ PFV      Full                                                        +---------+---------------+---------+-----------+----------+--------------+ POP      Full           No       Yes                                 +---------+---------------+---------+-----------+----------+--------------+ PTV      Full                                                        +---------+---------------+---------+-----------+----------+--------------+ PERO     Full                                                        +---------+---------------+---------+-----------+----------+--------------+   +---------+---------------+---------+-----------+----------+--------------+ LEFT     CompressibilityPhasicitySpontaneityPropertiesThrombus Aging +---------+---------------+---------+-----------+----------+--------------+ CFV      Full           No       Yes                                 +---------+---------------+---------+-----------+----------+--------------+ SFJ      Full                                                         +---------+---------------+---------+-----------+----------+--------------+ FV Prox  Full           No       Yes                                 +---------+---------------+---------+-----------+----------+--------------+ FV Mid   Full           No       Yes                                 +---------+---------------+---------+-----------+----------+--------------+ FV  DistalFull           No       Yes                                 +---------+---------------+---------+-----------+----------+--------------+ PFV      Full                                                        +---------+---------------+---------+-----------+----------+--------------+ POP      Full           No       Yes                                 +---------+---------------+---------+-----------+----------+--------------+ PTV      Full                                                        +---------+---------------+---------+-----------+----------+--------------+ PERO     Full                                                        +---------+---------------+---------+-----------+----------+--------------+     Summary: BILATERAL: - No evidence of deep vein thrombosis seen in the lower extremities, bilaterally. -No evidence of popliteal cyst, bilaterally. - Diffuse subcutaneous edema, bilaterally.  *See table(s) above for measurements and observations. Electronically signed by Debby Robertson on 04/30/2024 at 3:31:41 PM.    Final    DG Chest Port 1 View Result Date: 04/30/2024 EXAM: 1 VIEW(S) XRAY OF THE CHEST 04/30/2024 09:39:11 AM COMPARISON: 04/28/2024 CLINICAL HISTORY: History of ETT [id number] FINDINGS: LINES, TUBES AND DEVICES: Right PICC in place with tip in expected region of distal superior vena cava. Endotracheal tube in place with tip 4.4 cm above carina. Enteric tube in place with tip and side port below hemidiaphragm. LUNGS AND PLEURA: Bilateral ill defined nodular densities, left greater  than right. progressive left lower lobe airspace opacity. Trace left pleural effusion. No pneumothorax. HEART AND MEDIASTINUM: No acute abnormality of the cardiac and mediastinal silhouettes. BONES AND SOFT TISSUES: No acute osseous abnormality. IMPRESSION: 1. Progressive left lower lobe airspace opacity with trace left pleural effusion. 2. Bilateral ill-defined nodular pulmonary densities, left greater than right. Electronically signed by: Katheleen Faes MD 04/30/2024 10:04 AM EST RP Workstation: [id number]   DG Abd Portable 1V Result Date: 04/29/2024 CLINICAL DATA:  Feeding tube placement. EXAM: PORTABLE ABDOMEN - 1 VIEW COMPARISON:  Radiograph yesterday FINDINGS: Tip and side port of the enteric tube below the diaphragm in the stomach. Nonobstructive included bowel gas pattern. IMPRESSION: Tip and side port of the enteric tube below the diaphragm in the stomach. Electronically Signed   By: Andrea Gasman M.D.   On: 04/29/2024 13:09   DG Abd Portable 1V Result Date: 04/28/2024 EXAM: 1 VIEW XRAY OF THE ABDOMEN 04/28/2024 03:15:00 PM COMPARISON: Prior study 04/28/2024, 10:44 am. CLINICAL  HISTORY: [id number] Gastrointestinal tube present [facility]) [id number] Gastrointestinal tube present (HCC). FINDINGS: LINES, TUBES AND DEVICES: Endotracheal tube tip at the thoracic inlet. Right subclavian CVC tip mid SVC. NG tube tip below the diaphragm superimposed with the stomach. BOWEL: Nonobstructive bowel gas pattern. SOFT TISSUES: No opaque urinary calculi. BONES: No acute osseous abnormality. LUNGS AND PLEURAL SPACES: Diffuse pulmonary interstitial prominence. Bibasilar volume loss or consolidation. Small bilateral pleural effusions. IMPRESSION: 1. Diffuse pulmonary interstitial prominence, bibasilar volume loss or consolidation, and small bilateral pleural effusions. 2. Tubes and lines as described. Electronically signed by: Fonda Field MD 04/28/2024 05:53 PM EST RP Workstation: [id number] View Result  Date: 04/28/2024 CLINICAL DATA:  NGT placement. EXAM: ABDOMEN - 1 VIEW COMPARISON:  04/28/2024. FINDINGS: The bowel gas pattern is normal. An enteric tube terminates in the stomach. There small bilateral pleural effusions with atelectasis or infiltrate at the lung bases. IMPRESSION: 1. Enteric tube terminates in the stomach. 2. Small bilateral pleural effusions with atelectasis or infiltrate. Electronically Signed   By: Leita Birmingham M.D.   On: 04/28/2024 15:44   DG Abd 1 View Result Date: 04/28/2024 EXAM: 1 VIEW XRAY OF THE ABDOMEN 04/28/2024 12:07:00 PM COMPARISON: None available. CLINICAL HISTORY: Encounter for imaging study to confirm orogastric (OG) tube placement. FINDINGS: LINES, TUBES AND DEVICES: NG tube superimposed with stomach below the diaphragm left upper quadrant. BOWEL: Nonobstructive bowel gas pattern. SOFT TISSUES: No opaque urinary calculi. BONES: No acute osseous abnormality. IMPRESSION: 1. NG tube tip projects over the stomach in the left upper quadrant, below the diaphragm. Electronically signed by: Fonda Field MD 04/28/2024 12:11 PM EST RP Workstation: [id number]   DG CHEST PORT 1 VIEW Result Date: 04/28/2024 CLINICAL DATA:  Endotracheal tube evaluation and central line placement. EXAM: PORTABLE CHEST 1 VIEW COMPARISON:  04/28/2024 at 8:40 a.m. FINDINGS: Endotracheal tube with tip 6.1 cm above the carina. Nasogastric tube courses into the region of the stomach and off the image as tip is not visualized. Nasogastric tube side port projects over the gastroesophageal junction. Right-sided PICC line has tip over the SVC. Lungs are adequately inflated demonstrate persistent bilateral patchy airspace process without significant change. Suggestion of small amount of bilateral pleural fluid slightly worse. Cardiomediastinal silhouette and remainder of the exam is unchanged. IMPRESSION: 1. Persistent bilateral patchy airspace process without significant change. Suggestion of small amount  of bilateral pleural fluid slightly worse. 2. Tubes and lines as described. Nasogastric tube side port projects over the gastroesophageal junction. Electronically Signed   By: Toribio Agreste M.D.   On: 04/28/2024 11:20   DG Abd 1 View Result Date: 04/28/2024 CLINICAL DATA:  Nasogastric tube placement. EXAM: ABDOMEN - 1 VIEW COMPARISON:  KUB 04/19/2024, chest x-ray 04/26/2024 FINDINGS: Nasogastric tube is present with tip over the stomach in the left upper quadrant and side-port in the region of the gastroesophageal junction. This could be advanced approximately 5-6 cm. Several air-filled mildly dilated centralized small bowel loops are present. No free peritoneal air. Bilateral pulmonary patchy airspace process without significant change. Suggestion of mild bilateral pleural fluid. IMPRESSION: 1. Nasogastric tube with tip over the stomach in the left upper quadrant and side-port in the region of the gastroesophageal junction. This could be advanced approximately 5-6 cm. 2. Several air-filled mildly dilated centralized small bowel loops which may be due to ileus versus partial small bowel obstruction. 3. Persistent bilateral patchy airspace process with suggestion of mild bilateral pleural fluid. Electronically Signed   By: Toribio Agreste M.D.  On: 04/28/2024 11:17   DG CHEST PORT 1 VIEW Result Date: 04/28/2024 CLINICAL DATA:  Hypoxia. EXAM: PORTABLE CHEST 1 VIEW COMPARISON:  04/26/2024 FINDINGS: Lungs are adequately inflated demonstrate persistent bilateral patchy airspace process left worse than right without significant change. Possible small amount of bilateral pleural fluid. Cardiomediastinal silhouette and remainder of the exam is unchanged. IMPRESSION: Persistent bilateral patchy airspace process left worse than right without significant change. Possible small amount of bilateral pleural fluid. Electronically Signed   By: Toribio Agreste M.D.   On: 04/28/2024 09:21   DG Chest Port 1 View Result Date:  04/26/2024 CLINICAL DATA:  Hypoxia. EXAM: PORTABLE CHEST 1 VIEW COMPARISON:  04/25/2024 FINDINGS: Patchy and nodular airspace disease is seen bilaterally with areas of apparent cystic or cavitary change. The cardio pericardial silhouette is enlarged. No acute bony abnormality. Telemetry leads overlie the chest. IMPRESSION: Patchy and nodular airspace disease bilaterally with areas of apparent cystic or cavitary change. No substantial interval change. Electronically Signed   By: Camellia Candle M.D.   On: 04/26/2024 07:28   VAS US  LOWER EXTREMITY VENOUS (DVT) Result Date: 04/25/2024  Lower Venous DVT Study Patient Name:  KAIREN HALLINAN [facility]  Date of Exam:   04/25/2024 Medical Rec #: [id number]         Accession #:    [id number] Date of Birth: 12-13-1975         Patient Gender: M Patient Age:   29 years Exam Location:  [facility] Procedure:      VAS US  LOWER EXTREMITY VENOUS (DVT) Referring Phys: STEPHANIE REESE --------------------------------------------------------------------------------  Indications: Edema.  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 Comparison Study: No prior study on file Performing Technologist: Alberta Lis RVS  Examination Guidelines: A complete evaluation includes B-mode imaging, spectral Doppler, color Doppler, and power Doppler as needed of all accessible portions of each vessel. Bilateral testing is considered an integral part of a complete examination. Limited examinations for reoccurring indications may be performed as noted. The reflux portion of the exam is performed with the patient in reverse Trendelenburg.  +---------+---------------+---------+-----------+----------+--------------+ RIGHT    CompressibilityPhasicitySpontaneityPropertiesThrombus Aging +---------+---------------+---------+-----------+----------+--------------+ CFV      Full           Yes      Yes                                  +---------+---------------+---------+-----------+----------+--------------+ SFJ      Full                                                        +---------+---------------+---------+-----------+----------+--------------+ FV Prox  Full           Yes      Yes                                 +---------+---------------+---------+-----------+----------+--------------+ FV Mid   Full                                                        +---------+---------------+---------+-----------+----------+--------------+  FV DistalFull                                                        +---------+---------------+---------+-----------+----------+--------------+ PFV      Full           Yes      Yes                                 +---------+---------------+---------+-----------+----------+--------------+ POP      Full           Yes      Yes                                 +---------+---------------+---------+-----------+----------+--------------+ PTV      Full                                                        +---------+---------------+---------+-----------+----------+--------------+ PERO     Full                                                        +---------+---------------+---------+-----------+----------+--------------+ Gastroc  Full                                                        +---------+---------------+---------+-----------+----------+--------------+   +---------+---------------+---------+-----------+----------+--------------+ LEFT     CompressibilityPhasicitySpontaneityPropertiesThrombus Aging +---------+---------------+---------+-----------+----------+--------------+ CFV      Full           Yes      Yes                                 +---------+---------------+---------+-----------+----------+--------------+ SFJ      Full                                                         +---------+---------------+---------+-----------+----------+--------------+ FV Prox  Full                                                        +---------+---------------+---------+-----------+----------+--------------+ FV Mid   Full                                                        +---------+---------------+---------+-----------+----------+--------------+  FV DistalFull           Yes      Yes                                 +---------+---------------+---------+-----------+----------+--------------+ PFV      Full                                                        +---------+---------------+---------+-----------+----------+--------------+ POP      Full           Yes      Yes                                 +---------+---------------+---------+-----------+----------+--------------+ PTV      Full                                                        +---------+---------------+---------+-----------+----------+--------------+ PERO     Full                                                        +---------+---------------+---------+-----------+----------+--------------+ Gastroc  Full                                                        +---------+---------------+---------+-----------+----------+--------------+     Summary: RIGHT: - There is no evidence of deep vein thrombosis in the lower extremity. However, portions of this examination were limited- see technologist comments above.  - No cystic structure found in the popliteal fossa. Interstitial edema noted throughout the extremity  LEFT: - There is no evidence of deep vein thrombosis in the lower extremity.  - No cystic structure found in the popliteal fossa. Interstitial edema noted throughout the extremity.  *See table(s) above for measurements and observations. Electronically signed by Fonda Rim on 04/25/2024 at 4:34:13 PM.    Final    DG CHEST PORT 1 VIEW Result Date: 04/25/2024 EXAM: 1  VIEW(S) XRAY OF THE CHEST 04/25/2024 01:13:00 PM COMPARISON: CT dated 04/23/2024. CLINICAL HISTORY: Dyspnea FINDINGS: LINES, TUBES AND DEVICES: Multiple wires and leads project over the chest on the frontal radiograph. LUNGS AND PLEURA: Slightly improved left sided aeration. Increased right sided patchy airspace disease with underlying cavitary nodules. Suspicious small left pleural effusion. Mild right hemidiaphragm elevation. No pneumothorax. HEART AND MEDIASTINUM: Mild cardiomegaly. BONES AND SOFT TISSUES: No acute osseous abnormality. IMPRESSION: 1. Improved left and worsened right sided aeration. Multifocal pneumonia and cavitary nodules persist. 2. Possible  small left pleural effusion. Electronically signed by: Rockey Kilts MD 04/25/2024 01:56 PM EST RP Workstation: [id number]   CT CERVICAL SPINE WO CONTRAST Result Date: 04/25/2024 CLINICAL DATA:  Initial evaluation for neck trauma. EXAM: CT CERVICAL SPINE  WITHOUT CONTRAST TECHNIQUE: Multidetector CT imaging of the cervical spine was performed without intravenous contrast. Multiplanar CT image reconstructions were also generated. RADIATION DOSE REDUCTION: This exam was performed according to the departmental dose-optimization program which includes automated exposure control, adjustment of the mA and/or kV according to patient size and/or use of iterative reconstruction technique. COMPARISON:  Prior MRI from 04/23/2024. FINDINGS: Alignment: Examination degraded by motion artifact, limiting assessment. Straightening of the normal cervical lordosis.  No listhesis. Skull base and vertebrae: Again seen is abnormal widening of the atlantodental interval measuring up to 10 mm. [place] a an dens interval also widened up to 15 mm. Findings related to previously identified septic arthritis seen on prior MRI. Associated mild basilar invagination better appreciated on prior MRI. No other evidence for osteomyelitis discitis or septic arthritis elsewhere within the cervical  spine by CT. There is an apparent oblique lucency traversing the right articular facet of T1 (series 7, image 47). While this finding is favored to reflect a nutrient foramen and/or artifact on this motion degraded exam, a possible subtle acute nondisplaced fracture is difficult to exclude. No other visible acute fracture. Vertebral body height maintained. No worrisome osseous lesions. Soft tissues and spinal canal: Paraspinous soft tissues demonstrate no acute abnormality. Right thyroid lobe hypoplastic and/or absent. No visible epidural collections. Disc levels: No significant cervical spondylosis for age. No appreciable spinal stenosis. Upper chest: Few scattered patchy and nodular densities within the visualized lungs, likely related to known history of pneumonia and/or septic emboli. Other: None. IMPRESSION: 1. Motion degraded exam. 2. Abnormal widening of the atlantodental and basion-dens intervals secondary to acute septic arthritis, stable from prior MRI. 3. Apparent oblique lucency traversing the right articular facet of T1 as above. While this finding is favored to reflect a nutrient foramen and/or artifact on this motion degraded exam, a possible subtle acute nondisplaced fracture is difficult to exclude (has there been a traumatic event since recent MRI from 1 day prior?). Correlation with physical exam for possible pain at this location recommended. Correlation with repeat MRI could be performed for further evaluation as warranted. 4. Few scattered patchy and nodular densities within the visualized lungs, likely related to known history of pneumonia and/or septic emboli. Electronically Signed   By: Morene Hoard M.D.   On: 04/25/2024 02:30   MR CERVICAL SPINE W WO CONTRAST Addendum Date: 04/24/2024 ADDENDUM REPORT: 04/24/2024 02:22 ADDENDUM: Findings regarding the skull base infection and potential resultant craniocervical instability discussed by telephone with the nurse practitioner  covering for the patient, Stefano Horns at 1:50 a.m. on 04/24/2024. Neurosurgical consultation was recommended. Electronically Signed   By: Morene Hoard M.D.   On: 04/24/2024 02:22   Result Date: 04/24/2024 CLINICAL DATA:  Initial evaluation for bacteremia, acute neck pain. EXAM: MRI CERVICAL SPINE WITHOUT AND WITH CONTRAST TECHNIQUE: Multiplanar and multiecho pulse sequences of the cervical spine, to include the craniocervical junction and cervicothoracic junction, were obtained without and with intravenous contrast. CONTRAST:  7mL GADAVIST  GADOBUTROL  1 MMOL/ML IV SOLN COMPARISON:  None Available. FINDINGS: Alignment: Examination degraded by motion artifact. Straightening with slight reversal of the normal cervical lordosis. No listhesis. Vertebrae: Abnormal widening of the atlantodental interval which measures up to 10 mm in diameter. Enhancing soft tissue within the expanded atlantodental space, extending towards the basion. The basion dens interval is also abnormally widened up to 16 mm. Secondary mild basilar invagination of the dens into the ventral brainstem. Finding concerning for acute infection with septic arthritis. Associated intra-articular fluid collection  at the left aspect of the C1-2 articulation measures up to 2.1 cm (series 21, image 9). No significant marrow edema within the adjacent osseous structures to suggest associated osteomyelitis. No other evidence for acute infection elsewhere within the cervical spine. Vertebral body height maintained without acute or chronic fracture. Decreased T1 signal intensity noted throughout the visualized bone marrow, nonspecific, but most commonly related to anemia, smoking or obesity. No worrisome osseous lesions. No other abnormal marrow edema or enhancement. Cord: Normal signal and morphology. Mild epidural enhancement adjacent to the infected C1-2 level, but no epidural abscess or collection. Posterior Fossa, vertebral arteries, paraspinal tissues:  Visualized brain and posterior fossa otherwise unremarkable. Craniocervical junction remains patent. Edema and enhancement at the skull base about the infected atlantodental articulation. Paraspinous soft tissues demonstrate no other acute finding. Normal flow voids seen within the vertebral arteries bilaterally. Disc levels: No significant disc pathology seen within the cervical spine for age. No significant disc bulge or focal disc herniation. No significant facet disease. No canal or neural foraminal stenosis. No neural impingement. IMPRESSION: 1. Findings consistent with acute septic arthritis involving the atlantodental articulation at the skull base. Secondary abnormal widening of the atlantodental and basion-dens intervals with mild basilar invagination as above. Superimposed 2.1 cm intra-articular fluid collection at the left aspect of the atlantodental articulation. 2. No other evidence for acute infection elsewhere within the cervical spine. 3. No significant disc pathology for age. No stenosis or neural impingement. Electronically Signed: By: Morene Hoard M.D. On: 04/24/2024 01:23   MR Lumbar Spine W Wo Contrast Result Date: 04/24/2024 CLINICAL DATA:  Initial evaluation for acute back pain, bacteremia. EXAM: MRI LUMBAR SPINE WITHOUT AND WITH CONTRAST TECHNIQUE: Multiplanar and multiecho pulse sequences of the lumbar spine were obtained without and with intravenous contrast. CONTRAST:  7mL GADAVIST  GADOBUTROL  1 MMOL/ML IV SOLN COMPARISON:  Comparison made with prior MRI from 03/15/2024. FINDINGS: Segmentation: Transitional features about the lumbosacral junction. For the purposes of this dictation, the same numbering system utilized on prior exam is employed on this exam. Therefore, there is a partially lumbarized S1 segment with rudimentary S1-2 interspace. Alignment: Mild levoscoliosis. Alignment otherwise normal preservation of the normal lumbar lordosis. Trace retrolisthesis of L3 on L4,  stable. Vertebrae: Persistent findings of osteomyelitis discitis at L3-4. There has been progressive endplate irregularity and fragmentation, with slight vertebral body height loss since previous. Mild epidural enhancement within the adjacent ventral epidural space without discrete epidural abscess. Associated paraspinous inflammatory changes involving the left greater than right psoas musculature. Superimposed abscess within the left psoas measures approximately 2.1 x 1.7 x 8.3 cm (series 8, image 20). Persistent findings of osteomyelitis discitis at L5-S1, greater on the right. Mildly progressive endplate irregularity and enhancement without significant interval vertebral collapse. No significant epidural involvement at this level. Edema and enhancement about the right L5-S1 facet, consistent with ongoing sub septic arthritis, progressed from prior. Associated loculated soft tissue abscess just posterior to the infected right L5-S1 facet measures 1.9 x 1.4 x 2.0 cm (series 12, image 24). Abnormal edema and enhancement also seen about the left L3-4 facet, also consistent with septic arthritis, present on prior exam in retrospect. Associated 1.1 cm soft tissue abscess within the adjacent posterior paraspinous soft tissues (series 12, image 12). No other new sites of infection elsewhere within the lumbar spine. Vertebral body height otherwise maintained. Diffusely decreased T1 signal intensity throughout the visualized bone marrow, nonspecific, but most commonly related to anemia, smoking, or obesity. No worrisome osseous lesions.  Conus medullaris and cauda equina: Conus extends to the L1-2 level. Conus and cauda equina appear normal. Paraspinal and other soft tissues: Paraspinous inflammatory changes as detailed above. Disc levels: L1-2:  Unremarkable. L2-3: Disc desiccation with mild disc bulge. No significant spinal stenosis. Foramina remain patent. L3-4: Findings related to osteomyelitis discitis with associated  intervertebral disc space height loss, endplate irregularity, and spurring. Underlying diffuse disc bulge, asymmetric to the left. Mild bilateral facet hypertrophy. No significant spinal stenosis. Moderate bilateral L3 foraminal narrowing. L4-5: Disc desiccation with minimal annular disc bulge. Superimposed right foraminal disc protrusion closely approximates the right L4 nerve root. Mild left facet hypertrophy. No significant spinal stenosis. Foramina remain patent. L5-S1: Ongoing findings of osteomyelitis discitis. Underlying mild disc bulge with endplate spurring. Right-sided septic arthritis with underlying facet degeneration. No significant spinal stenosis. Moderate bilateral L5 foraminal narrowing. S1-2: Rudimentary S1-2 interspace. No disc bulge or focal disc herniation. Minimal facet degeneration. No stenosis. IMPRESSION: 1. Persistent findings of osteomyelitis discitis at L3-4 and L5-S1, mildly progressed as compared to previous MRI from 03/15/2024. Associated paraspinous inflammatory changes with superimposed left psoas abscess as above. No significant epidural involvement at this time. 2. Septic arthritis involving the right L5-S1 facet, progressed from prior. Associated 2 cm soft tissue abscess within the adjacent posterior paraspinous soft tissues. 3. Septic arthritis involving the left L3-4 facet, present on prior exam in retrospect. Associated 1.1 cm soft tissue abscess within the adjacent posterior paraspinous soft tissues. 4. Underlying multilevel degenerative spondylosis and facet arthrosis as above. No significant spinal stenosis. Moderate bilateral L3 and L5 foraminal narrowing. 5. Transitional lumbosacral anatomy. Careful correlation with numbering system on this exam recommended prior to any potential future intervention. Electronically Signed   By: Morene Hoard M.D.   On: 04/24/2024 02:18   MR THORACIC SPINE W WO CONTRAST Result Date: 04/24/2024 CLINICAL DATA:  Initial evaluation  for bacteremia, acute back pain. EXAM: MRI THORACIC WITHOUT AND WITH CONTRAST TECHNIQUE: Multiplanar and multiecho pulse sequences of the thoracic spine were obtained without and with intravenous contrast. CONTRAST:  7mL GADAVIST  GADOBUTROL  1 MMOL/ML IV SOLN COMPARISON:  Comparison made with CT from 04/14/2024. FINDINGS: Alignment:  Examination degraded by motion artifact. Mild dextroscoliosis. Alignment otherwise normal preservation of the normal thoracic kyphosis. No listhesis. Vertebrae: Vertebral body height maintained without acute or chronic fracture. Diffusely decreased T1 signal intensity noted throughout the visualized bone marrow, nonspecific, but most commonly related to anemia, smoking or obesity. No worrisome osseous lesions. Intervertebral disc space height loss with fluid signal intensity within the T5-6 interspace. Marrow edema and enhancement within the adjacent endplates with mild endplate irregularity. Findings consistent with acute osteomyelitis discitis at this level. No significant epidural or paraspinous involvement. No other evidence for acute infection elsewhere within the thoracic spine. Cord: Grossly normal signal and morphology. No visible cord signal changes on this motion degraded exam. No epidural abscess. No abnormal enhancement. Paraspinal and other soft tissues: Mild diffuse edema within the visualized paraspinous soft tissues, likely related to overall volume status. No loculated collections. Small layering left pleural effusion. Consolidative opacity within the partially visualized lungs, concerning for multifocal pneumonia. Disc levels: T6-7: Small left paracentral disc protrusion without stenosis. T8-9: Small left paracentral disc protrusion without stenosis. Otherwise, no other significant disc pathology seen within the thoracic spine. No stenosis or IMPRESSION: 1. Findings consistent with acute osteomyelitis discitis at T5-6. No significant epidural or paraspinous involvement  at this time. 2. Consolidative opacities within the partially visualized lungs, concerning for multifocal pneumonia.  Associated small left pleural effusion. 3. Small disc protrusions at T6-7 and T8-9 without stenosis or impingement. Electronically Signed   By: Morene Hoard M.D.   On: 04/24/2024 01:43   DG CHEST PORT 1 VIEW Result Date: 04/23/2024 CLINICAL DATA:  Dyspnea. EXAM: PORTABLE CHEST 1 VIEW COMPARISON:  04/19/2024 FINDINGS: The cardio pericardial silhouette is enlarged. Interval progression of patchy airspace disease in the left lung, now with confluent areas in the left mid and retrocardiac left base. Subtle patchy airspace disease again noted right lung. No substantial pleural effusion. No acute bony abnormality. IMPRESSION: Interval progression of patchy airspace disease in the left lung, now with confluent areas in the left mid lung and retrocardiac left base. Electronically Signed   By: Camellia Candle M.D.   On: 04/23/2024 12:16   I spent 50 minutes involved in face-to-face and non-face-to-face activities for this patient on the day of the visit. Professional time spent includes the following activities: Preparing to see the patient (review of tests), Obtaining and reviewing separately obtained history (PCCM progress note), Performing a medically appropriate examination and evaluation , Ordering medications, referring and communicating with other health care professionals including Dr. [name], Documenting clinical information in the EMR, Independently interpreting results (not separately reported), and Care coordination (not separately reported).   Plan d/w requesting provider as well as ID pharm D  Of note, portions of this note may have been created with voice recognition software. While this note has been edited for accuracy, occasional wrong-word or 'sound-a-like' substitutions may have occurred due to the inherent limitations of voice recognition software.   Electronically signed  by:   Annalee Orem, MD Infectious Disease Physician [facility] Pager: [phone]

## 2024-05-20 NOTE — Progress Notes (Signed)
 Spoke with family, [name] [name], who is patient's spouse.  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  He is able to manage his own finances.  He was doing this before all this happened.  Per spouse, patient uses his dad's retirement money for financial support.  I talked to patient at bedside today after putting on Passey muir valve.  He reports he does manage his own finances.  He does not want a car, but he is able to pay to keep his lights on.  He uses his family for transportation around the area.  I do think patient is able to manage his own finances and should be able to sign any paperwork at this time.

## 2024-05-20 NOTE — Progress Notes (Signed)
 Speech Language Pathology Treatment: Jeanett Due Speaking valve  Patient Details Name: [name] [name] MRN: [id number] DOB: 05/26/1976 Today's Date: 05/20/2024 Time: 9044-8984 SLP Time Calculation (min) (ACUTE ONLY): 20 min  Assessment / Plan / Recommendation Clinical Impression  PLAN: PMV whenever pt has full supervision available.  Remove valve when left alone.  [name] [name] is doing much better with PMV toleration. He wore valve for 40 minutes (including during swallowing assessment), maintaining stable vital signs.  His vocal quality is improved. With valve in place, able to expectorate secretions orally.  At this time, barrier to using valve all day is b/c he remains in bilateral restraints (pulled out NG this weekend).  He would need to be able to self-suction when left alone in room with valve on.    Goal is to work toward independence with valve use.  SLP will follow.    HPI HPI: [name] [name] is a 71 yom who presented to [facility] ED 11/2 with AMS. Dx sepsis and transferred to [facility].  Intubated 11/2-3; reintubation same day; brief cardiac arrest during intubation; extubated 11/9. Reintubated 11/16; trach 11/24. Cortrak 11/24. Increasing time on TC. Dx bilateral septic pulmonary emboli, tricuspid valve endocarditis, PMHx significant for MSSA bacteremia, MSSA PNA, infective endocarditis, cervical through lumbar osteomyelitis, psoas abscess, severe protein calorie malnutrition/FTT, substance abuse.      SLP Plan  Continue with current plan of care                 Recommendations         Patient may use Passy-Muir Speech Valve: During all therapies with supervision PMSV Supervision: Full           Oral care QID   Frequent or constant Supervision/Assistance Aphonia (R49.1)     Continue with current plan of care    [name] [name] L. Vona, MA CCC/SLP Clinical Specialist - Acute Care SLP Acute Rehabilitation Services Office number [phone]  [name] [name]  [name] [name]  05/20/2024, 10:58 AM

## 2024-05-20 NOTE — Progress Notes (Signed)
 Nutrition Follow-up  DOCUMENTATION CODES:  Severe malnutrition in context of social or environmental circumstances  INTERVENTION:  Continue the following: Nepro Carb Steady at 50ml/hr (1200ml per day)  Free water  flushes 100ml q8h (300ml per day) Provides 2124 kcal, 97g protein, 1172 ml free water  daily (TF+flush) Monitor for SLP recommendations following tentative swallow study later in the week. Add 1 packet Juven BID, each packet provides 95 calories, 2.5 grams of protein (collagen), and 9.8 grams of carbohydrate (3 grams sugar); also contains 7 grams of L-arginine and L-glutamine, 300 mg vitamin C , 15 mg vitamin E, 1.2 mcg vitamin B-12, 9.5 mg zinc , 200 mg calcium , and 1.5 g  Calcium  Beta-hydroxy-Beta-methylbutyrate to support wound healing   NUTRITION DIAGNOSIS:  Severe Malnutrition related to social / environmental circumstances (IVDU and alcohol  abuse) as evidenced by severe fat depletion, severe muscle depletion. - remains applicable  GOAL:  Patient will meet greater than or equal to 90% of their needs - progressing, TF meets needs  MONITOR:  Vent status, TF tolerance, Labs  REASON FOR ASSESSMENT:  Consult, Ventilator Enteral/tube feeding initiation and management  ASSESSMENT:  Pt admitted with metabolic encephalopathy and presumed septic shock d/t metastatic MSSA infection with concern for endocarditis. PMH significant for recent admission (10/2-10/13 leaving AMA) for MSSA, PNA, psoas abscesses and lumbar osteomyelitis, tobacco use, IVDU and alcohol  use  11/2: admitted, transferred from [facility] to [facility] 11/3: extubated; respiratory distress; re-intubated, cardiac arrest, ROSC; echo>LVEF 40-45%, normal RV function 11/7: cortrak placed 11/6: extubated; diet advanced to regular 11/10: transfer out of ICU 11/16: AMS, tachypnea, tachycardic; re-intubated d/t respiratory failure; TF started 11/20: s/p bronchoscopy; PMT meeting- full scope, ok with trach/PEG; CT chest -extensive  atelectasis, increasing areas of cavitation, pleural effusions, erosive changes at T5-T6, L2-L3, and L4-L5 concerning for osteomyelitis/discitis 11/24: s/p trach placement; Cortrak placed 12/03: transfer out of ICU 12/04: change TF formula to Nepro, back to ICU 12/7 - pt pulled cortrak, NGT placed 12/8 SLP BSE, doing well with PMV and planning for instrumental swallow study  Pt resting in bed at the time of assessment. Calm at the moment but RN reports that he has been impulsive and restless at times. Requesting pain medication often. Pt pulled out his cortrak yesterday but NGT was replaced and is currently in place with TF infusing at goal.   Discussed in rounds, MD declines replacing cortrak at this time as SLP plans to do either MBS or FEES this week and is hopeful that diet will be advanced. Pt has required enteral nutrition all of this admission aside from ~1 week. Has had several cortraks. If unable to place on diet, PEG may be more appropriate at this point.   TOC working on LOG for [employer] placement.   Admit weight: 64.4 kg, first measured  Current weight: 87.8 kg    Intake/Output Summary (Last 24 hours) at 05/20/2024 1917 Last data filed at 05/20/2024 1827 Gross per 24 hour  Intake 1564.78 ml  Output 3350 ml  Net -1785.22 ml  Net IO Since Admission: 10,699.8 mL [05/20/24 1917]  Drains/Lines: PICC, single lumen NGT, right nare, gastric Trach 6mm Shiley, cuffed UOP x 24 hours  Nutritionally Relevant Medications: Scheduled Meds:  folic acid   1 mg Per Tube Daily   free water   100 mL Per Tube Q8H   furosemide   20 mg Intravenous Once   methadone   10 mg Per Tube Daily   multivitamin with minerals  1 tablet Per Tube Daily   pantoprazole  (PROTONIX )  40 mg Intravenous Q12H   polyethylene glycol  17 g Per Tube BID   sennosides  10 mL Per Tube BID   Continuous Infusions:  cefTRIAXone  (ROCEPHIN )  IV     feeding supplement (NEPRO CARB STEADY) 50 mL/hr at 05/19/24 1500    PRN Meds: ondansetron  IV  Labs Reviewed: Chloride 97 BUN 110, creatinine 2.93 CBG ranges from 96-115 mg/dL over the last 24 hours HgbA1c 4.6% (04/25/24)  NUTRITION - FOCUSED PHYSICAL EXAM: Flowsheet Row Most Recent Value  Orbital Region Unable to assess  Upper Arm Region Severe depletion  Thoracic and Lumbar Region Severe depletion  Buccal Region Unable to assess  Temple Region Severe depletion  Clavicle Bone Region Severe depletion  Clavicle and Acromion Bone Region Severe depletion  Scapular Bone Region Severe depletion  Dorsal Hand Unable to assess  [edema]  Patellar Region Severe depletion  Anterior Thigh Region Severe depletion  Posterior Calf Region Unable to assess  [edema]  Edema (RD Assessment) Mild  [bilateral hands, bilateral calf]  Hair Reviewed  Eyes Unable to assess  Mouth Unable to assess  Skin Reviewed  Nails Reviewed    Diet Order:   Diet Order             Diet NPO time specified  Diet effective now                   EDUCATION NEEDS:  Not appropriate for education at this time  Skin:  Skin Integrity Issues: Stage II: R buttocks (1x1 cm) DTI: mid sacrum (9x5x0.2 cm)  Last BM:  12/7 - type 5  Height:  Ht Readings from Last 1 Encounters:  04/19/24 6' (1.829 m)    Weight:  Wt Readings from Last 1 Encounters:  05/20/24 87.8 kg    Ideal Body Weight:  80.9 kg  BMI:  Body mass index is 26.25 kg/m.  Estimated Nutritional Needs:  Kcal:  2000-2200 Protein:  100-115g Fluid:  >/=2L    [name] [name], RD, LDN, CNSC Registered Dietitian II Please reach out via secure chat

## 2024-05-20 NOTE — Progress Notes (Signed)
 Physical Therapy Treatment Patient Details Name: [name] [name] MRN: [id number] DOB: 27-Sep-1975 Today's Date: 05/20/2024   History of Present Illness 48 yo M recently admitted with MSSA PNA MSSA bacteremia psoas abscesses and lumbar osteo 03/14/24-03/25/24 (left AMA) who presented to [facility] ED 04/14/24 w AMS. Admitted with sepsis and transferred to [facility]. Arrived to ED covered in feces, altered, with poor airway protection and was intubated, Had PEA cardiac arrest on 11/3 with 4 mins CPR extubated on 11/9. 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 PMH: includes: anxiety, HTN    PT Comments  Pt resting in bed, able to verbalize agreement to physical therapy session. PT donned PMV to aid in communication during mobility tasks. Pt demonstrates increased muscular strength of BLE with supine exercises improving to 3-/5 during heel slides and able to push through some resistance. Pt more alert and conversational during session which helped build good rapport while trying to mobilize. MAX A +2 to sit up at EOB with continual support at back secondary to pain and balance deficits. Only able to tolerate sitting up for <10seconds secondary to pain despite encouragement and cues for breathing and relaxation. Overall patient demonstrates good improvements to mobility, ROM and activity tolerance compared to previous sessions. He continues to be limited by medical complexity with limited caregiver support, decreased functional strength and endurance, impaired balance, need for trach and O2 support, cognitive deficits, and high pain levels with mobility. He will continue to benefit from skilled PT while admitted to acute hospital, and discharge recommendations continue to be LTACH.    If plan is discharge home, recommend the following: Two people to help with  walking and/or transfers;Two people to help with bathing/dressing/bathroom   Can travel by private vehicle     No  Equipment Recommendations  None recommended by PT    Recommendations for Other Services       Precautions / Restrictions Precautions Precautions: Fall;Cervical;Back Precaution Booklet Issued: No Recall of Precautions/Restrictions: Impaired Precaution/Restrictions Comments: Cortrak, trach, watch for potential UE splinting needs Required Braces or Orthoses: Cervical Brace;Spinal Brace Cervical Brace: Hard collar (order to wear at all times however patient declining wear while in bed due to discomfort) Spinal Brace: Lumbar corset (when OOB) Restrictions Weight Bearing Restrictions Per Provider Order: No     Mobility  Bed Mobility Overal bed mobility: Needs Assistance Bed Mobility: Rolling, Sidelying to Sit, Sit to Sidelying (transitioned to sitting at EOB through sidelying to maintain spinal precautions) Rolling: Total assist, Used rails Sidelying to sit: Max assist, +2 for physical assistance     Sit to sidelying: Max assist, +2 for physical assistance General bed mobility comments: completed with HOB elevated, assistance of +1 to bring BLE off bed and monitor anterior lines, +2 to elevate trunk and provide constant support to back secondary to pain, spinal precautions and sitting balance deficits    Transfers                        Ambulation/Gait                   Stairs             Wheelchair Mobility     Tilt Bed    Modified Rankin (Stroke Patients Only)       Balance Overall balance assessment: Needs assistance Sitting-balance support:  Bilateral upper extremity supported, Feet supported Sitting balance-Leahy Scale: Poor Sitting balance - Comments: +2 to maintain brief static seated balance at EOB, poor tolerance to position due to back requesting to return to supine                                     Communication Communication Factors Affecting Communication: Trach/intubated;Passey - Muir valve  Cognition Arousal: Alert     PT - Cognitive impairments: Difficult to assess Difficult to assess due to: Tracheostomy                     PT - Cognition Comments: able to communicate more today but with and without use of PMV; decreased breath support when speaking without PMV on; PMV removed at end of session as patient only permitted to have on with constant supervision Following commands: Impaired Following commands impaired: Only follows one step commands consistently    Cueing Cueing Techniques: Verbal cues, Gestural cues  Exercises General Exercises - Lower Extremity Ankle Circles/Pumps: Both, Supine, AROM, 5 reps Heel Slides: AAROM, Both, 5 reps, Other (comment) (improve muscle strength and activation into hip flexion and extension, able to push through some resistance when moving into knee extension)    General Comments        Pertinent Vitals/Pain Pain Assessment Pain Assessment: Faces Faces Pain Scale: Hurts whole lot (while moving sit<>supine) Pain Location: minimal pain in back and neck at rest and with BLE exercises, increased pain with mobility supine<>sit Pain Descriptors / Indicators: Grimacing, Discomfort, Sharp, Moaning Pain Intervention(s): Monitored during session, Limited activity within patient's tolerance, Repositioned, Relaxation    Home Living                          Prior Function            PT Goals (current goals can now be found in the care plan section) Acute Rehab PT Goals Patient Stated Goal: get stronger and move again PT Goal Formulation: With patient Time For Goal Achievement: 05/24/24 Potential to Achieve Goals: Fair Progress towards PT goals: Progressing toward goals    Frequency    Min 2X/week      PT Plan      Co-evaluation              AM-PAC PT 6 Clicks Mobility   Outcome Measure  Help  needed turning from your back to your side while in a flat bed without using bedrails?: Total Help needed moving from lying on your back to sitting on the side of a flat bed without using bedrails?: Total Help needed moving to and from a bed to a chair (including a wheelchair)?: Total Help needed standing up from a chair using your arms (e.g., wheelchair or bedside chair)?: Total Help needed to walk in hospital room?: Total Help needed climbing 3-5 steps with a railing? : Total 6 Click Score: 6    End of Session Equipment Utilized During Treatment: Oxygen;Cervical collar Activity Tolerance: Patient limited by pain;Patient tolerated treatment well Patient left: in bed;with restraints reapplied Nurse Communication: Mobility status PT Visit Diagnosis: Muscle weakness (generalized) (M62.81);Other abnormalities of gait and mobility (R26.89);Pain Pain - part of body:  (neck and back)     Time: 8482-8452 PT Time Calculation (min) (ACUTE ONLY): 30 min  Charges:    $Therapeutic Activity: 23-37 mins  Isaiah DEL. Francesa Eugenio, PT, DPT  [employer] 05/20/2024, 4:04 PM

## 2024-05-20 NOTE — Progress Notes (Signed)
 NAME:  [name] [name], MRN:  [id number], DOB:  12-09-1975, LOS: 36 ADMISSION DATE:  04/14/2024, CONSULTATION DATE:  04/14/2024 REFERRING MD:  [place] - [facility] EDP, CHIEF COMPLAINT:  AMS  History of Present Illness:  48 year old man who presented to [facility] ED 11/2 with AMS. PMHx significant for MSSA bacteremia, MSSA PNA, infective endocarditis, cervical through lumbar osteomyelitis, psoas abscess, severe protein calorie malnutrition/FTT, substance abuse. Recent admission 10/2 - 10/13 for sepsis, widespread MSSA.  Arrived to [facility] ED covered in feces, altered and with poor airway protection; he was subsequently intubated. CXR demonstrated RLL opacities. Confirmed bilateral septic pulmonary emboli, tricuspid valve endocarditis.  Required intubation and mechanical ventilation. Clinically progressed and was transferred out of ICU 11/10.  Reconsulted 11/13 for AMS, tachypnea and tachycardia.   Pertinent Medical History:  MSSA bacteremia MSSA PNA Lumbar osteomyelitis  Psoas abscess  Severe protein calorie malnutrition FTT in adult  Significant Hospital Events: Including procedures, antibiotic start and stop dates in addition to other pertinent events   11/2 [facility] ED w AMS. Intubated. Started on vanc mero. Txf request to [place] to [facility]  11/3 Extubated, developed respiratory distress requiring reintubation. Brief cardiac arrest during intubation.  11/3 Transthoracic echocardiogram > LVEF 40-45%, global hypokinesis, grade 1 diastolic dysfunction, normal RV function.  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  No mitral or aortic valve involvement 11/3 CT Chest, abdomen, pelvis >> numerous bilateral cavitary and solid nodules consistent with septic emboli, left lower lobe consolidation with a small pleural effusion, right basilar atelectasis.  Moderate free fluid in the pelvis.  Erosive changes at T5-T6, L2-L3, L4-L5 suggestive of discitis 11/3 CT Head >> no acute abnormality.   Opacified left maxillary sinus 11/10 Transferred out of ICU. 11/13 PCCM reconsulted for decreased mental status, tachycardia, tachypnea. Started diuretics 11/16 Poorly responsive, transferred back to ICU and intubated for the third time. Pressors started post intubation. Renal function worse. UOP poor.  Trach aspirate - Candida Trorpicals and Calcians 11/20 NO acute events overnight, awake and following commands.  Patient and spouse met with palliative care and collectively decision was made to proceed with trach and PEG if needed Blood culture - NEG BAL - normal flora 11/21 no acute events overnight, hemoglobin remained stable 11/23 pain, discomfort, muscle spasms 11/24 CCS trach, cortrak, off precedex  11/25 PSV 5/5 for 8hrs 11/26 - In better place mentally, motivated to continue to get better.  Thankful for care being provided. C/o of ongoing neck and back pain. Asking not to tell him when on PSV so he doesn't get more anxious Did ATC briefly Went for MRI but too much motion artifcat despite precedex  and intermitten fent/versed  and propofol  .  11/27   needs MRI again but concerns for anxiety and motion again. LE weaker than UE. Now on  ATC Trach 40%. Afebrilre. Vent overnight. C collar remains. RN sas stage 2 decub slowly progressing to stage 3.   Family request change over from klonopin  to xanax , started on methadone   11/28 - Placed on dilaudid  gtt last evening for ongoing pain, Currently rates 5/10. MRI - spine nnil acute Increaesd respioratiory secretions and o2 needs up  =-> started Ceftriaxone  and Flagyl   (Ancef  needs to be restarted 12/2 and continued through 12/16) Cutlure growing ABUNDANT SERRATA  on 05/12/24 11/30 Sputum with serratia. Trach collar all day. Day 4 methadone . PRN dilaudid  decreased.  12/1 - urology signed off - plan for long term foley with outpt urology f/u- change catheter  over wire every 30 days 12/4 PCCM asked to see as pt more difficult to wake up , transferred  to ICU for low RR and hypercarbia  Interim History/Subjective:   Improving critically ill, copious secretions needing repeated suctioning Afebrile On trach collar 28% Remains on restraints   Objective:   Blood pressure (!) 163/94, pulse 94, temperature 98.3 F (36.8 C), temperature source Oral, resp. rate 12, height 6' (1.829 m), weight 87.8 kg, SpO2 97%.    FiO2 (%):  [28 %-40 %] 28 %   Intake/Output Summary (Last 24 hours) at 05/20/2024 1258 Last data filed at 05/20/2024 1000 Gross per 24 hour  Intake 1114.78 ml  Output 1750 ml  Net -635.22 ml   Filed Weights   05/17/24 0500 05/19/24 0100 05/20/24 0500  Weight: 92.3 kg 85.8 kg 87.8 kg   Well-built chronically ill-appearing man, lying in bed, mild distress copious secretions HEENT c-collar in place.  Size 6 tracheostomy is midline, able to phonate with PMV well Bilateral clear breath sounds once suctioned, no accessory muscle use S1-S2 regular Awake, interactive, moves all 4 extremities, power decreased both lowers 3/5    Resolved Problem List:   Postintubation hypotension Metabolic encephalopathy secondary to hypercarbia, resolved Acute  on chronic hypoxic and hypercapnic respiratory failure secondary to MSSA  ARDS (resolved), mucous plugging (improved)  and Septic pulmonary emboli  Nonoliguric AK VAP 05/10/24 - confirmed as SERRATIA 05/12/24  Assessment and Plan:   Acute on chronic hypercarbic respiratory failure likely from oversedation (BZP,methadone  and gabapentin ), 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   On trach collar since 12/6, continue trach collar as tolerated   MSSA with Dissemination complicated by infective endocarditis of the tricuspid valve - last echo 04/15/24, lumbar osteomyelitis, Psoas abscess, septic pulmonary emboli, C-spine septic arthritis, C Spine Osteomyelitis- functional quadriplegia Serratia tracheobronchitis/HAP  Encourage cervical collar Broaden from Ancef  to Unasyn  given copious  secretions   CKD 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% with global hypokinesis of the left ventricle and grade 1 diastolic dysfunction  - Diuresis as tolerated, monitor renal function    History of substance abuse and chronic anxiety  -On methadone  with oxycodone  for breakthrough -Hopeful that once he is able to phonate better, agitation will decrease and we can do it with restraints - Continue Xanax  and gabapentin   Urinary retention now has coude placed by urology 11/27 -Plan is to reassess by urology in 1 month  Anemia of critical illness -2 units PRBC on 12/5 Subcu heparin  on hold for now, can resume soon if no evidence of bleeding  Protein calorie malnutrition  On TFs   Stage II pressure ulcer sacrum, present on admission Deconditioning Critical illness neuromyopathy    - Continue PT Plan is for LTAC vs SNF  My independent critical care time was 31 minutes  Harden Staff MD. DEBE. [place] [facility] Pager : [phone]  If no response to pager , please call [phone] until 7 pm After 7:00 pm call Elink  [phone]   05/20/2024

## 2024-05-20 NOTE — Plan of Care (Signed)
   Problem: Education: Goal: Knowledge of General Education information will improve Description Including pain rating scale, medication(s)/side effects and non-pharmacologic comfort measures Outcome: Progressing   Problem: Health Behavior/Discharge Planning: Goal: Ability to manage health-related needs will improve Outcome: Progressing

## 2024-05-21 ENCOUNTER — Inpatient Hospital Stay (HOSPITAL_COMMUNITY): Payer: MEDICAID

## 2024-05-21 LAB — CBC
HCT: 29.6 % — ABNORMAL LOW (ref 39.0–52.0)
Hemoglobin: 9.2 g/dL — ABNORMAL LOW (ref 13.0–17.0)
MCH: 29.2 pg (ref 26.0–34.0)
MCHC: 31.1 g/dL (ref 30.0–36.0)
MCV: 94 fL (ref 80.0–100.0)
Platelets: 219 K/uL (ref 150–400)
RBC: 3.15 MIL/uL — ABNORMAL LOW (ref 4.22–5.81)
RDW: 17.6 % — ABNORMAL HIGH (ref 11.5–15.5)
WBC: 9 K/uL (ref 4.0–10.5)
nRBC: 0 % (ref 0.0–0.2)

## 2024-05-21 LAB — GLUCOSE, CAPILLARY
Glucose-Capillary: 107 mg/dL — ABNORMAL HIGH (ref 70–99)
Glucose-Capillary: 102 mg/dL — ABNORMAL HIGH (ref 70–99)
Glucose-Capillary: 97 mg/dL (ref 70–99)
Glucose-Capillary: 113 mg/dL — ABNORMAL HIGH (ref 70–99)
Glucose-Capillary: 108 mg/dL — ABNORMAL HIGH (ref 70–99)
Glucose-Capillary: 109 mg/dL — ABNORMAL HIGH (ref 70–99)

## 2024-05-21 LAB — BASIC METABOLIC PANEL WITH GFR
Anion gap: 18 — ABNORMAL HIGH (ref 5–15)
BUN: 108 mg/dL — ABNORMAL HIGH (ref 6–20)
CO2: 25 mmol/L (ref 22–32)
Calcium: 8.2 mg/dL — ABNORMAL LOW (ref 8.9–10.3)
Chloride: 101 mmol/L (ref 98–111)
Creatinine, Ser: 2.92 mg/dL — ABNORMAL HIGH (ref 0.61–1.24)
GFR, Estimated: 26 mL/min — ABNORMAL LOW (ref >60)
Glucose, Bld: 100 mg/dL — ABNORMAL HIGH (ref 70–99)
Potassium: 3.3 mmol/L — ABNORMAL LOW (ref 3.5–5.1)
Sodium: 144 mmol/L (ref 135–145)

## 2024-05-21 MED ORDER — FUROSEMIDE 10 MG/ML IJ SOLN
40.0000 mg | Freq: Once | INTRAMUSCULAR | Status: AC
  Administered 2024-05-21: 40 mg via INTRAVENOUS
  Filled 2024-05-21: qty 4

## 2024-05-21 MED ORDER — POTASSIUM CHLORIDE 20 MEQ PO PACK
40.0000 meq | PACK | ORAL | Status: AC
  Administered 2024-05-21: 40 meq
  Filled 2024-05-21: qty 2

## 2024-05-21 MED ORDER — FREE WATER
200.0000 mL | Freq: Four times a day (QID) | Status: DC
  Administered 2024-05-21 – 2024-05-22 (×4): 200 mL

## 2024-05-21 MED ORDER — PANTOPRAZOLE SODIUM 40 MG IV SOLR
40.0000 mg | INTRAVENOUS | Status: DC
  Administered 2024-05-21 – 2024-05-22 (×2): 40 mg via INTRAVENOUS
  Filled 2024-05-21 (×2): qty 10

## 2024-05-21 MED ORDER — POTASSIUM CHLORIDE 20 MEQ PO PACK
40.0000 meq | PACK | Freq: Once | ORAL | Status: AC
  Administered 2024-05-21: 40 meq
  Filled 2024-05-21: qty 2

## 2024-05-21 NOTE — Plan of Care (Signed)
   Problem: Education: Goal: Knowledge of General Education information will improve Description Including pain rating scale, medication(s)/side effects and non-pharmacologic comfort measures Outcome: Progressing   Problem: Health Behavior/Discharge Planning: Goal: Ability to manage health-related needs will improve Outcome: Progressing

## 2024-05-21 NOTE — Progress Notes (Addendum)
 NAME:  [name] [name], MRN:  [id number], DOB:  07-Jan-1976, LOS: 37 ADMISSION DATE:  04/14/2024, CONSULTATION DATE:  04/14/2024 REFERRING MD:  [place] - [facility] EDP, CHIEF COMPLAINT:  AMS  History of Present Illness:  48 year old man who presented to [facility] ED 11/2 with AMS. PMHx significant for MSSA bacteremia, MSSA PNA, infective endocarditis, cervical through lumbar osteomyelitis, psoas abscess, severe protein calorie malnutrition/FTT, substance abuse. Recent admission 10/2 - 10/13 for sepsis, widespread MSSA.  Arrived to [facility] ED covered in feces, altered and with poor airway protection; he was subsequently intubated. CXR demonstrated RLL opacities. Confirmed bilateral septic pulmonary emboli, tricuspid valve endocarditis.  Required intubation and mechanical ventilation. Clinically progressed and was transferred out of ICU 11/10.  Reconsulted 11/13 for AMS, tachypnea and tachycardia.   Pertinent Medical History:  MSSA bacteremia MSSA PNA Lumbar osteomyelitis  Psoas abscess  Severe protein calorie malnutrition FTT in adult  Significant Hospital Events: Including procedures, antibiotic start and stop dates in addition to other pertinent events   11/2 [facility] ED w AMS. Intubated. Started on vanc mero. Txf request to [place] to [facility]  11/3 Extubated, developed respiratory distress requiring reintubation. Brief cardiac arrest during intubation.  11/3 Transthoracic echocardiogram > LVEF 40-45%, global hypokinesis, grade 1 diastolic dysfunction, normal RV function.  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  No mitral or aortic valve involvement 11/3 CT Chest, abdomen, pelvis >> numerous bilateral cavitary and solid nodules consistent with septic emboli, left lower lobe consolidation with a small pleural effusion, right basilar atelectasis.  Moderate free fluid in the pelvis.  Erosive changes at T5-T6, L2-L3, L4-L5 suggestive of discitis 11/3 CT Head >> no acute abnormality.   Opacified left maxillary sinus 11/10 Transferred out of ICU. 11/13 PCCM reconsulted for decreased mental status, tachycardia, tachypnea. Started diuretics 11/16 Poorly responsive, transferred back to ICU and intubated for the third time. Pressors started post intubation. Renal function worse. UOP poor.  Trach aspirate - Candida Trorpicals and Calcians 11/20 NO acute events overnight, awake and following commands.  Patient and spouse met with palliative care and collectively decision was made to proceed with trach and PEG if needed Blood culture - NEG BAL - normal flora 11/21 no acute events overnight, hemoglobin remained stable 11/23 pain, discomfort, muscle spasms 11/24 CCS trach, cortrak, off precedex  11/25 PSV 5/5 for 8hrs 11/26 - In better place mentally, motivated to continue to get better.  Thankful for care being provided. C/o of ongoing neck and back pain. Asking not to tell him when on PSV so he doesn't get more anxious Did ATC briefly Went for MRI but too much motion artifcat despite precedex  and intermitten fent/versed  and propofol  .  11/27   needs MRI again but concerns for anxiety and motion again. LE weaker than UE. Now on  ATC Trach 40%. Afebrilre. Vent overnight. C collar remains. RN sas stage 2 decub slowly progressing to stage 3.   Family request change over from klonopin  to xanax , started on methadone   11/28 - Placed on dilaudid  gtt last evening for ongoing pain, Currently rates 5/10. MRI - spine nnil acute Increaesd respioratiory secretions and o2 needs up  =-> started Ceftriaxone  and Flagyl   (Ancef  needs to be restarted 12/2 and continued through 12/16) Cutlure growing ABUNDANT SERRATA  on 05/12/24 11/30 Sputum with serratia. Trach collar all day. Day 4 methadone . PRN dilaudid  decreased.  12/1 - urology signed off - plan for long term foley with outpt urology f/u- change catheter  over wire every 30 days 12/4 PCCM asked to see as pt more difficult to wake up , transferred  to ICU for low RR and hypercarbia  Interim History/Subjective:   Continues to have secretions per RN reports requiring suctioning twice today so far Afebrile On trach collar 28% Remains with mittens   Objective:   Blood pressure (!) 135/108, pulse 88, temperature (!) 97.4 F (36.3 C), temperature source Axillary, resp. rate (!) 3, height 6' (1.829 m), weight 86.5 kg, SpO2 92%.    FiO2 (%):  [28 %] 28 %   Intake/Output Summary (Last 24 hours) at 05/21/2024 1043 Last data filed at 05/21/2024 0537 Gross per 24 hour  Intake 900 ml  Output 2450 ml  Net -1550 ml   Filed Weights   05/19/24 0100 05/20/24 0500 05/21/24 0500  Weight: 85.8 kg 87.8 kg 86.5 kg   Unchanged exam Well-built anxious appearing chronically ill-appearing man, lying in bed, no distress HEENT c-collar in place.  Size 6 tracheostomy , able to phonate with PMV well Bilateral clear breath sounds no accessory muscle use S1-S2 regular Both lower extremities weaker power 3/5  Labs show stable creatinine 2.9 BUN stable at 108, mild hypokalemia 3.3, no leukocytosis, stable anemia Chest x-ray shows significantly improved airspace disease especially on left   Resolved Problem List:   Postintubation hypotension Metabolic encephalopathy secondary to hypercarbia, resolved Acute  on chronic hypoxic and hypercapnic respiratory failure secondary to MSSA  ARDS (resolved), mucous plugging (improved)  and Septic pulmonary emboli  Nonoliguric AK VAP 05/10/24 - confirmed as SERRATIA 05/12/24  Assessment and Plan:   Acute on chronic hypercarbic respiratory failure likely from oversedation (BZP,methadone  and gabapentin ), 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   On trach collar since 12/6, continue trach collar as tolerated Once suction requirements decrease, he can be transferred out of ICU   MSSA with Dissemination complicated by infective endocarditis of the tricuspid valve - last echo 04/15/24, lumbar osteomyelitis,  Psoas abscess, septic pulmonary emboli, C-spine septic arthritis, C Spine Osteomyelitis- functional quadriplegia Serratia tracheobronchitis/HAP  Encourage cervical collar 12/8 Broaden from Ancef  to Unasyn  given copious secretions -await sensitivities of GNR from respiratory culture 12/6   CKD 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% with global hypokinesis of the left ventricle and grade 1 diastolic dysfunction  - Continue Lasix  40 mg Hypokalemia will be repleted Increase free water  to 200 Q6    History of substance abuse and chronic anxiety  -On methadone  with oxycodone  for breakthrough -Hopeful that once he is able to phonate better, agitation will decrease and we can dc restraints - Continue Xanax  and gabapentin   Urinary retention now has coude placed by urology 11/27 -Plan is to reassess by urology in 1 month  Anemia of critical illness -2 units PRBC on 12/5 Resume subcutaneous heparin  since no evidence of bleeding  Protein calorie malnutrition  Continue TFs   Stage II pressure ulcer sacrum, present on admission Deconditioning Critical illness neuromyopathy    - Continue PT Plan is for LTAC vs SNF now that he is off the vent   Harden Staff MD. FCCP. [facility] Pager : [phone]  If no response to pager , please call [phone] until 7 pm After 7:00 pm call Elink  [phone]   05/21/2024

## 2024-05-21 NOTE — Progress Notes (Incomplete)
 Patient ID: [name] [name], male   DOB: 19-Jun-1975, 48 y.o.   MRN: [id number]    Progress Note from the Palliative Medicine Team at [facility]   Patient Name: [name] [name]        Date: 05/21/2024 DOB: 08-30-1975  Age: 48 y.o. MRN#: [id number] Attending Physician: [name] Harden GAILS, MD Primary Care Physician: Bertell Satterfield, MD Admit Date: 04/14/2024   Reason for Consultation/Follow-up   Establishing Goals of Care   HPI/ Brief Hospital Review 48 year old man who presented to [facility] ED 11/2 with AMS. PMHx significant for MSSA bacteremia, MSSA PNA, infective endocarditis, cervical through lumbar osteomyelitis, psoas abscess, severe protein calorie malnutrition/FTT, substance abuse. Recent admission 10/2 - 10/13 for sepsis, widespread MSSA.   Arrived to [facility] ED covered in feces, altered and with poor airway protection; he was subsequently intubated. CXR demonstrated RLL opacities. Confirmed bilateral septic pulmonary emboli, tricuspid valve endocarditis.  Required intubation and mechanical ventilation. Clinically progressed and was transferred out of ICU 11/10.  Significant Events   11/2 [facility] ED w AMS. Intubated.  Transferred to [facility] 11/3 Extubated, developed respiratory distress requiring reintubation. Brief cardiac arrest during intubation.  11/3 Transthoracic echocardiogram > LVEF 40-45%, global hypokinesis 11/3 CT Chest, abdomen, pelvis >> numerous bilateral cavitary and solid nodules consistent with septic emboli, left lower lobe consolidation with a small pleural effusion, right basilar atelectasis.  Erosive changes at T5-T6, L2-L3, L4-L5 suggestive of discitis 11/3 CT Head >> no acute abnormality.  11/10 Transferred out of ICU. 11/13 PCCM reconsulted for decreased mental status, tachycardia, tachypnea 11/16 Poorly responsive, transferred back to ICU and intubated for the third time. Pressors started post intubation. Renal function worse. UOP poor.  Trach aspirate -  Candida Trorpicals and Calcians 11/20 NO acute events overnight, awake and following commands.  Patient and spouse met with palliative care and collectively decision was made to proceed with trach and PEG if needed Blood culture - NEG BAL - normal flora 11/21 no acute events overnight, hemoglobin remained stable 11/23 pain, discomfort, muscle spasms 11/24 CCS trach, cortrak, off precedex  11/25 PSV 5/5 for 8hrs 11/26 - In better place mentally, motivated to continue to get better.  Thankful for care being provided. C/o of ongoing neck and back pain. Asking not to tell him when on PSV so he doesn't get more anxious Did ATC briefly Went for MRI but too much motion artifcat despite precedex  and intermitten fent/versed  and propofol  .  11/27   needs MRI again but concerns for anxiety and motion again. LE weaker than UE. Now on  ATC Trach 40%. Afebrilre. Vent overnight. C collar remains. RN sas stage 2 decub slowly progressing to stage 3.   Family request change over from klonopin  to xanax , started on methadone   11/28 - Placed on dilaudid  gtt last evening for ongoing pain, Currently rates 5/10. MRI - spine nnil acute Increaesd respioratiory secretions and o2 needs up  =-> started Ceftriaxone  and Flagyl   (Ancef  needs to be restarted 12/2 and continued through 12/16) Cutlure growing ABUNDANT SERRATA  on 05/12/24 11/30 Sputum with serratia. Trach collar all day. Day 4 methadone . PRN dilaudid  decreased.    Subjective  Extensive chart review has been completed prior to meeting with patient/family  including labs, vital signs, imaging, progress/consult notes, orders, medications and available advance directive documents.    This NP assessed patient at the bedside as a follow up for palliative medicine needs and emotional support.  No family at bedside.  Mr. Marmolejos is  Education offered on the seriousness of his current medical situation, and the likely long-term projected trajectory in the  context of multiple comorbidities.  Discussed that, despite full medical support the overall outcomes remain uncertain.  Education offered on patient's high risk of decompensation secondary to underlying comorbidities, infection, immobility, and trach.       Warren tells me that [employer] and actually signed out AMA on last admission.  48 is from familiar with long-term IV therapy having had endocarditis herself in the past where she ended up signing out AMA also.  Education offered on the difference between a full medical support path attempting to prolong life and a palliative comfort path foregoing life-prolonging measures, allowing for natural death.  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  Questions and concerns addressed   Discussed with primary team and nursing staff   Time:  50  minutes  Detailed review of medical records ( labs, imaging, vital signs), medically appropriate exam ( MS, skin, cardiac,  resp)   discussed with treatment team, counseling and education to patient, family, staff, documenting clinical information, medication management, coordination of care    Ronal Plants NP  Palliative Medicine Team Team Phone # [phone] Pager [phone]

## 2024-05-22 ENCOUNTER — Inpatient Hospital Stay (HOSPITAL_COMMUNITY): Payer: MEDICAID

## 2024-05-22 DIAGNOSIS — I613 Nontraumatic intracerebral hemorrhage in brain stem: Secondary | ICD-10-CM

## 2024-05-22 DIAGNOSIS — G919 Hydrocephalus, unspecified: Secondary | ICD-10-CM

## 2024-05-22 DIAGNOSIS — R29727 NIHSS score 27: Secondary | ICD-10-CM

## 2024-05-22 DIAGNOSIS — I639 Cerebral infarction, unspecified: Secondary | ICD-10-CM

## 2024-05-22 DIAGNOSIS — I615 Nontraumatic intracerebral hemorrhage, intraventricular: Secondary | ICD-10-CM

## 2024-05-22 DIAGNOSIS — G935 Compression of brain: Secondary | ICD-10-CM

## 2024-05-22 DIAGNOSIS — I610 Nontraumatic intracerebral hemorrhage in hemisphere, subcortical: Secondary | ICD-10-CM

## 2024-05-22 DIAGNOSIS — I749 Embolism and thrombosis of unspecified artery: Secondary | ICD-10-CM

## 2024-05-22 DIAGNOSIS — I629 Nontraumatic intracranial hemorrhage, unspecified: Secondary | ICD-10-CM

## 2024-05-22 LAB — BASIC METABOLIC PANEL WITH GFR
Anion gap: 10 (ref 5–15)
BUN: 113 mg/dL — ABNORMAL HIGH (ref 6–20)
CO2: 33 mmol/L — ABNORMAL HIGH (ref 22–32)
Calcium: 8.6 mg/dL — ABNORMAL LOW (ref 8.9–10.3)
Chloride: 104 mmol/L (ref 98–111)
Creatinine, Ser: 2.78 mg/dL — ABNORMAL HIGH (ref 0.61–1.24)
GFR, Estimated: 28 mL/min — ABNORMAL LOW (ref >60)
Glucose, Bld: 113 mg/dL — ABNORMAL HIGH (ref 70–99)
Potassium: 3.7 mmol/L (ref 3.5–5.1)
Sodium: 147 mmol/L — ABNORMAL HIGH (ref 135–145)

## 2024-05-22 LAB — CBC
HCT: 29.7 % — ABNORMAL LOW (ref 39.0–52.0)
Hemoglobin: 9.2 g/dL — ABNORMAL LOW (ref 13.0–17.0)
MCH: 29.4 pg (ref 26.0–34.0)
MCHC: 31 g/dL (ref 30.0–36.0)
MCV: 94.9 fL (ref 80.0–100.0)
Platelets: 247 K/uL (ref 150–400)
RBC: 3.13 MIL/uL — ABNORMAL LOW (ref 4.22–5.81)
RDW: 17.7 % — ABNORMAL HIGH (ref 11.5–15.5)
WBC: 10.5 K/uL (ref 4.0–10.5)
nRBC: 0 % (ref 0.0–0.2)

## 2024-05-22 LAB — GLUCOSE, CAPILLARY
Glucose-Capillary: 110 mg/dL — ABNORMAL HIGH (ref 70–99)
Glucose-Capillary: 100 mg/dL — ABNORMAL HIGH (ref 70–99)
Glucose-Capillary: 106 mg/dL — ABNORMAL HIGH (ref 70–99)

## 2024-05-22 LAB — POCT I-STAT 7, (LYTES, BLD GAS, ICA,H+H)
Acid-Base Excess: 10 mmol/L — ABNORMAL HIGH (ref 0.0–2.0)
Bicarbonate: 33.8 mmol/L — ABNORMAL HIGH (ref 20.0–28.0)
Calcium, Ion: 1.18 mmol/L (ref 1.15–1.40)
HCT: 27 % — ABNORMAL LOW (ref 39.0–52.0)
Hemoglobin: 9.2 g/dL — ABNORMAL LOW (ref 13.0–17.0)
O2 Saturation: 99 %
Patient temperature: 98
Potassium: 3.9 mmol/L (ref 3.5–5.1)
Sodium: 147 mmol/L — ABNORMAL HIGH (ref 135–145)
TCO2: 35 mmol/L — ABNORMAL HIGH (ref 22–32)
pCO2 arterial: 42.6 mmHg (ref 32–48)
pH, Arterial: 7.506 — ABNORMAL HIGH (ref 7.35–7.45)
pO2, Arterial: 123 mmHg — ABNORMAL HIGH (ref 83–108)

## 2024-05-22 LAB — C-REACTIVE PROTEIN: CRP: 10.6 mg/dL — ABNORMAL HIGH (ref <1.0)

## 2024-05-22 MED ORDER — STROKE: EARLY STAGES OF RECOVERY BOOK
Freq: Once | Status: AC
  Filled 2024-05-22: qty 1

## 2024-05-22 MED ORDER — ACETAMINOPHEN 160 MG/5ML PO SOLN: 650.0000 mg | ORAL | Status: DC | PRN

## 2024-05-22 MED ORDER — NOREPINEPHRINE 4 MG/250ML-% IV SOLN
0.0000 ug/min | INTRAVENOUS | Status: DC
  Filled 2024-05-22: qty 250

## 2024-05-22 MED ORDER — MIDAZOLAM HCL 2 MG/2ML IJ SOLN
INTRAMUSCULAR | Status: AC
  Administered 2024-05-22: 2 mg
  Filled 2024-05-22: qty 2

## 2024-05-22 MED ORDER — MIDAZOLAM HCL (PF) 2 MG/2ML IJ SOLN
2.0000 mg | Freq: Once | INTRAMUSCULAR | Status: AC
  Filled 2024-05-22: qty 2

## 2024-05-22 MED ORDER — MIDAZOLAM HCL (PF) 10 MG/2ML IJ SOLN: 10.0000 mg | Freq: Once | INTRAMUSCULAR | Status: AC

## 2024-05-22 MED ORDER — LIDOCAINE HCL (PF) 1 % IJ SOLN
INTRAMUSCULAR | Status: AC
  Filled 2024-05-22: qty 5

## 2024-05-22 MED ORDER — LABETALOL HCL 5 MG/ML IV SOLN: 20.0000 mg | Freq: Once | INTRAVENOUS | Status: AC

## 2024-05-22 MED ORDER — SENNOSIDES-DOCUSATE SODIUM 8.6-50 MG PO TABS: 1.0000 | ORAL_TABLET | Freq: Two times a day (BID) | ORAL | Status: DC

## 2024-05-22 MED ORDER — PHENYTOIN 125 MG/5ML PO SUSP
150.0000 mg | Freq: Three times a day (TID) | ORAL | Status: DC
  Administered 2024-05-22 – 2024-05-23 (×3): 150 mg
  Filled 2024-05-22 (×4): qty 8

## 2024-05-22 MED ORDER — CLEVIDIPINE BUTYRATE 0.5 MG/ML IV EMUL
0.0000 mg/h | INTRAVENOUS | Status: DC
  Administered 2024-05-22: 2 mg/h via INTRAVENOUS
  Filled 2024-05-22 (×2): qty 50

## 2024-05-22 MED ORDER — EPINEPHRINE 1 MG/10ML IV SOSY
PREFILLED_SYRINGE | INTRAVENOUS | Status: AC
  Filled 2024-05-22: qty 10

## 2024-05-22 MED ORDER — LEVETIRACETAM (KEPPRA) 500 MG/5 ML ADULT IV PUSH: 1000.0000 mg | INTRAVENOUS | Status: AC

## 2024-05-22 MED ORDER — NOREPINEPHRINE 4 MG/250ML-% IV SOLN
INTRAVENOUS | Status: AC
  Administered 2024-05-22: 6 mg via INTRAVENOUS
  Filled 2024-05-22: qty 250

## 2024-05-22 MED ORDER — ACETAMINOPHEN 325 MG PO TABS: 650.0000 mg | ORAL_TABLET | ORAL | Status: DC | PRN

## 2024-05-22 MED ORDER — MIDAZOLAM HCL 2 MG/2ML IJ SOLN
INTRAMUSCULAR | Status: AC
  Administered 2024-05-22: 4 mg via INTRAVENOUS
  Filled 2024-05-22: qty 2

## 2024-05-22 MED ORDER — MIDAZOLAM HCL (PF) 10 MG/2ML IJ SOLN
INTRAMUSCULAR | Status: AC
  Administered 2024-05-22: 10 mg via INTRAVENOUS
  Filled 2024-05-22: qty 2

## 2024-05-22 MED ORDER — FUROSEMIDE 10 MG/ML IJ SOLN
40.0000 mg | Freq: Once | INTRAMUSCULAR | Status: AC
  Administered 2024-05-22: 40 mg via INTRAVENOUS
  Filled 2024-05-22: qty 4

## 2024-05-22 MED ORDER — SODIUM CHLORIDE 0.9 % IV SOLN
75.0000 mL/h | INTRAVENOUS | Status: AC
  Administered 2024-05-22 – 2024-05-23 (×2): 75 mL/h via INTRAVENOUS

## 2024-05-22 MED ORDER — PANTOPRAZOLE SODIUM 40 MG IV SOLR: 40.0000 mg | Freq: Every day | INTRAVENOUS | Status: DC

## 2024-05-22 MED ORDER — LABETALOL HCL 5 MG/ML IV SOLN
INTRAVENOUS | Status: AC
  Administered 2024-05-22: 10 mg via INTRAVENOUS
  Filled 2024-05-22: qty 4

## 2024-05-22 MED ORDER — SODIUM CHLORIDE 0.9 % IV SOLN: 250.0000 mL | INTRAVENOUS | Status: DC

## 2024-05-22 MED ORDER — LEVETIRACETAM 500 MG/5ML IV SOLN
INTRAVENOUS | Status: AC
  Administered 2024-05-22: 1000 mg via INTRAVENOUS
  Filled 2024-05-22: qty 10

## 2024-05-22 MED ORDER — SODIUM CHLORIDE 0.9 % IV SOLN
1500.0000 mg | Freq: Once | INTRAVENOUS | Status: AC
  Administered 2024-05-22: 1500 mg via INTRAVENOUS
  Filled 2024-05-22 (×2): qty 30

## 2024-05-22 MED ORDER — MIDAZOLAM HCL (PF) 2 MG/2ML IJ SOLN: 4.0000 mg | INTRAMUSCULAR | Status: DC | PRN

## 2024-05-22 MED ORDER — FREE WATER: 200.0000 mL | Status: DC

## 2024-05-22 MED ORDER — PROPOFOL 1000 MG/100ML IV EMUL
INTRAVENOUS | Status: AC
  Administered 2024-05-22: 20 ug/kg/min via INTRAVENOUS
  Filled 2024-05-22: qty 100

## 2024-05-22 MED ORDER — POTASSIUM CHLORIDE 20 MEQ PO PACK
40.0000 meq | PACK | Freq: Once | ORAL | Status: AC
  Administered 2024-05-22: 40 meq
  Filled 2024-05-22: qty 2

## 2024-05-22 MED ORDER — PROPOFOL 1000 MG/100ML IV EMUL
20.0000 ug/kg/min | INTRAVENOUS | Status: DC
  Filled 2024-05-22: qty 100

## 2024-05-22 MED ORDER — PROPOFOL BOLUS VIA INFUSION
1.0000 mg/kg | Freq: Once | INTRAVENOUS | Status: AC
  Administered 2024-05-22: 82.8 mg via INTRAVENOUS
  Filled 2024-05-22: qty 83

## 2024-05-22 MED ORDER — FENTANYL CITRATE (PF) 50 MCG/ML IJ SOSY
PREFILLED_SYRINGE | INTRAMUSCULAR | Status: AC
  Filled 2024-05-22: qty 1

## 2024-05-22 MED ORDER — LABETALOL HCL 5 MG/ML IV SOLN
INTRAVENOUS | Status: AC
  Filled 2024-05-22: qty 4

## 2024-05-22 MED ORDER — HYDRALAZINE HCL 20 MG/ML IJ SOLN: 2.0000 mg | INTRAMUSCULAR | Status: DC | PRN

## 2024-05-22 MED ORDER — CLEVIDIPINE BUTYRATE 0.5 MG/ML IV EMUL
0.0000 mg/h | INTRAVENOUS | Status: DC
  Administered 2024-05-22: 2 mg/h via INTRAVENOUS
  Filled 2024-05-22: qty 100

## 2024-05-22 MED ORDER — LIDOCAINE-EPINEPHRINE 1 %-1:100000 IJ SOLN
20.0000 mL | Freq: Once | INTRAMUSCULAR | Status: AC
  Administered 2024-05-22: 10 mL via INTRADERMAL
  Filled 2024-05-22: qty 1
  Filled 2024-05-22: qty 20

## 2024-05-22 MED ORDER — ACETAMINOPHEN 650 MG RE SUPP: 650.0000 mg | RECTAL | Status: DC | PRN

## 2024-05-22 MED ORDER — MIDAZOLAM HCL 2 MG/2ML IJ SOLN
INTRAMUSCULAR | Status: AC
  Administered 2024-05-22: 2 mg via INTRAVENOUS
  Filled 2024-05-22: qty 2

## 2024-05-22 MED ORDER — SODIUM CHLORIDE 0.9 % IV SOLN
1.0000 g | Freq: Two times a day (BID) | INTRAVENOUS | Status: DC
  Administered 2024-05-22 – 2024-05-23 (×3): 1 g via INTRAVENOUS
  Filled 2024-05-22 (×3): qty 20

## 2024-05-22 MED ORDER — LEVETIRACETAM (KEPPRA) 500 MG/5 ML ADULT IV PUSH
500.0000 mg | Freq: Two times a day (BID) | INTRAVENOUS | Status: DC
  Administered 2024-05-22 – 2024-05-23 (×2): 500 mg via INTRAVENOUS
  Filled 2024-05-22 (×2): qty 5

## 2024-05-22 NOTE — Consult Note (Signed)
 1821:  [place] notified of pt status. POC: Marlana Dumena. Reference number: [id number]

## 2024-05-22 NOTE — Progress Notes (Addendum)
 PT Cancellation Note  Patient Details Name: [name] [name] MRN: [id number] DOB: Mar 19, 1976   Cancelled Treatment:    Reason Eval/Treat Not Completed: Medical issues which prohibited therapy. Pt noted to have right sided flaccidity, left gaze deviation and unable to verbalize. Rapid Response with subsequent Code Stroke called. Active Bed Rest orders for 24hrs. Will defer PT for today, will attempt when pt is medically appropriate to participate.  Isaiah DEL. Emileigh Kellett, PT, DPT  [employer] 05/22/2024, 11:26 AM

## 2024-05-22 NOTE — Progress Notes (Signed)
 05/22/2024 Progressive neurological deterioration. Pupils fixed dilated no corneals. GCS3 Repeat CT worsening bleed/edema/brainstem involvement impending herniation Nonsurvivable progression. Wife called. Remains full code, understands death is inevitable. Continue prop for now if anything for comfort.  Rolan Sharps MD PCCM

## 2024-05-22 NOTE — Progress Notes (Signed)
 eLink Physician-Brief Progress Note Patient Name: [name] [name] DOB: 04/25/1976 MRN: [id number]   Date of Service  05/22/2024  HPI/Events of Note  Patient became hypotensive after Cerebyx  gtt was started.  eICU Interventions  Cerebyx  and Propofol  gtt discontinued, Levophed  gtt ordered with good BP recovery.  Cerebyx  discontinued and enteral Dilantin  substituted.        Stevey Stapleton U Marty Sadlowski 05/22/2024, 9:54 PM

## 2024-05-22 NOTE — Progress Notes (Signed)
 SLP Cancellation Note  Patient Details Name: [name] [name] MRN: [id number] DOB: Jun 06, 1976   Cancelled treatment:       Reason Eval/Treat Not Completed: Other (comment). Arrived at 10:10 to attempt FEES. Observed pts right hemiparesis, gaze deviated to the left, pt not verbalizing. Reported concerns to RN and MD and Rapid Response RN arrived to assess pt. Will defer swallow eval today.   Ontario Pettengill, Consuelo Fitch 05/22/2024, 10:34 AM

## 2024-05-22 NOTE — Progress Notes (Addendum)
 Called back to the ICU for concern for patient having seizures after EVD placement. Seen and examined at bedside, Rightward neck deviation and right neck twitching. He was given Versed  4 mg IV I ordered additional 4 mg Versed  IV Keppra  load 1 g IV-max for renal function Start Keppra  500 twice daily Stat LTM EEG MR safe leads CT head in the AM  If he continues to seize, will escalate antiepileptics  Plan d/w Drs. Claudene (PCCM) and Janjua (Neurosurgery)  Eligio Lav, MD Neurology   Additional 20 min cc time.   Addendum Patient continuing to seize after receiving Keppra  load Will load with fosphenytoin  20 mg/kg phenytoin  equivalents and start Dilantin  100 mg 3 times daily. Will also order propofol  Will follow EEG and put the patient with you.  Eligio Lav, MD Neurology  Additional 10 minutes CC time

## 2024-05-22 NOTE — Progress Notes (Addendum)
 This chaplain responded to PMT [facility] consult for Pt. spiritual care. The chaplain understands the Pt. communicated I'll try it, when NP-Mary asked about a chaplain visit.  The Pt. is receiving care from [facility] at the time of the visit. This chaplain understands from [facility] the Pt. may be described as aphasic today and ST will be attempting swallow test.   **1110 This chaplain appreciates clarification from ST. This chaplain understands aphasia at time of visit was different from Pt. baseline. Swallow eval will be deferred today.  The chaplain understands the Pt. RN can assist with PMV at time of chaplain's F/U visit and bedside presence. This chaplain is available for F/U spiritual care as needed.  Chaplain Leeroy Hummer [phone]

## 2024-05-22 NOTE — Progress Notes (Signed)
 NAME:  ERIN UECKER, MRN:  [id number], DOB:  1975-08-25, LOS: 38 ADMISSION DATE:  04/14/2024, CONSULTATION DATE:  04/14/2024 REFERRING MD:  Suzette - [facility] EDP, CHIEF COMPLAINT:  AMS  History of Present Illness:  48 year old man who presented to [facility] ED 11/2 with AMS. PMHx significant for MSSA bacteremia, MSSA PNA, infective endocarditis, cervical through lumbar osteomyelitis, psoas abscess, severe protein calorie malnutrition/FTT, substance abuse. Recent admission 10/2 - 10/13 for sepsis, widespread MSSA.  Arrived to [facility] ED covered in feces, altered and with poor airway protection; he was subsequently intubated. CXR demonstrated RLL opacities. Confirmed bilateral septic pulmonary emboli, tricuspid valve endocarditis.  Required intubation and mechanical ventilation. Clinically progressed and was transferred out of ICU 11/10.  Reconsulted 11/13 for AMS, tachypnea and tachycardia.   Pertinent Medical History:  MSSA bacteremia MSSA PNA Lumbar osteomyelitis  Psoas abscess  Severe protein calorie malnutrition FTT in adult  Significant Hospital Events: Including procedures, antibiotic start and stop dates in addition to other pertinent events   11/2 [facility] ED w AMS. Intubated. Started on vanc mero. Txf request to [place] to [facility]  11/3 Extubated, developed respiratory distress requiring reintubation. Brief cardiac arrest during intubation.  11/3 Transthoracic echocardiogram > LVEF 40-45%, global hypokinesis, grade 1 diastolic dysfunction, normal RV function.  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  No mitral or aortic valve involvement 11/3 CT Chest, abdomen, pelvis >> numerous bilateral cavitary and solid nodules consistent with septic emboli, left lower lobe consolidation with a small pleural effusion, right basilar atelectasis.  Moderate free fluid in the pelvis.  Erosive changes at T5-T6, L2-L3, L4-L5 suggestive of discitis 11/3 CT Head >> no acute abnormality.   Opacified left maxillary sinus 11/10 Transferred out of ICU. 11/13 PCCM reconsulted for decreased mental status, tachycardia, tachypnea. Started diuretics 11/16 Poorly responsive, transferred back to ICU and intubated for the third time. Pressors started post intubation. Renal function worse. UOP poor.  Trach aspirate - Candida Trorpicals and Calcians 11/20 NO acute events overnight, awake and following commands.  Patient and spouse met with palliative care and collectively decision was made to proceed with trach and PEG if needed Blood culture - NEG BAL - normal flora 11/21 no acute events overnight, hemoglobin remained stable 11/23 pain, discomfort, muscle spasms 11/24 CCS trach, cortrak, off precedex  11/25 PSV 5/5 for 8hrs 11/26 - In better place mentally, motivated to continue to get better.  Thankful for care being provided. C/o of ongoing neck and back pain. Asking not to tell him when on PSV so he doesn't get more anxious Did ATC briefly Went for MRI but too much motion artifcat despite precedex  and intermitten fent/versed  and propofol  .  11/27   needs MRI again but concerns for anxiety and motion again. LE weaker than UE. Now on  ATC Trach 40%. Afebrilre. Vent overnight. C collar remains. RN sas stage 2 decub slowly progressing to stage 3.   Family request change over from klonopin  to xanax , started on methadone   11/28 - Placed on dilaudid  gtt last evening for ongoing pain, Currently rates 5/10. MRI - spine nnil acute Increaesd respioratiory secretions and o2 needs up  =-> started Ceftriaxone  and Flagyl   (Ancef  needs to be restarted 12/2 and continued through 12/16) Cutlure growing ABUNDANT SERRATA  on 05/12/24 11/30 Sputum with serratia. Trach collar all day. Day 4 methadone . PRN dilaudid  decreased.  12/1 - urology signed off - plan for long term foley with outpt urology f/u- change catheter  over wire every 30 days 12/4 PCCM asked to see as pt more difficult to wake up , transferred  to ICU for low RR and hypercarbia  Interim History/Subjective:   Secretions appear decreased this morning Able to phonate with t PMV valve Afebrile Requesting more pain medications   Objective:   Blood pressure (!) 139/104, pulse 95, temperature 97.8 F (36.6 C), resp. rate (!) 0, height 6' (1.829 m), weight 82.8 kg, SpO2 90%.    FiO2 (%):  [28 %-50 %] 50 %   Intake/Output Summary (Last 24 hours) at 05/22/2024 1347 Last data filed at 05/22/2024 1200 Gross per 24 hour  Intake 2488.46 ml  Output 3800 ml  Net -1311.54 ml   Filed Weights   05/20/24 0500 05/21/24 0500 05/22/24 0500  Weight: 87.8 kg 86.5 kg 82.8 kg   Unchanged exam Well-built anxious appearing chronically ill-appearing man, lying in bed, no distress HEENT c-collar in place.  Size 6 tracheostomy , able to phonate with PMV well No accessory muscle use S1-S2 regular Both lower extremities weaker  3/5  Labs show mild hyponatremia, BUN/creatinine 1.3/2.8, stable, stable anemia  Resolved Problem List:   Postintubation hypotension Metabolic encephalopathy secondary to hypercarbia, resolved Acute  on chronic hypoxic and hypercapnic respiratory failure secondary to MSSA  ARDS (resolved), mucous plugging (improved)  and Septic pulmonary emboli  Nonoliguric AK VAP 05/10/24 - confirmed as SERRATIA 05/12/24  Assessment and Plan:   Soon after morning rounds, he developed left gaze preference right hemiparesis.  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 - Seen by neurology, repeat head CT shows blood in fourth ventricle and worsening hydrocephalus. -Neurosurgery contacted for EVD placement - He will be transferred to 4 N. -Blood pressure control with Cleviprex , received labetalol  x 1, SBP goal 130-150   Acute on chronic hypercarbic respiratory failure likely from oversedation (BZP,methadone  and gabapentin ), now back on MV Tracheostomy  dependence   On trach collar since 12/6,  Increased FiO2 requirements due to stroke, may need to be put back on vent eventually Tracheobronchial toilet   MSSA with Dissemination complicated by infective endocarditis of the tricuspid valve - last echo 04/15/24, lumbar osteomyelitis, Psoas abscess, septic pulmonary emboli, C-spine septic arthritis, C Spine Osteomyelitis- functional quadriplegia Serratia tracheobronchitis/HAP  Encourage cervical collar Serratia is now resistant to cefepime  and ceftriaxone , also has Pseudomonas, await sensitivities but will broaden to meropenem   CKD 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% with global hypokinesis of the left ventricle and grade 1 diastolic dysfunction  - Lasix  40 mg x1 Hypokalemia will be repleted Increase free water  to 200 Q4    History of substance abuse and chronic anxiety  -On methadone  with oxycodone  for breakthrough - Continue Xanax  and gabapentin   Urinary retention now has coude placed by urology 11/27 -Plan is to reassess by urology in 1 month  Anemia of critical illness -2 units PRBC on 12/5 dc subcutaneous heparin    Protein calorie malnutrition  Continue TFs , defer swallow evaluation now   Stage II pressure ulcer sacrum, present on admission Deconditioning Critical illness neuromyopathy    - PT and dispo on hold  Discussed findings with radiology, coordinated with neurology I have called and updated his spouse to unfortunate sudden change in his condition.  Explained possibility of needing procedures including EVD or decompressive hemicraniectomy  My independent critical care time was 35 minutes  Harden Staff MD. FCCP. [facility] Pager : [phone]  If no response to pager , please call [phone] until 7 pm After 7:00 pm call Elink  [phone]   05/22/2024

## 2024-05-22 NOTE — Progress Notes (Addendum)
 [facility] Progress/Transfer/Sign off note  Unfortunate 48 year old male, prolonged hospital stay, complex medical care, care was supposed to transfer from PCCM to TRH on 05/22/2024.  Kindly refer to Dr. [name], PCCM note from 05/21/2024 for details of hospitalization until that date.  12/10: As I walked into 2 M unit, patient's RN alerted that patient developed acute strokelike symptoms within the prior ~ 30 minutes.  Earlier this morning, patient was more communicative, moving both side of his limbs symmetrically, got his pain meds via core track, 2 ice chips.  Subsequently however he stopped moving his right limbs, left gaze preference.  We promptly activated code stroke.  Rapid response nurse was immediately at bedside.  Objective:  Vitals:   05/22/24 1140 05/22/24 1145  BP: (!) 129/92 (!) 126/90  Pulse: 93 93  Resp: 15 14  Temp:    SpO2: 90% 90%   General Exam: Young male, moderately built, frail, chronically ill looking, lying propped up in bed without painful or respiratory distress. Respiratory system: Has tracheostomy in place with trach collar.  No respiratory distress.  Slightly diminished breath sounds in the bases but otherwise clear to auscultation without wheezing, rhonchi or crackles Cardiovascular system: S1 and S2 heard, RRR.  No JVD, murmurs.  1-2+ bilateral pitting leg edema.  Telemetry personally reviewed: Sinus rhythm Abdominal exam: Nondistended, soft and nontender.  Normal bowel sounds heard Central nervous system: Somnolent but arousable, oriented to self, left gaze preference.  Unable to appreciate facial asymmetry or dysarthria, patient not saying much.  Dense right hemiplegia.  Also not moving his left leg much.  Labs CBC and BMP personally reviewed.  Significant for serum sodium of 147, BUN 113, creatinine 2.78, CRP 10.6, hemoglobin 9.2.  Assessment and plan:  Acute intracerebral hemorrhage/hemorrhagic CVA Cerebral edema with mass effect secondary to hemorrhagic  stroke. Code stroke activated.  Stat CT head was ordered.  Neurology was consulted.  Discussed with Dr. Voncile in person who was going to review CT and patient as the patient went down for CT head NPO.  Patient already has core track for feeding purposes which can be continued Since then, code stroke CT head results appreciated, 1. Acute intraparenchymal hemorrhage centered in the left thalamus measuring 2.6 x 3.4 x 2.9 cm, with associated left cerebral edema and mass effect causing 5 mm of right-to-left midline shift.2. Intraventricular extension into the left lateral ventricle, third ventricle, and fourth ventricle, with developing hydrocephalus.3. Diminished attenuation within the right cerebellar hemisphere, possibly secondary to beam hardening artifact, but an infarct cannot be excluded. 4. TBI risk stratification: High (MBIG 3) Communicated with PCCM and since patient remains critically ill, requested that they take over care onto [facility] service. Measures to address cerebral edema: Head elevation to 30 degrees, osmotherapy with mannitol or hypertonic saline but patient already hypernatremic and has renal insufficiency,?  Place back on vent for controlled hyperventilation.  Not sure he would be a candidate for decompressive craniectomy and ventricular drainage. Overall seems to be very poor prognosis given his complex other medical conditions and must consider discussing with family, palliative care consultation for goals of care (palliative care team already on board) Noticed that he got a dose of IV labetalol  20 mg x 1 and now started on Cleviprex  drip.  Do not see him on any blood thinners at this time.  Rest of details as per Dr. Cyndi progress note from 12/9.  Discussed with patient's RN, rapid response RN and Dr. Voncile at bedside.  Communicated with Dr. Alva  via secure chat.  He has accepted care onto CCM service.  TRH will sign off.  Kindly consult TRH back for any further  assistance.  Critical care time in evaluating patient and coordinating care: 60 minutes.  Trenda Mar, MD,  FACP, [facility], [facility], [facility]   [facility] Ad[facility] [place]     To contact the attending provider between 7A-7P or the covering provider during after hours 7P-7A, please log into the web site [email or web address] and access using universal [place] password for that web site. If you do not have the password, please call the hospital operator.

## 2024-05-22 NOTE — Progress Notes (Addendum)
 05/22/2024 Called and updated wife.  Consent obtained for CVL/Aline. Shared my concern regarding survivability of situation.  She would like update if any changes.  EVD placed and to 10 cmH2O  Started seizing on arrival  MRI and cEEG to be done.  Neuro aware.  Rolan Sharps MD PCCM

## 2024-05-22 NOTE — Progress Notes (Signed)
 With neuro status change MD at bedside and going to stat CT.  Plan to move to 4N when bed is cleaned for possible drain placement.

## 2024-05-22 NOTE — Progress Notes (Signed)
 STAT LTM EEG hooked up and running - no initial skin breakdown - push button tested - [facility] monitoring. MRI leads used. FZ,F4 leads off due to drain access.

## 2024-05-22 NOTE — Plan of Care (Signed)
°  Problem: Clinical Measurements: Goal: Ability to maintain clinical measurements within normal limits will improve Outcome: Progressing   Problem: Clinical Measurements: Goal: Will remain free from infection Outcome: Progressing   Problem: Clinical Measurements: Goal: Diagnostic test results will improve Outcome: Progressing   Problem: Clinical Measurements: Goal: Respiratory complications will improve Outcome: Progressing  Plan of care, monitoring, assessment, treatment and intervention (s) ongoing, see MAR see flowsheet

## 2024-05-22 NOTE — Procedures (Signed)
 EVD Insertion Procedure Note  [name] [name]  [id number]  [birth year]  Date:05/22/24  Time:1:39 PM    Provider Performing: Dino Sable    Procedure: RIGHT external ventricular drain  Indication(s) Intracerebral hemorrhage  Consent Risks of the procedure as well as the alternatives and risks of each were explained to the patient and/or caregiver.  Consent for the procedure was obtained and is signed in the bedside chart I spoke on the phone with his wife and she wanted everything done. She accepted the risk of hemorrhage and infection  Anesthesia Local anesthesia 1% 10cc   Time Out Verified patient identification, verified procedure, site/side was marked, verified correct patient position, special equipment/implants available, medications/allergies/relevant history reviewed, required imaging and test results available.   Sterile Technique Maximal sterile technique including full sterile barrier drape, hand hygiene, sterile gown, sterile gloves, mask, hair covering, sterile ultrasound probe cover (if used).   Procedure Description Area of catheter insertion was cleaned with chlorhexidine  and draped in sterile fashion. Twistdrill burrhole was made after a small incision was made and the dura incised. In a single pass access was gained to the RIGHT frontal horn. The drain was tunneled and connected in a sterile fashion to the drainage system. Incision was closed in single layer and drain secured.   Complications/Tolerance None; patient tolerated the procedure well.   EBL Minimal   Specimen(s) None

## 2024-05-22 NOTE — Procedures (Signed)
 Arterial Catheter Insertion Procedure Note  ADITYA NASTASI  [id number]  10-08-1975  Date:05/22/24  Time:5:10 PM    Provider Performing: Deward LELON Eastern    Procedure: Insertion of Arterial Line (63379) with US  guidance (23062)   Indication(s) Blood pressure monitoring and/or need for frequent ABGs  Consent Risks of the procedure as well as the alternatives and risks of each were explained to the patient and/or caregiver.  Consent for the procedure was obtained and is signed in the bedside chart  Anesthesia None   Time Out Verified patient identification, verified procedure, site/side was marked, verified correct patient position, special equipment/implants available, medications/allergies/relevant history reviewed, required imaging and test results available.   Sterile Technique Maximal sterile technique including full sterile barrier drape, hand hygiene, sterile gown, sterile gloves, mask, hair covering, sterile ultrasound probe cover (if used).   Procedure Description Area of catheter insertion was cleaned with chlorhexidine  and draped in sterile fashion. With real-time ultrasound guidance an arterial catheter was placed into the left radial artery.  Appropriate arterial tracings confirmed on monitor.     Complications/Tolerance None; patient tolerated the procedure well.   EBL Minimal   Specimen(s) None    Deward Eastern, AGACNP-BC [facility]  See Amion for personal pager PCCM on call pager [phone] until 7pm. Please call Elink 7p-7a. [phone]  05/22/2024 5:10 PM

## 2024-05-22 NOTE — Significant Event (Addendum)
 Rapid Response Event Note   Reason for Call :  Possible stroke, new right weakness  Initial Focused Assessment:  Patient admitted 11/2 history of MSSA bacteremia/PNA, infective endocarditis, substance abuse, septic pulmonary emboli, discitis/osteomyelitis. Patient progressing and transferred to [facility] service today. LKW 0930 when pt given meds. Approximately 1000, SLP went into room to work with patient and noted right sided weakness and asked primary RN to come assess patient. Rapid then to bedside, Code Stroke activated.   172/114 (131) HR 102 RR 22 O2 90% TC 6L 28%  CBG 106  NIH 23, see flowsheet for details.  Patient taken to CT. No TNK or IR as hemorrhage on scan.  IV labetalol  given then cleviprex  gtt started per order.  Care Plan: NIH/VS every 1h, SBP goal 130-150. Neurosurgery consulted by Neuro MD. [name] [name] with [facility].   [name] Chiquita POUR, RN

## 2024-05-22 NOTE — Progress Notes (Addendum)
 1305- Dr Janjua at bedside, pt draped and prepped in sterile protocol. Time out preformed. EVD Placed.  1404- Pt began seizing. Noticed rhythmic twitch of head down to the left, rhythmic twitching of L eyelid. Paul at bedside by 1405.  2mg  Versed  given per Deward Eastern, NP. Dr. Janjua and Dr. Voncile made aware.   1414-Pt still seizing, 2mg  Versed  given per Deward.   1435- Dr. Arora at bedside. Pt still seizing, 4mg  Versed  given per Dr. Arora, Keppra  also given at this time.  1445GLENWOOD Deward at bedside to place CVC. Time out preformed.   1455- EEG at bedside to place leads. Pt still seizing at this time, Dr. Voncile aware, New orders place.   1521-10mg  of Versed  given and Propofol  bolused and infusion started per order. Seizure appeared to stop around 1536. Entire team made aware.   1630- Paul at bedside to place a-line  1645- aline placed but trouble transducing.   1700-aline transduced and pressure documented  1728- reached out to Dr. Janjua for clarified BP parameters now that aline is in, SBP had climbed above >160, checked pupils, neuro change noted, see flowsheets. Warren CCM NP immediately at bedside, Dr. Rosslyn Deward, Dr, Claudene and Dr. Voncile immediatly notified via secure chat. Stat CT ordered, RT notified, Stroke RN Odell and Richardson at bedside.   1800- Significant other called by this RN and updated in change in further change in neuro status. Strongly encouraged her to come to bedside but Warren reports she does not have transportation to the hospital at this time. Ecouraged her to reach out to family members in the area   1845-Son at bedside with SW and foster family. [facility] RN contacted for update.   1930GLENWOOD Warren at bedside.

## 2024-05-22 NOTE — Progress Notes (Signed)
 At 10:20 Speech therapy came to me to ask about patient base line neglect.  I went to bedside and did NIH and patient was having significant neuro status change compared to shift assessment.  While notifying CCM at nursing station rapid response nurse was rounding on unit and notified her.  She completed and assessment and agreed to call code stroke due to neuro status change.  Last know well was during med pass at 0930.  At that point patient was answering questions and discussing plan for speech assessment and PT working with him.

## 2024-05-22 NOTE — Progress Notes (Signed)
 eLink Physician-Brief Progress Note Patient Name: [name] [name] DOB: 28-Feb-1976 MRN: [id number]   Date of Service  05/22/2024  HPI/Events of Note  Family requested an update. I spoke to them extensively regarding his current clinical status and the likely non-survivability of the ICH. All questions were answered. Hospital chaplain consulted at the family's request.  eICU Interventions  See above.        Azyah Flett U Yaroslav Gombos 05/22/2024, 8:17 PM

## 2024-05-22 NOTE — Progress Notes (Signed)
°   05/22/24 2148  Spiritual Encounters  Type of Visit Initial  Care provided to: [facility] partners present during encounter Nurse  Referral source Nurse (RN/NT/LPN)  Reason for visit End-of-life  OnCall Visit Yes  Spiritual Framework  Presenting Themes Impactful experiences and emotions  Community/Connection Family  Patient Stress Factors Major life changes  Family Stress Factors Exhausted;Health changes;Major life changes  Interventions  Spiritual Care Interventions Made Compassionate presence;Established relationship of care and support;Normalization of emotions  Intervention Outcomes  Outcomes Awareness of support    Chaplain responded to page for EOL support. Upon arrival,the Pt's son and mother were present at bedside. Chaplain provided emotional and spiritual support as the family received and processed the difficult information shared by the medical team.  Chaplain remained present with the family throughout their initial expression of grief and stayed with them until they prepared to leave the facility. Ongoing chaplain support remains available as needed.

## 2024-05-22 NOTE — Procedures (Signed)
 Central Venous Catheter Insertion Procedure Note  [name] [name]  [id number]  04-Dec-1975  Date:05/22/24  Time:3:03 PM   Provider Performing:Eddith Mentor [name]   Procedure: Insertion of Non-tunneled Central Venous Catheter(36556) with US  guidance (23062)   Indication(s) Difficult access  Consent Risks of the procedure as well as the alternatives and risks of each were explained to the patient and/or caregiver.  Consent for the procedure was obtained and is signed in the bedside chart  Anesthesia Topical only with 1% lidocaine    Timeout Verified patient identification, verified procedure, site/side was marked, verified correct patient position, special equipment/implants available, medications/allergies/relevant history reviewed, required imaging and test results available.  Sterile Technique Maximal sterile technique including full sterile barrier drape, hand hygiene, sterile gown, sterile gloves, mask, hair covering, sterile ultrasound probe cover (if used).  Procedure Description Area of catheter insertion was cleaned with chlorhexidine  and draped in sterile fashion.  With real-time ultrasound guidance a central venous catheter was placed into the left internal jugular vein. Nonpulsatile blood flow and easy flushing noted in all ports.  The catheter was sutured in place and sterile dressing applied.  Complications/Tolerance None; patient tolerated the procedure well. Chest X-ray is ordered to verify placement for internal jugular or subclavian cannulation.   Chest x-ray is not ordered for femoral cannulation.  EBL Minimal  Specimen(s) None      [name] [name], AGACNP-BC [place] [facility]  See Amion for personal pager PCCM on call pager [phone] until 7pm. Please call Elink 7p-7a. [phone]  05/22/2024 3:04 PM

## 2024-05-22 NOTE — Consult Note (Signed)
 NEUROLOGY CONSULT NOTE   Date of service: May 22, 2024 Patient Name: [name] [name] MRN:  [id number] DOB:  1976-02-07 Chief Complaint: code stroke - right sided weakness and aphasia of sudden onset Requesting Provider: Judeth Trenda BIRCH, MD  History of Present Illness  [name] [name] is a 48 y.o. male with hx of MSSA bacteremia, MSSA pneumonia, infective endocarditis, cervical through lumbar discitis/osteomyelitis, psoas abscess, substance abuse history in the past, noted to have septic pulmonary emboli and tricuspid valve endocarditis requiring intubation mechanical ventilation, was transferred out of the ICU but ICU was reconsulted back and was back in the ICU for AMS, tachypnea and tachycardia-PCCM note reviewed. This morning, last known well around 10 AM and the teams checked on him.  He spoke with the doctors, made them aware of his pain medications needs and was doing well.  Shortly after, he was noted to be aphasic, leftward gaze, right side flaccid paralysis for which a code stroke was activated.  Patient unable to provide any history at this time  LKW: 10 AM Modified rankin score: 5-Severe disability-bedridden, incontinent, needs constant attention IV Thrombolysis: ICH EVT: ICH ICH Score:2  NIHSS components Score: Comment  1a Level of Conscious 0[]  1[x]  2[]  3[]      1b LOC Questions 0[]  1[]  2[x]       1c LOC Commands 0[]  1[]  2[x]       2 Best Gaze 0[]  1[]  2[x]       3 Visual 0[]  1[]  2[x]  3[]      4 Facial Palsy 0[]  1[]  2[x]  3[]      5a Motor Arm - left 0[]  1[x]  2[]  3[]  4[]  UN[]    5b Motor Arm - Right 0[]  1[]  2[]  3[x]  4[]  UN[]    6a Motor Leg - Left 0[]  1[]  2[x]  3[]  4[]  UN[]    6b Motor Leg - Right 0[]  1[]  2[]  3[x]  4[]  UN[]    7 Limb Ataxia 0[x]  1[]  2[]  UN[]      8 Sensory 0[]  1[]  2[x]  UN[]      9 Best Language 0[]  1[]  2[]  3[x]      10 Dysarthria 0[]  1[]  2[x]  UN[]      11 Extinct. and Inattention 0[x]  1[]  2[]       TOTAL: 27      ROS  Unable to ascertain due to patient's  aphasia and altered mentation  Past History   Past Medical History:  Diagnosis Date   Anxiety    Back pain    Fracture of rib of right side 01/26/2015   Hypertension     Past Surgical History:  Procedure Laterality Date   TEE WITHOUT CARDIOVERSION N/A 01/21/2014   Procedure: TRANSESOPHAGEAL ECHOCARDIOGRAM (TEE) with propofol ;  Surgeon: Dorn JULIANNA Ross, MD;  Location: [facility];  Service: Endoscopy;  Laterality: N/A;   TRACHEOSTOMY TUBE PLACEMENT N/A 05/06/2024   Procedure: CREATION, TRACHEOSTOMY;  Surgeon: Sebastian Moles, MD;  Location: [facility] OR;  Service: General;  Laterality: N/A;    Family History: History reviewed. No pertinent family history.  Social History  reports that he has been smoking cigarettes. He has never used smokeless tobacco. He reports current drug use. Drug: Heroin. He reports that he does not drink alcohol.  No Known Allergies  Medications   Current Facility-Administered Medications:    [START ON 05/14/2024]  stroke: early stages of recovery book, , Does not apply, Once, Macaila Tahir, MD   acetaminophen  (TYLENOL ) 160 MG/5ML solution 650 mg, 650 mg, Per Tube, Q6H PRN, Baral, Dipti, MD, 650 mg at 05/21/24 1135   acetaminophen  (TYLENOL ) tablet  650 mg, 650 mg, Oral, Q4H PRN **OR** acetaminophen  (TYLENOL ) 160 MG/5ML solution 650 mg, 650 mg, Per Tube, Q4H PRN **OR** acetaminophen  (TYLENOL ) suppository 650 mg, 650 mg, Rectal, Q4H PRN, Tolulope Pinkett, MD   ALPRAZolam  (XANAX ) tablet 0.5 mg, 0.5 mg, Per Tube, TID, Baral, Dipti, MD, 0.5 mg at 05/21/24 2206   Ampicillin -Sulbactam (UNASYN ) 3 g in sodium chloride  0.9 % 100 mL IVPB, 3 g, Intravenous, Q6H, Manandhar, Sabina, MD, Stopped at 05/22/24 0604   Chlorhexidine  Gluconate Cloth 2 % PADS 6 each, 6 each, Topical, Daily, Jenna Maude BRAVO, NP, 6 each at 05/22/24 9066   clevidipine  (CLEVIPREX ) infusion 0.5 mg/mL, 0-21 mg/hr, Intravenous, Continuous, Voncile Isles, MD   labetalol  (NORMODYNE ) injection 20 mg, 20 mg,  Intravenous, Once **AND** clevidipine  (CLEVIPREX ) infusion 0.5 mg/mL, 0-21 mg/hr, Intravenous, Continuous, Voncile Isles, MD   collagenase  (SANTYL ) ointment, , Topical, Daily, [name] Harden GAILS, MD, 1 Application at 05/22/24 0933   feeding supplement (NEPRO CARB STEADY) liquid 1,000 mL, 1,000 mL, Per Tube, Continuous, Jenna Maude BRAVO, NP, Last Rate: 50 mL/hr at 05/22/24 0900, Infusion Verify at 05/22/24 0900   folic acid  (FOLVITE ) tablet 1 mg, 1 mg, Per Tube, Daily, Jenna Maude BRAVO, NP, 1 mg at 05/22/24 9077   free water  200 mL, 200 mL, Per Tube, Q4H, Alva, Rakesh V, MD   gabapentin  (NEURONTIN ) 250 MG/5ML solution 400 mg, 400 mg, Per Tube, Q8H, Baral, Dipti, MD, 400 mg at 05/22/24 0544   guaiFENesin  (ROBITUSSIN) 100 MG/5ML liquid 5 mL, 5 mL, Per Tube, Q4H, Jenna Maude BRAVO, NP, 5 mL at 05/22/24 0803   ipratropium-albuterol  (DUONEB) 0.5-2.5 (3) MG/3ML nebulizer solution 3 mL, 3 mL, Nebulization, Q4H PRN, Jenna Maude BRAVO, NP, 3 mL at 05/03/24 9277   lidocaine  (LIDODERM ) 5 % 1 patch, 1 patch, Transdermal, Q24H, Babcock, Peter E, NP, 1 patch at 05/21/24 1135   methadone  (DOLOPHINE ) tablet 10 mg, 10 mg, Per Tube, Daily, Baral, Dipti, MD, 10 mg at 05/22/24 9077   multivitamin with minerals tablet 1 tablet, 1 tablet, Per Tube, Daily, Babcock, Peter E, NP, 1 tablet at 05/22/24 9077   nutrition supplement (JUVEN) (JUVEN) powder packet 1 packet, 1 packet, Per Tube, BID BM, Alva, Rakesh V, MD, 1 packet at 05/22/24 9072   ondansetron  (ZOFRAN ) injection 4 mg, 4 mg, Intravenous, Q6H PRN, Babcock, Peter E, NP, 4 mg at 05/03/24 1614   Oral care mouth rinse, 15 mL, Mouth Rinse, 4 times per day, [name] Harden GAILS, MD, 15 mL at 05/22/24 0816   Oral care mouth rinse, 15 mL, Mouth Rinse, PRN, [name] Harden GAILS, MD   oxyCODONE  (Oxy IR/ROXICODONE ) immediate release tablet 10 mg, 10 mg, Per Tube, Q4H PRN, Baral, Dipti, MD, 10 mg at 05/22/24 0810   pantoprazole  (PROTONIX ) injection 40 mg, 40 mg, Intravenous, Q24H, Alva, Rakesh V,  MD, 40 mg at 05/21/24 2205   pantoprazole  (PROTONIX ) injection 40 mg, 40 mg, Intravenous, QHS, Marqueta Pulley, MD   polyethylene glycol (MIRALAX  / GLYCOLAX ) packet 17 g, 17 g, Per Tube, BID, Jenna Maude BRAVO, NP, 17 g at 05/21/24 9062   potassium chloride  (KLOR-CON ) packet 40 mEq, 40 mEq, Per Tube, Once, [name] Harden GAILS, MD   senna-docusate (Senokot-S) tablet 1 tablet, 1 tablet, Oral, BID, Lawren Sexson, MD   sennosides (SENOKOT) 8.8 MG/5ML syrup 10 mL, 10 mL, Per Tube, BID, Jenna Maude E, NP, 10 mL at 05/20/24 2020   sodium chloride  flush (NS) 0.9 % injection 10-40 mL, 10-40 mL, Intracatheter, PRN, Babcock, Peter E, NP  Vitals   Vitals:   05/22/24 0700 05/22/24 0800 05/22/24 0900 05/22/24 1030  BP: (!) 156/114 (!) 142/128 (!) 146/91 (!) 172/114  Pulse: 83 (!) 109 95 (!) 102  Resp: 10 15 14 13   Temp:  98.4 F (36.9 C)    TempSrc:      SpO2: 93% 91% 92% 90%  Weight:      Height:        Body mass index is 24.76 kg/m.   Physical Exam   GEN: Sick looking man with tracheostomy in place HEENT: Normocephalic atraumatic CVS: Regular rate rhythm Respiratory: Trach Neurological exam Drowsy, opens eyes to loud voice Does not follow commands Nonverbal Cranial nerves: Pupils equal round react light, left gaze forced deviation, does not blink to threat from the right, blinks to threat from the left, facial asymmetry with right-sided facial drooping Motor examination with minimal flicker to noxious stimulation in the right upper extremity.  Somewhat more pronounced flicker to noxious stimulation on the right lower extremity.  Left upper extremity is antigravity but does not keep it up for 10 seconds.  Left lower extremity is also weak and not up against gravity for 5 seconds. Sensory exam: Diminished sensation on the right. Coordination difficult to assess given his mentation  Labs/Imaging/Neurodiagnostic studies   CBC:  Recent Labs  Lab 06/02/24 0518 06/02/2024 1024 05/20/24 0448  05/21/24 0406 05/22/24 0432  WBC 9.4   < > 8.4 9.0 10.5  NEUTROABS 7.7  --  6.6  --   --   HGB 8.9*   < > 8.8* 9.2* 9.2*  HCT 28.1*   < > 28.0* 29.6* 29.7*  MCV 92.1   < > 93.6 94.0 94.9  PLT 206   < > 211 219 247   < > = values in this interval not displayed.   Basic Metabolic Panel:  Lab Results  Component Value Date   NA 147 (H) 05/22/2024   K 3.7 05/22/2024   CO2 33 (H) 05/22/2024   GLUCOSE 113 (H) 05/22/2024   BUN 113 (H) 05/22/2024   CREATININE 2.78 (H) 05/22/2024   CALCIUM  8.6 (L) 05/22/2024   GFRNONAA 28 (L) 05/22/2024   GFRAA >60 11/09/2016    HgbA1c:  Lab Results  Component Value Date   HGBA1C 4.6 (L) 04/25/2024   Urine Drug Screen:     Component Value Date/Time   LABOPIA NONE DETECTED 04/15/2024 0009   COCAINSCRNUR POSITIVE (A) 04/15/2024 0009   LABBENZ POSITIVE (A) 04/15/2024 0009   AMPHETMU NONE DETECTED 04/15/2024 0009   THCU NONE DETECTED 04/15/2024 0009   LABBARB NONE DETECTED 04/15/2024 0009    Alcohol Level     Component Value Date/Time   ETH <15 04/15/2024 0050   INR  Lab Results  Component Value Date   INR 1.4 (H) 05/17/2024   APTT  Lab Results  Component Value Date   APTT 56 (H) 05/17/2024    CT Head without contrast(Personally reviewed): Left thalamic ICH with IVH, approximately 12 cc bleed.  Midline shift right to left 5 mm.  Diminished attenuation within the right cerebellar hemisphere-likely due to beam hardening artifact but infarct cannot be excluded.  IVH extension into left lateral ventricle, third ventricle and fourth ventricle with developing hydrocephalus.  CT angio Head and Neck with contrast(Personally reviewed): Not performed due to deranged renal function  ASSESSMENT   [name] [name] is a 48 y.o. male with past history as above, being treated for infective endocarditis, septic pulmonary emboli, psoas abscess and  whole spine osteomyelitis, had sudden onset of left gaze, aphasia and flaccid right paralysis.  CT head  was done stat as a code stroke was activated.  CT head showed a left thalamic ICH with IVH and developing hydrocephalus with 5 mm left-to-right midline shift. I suspect underlying septic emboli causing hemorrhagic infarct or an ischemic infarct due to septic emboli with hemorrhagic transformation. Due to the developing hydrocephalus-neurosurgery consult stat is recommended as well.  Impression: Intracerebral hemorrhage-left thalamus Intraventricular hemorrhage Hydrocephalus Aphasia Hemiplegia  RECOMMENDATIONS  Mainstay of treatment will be blood pressure management-SBP goal 130-150. Labetalol  as needed followed by Cleviprex  drip has been ordered and initiated. No antiplatelets or anticoagulants at this time Neurosurgical consultation stat for possible consideration for EVD Stroke workup order set has been ordered Repeat head CT in 4 to 6 hours. MRI brain without contrast MRI head without contrast Carotid Dopplers Repeat 2D echo  Addendum Case discussed with Dr. Janjua, who examined the patient.  Upon his examination, the patient's mental status is much worse.  He discussed surgical options including EVD placement and decompressive hemicrania with the wife. At this point, neurosurgery is going to proceed with EVD.  Being transferred to 4 N. ICU after stat CT.  Stat CT showed increased interval hematoma size, slightly increased size of the fourth ventricular bleed, mildly worsened midline shift and increased caliber of the lateral ventricles particularly on the right which may reflect evolving hydrocephalus Post EVD care per neurosurgery Appreciate PCCM care of the patient    ______________________________________________________________________    Signed, Eligio Lav, MD [facility]   CRITICAL CARE ATTESTATION Performed by: Eligio Lav, MD Total critical care time: 115 minutes Critical care time was exclusive of separately billable procedures and treating other  patients and/or supervising APPs/Residents/Students Critical care was necessary to treat or prevent imminent or life-threatening deterioration. This patient is critically ill and at significant risk for neurological worsening and/or death and care requires constant monitoring. Critical care was time spent personally by me on the following activities: development of treatment plan with patient and/or surrogate as well as nursing, discussions with consultants, evaluation of patient's response to treatment, examination of patient, obtaining history from patient or surrogate, ordering and performing treatments and interventions, ordering and review of laboratory studies, ordering and review of radiographic studies, pulse oximetry, re-evaluation of patient's condition, participation in multidisciplinary rounds and medical decision making of high complexity in the care of this patient.

## 2024-05-22 NOTE — Progress Notes (Signed)
 Patient blood pressure has had fluctuations Called back to the ICU to evaluate the patient Both his pupils are now fixed and dilated No corneal responses Mild cough present. No gag. Breathing with the ventilator Absent oculocephalics No response to noxious stimulation  Stat CT head completed and reviewed-worsening bleed, worsening hydrocephalus  Case discussed with neurosurgery. At this point, any further operative care will be futile as symptoms are likely due to brainstem compression and with large size of the bleed, and hydrocephalus along with brainstem compression and herniation, he is not likely to survive this insult.  Recommend getting in touch with family and updating them on the situation so that end-of-life measures can be discussed with the family and initiated.  Plan discussed with Dr. Janjua and Dr. Claudene as well as PCCM APP [facility], patient's bedside RN and stroke RN.   Eligio Lav, MD Neurology  Additional 20 minutes of CC time

## 2024-05-22 NOTE — Progress Notes (Signed)
 RT assisted with transporting pt on vent to CT and back to 4N w/o any complications.

## 2024-05-22 NOTE — Progress Notes (Signed)
 Report given to receiving RN after she was caught up on transfer.

## 2024-05-23 ENCOUNTER — Inpatient Hospital Stay (HOSPITAL_COMMUNITY): Payer: MEDICAID

## 2024-05-23 DIAGNOSIS — I613 Nontraumatic intracerebral hemorrhage in brain stem: Secondary | ICD-10-CM

## 2024-05-23 DIAGNOSIS — I10 Essential (primary) hypertension: Secondary | ICD-10-CM

## 2024-05-23 DIAGNOSIS — R569 Unspecified convulsions: Secondary | ICD-10-CM

## 2024-05-23 DIAGNOSIS — I609 Nontraumatic subarachnoid hemorrhage, unspecified: Secondary | ICD-10-CM

## 2024-05-23 DIAGNOSIS — I6389 Other cerebral infarction: Secondary | ICD-10-CM

## 2024-05-23 DIAGNOSIS — I739 Peripheral vascular disease, unspecified: Secondary | ICD-10-CM

## 2024-05-23 DIAGNOSIS — Z982 Presence of cerebrospinal fluid drainage device: Secondary | ICD-10-CM

## 2024-05-23 LAB — CBC
HCT: 29.4 % — ABNORMAL LOW (ref 39.0–52.0)
Hemoglobin: 8.9 g/dL — ABNORMAL LOW (ref 13.0–17.0)
MCH: 29.1 pg (ref 26.0–34.0)
MCHC: 30.3 g/dL (ref 30.0–36.0)
MCV: 96.1 fL (ref 80.0–100.0)
Platelets: 335 K/uL (ref 150–400)
RBC: 3.06 MIL/uL — ABNORMAL LOW (ref 4.22–5.81)
RDW: 17.7 % — ABNORMAL HIGH (ref 11.5–15.5)
WBC: 11.7 K/uL — ABNORMAL HIGH (ref 4.0–10.5)
nRBC: 0 % (ref 0.0–0.2)

## 2024-05-23 LAB — ECHOCARDIOGRAM COMPLETE
AR max vel: 2.12 cm2
AV Area VTI: 2.39 cm2
AV Area mean vel: 2.49 cm2
AV Mean grad: 2 mmHg
AV Peak grad: 3.5 mmHg
Ao pk vel: 0.93 m/s
Area-P 1/2: 3.15 cm2
Calc EF: 50.7 %
Height: 72 in
MV VTI: 2.2 cm2
S' Lateral: 3.4 cm
Single Plane A2C EF: 64 %
Single Plane A4C EF: 31.2 %
Weight: 2955.93 [oz_av]

## 2024-05-23 LAB — BASIC METABOLIC PANEL WITH GFR
Anion gap: 8 (ref 5–15)
BUN: 119 mg/dL — ABNORMAL HIGH (ref 6–20)
CO2: 33 mmol/L — ABNORMAL HIGH (ref 22–32)
Calcium: 8.2 mg/dL — ABNORMAL LOW (ref 8.9–10.3)
Chloride: 104 mmol/L (ref 98–111)
Creatinine, Ser: 2.61 mg/dL — ABNORMAL HIGH (ref 0.61–1.24)
GFR, Estimated: 30 mL/min — ABNORMAL LOW (ref >60)
Glucose, Bld: 114 mg/dL — ABNORMAL HIGH (ref 70–99)
Potassium: 3.6 mmol/L (ref 3.5–5.1)
Sodium: 145 mmol/L (ref 135–145)

## 2024-05-23 MED ORDER — POLYVINYL ALCOHOL 1.4 % OP SOLN
1.0000 [drp] | Freq: Four times a day (QID) | OPHTHALMIC | Status: DC | PRN
  Filled 2024-05-23: qty 15

## 2024-05-23 MED ORDER — ORAL CARE MOUTH RINSE: 15.0000 mL | OROMUCOSAL | Status: DC | PRN

## 2024-05-23 MED ORDER — ORAL CARE MOUTH RINSE
15.0000 mL | OROMUCOSAL | Status: DC
  Administered 2024-05-23 (×4): 15 mL via OROMUCOSAL

## 2024-05-24 LAB — CULTURE, RESPIRATORY W GRAM STAIN

## 2024-05-25 LAB — POCT I-STAT 7, (LYTES, BLD GAS, ICA,H+H)
Acid-Base Excess: 10 mmol/L — ABNORMAL HIGH (ref 0.0–2.0)
Acid-Base Excess: 6 mmol/L — ABNORMAL HIGH (ref 0.0–2.0)
Bicarbonate: 37.3 mmol/L — ABNORMAL HIGH (ref 20.0–28.0)
Bicarbonate: 35.3 mmol/L — ABNORMAL HIGH (ref 20.0–28.0)
Calcium, Ion: 1.21 mmol/L (ref 1.15–1.40)
Calcium, Ion: 1.26 mmol/L (ref 1.15–1.40)
HCT: 28 % — ABNORMAL LOW (ref 39.0–52.0)
HCT: 30 % — ABNORMAL LOW (ref 39.0–52.0)
Hemoglobin: 9.5 g/dL — ABNORMAL LOW (ref 13.0–17.0)
Hemoglobin: 10.2 g/dL — ABNORMAL LOW (ref 13.0–17.0)
O2 Saturation: 100 %
O2 Saturation: 100 %
Patient temperature: 97.5
Patient temperature: 97.5
Potassium: 3.5 mmol/L (ref 3.5–5.1)
Potassium: 3.6 mmol/L (ref 3.5–5.1)
Sodium: 148 mmol/L — ABNORMAL HIGH (ref 135–145)
Sodium: 150 mmol/L — ABNORMAL HIGH (ref 135–145)
TCO2: 39 mmol/L — ABNORMAL HIGH (ref 22–32)
TCO2: 38 mmol/L — ABNORMAL HIGH (ref 22–32)
pCO2 arterial: 66.6 mmHg (ref 32–48)
pCO2 arterial: 75.5 mmHg (ref 32–48)
pH, Arterial: 7.353 (ref 7.35–7.45)
pH, Arterial: 7.275 — ABNORMAL LOW (ref 7.35–7.45)
pO2, Arterial: 205 mmHg — ABNORMAL HIGH (ref 83–108)
pO2, Arterial: 266 mmHg — ABNORMAL HIGH (ref 83–108)

## 2024-05-29 LAB — POCT I-STAT 7, (LYTES, BLD GAS, ICA,H+H)
Acid-Base Excess: 8 mmol/L — ABNORMAL HIGH (ref 0.0–2.0)
Bicarbonate: 32.9 mmol/L — ABNORMAL HIGH (ref 20.0–28.0)
Calcium, Ion: 1.23 mmol/L (ref 1.15–1.40)
HCT: 28 % — ABNORMAL LOW (ref 39.0–52.0)
Hemoglobin: 9.5 g/dL — ABNORMAL LOW (ref 13.0–17.0)
O2 Saturation: 100 %
Patient temperature: 97.5
Potassium: 3.5 mmol/L (ref 3.5–5.1)
Sodium: 147 mmol/L — ABNORMAL HIGH (ref 135–145)
TCO2: 34 mmol/L — ABNORMAL HIGH (ref 22–32)
pCO2 arterial: 43.1 mmHg (ref 32–48)
pH, Arterial: 7.488 — ABNORMAL HIGH (ref 7.35–7.45)
pO2, Arterial: 312 mmHg — ABNORMAL HIGH (ref 83–108)

## 2024-06-13 NOTE — Progress Notes (Signed)
 Patient was transported too and from CT without  any complications

## 2024-06-13 NOTE — Procedures (Signed)
 Apnea test performed with O2 tubing down trach @ 8L. Pt left for 8 mins then ABG pulled.  Results pending.

## 2024-06-13 NOTE — Progress Notes (Signed)
 NAME:  [name] [name], MRN:  [id number], DOB:  Oct 24, 1975, LOS: 39 ADMISSION DATE:  04/14/2024, CONSULTATION DATE:  04/14/2024 REFERRING MD:  S[place] - [facility] EDP, CHIEF COMPLAINT:  AMS  History of Present Illness:  49 year old man who presented to [facility] ED 11/2 with AMS. PMHx significant for MSSA bacteremia, MSSA PNA, infective endocarditis, cervical through lumbar osteomyelitis, psoas abscess, severe protein calorie malnutrition/FTT, substance abuse. Recent admission 10/2 - 10/13 for sepsis, widespread MSSA.  Arrived to [facility] ED covered in feces, altered and with poor airway protection; he was subsequently intubated. CXR demonstrated RLL opacities. Confirmed bilateral septic pulmonary emboli, tricuspid valve endocarditis.  Required intubation and mechanical ventilation. Clinically progressed and was transferred out of ICU 11/10.  Reconsulted 11/13 for AMS, tachypnea and tachycardia.   Pertinent Medical History:  MSSA bacteremia MSSA PNA Lumbar osteomyelitis  Psoas abscess  Severe protein calorie malnutrition FTT in adult  Significant Hospital Events: Including procedures, antibiotic start and stop dates in addition to other pertinent events   11/2 [facility] ED w AMS. Intubated. Started on vanc mero. Txf request to [place] to [facility]  11/3 Extubated, developed respiratory distress requiring reintubation. Brief cardiac arrest during intubation.  11/3 Transthoracic echocardiogram > LVEF 40-45%, global hypokinesis, grade 1 diastolic dysfunction, normal RV function.  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  No mitral or aortic valve involvement 11/3 CT Chest, abdomen, pelvis >> numerous bilateral cavitary and solid nodules consistent with septic emboli, left lower lobe consolidation with a small pleural effusion, right basilar atelectasis.  Moderate free fluid in the pelvis.  Erosive changes at T5-T6, L2-L3, L4-L5 suggestive of discitis 11/3 CT Head >> no acute abnormality.   Opacified left maxillary sinus 11/10 Transferred out of ICU. 11/13 PCCM reconsulted for decreased mental status, tachycardia, tachypnea. Started diuretics 11/16 Poorly responsive, transferred back to ICU and intubated for the third time. Pressors started post intubation. Renal function worse. UOP poor.  Trach aspirate - Candida Trorpicals and Calcians 11/20 NO acute events overnight, awake and following commands.  Patient and spouse met with palliative care and collectively decision was made to proceed with trach and PEG if needed Blood culture - NEG BAL - normal flora 11/21 no acute events overnight, hemoglobin remained stable 11/23 pain, discomfort, muscle spasms 11/24 CCS trach, cortrak, off precedex  11/25 PSV 5/5 for 8hrs 11/26 - In better place mentally, motivated to continue to get better.  Thankful for care being provided. C/o of ongoing neck and back pain. Asking not to tell him when on PSV so he doesn't get more anxious Did ATC briefly Went for MRI but too much motion artifcat despite precedex  and intermitten fent/versed  and propofol  .  11/27   needs MRI again but concerns for anxiety and motion again. LE weaker than UE. Now on  ATC Trach 40%. Afebrilre. Vent overnight. C collar remains. RN sas stage 2 decub slowly progressing to stage 3.   Family request change over from klonopin  to xanax , started on methadone   11/28 - Placed on dilaudid  gtt last evening for ongoing pain, Currently rates 5/10. MRI - spine nnil acute Increaesd respioratiory secretions and o2 needs up  =-> started Ceftriaxone  and Flagyl   (Ancef  needs to be restarted 12/2 and continued through 12/16) Cutlure growing ABUNDANT SERRATA  on 05/12/24 11/30 Sputum with serratia. Trach collar all day. Day 4 methadone . PRN dilaudid  decreased.  12/1 - urology signed off - plan for long term foley with outpt urology f/u- change catheter  over wire every 30 days 12/4 PCCM asked to see as pt more difficult to wake up , transferred  to ICU for low RR and hypercarbia 12/10 worsening mental status, CT head with non surviveable ICH with IV extension, EVD placed 12/11 progression of ICH with generalized edema  Interim History/Subjective:  Worsening ICH. Nonsurviveable. Family aware, en route today.   Objective:   Blood pressure (!) 127/99, pulse 61, temperature (!) 97.2 F (36.2 C), temperature source Axillary, resp. rate 12, height 6' (1.829 m), weight 83.8 kg, SpO2 98%.    Vent Mode: PRVC FiO2 (%):  [28 %-50 %] 40 % Set Rate:  [12 bmp-16 bmp] 12 bmp Vt Set:  [620 mL] 620 mL PEEP:  [5 cmH20] 5 cmH20 Plateau Pressure:  [14 cmH20-15 cmH20] 15 cmH20   Intake/Output Summary (Last 24 hours) at 05/16/2024 0732 Last data filed at 05/28/2024 0700 Gross per 24 hour  Intake 1584.21 ml  Output 3280 ml  Net -1695.79 ml   Filed Weights   05/21/24 0500 05/22/24 0500 05/24/2024 0245  Weight: 86.5 kg 82.8 kg 83.8 kg   Physical Exam: General: Young adult male, resting in bed, in NAD. Neuro: Critically ill, non responsive. HEENT: [place]/AT. Sclerae anicteric. Trach C/D/I. Cardiovascular: RRR, no M/R/G.  Lungs: Respirations even and unlabored.  CTA bilaterally, No W/R/R. Abdomen: BS x 4, soft, NT/ND.  Musculoskeletal: No gross deformities, no edema.   Resolved Problem List:   Postintubation hypotension Metabolic encephalopathy secondary to hypercarbia, resolved Acute  on chronic hypoxic and hypercapnic respiratory failure secondary to MSSA  ARDS (resolved), mucous plugging (improved)  and Septic pulmonary emboli  Nonoliguric AK VAP 05/10/24 - confirmed as SERRATIA 05/12/24  Assessment and Plan:   Large ICH centered around left thalamus with 5 mm midline shift and intraventricular extension. Now with progression of ICH and ventriculomegaly despite EVD along with generalized edema. Acute on chronic hypercarbic respiratory failure likely from oversedation (BZP,methadone  and gabapentin ), s/p trach Tracheostomy dependence   MSSA with Dissemination complicated by infective endocarditis of the tricuspid valve - last echo 04/15/24, lumbar osteomyelitis, Psoas abscess, septic pulmonary emboli, C-spine septic arthritis, C Spine Osteomyelitis- functional quadriplegia Serratia tracheobronchitis/HAP CKD 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% with global hypokinesis of the left ventricle and grade 1 diastolic dysfunction History of substance abuse and chronic anxiety Urinary retention s/p coude placed by urology 11/27 Stage II pressure ulcer sacrum, present on admission Deconditioning Critical illness neuromyopathy  Protein calorie malnutrition   Discussion: - Unfortunately this is non-surviveable. - Family en route to hospital today. - Maintain full support given above. - Continue all current meds while awaiting family. - Low dose NE as needed for goal MAP > 65. - Will transition to comfort once family arrives. - Would advocate for DNR in the interim.    Sammi Gore, PA - [facility] For pager details, please see AMION or use Epic chat  After 1900, please call [facility] for cross coverage needs 05/18/2024, 7:38 AM

## 2024-06-13 NOTE — Procedures (Signed)
 Patient Name: [name] [name]  MRN: [id number]  Epilepsy Attending: Arlin MALVA Krebs  Referring Physician/Provider: Judithe Rocky BROCKS, NP  Duration: 05/22/2024 1534 to 05/19/2024 1023  Patient history: 49 y.o. male being treated for infective endocarditis, septic pulmonary emboli, psoas abscess and whole spine osteomyelitis, had sudden onset of left gaze, aphasia and flaccid right paralysis. EEG to evaluate for seizure.  Level of alertness:  comatose/ lethargic   AEDs during EEG study: LEV, PHT, Propofol   Technical aspects: This EEG study was done with scalp electrodes positioned according to the 10-20 International system of electrode placement. Electrical activity was reviewed with band pass filter of 1-70Hz , sensitivity of 7 uV/mm, display speed of 78mm/sec with a 60Hz  notched filter applied as appropriate. EEG data were recorded continuously and digitally stored.  Video monitoring was available and reviewed as appropriate.  Description: EEG showed near continuous generalized 5-9hz  theta-alpha activity admixed with 1-2 seconds of generalized attenuation. After around 2300 on 05/22/2024, EEG worsened and showed generalized background suppression. Hyperventilation and photic stimulation were not performed.     ABNORMALITY - Continuous slow, generalized - Background suppression, generalized  IMPRESSION: This study was initially suggestive of severe diffuse encephalopathy. After around 2300 on 05/22/2024, EEG worsened and was suggestive of profound diffuse encephalopathy. No seizures or epileptiform discharges were seen throughout the recording.  Aydin Hink O Kerry Odonohue

## 2024-06-13 NOTE — Progress Notes (Signed)
 Adult Brain Death Determination  Time of Examination: 05/22/2024 1:40 PM  No Evidence of /Cause of Reversible CNS Depression  Core temperature must be greater >36 degrees. Last temp: Temp: (!) 97.5 F (36.4 C) (Note: If unable to achieve normothermia after 12 hours of temperature management, may consider proceeding with Brain Death Evaluation.):    yes  Evidence of severe metabolic perturbations that could potentate CNS depression. Consider glucose, Na, creatinine, PaCO2, SaO2.:    Absent  Evidence of drugs, by history or measurement, that could potentiate central nervous system depression: narcotics, ethanol, benzodiazepines, barbiturates, neuromuscular blockade.:     Absent  Absence of Cortical Function  GCS = 3:    yes  Absence of Brain Stem Reflexes and Responses  Pupils light-fixed    yes  Corneal reflexes:    Absent  Response to upper and lower airway stimulation, such as pharyngeal and endotracheal suctioning.:    Absent  Ocular response to head turning (eye movement).    Absent  Absence of Spontaneous Respirations  (Apnea test performed per Brain Death Policy. If not met due to hemodynamic/ventilatory instability, then perform EEG, TCD, or cerebral blow flow studies.)  1.   Spontaneous Respirations   Absent  2.   PaCO2 at start of apnea test:  44  3.   PaCO2 at end of apnea test:  75  4.   CO2 rise of 20 or greater from baseline:   yes  Document Confirmatory Test Utilized: (Optional) Nuclear cerebral flow, cerebral angiography (CT/MR angio), transcranial Doppler ultrasound, EEG, SSEP (record results).  Test results (if available):  CT with devastating injury  Patient pronounced dead by neurological criteria at 13:40 on 05/14/2024.  Toribio JAYSON Sharps, MD 06/01/2024 1:40 PM

## 2024-06-13 NOTE — Progress Notes (Signed)
 LTM VIDEO EEG discontinued - no skin breakdown at [facility].

## 2024-06-13 NOTE — Progress Notes (Signed)
 ID brief note   Hospital course now complicated with ICH with hydrocephalus and midline shift. Neurosurgery consulted and EVD placed. Neurosurgery, Neurology, PCCM have evaluated and deemed to be non survivable condition. Plan for comfort care once family arrives.   ID will so at this time  Annalee Orem, MD Infectious Disease Physician [facility] [address] [place], [place] [address] Phone: [phone]  Fax: [phone]

## 2024-06-13 NOTE — Progress Notes (Signed)
 History  49 year old man who presented to [facility] ED 11/2 with AMS. PMHx significant for MSSA bacteremia, MSSA PNA, infective endocarditis, cervical through lumbar osteomyelitis, psoas abscess, severe protein calorie malnutrition/FTT, substance abuse. Recent admission 10/2 - 10/13 for sepsis, widespread MSSA. History of IV drug use.    Arrived to [facility] ED covered in feces, altered and with poor airway protection; he was subsequently intubated. CXR demonstrated RLL opacities. Confirmed bilateral septic pulmonary emboli, tricuspid valve endocarditis.  Required intubation and mechanical ventilation. Clinically progressed and was transferred out of ICU 11/10.   Reconsulted 11/13 for AMS, tachypnea and tachycardia.   11/26 the patients condition started to improve.  12/4 PCCM asked to see as pt more difficult to wake up , transferred to 4M ICU for low RR and hypercarbia  12/10 worsening mental status, CT head with non surviveable ICH with IV extension. The patient was transferred to the Neuro ICU where an EVD was placed.   *detailed hospital events below.  Assessment/Plan: The patient is currently unresponsive with a GCS of 3. No response to toxic stimuli of any kind. Per the nursing report, the gag reflex was lost at 0100 last night, raising concern for significant decline in brainstem function. The EVD reportedly ceased producing around 0100 as well, though it has since resumed output. At approximately 0715 this morning about 16mL was drained from the patient. The current clinical picture is bleak and prognosis is poor. Unfortunately, there seems to be little else that can be done neurosurgically to benefit the patient. For now, continue neurological monitoring.   I spoke with the nurse who informed me that family was contacted. Unfortunately family seems to live far away and travel has proven difficult. Despite this the nurse affirmed that they have been well informed about the patients conditions and  are aware of the severity.   LOS: 1 day  No change in plan of care since the last assessment.  Subjective: The patient is unresponsive.   Significant Hospital Events: Including procedures, antibiotic start and stop dates in addition to other pertinent events   11/2 [facility] ED w AMS. Intubated. Started on vanc mero. Txf request to [place] to [facility]  11/3 Extubated, developed respiratory distress requiring reintubation. Brief cardiac arrest during intubation.  11/3 Transthoracic echocardiogram > LVEF 40-45%, global hypokinesis, grade 1 diastolic dysfunction, normal RV function.  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  No mitral or aortic valve involvement 11/3 CT Chest, abdomen, pelvis >> numerous bilateral cavitary and solid nodules consistent with septic emboli, left lower lobe consolidation with a small pleural effusion, right basilar atelectasis.  Moderate free fluid in the pelvis.  Erosive changes at T5-T6, L2-L3, L4-L5 suggestive of discitis 11/3 CT Head >> no acute abnormality.  Opacified left maxillary sinus 11/10 Transferred out of ICU. 11/13 PCCM reconsulted for decreased mental status, tachycardia, tachypnea. Started diuretics 11/16 Poorly responsive, transferred back to ICU and intubated for the third time. Pressors started post intubation. Renal function worse. UOP poor.  Trach aspirate - Candida Trorpicals and Calcians 11/20 NO acute events overnight, awake and following commands.  Patient and spouse met with palliative care and collectively decision was made to proceed with trach and PEG if needed Blood culture - NEG BAL - normal flora 11/21 no acute events overnight, hemoglobin remained stable 11/23 pain, discomfort, muscle spasms 11/24 CCS trach, cortrak, off precedex  11/25 PSV 5/5 for 8hrs 11/26 - In better place mentally, motivated to continue to get better.  Thankful for care being provided. C/o of ongoing neck and back pain. Asking not to tell  him when on PSV so he doesn't get more anxious Did ATC briefly Went for MRI but too much motion artifcat despite precedex  and intermitten fent/versed  and propofol  .  11/27   needs MRI again but concerns for anxiety and motion again. LE weaker than UE. Now on  ATC Trach 40%. Afebrilre. Vent overnight. C collar remains. RN sas stage 2 decub slowly progressing to stage 3.   Family request change over from klonopin  to xanax , started on methadone   11/28 - Placed on dilaudid  gtt last evening for ongoing pain, Currently rates 5/10. MRI - spine nnil acute Increaesd respioratiory secretions and o2 needs up  =-> started Ceftriaxone  and Flagyl   (Ancef  needs to be restarted 12/2 and continued through 12/16) Cutlure growing ABUNDANT SERRATA  on 05/12/24 11/30 Sputum with serratia. Trach collar all day. Day 4 methadone . PRN dilaudid  decreased.  12/1 - urology signed off - plan for long term foley with outpt urology f/u- change catheter over wire every 30 days 12/4 PCCM asked to see as pt more difficult to wake up , transferred to ICU for low RR and hypercarbia 12/10 worsening mental status, CT head with non surviveable ICH with IV extension, EVD placed 12/11 progression of ICH with generalized edema  Objective: Vital signs in last 24 hours: Temp:  [97.2 F (36.2 C)-98 F (36.7 C)] 97.2 F (36.2 C) (12/11 0400) Pulse Rate:  [54-113] 60 (12/11 0738) Resp:  [0-27] 12 (12/11 0738) BP: (76-177)/(54-148) 127/99 (12/11 0700) SpO2:  [85 %-99 %] 99 % (12/11 0738) Arterial Line BP: (65-168)/(38-103) 132/77 (12/11 0700) FiO2 (%):  [28 %-50 %] 40 % (12/11 0738) Weight:  [83.8 kg] 83.8 kg (12/11 0245)  Intake/Output from previous day: 12/10 0701 - 12/11 0700 In: 1584.2 [I.V.:826.4; NG/GT:360; IV Piggyback:397.8] Out: 3280 [Urine:2225; Emesis/NG output:950; Drains:105] Intake/Output this shift: No intake/output data recorded.  PHYSICAL EXAM  HENT:     Head: Normocephalic.     Nose: Nose normal.  Eyes:      Pupils: Pupils are equal, round, and reactive to light.  Cardiovascular:     Rate and Rhythm: Normal rate.  Pulmonary:     Effort: Pulmonary effort is normal.  Abdominal:     General: Abdomen is flat.  Musculoskeletal:     Cervical back: Normal range of motion.  Neurological:     Mental Status: Unresponsive        Cranial Nerves: Unable to assess    Sensory: Unresponsive     Motor: Unresponsive    Coordination: Unresponsive  Skin:      EVD insertion site appears clean, dry, and intact with well-approximated sutures. No erythema or edema present.   Lab Results: Recent Labs    05/22/24 0432 05/22/24 1659 05/13/2024 0321  WBC 10.5  --  11.7*  HGB 9.2* 9.2* 8.9*  HCT 29.7* 27.0* 29.4*  PLT 247  --  335   BMET Recent Labs    05/22/24 0432 05/22/24 1659 06/01/2024 0321  NA 147* 147* 145  K 3.7 3.9 3.6  CL 104  --  104  CO2 33*  --  33*  GLUCOSE 113*  --  114*  BUN 113*  --  119*  CREATININE 2.78*  --  2.61*  CALCIUM  8.6*  --  8.2*    Studies/Results: CT HEAD POST STROKE FOLLOWUP/TIMED/STAT READ Addendum Date: 05/21/2024 ADDENDUM #1 ADDENDUM: Study discussed by telephone with Dr. Lindzen  at 5:35 AM on 05/30/2024. ---------------------------------------------------- Electronically signed by: Helayne Hurst MD 05/13/2024 05:40 AM EST RP Workstation: [id number]   Result Date: 05/20/2024 ORIGINAL REPORT EXAM: CT HEAD WITHOUT 06/04/2024 04:45:37 AM TECHNIQUE: CT of the head was performed without the administration of intravenous contrast. Automated exposure control, iterative reconstruction, and/or weight based adjustment of the mA/kV was utilized to reduce the radiation dose to as low as reasonably achievable. COMPARISON: Head CT 05/22/2024. CLINICAL HISTORY: 49 year old male with history of stathouris facteremia, acute intracranial hemorrhage, septic arthritis of C1-C2 last month, and acute neuro deficit with suspected stroke. FINDINGS: BRAIN AND VENTRICLES: Stable right superior  frontal approach external ventricular drain. The right side external ventricular drain passes through the right lateral ventricle with evidence of communication with that ventricle on coronal image 49. Acute ventriculomegaly and indistinct transependymal edema, with loss of basilar cisterns since 1800 hours yesterday. Severe hyperdense intracranial hemorrhage involving the left deep gray nuclei, ventricular system, and now severely tracking through the brainstem and into the posterior fossa. Hemorrhagic progression in the midbrain region best demonstrated on sagittal image 31, compared to sagittal image 35 yesterday. Progressed and now diffuse cerebral edema, loss of supratentorial sulci. Loss of basilar cisterns. Hemorrhagic enlargement of the 4th ventricle. ORBITS: No acute abnormality. SINUSES AND MASTOIDS: Left maxillary sinus opacification is partially visible. Other visible paranasal sinuses remain well aerated. Right greater than left mastoid effusions are stable. Tympanic cavities remain well aerated. SOFT TISSUES AND SKULL: Numerous scalp EEG leads are now in place. Right nasoenteric tube remains in place. Stable postoperative changes to the superior right scalp. Abnormal relationship of the anterior C1 ring and odontoid process redemonstrated on series 3 image 1. No acute skull fracture. IMPRESSION: 1. Severe intracranial hemorrhage with progression through the brainstem and into the posterior fossa since 1800 hours yesterday. New loss of basilar cisterns with Acute ventriculomegaly (despite EVD), transependymal edema, and generalized cerebral edema with loss of supratentorial sulci. 2. Stable right frontal external ventricular drain, communicating with the right lateral ventricle. 3. Text page to discuss Critical findings on this exam sent to Dr. Lindzen at [phone] hours. Electronically signed by: Helayne Hurst MD 05/27/2024 05:13 AM EST RP Workstation: [id number]   DG CHEST PORT 1 VIEW Result Date:  05/22/2024 EXAM: 1 VIEW XRAY OF THE CHEST 05/22/2024 03:56:29 PM COMPARISON: 05/21/2024. CLINICAL HISTORY: Encounter for central line placement. FINDINGS: LINES, TUBES AND DEVICES: Left central line placement with the tip at the cavoatrial junction. Right PICC line, tracheostomy tube, and nasogastric (NG) tube remain in place, unchanged. LUNGS AND PLEURA: Patchy airspace disease again noted bilaterally, left greater than right, with some increase in the left mid and lower lung since the prior study. No pleural effusion. No pneumothorax. HEART AND MEDIASTINUM: No acute abnormality of the cardiac and mediastinal silhouettes. BONES AND SOFT TISSUES: No acute osseous abnormality. IMPRESSION: 1. Patchy bilateral airspace disease, left greater than right, mildly increased in the left mid and lower lung compared to the prior study. 2. Left central venous catheter with the tip at the cavoatrial junction without pneumothorax. Electronically signed by: Franky Crease MD 05/22/2024 07:32 PM EST RP Workstation: [id number]   CT HEAD WO CONTRAST ( ) Result Date: 05/22/2024 EXAM: CT HEAD WITHOUT 05/22/2024 06:00:42 PM TECHNIQUE: CT of the head was performed without the administration of intravenous contrast. Automated exposure control, iterative reconstruction, and/or weight based adjustment of the mA/kV was utilized to reduce the radiation dose to as low as reasonably achievable. COMPARISON: Head CT earlier today. CLINICAL HISTORY:  Mental status change, unknown cause. FINDINGS: BRAIN AND VENTRICLES: There has been interval placement of a right frontal approach EVD which courses through the region of the frontal horn of the right lateral ventricle and terminates more inferiorly in the right frontal lobe. There has been interval enlargement of the acute parenchymal hemorrhage centered in the region of the left thalamus which now measures approximately 7.3 x 5.0 x 4.8 cm and extends further anteriorly and laterally into the  basal ganglia as well as inferiorly into the midbrain. There is worsening, moderate surrounding edema. Intraventricular hemorrhage is again seen throughout the ventricles, greatest in the left lateral ventricle with increased volume from the prior CT and with increased distention of the temporal horn of the left lateral ventricle. Increased rightward midline shift measures approximately 1.6 cm. There is mildly increased effacement of the cerebral sulci and basilar cisterns. No acute large territory cortically based infarct is identified. Small bilateral cerebellar infarcts are unchanged. A small amount of pneumocephalus is noted. No extra-axial fluid collection is identified. Calcified atherosclerosis at the skull base. ORBITS: No acute abnormality. SINUSES AND MASTOIDS: Complete opacification of the included portion of the left maxillary sinus. Mild scattered mucosal thickening in the other paranasal sinuses. Moderate right and small left mastoid effusions. SOFT TISSUES AND SKULL: Frontal scalp soft tissue swelling and gas adjacent to the EVD. No acute skull fracture. IMPRESSION: 1. Interval enlargement of an acute parenchymal hemorrhage involving the left thalamus, basal ganglia, and midbrain with increased intraventricular extension, edema, and mass effect including 1.6 cm of rightward midline shift. 2. Interval placement of a right frontal approach EVD. Increased distention of the left lateral ventricle by hemorrhage. Electronically signed by: Dasie Hamburg MD 05/22/2024 06:38 PM EST RP Workstation: [id number]   CT HEAD WO CONTRAST ( ) Result Date: 05/22/2024 EXAM: CT HEAD WITHOUT 05/22/2024 12:37:00 PM TECHNIQUE: CT of the head was performed without the administration of intravenous contrast. Automated exposure control, iterative reconstruction, and/or weight based adjustment of the mA/kV was utilized to reduce the radiation dose to as low as reasonably achievable. COMPARISON: 05/22/2024 CLINICAL HISTORY:  ICH FINDINGS: BRAIN AND VENTRICLES: Redemonstrated parenchymal hemorrhage centered in the left thalamus which measures 5.1 x 3.9 x 4.6 cm on the current study previously measuring 4.7 x 3.6 x 3.9 cm. There is slightly increased surrounding edema. Similar extension into the ventricular system. Hyperattenuating clot noted partially distending the anterior body and frontal horn of the left lateral ventricle, overall similar to prior. Additional hyperattenuating blood products within the atrium as well as the occipital and temporal horns of the left lateral ventricle, also similar to prior. Similar blood products within the right lateral ventricle and 3rd ventricle. Hyperattenuating blood filling and slightly distending the 4th ventricle which is increased from prior. There is 7 mm rightward midline shift, overall similar to prior. Similar chronic microvascular ischemic changes and parenchymal volume loss. Interval increased caliber of the lateral ventricles particularly on the right. No evidence of acute infarct. No extra-axial fluid collection. ORBITS: No acute abnormality. SINUSES AND MASTOIDS: Mucosal thickening in the partially visualized ethmoid sinuses and left maxillary sinus. Small right mastoid effusion. SOFT TISSUES AND SKULL: Partially visualized nasoenteric tube. No acute skull fracture. No acute soft tissue abnormality. IMPRESSION: 1. Interval enlargement of left thalamic hemorrhage measuring 5.1 x 3.9 x 4.6 cm (previously 4.7 x 3.6 x 3.9 cm) with slightly increased surrounding edema and similar intraventricular extension compared to prior study. 2. Hyperattenuating blood filling and slightly distending the fourth ventricle, increased from  prior study. 3. Interval increased caliber of the lateral ventricles, particularly on the right, which may reflect evolving hydrocephalus. 4. 7 mm rightward midline shift, overall similar to prior study. Electronically signed by: Donnice Mania MD 05/22/2024 12:57 PM EST  RP Workstation: [id number]   CT HEAD CODE STROKE WO CONTRAST Result Date: 05/22/2024 EXAM: CT HEAD WITHOUT CONTRAST 05/22/2024 10:58:00 AM TECHNIQUE: CT of the head was performed without the administration of intravenous contrast. Automated exposure control, iterative reconstruction, and/or weight based adjustment of the mA/kV was utilized to reduce the radiation dose to as low as reasonably achievable. COMPARISON: 04/15/2024 CLINICAL HISTORY: Neuro deficit, acute, stroke suspected FINDINGS: BRAIN AND VENTRICLES: Acute intraparenchymal hemorrhage centered in the left thalamus measuring 2.6 x 3.4 x 2.9 cm (high - MBIG 3). Associated left cerebral edema and mass effect with 5 mm of right-to-left midline shift (high - MBIG 3). There is diminished attenuation within the right cerebellar hemisphere, which appears to be secondary to beam hardening artifact, but an infarct cannot be excluded. Intraventricular extension into the left lateral ventricle, third ventricle, and fourth ventricle (high - MBIG 3). Developing hydrocephalus. No extra-axial collection. ORBITS: No acute abnormality. SINUSES: Opacified left maxillary sinus. Small right mastoid effusion. SOFT TISSUES AND SKULL: Nasogastric tube in place. No acute soft tissue abnormality. No skull fracture (low - MBIG 1). The above findings were communicated to Dr. Arora at 11:05 am 05/22/2024. IMPRESSION: 1. Acute intraparenchymal hemorrhage centered in the left thalamus measuring 2.6 x 3.4 x 2.9 cm, with associated left cerebral edema and mass effect causing 5 mm of right-to-left midline shift. 2. Intraventricular extension into the left lateral ventricle, third ventricle, and fourth ventricle, with developing hydrocephalus. 3. Diminished attenuation within the right cerebellar hemisphere, possibly secondary to beam hardening artifact, but an infarct cannot be excluded. 4. TBI risk stratification: High (MBIG 3). Electronically signed by: Evalene Coho MD  05/22/2024 11:09 AM EST RP Workstation: [id number] 06/01/2024, 8:19 AM

## 2024-06-13 NOTE — Progress Notes (Signed)
°  Echocardiogram 2D Echocardiogram has been performed.  [name] [name] [facility] 05/26/2024, 8:59 AM

## 2024-06-13 NOTE — Progress Notes (Signed)
 Apnea test complete and results given to MD

## 2024-06-13 NOTE — Progress Notes (Signed)
 05/25/2024 Family at bedside saying goodbye. Mechanical ventilation and pressors to be Dc'd when they are ready.  Rolan Sharps MD PCCM

## 2024-06-13 NOTE — Plan of Care (Signed)
 CT head obtained at 0445: 1. Interval enlargement of an acute parenchymal hemorrhage involving the left thalamus, basal ganglia, and midbrain with increased intraventricular extension, edema, and mass effect including 1.6 cm of rightward midline shift. 2. Interval placement of a right frontal approach EVD. Increased distention of the left lateral ventricle by hemorrhage.  A/R: 49 y.o. male being treated for infective endocarditis, septic pulmonary emboli, psoas abscess and whole spine osteomyelitis, had sudden onset of left gaze, aphasia and flaccid right paralysis.  CT head was done stat as a code stroke was activated.  CT head showed a left thalamic ICH with IVH and developing hydrocephalus with 5 mm left-to-right midline shift. Suspect underlying septic emboli causing hemorrhagic infarct or an ischemic infarct due to septic emboli with hemorrhagic transformation. Due to the developing hydrocephalus-neurosurgery was consulted stat and a right EVD was placed.   - Severe extension of the hemorrhages on repeat CT this AM, relative to the prior scan obtained 11 hours previously. The hemorrhage is likely not to be survivable. Discussed with Radiology - Given that the patient's pupils were fixed and dilated last night, there is no chance of a meaningful neurological recovery. Family will be here today to say final goodbyes to the patient per Neurosurgery. Discussed the above findings with Dr. Janjua.    Electronically signed: Dr. Tameyah Koch

## 2024-06-13 NOTE — Progress Notes (Signed)
 RT disconnected pt from vent for comfort care measures.

## 2024-06-13 NOTE — Death Summary Note (Signed)
 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 Intubated. Started on vanc mero. Txf request to [place] to [facility]  11/3 Extubated, developed respiratory distress requiring reintubation. Brief cardiac arrest during intubation.  11/3 Transthoracic echocardiogram > LVEF 40-45%, global hypokinesis, grade 1 diastolic dysfunction, normal RV function.  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  No mitral or aortic valve involvement 11/3 CT Chest, abdomen, pelvis >> numerous bilateral cavitary and solid nodules consistent with septic emboli, left lower lobe consolidation with a small pleural effusion, right basilar atelectasis.  Moderate free fluid in the pelvis.  Erosive changes at T5-T6, L2-L3, L4-L5 suggestive of discitis 11/3 CT Head >> no acute abnormality.  Opacified left maxillary sinus 11/10 Transferred out of ICU. 11/13 PCCM reconsulted for decreased mental status, tachycardia, tachypnea. Started diuretics 11/16 Poorly responsive, transferred back to ICU and intubated for the third time. Pressors started post intubation. Renal function worse. UOP poor.  Trach aspirate - Candida Trorpicals and Calcians 11/20 NO acute events overnight, awake and following commands.  Patient and spouse met with palliative care and collectively  decision was made to proceed with trach and PEG if needed Blood culture - NEG BAL - normal flora 11/21 no acute events overnight, hemoglobin remained stable 11/23 pain, discomfort, muscle spasms 11/24 CCS trach, cortrak, off precedex  11/25 PSV 5/5 for 8hrs 11/26 - In better place mentally, motivated to continue to get better.  Thankful for care being provided. C/o of ongoing neck and back pain. Asking not to tell him when on PSV so he doesn't get more anxious Did ATC briefly Went for MRI but too much motion artifcat despite precedex  and intermitten fent/versed  and propofol  .  11/27   needs MRI again but concerns for anxiety and motion again. LE weaker than UE. Now on  ATC Trach 40%. Afebrilre. Vent overnight. C collar remains. RN sas stage 2 decub slowly progressing to stage 3.   Family request change over from klonopin  to xanax , started on methadone   11/28 - Placed on dilaudid  gtt last evening for ongoing pain, Currently rates 5/10. MRI - spine nnil acute Increaesd respioratiory secretions and o2 needs up  =-> started Ceftriaxone  and Flagyl   (Ancef  needs to be restarted 12/2 and continued through 12/16) Cutlure growing ABUNDANT SERRATA  on 05/12/24 11/30 Sputum with serratia. Trach collar all day. Day 4 methadone . PRN dilaudid  decreased.  12/1 - urology signed off - plan for long term foley with outpt urology f/u- change catheter over wire every 30 days 12/4 PCCM asked to see as pt more difficult to wake up , transferred to ICU for low RR and hypercarbia 12/10: obtundation- large intracranial hemorrhage with hydrocephalus: emergent EVD 06-22-2024 further brain herniation from bleed and progressed to brain death   Pertinent Labs and Studies  Significant Diagnostic Studies   Microbiology Recent Results (from the  past 240 hours)  Expectorated Sputum Assessment w Gram Stain, Rflx to Resp Cult     Status: None   Collection Time: 05/18/24  9:29 PM   Specimen: Tracheal Aspirate; Sputum  Result  Value Ref Range Status   Specimen Description TRACHEAL ASPIRATE  Final   Special Requests NONE  Final   Sputum evaluation   Final    THIS SPECIMEN IS ACCEPTABLE FOR SPUTUM CULTURE Performed at [facility], [address], [place], [place] [address]    Report Status 05/19/2024 FINAL  Final  Culture, Respiratory w Gram Stain     Status: None   Collection Time: 05/18/24  9:29 PM   Specimen: Tracheal Aspirate  Result Value Ref Range Status   Specimen Description TRACHEAL ASPIRATE  Final   Special Requests NONE Reflexed from [id number]  Final   Gram Stain   Final    FEW WBC PRESENT, PREDOMINANTLY PMN FEW GRAM NEGATIVE RODS RARE GRAM POSITIVE COCCI Performed at [facility], [address] [place], [place] [address]    Culture   Final    MODERATE PSEUDOMONAS AERUGINOSA FEW SERRATIA MARCESCENS    Report Status 05/24/2024 FINAL  Final   Organism ID, Bacteria SERRATIA MARCESCENS  Final   Organism ID, Bacteria PSEUDOMONAS AERUGINOSA  Final      Susceptibility   Pseudomonas aeruginosa - MIC*    MEROPENEM  4 INTERMEDIATE Intermediate     CIPROFLOXACIN  <=0.06 SENSITIVE Sensitive     IMIPENEM 8 RESISTANT Resistant     PIP/TAZO Value in next row Sensitive      <=4 SENSITIVEThis is a modified FDA-approved test that has been validated and its performance characteristics determined by the reporting laboratory.  This laboratory is certified under the Clinical Laboratory Improvement Amendments CLIA as qualified to perform high complexity clinical laboratory testing.    CEFEPIME  Value in next row Sensitive      <=4 SENSITIVEThis is a modified FDA-approved test that has been validated and its performance characteristics determined by the reporting laboratory.  This laboratory is certified under the Clinical Laboratory Improvement Amendments CLIA as qualified to perform high complexity clinical laboratory testing.    CEFTAZIDIME/AVIBACTAM Value in next row Sensitive      <=4 SENSITIVEThis is a  modified FDA-approved test that has been validated and its performance characteristics determined by the reporting laboratory.  This laboratory is certified under the Clinical Laboratory Improvement Amendments CLIA as qualified to perform high complexity clinical laboratory testing.    CEFTOLOZANE/TAZOBACTAM Value in next row Sensitive      <=4 SENSITIVEThis is a modified FDA-approved test that has been validated and its performance characteristics determined by the reporting laboratory.  This laboratory is certified under the Clinical Laboratory Improvement Amendments CLIA as qualified to perform high complexity clinical laboratory testing.    TOBRAMYCIN Value in next row Sensitive      <=4 SENSITIVEThis is a modified FDA-approved test that has been validated and its performance characteristics determined by the reporting laboratory.  This laboratory is certified under the Clinical Laboratory Improvement Amendments CLIA as qualified to perform high complexity clinical laboratory testing.    CEFTAZIDIME Value in next row Sensitive      <=4 SENSITIVEThis is a modified FDA-approved test that has been validated and its performance characteristics determined by the reporting laboratory.  This laboratory is certified under the Clinical Laboratory Improvement Amendments CLIA as qualified to perform high complexity clinical laboratory testing.    * MODERATE PSEUDOMONAS AERUGINOSA   Serratia  marcescens - MIC*    CEFEPIME  Value in next row Resistant      <=4 SENSITIVEThis is a modified FDA-approved test that has been validated and its performance characteristics determined by the reporting laboratory.  This laboratory is certified under the Clinical Laboratory Improvement Amendments CLIA as qualified to perform high complexity clinical laboratory testing.    CEFTRIAXONE  Value in next row Resistant      <=4 SENSITIVEThis is a modified FDA-approved test that has been validated and its performance characteristics  determined by the reporting laboratory.  This laboratory is certified under the Clinical Laboratory Improvement Amendments CLIA as qualified to perform high complexity clinical laboratory testing.    CIPROFLOXACIN  Value in next row Sensitive      <=4 SENSITIVEThis is a modified FDA-approved test that has been validated and its performance characteristics determined by the reporting laboratory.  This laboratory is certified under the Clinical Laboratory Improvement Amendments CLIA as qualified to perform high complexity clinical laboratory testing.    GENTAMICIN Value in next row Sensitive      <=4 SENSITIVEThis is a modified FDA-approved test that has been validated and its performance characteristics determined by the reporting laboratory.  This laboratory is certified under the Clinical Laboratory Improvement Amendments CLIA as qualified to perform high complexity clinical laboratory testing.    MEROPENEM  Value in next row Sensitive      <=4 SENSITIVEThis is a modified FDA-approved test that has been validated and its performance characteristics determined by the reporting laboratory.  This laboratory is certified under the Clinical Laboratory Improvement Amendments CLIA as qualified to perform high complexity clinical laboratory testing.    TRIMETH /SULFA  Value in next row Sensitive      <=4 SENSITIVEThis is a modified FDA-approved test that has been validated and its performance characteristics determined by the reporting laboratory.  This laboratory is certified under the Clinical Laboratory Improvement Amendments CLIA as qualified to perform high complexity clinical laboratory testing.    * FEW SERRATIA MARCESCENS    Lab Basic Metabolic Panel: Recent Labs  Lab 05/22/24 0432 05/22/24 1659 05-29-24 0321 May 29, 2024 1321 May 29, 2024 1339  NA 147* 147* 145 148* 150*  K 3.7 3.9 3.6 3.5 3.6  CL 104  --  104  --   --   CO2 33*  --  33*  --   --   GLUCOSE 113*  --  114*  --   --   BUN 113*  --  119*   --   --   CREATININE 2.78*  --  2.61*  --   --   CALCIUM  8.6*  --  8.2*  --   --    Liver Function Tests: No results for input(s): AST, ALT, ALKPHOS, BILITOT, PROT, ALBUMIN  in the last 168 hours. No results for input(s): LIPASE, AMYLASE in the last 168 hours. No results for input(s): AMMONIA in the last 168 hours. CBC: Recent Labs  Lab 05/22/24 0432 05/22/24 1659 May 29, 2024 0321 May 29, 2024 1321 05-29-24 1339  WBC 10.5  --  11.7*  --   --   HGB 9.2* 9.2* 8.9* 9.5* 10.2*  HCT 29.7* 27.0* 29.4* 28.0* 30.0*  MCV 94.9  --  96.1  --   --   PLT 247  --  335  --   --    Cardiac Enzymes: No results for input(s): CKTOTAL, CKMB, CKMBINDEX, TROPONINI in the last 168 hours. Sepsis Labs: Recent Labs  Lab 05/22/24 0432 05/29/2024 0321  WBC 10.5 11.7*  Toribio JAYSON Sharps 05/28/2024, 12:00 PM

## 2024-06-13 NOTE — Plan of Care (Signed)
°  Problem: Education: Goal: Knowledge of General Education information will improve Description: Including pain rating scale, medication(s)/side effects and non-pharmacologic comfort measures Outcome: Completed/Met   Problem: Health Behavior/Discharge Planning: Goal: Ability to manage health-related needs will improve Outcome: Completed/Met   Problem: Clinical Measurements: Goal: Ability to maintain clinical measurements within normal limits will improve Outcome: Completed/Met Goal: Will remain free from infection Outcome: Completed/Met Goal: Diagnostic test results will improve Outcome: Completed/Met Goal: Respiratory complications will improve Outcome: Completed/Met Goal: Cardiovascular complication will be avoided Outcome: Completed/Met   Problem: Activity: Goal: Risk for activity intolerance will decrease Outcome: Completed/Met   Problem: Nutrition: Goal: Adequate nutrition will be maintained Outcome: Completed/Met   Problem: Coping: Goal: Level of anxiety will decrease Outcome: Completed/Met   Problem: Elimination: Goal: Will not experience complications related to bowel motility Outcome: Completed/Met Goal: Will not experience complications related to urinary retention Outcome: Completed/Met   Problem: Pain Managment: Goal: General experience of comfort will improve and/or be controlled Outcome: Completed/Met   Problem: Safety: Goal: Ability to remain free from injury will improve Outcome: Completed/Met   Problem: Skin Integrity: Goal: Risk for impaired skin integrity will decrease Outcome: Completed/Met   Problem: Education: Goal: Knowledge about tracheostomy care/management will improve Outcome: Completed/Met   Problem: Activity: Goal: Ability to tolerate increased activity will improve Outcome: Completed/Met   Problem: Health Behavior/Discharge Planning: Goal: Ability to manage tracheostomy will improve Outcome: Completed/Met   Problem:  Respiratory: Goal: Patent airway maintenance will improve Outcome: Completed/Met   Problem: Role Relationship: Goal: Ability to communicate will improve Outcome: Completed/Met   Problem: Activity: Goal: Ability to tolerate increased activity will improve Outcome: Completed/Met   Problem: Respiratory: Goal: Ability to maintain a clear airway and adequate ventilation will improve Outcome: Completed/Met   Problem: Role Relationship: Goal: Method of communication will improve Outcome: Completed/Met   Problem: Education: Goal: Knowledge of disease or condition will improve Outcome: Completed/Met Goal: Knowledge of secondary prevention will improve (MUST DOCUMENT ALL) Outcome: Completed/Met Goal: Knowledge of patient specific risk factors will improve (DELETE if not current risk factor) Outcome: Completed/Met   Problem: Intracerebral Hemorrhage Tissue Perfusion: Goal: Complications of Intracerebral Hemorrhage will be minimized Outcome: Completed/Met   Problem: Coping: Goal: Will verbalize positive feelings about self Outcome: Completed/Met Goal: Will identify appropriate support needs Outcome: Completed/Met   Problem: Health Behavior/Discharge Planning: Goal: Ability to manage health-related needs will improve Outcome: Completed/Met Goal: Goals will be collaboratively established with patient/family Outcome: Completed/Met   Problem: Self-Care: Goal: Ability to participate in self-care as condition permits will improve Outcome: Completed/Met Goal: Verbalization of feelings and concerns over difficulty with self-care will improve Outcome: Completed/Met Goal: Ability to communicate needs accurately will improve Outcome: Completed/Met   Problem: Nutrition: Goal: Risk of aspiration will decrease Outcome: Completed/Met Goal: Dietary intake will improve Outcome: Completed/Met

## 2024-06-13 NOTE — Progress Notes (Addendum)
 To STROKE TEAM PROGRESS NOTE   SUBJECTIVE (INTERVAL HISTORY)  Pt unable to provide history at this time due to massive ICH.  OBJECTIVE Temp:  [97.2 F (36.2 C)-98 F (36.7 C)] 97.5 F (36.4 C) (12/11 0800) Pulse Rate:  [54-113] 60 (12/11 1015) Cardiac Rhythm: Normal sinus rhythm (12/11 0800) Resp:  [0-26] 12 (12/11 1015) BP: (76-151)/(54-111) 122/97 (12/11 1015) SpO2:  [89 %-99 %] 95 % (12/11 1015) Arterial Line BP: (65-168)/(38-103) 134/77 (12/11 1015) FiO2 (%):  [40 %] 40 % (12/11 0738) Weight:  [83.8 kg] 83.8 kg (12/11 0245)  Recent Labs  Lab 05/21/24 1943 05/21/24 2321 05/22/24 0335 05/22/24 0749 05/22/24 1035  GLUCAP 108* 109* 110* 100* 106*   Recent Labs  Lab 05/17/24 0542 05/17/24 0653 05/17/24 1056 05/18/24 0528 05/19/24 0518 05/20/24 0448 05/21/24 0406 05/22/24 0432 05/22/24 1659 05/16/2024 0321  NA 139 140   < > 139 143 143 144 147* 147* 145  K 4.9 4.9   < > 4.7 4.1 3.5 3.3* 3.7 3.9 3.6  CL 97* 100  --  98 97* 97* 101 104  --  104  CO2 29 31  --  29 32 31 25 33*  --  33*  GLUCOSE 108* 115*  --  124* 106* 122* 100* 113*  --  114*  BUN 119* 119*  --  117* 112* 110* 108* 113*  --  119*  CREATININE 2.75* 2.95*  --  2.90* 2.82* 2.93* 2.92* 2.78*  --  2.61*  CALCIUM  8.0* 8.2*  --  8.4* 8.7* 8.4* 8.2* 8.6*  --  8.2*  MG 2.3 2.3  --  2.3 2.5*  --   --   --   --   --   PHOS 4.9* 4.9*  --  5.2* 4.9*  --   --   --   --   --    < > = values in this interval not displayed.   Recent Labs  Lab 05/17/24 0542 05/17/24 0653 05/18/24 0528 05/19/24 0518 05/20/24 0448  AST 14* 14* 16 17 15   ALT 5 <5 <5 <5 <5  ALKPHOS 145* 143* 132* 112 99  BILITOT 0.9 0.8 0.7 1.1 0.9  PROT 6.1* 6.1* 6.7 7.1 6.6  ALBUMIN  <1.5* <1.5* <1.5* 1.6* <1.5*   Recent Labs  Lab 05/17/24 0542 05/17/24 0653 05/17/24 1056 05/18/24 0528 05/18/24 1151 05/19/24 0518 05/19/24 1024 05/20/24 0448 05/21/24 0406 05/22/24 0432 05/22/24 1659 05/22/2024 0321  WBC 7.4 7.2   < > 8.2   < > 9.4  8.7 8.4 9.0 10.5  --  11.7*  NEUTROABS 5.9 5.8  --  6.8  --  7.7  --  6.6  --   --   --   --   HGB 5.9* 5.9*   < > 8.4*   < > 8.9* 8.6* 8.8* 9.2* 9.2* 9.2* 8.9*  HCT 19.6* 19.6*   < > 26.6*   < > 28.1* 27.2* 28.0* 29.6* 29.7* 27.0* 29.4*  MCV 97.5 98.5   < > 91.1   < > 92.1 91.6 93.6 94.0 94.9  --  96.1  PLT 159 146*   < > 166   < > 206 196 211 219 247  --  335   < > = values in this interval not displayed.   No results for input(s): CKTOTAL, CKMB, CKMBINDEX, TROPONINI in the last 168 hours. No results for input(s): LABPROT, INR in the last 72 hours. No results for input(s): COLORURINE, LABSPEC, PHURINE, GLUCOSEU, HGBUR,  BILIRUBINUR, KETONESUR, PROTEINUR, UROBILINOGEN, NITRITE, LEUKOCYTESUR in the last 72 hours.  Invalid input(s): APPERANCEUR     Component Value Date/Time   TRIG 101 05/05/2024 0846   Lab Results  Component Value Date   HGBA1C 4.6 (L) 04/25/2024      Component Value Date/Time   LABOPIA NONE DETECTED 04/15/2024 0009   COCAINSCRNUR POSITIVE (A) 04/15/2024 0009   LABBENZ POSITIVE (A) 04/15/2024 0009   AMPHETMU NONE DETECTED 04/15/2024 0009   THCU NONE DETECTED 04/15/2024 0009   LABBARB NONE DETECTED 04/15/2024 0009    No results for input(s): ETH in the last 168 hours.  I have personally reviewed the radiological images below and agree with the radiology interpretations.  ECHOCARDIOGRAM COMPLETE Result Date: 06/12/2024    ECHOCARDIOGRAM REPORT   Patient Name:   [name] [name] Date of Exam: 05/15/2024 Medical Rec #:  [id number]        Height:       72.0 in Accession #:    [id number]       Weight:       184.7 lb Date of Birth:  Mar 20, 1976        BSA:          2.060 m Patient Age:    48 years         BP:           127/99 mmHg Patient Gender: M                HR:           61 bpm. Exam Location:  Inpatient Procedure: 2D Echo (Both Spectral and Color Flow Doppler were utilized during            procedure). Indications:    Stroke   History:        Patient has prior history of Echocardiogram examinations.                 Stroke.  Sonographer:    Norleen Amour Referring Phys: ELIGIO LAV IMPRESSIONS  1. Abnormal septal motion. Left ventricular ejection fraction, by estimation, is 30 to 35%. The left ventricle has moderately decreased function. The left ventricle demonstrates global hypokinesis. Left ventricular diastolic parameters were normal.  2. Right ventricular systolic function is low normal. The right ventricular size is normal. There is mildly elevated pulmonary artery systolic pressure.  3. Right atrial size was moderately dilated.  4. The mitral valve is abnormal. Trivial mitral valve regurgitation. No evidence of mitral stenosis.  5. Large mobile calcified mass attached to the ventricula and atria side of the lateral TV leaflet and chords torrential TR with large coaptation gap no obvious PFO/ASD Masses described on prior echo 04/15/24 Seem more calcified at this time Known TV endocarditis with septic pulmopnary emboli Patient appears to be palliative care on chart review. The tricuspid valve is abnormal. Torrential tricuspid stenosis.  6. The aortic valve is tricuspid. Aortic valve regurgitation is not visualized. No aortic stenosis is present.  7. The inferior vena cava is dilated in size with <50% respiratory variability, suggesting right atrial pressure of 15 mmHg. FINDINGS  Left Ventricle: Abnormal septal motion. Left ventricular ejection fraction, by estimation, is 30 to 35%. The left ventricle has moderately decreased function. The left ventricle demonstrates global hypokinesis. Strain was performed and the global longitudinal strain is indeterminate. The left ventricular internal cavity size was normal in size. There is no left ventricular hypertrophy. Left ventricular diastolic parameters were normal. Right Ventricle: The right ventricular  size is normal. No increase in right ventricular wall thickness. Right ventricular  systolic function is low normal. There is mildly elevated pulmonary artery systolic pressure. The tricuspid regurgitant velocity is 2.70 m/s, and with an assumed right atrial pressure of 15 mmHg, the estimated right ventricular systolic pressure is 44.2 mmHg. Left Atrium: Left atrial size was normal in size. Right Atrium: Right atrial size was moderately dilated. Pericardium: Trivial pericardial effusion is present. The pericardial effusion is posterior to the left ventricle. Mitral Valve: The mitral valve is abnormal. There is mild thickening of the mitral valve leaflet(s). Trivial mitral valve regurgitation. No evidence of mitral valve stenosis. MV peak gradient, 1.9 mmHg. The mean mitral valve gradient is 1.0 mmHg. Tricuspid Valve: Large mobile calcified mass attached to the ventricula and atria side of the lateral TV leaflet and chords torrential TR with large coaptation gap no obvious PFO/ASD Masses described on prior echo 04/15/24 Seem more calcified at this time  Known TV endocarditis with septic pulmopnary emboli Patient appears to be palliative care on chart review. The tricuspid valve is abnormal. Tricuspid valve regurgitation is trivial. Torrential tricuspid stenosis. Aortic Valve: The aortic valve is tricuspid. Aortic valve regurgitation is not visualized. No aortic stenosis is present. Aortic valve mean gradient measures 2.0 mmHg. Aortic valve peak gradient measures 3.5 mmHg. Aortic valve area, by VTI measures 2.39 cm. Pulmonic Valve: The pulmonic valve was normal in structure. Pulmonic valve regurgitation is trivial. No evidence of pulmonic stenosis. Aorta: The aortic root is normal in size and structure. Venous: The inferior vena cava is dilated in size with less than 50% respiratory variability, suggesting right atrial pressure of 15 mmHg. IAS/Shunts: No atrial level shunt detected by color flow Doppler. Additional Comments: 3D was performed not requiring image post processing on an independent  workstation and was indeterminate.  LEFT VENTRICLE PLAX 2D LVIDd:         4.80 cm      Diastology LVIDs:         3.40 cm      LV e' medial:    7.51 cm/s LV PW:         1.10 cm      LV E/e' medial:  7.8 LV IVS:        1.00 cm      LV e' lateral:   9.57 cm/s LVOT diam:     2.20 cm      LV E/e' lateral: 6.1 LV SV:         42 LV SV Index:   20 LVOT Area:     3.80 cm  LV Volumes (MOD) LV vol d, MOD A2C: 103.0 ml LV vol d, MOD A4C: 90.5 ml LV vol s, MOD A2C: 37.1 ml LV vol s, MOD A4C: 62.3 ml LV SV MOD A2C:     65.9 ml LV SV MOD A4C:     90.5 ml LV SV MOD BP:      49.8 ml RIGHT VENTRICLE            IVC RV Basal diam:  4.60 cm    IVC diam: 2.60 cm RV S prime:     6.53 cm/s TAPSE (M-mode): 2.1 cm     PULMONARY VEINS                            Diastolic Velocity: 30.50 cm/s  S/D Velocity:       1.10                            Systolic Velocity:  34.90 cm/s LEFT ATRIUM           Index        RIGHT ATRIUM           Index LA diam:      3.00 cm 1.46 cm/m   RA Area:     18.65 cm LA Vol (A4C): 44.8 ml 21.75 ml/m  RA Volume:   58.30 ml  28.30 ml/m  AORTIC VALVE                    PULMONIC VALVE AV Area (Vmax):    2.12 cm     PV Vmax:       0.70 m/s AV Area (Vmean):   2.49 cm     PV Peak grad:  2.0 mmHg AV Area (VTI):     2.39 cm AV Vmax:           93.20 cm/s AV Vmean:          64.700 cm/s AV VTI:            0.175 m AV Peak Grad:      3.5 mmHg AV Mean Grad:      2.0 mmHg LVOT Vmax:         52.10 cm/s LVOT Vmean:        42.400 cm/s LVOT VTI:          0.110 m LVOT/AV VTI ratio: 0.63  AORTA Ao Root diam: 3.20 cm MITRAL VALVE               TRICUSPID VALVE MV Area (PHT): 3.15 cm    TR Peak grad:   29.2 mmHg MV Area VTI:   2.20 cm    TR Vmax:        270.00 cm/s MV Peak grad:  1.9 mmHg MV Mean grad:  1.0 mmHg    SHUNTS MV Vmax:       0.69 m/s    Systemic VTI:  0.11 m MV Vmean:      34.8 cm/s   Systemic Diam: 2.20 cm MV Decel Time: 241 msec MV E velocity: 58.80 cm/s MV A velocity: 58.50 cm/s MV E/A ratio:   1.01 Maude Emmer MD Electronically signed by Maude Emmer MD Signature Date/Time: 06/12/2024/10:53:44 AM    Final    Overnight EEG with video Result Date: 05/26/2024 Shelton Arlin KIDD, MD     06/07/2024  9:23 AM Patient Name: [name] [name] MRN: [id number] Epilepsy Attending: Arlin KIDD Shelton Referring Physician/Provider: Judithe Rocky BROCKS, NP Duration: 05/22/2024 1534 to 05/13/2024 0915 Patient history: 49 y.o. male being treated for infective endocarditis, septic pulmonary emboli, psoas abscess and whole spine osteomyelitis, had sudden onset of left gaze, aphasia and flaccid right paralysis. EEG to evaluate for seizure. Level of alertness:  comatose/ lethargic AEDs during EEG study: LEV, PHT, Propofol  Technical aspects: This EEG study was done with scalp electrodes positioned according to the 10-20 International system of electrode placement. Electrical activity was reviewed with band pass filter of 1-70Hz , sensitivity of 7 uV/mm, display speed of 26mm/sec with a 60Hz  notched filter applied as appropriate. EEG data were recorded continuously and digitally stored.  Video monitoring was available and reviewed as appropriate. Description: EEG showed near continuous generalized 5-9hz  theta-alpha activity  admixed with 1-2 seconds of generalized attenuation. After around 2300 on 05/22/2024, EEG worsened and showed generalized background suppression. Hyperventilation and photic stimulation were not performed.   ABNORMALITY - Continuous slow, generalized - Background suppression, generalized IMPRESSION: This study was initially suggestive of severe diffuse encephalopathy. After around 2300 on 05/22/2024, EEG worsened and was suggestive of profound diffuse encephalopathy. No seizures or epileptiform discharges were seen throughout the recording. Priyanka MALVA Krebs   CT HEAD POST STROKE FOLLOWUP/TIMED/STAT READ Addendum Date: 06/06/2024 ADDENDUM #1 ADDENDUM: Study discussed by telephone with Dr. Lindzen at 5:35 AM on  05/21/2024. ---------------------------------------------------- Electronically signed by: Helayne Hurst MD 05/27/2024 05:40 AM EST RP Workstation: [id number]   Result Date: 05/26/2024 ORIGINAL REPORT EXAM: CT HEAD WITHOUT 06/10/2024 04:45:37 AM TECHNIQUE: CT of the head was performed without the administration of intravenous contrast. Automated exposure control, iterative reconstruction, and/or weight based adjustment of the mA/kV was utilized to reduce the radiation dose to as low as reasonably achievable. COMPARISON: Head CT 05/22/2024. CLINICAL HISTORY: 49 year old male with history of stathouris facteremia, acute intracranial hemorrhage, septic arthritis of C1-C2 last month, and acute neuro deficit with suspected stroke. FINDINGS: BRAIN AND VENTRICLES: Stable right superior frontal approach external ventricular drain. The right side external ventricular drain passes through the right lateral ventricle with evidence of communication with that ventricle on coronal image 49. Acute ventriculomegaly and indistinct transependymal edema, with loss of basilar cisterns since 1800 hours yesterday. Severe hyperdense intracranial hemorrhage involving the left deep gray nuclei, ventricular system, and now severely tracking through the brainstem and into the posterior fossa. Hemorrhagic progression in the midbrain region best demonstrated on sagittal image 31, compared to sagittal image 35 yesterday. Progressed and now diffuse cerebral edema, loss of supratentorial sulci. Loss of basilar cisterns. Hemorrhagic enlargement of the 4th ventricle. ORBITS: No acute abnormality. SINUSES AND MASTOIDS: Left maxillary sinus opacification is partially visible. Other visible paranasal sinuses remain well aerated. Right greater than left mastoid effusions are stable. Tympanic cavities remain well aerated. SOFT TISSUES AND SKULL: Numerous scalp EEG leads are now in place. Right nasoenteric tube remains in place. Stable postoperative  changes to the superior right scalp. Abnormal relationship of the anterior C1 ring and odontoid process redemonstrated on series 3 image 1. No acute skull fracture. IMPRESSION: 1. Severe intracranial hemorrhage with progression through the brainstem and into the posterior fossa since 1800 hours yesterday. New loss of basilar cisterns with Acute ventriculomegaly (despite EVD), transependymal edema, and generalized cerebral edema with loss of supratentorial sulci. 2. Stable right frontal external ventricular drain, communicating with the right lateral ventricle. 3. Text page to discuss Critical findings on this exam sent to Dr. Lindzen at [phone] hours. Electronically signed by: Helayne Hurst MD 05/25/2024 05:13 AM EST RP Workstation: [id number]   DG CHEST PORT 1 VIEW Result Date: 05/22/2024 EXAM: 1 VIEW XRAY OF THE CHEST 05/22/2024 03:56:29 PM COMPARISON: 05/21/2024. CLINICAL HISTORY: Encounter for central line placement. FINDINGS: LINES, TUBES AND DEVICES: Left central line placement with the tip at the cavoatrial junction. Right PICC line, tracheostomy tube, and nasogastric (NG) tube remain in place, unchanged. LUNGS AND PLEURA: Patchy airspace disease again noted bilaterally, left greater than right, with some increase in the left mid and lower lung since the prior study. No pleural effusion. No pneumothorax. HEART AND MEDIASTINUM: No acute abnormality of the cardiac and mediastinal silhouettes. BONES AND SOFT TISSUES: No acute osseous abnormality. IMPRESSION: 1. Patchy bilateral airspace disease, left greater than right, mildly increased in the left mid and lower  lung compared to the prior study. 2. Left central venous catheter with the tip at the cavoatrial junction without pneumothorax. Electronically signed by: Franky Crease MD 05/22/2024 07:32 PM EST RP Workstation: [id number]   CT HEAD WO CONTRAST ( ) Result Date: 05/22/2024 EXAM: CT HEAD WITHOUT 05/22/2024 06:00:42 PM TECHNIQUE: CT of the head was  performed without the administration of intravenous contrast. Automated exposure control, iterative reconstruction, and/or weight based adjustment of the mA/kV was utilized to reduce the radiation dose to as low as reasonably achievable. COMPARISON: Head CT earlier today. CLINICAL HISTORY: Mental status change, unknown cause. FINDINGS: BRAIN AND VENTRICLES: There has been interval placement of a right frontal approach EVD which courses through the region of the frontal horn of the right lateral ventricle and terminates more inferiorly in the right frontal lobe. There has been interval enlargement of the acute parenchymal hemorrhage centered in the region of the left thalamus which now measures approximately 7.3 x 5.0 x 4.8 cm and extends further anteriorly and laterally into the basal ganglia as well as inferiorly into the midbrain. There is worsening, moderate surrounding edema. Intraventricular hemorrhage is again seen throughout the ventricles, greatest in the left lateral ventricle with increased volume from the prior CT and with increased distention of the temporal horn of the left lateral ventricle. Increased rightward midline shift measures approximately 1.6 cm. There is mildly increased effacement of the cerebral sulci and basilar cisterns. No acute large territory cortically based infarct is identified. Small bilateral cerebellar infarcts are unchanged. A small amount of pneumocephalus is noted. No extra-axial fluid collection is identified. Calcified atherosclerosis at the skull base. ORBITS: No acute abnormality. SINUSES AND MASTOIDS: Complete opacification of the included portion of the left maxillary sinus. Mild scattered mucosal thickening in the other paranasal sinuses. Moderate right and small left mastoid effusions. SOFT TISSUES AND SKULL: Frontal scalp soft tissue swelling and gas adjacent to the EVD. No acute skull fracture. IMPRESSION: 1. Interval enlargement of an acute parenchymal hemorrhage  involving the left thalamus, basal ganglia, and midbrain with increased intraventricular extension, edema, and mass effect including 1.6 cm of rightward midline shift. 2. Interval placement of a right frontal approach EVD. Increased distention of the left lateral ventricle by hemorrhage. Electronically signed by: Dasie Hamburg MD 05/22/2024 06:38 PM EST RP Workstation: [id number]   CT HEAD WO CONTRAST ( ) Result Date: 05/22/2024 EXAM: CT HEAD WITHOUT 05/22/2024 12:37:00 PM TECHNIQUE: CT of the head was performed without the administration of intravenous contrast. Automated exposure control, iterative reconstruction, and/or weight based adjustment of the mA/kV was utilized to reduce the radiation dose to as low as reasonably achievable. COMPARISON: 05/22/2024 CLINICAL HISTORY: ICH FINDINGS: BRAIN AND VENTRICLES: Redemonstrated parenchymal hemorrhage centered in the left thalamus which measures 5.1 x 3.9 x 4.6 cm on the current study previously measuring 4.7 x 3.6 x 3.9 cm. There is slightly increased surrounding edema. Similar extension into the ventricular system. Hyperattenuating clot noted partially distending the anterior body and frontal horn of the left lateral ventricle, overall similar to prior. Additional hyperattenuating blood products within the atrium as well as the occipital and temporal horns of the left lateral ventricle, also similar to prior. Similar blood products within the right lateral ventricle and 3rd ventricle. Hyperattenuating blood filling and slightly distending the 4th ventricle which is increased from prior. There is 7 mm rightward midline shift, overall similar to prior. Similar chronic microvascular ischemic changes and parenchymal volume loss. Interval increased caliber of the lateral ventricles particularly on the right. No  evidence of acute infarct. No extra-axial fluid collection. ORBITS: No acute abnormality. SINUSES AND MASTOIDS: Mucosal thickening in the partially visualized  ethmoid sinuses and left maxillary sinus. Small right mastoid effusion. SOFT TISSUES AND SKULL: Partially visualized nasoenteric tube. No acute skull fracture. No acute soft tissue abnormality. IMPRESSION: 1. Interval enlargement of left thalamic hemorrhage measuring 5.1 x 3.9 x 4.6 cm (previously 4.7 x 3.6 x 3.9 cm) with slightly increased surrounding edema and similar intraventricular extension compared to prior study. 2. Hyperattenuating blood filling and slightly distending the fourth ventricle, increased from prior study. 3. Interval increased caliber of the lateral ventricles, particularly on the right, which may reflect evolving hydrocephalus. 4. 7 mm rightward midline shift, overall similar to prior study. Electronically signed by: Donnice Mania MD 05/22/2024 12:57 PM EST RP Workstation: [id number]   CT HEAD CODE STROKE WO CONTRAST Result Date: 05/22/2024 EXAM: CT HEAD WITHOUT CONTRAST 05/22/2024 10:58:00 AM TECHNIQUE: CT of the head was performed without the administration of intravenous contrast. Automated exposure control, iterative reconstruction, and/or weight based adjustment of the mA/kV was utilized to reduce the radiation dose to as low as reasonably achievable. COMPARISON: 04/15/2024 CLINICAL HISTORY: Neuro deficit, acute, stroke suspected FINDINGS: BRAIN AND VENTRICLES: Acute intraparenchymal hemorrhage centered in the left thalamus measuring 2.6 x 3.4 x 2.9 cm (high - MBIG 3). Associated left cerebral edema and mass effect with 5 mm of right-to-left midline shift (high - MBIG 3). There is diminished attenuation within the right cerebellar hemisphere, which appears to be secondary to beam hardening artifact, but an infarct cannot be excluded. Intraventricular extension into the left lateral ventricle, third ventricle, and fourth ventricle (high - MBIG 3). Developing hydrocephalus. No extra-axial collection. ORBITS: No acute abnormality. SINUSES: Opacified left maxillary sinus. Small right  mastoid effusion. SOFT TISSUES AND SKULL: Nasogastric tube in place. No acute soft tissue abnormality. No skull fracture (low - MBIG 1). The above findings were communicated to Dr. Arora at 11:05 am 05/22/2024. IMPRESSION: 1. Acute intraparenchymal hemorrhage centered in the left thalamus measuring 2.6 x 3.4 x 2.9 cm, with associated left cerebral edema and mass effect causing 5 mm of right-to-left midline shift. 2. Intraventricular extension into the left lateral ventricle, third ventricle, and fourth ventricle, with developing hydrocephalus. 3. Diminished attenuation within the right cerebellar hemisphere, possibly secondary to beam hardening artifact, but an infarct cannot be excluded. 4. TBI risk stratification: High (MBIG 3). Electronically signed by: Evalene Coho MD 05/22/2024 11:09 AM EST RP Workstation: [id number]   DG Chest Port 1 View Result Date: 05/21/2024 CLINICAL DATA:  Respiratory failure. EXAM: PORTABLE CHEST 1 VIEW COMPARISON:  05/16/2024 FINDINGS: Tracheostomy tube again noted. The NG tube passes into the stomach although the distal tip position is not included on the film. Right PICC line tip overlies the proximal SVC level. Cardiopericardial silhouette is at upper limits of normal for size. Lungs are better expanded with some decrease in the patchy airspace disease seen previously. No substantial pleural effusion. Telemetry leads overlie the chest. IMPRESSION: Interval improvement in aeration of the lungs with some decrease in patchy airspace disease seen previously. Electronically Signed   By: Camellia Candle M.D.   On: 05/21/2024 06:47   DG Abd 1 View Result Date: 05/19/2024 CLINICAL DATA:  NG tube placement. EXAM: ABDOMEN - 1 VIEW COMPARISON:  None Available. FINDINGS: The bowel gas pattern is normal in the visualized mid to upper abdomen. An enteric tube terminates in the stomach and appears appropriate in position. No radio-opaque calculi or other acute  radiographic abnormality are  seen. IMPRESSION: NG tube is appropriate in position. Electronically Signed   By: Leita Birmingham M.D.   On: 05/19/2024 14:56   US  RENAL Result Date: 05/16/2024 EXAM: US  Retroperitoneum Complete, Renal. 05/16/2024 03:01:08 PM TECHNIQUE: Real-time ultrasonography of the retroperitoneum renal was performed. COMPARISON: None available CLINICAL HISTORY: AKI (acute kidney injury). FINDINGS: FINDINGS: RIGHT KIDNEY/URETER: Right kidney measures 12.9 x 5.9 x 7.7 cm. Right renal volume 305 ml. 1.5 cm simple cyst of the right kidney. Small volume pararenal fluid. Normal cortical echogenicity. No hydronephrosis. No calculus. LEFT KIDNEY/URETER: Left kidney measures 11.8 x 7.0 x 6.8 cm. Left renal volume 292 ml. Small volume pararenal fluid. Normal cortical echogenicity. No hydronephrosis. No calculus. No mass. BLADDER: Foley catheter in the urinary bladder. OTHER FINDINGS: Small volume right upper quadrant and pelvic ascites. IMPRESSION: 1. No acute sonographic abnormality to explain acute kidney injury. Electronically signed by: Franky Stanford MD 05/16/2024 08:10 PM EST RP Workstation: [id number]   DG Chest Port 1 View Result Date: 05/16/2024 EXAM: 1 VIEW(S) XRAY OF THE CHEST 05/16/2024 01:34:05 PM COMPARISON: 05/15/2024 CLINICAL HISTORY: Acute hypoxemic respiratory failure (HCC) FINDINGS: LINES, TUBES AND DEVICES: Tracheostomy tube stable in midline at clavicular level. Enteric tube in place with tip beyond inferior film margin. Stable right PICC line. LUNGS AND PLEURA: Persistent left greater than right lower lung airspace opacities. Mildly improved aeration at right lung base. Stable small right pleural effusion. No pneumothorax. HEART AND MEDIASTINUM: No acute abnormality of the cardiac and mediastinal silhouettes. BONES AND SOFT TISSUES: No acute osseous abnormality. IMPRESSION: 1. Persistent left greater than right lower lung airspace opacities. 2. Stable small right pleural effusion. 3. Mildly improved aeration at  right lung base. Electronically signed by: Franky Stanford MD 05/16/2024 08:07 PM EST RP Workstation: [id number]   DG Chest Port 1 View Result Date: 05/15/2024 EXAM: 1 VIEW(S) XRAY OF THE CHEST 05/15/2024 07:13:00 AM COMPARISON: 05/12/2024 CLINICAL HISTORY: SOB (shortness of breath) FINDINGS: LINES, TUBES AND DEVICES: Tracheostomy tube in place. Enteric tube courses below diaphragm with distal tip beyond inferior margin of film. Right upper extremity PICC with tip at superior cavoatrial junction. LUNGS AND PLEURA: Increased patchy airspace opacity in left mid to lower lung zone. Increased right basilar airspace opacity. Trace right pleural effusion. No pneumothorax. HEART AND MEDIASTINUM: No acute abnormality of the cardiac and mediastinal silhouettes. BONES AND SOFT TISSUES: No acute osseous abnormality. IMPRESSION: 1. Increased patchy airspace opacity in the left mid to lower lung zone and increased right basilar airspace opacity, compatible with multifocal airspace disease. 2. Trace right pleural effusion. Electronically signed by: Birmingham Calk MD 05/15/2024 07:20 AM EST RP Workstation: [id number]   US  EKG SITE RITE Result Date: 05/12/2024 If Site Rite image not attached, placement could not be confirmed due to current cardiac rhythm.  DG CHEST PORT 1 VIEW Result Date: 05/12/2024 EXAM: 1 VIEW(S) XRAY OF THE CHEST 05/12/2024 05:51:00 AM COMPARISON: 05/06/2024 CLINICAL HISTORY: Acute and chronic respiratory failure (acute-on-chronic) (HCC) FINDINGS: LINES, TUBES AND DEVICES: Tracheostomy tube in place with tip 5.5 cm above the carina. Enteric tube in place, coursing below the hemidiaphragm with tip collimated off view. LUNGS AND PLEURA: Persistent bilateral patchy airspace opacities. Small left pleural effusion. No pneumothorax. HEART AND MEDIASTINUM: No acute abnormality of the cardiac and mediastinal silhouettes. BONES AND SOFT TISSUES: No acute osseous abnormality. IMPRESSION: 1. Persistent bilateral  patchy airspace opacities. 2. Small left pleural effusion. Electronically signed by: Franky Stanford MD 05/12/2024 11:35 AM EST RP Workstation: [id number]  MR LUMBAR SPINE WO CONTRAST Result Date: 05/09/2024 EXAM: MRI LUMBAR SPINE 05/09/2024 02:55:14 PM TECHNIQUE: Multiplanar multisequence MRI of the lumbar spine was performed without the administration of intravenous contrast. COMPARISON: Thoracic MRI 05/09/2024 reported separately. CT chest, abdomen, and pelvis 05/02/2024. Lumbar MRI 04/23/2024. CLINICAL HISTORY: 49 year old male with Staph aureus bacteremia, concerning for epidural abscess. 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 Mostly lumbarized S1 level. This is the same numbering system used on the previous MRI. Correlation with radiographs is recommended prior to any operative intervention. BONES AND ALIGNMENT: Stable vertebral height and alignment. Partially visible sacrum and SI joints appear to remain within normal limits. T12 anterior inferior endplate faint abnormal marrow signal appears new (series 5 image 10). No L1 marrow edema. No other lumbar marrow edema identified. Mildly regressed but not resolved abnormal signal at the L5-S1 disc space and endplates. Residual endplate marrow edema there is asymmetric to the right as before (series 6 image 6). The right facet remains abnormal, infected. Superimposed L3-L4 discitis osteomyelitis with moderate endplate erosion. Marrow edema at that level also mildly regressed. SPINAL CORD: Normal conus medullaris at L1-L2. SOFT TISSUES: Extensive abnormality lumbar paraspinal soft tissues with widespread myositis and muscle edema. Paraspinal phlegmon is maximal at L3-L4 and L5-S1. Medial left lower psoas muscle abscess tracks inferiorly from the L3-L4 disc space (series 8 images 27 and 32) continues into the sacral level (series 8 image 42) and measures up to 12 x 10 x 64 mm (AP by transverse by CC).  This has regressed. Estimated volume: 4 mL. Contralateral septic right facet joint associated posterior paraspinal muscle abscess is 12 x 19 x 18 mm (AP by transverse by CC). This is minimally smaller. Estimated volume: 2 mL. No new paraspinal abscess identified. Partially visible retroperitoneal space edema in the abdomen and pelvis including surrounding the kidneys. Grossly stable kidneys otherwise. L3-L4: Superimposed L3-L4 discitis osteomyelitis with moderate endplate erosion. Marrow edema at that level also mildly regressed. Mild to moderate multifactorial lumbar spinal stenosis related to pathologic disc osteophyte complex with a broad based posterior component (series 8 image 25). Moderate to severe bilateral L3 neural foraminal stenosis. These findings are stable. No other lumbar spinal stenosis. L5-S1: Mildly regressed but not resolved abnormal signal at the L5-S1 disc space and endplates. Residual endplate marrow edema there is asymmetric to the right as before (series 6 image 6). The right facet remains abnormal, infected. Moderate to severe right L5 neural foraminal stenosis related to pathologic facet and rightward disc osteophyte complex also stable. IMPRESSION: 1. Transitional lumbosacral anatomy with mostly lumbarized S1 level, same numbering used on prior Lumbar MRIs 2. Unresolved discitis/osteomyelitis at L3-L4, on the right at L5-S1, and ongoing associated right side L5-S1 septic facet joint. 3. Mild new abnormal signal at the anterior T12 inferior endplate. Difficult to exclude early New osteomyelitis at that level. Recommend attention on follow-up. 4. 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 Moderate to severe bilateral L3 and right L5 neural foraminal stenosis. 5. Extensive lumbar paraspinal edema and myositis. Mildly regressed left lower psoas muscle abscess (estimated 4 mL now) and minimally smaller right L5-S1 posterior facet joint abscess (estimated 2 mL).  Electronically signed by: Helayne Hurst MD 05/09/2024 04:29 PM EST RP Workstation: [id number]   MR THORACIC SPINE WO CONTRAST Result Date: 05/09/2024 EXAM: MRI THORACIC SPINE WITHOUT INTRAVENOUS CONTRAST 05/09/2024 02:55:14 PM TECHNIQUE: Multiplanar multisequence MRI of the thoracic spine was performed without the administration of intravenous contrast.  COMPARISON: Cervical spine MRI 05/08/2024. CT chest, abdomen, and pelvis 05/02/2024. Thoracic spine MRI 04/23/2024. CLINICAL HISTORY: 49 year old male with Staph aureus bacteremia. Concerning for epidural abscess. 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 FINDINGS: BONES AND ALIGNMENT: Abnormal widening and soft tissue at the C1-C2 articulation redemonstrated. Normal thoracic segmentation on the comparison CT. Ongoing, mildly progressed STIR hyperintense marrow edema at the T5 and T6 vertebral bodies surrounding the abnormal T5-T6 disc space. Endplate erosions at T5-T6, better demonstrated on the recent CT. No significant loss of vertebral body height at T5-T6 compared to the earlier MRI. No posterior marrow edema at T5-T6. Lower thoracic endplate osteophytosis redemonstrated. Stable and benign appearing T9 inferior endplate small focus of STIR hyperintensity. No new levels of marrow edema or infection identified. SPINAL CORD: No convincing abnormal thoracic spinal cord signal. Conus medullaris occurs below T12. SOFT TISSUES: Partially visible abnormal lungs, in part related to septic emboli. Small but lobulated bilateral pleural effusions. Partially visible retained secretions in the trachea. Negative visible upper abdominal viscera. No significant thoracic paraspinal phlegmon. No paraspinal soft tissue abscess identified. And no significant abnormality of the thoracic spine epidural space. DEGENERATIVE CHANGES: Capacious thoracic spinal canal and no spinal stenosis despite mild disc bulging and irregular endplates at some levels including T5-T6, T6-T7, T8-T9.  IMPRESSION: 1. Unresolved T5T6 discitis-osteomyelitis. But despite endplate erosion, no significant loss of vertebral body height, spinal abscess, or other complicating features at this time. 2. No new levels of thoracic spinal infection on this non-contrast exam. 3. Abnormal lungs, in part related to septic emboli. Small layering and loculated pleural effusions. Electronically signed by: Helayne Hurst MD 05/09/2024 04:15 PM EST RP Workstation: [id number]   MR CERVICAL SPINE WO CONTRAST Result Date: 05/08/2024 CLINICAL DATA:  Follow-up examination for known bacteremia with spinal infection, evaluate for possible epidural abscess. EXAM: MRI CERVICAL SPINE WITHOUT CONTRAST TECHNIQUE: Multiplanar, multisequence MR imaging of the cervical spine was performed. No intravenous contrast was administered. COMPARISON:  Comparison made with prior MRI from 04/23/2024. FINDINGS: Alignment: Examination severely degraded by motion artifact, markedly limiting assessment. Straightening of the normal cervical lordosis. No interval listhesis. Vertebrae: Persistent findings of on going infection at the skull base involving the C1-2 articulations again seen. Persistent abnormal widening of the landed dental interval up to approximately 10 mm. Basion dens interval measures up to 16 mm. Secondary slight basilar invagination at the ventral aspect of the cervicomedullary junction. Overall, appearance and alignment is relatively stable from prior. Associated prevertebral edema within the upper cervical spinal cord. No other visible new or distant areas of progressive infection elsewhere within the cervical spine on this motion degraded exam. Vertebral body height maintained. Decreased T1 signal intensity noted throughout the visualized bone marrow, nonspecific, but most commonly related to anemia, smoking or obesity. Cord: Grossly normal signal and morphology. No visible epidural abscess or other epidural involvement of infection on this  motion degraded exam. Posterior Fossa, vertebral arteries, paraspinal tissues: Visualized brain and posterior fossa are grossly within normal limits. No skull base infection about the C1-2 articulations as above. Associated prevertebral edema about the upper cervical spine. Neither vertebral artery flow void well seen on this motion degraded exam. Disc levels: Underlying mild for age cervical spondylosis, grossly similar to previous. No significant stenosis. No new or progressive finding on this motion degraded exam. IMPRESSION: 1. Severely motion degraded exam, markedly limiting assessment. 2. Persistent findings of ongoing infection at the skull base involving the atlantodental articulation. Persistent abnormal widening of the atlantodental interval up to 10 mm.  Basion dens interval measures up to 16 mm. Secondary slight basilar invagination at the ventral aspect of the cervicomedullary junction. Overall, appearance and alignment is relatively stable from prior. 3. No visible epidural abscess or other epidural involvement of infection on this motion degraded exam. No other visible new or distant sites of infection. Electronically Signed   By: Morene Hoard M.D.   On: 05/08/2024 18:46   DG Abd Portable 1V Result Date: 05/06/2024 CLINICAL DATA:  Enteric catheter placement EXAM: PORTABLE ABDOMEN - 1 VIEW COMPARISON:  04/29/2024 FINDINGS: Frontal view of the lower chest and upper abdomen demonstrates enteric catheter passing below diaphragm, tip projecting over the gastric fundus. Distended gas-filled loops of small bowel in the left upper quadrant measuring up to 3.8 cm in diameter, consistent with small-bowel obstruction. Patchy bibasilar airspace disease unchanged. IMPRESSION: 1. Enteric catheter tip projecting over the gastric fundus. 2. Distended gas-filled loops of small bowel concerning for obstruction. 3. Patchy bibasilar airspace disease. Electronically Signed   By: Ozell Daring M.D.   On:  05/06/2024 21:23   DG Chest Port 1 View Result Date: 05/06/2024 EXAM: 1 VIEW(S) XRAY OF THE CHEST 05/06/2024 12:54:00 PM COMPARISON: 05/01/2024 and CT of 05/02/2024. CLINICAL HISTORY: Status post tracheostomy (HCC) FINDINGS: LINES, TUBES AND DEVICES: Removal of endotracheal and nasogastric tubes. Placement of a tracheostomy. Removal of PICC line. LUNGS AND PLEURA: Breathing mask projects over the lung apices. Given this mild limitation, no pneumothorax. Minimally improved left base consolidation. Otherwise, similar appearance of left greater than right multifocal airspace opacities and subtle nodularity. No pleural effusion. HEART AND MEDIASTINUM: No acute abnormality of the cardiac and mediastinal silhouettes. BONES AND SOFT TISSUES: No acute osseous abnormality. IMPRESSION: 1. Minimally improved left base consolidation; otherwise, similar appearance of left greater than right multifocal pneumonia. 2. Removal of endotracheal tube with placement of tracheostomy. Electronically signed by: Rockey Kilts MD 05/06/2024 01:34 PM EST RP Workstation: [id number]   CT CHEST ABDOMEN PELVIS WO CONTRAST Result Date: 05/02/2024 CLINICAL DATA:  Bacteremia and discitis. EXAM: CT CHEST, ABDOMEN AND PELVIS WITHOUT CONTRAST TECHNIQUE: Multidetector CT imaging of the chest, abdomen and pelvis was performed following the standard protocol without IV contrast. RADIATION DOSE REDUCTION: This exam was performed according to the departmental dose-optimization program which includes automated exposure control, adjustment of the mA and/or kV according to patient size and/or use of iterative reconstruction technique. COMPARISON:  CT dated 04/15/2024. FINDINGS: Evaluation of this exam is limited in the absence of intravenous contrast and anasarca as well as due to streak artifact caused by patient's arms. CT CHEST FINDINGS Cardiovascular: There is no cardiomegaly or pericardial effusion. There is hypoattenuation of the cardiac blood pool  suggestive of anemia. Clinical correlation is recommended. The thoracic aorta and central pulmonary arteries are grossly unremarkable. Mediastinum/Nodes: Evaluation of the hilar lymph nodes is very limited in the absence of intravenous contrast and due to consolidative changes of the lungs. An enteric tube noted in the esophagus. No mediastinal fluid collection. Right-sided PICC with tip over central SVC. Lungs/Pleura: There is mucous impaction of the bronchus intermedius. A large area of consolidation with volume loss involving the right middle and right lower lobe, new since the prior CT. Subsegmental consolidation of the left lower lobe. Additional bilateral confluent airspace consolidation and several scattered cavitary lesions noted. Overall significant progression of consolidative changes compared to prior CT. Small bilateral pleural effusions with no pneumothorax. Endotracheal tube approximately 5.5 cm above the carina. Musculoskeletal: There is diffuse subcutaneous edema and anasarca. Erosive changes  at T5-T6 with endplate irregularity as seen previously and may represent osteomyelitis/discitis. CT ABDOMEN PELVIS FINDINGS No intra-abdominal free air.  Small ascites. Hepatobiliary: The liver is unremarkable. No biliary dilatation. The gallbladder is unremarkable. Pancreas: The pancreas is suboptimally evaluated but grossly unremarkable. Spleen: Normal in size without focal abnormality. Adrenals/Urinary Tract: The adrenal glands unremarkable. There is no hydronephrosis or nephrolithiasis on either side. Small right renal cyst. The urinary bladder is decompressed around a Foley catheter. Stomach/Bowel: Enteric tube with tip in the distal stomach. There is no bowel obstruction. The appendix is unremarkable. Vascular/Lymphatic: Mild aortoiliac atherosclerotic disease. The IVC is unremarkable. No portal venous gas. No obvious adenopathy. Reproductive: The prostate gland is grossly unremarkable. Other: Diffuse  subcutaneous edema and anasarca. Musculoskeletal: There is erosive changes and endplate irregularity at L2-L3 and L4-L5 concerning for osteomyelitis/discitis. IMPRESSION: 1. Significant progression of consolidative changes of the lungs with several scattered cavitary lesions. 2. Mucous impaction of the bronchus intermedius with complete collapse of the right middle and right lower lobes. 3. Small bilateral pleural effusions. 4. Erosive changes at T5-T6, L2-L3, and L4-L5 concerning for osteomyelitis/discitis. 5. Small ascites and diffuse subcutaneous edema and anasarca, progressed since the prior CT. 6.  Aortic Atherosclerosis (ICD10-I70.0). Electronically Signed   By: Vanetta Chou M.D.   On: 05/02/2024 20:28   DG Chest Port 1 View Result Date: 05/01/2024 EXAM: 1 VIEW(S) XRAY OF THE CHEST 05/01/2024 06:03:16 AM COMPARISON: Yesterday. CLINICAL HISTORY: History of ETT [id number]. FINDINGS: LINES, TUBES AND DEVICES: Stable support apparatus is in appropriate position. LUNGS AND PLEURA: Bilateral lung opacities, left greater than right. No pleural effusion. No pneumothorax. HEART AND MEDIASTINUM: No acute abnormality of the cardiac and mediastinal silhouettes. BONES AND SOFT TISSUES: No acute osseous abnormality. IMPRESSION: 1. Stable bilateral lung opacities, left greater than right. Electronically signed by: Lynwood Seip MD 05/01/2024 07:21 AM EST RP Workstation: [id number]   VAS US  LOWER EXTREMITY VENOUS (DVT) Result Date: 04/30/2024  Lower Venous DVT Study Patient Name:  [name] [name] [facility]  Date of Exam:   04/30/2024 Medical Rec #: [id number]         Accession #:    [id number] Date of Birth: 1976/01/29         Patient Gender: M Patient Age:   72 years Exam Location:  [facility] Procedure:      VAS US  LOWER EXTREMITY VENOUS (DVT) Referring Phys: TORIBIO SHARPS --------------------------------------------------------------------------------  Indications: Edema.  Comparison Study: Previous exam on 04/25/2024  was negative for DVT Performing Technologist: Ezzie Potters RVT, RDMS  Examination Guidelines: A complete evaluation includes B-mode imaging, spectral Doppler, color Doppler, and power Doppler as needed of all accessible portions of each vessel. Bilateral testing is considered an integral part of a complete examination. Limited examinations for reoccurring indications may be performed as noted. The reflux portion of the exam is performed with the patient in reverse Trendelenburg.  +---------+---------------+---------+-----------+----------+--------------+ RIGHT    CompressibilityPhasicitySpontaneityPropertiesThrombus Aging +---------+---------------+---------+-----------+----------+--------------+ CFV      Full           No       Yes                                 +---------+---------------+---------+-----------+----------+--------------+ SFJ      Full                                                        +---------+---------------+---------+-----------+----------+--------------+  FV Prox  Full           No       Yes                                 +---------+---------------+---------+-----------+----------+--------------+ FV Mid   Full           No       Yes                                 +---------+---------------+---------+-----------+----------+--------------+ FV DistalFull           No       Yes                                 +---------+---------------+---------+-----------+----------+--------------+ PFV      Full                                                        +---------+---------------+---------+-----------+----------+--------------+ POP      Full           No       Yes                                 +---------+---------------+---------+-----------+----------+--------------+ PTV      Full                                                        +---------+---------------+---------+-----------+----------+--------------+ PERO     Full                                                         +---------+---------------+---------+-----------+----------+--------------+   +---------+---------------+---------+-----------+----------+--------------+ LEFT     CompressibilityPhasicitySpontaneityPropertiesThrombus Aging +---------+---------------+---------+-----------+----------+--------------+ CFV      Full           No       Yes                                 +---------+---------------+---------+-----------+----------+--------------+ SFJ      Full                                                        +---------+---------------+---------+-----------+----------+--------------+ FV Prox  Full           No       Yes                                 +---------+---------------+---------+-----------+----------+--------------+ FV Mid  Full           No       Yes                                 +---------+---------------+---------+-----------+----------+--------------+ FV DistalFull           No       Yes                                 +---------+---------------+---------+-----------+----------+--------------+ PFV      Full                                                        +---------+---------------+---------+-----------+----------+--------------+ POP      Full           No       Yes                                 +---------+---------------+---------+-----------+----------+--------------+ PTV      Full                                                        +---------+---------------+---------+-----------+----------+--------------+ PERO     Full                                                        +---------+---------------+---------+-----------+----------+--------------+     Summary: BILATERAL: - No evidence of deep vein thrombosis seen in the lower extremities, bilaterally. -No evidence of popliteal cyst, bilaterally. - Diffuse subcutaneous edema, bilaterally.  *See table(s) above for  measurements and observations. Electronically signed by Debby Robertson on 04/30/2024 at 3:31:41 PM.    Final    DG Chest Port 1 View Result Date: 04/30/2024 EXAM: 1 VIEW(S) XRAY OF THE CHEST 04/30/2024 09:39:11 AM COMPARISON: 04/28/2024 CLINICAL HISTORY: History of ETT [id number] FINDINGS: LINES, TUBES AND DEVICES: Right PICC in place with tip in expected region of distal superior vena cava. Endotracheal tube in place with tip 4.4 cm above carina. Enteric tube in place with tip and side port below hemidiaphragm. LUNGS AND PLEURA: Bilateral ill defined nodular densities, left greater than right. progressive left lower lobe airspace opacity. Trace left pleural effusion. No pneumothorax. HEART AND MEDIASTINUM: No acute abnormality of the cardiac and mediastinal silhouettes. BONES AND SOFT TISSUES: No acute osseous abnormality. IMPRESSION: 1. Progressive left lower lobe airspace opacity with trace left pleural effusion. 2. Bilateral ill-defined nodular pulmonary densities, left greater than right. Electronically signed by: Katheleen Faes MD 04/30/2024 10:04 AM EST RP Workstation: [id number]   DG Abd Portable 1V Result Date: 04/29/2024 CLINICAL DATA:  Feeding tube placement. EXAM: PORTABLE ABDOMEN - 1 VIEW COMPARISON:  Radiograph yesterday FINDINGS: Tip and side port of the enteric tube below the diaphragm in the stomach. Nonobstructive included bowel gas pattern. IMPRESSION: Tip  and side port of the enteric tube below the diaphragm in the stomach. Electronically Signed   By: Andrea Gasman M.D.   On: 04/29/2024 13:09   DG Abd Portable 1V Result Date: 04/28/2024 EXAM: 1 VIEW XRAY OF THE ABDOMEN 04/28/2024 03:15:00 PM COMPARISON: Prior study 04/28/2024, 10:44 am. CLINICAL HISTORY: [id number] Gastrointestinal tube present [facility]) [id number] Gastrointestinal tube present (HCC). FINDINGS: LINES, TUBES AND DEVICES: Endotracheal tube tip at the thoracic inlet. Right subclavian CVC tip mid SVC. NG tube tip below the diaphragm  superimposed with the stomach. BOWEL: Nonobstructive bowel gas pattern. SOFT TISSUES: No opaque urinary calculi. BONES: No acute osseous abnormality. LUNGS AND PLEURAL SPACES: Diffuse pulmonary interstitial prominence. Bibasilar volume loss or consolidation. Small bilateral pleural effusions. IMPRESSION: 1. Diffuse pulmonary interstitial prominence, bibasilar volume loss or consolidation, and small bilateral pleural effusions. 2. Tubes and lines as described. Electronically signed by: Fonda Field MD 04/28/2024 05:53 PM EST RP Workstation: [id number] [id number] 1 View Result Date: 04/28/2024 CLINICAL DATA:  NGT placement. EXAM: ABDOMEN - 1 VIEW COMPARISON:  04/28/2024. FINDINGS: The bowel gas pattern is normal. An enteric tube terminates in the stomach. There small bilateral pleural effusions with atelectasis or infiltrate at the lung bases. IMPRESSION: 1. Enteric tube terminates in the stomach. 2. Small bilateral pleural effusions with atelectasis or infiltrate. Electronically Signed   By: Leita Birmingham M.D.   On: 04/28/2024 15:44   DG Abd 1 View Result Date: 04/28/2024 EXAM: 1 VIEW XRAY OF THE ABDOMEN 04/28/2024 12:07:00 PM COMPARISON: None available. CLINICAL HISTORY: Encounter for imaging study to confirm orogastric (OG) tube placement. FINDINGS: LINES, TUBES AND DEVICES: NG tube superimposed with stomach below the diaphragm left upper quadrant. BOWEL: Nonobstructive bowel gas pattern. SOFT TISSUES: No opaque urinary calculi. BONES: No acute osseous abnormality. IMPRESSION: 1. NG tube tip projects over the stomach in the left upper quadrant, below the diaphragm. Electronically signed by: Fonda Field MD 04/28/2024 12:11 PM EST RP Workstation: [id number]   DG CHEST PORT 1 VIEW Result Date: 04/28/2024 CLINICAL DATA:  Endotracheal tube evaluation and central line placement. EXAM: PORTABLE CHEST 1 VIEW COMPARISON:  04/28/2024 at 8:40 a.m. FINDINGS: Endotracheal tube with tip 6.1 cm above the carina.  Nasogastric tube courses into the region of the stomach and off the image as tip is not visualized. Nasogastric tube side port projects over the gastroesophageal junction. Right-sided PICC line has tip over the SVC. Lungs are adequately inflated demonstrate persistent bilateral patchy airspace process without significant change. Suggestion of small amount of bilateral pleural fluid slightly worse. Cardiomediastinal silhouette and remainder of the exam is unchanged. IMPRESSION: 1. Persistent bilateral patchy airspace process without significant change. Suggestion of small amount of bilateral pleural fluid slightly worse. 2. Tubes and lines as described. Nasogastric tube side port projects over the gastroesophageal junction. Electronically Signed   By: Toribio Agreste M.D.   On: 04/28/2024 11:20   DG Abd 1 View Result Date: 04/28/2024 CLINICAL DATA:  Nasogastric tube placement. EXAM: ABDOMEN - 1 VIEW COMPARISON:  KUB 04/19/2024, chest x-ray 04/26/2024 FINDINGS: Nasogastric tube is present with tip over the stomach in the left upper quadrant and side-port in the region of the gastroesophageal junction. This could be advanced approximately 5-6 cm. Several air-filled mildly dilated centralized small bowel loops are present. No free peritoneal air. Bilateral pulmonary patchy airspace process without significant change. Suggestion of mild bilateral pleural fluid. IMPRESSION: 1. Nasogastric tube with tip over the stomach in the left upper quadrant and side-port in the region  of the gastroesophageal junction. This could be advanced approximately 5-6 cm. 2. Several air-filled mildly dilated centralized small bowel loops which may be due to ileus versus partial small bowel obstruction. 3. Persistent bilateral patchy airspace process with suggestion of mild bilateral pleural fluid. Electronically Signed   By: Toribio Agreste M.D.   On: 04/28/2024 11:17   DG CHEST PORT 1 VIEW Result Date: 04/28/2024 CLINICAL DATA:  Hypoxia.  EXAM: PORTABLE CHEST 1 VIEW COMPARISON:  04/26/2024 FINDINGS: Lungs are adequately inflated demonstrate persistent bilateral patchy airspace process left worse than right without significant change. Possible small amount of bilateral pleural fluid. Cardiomediastinal silhouette and remainder of the exam is unchanged. IMPRESSION: Persistent bilateral patchy airspace process left worse than right without significant change. Possible small amount of bilateral pleural fluid. Electronically Signed   By: Toribio Agreste M.D.   On: 04/28/2024 09:21   DG Chest Port 1 View Result Date: 04/26/2024 CLINICAL DATA:  Hypoxia. EXAM: PORTABLE CHEST 1 VIEW COMPARISON:  04/25/2024 FINDINGS: Patchy and nodular airspace disease is seen bilaterally with areas of apparent cystic or cavitary change. The cardio pericardial silhouette is enlarged. No acute bony abnormality. Telemetry leads overlie the chest. IMPRESSION: Patchy and nodular airspace disease bilaterally with areas of apparent cystic or cavitary change. No substantial interval change. Electronically Signed   By: Camellia Candle M.D.   On: 04/26/2024 07:28   VAS US  LOWER EXTREMITY VENOUS (DVT) Result Date: 04/25/2024  Lower Venous DVT Study Patient Name:  DENE NAZIR [facility]  Date of Exam:   04/25/2024 Medical Rec #: [id number]         Accession #:    [id number] Date of Birth: Dec 26, 1975         Patient Gender: M Patient Age:   60 years Exam Location:  [facility] Procedure:      VAS US  LOWER EXTREMITY VENOUS (DVT) Referring Phys: STEPHANIE REESE --------------------------------------------------------------------------------  Indications: Edema.  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 Comparison Study: No prior study on file Performing Technologist: Alberta Lis RVS  Examination Guidelines: A complete evaluation includes B-mode imaging, spectral Doppler, color Doppler, and power Doppler as needed of all accessible portions of each vessel. Bilateral  testing is considered an integral part of a complete examination. Limited examinations for reoccurring indications may be performed as noted. The reflux portion of the exam is performed with the patient in reverse Trendelenburg.  +---------+---------------+---------+-----------+----------+--------------+ RIGHT    CompressibilityPhasicitySpontaneityPropertiesThrombus Aging +---------+---------------+---------+-----------+----------+--------------+ CFV      Full           Yes      Yes                                 +---------+---------------+---------+-----------+----------+--------------+ SFJ      Full                                                        +---------+---------------+---------+-----------+----------+--------------+ FV Prox  Full           Yes      Yes                                 +---------+---------------+---------+-----------+----------+--------------+ FV Mid   Full                                                        +---------+---------------+---------+-----------+----------+--------------+  FV DistalFull                                                        +---------+---------------+---------+-----------+----------+--------------+ PFV      Full           Yes      Yes                                 +---------+---------------+---------+-----------+----------+--------------+ POP      Full           Yes      Yes                                 +---------+---------------+---------+-----------+----------+--------------+ PTV      Full                                                        +---------+---------------+---------+-----------+----------+--------------+ PERO     Full                                                        +---------+---------------+---------+-----------+----------+--------------+ Gastroc  Full                                                         +---------+---------------+---------+-----------+----------+--------------+   +---------+---------------+---------+-----------+----------+--------------+ LEFT     CompressibilityPhasicitySpontaneityPropertiesThrombus Aging +---------+---------------+---------+-----------+----------+--------------+ CFV      Full           Yes      Yes                                 +---------+---------------+---------+-----------+----------+--------------+ SFJ      Full                                                        +---------+---------------+---------+-----------+----------+--------------+ FV Prox  Full                                                        +---------+---------------+---------+-----------+----------+--------------+ FV Mid   Full                                                        +---------+---------------+---------+-----------+----------+--------------+  FV DistalFull           Yes      Yes                                 +---------+---------------+---------+-----------+----------+--------------+ PFV      Full                                                        +---------+---------------+---------+-----------+----------+--------------+ POP      Full           Yes      Yes                                 +---------+---------------+---------+-----------+----------+--------------+ PTV      Full                                                        +---------+---------------+---------+-----------+----------+--------------+ PERO     Full                                                        +---------+---------------+---------+-----------+----------+--------------+ Gastroc  Full                                                        +---------+---------------+---------+-----------+----------+--------------+     Summary: RIGHT: - There is no evidence of deep vein thrombosis in the lower extremity. However, portions of this  examination were limited- see technologist comments above.  - No cystic structure found in the popliteal fossa. Interstitial edema noted throughout the extremity  LEFT: - There is no evidence of deep vein thrombosis in the lower extremity.  - No cystic structure found in the popliteal fossa. Interstitial edema noted throughout the extremity.  *See table(s) above for measurements and observations. Electronically signed by Fonda Rim on 04/25/2024 at 4:34:13 PM.    Final    DG CHEST PORT 1 VIEW Result Date: 04/25/2024 EXAM: 1 VIEW(S) XRAY OF THE CHEST 04/25/2024 01:13:00 PM COMPARISON: CT dated 04/23/2024. CLINICAL HISTORY: Dyspnea FINDINGS: LINES, TUBES AND DEVICES: Multiple wires and leads project over the chest on the frontal radiograph. LUNGS AND PLEURA: Slightly improved left sided aeration. Increased right sided patchy airspace disease with underlying cavitary nodules. Suspicious small left pleural effusion. Mild right hemidiaphragm elevation. No pneumothorax. HEART AND MEDIASTINUM: Mild cardiomegaly. BONES AND SOFT TISSUES: No acute osseous abnormality. IMPRESSION: 1. Improved left and worsened right sided aeration. Multifocal pneumonia and cavitary nodules persist. 2. Possible  small left pleural effusion. Electronically signed by: Rockey Kilts MD 04/25/2024 01:56 PM EST RP Workstation: [id number]   CT CERVICAL SPINE WO CONTRAST Result Date: 04/25/2024 CLINICAL DATA:  Initial evaluation for neck trauma. EXAM: CT CERVICAL SPINE  WITHOUT CONTRAST TECHNIQUE: Multidetector CT imaging of the cervical spine was performed without intravenous contrast. Multiplanar CT image reconstructions were also generated. RADIATION DOSE REDUCTION: This exam was performed according to the departmental dose-optimization program which includes automated exposure control, adjustment of the mA and/or kV according to patient size and/or use of iterative reconstruction technique. COMPARISON:  Prior MRI from 04/23/2024. FINDINGS:  Alignment: Examination degraded by motion artifact, limiting assessment. Straightening of the normal cervical lordosis.  No listhesis. Skull base and vertebrae: Again seen is abnormal widening of the atlantodental interval measuring up to 10 mm. [place] a an dens interval also widened up to 15 mm. Findings related to previously identified septic arthritis seen on prior MRI. Associated mild basilar invagination better appreciated on prior MRI. No other evidence for osteomyelitis discitis or septic arthritis elsewhere within the cervical spine by CT. There is an apparent oblique lucency traversing the right articular facet of T1 (series 7, image 47). While this finding is favored to reflect a nutrient foramen and/or artifact on this motion degraded exam, a possible subtle acute nondisplaced fracture is difficult to exclude. No other visible acute fracture. Vertebral body height maintained. No worrisome osseous lesions. Soft tissues and spinal canal: Paraspinous soft tissues demonstrate no acute abnormality. Right thyroid lobe hypoplastic and/or absent. No visible epidural collections. Disc levels: No significant cervical spondylosis for age. No appreciable spinal stenosis. Upper chest: Few scattered patchy and nodular densities within the visualized lungs, likely related to known history of pneumonia and/or septic emboli. Other: None. IMPRESSION: 1. Motion degraded exam. 2. Abnormal widening of the atlantodental and basion-dens intervals secondary to acute septic arthritis, stable from prior MRI. 3. Apparent oblique lucency traversing the right articular facet of T1 as above. While this finding is favored to reflect a nutrient foramen and/or artifact on this motion degraded exam, a possible subtle acute nondisplaced fracture is difficult to exclude (has there been a traumatic event since recent MRI from 1 day prior?). Correlation with physical exam for possible pain at this location recommended. Correlation with repeat MRI  could be performed for further evaluation as warranted. 4. Few scattered patchy and nodular densities within the visualized lungs, likely related to known history of pneumonia and/or septic emboli. Electronically Signed   By: Morene Hoard M.D.   On: 04/25/2024 02:30   MR CERVICAL SPINE W WO CONTRAST Addendum Date: 04/24/2024 ADDENDUM REPORT: 04/24/2024 02:22 ADDENDUM: Findings regarding the skull base infection and potential resultant craniocervical instability discussed by telephone with the nurse practitioner covering for the patient, Stefano Horns at 1:50 a.m. on 04/24/2024. Neurosurgical consultation was recommended. Electronically Signed   By: Morene Hoard M.D.   On: 04/24/2024 02:22   Result Date: 04/24/2024 CLINICAL DATA:  Initial evaluation for bacteremia, acute neck pain. EXAM: MRI CERVICAL SPINE WITHOUT AND WITH CONTRAST TECHNIQUE: Multiplanar and multiecho pulse sequences of the cervical spine, to include the craniocervical junction and cervicothoracic junction, were obtained without and with intravenous contrast. CONTRAST:  7mL GADAVIST  GADOBUTROL  1 MMOL/ML IV SOLN COMPARISON:  None Available. FINDINGS: Alignment: Examination degraded by motion artifact. Straightening with slight reversal of the normal cervical lordosis. No listhesis. Vertebrae: Abnormal widening of the atlantodental interval which measures up to 10 mm in diameter. Enhancing soft tissue within the expanded atlantodental space, extending towards the basion. The basion dens interval is also abnormally widened up to 16 mm. Secondary mild basilar invagination of the dens into the ventral brainstem. Finding concerning for acute infection with septic arthritis. Associated intra-articular fluid collection  at the left aspect of the C1-2 articulation measures up to 2.1 cm (series 21, image 9). No significant marrow edema within the adjacent osseous structures to suggest associated osteomyelitis. No other evidence for acute  infection elsewhere within the cervical spine. Vertebral body height maintained without acute or chronic fracture. Decreased T1 signal intensity noted throughout the visualized bone marrow, nonspecific, but most commonly related to anemia, smoking or obesity. No worrisome osseous lesions. No other abnormal marrow edema or enhancement. Cord: Normal signal and morphology. Mild epidural enhancement adjacent to the infected C1-2 level, but no epidural abscess or collection. Posterior Fossa, vertebral arteries, paraspinal tissues: Visualized brain and posterior fossa otherwise unremarkable. Craniocervical junction remains patent. Edema and enhancement at the skull base about the infected atlantodental articulation. Paraspinous soft tissues demonstrate no other acute finding. Normal flow voids seen within the vertebral arteries bilaterally. Disc levels: No significant disc pathology seen within the cervical spine for age. No significant disc bulge or focal disc herniation. No significant facet disease. No canal or neural foraminal stenosis. No neural impingement. IMPRESSION: 1. Findings consistent with acute septic arthritis involving the atlantodental articulation at the skull base. Secondary abnormal widening of the atlantodental and basion-dens intervals with mild basilar invagination as above. Superimposed 2.1 cm intra-articular fluid collection at the left aspect of the atlantodental articulation. 2. No other evidence for acute infection elsewhere within the cervical spine. 3. No significant disc pathology for age. No stenosis or neural impingement. Electronically Signed: By: Morene Hoard M.D. On: 04/24/2024 01:23   MR Lumbar Spine W Wo Contrast Result Date: 04/24/2024 CLINICAL DATA:  Initial evaluation for acute back pain, bacteremia. EXAM: MRI LUMBAR SPINE WITHOUT AND WITH CONTRAST TECHNIQUE: Multiplanar and multiecho pulse sequences of the lumbar spine were obtained without and with intravenous  contrast. CONTRAST:  7mL GADAVIST  GADOBUTROL  1 MMOL/ML IV SOLN COMPARISON:  Comparison made with prior MRI from 03/15/2024. FINDINGS: Segmentation: Transitional features about the lumbosacral junction. For the purposes of this dictation, the same numbering system utilized on prior exam is employed on this exam. Therefore, there is a partially lumbarized S1 segment with rudimentary S1-2 interspace. Alignment: Mild levoscoliosis. Alignment otherwise normal preservation of the normal lumbar lordosis. Trace retrolisthesis of L3 on L4, stable. Vertebrae: Persistent findings of osteomyelitis discitis at L3-4. There has been progressive endplate irregularity and fragmentation, with slight vertebral body height loss since previous. Mild epidural enhancement within the adjacent ventral epidural space without discrete epidural abscess. Associated paraspinous inflammatory changes involving the left greater than right psoas musculature. Superimposed abscess within the left psoas measures approximately 2.1 x 1.7 x 8.3 cm (series 8, image 20). Persistent findings of osteomyelitis discitis at L5-S1, greater on the right. Mildly progressive endplate irregularity and enhancement without significant interval vertebral collapse. No significant epidural involvement at this level. Edema and enhancement about the right L5-S1 facet, consistent with ongoing sub septic arthritis, progressed from prior. Associated loculated soft tissue abscess just posterior to the infected right L5-S1 facet measures 1.9 x 1.4 x 2.0 cm (series 12, image 24). Abnormal edema and enhancement also seen about the left L3-4 facet, also consistent with septic arthritis, present on prior exam in retrospect. Associated 1.1 cm soft tissue abscess within the adjacent posterior paraspinous soft tissues (series 12, image 12). No other new sites of infection elsewhere within the lumbar spine. Vertebral body height otherwise maintained. Diffusely decreased T1 signal  intensity throughout the visualized bone marrow, nonspecific, but most commonly related to anemia, smoking, or obesity. No worrisome osseous  lesions. Conus medullaris and cauda equina: Conus extends to the L1-2 level. Conus and cauda equina appear normal. Paraspinal and other soft tissues: Paraspinous inflammatory changes as detailed above. Disc levels: L1-2:  Unremarkable. L2-3: Disc desiccation with mild disc bulge. No significant spinal stenosis. Foramina remain patent. L3-4: Findings related to osteomyelitis discitis with associated intervertebral disc space height loss, endplate irregularity, and spurring. Underlying diffuse disc bulge, asymmetric to the left. Mild bilateral facet hypertrophy. No significant spinal stenosis. Moderate bilateral L3 foraminal narrowing. L4-5: Disc desiccation with minimal annular disc bulge. Superimposed right foraminal disc protrusion closely approximates the right L4 nerve root. Mild left facet hypertrophy. No significant spinal stenosis. Foramina remain patent. L5-S1: Ongoing findings of osteomyelitis discitis. Underlying mild disc bulge with endplate spurring. Right-sided septic arthritis with underlying facet degeneration. No significant spinal stenosis. Moderate bilateral L5 foraminal narrowing. S1-2: Rudimentary S1-2 interspace. No disc bulge or focal disc herniation. Minimal facet degeneration. No stenosis. IMPRESSION: 1. Persistent findings of osteomyelitis discitis at L3-4 and L5-S1, mildly progressed as compared to previous MRI from 03/15/2024. Associated paraspinous inflammatory changes with superimposed left psoas abscess as above. No significant epidural involvement at this time. 2. Septic arthritis involving the right L5-S1 facet, progressed from prior. Associated 2 cm soft tissue abscess within the adjacent posterior paraspinous soft tissues. 3. Septic arthritis involving the left L3-4 facet, present on prior exam in retrospect. Associated 1.1 cm soft tissue  abscess within the adjacent posterior paraspinous soft tissues. 4. Underlying multilevel degenerative spondylosis and facet arthrosis as above. No significant spinal stenosis. Moderate bilateral L3 and L5 foraminal narrowing. 5. Transitional lumbosacral anatomy. Careful correlation with numbering system on this exam recommended prior to any potential future intervention. Electronically Signed   By: Morene Hoard M.D.   On: 04/24/2024 02:18   MR THORACIC SPINE W WO CONTRAST Result Date: 04/24/2024 CLINICAL DATA:  Initial evaluation for bacteremia, acute back pain. EXAM: MRI THORACIC WITHOUT AND WITH CONTRAST TECHNIQUE: Multiplanar and multiecho pulse sequences of the thoracic spine were obtained without and with intravenous contrast. CONTRAST:  7mL GADAVIST  GADOBUTROL  1 MMOL/ML IV SOLN COMPARISON:  Comparison made with CT from 04/14/2024. FINDINGS: Alignment:  Examination degraded by motion artifact. Mild dextroscoliosis. Alignment otherwise normal preservation of the normal thoracic kyphosis. No listhesis. Vertebrae: Vertebral body height maintained without acute or chronic fracture. Diffusely decreased T1 signal intensity noted throughout the visualized bone marrow, nonspecific, but most commonly related to anemia, smoking or obesity. No worrisome osseous lesions. Intervertebral disc space height loss with fluid signal intensity within the T5-6 interspace. Marrow edema and enhancement within the adjacent endplates with mild endplate irregularity. Findings consistent with acute osteomyelitis discitis at this level. No significant epidural or paraspinous involvement. No other evidence for acute infection elsewhere within the thoracic spine. Cord: Grossly normal signal and morphology. No visible cord signal changes on this motion degraded exam. No epidural abscess. No abnormal enhancement. Paraspinal and other soft tissues: Mild diffuse edema within the visualized paraspinous soft tissues, likely related to  overall volume status. No loculated collections. Small layering left pleural effusion. Consolidative opacity within the partially visualized lungs, concerning for multifocal pneumonia. Disc levels: T6-7: Small left paracentral disc protrusion without stenosis. T8-9: Small left paracentral disc protrusion without stenosis. Otherwise, no other significant disc pathology seen within the thoracic spine. No stenosis or IMPRESSION: 1. Findings consistent with acute osteomyelitis discitis at T5-6. No significant epidural or paraspinous involvement at this time. 2. Consolidative opacities within the partially visualized lungs, concerning for multifocal pneumonia.  Associated small left pleural effusion. 3. Small disc protrusions at T6-7 and T8-9 without stenosis or impingement. Electronically Signed   By: Morene Hoard M.D.   On: 04/24/2024 01:43   DG CHEST PORT 1 VIEW Result Date: 04/23/2024 CLINICAL DATA:  Dyspnea. EXAM: PORTABLE CHEST 1 VIEW COMPARISON:  04/19/2024 FINDINGS: The cardio pericardial silhouette is enlarged. Interval progression of patchy airspace disease in the left lung, now with confluent areas in the left mid and retrocardiac left base. Subtle patchy airspace disease again noted right lung. No substantial pleural effusion. No acute bony abnormality. IMPRESSION: Interval progression of patchy airspace disease in the left lung, now with confluent areas in the left mid lung and retrocardiac left base. Electronically Signed   By: Camellia Candle M.D.   On: 04/23/2024 12:16     PHYSICAL EXAM  Temp:  [97.2 F (36.2 C)-98 F (36.7 C)] 97.5 F (36.4 C) (12/11 0800) Pulse Rate:  [54-113] 60 (12/11 1015) Resp:  [0-26] 12 (12/11 1015) BP: (76-151)/(54-111) 122/97 (12/11 1015) SpO2:  [89 %-99 %] 95 % (12/11 1015) Arterial Line BP: (65-168)/(38-103) 134/77 (12/11 1015) FiO2 (%):  [40 %] 40 % (12/11 0738) Weight:  [83.8 kg] 83.8 kg (12/11 0245)  Neuro exam: General - Intubated, ill  appearing. GCS 3.   Pupils are dilated and not reflexive to light, corneal stimulation, or threat. Cold chloride stimulation test negative. Dolls eyes present.   Reflexes: Not responsive to pain truncally or to any extremity. Babinski negative  Motor Tone - Muscle tone was assessed at the neck and appendages and was normal.   ASSESSMENT/PLAN Mr. BLAYKE CORDREY is a 49 y.o. male being treated for infective endocarditis, septic pulmonary emboli, psoas abscess, and who spine osteomyelitis who had sudden onset of left gaze, aphasia, and flaccid R paralysis. 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 Hemorrhage not expected to be survivable.   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 Serial CTs showed progressive ICH, IVH, hydrocephalus and brain herniation Currently clinically brain-dead as Apnea test pending HgA1c: 4.6 Disposition: Family is en route to visit patient and for GOC discussion  Hypertension BP has been stable during hospitalization Pt BP has been well monitored in the ICU BP goal 120-140   Hospital day # 39  D. Penne Mori, DO, PGY1 Neurology Stroke Team  06/05/2024 11:35 AM  ATTENDING NOTE: I reviewed above note and agree with the assessment and plan. Pt was seen and examined.   Patient experienced devastating ICH, IVH, hydrocephalus and brain herniation.  Serial CT showed progressive ICH, hydrocephalus and brain herniation even status post EVD placement.  On exam patient clinically brain death, no brainstem reflexes, balloon pupils.  Pending apnea test.  Family coming this morning for final visit.  For detailed assessment and plan, please refer to above as I have made  changes wherever appropriate.   Neurology will sign off. Please call with questions. Thanks for the consult.   Ary Cummins, MD PhD Stroke Neurology 06/08/2024 5:49 PM    To contact Stroke Continuity provider, please refer to [email or web address]. After hours, contact General Neurology

## 2024-06-13 NOTE — Progress Notes (Incomplete)
 Patient ID: [name] [name], male   DOB: 1976/05/10, 49 y.o.   MRN: [id number]    Progress Note from the Palliative Medicine Team at [facility]   Patient Name: [name] [name]        Date: 05/25/2024 DOB: 08-Aug-1975  Age: 49 y.o. MRN#: [id number] Attending Physician: [name] Toribio BROCKS, MD Primary Care Physician: [name] Satterfield, MD Admit Date: 04/14/2024   Reason for Consultation/Follow-up   Establishing Goals of Care   HPI/ Brief Hospital Review 49 year old man who presented to [facility] ED 11/2 with AMS. PMHx significant for MSSA bacteremia, MSSA PNA, infective endocarditis, cervical through lumbar osteomyelitis, psoas abscess, severe protein calorie malnutrition/FTT, substance abuse. Recent admission 10/2 - 10/13 for sepsis, widespread MSSA.   Arrived to [facility] ED covered in feces, altered and with poor airway protection; he was subsequently intubated. CXR demonstrated RLL opacities. Confirmed bilateral septic pulmonary emboli, tricuspid valve endocarditis.  Required intubation and mechanical ventilation. Clinically progressed and was transferred out of ICU 11/10.  Day 37 of this hospitalization.  PMT involved for goals of care   Subjective  Extensive chart review has been completed prior to meeting with patient/family  including labs, vital signs, imaging, progress/consult notes, orders, medications and available advance directive documents.    This NP assessed patient at the bedside as a follow up for palliative medicine needs and emotional support.  No family at bedside.  Education offered on the seriousness of his current medical situation, and the likely long-term projected trajectory in the context of multiple comorbidities.  Discussed that, despite full medical support the overall outcomes remain uncertain.  Education offered on patient's high risk of decompensation secondary to underlying comorbidities, infection, immobility, and trach.     [name] [name] is alert and talkative  this morning.  He is doing really well with his Passy-Muir valve, he can communication much simpler  Again created space for [name] [name] to contemplate his current medical situation and ongoing pending healthcare decisions.  Patient is hopeful for ongoing improvement, transition to SNF for rehab and ultimate hope is to return to his home.    Education offered today regarding  the importance of continued conversation with family and their  medical providers regarding overall plan of care and treatment options,  ensuring decisions are within the context of the patients values and GOCs.  Encouraged patient to consider documentation of H POA, and living will wishes.  His circle consists of his significant other of 17 years/[name] [name], his mother who currently is in ill health and at a skilled nursing facility and a 38 year old son.  He struggles with naming anyone person to be his decision maker  PMT will continue to support holistically  Questions and concerns addressed   Discussed with primary team and nursing staff  Time:  35  minutes   Detailed review of medical records ( labs, imaging, vital signs), medically appropriate exam ( MS, skin, cardiac,  resp)   discussed with treatment team, counseling and education to patient, family, staff, documenting clinical information, medication management, coordination of care          Late entry: Dr. [name] informed me of patient's dramatic change secondary to severe extension of hemorrhages.  Attempted to contact [name] [name] by telephone without success.  Will reach out again tomorrow   [name] Plants NP  Palliative Medicine Team Team Phone # [phone] Pager [phone]

## 2024-06-13 NOTE — Progress Notes (Signed)
 OT Cancellation Note  Patient Details Name: [name] [name] MRN: [id number] DOB: 08/15/1975   Cancelled Treatment:    Reason Eval/Treat Not Completed: Medical issues which prohibited therapy- pt not appropriate for OT at this time.  OT to sign off.    Etta NOVAK, OT Acute Rehabilitation Services Office [phone] Secure Chat Preferred    Etta GORMAN [place] 06/02/2024, 7:48 AM

## 2024-06-13 DEATH — deceased
